# Patient Record
Sex: Female | Born: 1940 | Race: Black or African American | Hispanic: No | Smoking: Former smoker
Health system: Southern US, Community
[De-identification: ages and names within clinical notes are randomized; demographics above are authoritative.]

## PROBLEM LIST (undated history)

## (undated) DIAGNOSIS — E669 Obesity, unspecified: Secondary | ICD-10-CM

## (undated) DIAGNOSIS — G4730 Sleep apnea, unspecified: Secondary | ICD-10-CM

## (undated) DIAGNOSIS — Z860100 Personal history of colon polyps, unspecified: Secondary | ICD-10-CM

## (undated) DIAGNOSIS — I8390 Asymptomatic varicose veins of unspecified lower extremity: Secondary | ICD-10-CM

## (undated) DIAGNOSIS — E7800 Pure hypercholesterolemia, unspecified: Secondary | ICD-10-CM

## (undated) DIAGNOSIS — I10 Essential (primary) hypertension: Secondary | ICD-10-CM

## (undated) DIAGNOSIS — R059 Cough, unspecified: Secondary | ICD-10-CM

## (undated) DIAGNOSIS — Q0700 Arnold-Chiari syndrome without spina bifida or hydrocephalus: Secondary | ICD-10-CM

## (undated) DIAGNOSIS — R05 Cough: Secondary | ICD-10-CM

## (undated) DIAGNOSIS — I2510 Atherosclerotic heart disease of native coronary artery without angina pectoris: Secondary | ICD-10-CM

## (undated) DIAGNOSIS — K449 Diaphragmatic hernia without obstruction or gangrene: Secondary | ICD-10-CM

## (undated) DIAGNOSIS — K219 Gastro-esophageal reflux disease without esophagitis: Secondary | ICD-10-CM

## (undated) DIAGNOSIS — J449 Chronic obstructive pulmonary disease, unspecified: Secondary | ICD-10-CM

## (undated) DIAGNOSIS — J45909 Unspecified asthma, uncomplicated: Secondary | ICD-10-CM

## (undated) DIAGNOSIS — R7611 Nonspecific reaction to tuberculin skin test without active tuberculosis: Secondary | ICD-10-CM

## (undated) DIAGNOSIS — I872 Venous insufficiency (chronic) (peripheral): Secondary | ICD-10-CM

## (undated) DIAGNOSIS — M1990 Unspecified osteoarthritis, unspecified site: Secondary | ICD-10-CM

## (undated) DIAGNOSIS — Z8669 Personal history of other diseases of the nervous system and sense organs: Secondary | ICD-10-CM

## (undated) HISTORY — DX: Unspecified osteoarthritis, unspecified site: M19.90

## (undated) HISTORY — PX: ABDOMINAL HYSTERECTOMY: SHX81

## (undated) HISTORY — DX: Arnold-Chiari syndrome without spina bifida or hydrocephalus: Q07.00

## (undated) HISTORY — DX: Gastro-esophageal reflux disease without esophagitis: K21.9

## (undated) HISTORY — DX: Chronic obstructive pulmonary disease, unspecified: J44.9

## (undated) HISTORY — DX: Asymptomatic varicose veins of unspecified lower extremity: I83.90

## (undated) HISTORY — DX: Pure hypercholesterolemia, unspecified: E78.00

## (undated) HISTORY — DX: Sleep apnea, unspecified: G47.30

## (undated) HISTORY — DX: Personal history of other diseases of the nervous system and sense organs: Z86.69

## (undated) HISTORY — DX: Nonspecific reaction to tuberculin skin test without active tuberculosis: R76.11

## (undated) HISTORY — DX: Obesity, unspecified: E66.9

## (undated) HISTORY — DX: Personal history of colon polyps, unspecified: Z86.0100

## (undated) HISTORY — DX: Personal history of colonic polyps: Z86.010

## (undated) HISTORY — PX: TONSILLECTOMY: SUR1361

## (undated) HISTORY — DX: Venous insufficiency (chronic) (peripheral): I87.2

## (undated) HISTORY — DX: Atherosclerotic heart disease of native coronary artery without angina pectoris: I25.10

## (undated) HISTORY — DX: Essential (primary) hypertension: I10

## (undated) HISTORY — DX: Unspecified asthma, uncomplicated: J45.909

---

## 1991-12-02 HISTORY — PX: OTHER SURGICAL HISTORY: SHX169

## 1998-02-20 ENCOUNTER — Ambulatory Visit (HOSPITAL_COMMUNITY): Admission: RE | Admit: 1998-02-20 | Discharge: 1998-02-20 | Payer: Self-pay | Admitting: Pulmonary Disease

## 1998-04-07 ENCOUNTER — Emergency Department (HOSPITAL_COMMUNITY): Admission: EM | Admit: 1998-04-07 | Discharge: 1998-04-07 | Payer: Self-pay | Admitting: Emergency Medicine

## 1998-12-24 ENCOUNTER — Other Ambulatory Visit: Admission: RE | Admit: 1998-12-24 | Discharge: 1998-12-24 | Payer: Self-pay | Admitting: Obstetrics and Gynecology

## 1999-02-25 ENCOUNTER — Ambulatory Visit (HOSPITAL_COMMUNITY): Admission: RE | Admit: 1999-02-25 | Discharge: 1999-02-25 | Payer: Self-pay | Admitting: Pulmonary Disease

## 2000-01-20 ENCOUNTER — Other Ambulatory Visit: Admission: RE | Admit: 2000-01-20 | Discharge: 2000-01-20 | Payer: Self-pay | Admitting: Obstetrics and Gynecology

## 2000-02-27 ENCOUNTER — Encounter: Payer: Self-pay | Admitting: Obstetrics and Gynecology

## 2000-02-27 ENCOUNTER — Ambulatory Visit (HOSPITAL_COMMUNITY): Admission: RE | Admit: 2000-02-27 | Discharge: 2000-02-27 | Payer: Self-pay | Admitting: Obstetrics and Gynecology

## 2001-01-25 ENCOUNTER — Other Ambulatory Visit: Admission: RE | Admit: 2001-01-25 | Discharge: 2001-01-25 | Payer: Self-pay | Admitting: Obstetrics and Gynecology

## 2001-03-29 ENCOUNTER — Encounter: Payer: Self-pay | Admitting: Specialist

## 2001-04-05 ENCOUNTER — Ambulatory Visit (HOSPITAL_COMMUNITY): Admission: RE | Admit: 2001-04-05 | Discharge: 2001-04-05 | Payer: Self-pay | Admitting: Specialist

## 2001-05-12 ENCOUNTER — Ambulatory Visit (HOSPITAL_COMMUNITY): Admission: RE | Admit: 2001-05-12 | Discharge: 2001-05-12 | Payer: Self-pay | Admitting: Obstetrics and Gynecology

## 2001-05-12 ENCOUNTER — Encounter: Payer: Self-pay | Admitting: Obstetrics and Gynecology

## 2002-01-27 ENCOUNTER — Other Ambulatory Visit: Admission: RE | Admit: 2002-01-27 | Discharge: 2002-01-27 | Payer: Self-pay | Admitting: Obstetrics and Gynecology

## 2002-05-16 ENCOUNTER — Encounter: Payer: Self-pay | Admitting: Obstetrics and Gynecology

## 2002-05-16 ENCOUNTER — Ambulatory Visit (HOSPITAL_COMMUNITY): Admission: RE | Admit: 2002-05-16 | Discharge: 2002-05-16 | Payer: Self-pay | Admitting: Obstetrics and Gynecology

## 2004-01-17 ENCOUNTER — Inpatient Hospital Stay (HOSPITAL_COMMUNITY): Admission: AD | Admit: 2004-01-17 | Discharge: 2004-01-19 | Payer: Self-pay | Admitting: *Deleted

## 2004-10-01 ENCOUNTER — Ambulatory Visit: Payer: Self-pay | Admitting: *Deleted

## 2004-11-20 ENCOUNTER — Ambulatory Visit: Payer: Self-pay | Admitting: *Deleted

## 2004-11-29 ENCOUNTER — Ambulatory Visit: Payer: Self-pay | Admitting: Pulmonary Disease

## 2004-12-03 ENCOUNTER — Ambulatory Visit: Payer: Self-pay | Admitting: *Deleted

## 2004-12-17 ENCOUNTER — Ambulatory Visit: Payer: Self-pay | Admitting: *Deleted

## 2005-03-18 ENCOUNTER — Ambulatory Visit: Payer: Self-pay | Admitting: Pulmonary Disease

## 2005-06-24 ENCOUNTER — Ambulatory Visit: Payer: Self-pay | Admitting: Pulmonary Disease

## 2005-06-26 ENCOUNTER — Ambulatory Visit: Payer: Self-pay | Admitting: Internal Medicine

## 2005-07-22 ENCOUNTER — Ambulatory Visit: Payer: Self-pay | Admitting: *Deleted

## 2005-07-30 ENCOUNTER — Encounter: Admission: RE | Admit: 2005-07-30 | Discharge: 2005-07-30 | Payer: Self-pay | Admitting: Surgery

## 2005-08-11 ENCOUNTER — Ambulatory Visit: Payer: Self-pay

## 2005-08-18 ENCOUNTER — Ambulatory Visit: Payer: Self-pay

## 2005-09-15 ENCOUNTER — Ambulatory Visit: Payer: Self-pay | Admitting: Pulmonary Disease

## 2006-01-14 ENCOUNTER — Ambulatory Visit: Payer: Self-pay | Admitting: *Deleted

## 2006-01-19 ENCOUNTER — Ambulatory Visit: Payer: Self-pay | Admitting: *Deleted

## 2006-01-19 ENCOUNTER — Ambulatory Visit: Payer: Self-pay | Admitting: Pulmonary Disease

## 2006-03-10 ENCOUNTER — Ambulatory Visit: Payer: Self-pay | Admitting: *Deleted

## 2006-05-07 ENCOUNTER — Encounter: Admission: RE | Admit: 2006-05-07 | Discharge: 2006-05-07 | Payer: Self-pay | Admitting: Occupational Medicine

## 2006-05-08 ENCOUNTER — Ambulatory Visit: Payer: Self-pay | Admitting: *Deleted

## 2006-06-22 ENCOUNTER — Ambulatory Visit: Payer: Self-pay | Admitting: Pulmonary Disease

## 2006-07-15 ENCOUNTER — Ambulatory Visit: Payer: Self-pay | Admitting: *Deleted

## 2006-07-23 ENCOUNTER — Emergency Department (HOSPITAL_COMMUNITY): Admission: EM | Admit: 2006-07-23 | Discharge: 2006-07-23 | Payer: Self-pay | Admitting: Emergency Medicine

## 2006-10-20 ENCOUNTER — Ambulatory Visit: Payer: Self-pay | Admitting: Pulmonary Disease

## 2006-12-31 ENCOUNTER — Ambulatory Visit: Payer: Self-pay | Admitting: Pulmonary Disease

## 2007-03-31 ENCOUNTER — Ambulatory Visit: Payer: Self-pay | Admitting: Pulmonary Disease

## 2007-04-05 ENCOUNTER — Ambulatory Visit: Payer: Self-pay | Admitting: Pulmonary Disease

## 2007-04-05 LAB — CONVERTED CEMR LAB
ALT: 17 units/L (ref 0–40)
AST: 21 units/L (ref 0–37)
Albumin: 3.1 g/dL — ABNORMAL LOW (ref 3.5–5.2)
Alkaline Phosphatase: 79 units/L (ref 39–117)
BUN: 7 mg/dL (ref 6–23)
Bacteria, UA: NEGATIVE
Basophils Absolute: 0 10*3/uL (ref 0.0–0.1)
Basophils Relative: 0.4 % (ref 0.0–1.0)
Bilirubin, Direct: 0.2 mg/dL (ref 0.0–0.3)
CO2: 30 meq/L (ref 19–32)
Calcium: 9 mg/dL (ref 8.4–10.5)
Chloride: 109 meq/L (ref 96–112)
Cholesterol: 115 mg/dL (ref 0–200)
Creatinine, Ser: 0.7 mg/dL (ref 0.4–1.2)
Crystals: NEGATIVE
Eosinophils Absolute: 0.1 10*3/uL (ref 0.0–0.6)
Eosinophils Relative: 2.3 % (ref 0.0–5.0)
GFR calc Af Amer: 108 mL/min
GFR calc non Af Amer: 90 mL/min
Glucose, Bld: 90 mg/dL (ref 70–99)
HCT: 36.1 % (ref 36.0–46.0)
HDL: 49.5 mg/dL (ref >39.0)
Hemoglobin, Urine: NEGATIVE
Hemoglobin: 12 g/dL (ref 12.0–15.0)
Ketones, ur: NEGATIVE mg/dL
LDL Cholesterol: 49 mg/dL (ref 0–99)
Lymphocytes Relative: 43.7 % (ref 12.0–46.0)
MCHC: 33.3 g/dL (ref 30.0–36.0)
MCV: 79.6 fL (ref 78.0–100.0)
Monocytes Absolute: 0.4 10*3/uL (ref 0.2–0.7)
Monocytes Relative: 10.4 % (ref 3.0–11.0)
Mucus, UA: NEGATIVE
Neutro Abs: 1.9 10*3/uL (ref 1.4–7.7)
Neutrophils Relative %: 43.2 % (ref 43.0–77.0)
Nitrite: NEGATIVE
Platelets: 209 10*3/uL (ref 150–400)
Potassium: 4.1 meq/L (ref 3.5–5.1)
RBC: 4.53 M/uL (ref 3.87–5.11)
RDW: 13.6 % (ref 11.5–14.6)
Sodium: 146 meq/L — ABNORMAL HIGH (ref 135–145)
Specific Gravity, Urine: 1.02 (ref 1.000–1.03)
TSH: 2.46 microintl units/mL (ref 0.35–5.50)
Total Bilirubin: 0.5 mg/dL (ref 0.3–1.2)
Total CHOL/HDL Ratio: 2.3
Total Protein, Urine: NEGATIVE mg/dL
Total Protein: 6.4 g/dL (ref 6.0–8.3)
Triglycerides: 83 mg/dL (ref 0–149)
Urine Glucose: NEGATIVE mg/dL
Urobilinogen, UA: 0.2 (ref 0.0–1.0)
VLDL: 17 mg/dL (ref 0–40)
WBC: 4.3 10*3/uL — ABNORMAL LOW (ref 4.5–10.5)
pH: 6 (ref 5.0–8.0)

## 2007-09-27 ENCOUNTER — Ambulatory Visit: Payer: Self-pay | Admitting: Pulmonary Disease

## 2007-11-01 ENCOUNTER — Ambulatory Visit: Payer: Self-pay | Admitting: Vascular Surgery

## 2007-12-02 HISTORY — PX: OTHER SURGICAL HISTORY: SHX169

## 2007-12-06 ENCOUNTER — Encounter (INDEPENDENT_AMBULATORY_CARE_PROVIDER_SITE_OTHER): Payer: Self-pay | Admitting: Specialist

## 2007-12-06 ENCOUNTER — Ambulatory Visit (HOSPITAL_BASED_OUTPATIENT_CLINIC_OR_DEPARTMENT_OTHER): Admission: RE | Admit: 2007-12-06 | Discharge: 2007-12-06 | Payer: Self-pay | Admitting: Specialist

## 2007-12-27 ENCOUNTER — Ambulatory Visit: Payer: Self-pay | Admitting: Cardiology

## 2008-01-05 ENCOUNTER — Ambulatory Visit: Payer: Self-pay | Admitting: Cardiology

## 2008-01-05 LAB — CONVERTED CEMR LAB
BUN: 9 mg/dL (ref 6–23)
CO2: 31 meq/L (ref 19–32)
Calcium: 9.4 mg/dL (ref 8.4–10.5)
Chloride: 102 meq/L (ref 96–112)
Creatinine, Ser: 0.7 mg/dL (ref 0.4–1.2)
GFR calc Af Amer: 108 mL/min
GFR calc non Af Amer: 90 mL/min
Glucose, Bld: 95 mg/dL (ref 70–99)
Potassium: 4.1 meq/L (ref 3.5–5.1)
Sodium: 140 meq/L (ref 135–145)

## 2008-01-14 DIAGNOSIS — M545 Low back pain, unspecified: Secondary | ICD-10-CM | POA: Insufficient documentation

## 2008-01-14 DIAGNOSIS — E669 Obesity, unspecified: Secondary | ICD-10-CM | POA: Insufficient documentation

## 2008-01-14 DIAGNOSIS — Z8669 Personal history of other diseases of the nervous system and sense organs: Secondary | ICD-10-CM | POA: Insufficient documentation

## 2008-01-14 DIAGNOSIS — D126 Benign neoplasm of colon, unspecified: Secondary | ICD-10-CM | POA: Insufficient documentation

## 2008-01-14 DIAGNOSIS — E7800 Pure hypercholesterolemia, unspecified: Secondary | ICD-10-CM | POA: Insufficient documentation

## 2008-01-14 DIAGNOSIS — Z6836 Body mass index (BMI) 36.0-36.9, adult: Secondary | ICD-10-CM | POA: Insufficient documentation

## 2008-01-14 DIAGNOSIS — I2510 Atherosclerotic heart disease of native coronary artery without angina pectoris: Secondary | ICD-10-CM | POA: Insufficient documentation

## 2008-01-14 DIAGNOSIS — K219 Gastro-esophageal reflux disease without esophagitis: Secondary | ICD-10-CM | POA: Insufficient documentation

## 2008-01-14 DIAGNOSIS — M1990 Unspecified osteoarthritis, unspecified site: Secondary | ICD-10-CM | POA: Insufficient documentation

## 2008-01-14 DIAGNOSIS — G9050 Complex regional pain syndrome I, unspecified: Secondary | ICD-10-CM | POA: Insufficient documentation

## 2008-01-14 DIAGNOSIS — I872 Venous insufficiency (chronic) (peripheral): Secondary | ICD-10-CM | POA: Insufficient documentation

## 2008-01-17 ENCOUNTER — Ambulatory Visit: Payer: Self-pay | Admitting: Pulmonary Disease

## 2008-01-17 DIAGNOSIS — I10 Essential (primary) hypertension: Secondary | ICD-10-CM | POA: Insufficient documentation

## 2008-02-11 ENCOUNTER — Ambulatory Visit: Payer: Self-pay

## 2008-02-11 LAB — CONVERTED CEMR LAB
ALT: 22 units/L (ref 0–35)
AST: 30 units/L (ref 0–37)
Albumin: 3.2 g/dL — ABNORMAL LOW (ref 3.5–5.2)
Alkaline Phosphatase: 79 units/L (ref 39–117)
Bilirubin, Direct: 0.2 mg/dL (ref 0.0–0.3)
Cholesterol: 126 mg/dL (ref 0–200)
HDL: 56.4 mg/dL (ref >39.0)
LDL Cholesterol: 49 mg/dL (ref 0–99)
Total Bilirubin: 0.5 mg/dL (ref 0.3–1.2)
Total CHOL/HDL Ratio: 2.2
Total Protein: 6.6 g/dL (ref 6.0–8.3)
Triglycerides: 104 mg/dL (ref 0–149)
VLDL: 21 mg/dL (ref 0–40)

## 2008-02-16 ENCOUNTER — Telehealth (INDEPENDENT_AMBULATORY_CARE_PROVIDER_SITE_OTHER): Payer: Self-pay | Admitting: *Deleted

## 2008-02-21 ENCOUNTER — Ambulatory Visit: Payer: Self-pay | Admitting: Cardiology

## 2008-02-23 ENCOUNTER — Encounter: Payer: Self-pay | Admitting: Pulmonary Disease

## 2008-02-23 ENCOUNTER — Encounter (INDEPENDENT_AMBULATORY_CARE_PROVIDER_SITE_OTHER): Payer: Self-pay | Admitting: *Deleted

## 2008-03-27 ENCOUNTER — Telehealth: Payer: Self-pay | Admitting: Pulmonary Disease

## 2008-03-31 ENCOUNTER — Ambulatory Visit: Payer: Self-pay | Admitting: Pulmonary Disease

## 2008-04-09 DIAGNOSIS — Q054 Unspecified spina bifida with hydrocephalus: Secondary | ICD-10-CM | POA: Insufficient documentation

## 2008-04-10 ENCOUNTER — Telehealth (INDEPENDENT_AMBULATORY_CARE_PROVIDER_SITE_OTHER): Payer: Self-pay | Admitting: *Deleted

## 2008-05-02 ENCOUNTER — Emergency Department (HOSPITAL_COMMUNITY): Admission: EM | Admit: 2008-05-02 | Discharge: 2008-05-02 | Payer: Self-pay | Admitting: Emergency Medicine

## 2008-05-17 ENCOUNTER — Ambulatory Visit: Payer: Self-pay | Admitting: Pulmonary Disease

## 2008-05-22 ENCOUNTER — Encounter: Payer: Self-pay | Admitting: Pulmonary Disease

## 2008-05-31 HISTORY — PX: OTHER SURGICAL HISTORY: SHX169

## 2008-06-12 ENCOUNTER — Inpatient Hospital Stay (HOSPITAL_COMMUNITY): Admission: RE | Admit: 2008-06-12 | Discharge: 2008-06-16 | Payer: Self-pay | Admitting: Specialist

## 2008-07-21 ENCOUNTER — Ambulatory Visit: Payer: Self-pay | Admitting: Pulmonary Disease

## 2008-07-21 ENCOUNTER — Inpatient Hospital Stay (HOSPITAL_COMMUNITY): Admission: EM | Admit: 2008-07-21 | Discharge: 2008-07-24 | Payer: Self-pay | Admitting: Emergency Medicine

## 2008-07-24 ENCOUNTER — Telehealth: Payer: Self-pay | Admitting: Pulmonary Disease

## 2008-07-25 ENCOUNTER — Telehealth (INDEPENDENT_AMBULATORY_CARE_PROVIDER_SITE_OTHER): Payer: Self-pay | Admitting: *Deleted

## 2008-07-28 ENCOUNTER — Telehealth (INDEPENDENT_AMBULATORY_CARE_PROVIDER_SITE_OTHER): Payer: Self-pay | Admitting: *Deleted

## 2008-07-31 ENCOUNTER — Telehealth (INDEPENDENT_AMBULATORY_CARE_PROVIDER_SITE_OTHER): Payer: Self-pay | Admitting: *Deleted

## 2008-08-01 ENCOUNTER — Telehealth: Payer: Self-pay | Admitting: Pulmonary Disease

## 2008-08-08 ENCOUNTER — Telehealth (INDEPENDENT_AMBULATORY_CARE_PROVIDER_SITE_OTHER): Payer: Self-pay | Admitting: *Deleted

## 2008-08-15 ENCOUNTER — Ambulatory Visit: Payer: Self-pay | Admitting: Pulmonary Disease

## 2008-08-23 ENCOUNTER — Encounter: Payer: Self-pay | Admitting: Pulmonary Disease

## 2008-09-05 ENCOUNTER — Encounter (INDEPENDENT_AMBULATORY_CARE_PROVIDER_SITE_OTHER): Payer: Self-pay | Admitting: Specialist

## 2008-09-05 ENCOUNTER — Ambulatory Visit (HOSPITAL_COMMUNITY): Admission: RE | Admit: 2008-09-05 | Discharge: 2008-09-05 | Payer: Self-pay | Admitting: Specialist

## 2008-09-05 ENCOUNTER — Ambulatory Visit: Payer: Self-pay | Admitting: Vascular Surgery

## 2008-09-18 ENCOUNTER — Telehealth (INDEPENDENT_AMBULATORY_CARE_PROVIDER_SITE_OTHER): Payer: Self-pay | Admitting: *Deleted

## 2008-09-18 ENCOUNTER — Ambulatory Visit: Payer: Self-pay | Admitting: Pulmonary Disease

## 2008-09-21 ENCOUNTER — Encounter: Payer: Self-pay | Admitting: Pulmonary Disease

## 2008-11-01 ENCOUNTER — Telehealth (INDEPENDENT_AMBULATORY_CARE_PROVIDER_SITE_OTHER): Payer: Self-pay | Admitting: *Deleted

## 2008-11-02 ENCOUNTER — Ambulatory Visit: Payer: Self-pay | Admitting: Pulmonary Disease

## 2008-11-02 DIAGNOSIS — R252 Cramp and spasm: Secondary | ICD-10-CM | POA: Insufficient documentation

## 2008-11-07 ENCOUNTER — Telehealth (INDEPENDENT_AMBULATORY_CARE_PROVIDER_SITE_OTHER): Payer: Self-pay | Admitting: *Deleted

## 2008-11-13 LAB — CONVERTED CEMR LAB
BUN: 11 mg/dL (ref 6–23)
CO2: 31 meq/L (ref 19–32)
Calcium: 9.1 mg/dL (ref 8.4–10.5)
Chloride: 108 meq/L (ref 96–112)
Creatinine, Ser: 0.7 mg/dL (ref 0.4–1.2)
GFR calc Af Amer: 108 mL/min
GFR calc non Af Amer: 89 mL/min
Glucose, Bld: 86 mg/dL (ref 70–99)
Potassium: 4.5 meq/L (ref 3.5–5.1)
Sodium: 143 meq/L (ref 135–145)

## 2008-12-19 ENCOUNTER — Ambulatory Visit: Payer: Self-pay | Admitting: Pulmonary Disease

## 2009-02-12 ENCOUNTER — Telehealth (INDEPENDENT_AMBULATORY_CARE_PROVIDER_SITE_OTHER): Payer: Self-pay | Admitting: *Deleted

## 2009-02-21 ENCOUNTER — Ambulatory Visit: Payer: Self-pay | Admitting: Cardiology

## 2009-02-21 ENCOUNTER — Encounter: Payer: Self-pay | Admitting: Cardiology

## 2009-02-21 LAB — CONVERTED CEMR LAB
ALT: 22 units/L (ref 0–35)
AST: 23 units/L (ref 0–37)
Albumin: 3 g/dL — ABNORMAL LOW (ref 3.5–5.2)
Alkaline Phosphatase: 85 units/L (ref 39–117)
BUN: 8 mg/dL (ref 6–23)
Bilirubin, Direct: 0 mg/dL (ref 0.0–0.3)
CO2: 31 meq/L (ref 19–32)
Calcium: 8.8 mg/dL (ref 8.4–10.5)
Chloride: 108 meq/L (ref 96–112)
Cholesterol: 152 mg/dL (ref 0–200)
Creatinine, Ser: 0.6 mg/dL (ref 0.4–1.2)
GFR calc non Af Amer: 128.6 mL/min (ref >60)
Glucose, Bld: 87 mg/dL (ref 70–99)
HDL: 77.3 mg/dL (ref >39.00)
LDL Cholesterol: 55 mg/dL (ref 0–99)
Potassium: 4.1 meq/L (ref 3.5–5.1)
Sodium: 144 meq/L (ref 135–145)
Total Bilirubin: 0.6 mg/dL (ref 0.3–1.2)
Total CHOL/HDL Ratio: 2
Total Protein: 6.5 g/dL (ref 6.0–8.3)
Triglycerides: 97 mg/dL (ref 0.0–149.0)
VLDL: 19.4 mg/dL (ref 0.0–40.0)

## 2009-04-18 ENCOUNTER — Ambulatory Visit: Payer: Self-pay | Admitting: Pulmonary Disease

## 2009-04-18 DIAGNOSIS — J449 Chronic obstructive pulmonary disease, unspecified: Secondary | ICD-10-CM | POA: Insufficient documentation

## 2009-04-19 DIAGNOSIS — J309 Allergic rhinitis, unspecified: Secondary | ICD-10-CM | POA: Insufficient documentation

## 2009-05-15 ENCOUNTER — Encounter: Payer: Self-pay | Admitting: Pulmonary Disease

## 2009-05-28 ENCOUNTER — Encounter: Payer: Self-pay | Admitting: Pulmonary Disease

## 2009-09-04 ENCOUNTER — Ambulatory Visit: Payer: Self-pay | Admitting: Pulmonary Disease

## 2009-09-05 ENCOUNTER — Telehealth (INDEPENDENT_AMBULATORY_CARE_PROVIDER_SITE_OTHER): Payer: Self-pay | Admitting: *Deleted

## 2009-11-01 ENCOUNTER — Telehealth (INDEPENDENT_AMBULATORY_CARE_PROVIDER_SITE_OTHER): Payer: Self-pay | Admitting: *Deleted

## 2009-11-01 ENCOUNTER — Ambulatory Visit: Payer: Self-pay | Admitting: Pulmonary Disease

## 2009-11-01 ENCOUNTER — Telehealth: Payer: Self-pay | Admitting: Cardiology

## 2009-11-01 DIAGNOSIS — I498 Other specified cardiac arrhythmias: Secondary | ICD-10-CM | POA: Insufficient documentation

## 2009-11-01 DIAGNOSIS — R001 Bradycardia, unspecified: Secondary | ICD-10-CM | POA: Insufficient documentation

## 2009-11-01 DIAGNOSIS — I82409 Acute embolism and thrombosis of unspecified deep veins of unspecified lower extremity: Secondary | ICD-10-CM | POA: Insufficient documentation

## 2009-11-01 DIAGNOSIS — R079 Chest pain, unspecified: Secondary | ICD-10-CM | POA: Insufficient documentation

## 2009-11-02 ENCOUNTER — Telehealth: Payer: Self-pay | Admitting: Pulmonary Disease

## 2009-11-02 ENCOUNTER — Ambulatory Visit: Payer: Self-pay | Admitting: Cardiology

## 2009-11-02 DIAGNOSIS — R011 Cardiac murmur, unspecified: Secondary | ICD-10-CM | POA: Insufficient documentation

## 2009-11-15 ENCOUNTER — Telehealth (INDEPENDENT_AMBULATORY_CARE_PROVIDER_SITE_OTHER): Payer: Self-pay | Admitting: *Deleted

## 2009-11-20 ENCOUNTER — Ambulatory Visit: Payer: Self-pay

## 2009-11-20 ENCOUNTER — Ambulatory Visit: Payer: Self-pay | Admitting: Cardiology

## 2009-11-20 ENCOUNTER — Encounter: Payer: Self-pay | Admitting: Cardiology

## 2009-11-20 ENCOUNTER — Ambulatory Visit (HOSPITAL_COMMUNITY): Admission: RE | Admit: 2009-11-20 | Discharge: 2009-11-20 | Payer: Self-pay | Admitting: Cardiology

## 2009-11-20 ENCOUNTER — Encounter (HOSPITAL_COMMUNITY): Admission: RE | Admit: 2009-11-20 | Discharge: 2009-11-27 | Payer: Self-pay | Admitting: Cardiology

## 2009-11-21 LAB — CONVERTED CEMR LAB
ALT: 15 units/L (ref 0–35)
AST: 23 units/L (ref 0–37)
Albumin: 3.3 g/dL — ABNORMAL LOW (ref 3.5–5.2)
Alkaline Phosphatase: 95 units/L (ref 39–117)
BUN: 7 mg/dL (ref 6–23)
Bilirubin, Direct: 0 mg/dL (ref 0.0–0.3)
CO2: 29 meq/L (ref 19–32)
Calcium: 8.9 mg/dL (ref 8.4–10.5)
Chloride: 105 meq/L (ref 96–112)
Cholesterol: 127 mg/dL (ref 0–200)
Creatinine, Ser: 0.7 mg/dL (ref 0.4–1.2)
GFR calc non Af Amer: 107.39 mL/min (ref >60)
Glucose, Bld: 98 mg/dL (ref 70–99)
HDL: 52.4 mg/dL (ref >39.00)
LDL Cholesterol: 55 mg/dL (ref 0–99)
Potassium: 4.3 meq/L (ref 3.5–5.1)
Sodium: 142 meq/L (ref 135–145)
Total Bilirubin: 0.5 mg/dL (ref 0.3–1.2)
Total CHOL/HDL Ratio: 2
Total Protein: 6.8 g/dL (ref 6.0–8.3)
Triglycerides: 100 mg/dL (ref 0.0–149.0)
VLDL: 20 mg/dL (ref 0.0–40.0)

## 2009-12-10 ENCOUNTER — Ambulatory Visit: Payer: Self-pay | Admitting: Pulmonary Disease

## 2010-01-15 ENCOUNTER — Encounter: Admission: RE | Admit: 2010-01-15 | Discharge: 2010-01-15 | Payer: Self-pay | Admitting: Infectious Diseases

## 2010-02-22 ENCOUNTER — Telehealth (INDEPENDENT_AMBULATORY_CARE_PROVIDER_SITE_OTHER): Payer: Self-pay | Admitting: *Deleted

## 2010-02-22 ENCOUNTER — Ambulatory Visit: Payer: Self-pay | Admitting: Internal Medicine

## 2010-02-26 ENCOUNTER — Telehealth (INDEPENDENT_AMBULATORY_CARE_PROVIDER_SITE_OTHER): Payer: Self-pay | Admitting: *Deleted

## 2010-02-27 ENCOUNTER — Ambulatory Visit: Payer: Self-pay | Admitting: Pulmonary Disease

## 2010-03-14 ENCOUNTER — Telehealth: Payer: Self-pay | Admitting: Adult Health

## 2010-05-17 ENCOUNTER — Telehealth (INDEPENDENT_AMBULATORY_CARE_PROVIDER_SITE_OTHER): Payer: Self-pay | Admitting: *Deleted

## 2010-05-31 ENCOUNTER — Encounter: Payer: Self-pay | Admitting: Pulmonary Disease

## 2010-06-10 ENCOUNTER — Ambulatory Visit: Payer: Self-pay | Admitting: Pulmonary Disease

## 2010-06-11 DIAGNOSIS — F17200 Nicotine dependence, unspecified, uncomplicated: Secondary | ICD-10-CM | POA: Insufficient documentation

## 2010-06-25 ENCOUNTER — Ambulatory Visit: Payer: Self-pay | Admitting: Cardiology

## 2010-08-13 ENCOUNTER — Telehealth (INDEPENDENT_AMBULATORY_CARE_PROVIDER_SITE_OTHER): Payer: Self-pay | Admitting: *Deleted

## 2010-08-13 ENCOUNTER — Telehealth: Payer: Self-pay | Admitting: Pulmonary Disease

## 2010-08-13 DIAGNOSIS — N950 Postmenopausal bleeding: Secondary | ICD-10-CM | POA: Insufficient documentation

## 2010-09-20 ENCOUNTER — Ambulatory Visit: Payer: Self-pay | Admitting: Pulmonary Disease

## 2010-09-23 ENCOUNTER — Ambulatory Visit: Payer: Self-pay | Admitting: Pulmonary Disease

## 2010-10-07 LAB — CONVERTED CEMR LAB
ALT: 17 units/L (ref 0–35)
AST: 21 units/L (ref 0–37)
Albumin: 3.3 g/dL — ABNORMAL LOW (ref 3.5–5.2)
Alkaline Phosphatase: 96 units/L (ref 39–117)
BUN: 10 mg/dL (ref 6–23)
Basophils Absolute: 0 10*3/uL (ref 0.0–0.1)
Basophils Relative: 0.5 % (ref 0.0–3.0)
Bilirubin, Direct: 0.1 mg/dL (ref 0.0–0.3)
CO2: 28 meq/L (ref 19–32)
Calcium: 8.9 mg/dL (ref 8.4–10.5)
Chloride: 104 meq/L (ref 96–112)
Cholesterol: 143 mg/dL (ref 0–200)
Creatinine, Ser: 0.7 mg/dL (ref 0.4–1.2)
Eosinophils Absolute: 0.1 10*3/uL (ref 0.0–0.7)
Eosinophils Relative: 3.6 % (ref 0.0–5.0)
GFR calc non Af Amer: 114.65 mL/min (ref >60)
Glucose, Bld: 84 mg/dL (ref 70–99)
HCT: 36.3 % (ref 36.0–46.0)
HDL: 50.6 mg/dL (ref >39.00)
Hemoglobin: 12 g/dL (ref 12.0–15.0)
LDL Cholesterol: 67 mg/dL (ref 0–99)
Lymphocytes Relative: 51.2 % — ABNORMAL HIGH (ref 12.0–46.0)
Lymphs Abs: 2 10*3/uL (ref 0.7–4.0)
MCHC: 33.1 g/dL (ref 30.0–36.0)
MCV: 81.9 fL (ref 78.0–100.0)
Monocytes Absolute: 0.4 10*3/uL (ref 0.1–1.0)
Monocytes Relative: 10.5 % (ref 3.0–12.0)
Neutro Abs: 1.3 10*3/uL — ABNORMAL LOW (ref 1.4–7.7)
Neutrophils Relative %: 34.2 % — ABNORMAL LOW (ref 43.0–77.0)
Platelets: 188 10*3/uL (ref 150.0–400.0)
Potassium: 4.4 meq/L (ref 3.5–5.1)
RBC: 4.44 M/uL (ref 3.87–5.11)
RDW: 15.4 % — ABNORMAL HIGH (ref 11.5–14.6)
Sodium: 140 meq/L (ref 135–145)
TSH: 2.84 microintl units/mL (ref 0.35–5.50)
Total Bilirubin: 0.4 mg/dL (ref 0.3–1.2)
Total CHOL/HDL Ratio: 3
Total Protein: 6.6 g/dL (ref 6.0–8.3)
Triglycerides: 125 mg/dL (ref 0.0–149.0)
VLDL: 25 mg/dL (ref 0.0–40.0)
WBC: 3.9 10*3/uL — ABNORMAL LOW (ref 4.5–10.5)

## 2010-10-28 ENCOUNTER — Telehealth (INDEPENDENT_AMBULATORY_CARE_PROVIDER_SITE_OTHER): Payer: Self-pay | Admitting: *Deleted

## 2010-11-26 ENCOUNTER — Telehealth: Payer: Self-pay | Admitting: Pulmonary Disease

## 2010-12-31 NOTE — Miscellaneous (Signed)
Summary: Orders Update  Clinical Lists Changes  Orders: Added new Service order of No Charge Patient Arrived (NCPA0) (NCPA0) - Signed 

## 2010-12-31 NOTE — Assessment & Plan Note (Signed)
Summary: PAIN IN LEG AND ARM/ MBW  Medications Added PROAIR HFA 108 (90 BASE) MCG/ACT  AERS (ALBUTEROL SULFATE) 1-2 sprays every 6 hours as needed for wheezing... LISINOPRIL-HYDROCHLOROTHIAZIDE 20-12.5 MG  TABS (LISINOPRIL-HYDROCHLOROTHIAZIDE) Take 1 tablet by mouth once a day POTASSIUM CHLORIDE CRYS CR 20 MEQ  TBCR (POTASSIUM CHLORIDE CRYS CR) 1 tab daily as directed... CRESTOR 5 MG  TABS (ROSUVASTATIN CALCIUM) Take 1 tablet by mouth once a day ETODOLAC 400 MG  TABS (ETODOLAC) 1 tab by mouth two times a day w/ food as needed for arthritis pain... HYDROCODONE-ACETAMINOPHEN 5-325 MG  TABS (HYDROCODONE-ACETAMINOPHEN) 1 tab by mouth Q4H as needed for pain...      Allergies Added: NKDA  Chief Complaint:  4 month ROV....  History of Present Illness: 70 y/o BF here for a follow up visit... she underwent a right knee arthroscopy 12/06/07 by Tora Perches and has some rough going after surg... she's been getting therapy at home and walking w/ crutches and notes some right should discomfort now... she will be seeing DrBeane tomorrow and will mention the shoulder pain to him for Rx...  she also saw DrCrenshaw on 12/27/07: she has non-obstructive CAD and her BP was sl elevated prompting him to start LISINOPRIL/Hct 20/12.5 daily... she was also instructed to stop her KCl but she started cramping in the muscles of her legs so she restarted the potassium...    Current Problems:  HYPERTENSION (ICD-401.9) - started on LISINOPRIL/Hct last month- BP=110/70 and feeling Ok, denies HA, fatigue, visual changes, CP, palipit, dizziness, syncope, dyspnea, edema, etc... CAD (ICD-414.00) - known non-obstructive disease... DrCrenshaw plans a screening Myoview soon... VENOUS INSUFFICIENCY (ICD-459.81) - she follows a low salt diet and the Hct helps... HYPERCHOLESTEROLEMIA (ICD-272.0) - on CRESTOR 5mg /d- last FLP 5/08 showed TChol 115, TG 83, HDL 50, LDL 49... tolerating well and taking med regularly. OBESITY (ICD-278.00) -  unable to diet effectively and get weight down... 223#, we discussed diet and exercise program... GERD (ICD-530.81) - EGD 7/05 by DrStark w/ HH- not currently on meds. COLONIC POLYPS (ICD-211.3) - last colonoscopy was 12/03 showing several 3-15mm polyps (hyperplastic)... HX OF GALLSTONE (ICD-V12.79) - seen on CT 7/06 and referred to [facility]- eval by DrCornett and being followed... 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     Vital Signs:  Patient Profile:   70 Years Old Female Weight:  222.8 pounds O2 Sat:      99 % O2 treatment:    Room Air Temp:     98.6 degrees F oral Pulse rate:   49 / minute BP sitting:   108 / 70  (left arm)  Vitals Entered By: Marijo File CMA (January 17, 2008 4:08 PM)                 Physical Exam  WD,  Overweight, 70 y/o BF in NAD... GENERAL:  Alert & oriented; pleasant & cooperative. HEENT:  [place]/AT, EOM-wnl, PERRLA, EACs-clear, TMs-wnl, NOSE-clear, THROAT-clear & wnl. NECK:  Supple w/ fair ROM; no JVD; normal carotid impulses w/o bruits; no thyromegaly or nodules palpated; no lymphadenopathy. CHEST:  Clear to P & A; without wheezes/ rales/ or rhonchi. HEART:  Regular Rhythm, gr 1/6 SEM without rubs or gallops... ABDOMEN:  Obese, soft & nontender; normal bowel sounds; no organomegaly or masses detected. EXT: without deformities, s/p right knee surg, mod arthritic changes; no varicose veins/ +venous insuffic/ tr edema. NEURO:  CN's intact; no focal neuro deficits... DERM:  No lesions noted; no rash etc...       Impression & Recommendations:  Problem # 1:  HYPERTENSION (ICD-401.9) Assessment: Unchanged Controlled... continue med. Her updated medication list for this problem includes:    Lisinopril-hydrochlorothiazide 20-12.5 Mg Tabs (Lisinopril-hydrochlorothiazide) .Marland Kitchen... Take 1 tablet by mouth once a day   Problem # 2:  CAD (ICD-414.00) Assessment: Unchanged Stable... she needs to increase activity as able w/ her knee... f/u w/ DrCrenshaw for Myoview planned soon... Her updated medication list for this problem includes:    Adult Aspirin Ec Low Strength 81 Mg Tbec (Aspirin) .Marland Kitchen... Take 1 tablet by mouth once a day    Lisinopril-hydrochlorothiazide 20-12.5 Mg Tabs (Lisinopril-hydrochlorothiazide) .Marland Kitchen... Take 1 tablet by mouth once a day   Problem # 3:  HYPERCHOLESTEROLEMIA (ICD-272.0) Assessment: Unchanged Donig well on CRESTOR... continue same and recheck fasting labs soon... Her updated medication list for this problem includes:    Crestor 5 Mg Tabs (Rosuvastatin calcium) .Marland Kitchen... Take 1 tablet by mouth once a day   Problem # 4:  GERD (ICD-530.81) Assessment: Unchanged She has a hx of GERD, colon polyps, gallstone... all doing well without symptoms.  Problem # 5:  DEGENERATIVE  JOINT DISEASE (ICD-715.90) f/u w/ DrBeane... in the interim Rx w/ ETODOLAC, VICODIN... Her updated medication list for this problem includes:    Adult Aspirin Ec Low Strength 81 Mg Tbec (Aspirin) .Marland Kitchen... Take 1 tablet by mouth once a day    Etodolac 400 Mg Tabs (Etodolac) .Marland Kitchen... 1 tab by mouth two times a day w/ food as needed for arthritis pain...    Hydrocodone-acetaminophen 5-325 Mg Tabs (Hydrocodone-acetaminophen) .Marland Kitchen... 1 tab by mouth q4h as needed for pain...   Complete Medication List: 1)  Proair Hfa 108 (90 Base) Mcg/act Aers (Albuterol sulfate) .Marland Kitchen.. 1-2 sprays every 6 hours as needed for wheezing... 2)  Adult Aspirin Ec Low Strength 81 Mg Tbec (Aspirin) .... Take 1 tablet by mouth once a day 3)  Lisinopril-hydrochlorothiazide 20-12.5 Mg Tabs (Lisinopril-hydrochlorothiazide) .... Take 1 tablet by mouth once a day 4)  Potassium Chloride Crys Cr 20 Meq Tbcr (Potassium chloride crys cr) .Marland Kitchen.. 1 tab daily as directed... 5)  Crestor 5 Mg Tabs (Rosuvastatin calcium) .... Take 1 tablet by mouth once a day 6)  Etodolac 400 Mg Tabs (Etodolac) .Marland Kitchen.. 1 tab by mouth two times a day w/ food as needed for arthritis pain.Marland KitchenMarland Kitchen 7)  Hydrocodone-acetaminophen 5-325 Mg  Tabs (Hydrocodone-acetaminophen) .Marland Kitchen.. 1 tab by mouth q4h as needed for pain.Marland KitchenMarland Kitchen 8)  Multivitamins Tabs (Multiple vitamin) .... Take 1 tablet by mouth once a day   Patient Instructions: 1)  Today I refilled your PROAIR, ETODOLAC, & VICODIN...  2)  Continue your present medications the same... 3)  Call for any problems.Marland KitchenMarland Kitchen 4)  Please schedule a follow-up appointment in 4 months.    Prescriptions: HYDROCODONE-ACETAMINOPHEN 5-325 MG  TABS (HYDROCODONE-ACETAMINOPHEN) 1 tab by mouth Q4H as needed for pain...  #100 x 5   Entered and Authorized by:   Michele Mcalpine MD   Signed by:   Michele Mcalpine MD on 01/17/2008   Method used:   Print then Give to Patient   RxID:   [id number] ETODOLAC 400 MG  TABS (ETODOLAC) 1 tab by mouth two times a day w/  food as needed for arthritis pain...  #60 x prn   Entered and Authorized by:   Michele Mcalpine MD   Signed by:   Michele Mcalpine MD on 01/17/2008   Method used:   Print then Give to Patient   RxID:   [id number] PROAIR HFA 108 (90 BASE) MCG/ACT  AERS (ALBUTEROL SULFATE) 1-2 sprays every 6 hours as needed for wheezing...  #1 x prn   Entered and Authorized by:   Michele Mcalpine MD   Signed by:   Michele Mcalpine MD on 01/17/2008   Method used:   Print then Give to Patient   RxID:   [id number]  ]

## 2010-12-31 NOTE — Miscellaneous (Signed)
Summary: meds update  Clinical Lists Changes  Medications: Added new medication of TUSSIONEX PENNKINETIC ER 8-10 MG/5ML  LQCR (CHLORPHENIRAMINE-HYDROCODONE) take 1 tsp by mouth every 12 hours as needed cough

## 2010-12-31 NOTE — Progress Notes (Signed)
Summary: [facility] DX  Phone Note From Other Clinic   Summary of Call: Please call [facility] & leave vm to confirm if pt has HTN. 1 [phone]. (message was left w/triage primary care) Initial call taken by: Lamar Sprinkles,  August 01, 2008 3:18 PM  Follow-up for Phone Call        CALLED AND LEFT MESSAGE ON VM FOR SOMEONE TO CALL ME BACK FROM [facility] ABOUT THIS PT. Marijo File CMA  August 04, 2008 5:04 PM   unable to reach anyone at [facility].  have left messages with no response. Marijo File CMA  August 10, 2008 5:10 PM

## 2010-12-31 NOTE — Progress Notes (Signed)
Summary: FORM  Phone Note Call from Patient   Caller: Patient Call For: NADEL Summary of Call: PT NEED FORM FILLED OUT CALL WHEN READY GAVE TO TRIAGE Initial call taken by: Rickard Patience,  May 17, 2010 10:28 AM  Follow-up for Phone Call        lmom that handicap  placard form is signed and in brown folder out front Follow-up by: Philipp Deputy CMA,  May 17, 2010 3:05 PM

## 2010-12-31 NOTE — Assessment & Plan Note (Signed)
Summary: 6 MONTHS/ MBW   Primary Care Provider:  Ledger Heindl  CC:  6 month ROV & review of mult medical problems....  History of Present Illness: 70 y/o BF here for a follow up of her mult med problems including: Allergies, HBP, CAD, Chol, Obesity, etc... she is also followed by DrCrenshaw for Cards, & DrESL for allergies...   ~  December 10, 2009:  she had some CP w/ f/u eval by DrCrenshaw- NEG- see details below... BP under good control, as is the Chol on Cres5mg /d... weight is down 10# but she's had incr difficulty w/ left knee pain & will check w/ DrBeane re: synvisc vs cortisone shot...   ~  June 10, 2010:  she apparently applied for job at [employer] routine PPD was pos- sent to the health dept Tb clinic & CXR was neg> started on INH (rec for 37month course- started 2/11 til 11/11) & they do labs ?monthly... we have not received any records & will request these to be sent to [place] for review... her breathing has been good- no new complaints; BP controlled on meds;  denies CP/ angina but still too sedentary & weight is UP 4#;  asking for new chol med since Cres5 is "too$$"- we discussed trial PRAVACHOL40... finally she notes DrBeane indicated that she needs left TKR as well but she is holding off...   Current Problem List:  ALLERGIC RHINITIS (ICD-477.9) - on NASONEX, ASTEPRO, ZYRTEK, & allergy shots... POSITIVE PPD (ICD-795.5) CHRONIC OBSTRUCTIVE ASTHMA UNSPECIFIED (ICD-493.20) - she had +allergy skin testing for dust mites and started on allergy shots per DrESL along w/ SYMBICORT 160- 2spBid, but she doesn't take anything regularly... +smoker but decreased from 1/2 ppd x 81yrs to 1-2 cig/d.  ~  2/11: she reports +PPD on pre-employment testing> sent to health dept & started on INH 300mg /d x61mo.  ~  CXR 2/11 showed borderline heart size, tort Ao, sl incr markings, NAD...  Hx of HYPERTENSION (ICD-401.9) - off LISINOPRIL/Hct (ALLERGIC to ACE meds) & taking LASIX 20mg /d... BP=112/70 today and  denies HA, fatigue, visual changes, CP, palipit, dizziness, syncope, dyspnea, edema, etc...  CAD (ICD-414.00) - known non-obstructive disease & atypic CP... on ASA 81mg /d, STATIN (Crestor 5), & off ACE.  ~  cath 2/05 showed 20-60% obstructions in all 3 vessels... good LVF...  ~  NuclearStressTest 3/09 was negative- no ischemia or infarction, EF=68%...  ~  ROV w/ DrCrenshaw 3/10 reviewed- contin ASA/ Statin/ smoking cessation/ monitor BP & Chol.  ~  Eval for recurrent CP- EKG showed NSR, NAD;  2DEcho showed mild focal basal septal hypertrophy, norm wall motion w/ EF= 55-65%, mild DD & PAsys= 37;  Myoview NEG- no scar, no ischemia, EF=64%...  VENOUS INSUFFICIENCY (ICD-459.81) - she follows a low salt diet... +LASIX 20mg /d for swelling...  ~  1/10: we discussed no salt, Lasix20, elevation, support hose.  HYPERCHOLESTEROLEMIA (ICD-272.0) - on CRESTOR 5mg /d-   ~  FLP 5/08 on Cres5 showed TChol 115, TG 83, HDL 50, LDL 49... tolerating well and taking med regularly.  ~  FLP 3/09 on Cres5 showed TChol 126, TG 104, HDL 56, LDL 49... rec- same.  ~  FLP 3/10 on Cres5 showed TChol 152, TG 97, HDL 77, LDL 55  ~  FLP 12/10 on Cres5 showed TChol 127, TG 100, HDL 52, LDL 55  ~  7/11:  pt requests change to less expensive statin> try PRAVASTATIN40.  OBESITY (ICD-278.00) - unable to diet effectively and get weight down...   ~  weight 5/10 = 238#, 5\' 5"  tall,  BMI= 40... we discussed diet and exercise program... again!  ~  weight 1/11 = 228#  ~  weight 7/11 = 232#  GERD (ICD-530.81) - EGD 7/05 by DrStark w/ HH- supposed to be on PPI (Omeprazole 20mg ) & H2 blocker (PEPCID 20mg ) at bedtime...  ~  1/10: she stopped above meds & is recommended to restart Rx.  ~  1/11:  still not taking acid suppression meds.  COLONIC POLYPS (ICD-211.3) - last colonoscopy was 12/03 showing several 3-4mm polyps (hyperplastic)...  HX OF GALLSTONE (ICD-V12.79) - seen on CT 7/06 and referred to [facility]- eval by DrCornett and being  followed...  DEGENERATIVE JOINT DISEASE (ICD-715.90) - followed by DrBeane for ortho... Right knee arthroscopy 1/09 without help... s/p right TKR 06/12/08 by Tora Perches... 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 she is off the Etodolac & takes VICODIN Prn...  ~  1/11: now complaining of left knee pain & will f/u w/ DrBeane...  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   Preventive Screening-Counseling & Management  Alcohol-Tobacco     Smoking Status: current     Packs/Day: 1 pack per week  Allergies (verified): No Known Drug Allergies  Comments:  Nurse/Medical Assistant: The patient's medications and allergies were reviewed with the patient and were updated in the Medication and Allergy Lists.  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 mother died age 79 from heart problems and stroke falther  alive age 70  Social History: Reviewed history from 11/01/2009 and no changes required. retired 1 pack per week widowed 2 children  Review of Systems      See HPI       The patient complains of dyspnea on exertion and difficulty walking.  The patient denies anorexia, fever, weight loss, weight gain, vision loss, decreased hearing, hoarseness, chest pain, syncope, peripheral edema, prolonged cough, headaches, hemoptysis, abdominal pain, melena, hematochezia, severe indigestion/heartburn, hematuria, incontinence, muscle weakness, suspicious skin lesions, transient blindness, depression, unusual weight change, abnormal bleeding, enlarged lymph nodes, and angioedema.    Vital Signs:  Patient profile:   70 year old female Height:      65 inches Weight:      232 pounds BMI:     38.75 O2 Sat:      100 % on Room air Temp:     98.6 degrees F oral Pulse rate:   53 / minute BP sitting:   112 / 70  (right arm) Cuff size:   regular  Vitals Entered By: Randell Loop CMA (June 10, 2010 10:29 AM)  O2 Sat at Rest %:  100 O2 Flow:  Room air CC: 6 month ROV & review of mult  medical problems... Is Patient Diabetic? No Pain Assessment Patient in pain? yes      Onset of pain  same leg pain but pt is not ready for surgery   Physical Exam  Additional Exam:  WD, Overweight, 69 y/o BF in NAD... GENERAL:  Alert & oriented; pleasant & cooperative... HEENT:  [name], EOM-wnl, PERRLA, EACs-clear, TMs-wnl, NOSE-clear, THROAT-clear & wnl, no lesions seen; VOICE- sl hoarse. NECK:  Supple w/ decrROM; no JVD; normal carotid impulses w/o bruits; no thyromegaly or nodules palpated; no lymphadenopathy. CHEST:  Clear to P & A; without wheezes/ rales/ or rhonchi heard... HEART:  Regular Rhythm, gr 1/6 SEM without rubs or gallops... ABDOMEN:  Obese, soft & nontender; normal bowel sounds; no organomegaly or masses detected. EXT:  s/p right TKR, mod arthritic changes; scattered  varicose veins/ +venous insuffic/  no  edema... NEURO:  CN's intact; no focal neuro deficits... DERM:  No lesions noted; no rash etc...    Impression & Recommendations:  Problem # 1:  CHRONIC OBSTRUCTIVE ASTHMA UNSPECIFIED (ICD-493.20) Stable on Rx... continue same. Her updated medication list for this problem includes:    Symbicort 160-4.5 Mcg/act Aero (Budesonide-formoterol fumarate) .Marland Kitchen... 2 puffs two times a day    Proair Hfa 108 (90 Base) Mcg/act Aers (Albuterol sulfate) .Marland Kitchen... 1-2 sprays every 6 hours as needed for wheezing...  Problem # 2:  POSITIVE PPD (ICD-795.5) Followed at health dept and has been started on INH... we will send for records...  Problem # 3:  HYPERTENSION (ICD-401.9) Controlled>  same med. Her updated medication list for this problem includes:    Furosemide 20 Mg Tabs (Furosemide) .Marland Kitchen... Take 1 tab by mouth once daily...  Problem # 4:  HYPERCHOLESTEROLEMIA (ICD-272.0) Changed to PRAV40 at her request due to $$ on the Cres5... Her updated medication list for this problem includes:    Pravastatin Sodium 40 Mg Tabs (Pravastatin sodium) .Marland Kitchen... Take 1 tab by mouth at bedtime for cholesterol...  Problem # 5:  OBESITY (ICD-278.00) Diet + exercse> weight reduction is key...  Problem # 6:  DEGENERATIVE JOINT DISEASE (ICD-715.90) She will continue f/u w/ ORTHO DrBeane... Her updated medication list for this problem includes:    Adult Aspirin Ec Low Strength 81 Mg Tbec (Aspirin) .Marland Kitchen... Take 1 tablet by mouth once a day    Hydrocodone-acetaminophen 5-325 Mg Tabs (Hydrocodone-acetaminophen) .Marland Kitchen... 1 tab by mouth every 6-8 h as needed for pain...  Problem # 7:  OTHER MEDICAL PROBLEMS AS NOTED>>>  Complete Medication List: 1)  Nasonex 50 Mcg/act Susp (Mometasone furoate) .... 2 sprays in each nostril two times a day. 2)  Astepro 0.15 % Soln (Azelastine hcl) .... 2 sprays each nostril once daily as needed 3)  Symbicort 160-4.5 Mcg/act Aero (Budesonide-formoterol fumarate) .... 2 puffs two times a day 4)   Proair Hfa 108 (90 Base) Mcg/act Aers (Albuterol sulfate) .Marland Kitchen.. 1-2 sprays every 6 hours as needed for wheezing... 5)  Adult Aspirin Ec Low Strength 81 Mg Tbec (Aspirin) .... Take 1 tablet by mouth once a day 6)  Furosemide 20 Mg Tabs (Furosemide) .... Take 1 tab by mouth once daily.Marland KitchenMarland Kitchen 7)  Potassium Chloride Crys Cr 20 Meq Cr-tabs (Potassium chloride crys cr) .... Take 1 tab by mouth once daily.Marland KitchenMarland Kitchen 8)  Pravastatin Sodium 40 Mg Tabs (Pravastatin sodium) .... Take 1 tab by mouth at bedtime for cholesterol... 9)  Hydrocodone-acetaminophen 5-325 Mg Tabs (Hydrocodone-acetaminophen) .Marland Kitchen.. 1 tab by mouth every 6-8 h as needed for pain... 10)  Skelaxin 800 Mg Tabs (Metaxalone) .Marland KitchenMarland KitchenMarland Kitchen  1 by mouth three times a day as needed muscle spasm 11)  Multivitamins Tabs (Multiple vitamin) .... Take 1 tablet by mouth once a day 12)  Isoniazid 300 Mg Tabs (Isoniazid) .Marland Kitchen.. 1 once daily  Patient Instructions: 1)  Today we updated your med list- see below.... 2)  We decided to change to Crestor to PRAVASTATIN 40mg  to save you $$ on your cholesterol med... 3)  Continue your other meds the same... 4)  Continue your low calorie diet & exercise program... 5)  Call for any problems.Marland KitchenMarland Kitchen 6)  Let's plan a follow up visit in 3-4 months w/ FASTING blood work at that time... Prescriptions: HYDROCODONE-ACETAMINOPHEN 5-325 MG  TABS (HYDROCODONE-ACETAMINOPHEN) 1 tab by mouth every 6-8 H as needed for pain...  #100 x 5   Entered and Authorized by:   Michele Mcalpine MD   Signed by:   Michele Mcalpine MD on 06/10/2010   Method used:   Print then Give to Patient   RxID:   [id number] PRAVASTATIN SODIUM 40 MG TABS (PRAVASTATIN SODIUM) take 1 tab by mouth at bedtime for cholesterol...  #30 x 11   Entered and Authorized by:   Michele Mcalpine MD   Signed by:   Michele Mcalpine MD on 06/10/2010   Method used:   Print then Give to Patient   RxID:   [id number] POTASSIUM CHLORIDE CRYS CR 20 MEQ CR-TABS (POTASSIUM CHLORIDE CRYS CR) take 1 tab  by mouth once daily...  #30 x 11   Entered and Authorized by:   Michele Mcalpine MD   Signed by:   Michele Mcalpine MD on 06/10/2010   Method used:   Print then Give to Patient   RxID:   [id number]

## 2010-12-31 NOTE — Progress Notes (Signed)
Summary: neck & shoulder pain  Phone Note Call from Patient Call back at Home Phone [phone] Call back at [phone]   Caller: Patient Call For: nadel Reason for Call: Talk to Nurse Summary of Call: hurting since Sat. Left side neck and shoulder pain.  No complaints of chest pain or numbness.  Please advise.  Schedulled her appt for 11:45am tomorrow w/TP. Initial call taken by: Eugene Gavia,  February 26, 2010 3:30 PM  Follow-up for Phone Call        called and spoke with pt.  offered her a sooner appt with TP today vs tomorrow.  pt declined and stated she will keep appt for tomorrow.  pt will continue to take advil/tylenol as needed for pain and warm compresses to the site.  pt also aware to go to urgent care/ED if she felt sypmtoms need immediate attention.  Arman Filter LPN  February 26, 2010 3:47 PM

## 2010-12-31 NOTE — Assessment & Plan Note (Signed)
Summary: follow up/ mbw   Chief Complaint:  follow up.  History of Present Illness: 70 y/o BF here for a follow up visit... she has mult med problems as listed below... she went to the ER 05/02/08 w/ atypical CP and was placed on Norflex but she couldn't tolerate this med... she called w/ low BP  ~ 90/40 and DrCrenshaw stopped her Lisinopril/Hct... BP now 122/82 and she is feeling better on less meds... he last f/u w/ cardiology was 02/21/08- reviewed...  Current Problem List;  Hx of HYPERTENSION (ICD-401.9) - off LISINOPRIL/Hct now... BP=122/82 and feeling better- denies HA, fatigue, visual changes, CP, palipit, dizziness, syncope, dyspnea, edema, etc...  CAD (ICD-414.00) - known non-obstructive disease... on ASA 81mg /d, STATIN (Crestor 5), & off ACE.  ~  cath 2/05 showed 20-60% obstructions in all 3 vessels... good LVF...  ~  NuclearStressTest 3/09 was negative- no ischemia or infarction, EF=68%...  VENOUS INSUFFICIENCY (ICD-459.81) - she follows a low salt diet... add LASIX 20mg  as needed.  HYPERCHOLESTEROLEMIA (ICD-272.0) - on CRESTOR 5mg /d-   ~ last FLP 5/08 showed TChol 115, TG 83, HDL 50, LDL 49... tolerating well and taking med regularly.  OBESITY (ICD-278.00) - unable to diet effectively and get weight down... weight = 224#, we discussed diet and exercise program... again!  GERD (ICD-530.81) - EGD 7/05 by DrStark w/ HH- not currently on meds.  COLONIC POLYPS (ICD-211.3) - last colonoscopy was 12/03 showing several 3-10mm polyps (hyperplastic)...  HX OF GALLSTONE (ICD-V12.79) - seen on CT 7/06 and referred to [facility]- eval by DrCornett and being followed...  DEGENERATIVE JOINT DISEASE (ICD-715.90) - followed by DrBeane for ortho... Right knee arthroscopy 1/09 without help... right TKR is scheduled for 06/12/08... 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 she uses ETODOLAC as needed.  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  The patient denies anorexia, fever, weight loss, weight gain, vision loss, decreased hearing, hoarseness, syncope, prolonged cough, headaches, hemoptysis, abdominal pain, melena, hematochezia, severe indigestion/heartburn, hematuria, incontinence, muscle weakness, suspicious skin lesions,  transient blindness, depression, unusual weight change, abnormal bleeding, enlarged lymph nodes, and angioedema.     Vital Signs:  Patient Profile:   70 Years Old Female Weight:      224 pounds O2 Sat:      100 % O2 treatment:    Room Air Temp:     97.1 degrees F oral Pulse rate:   52 / minute BP sitting:   122 / 82  (right arm)  Vitals Entered By: Marijo File CMA (May 17, 2008  10:43 AM)                 Physical Exam  WD, Overweight, 70 y/o BF in NAD... GENERAL:  Alert & oriented; pleasant & cooperative... HEENT:  [place]/AT, EOM-wnl, PERRLA, EACs-clear, TMs-wnl, NOSE-clear, THROAT-clear & wnl. NECK:  Supple w/ decrROM; no JVD; normal carotid impulses w/o bruits; no thyromegaly or nodules palpated; no lymphadenopathy. CHEST:  Clear to P & A; without wheezes/ rales/ or rhonchi. HEART:  Regular Rhythm, gr 1/6 SEM without rubs or gallops... ABDOMEN:  Obese, soft & nontender; normal bowel sounds; no organomegaly or masses detected. EXT:  right patellar bursitis w/ fluid, s/p right knee surg, mod arthritic changes; no varicose veins/ +venous insuffic/ tr edema. NEURO:  CN's intact; no focal neuro deficits... DERM:  No lesions noted; no rash etc...       Impression & Recommendations:  Problem # 1:  HYPERTENSION (ICD-401.9) Assessment: Unchanged Off lisinopril now... follow BP off meds. The following medications were removed from the medication list:    Lisinopril-hydrochlorothiazide 20-12.5 Mg Tabs (Lisinopril-hydrochlorothiazide) .Marland Kitchen... Take 1 tablet by mouth once a day Her updated medication list for this problem includes:    Furosemide 20 Mg Tabs (Furosemide) .Marland Kitchen... Take 1 tab by mouth once daily as needed for swelling that doesn't go down overnight...   Problem # 2:  CAD (ICD-414.00) Assessment: Unchanged Continue other meds, diet efforts, get weight down... Her updated medication list for this problem includes:    Adult Aspirin Ec Low Strength 81 Mg Tbec (Aspirin) .Marland Kitchen... Take 1 tablet by mouth once a day    Furosemide 20 Mg Tabs (Furosemide) .Marland Kitchen... Take 1 tab by mouth once daily as needed for swelling that doesn't go down overnight...   Problem # 3:  VENOUS INSUFFICIENCY (ICD-459.81) Assessment: Unchanged Use Lasix 20mg  as needed...  Problem # 4:  HYPERCHOLESTEROLEMIA (ICD-272.0) Assessment: Unchanged Same med. Her updated medication list for this  problem includes:    Crestor 5 Mg Tabs (Rosuvastatin calcium) .Marland Kitchen... Take 1 tablet by mouth once a day   Problem # 5:  DEGENERATIVE JOINT DISEASE (ICD-715.90) Assessment: Unchanged Surg planned by DrBeane... Her updated medication list for this problem includes:    Adult Aspirin Ec Low Strength 81 Mg Tbec (Aspirin) .Marland Kitchen... Take 1 tablet by mouth once a day    Etodolac 400 Mg Tabs (Etodolac) .Marland Kitchen... 1 tab by mouth two times a day w/ food as needed for arthritis pain...    Hydrocodone-acetaminophen 5-325 Mg Tabs (Hydrocodone-acetaminophen) .Marland Kitchen... 1 tab by mouth q4h as needed for pain...   Complete Medication List: 1)  Proair Hfa 108 (90 Base) Mcg/act Aers (Albuterol sulfate) .Marland Kitchen.. 1-2 sprays every 6 hours as needed for wheezing... 2)  Adult Aspirin Ec Low Strength 81 Mg Tbec (Aspirin) .... Take 1 tablet by mouth once a day 3)  Furosemide 20 Mg Tabs (Furosemide) .... Take 1 tab by mouth once daily as needed for swelling that doesn't go down overnight... 4)  Crestor 5  Mg Tabs (Rosuvastatin calcium) .... Take 1 tablet by mouth once a day 5)  Etodolac 400 Mg Tabs (Etodolac) .Marland Kitchen.. 1 tab by mouth two times a day w/ food as needed for arthritis pain.Marland KitchenMarland Kitchen 6)  Hydrocodone-acetaminophen 5-325 Mg Tabs (Hydrocodone-acetaminophen) .Marland Kitchen.. 1 tab by mouth q4h as needed for pain.Marland KitchenMarland Kitchen 7)  Multivitamins Tabs (Multiple vitamin) .... Take 1 tablet by mouth once a day   Patient Instructions: 1)  Today we updated your med list to reflect the discontinuation of the Lisinopril/Hct and the Potassium meds.Marland KitchenMarland Kitchen  2)  We also wrote a new perscription for Furosemide 20mg  to take in the morning if needed for swelling that doesn't go down overnight... 3)  Call for any problems.Marland KitchenMarland Kitchen  4)  Please schedule a follow-up appointment in 3-4 months.   Prescriptions: FUROSEMIDE 20 MG  TABS (FUROSEMIDE) take 1 tab by mouth once daily as needed for swelling that doesn't go down overnight...  #30 x prn   Entered and Authorized by:   Michele Mcalpine  MD   Signed by:   Michele Mcalpine MD on 05/17/2008   Method used:   Print then Give to Patient   RxID:   [id number]  ]

## 2010-12-31 NOTE — Progress Notes (Signed)
Summary: legs swelling  Phone Note Call from Patient Call back at Home Phone [phone] Call back at [phone]   Caller: Patient Call For: nadel Reason for Call: Acute Illness, Talk to Nurse Summary of Call: spoke w/nurse Friday.  Started fluid pill, but legs are still swelling.  They are ok while propped up but haven't went down since yesterday.  Had them elevated all night but the minute she puits them on they floor they swell. Initial call taken by: Eugene Gavia,  July 31, 2008 8:19 AM  Follow-up for Phone Call        per SN---this swelling is from venous dz.  it will not go away with increase of diuretic.   needs to put on support hose in morning.  no salt, elevate legs, use the support hose, continue the same meds, keep her ov appt with SN. Marijo File CMA  July 31, 2008 11:27 AM   Additional Follow-up for Phone Call Additional follow up Details #1::        pt aware of what sn said, she had already taken extra diuretic for today told pt not to do this again because sn said it would not help, she needs to put support hose on every morning and keep them on until bedtime since she can't tolerate them while she is sleeping, also keep legs elevated as much as possible throughout the day and avoid all salt-pt states she understands also told pt to keep f/u appt with sn Additional Follow-up by: Philipp Deputy CMA,  July 31, 2008 11:36 AM

## 2010-12-31 NOTE — Assessment & Plan Note (Signed)
Summary: follow up/ mbw   Chief Complaint:  3 month ROV....  History of Present Illness: 70 y/o BF here for a follow up of her mult med problems including: Allergies, HBP, CAD, Chol, Obesity, etc...   ~  she went to the ER 05/02/08 w/ atypical CP and was placed on Norflex but she couldn't tolerate this med... she called w/ low BP  ~ 90/40 and DrCrenshaw stopped her Lisinopril/Hct... BP improved to 122/82 and she is felt better on less meds...  ~  somehow she restarted her Lisinopril and ADM 8/21-24/09 w/ resp distress- felt to be an allergic reaction to the ACE- see discharge summary- reviewed...  ~  seen 08/15/08- c/o 1 week hx of nightly throat symptoms including pain/ discomfort/ congestion... every eve ~5pm and also betw 2-3am... she uses hot tea/ lemon/ honey peppermint for relief... denies throat swelling, lip swelling, hoarseness, etc... ? reflux/ LER symptoms- not on PPI Rx- therefore added PROTONIX 40mg  before dinner & RANITIDINE 300mg  Qhs... saw ENT- DrCrossley 9/09- no VC lesions x edema of cords- he ?food allergies...  ~  10/09:  states sl improved but unable to take the Ranitidine at bedtime (too large)... we will change to Pepcid 40mg  Qhs... she wants to have the allergy testing (?food allergies) and we sent her to DrESL for testing- skin testing +dust & given Medrol, Symbicort160, Nasonex, Astepro, Zyrtek, & allergy shots recommended (they apparently did not test for food allergies)...   ~  December 19, 2008:  feeling better on the allergy meds from DrESL... she is getting allergy shots once per week now and states "it helps"... BP under control 120/82 today- and she decreased the Lasix20 to Prn because "I pee too much"... she notes some left leg discomfort x 1 week- lower left leg, dull, comes & goes w/o known aggrevating/ alleviating factors... no redness/ heat/ skin wound or drainage...     Current Problem List:  Hx of HYPERTENSION (ICD-401.9) - off LISINOPRIL/Hct now & taking LASIX  20mg /d... BP=120/82 today and denies HA, fatigue, visual changes, CP, palipit, dizziness, syncope, dyspnea, edema, etc...  CAD (ICD-414.00) - known non-obstructive disease... on ASA 81mg /d, STATIN (Crestor 5), & off ACE.  ~  cath 2/05 showed 20-60% obstructions in all 3 vessels... good LVF...  ~  NuclearStressTest 3/09 was negative- no ischemia or infarction, EF=68%...  VENOUS INSUFFICIENCY (ICD-459.81) - she follows a low salt diet... +LASIX 20mg /d for swelling...  ~  1/10: ** see above ** we discussed no salt, Lasix20, elevation, support hose.  HYPERCHOLESTEROLEMIA (ICD-272.0) - on CRESTOR 5mg /d-   ~ FLP 5/08 showed TChol 115, TG 83, HDL 50, LDL 49... tolerating well and taking med regularly.  ~  FLP 3/09 showed TChol 126, TG 104, HDL 56, LDL 49... rec- same.  OBESITY (ICD-278.00) - unable to diet effectively and get weight down... weight stable at 234#... we discussed diet and exercise program... again!  GERD (ICD-530.81) - EGD 7/05 by DrStark w/ HH- now on PPI (PROTONIX 40mg /d) & H2 blocker (PEPCID 40mg /d) at bedtime...  ~  1/10: she stopped above meds & is recommended to restart Rx.  COLONIC POLYPS (ICD-211.3) - last colonoscopy was 12/03 showing several 3-40mm polyps (hyperplastic)...  HX OF GALLSTONE (ICD-V12.79) - seen on CT 7/06 and referred to [facility]- eval by DrCornett and being followed...  DEGENERATIVE JOINT DISEASE (ICD-715.90) - followed by DrBeane for ortho... Right knee arthroscopy 1/09 without help... s/p right TKR 06/12/08 by Tora Perches... she has signif prepatellar bursitis that requires freq taps.Marland KitchenMarland Kitchen  she is off the Etodolac & takes VICODIN Prn...  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)    LEG CRAMPS, NOCTURNAL (ICD-729.82)      Past Surgical History:        S/P Hysterectomy    S/P CSpine surg in 1993 for Arnold-Chiari Malformation    S/P R knee arthroscopy 1/09 by Tora Perches    S/P right TKR by DrBeane 7/09   Family History:    Reviewed history from 05/17/2008 and no changes required:       mother died age 55 from heart problems and stroke       falther alive age 67  Social History:    Reviewed history from 05/17/2008 and no changes required:       retired       1-3 cigs every now and then       widowed       2 children   Risk Factors:  Tobacco use:  current   Review of Systems       The patient complains of hoarseness and dyspnea on exertion.  The patient denies anorexia, fever, weight loss, weight gain, vision loss, decreased hearing, chest pain, syncope, peripheral edema, prolonged cough, headaches, hemoptysis, abdominal pain, melena, hematochezia, severe indigestion/heartburn, hematuria, incontinence, muscle weakness, suspicious skin lesions, transient blindness, difficulty walking, depression, unusual weight change, abnormal bleeding, enlarged lymph nodes, and angioedema.    MS      Complains of joint pain, joint swelling, and stiffness.      Denies joint redness, loss of strength, low back pain, mid back pain, muscle aches, muscle , cramps, muscle weakness, and thoracic pain.  Allergy      Complains of seasonal  allergies.      Denies hives or rash, itching eyes, persistent infections, and sneezing.   Vital Signs:  Patient Profile:   70 Years Old Female Weight:      234 pounds O2 Sat:      97 % O2 treatment:    Room Air Temp:     97.1 degrees F oral Pulse rate:   54 / minute BP sitting:   120 / 82  (left arm) Cuff size:   regular  Vitals Entered By: Marijo File CMA (December 19, 2008 10:15 AM)             Comments pt brought all of her meds today     Physical Exam  WD, Overweight, 70 y/o BF in NAD... GENERAL:  Alert & oriented; pleasant & cooperative... HEENT:  [place]/AT, EOM-wnl, PERRLA, EACs-clear, TMs-wnl, NOSE-clear, THROAT-clear & wnl, no lesions seen; VOICE- sl hoarse. NECK:  Supple w/  decrROM; no JVD; normal carotid impulses w/o bruits; no thyromegaly or nodules palpated; no lymphadenopathy. CHEST:  Clear to P & A; without wheezes/ rales/ or rhonchi heard... HEART:  Regular Rhythm, gr 1/6 SEM without rubs or gallops... ABDOMEN:  Obese, soft & nontender; normal bowel sounds; no organomegaly or masses detected. EXT:  right patellar bursitis w/ fluid, s/p right TKR, mod arthritic changes; scattered  varicose veins/ +venous insuffic/ no  edema., neg homans sign NEURO:  CN's intact; no focal neuro deficits... DERM:  No lesions noted; no rash etc...        Impression & Recommendations:  Problem # 1:  ALLERGY (ICD-995.3) Feeling better on allergy shots per ESL... continue Symbicort, Singulair, Nasonex, etc...  Problem # 2:  HYPERTENSION (ICD-401.9) Controlled... she is on diet alone w/ Lasix20 Prn... The following medications were removed from the medication list:    Lasix 20 Mg Tabs (Furosemide) .Marland Kitchen... 1 by mouth once daily as needed   Problem # 3:  HYPERCHOLESTEROLEMIA (ICD-272.0) Stable on low dose Crestor... Her updated medication list for this problem includes:    Crestor 5 Mg Tabs (Rosuvastatin calcium) .Marland Kitchen... Take 1 tablet by mouth once a day   Problem # 4:   OBESITY (ICD-278.00) She hasn't lost weight... diet + exercise discussed... get weight down!  Problem # 5:  GERD (ICD-530.81) Advised to restart PPI medication... The following medications were removed from the medication list:    Pantoprazole Sodium 40 Mg Tbec (Pantoprazole sodium) .Marland Kitchen... Take 1 tab by mouth once daily- 30 min before dinner...    Pepcid 40 Mg Tabs (Famotidine) .Marland Kitchen... Take 1 tab by mouth at bedtime...   Problem # 6:  DEGENERATIVE JOINT DISEASE (ICD-715.90) She prefers the Vicodin...  The following medications were removed from the medication list:    Etodolac 400 Mg Tabs (Etodolac) .Marland Kitchen... 1 tab by mouth two times a day w/ food as needed for arthritis pain... Her updated medication list for this problem includes:    Adult Aspirin Ec Low Strength 81 Mg Tbec (Aspirin) .Marland Kitchen... Take 1 tablet by mouth once a day    Hydrocodone-acetaminophen 5-325 Mg Tabs (Hydrocodone-acetaminophen) .Marland Kitchen... 1 tab by mouth q4h as needed for pain...   Problem # 7:  VENOUS INSUFFICIENCY (ICD-459.81) For her leg discomfort I have rec- no salt, elevation, TED's etc...  Complete Medication List: 1)  Symbicort 160-4.5 Mcg/act Aero (Budesonide-formoterol fumarate) .... 2 puffs two times a day 2)  Proair Hfa 108 (90 Base) Mcg/act Aers (Albuterol sulfate) .Marland Kitchen.. 1-2 sprays every 6 hours as needed for wheezing... 3)  Singulair 10 Mg Tabs (Montelukast sodium) .... Take 1 tablet by mouth once a day 4)  Adult Aspirin Ec Low Strength 81 Mg Tbec (Aspirin) .... Take 1 tablet by mouth once a day 5)  Crestor 5 Mg Tabs (Rosuvastatin calcium) .... Take 1 tablet by mouth once a day 6)  Hydrocodone-acetaminophen 5-325 Mg Tabs (Hydrocodone-acetaminophen) .Marland Kitchen.. 1 tab by mouth q4h as needed for pain.Marland KitchenMarland Kitchen 7)  Multivitamins Tabs (Multiple vitamin) .... Take 1 tablet by mouth once a day   Patient Instructions: 1)  Today we updated your med list- see below.... 2)  I'm pleased that you are improved- keep up the good work w/ your  exercise program at the [facility].Marland KitchenMarland Kitchen 3)  Now we need to get on a good diet- low carb, low fat... get your weight down!!! 4)  Call for any problems.Marland KitchenMarland Kitchen 5)  Please schedule a follow-up appointment in 4 months, and we will plan FASTING blood  work on return.Marland KitchenMarland Kitchen

## 2010-12-31 NOTE — Progress Notes (Signed)
Summary: pt having chest pain   Phone Note Call from Patient Call back at Home Phone [phone]   Caller: Patient Reason for Call: Talk to Nurse, Talk to Doctor Summary of Call: pt starting having chest pain near the right breast last night and it has been happening off and on all night Initial call taken by: Omer Jack,  November 01, 2009 8:12 AM  Follow-up for Phone Call        SPOKE WITH PT WHO C/O CHEST PAIN TO THE RIGHT SIDE OF HER CHEST CLOSE TO HER BREAST, NO SOB, NO N/V, NO RADIATION.  LASTED ABOUT 2 TO 3 HOURS RESOLVED ON ITS OWN.  PT DIDN'T TRY TO TAKE ANY TYPE OF MEDICATION TO HELP IT.  STATED SHE PROPPED HERSELF UP IN THE BED AND THAT HELPED SOME.  HER HX DOES SHOW MODERATE CAD FROM A CATH IN 2005 AND A NORMAL MYOVIEW IN [facility] OF 2009.  SHE STATES SHE ALSO HAS A HISTORY OF HIATAL HERNIA AND THAT THIS PAIN REALLY FELT MORE LIKE THAT DID IT THE PAST.  REQUESTED PT CALL MD THAT TREATS HER FOR HER HERNIA AS SHE IS PAIN FEEL AT THIS TIME.  PT IN AGREEMENT. Follow-up by: Charolotte Capuchin, RN,  November 01, 2009 8:31 AM

## 2010-12-31 NOTE — Progress Notes (Signed)
Summary: PAPERS  Phone Note Call from Patient Call back at [phone]   Caller: Patient Call For: NADEL Summary of Call: NEED PAPERS FILLED OUT FOR HOSPITA STAY Initial call taken by: Rickard Patience,  July 25, 2008 10:59 AM  Follow-up for Phone Call        called and spoke with pt. pt states she dropped of [employer] insurance papers to be filled out from recent hosp stay.  I printed off d/c summary and h&p from [facility] and put paperwork on SN's cart.  Cyndia Diver LPN  July 25, 2008 11:26 AM   Additional Follow-up for Phone Call Additional follow up Details #1::        placed papers on SN cart to be filled out and signed. Marijo File CMA  July 25, 2008 4:16 PM    sent papers down to health port to be filled out. Marijo File CMA  July 26, 2008 5:09 PM     Additional Follow-up for Phone Call Additional follow up Details #2::    papers sent back for SN to sign.  the papers are on SN cart. Marijo File CMA  August 09, 2008 10:53 AM   PER SN PAPERS FILLED OUT AND SENT BACK THIS AM -WAS PUT IN P/U FOR [facility]OS[facility] Follow-up by: Marijo File CMA,  August 11, 2008 5:10 PM

## 2010-12-31 NOTE — Progress Notes (Signed)
Summary: GYN referral  Phone Note Call from Patient   Caller: Patient Call For: nadel Summary of Call: pt would like to be referred to a gynecologist Initial call taken by: Rickard Patience,  August 13, 2010 11:31 AM  Follow-up for Phone Call        Pt's GYN is no longer practicing and she is requesting a referral. Per Marliss Czar, okay to do GYN referral. Michel Bickers [facility]  August 13, 2010 11:44 AM  Patient aware referral is being made and PCC's will call her with an appt date and time.Michel Bickers [facility]  August 13, 2010 11:50 AM  New Problems: MENORRHAGIA, POSTMENOPAUSAL (ICD-627.1)   New Problems: MENORRHAGIA, POSTMENOPAUSAL (ICD-627.1)

## 2010-12-31 NOTE — Progress Notes (Signed)
Summary: med  Phone Note Call from Patient Call back at [facility] Phone [phone] Call back at [phone]   Caller: Patient Call For: nadel Reason for Call: Talk to Nurse Summary of Call: pt still coughing and chest pain.  called in meds but it doeawsn't seem to be working. [facility] [address] Initial call taken by: Eugene Gavia,  Apr 10, 2008 9:17 AM  Follow-up for Phone Call        lmtcb Vernie Murders  Apr 10, 2008 10:36 AM  Pt states the Promethazine is not helping. She is having pain across her chest when she coughs and feels like she is having a lot of  nasal drainage down the back of her throat. Please advise.  Follow-up by: Michel Bickers CMA,  Apr 10, 2008 11:12 AM  Additional Follow-up for Phone Call Additional follow up Details #1::        Per Dr. Kriste Basque, pt to discontinue Promethazine and restart Tussinex 4 oz 1 tsp by mouth two times a day as needed cough with 5 refills. [facility] and pt is aware. Additional Follow-up by: Michel Bickers CMA,  Apr 10, 2008 11:32 AM    New/Updated Medications: Sandria Senter ER 8-10 MG/5ML  LQCR (CHLORPHENIRAMINE-HYDROCODONE) 1 tsp by mouth every 12 hours as needed cough   Prescriptions: TUSSIONEX PENNKINETIC ER 8-10 MG/5ML  LQCR (CHLORPHENIRAMINE-HYDROCODONE) 1 tsp by mouth every 12 hours as needed cough  #4 x 5   Entered by:   Michel Bickers CMA   Authorized by:   Michele Mcalpine MD   Signed by:   Michel Bickers CMA on 04/10/2008   Method used:   Telephoned to ...       [facility] [address] [phone]*       [address]       [place], [place]  [address]       Ph: [phone] or [phone]       Fax: [phone]   RxID:   [id number]

## 2010-12-31 NOTE — Assessment & Plan Note (Signed)
Summary: 6 wk rov /ddp   Primary Care Provider:  Robbyn Hodkinson  CC:  8 month ROV & review of mult medical problems... .  History of Present Illness: 70 y/o BF here for a follow up of her mult med problems including: Allergies, HBP, CAD, Chol, Obesity, etc...   ~  ER 05/02/08 w/ atypical CP- Rx Norflex but couldn't tolerate this med... she called w/ low BP  ~ 90/40 and DrCrenshaw stopped her Lisinopril/Hct... BP improved to 122/82 and she is felt better on less meds...  ~  somehow she restarted her Lisinopril and ADM 8/21-24/09 w/ resp distress- felt to be an allergic reaction to the ACE- see discharge summary- reviewed...  ~  Sep09: seen w/ ? reflux/ LER symptoms- added PROTONIX 40mg  before dinner & RANITIDINE 300mg  Qhs... saw ENT- DrCrossley 9/09- no VC lesions x edema of cords.  ~  Oct09:  states sl improved but unable to take the Ranitidine at bedtime (too large)... we will change to Pepcid 40mg  Qhs... she wants to have allergy testing (?food allergies) and we sent her to DrESL for testing- skin testing +dust & given Medrol, Symbicort160, Nasonex, Astepro, Zyrtek, & allergy shots recommended (they apparently did not test for food allergies)...   ~  Jan10:  feeling better on the allergy meds from DrESL... she is getting allergy shots once per week now and states "it helps"... BP under control 120/82 today- and she decreased the Lasix20 to Prn because "I pee too much".   ~  Apr 18, 2009:  Ran out of her meds 2 weeks ago, needs refill perscriptions, she says... weight is up to 238#, which is a BMI= 40 for her 5\' 5"  frame... still smoking 1-2 cig/d... we discussed the importance of diet/ exercise/ weight reduction/ discontinuing all smoking/ and taking your medicines every day!!!   ~  December 10, 2009:  she had some CP w/ f/u eval by DrCrenshaw- NEG- see details below... BP under good control, as is the Chol on Cres5mg /d... weight is down 10# but she's had incr difficulty w/ left knee pain & will check  w/ DrBeane re: synvisc vs cortisone shot...    Current Problem List:  ALLERGIC RHINITIS (ICD-477.9) - on NASONEX, ASTEPRO, ZYRTEK, & allergy shots... CHRONIC OBSTRUCTIVE ASTHMA UNSPECIFIED (ICD-493.20) - she had +allergy skin testing for dust mites and started on allergy shots per DrESL along w/ SYMBICORT 160- 2spBid, but she doesn't take anything regularly... +smoker but decreased from 1/2 ppd x 92yrs to 1-2 cig/d.  ~  5/10:  "I have an appt w/ my asthma doctor soon"  Hx of HYPERTENSION (ICD-401.9) - off LISINOPRIL/Hct (ALLERGIC to ACE meds) & taking LASIX 20mg /d... BP=112/74 today and denies HA, fatigue, visual changes, CP, palipit, dizziness, syncope, dyspnea, edema, etc...  CAD (ICD-414.00) - known non-obstructive disease & atypic CP... on ASA 81mg /d, STATIN (Crestor 5), & off ACE.  ~  cath 2/05 showed 20-60% obstructions in all 3 vessels... good LVF...  ~  NuclearStressTest 3/09 was negative- no ischemia or infarction, EF=68%...  ~  ROV w/ DrCrenshaw 3/10 reviewed- contin ASA/ Statin/ smoking cessation/ monitor BP & Chol.  ~  Eval for recurrent CP- EKG showed NSR, NAD;  2DEcho showed mild focal basal septal hypertrophy, norm wall motion w/ EF= 55-65%, mild DD & PAsys= 37;  Myoview NEG- no scar, no ischemia, EF=64%...  VENOUS INSUFFICIENCY (ICD-459.81) - she follows a low salt diet... +LASIX 20mg /d for swelling...  ~  1/10: we discussed no salt, Lasix20, elevation,  support hose.  HYPERCHOLESTEROLEMIA (ICD-272.0) - on CRESTOR 5mg /d-   ~  FLP 5/08 showed TChol 115, TG 83, HDL 50, LDL 49... tolerating well and taking med regularly.  ~  FLP 3/09 showed TChol 126, TG 104, HDL 56, LDL 49... rec- same.  ~  FLP 3/10 showed TChol 152, TG 97, HDL 77, LDL 55  ~  FLP 12/10 showed TChol 127, TG 100, HDL 52, LDL 55  OBESITY (ICD-278.00) - unable to diet effectively and get weight down...   ~  weight 5/10 = 238#, 5\' 5"  tall,  BMI= 40... we discussed diet and exercise program... again!  ~  weight  1/11 = 228#  GERD (ICD-530.81) - EGD 7/05 by DrStark w/ HH- supposed to be on PPI (Omeprazole 20mg ) & H2 blocker (PEPCID 20mg ) at bedtime...  ~  1/10: she stopped above meds & is recommended to restart Rx.  ~  1/11:  still not taking acid suppression meds.  COLONIC POLYPS (ICD-211.3) - last colonoscopy was 12/03 showing several 3-60mm polyps (hyperplastic)...  HX OF GALLSTONE (ICD-V12.79) - seen on CT 7/06 and referred to [facility]- eval by DrCornett and being followed...  DEGENERATIVE JOINT DISEASE (ICD-715.90) - followed by DrBeane for ortho... Right knee arthroscopy 1/09 without help... s/p right TKR 06/12/08 by Tora Perches... 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 she is off the Etodolac & takes VICODIN Prn...  ~  1/11: now complaining of left knee pain & will f/u w/ DrBeane...  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    Allergies (verified): No Known Drug Allergies  Comments:  Nurse/Medical Assistant: The patient's medications and allergies were reviewed with the patient and were updated in the Medication and Allergy Lists.  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 mother died age 56 from heart problems and stroke falther alive age 34  Social History: Reviewed history from 11/01/2009 and no changes required. retired 1 pack per week widowed 2 children  Review of Systems      See HPI       The patient complains of dyspnea on exertion.  The patient denies anorexia, fever, weight loss, weight gain, vision loss, decreased hearing, hoarseness, chest pain, syncope, peripheral edema, prolonged cough, headaches, hemoptysis, abdominal pain, melena, hematochezia, severe indigestion/heartburn, hematuria, incontinence, muscle weakness, suspicious skin lesions, transient blindness, difficulty walking, depression, unusual weight change, abnormal bleeding, enlarged lymph nodes, and angioedema.    Vital Signs:  Patient profile:   70 year old female Height:      65 inches Weight:      228 pounds BMI:     38.08 O2 Sat:      99 % on Room air Temp:     98.8  degrees F oral Pulse rate:   70 / minute BP sitting:   112 / 74  (left arm) Cuff size:   regular  Vitals Entered By: Randell Loop CMA (December 10, 2009 9:35 AM)  O2 Sat at Rest %:  99 O2 Flow:  Room air CC: 8 month ROV & review of mult medical problems...  Comments meds updated today   Physical Exam  Additional Exam:  WD, Overweight, 70 y/o BF in NAD... GENERAL:  Alert & oriented; pleasant & cooperative... HEENT:  [place]/AT, EOM-wnl, PERRLA, EACs-clear, TMs-wnl, NOSE-clear, THROAT-clear & wnl, no lesions seen; VOICE- sl hoarse. NECK:  Supple w/ decrROM; no JVD; normal carotid impulses w/o bruits; no thyromegaly or nodules palpated; no lymphadenopathy. CHEST:  Clear to P & A; without wheezes/ rales/ or rhonchi heard... HEART:  Regular Rhythm, gr 1/6  SEM without rubs or gallops... ABDOMEN:  Obese, soft & nontender; normal bowel sounds; no organomegaly or masses detected. EXT:  s/p right TKR, mod arthritic changes; scattered  varicose veins/ +venous insuffic/ no  edema... NEURO:  CN's intact; no focal neuro deficits... DERM:  No lesions noted; no rash etc...     Impression & Recommendations:  Problem # 1:  CHRONIC OBSTRUCTIVE ASTHMA UNSPECIFIED (ICD-493.20) Must quit smoking... continue meds. Her updated medication list for this problem includes:    Symbicort 160-4.5 Mcg/act Aero (Budesonide-formoterol fumarate) .Marland Kitchen... 2 puffs two times a day    Proair Hfa 108 (90 Base) Mcg/act Aers (Albuterol sulfate) .Marland Kitchen... 1-2 sprays every 6 hours as needed for wheezing...  Problem # 2:  HYPERTENSION (ICD-401.9) Controlled on Lasix... same Rx. Her updated medication list for this problem includes:    Furosemide 20 Mg Tabs (Furosemide) .Marland Kitchen... Take 1 tab by mouth once daily...  Problem # 3:  CAD (ICD-414.00) Recent eval by DrCrenshaw- neg...  must quit all smoking. Her updated medication list for this problem includes:    Adult Aspirin Ec Low Strength 81 Mg Tbec (Aspirin) .Marland Kitchen... Take 1 tablet by mouth once a day    Furosemide 20 Mg Tabs (Furosemide) .Marland Kitchen... Take 1 tab by mouth once daily...  Problem # 4:  HYPERCHOLESTEROLEMIA (ICD-272.0) Stable on the Crestor5... Her updated medication list for this problem includes:    Crestor 10 Mg Tabs (Rosuvastatin calcium) .Marland Kitchen... Take as directed for cholesterol...  Problem # 5:  OBESITY (ICD-278.00) Must get weight down...  Problem # 6:  GERD (ICD-530.81) GI is stable...  Problem # 7:  DEGENERATIVE JOINT DISEASE (ICD-715.90) c/o pain on left side now... f/u w/ Ortho for ?shots, etc... Her updated medication list for this problem includes:    Adult Aspirin Ec Low Strength 81 Mg Tbec (Aspirin) .Marland Kitchen... Take 1 tablet by mouth once a day    Hydrocodone-acetaminophen 5-325 Mg Tabs (Hydrocodone-acetaminophen)  .Marland Kitchen... 1 tab by mouth q4h as needed for pain...  Problem # 8:  OTHER MEDICAL PROBLEMS AS NOTED>>>  Complete Medication List: 1)  Nasonex 50 Mcg/act Susp (Mometasone furoate) .... 2 sprays in each nostril two times a day. 2)  Symbicort 160-4.5 Mcg/act Aero (Budesonide-formoterol fumarate) .... 2 puffs two times a day 3)  Proair Hfa 108 (90 Base) Mcg/act Aers (Albuterol sulfate) .Marland Kitchen.. 1-2 sprays every 6 hours as needed for wheezing... 4)  Adult Aspirin Ec Low Strength 81 Mg Tbec (Aspirin) .... Take 1 tablet by mouth once a day 5)  Furosemide 20 Mg Tabs (Furosemide) .... Take 1 tab by mouth once daily.Marland KitchenMarland Kitchen 6)  Potassium Chloride Crys Cr 20 Meq Cr-tabs (Potassium chloride  crys cr) .... Take 1 tab by mouth once daily.Marland KitchenMarland Kitchen 7)  Crestor 10 Mg Tabs (Rosuvastatin calcium) .... Take as directed for cholesterol... 8)  Hydrocodone-acetaminophen 5-325 Mg Tabs (Hydrocodone-acetaminophen) .Marland Kitchen.. 1 tab by mouth q4h as needed for pain.Marland KitchenMarland Kitchen 9)  Multivitamins Tabs (Multiple vitamin) .... Take 1 tablet by mouth once a day  Other Orders: Prescription Created Electronically [phone])  Patient Instructions: 1)  Today we updated your med list- see below.... 2)  Today we wrote new perscription for the Crestor 10mg - take 1/2 tab daily as directed... 3)  Check back w/ DrBeane regarding your left knee arthritis.Marland KitchenMarland Kitchen 4)  Call for any problems.Marland KitchenMarland Kitchen 5)  Please schedule a follow-up appointment in 6 months. Prescriptions: CRESTOR 10 MG TABS (ROSUVASTATIN CALCIUM) take as directed for cholesterol...  #30 x prn   Entered and Authorized by:   Michele Mcalpine MD   Signed by:   Michele Mcalpine MD on 12/10/2009   Method used:   Print then Give to Patient   RxID:   [id number]

## 2010-12-31 NOTE — Assessment & Plan Note (Signed)
Summary: Cardiology Nuclear Study  Nuclear Med Background Indications for Stress Test: Evaluation for Ischemia   History: Asthma, COPD, Heart Catheterization, Myocardial Perfusion Study  History Comments: '05 Heart Cath: 60% Ostial LAD lesion 3/09 MPS: NL, EF=68%  Symptoms: Chest Pain    Nuclear Pre-Procedure Cardiac Risk Factors: Family History - CAD, Hypertension, Lipids, Smoker Caffeine/Decaff Intake: None NPO After: 12:00 AM Lungs: clear IV 0.9% NS with Angio Cath: 22g     IV Site: (R)AC IV Started by: Burna Mortimer Deal RT-N Chest Size (in) 42     Cup Size B     Height (in): 65 Weight (lb): 232 BMI: 38.75  Nuclear Med Study 1 or 2 day study:  1 day     Stress Test Type:  Eugenie Birks Reading MD:  Olga Millers, MD     Referring MD:  B.Ether Goebel Resting Radionuclide:  Technetium 22m Tetrofosmin     Resting Radionuclide Dose:  10.9 mCi  Stress Radionuclide:  Technetium 72m Tetrofosmin     Stress Radionuclide Dose:  33.0 mCi   Stress Protocol   Lexiscan: 0.4 mg   Stress Test Technologist:  Frederick Peers EMT-P     Nuclear Technologist:  Burna Mortimer Deal RT-N  Rest Procedure  Myocardial perfusion imaging was performed at rest 45 minutes following the intravenous administration of Myoview Technetium 76m Tetrofosmin.  Stress Procedure  The patient received IV Lexiscan 0.4 mg over 15-seconds.  Myoview injected at 30-seconds.  There were non specific ST changes with infusion.  Quantitative spect images were obtained after a 45 minute delay.  QPS Raw Data Images:  Acuisition technically good; normal left ventricular size. Stress Images:  There is normal uptake in all areas. Rest Images:  Normal homogeneous uptake in all areas of the myocardium. Subtraction (SDS):  No evidence of ischemia. Transient Ischemic Dilatation:  .92  (Normal <1.22)  Lung/Heart Ratio:  .29  (Normal <0.45)  Quantitative Gated Spect Images QGS EDV:  104 ml QGS ESV:  38 ml QGS EF:  64 % QGS cine images:  Normal  wall motion.   Overall Impression  Exercise Capacity: Lexiscan study with no exercise. ECG Impression: No significant ST segment change suggestive of ischemia. Overall Impression: There is no sign of scar or ischemia.  Appended Document: Cardiology Nuclear Study ok  Appended Document: Cardiology Nuclear Study n/a x1 ./cy  Appended Document: Cardiology Nuclear Study pt aware of results

## 2010-12-31 NOTE — Assessment & Plan Note (Signed)
Summary: Acute NP office visit - neck/shoulder pain   Primary Provider/Referring Provider:  scott nadel  CC:  left sided neck and shoulder pain onset Saturday and states she is unable to lift her left arm.  pt doesn't remember hitting her arm.  has been treating for arthritis with no relief.  History of Present Illness: 70 yobf smoker with  mult med problems as listed in [facility]  September 04, 2009---Present to the office with a 2wks history of right sided back/shoulder blade pain.   Has taken OTC extra strength Tylenols and prescription Hydrocodone but didn't help.   Sore to touch, and raising arm. No known injury.  Denies dyspnea, coughing/hemoptysis, abd. pain, n/v/d, indigestion/heart burn, sore throat, or fever.     November 01, 2009--Presents for an acute office visit. Complains of chest pain in center of chest x2episodes lasting approx 2hrs.  describes as a sharp pain that does not radiate into the arm or into the jaw. Describes pain in mid sternal area, sharp with no radiation, denises diaphoresis, dyspnea, syncope, dizziness, exertional symptoms. This happened both times at bedtime. She is sore to touch in mid sternal. She has known CAD -non obstructive-cath 2/05 showed 20-60% obstructions in all 3 vessels... good LVF... ~  NuclearStressTest 3/09 was negative- no ischemia or infarction, EF=68%...  ~  last ROV w/ DrCrenshaw 3/10 reviewed- contin ASA/ Statin/ smoking cessation/ monitor BP & Chol.   February 22, 2010 Acute visit.  Pt c/o HA x 2 days- had some nasal congestion and bloody nasal d/c yesterday.  Has nasonex but doesn't use it.  no viz, no nausea. Pain over bridge of nose no purulent nasal discharge.Also c/o  slt bloody vaginal discharge  x when wiped. no dysruia.     February 27, 2010--Presents for an acute office visit. Complains of left sided neck and shoulder pain onset Saturday, states she is unable to lift her left arm.  pt doesn't remember hitting her arm.  has been treating for  arthritis with no relief-nsaids. feels very sore at top of arm, no bruising. Denies chest pain, dyspnea, orthopnea, hemoptysis, fever, n/v/d, edema, headache,recent travel or antibiotics  Medications Prior to Update: 1)  Nasonex 50 Mcg/act Susp (Mometasone Furoate) .... 2 Sprays in Each Nostril Two Times A Day. 2)  Symbicort 160-4.5 Mcg/act Aero (Budesonide-Formoterol Fumarate) .... 2 Puffs Two Times A Day 3)  Proair Hfa 108 (90 Base) Mcg/act  Aers (Albuterol Sulfate) .Marland Kitchen.. 1-2 Sprays Every 6 Hours As Needed For Wheezing... 4)  Adult Aspirin Ec Low Strength 81 Mg  Tbec (Aspirin) .... Take 1 Tablet By Mouth Once A Day 5)  Furosemide 20 Mg Tabs (Furosemide) .... Take 1 Tab By Mouth Once Daily.Marland KitchenMarland Kitchen 6)  Potassium Chloride Crys Cr 20 Meq Cr-Tabs (Potassium Chloride Crys Cr) .... Take 1 Tab By Mouth Once Daily.Marland KitchenMarland Kitchen 7)  Crestor 10 Mg Tabs (Rosuvastatin Calcium) .... Take As Directed For Cholesterol... 8)  Hydrocodone-Acetaminophen 5-325 Mg  Tabs (Hydrocodone-Acetaminophen) .Marland Kitchen.. 1 Tab By Mouth Q4h As Needed For Pain... 9)  Multivitamins   Tabs (Multiple Vitamin) .... Take 1 Tablet By Mouth Once A Day 10)  Isoniazid 300 Mg Tabs (Isoniazid) .Marland Kitchen.. 1 Once Daily 11)  Astepro 0.15 % Soln (Azelastine Hcl) .... 2 Sprays Each Nostril Once Daily As Needed  Current Medications (verified): 1)  Nasonex 50 Mcg/act Susp (Mometasone Furoate) .... 2 Sprays in Each Nostril Two Times A Day. 2)  Symbicort 160-4.5 Mcg/act Aero (Budesonide-Formoterol Fumarate) .... 2 Puffs Two Times A  Day 3)  Proair Hfa 108 (90 Base) Mcg/act  Aers (Albuterol Sulfate) .Marland Kitchen.. 1-2 Sprays Every 6 Hours As Needed For Wheezing... 4)  Adult Aspirin Ec Low Strength 81 Mg  Tbec (Aspirin) .... Take 1 Tablet By Mouth Once A Day 5)  Furosemide 20 Mg Tabs (Furosemide) .... Take 1 Tab By Mouth Once Daily.Marland KitchenMarland Kitchen 6)  Potassium Chloride Crys Cr 20 Meq Cr-Tabs (Potassium Chloride Crys Cr) .... Take 1 Tab By Mouth Once Daily.Marland KitchenMarland Kitchen 7)  Crestor 10 Mg Tabs (Rosuvastatin  Calcium) .... Take As Directed For Cholesterol... 8)  Hydrocodone-Acetaminophen 5-325 Mg  Tabs (Hydrocodone-Acetaminophen) .Marland Kitchen.. 1 Tab By Mouth Q4h As Needed For Pain... 9)  Multivitamins   Tabs (Multiple Vitamin) .... Take 1 Tablet By Mouth Once A Day 10)  Isoniazid 300 Mg Tabs (Isoniazid) .Marland Kitchen.. 1 Once Daily 11)  Astepro 0.15 % Soln (Azelastine Hcl) .... 2 Sprays Each Nostril Once Daily As Needed  Allergies (verified): No Known Drug Allergies  Past History:  Past Medical History: Last updated: 02/22/2010 ALLERGIC RHINITIS (ICD-477.9)     - Non compliant with nasal steroids     - Afrin x 5 days only as needed added February 22, 2010  CHRONIC OBSTRUCTIVE ASTHMA UNSPECIFIED (ICD-493.20) TOBACCO ABUSE (ICD-305.1) HYPERTENSION (ICD-401.9) CAD (ICD-414.00) VENOUS INSUFFICIENCY (ICD-459.81) HYPERCHOLESTEROLEMIA (ICD-272.0) OBESITY (ICD-278.00) GERD (ICD-530.81) COLONIC POLYPS (ICD-211.3) HX OF GALLSTONE (ICD-V12.79) DEGENERATIVE JOINT DISEASE (ICD-715.90) BACK PAIN, LUMBAR (ICD-724.2) 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) SEIZURES, HX OF (ICD-V12.49) 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) LEG CRAMPS, NOCTURNAL (ICD-729.82)  Past Surgical History: Last updated: 12/10/2009 S/P Hysterectomy S/P CSpine surg in 1993 for Arnold-Chiari Malformation S/P R knee arthroscopy 1/09 by DrBeane S/P right TKR by DrBeane 7/09  Family History: Last updated: June 08, 2008 mother died age 68 from heart problems and stroke falther alive age 68  Social History: Last updated: 11/01/2009 retired 1 pack per week widowed 2 children  Risk Factors: Smoking Status: current (11/01/2009) Packs/Day: 1 pack per week (11/01/2009)  Review of Systems      See HPI  Vital Signs:  Patient profile:   70 year old female Height:      65 inches Weight:      228.13 pounds BMI:     38.10 O2 Sat:      100 % on Room air Temp:     98.4 degrees F oral Pulse rate:   54 / minute BP sitting:   110 / 74   (right arm) Cuff size:   regular  Vitals Entered By: Boone Master CNA (February 27, 2010 12:12 PM)  O2 Flow:  Room air CC: left sided neck and shoulder pain onset Saturday, states she is unable to lift her left arm.  pt doesn't remember hitting her arm.  has been treating for arthritis with no relief Is Patient Diabetic? No Comments Medications reviewed with patient Daytime contact number verified with patient. Boone Master CNA  February 27, 2010 12:12 PM    Physical Exam  Additional Exam:  GEN: A/Ox3; pleasant , NAD wt 228 > 227 February 22, 2010  HEENT:  [place]/AT, , EACs-clear, TMs-wnl, NOSE-clear, THROAT-clear, moderate non specific bilateral turbinate edema  NECK:  Supple w/ fair ROM; no JVD; normal carotid impulses w/o bruits; no thyromegaly or nodules palpated; no lymphadenopathy. RESP  Clear to P & A; w/o, wheezes/ rales/ or rhonchi CARD:  RRR, no m/r/g  , mid sternal tenderness, no deformity noted, no eccymosis, or redness or rash.  GI:   Soft & nt; nml bowel sounds; no organomegaly or  masses detected, no guarding,  rebound,  Musco: Warm bil,  no calf tenderness edema, clubbing, pulses intact tender along upper left arm, tender along lateral left neck and sub scapular region, no rash, no bruising noted. nml grips, decreased rom on left w/ reproducible pain .     Impression & Recommendations:  Problem # 1:  DEGENERATIVE JOINT DISEASE (ICD-715.90)  Flare w/ left neck/shoulder/arm pain  REC:  Ibuprofen 200mg  4 tabs three times a day w/ food for 5-7 days Skelaxin 800mg  three times a day as needed muscle pain  May use vicodin for severe pain as needed  Alternate ice and heat three times a day as needed  If not improving in next 2 weeks, call back we will refer to ortho to evaluate.  Please contact office for sooner follow up if symptoms do not improve or worsen  Her updated medication list for this problem includes:    Adult Aspirin Ec Low Strength 81 Mg Tbec (Aspirin) .Marland Kitchen... Take 1  tablet by mouth once a day    Hydrocodone-acetaminophen 5-325 Mg Tabs (Hydrocodone-acetaminophen) .Marland Kitchen... 1 tab by mouth q4h as needed for pain...  Orders: Est. Patient Level IV (14782)  Medications Added to Medication List This Visit: 1)  Skelaxin 800 Mg Tabs (Metaxalone) .Marland Kitchen.. 1 by mouth three times a day as needed muscle spasm  Complete Medication List: 1)  Nasonex 50 Mcg/act Susp (Mometasone furoate) .... 2 sprays in each nostril two times a day. 2)  Symbicort 160-4.5 Mcg/act Aero (Budesonide-formoterol fumarate) .... 2 puffs two times a day 3)  Proair Hfa 108 (90 Base) Mcg/act Aers (Albuterol sulfate) .Marland Kitchen.. 1-2 sprays every 6 hours as needed for wheezing... 4)  Adult Aspirin Ec Low Strength 81 Mg Tbec (Aspirin) .... Take 1 tablet by mouth once a day 5)  Furosemide 20 Mg Tabs (Furosemide) .... Take 1 tab by mouth once daily.Marland KitchenMarland Kitchen 6)  Potassium Chloride Crys Cr 20 Meq Cr-tabs (Potassium chloride crys cr) .... Take 1 tab by mouth once daily.Marland KitchenMarland Kitchen 7)  Crestor 10 Mg Tabs (Rosuvastatin calcium) .... Take as directed for cholesterol... 8)  Hydrocodone-acetaminophen 5-325 Mg Tabs (Hydrocodone-acetaminophen) .Marland Kitchen.. 1 tab by mouth q4h as needed for pain.Marland KitchenMarland Kitchen 9)  Multivitamins Tabs (Multiple vitamin) .... Take 1 tablet by mouth once a day 10)  Isoniazid 300 Mg Tabs (Isoniazid) .Marland Kitchen.. 1 once daily 11)  Astepro 0.15 % Soln (Azelastine hcl) .... 2 sprays each nostril once daily as needed 12)  Skelaxin 800 Mg Tabs (Metaxalone) .Marland Kitchen.. 1 by mouth three times a day as needed muscle spasm  Patient Instructions: 1)  Ibuprofen 200mg  4 tabs three times a day w/ food for 5-7 days 2)  Skelaxin 800mg  three times a day as needed muscle pain  3)  May use vicodin for severe pain as needed  4)  Alternate ice and heat three times a day as needed  5)  If not improving in next 2 weeks, call back we will refer to ortho to evaluate.  6)  Please contact office for sooner follow up if symptoms do not improve or worsen   Prescriptions: SKELAXIN 800 MG TABS (METAXALONE) 1 by mouth three times a day as needed muscle spasm  #30 x 0   Entered and Authorized by:   Rubye Oaks NP   Signed by:   Rubye Oaks NP on 02/27/2010   Method used:   [facility]        [facility] [address] [phone]* (retail)  [address]       [place], [place]  [address]       Ph: [phone] or [phone]       Fax: [phone]   RxID:   [id number]

## 2010-12-31 NOTE — Progress Notes (Signed)
Summary: advanced  Phone Note Other Incoming   Call placed by: [phone]-suzanne davis Call placed to: Camree Wigington Summary of Call: is ok for the patient to continue physcial therapy after knee replacement Initial call taken by: Tivis Ringer,  July 24, 2008 12:13 PM  Follow-up for Phone Call        [facility] and [facility] susan at [facility].  per SN this is ok for her to continue with physical therapy. Follow-up by: Marijo File CMA,  July 24, 2008 12:28 PM

## 2010-12-31 NOTE — Progress Notes (Signed)
Summary: cramps  Phone Note Call from Patient Call back at Home Phone [phone]   Caller: Patient Call For: nadel Reason for Call: Talk to Nurse Summary of Call: cramps in left leg all week, wake up from sleep w/cramps.  Has come off potassium med for surgery in June and never went back on it. Initial call taken by: Eugene Gavia,  November 01, 2008 9:09 AM  Follow-up for Phone Call        scheduled pt for appt to see tp on thursday for leg cramps x several weeks Follow-up by: Philipp Deputy CMA,  November 01, 2008 10:03 AM

## 2010-12-31 NOTE — Progress Notes (Signed)
Summary: sick  Phone Note Call from Patient Call back at Home Phone [phone]   Caller: Patient Call For: nadel Reason for Call: Acute Illness, Talk to Nurse Complaint: Cough/Sore throat Summary of Call: went whole w/e feeling like throat was constricting.  cough, sinus trouble, drainage. Initial call taken by: Eugene Gavia,  August 08, 2008 8:59 AM  Follow-up for Phone Call        Pt c/o PND and cough with clear mucus. This started on Friday. Pt says she is unable to lay down due to the cough. She is scheduled for f/u next week and wants to know what she can do for this in the meantime. Pt denies any fever of sob. Please advise. Michel Bickers CMA  August 08, 2008 9:52 AM  Additional Follow-up for Phone Call Additional follow up Details #1::        per sn give z-pak #1 as directed, mmw 4oz 1 tsp four times a day swish and swallow and use claritin otc for drainage Additional Follow-up by: Philipp Deputy CMA,  August 08, 2008 12:00 PM    Additional Follow-up for Phone Call Additional follow up Details #2::    Pt is aware. [facility] [address] Follow-up by: Michel Bickers CMA,  August 08, 2008 12:17 PM  New/Updated Medications: ZITHROMAX Z-PAK 250 MG TABS (AZITHROMYCIN) Take as directed * MMW Swish and swallo 1 tsp four times daily   Prescriptions: MMW Swish and swallo 1 tsp four times daily  #4 x 0   Entered by:   Michel Bickers CMA   Authorized by:   Michele Mcalpine MD   Signed by:   Michel Bickers CMA on 08/08/2008   Method used:   Telephoned to ...       [facility] [address] [phone]* (retail)       [address]       [place], [place]  [address]       Ph: [phone] or [phone]       Fax: [phone]   RxID:   [id number] ZITHROMAX Z-PAK 250 MG TABS (AZITHROMYCIN) Take as directed  #1 x 0   Entered by:   Michel Bickers CMA   Authorized by:   Michele Mcalpine MD   Signed by:   Michel Bickers CMA on 08/08/2008  Method used:   Telephoned to ...       [facility] [address] [phone]* (retail)       [address]       [place], [place]  [address]       Ph: [phone] or [phone]       Fax: [phone]   RxID:   [id number]

## 2010-12-31 NOTE — Assessment & Plan Note (Signed)
Summary: rov/abnormal ekg/jss  Medications Added DEXILANT 60 MG CPDR (DEXLANSOPRAZOLE) 1 tab by mouth once daily        Primary Provider:  scott nadel  CC:  abnormal EKG.  History of Present Illness: [name] is a pleasant female who has a history of coronary disease. She did have a cardiac catheterization in February 2005.  At that time, she was found to have moderate coronary disease with the most significant being a 60% LAD.  Her ejection fraction was normal.  Her last Myoview was on February 11, 2008, and showed no significant abnormality and an ejection fraction was 68%. I last saw her in March of 2010. Since then she was seen in the pulmonary office on December 2 with complaints of chest pain. This occurred one time the previous week and on the evening of December 1. It is in the right breast area and described as a sharp pain. It lasts from one hour and resolved spontaneously. It does not radiate. There is no associated nausea, vomiting, shortness of breath or diaphoresis. It is not pleuritic, positional, related to food or exertional. She otherwise denies dyspnea on exertion, orthopnea, PND or exertional chest pain. She occasionally has mild pedal edema.  Current Medications (verified): 1)  Nasonex 50 Mcg/act Susp (Mometasone Furoate) .... 2 Sprays in Each Nostril Two Times A Day. 2)  Symbicort 160-4.5 Mcg/act Aero (Budesonide-Formoterol Fumarate) .... 2 Puffs Two Times A Day 3)  Proair Hfa 108 (90 Base) Mcg/act  Aers (Albuterol Sulfate) .Marland Kitchen.. 1-2 Sprays Every 6 Hours As Needed For Wheezing... 4)  Adult Aspirin Ec Low Strength 81 Mg  Tbec (Aspirin) .... Take 1 Tablet By Mouth Once A Day 5)  Furosemide 20 Mg Tabs (Furosemide) .... Take 1 Tab By Mouth Once Daily.Marland KitchenMarland Kitchen 6)  Potassium Chloride Crys Cr 20 Meq Cr-Tabs (Potassium Chloride Crys Cr) .... Take 1 Tab By Mouth Once Daily.Marland KitchenMarland Kitchen 7)  Crestor 5 Mg  Tabs (Rosuvastatin Calcium) .... Take 1 Tablet By Mouth Once A Day 8)  Hydrocodone-Acetaminophen  5-325 Mg  Tabs (Hydrocodone-Acetaminophen) .Marland Kitchen.. 1 Tab By Mouth Q4h As Needed For Pain... 9)  Multivitamins   Tabs (Multiple Vitamin) .... Take 1 Tablet By Mouth Once A Day 10)  Robaxin 500 Mg Tabs (Methocarbamol) .Marland Kitchen.. 1 By Mouth Three Times A Day As Needed For  Muscle Spasms 11)  Dexilant 60 Mg Cpdr (Dexlansoprazole) .Marland Kitchen.. 1 Tab By Mouth Once Daily  Allergies: No Known Drug Allergies  Past History:  Past Medical History: Reviewed history from 11/01/2009 and no changes required. ALLERGIC RHINITIS (ICD-477.9) - on NASONEX, ASTEPRO, ZYRTEK, & allergy shots... CHRONIC OBSTRUCTIVE ASTHMA UNSPECIFIED (ICD-493.20) - she had +allergy skin testing for dust mites and started on allergy shots per DrESL along w/ SYMBICORT 160- 2spBid, but she doesn't take anything regularly... +smoker but decreased from 1/2 ppd x 30yrs to 1-2 cig/d.  ~  5/10:  "I have an appt w/ my asthma doctor soon"  Hx of HYPERTENSION (ICD-401.9) - off LISINOPRIL/Hct (ALLERGIC to ACE meds) & taking LASIX 20mg /d... BP=136/84 today and denies HA, fatigue, visual changes, CP, palipit, dizziness, syncope, dyspnea, edema, etc...  CAD (ICD-414.00) - known non-obstructive disease & atypic CP... on ASA 81mg /d, STATIN (Crestor 5), & off ACE.  ~  cath 2/05 showed 20-60% obstructions in all 3 vessels... good LVF...  ~  NuclearStressTest 3/09 was negative- no ischemia or infarction, EF=68%...  ~  last ROV w/ DrCrenshaw 3/10 reviewed- contin ASA/ Statin/ smoking cessation/ monitor BP & Chol.  VENOUS  INSUFFICIENCY (ICD-459.81) - she follows a low salt diet... +LASIX 20mg /d for swelling...  ~  1/10: we discussed no salt, Lasix20, elevation, support hose.  HYPERCHOLESTEROLEMIA (ICD-272.0) - on CRESTOR 5mg /d-   ~  FLP 5/08 showed TChol 115, TG 83, HDL 50, LDL 49... tolerating well and taking med regularly.  ~  FLP 3/09 showed TChol 126, TG 104, HDL 56, LDL 49... rec- same.  ~  FLP 3/10 showed TChol 152, TG 97, HDL 77, LDL 55  OBESITY  (GNF-621.30) - unable to diet effectively and get weight down... weight today = 238#, 5\' 5"  tall,  BMI= 40... we discussed diet and exercise program... again!  GERD (ICD-530.81) - EGD 7/05 by DrStark w/ HH- now on PPI (PROTONIX 40mg /d) & H2 blocker (PEPCID 40mg /d) at bedtime...  ~  1/10: she stopped above meds  COLONIC POLYPS (ICD-211.3) - last colonoscopy was 12/03 showing several 3-49mm polyps (hyperplastic)...  HX OF GALLSTONE (ICD-V12.79) - seen on CT 7/06 and referred to [facility]- eval by DrCornett and being followed...  Past Surgical History: Reviewed history from 04/18/2009 and no changes required. S/P Hysterectomy S/P CSpine surg in 1993 for Arnold-Chiari Malformation S/P R knee arthroscopy 1/09 by DrBeane S/P right TKR by DrBeane 7/09  Social History: Reviewed history from 11/01/2009 and no changes required. retired 1 pack per week widowed 2 children  Review of Systems       no fevers or chills, productive cough, hemoptysis, dysphasia, odynophagia, melena, hematochezia, dysuria, hematuria, rash, seizure activity, orthopnea, PND,  claudication. Remaining systems are negative.   Vital Signs:  Patient profile:   70 year old female Height:      65 inches Weight:      233 pounds BMI:     38.91 Pulse rate:   58 / minute Resp:     12 per minute BP sitting:   140 / 80  (left arm)  Vitals Entered By: Kem Parkinson (November 02, 2009 8:45 AM)  Physical Exam  General:  well-developed obese in no acute distress. Skin is warm and dry. Head:  HEENT is normal. Neck:  supple no bruits. No thyromegaly. Lungs:  clear to auscultation Heart:  regular rate and rhythm with 2/6 systolic murmur left sternal border. Abdomen:  soft and nontender. No masses palpated. Extremities:  trace to 1+ edema. Neurologic:  grossly intact.   EKG  Procedure date:  11/01/2009  Findings:      Sinus rhythm at a rate of approximately 60. Left ventricular hypertrophy. No significant ST  changes.  Impression & Recommendations:  Problem # 1:  CHEST PAIN (ICD-786.50) Patient's symptoms somewhat atypical and electrocardiogram shows no ST changes. However given prior coronary disease we'll risk stratify with adenosine Myoview. Her updated medication list for this problem includes:    Adult Aspirin Ec Low Strength 81 Mg Tbec (Aspirin) .Marland Kitchen... Take 1 tablet by mouth once a day  Orders: Nuclear Stress Test (Nuc Stress Test)  Problem # 2:  CARDIAC MURMUR (ICD-785.2)  Check echocardiogram. Her updated medication list for this problem includes:    Furosemide 20 Mg Tabs (Furosemide) .Marland Kitchen... Take 1 tab by mouth once daily...  Orders: Echocardiogram (Echo)  Her updated medication list for this problem includes:    Furosemide 20 Mg Tabs (Furosemide) .Marland Kitchen... Take 1 tab by mouth once daily...  Problem # 3:  TOBACCO ABUSE (ICD-305.1) Patient counseled on discontinuing for between 3-10 minutes.  Problem # 4:  HYPERCHOLESTEROLEMIA (ICD-272.0) Continue statin. Check lipids and liver. Her updated medication list  for this problem includes:    Crestor 5 Mg Tabs (Rosuvastatin calcium) .Marland Kitchen... Take 1 tablet by mouth once a day  Problem # 5:  CHRONIC OBSTRUCTIVE ASTHMA UNSPECIFIED (ICD-493.20)  Her updated medication list for this problem includes:    Symbicort 160-4.5 Mcg/act Aero (Budesonide-formoterol fumarate) .Marland Kitchen... 2 puffs two times a day    Proair Hfa 108 (90 Base) Mcg/act Aers (Albuterol sulfate) .Marland Kitchen... 1-2 sprays every 6 hours as needed for wheezing...  Problem # 6:  HYPERTENSION (ICD-401.9) Systolic pressure mildly elevated. We'll follow this and add medications as needed. Check bmet given diuretic use. Her updated medication list for this problem includes:    Adult Aspirin Ec Low Strength 81 Mg Tbec (Aspirin) .Marland Kitchen... Take 1 tablet by mouth once a day    Furosemide 20 Mg Tabs (Furosemide) .Marland Kitchen... Take 1 tab by mouth once daily...  Problem # 7:  CAD (ICD-414.00) Continue aspirin and  statin. Her updated medication list for this problem includes:    Adult Aspirin Ec Low Strength 81 Mg Tbec (Aspirin) .Marland Kitchen... Take 1 tablet by mouth once a day  Problem # 8:  GERD (ICD-530.81)  Her updated medication list for this problem includes:    Dexilant 60 Mg Cpdr (Dexlansoprazole) .Marland Kitchen... 1 tab by mouth once daily  Patient Instructions: 1)  Your physician recommends that you schedule a follow-up appointment in:6 MONTHS 2)  Your physician recommends that you return for lab work ZO:XWRU STRESS TEST-LIPID/LIVER/BMP/272.0/V58.69/401.1 3)  Your physician has requested that you have an echocardiogram.  Echocardiography is a painless test that uses sound waves to create images of your heart. It provides your doctor with information about the size and shape of your heart and how well your heart's chambers and valves are working.  This procedure takes approximately one hour. There are no restrictions for this procedure. 4)  Your physician has requested that you have an adenosine myoview.  For further information please visit [email or web address]  Please follow instruction sheet, as given.

## 2010-12-31 NOTE — Progress Notes (Signed)
Summary: headache/ blood in stool  Phone Note Call from Patient Call back at [phone]   Caller: Patient Call For: nadel Summary of Call: severe headache blood in stool would like to be seen today Initial call taken by: Rickard Patience,  February 22, 2010 8:41 AM  Follow-up for Phone Call        pt scheduled to see MW today at 11:55am.  Arman Filter LPN  February 22, 2010 9:44 AM

## 2010-12-31 NOTE — Progress Notes (Signed)
Summary: breast pain and vaginal bleeding  Phone Note Call from Patient Call back at Home Phone [phone] Call back at [phone]   Caller: Patient Call For: Harshaan Whang Reason for Call: Talk to Nurse Summary of Call: pt has a ? bug bite on spot at tip of her breast.  Pt says she doesn't know if it was a bite - wants to speak to nurse.  Also went to bathroom and after urinating noticed a small amount of blood when she wiped.  Pt wanted to know if she might need to come in? Initial call taken by: Eugene Gavia,  August 13, 2010 8:56 AM  Follow-up for Phone Call        Pt c/o pain in her left breast, no heat to the area, no redness x 1 day. She also c/o vaginal bleeding this am when she wiped after urinating. Pt denies dysuria, fever, back pain. Sh estaets the bleeding happend last month about the same time. I advised the pt she needs to call her GYN doctor to address the above because any post menopausal bleeding should be addressed by an GYN and that he could also address her breast pain as well. pt staets she sees her gyn for reg checkups and will give him a call. Carron Curie CMA  August 13, 2010 9:53 AM

## 2010-12-31 NOTE — Progress Notes (Signed)
Summary: sick   Phone Note Call from Patient Call back at Home Phone [phone] Call back at [phone]   Caller: Patient Call For: nadel Reason for Call: Talk to Nurse, Talk to Doctor Summary of Call: .Marland KitchenDORIS [name] has a tickle in her throat, causing her to have a bad cough. [facility] Initial call taken by: Eugene Gavia,  February 16, 2008 9:18 AM  Follow-up for Phone Call        refill given for tussionex per dr nadel Follow-up by: Philipp Deputy CMA,  February 16, 2008 10:10 AM

## 2010-12-31 NOTE — Assessment & Plan Note (Signed)
Summary: back pain/apc   Primary Provider/Referring Provider:  scott nadel  CC:  soreness at left shoulder blade x62month and soreness at hiatal hernia x1week and soreness in mole on the left side of her neck that she would like to be looked at..  History of Present Illness: 70 y/o BF here for a follow up visit... she has mult med problems as listed below...    ~  she went to the ER 05/02/08 w/ atypical CP and was placed on Norflex but she couldn't tolerate this med... she called w/ low BP  ~ 90/40 and DrCrenshaw stopped her Lisinopril/Hct... BP improved to 122/82 and she is felt better on less meds...   ~  somehow she restarted her Lisinopril and ADM 8/21-24/09 w/ resp distress- felt to be an allergic reaction to the ACE- see discharge summary- reviewed...   ~  seen 08/15/08- c/o 1 week hx of nightly throat symptoms including pain/ discomfort/ congestion... every eve ~5pm and also betw 2-3am... she uses hot tea/ lemon/ honey peppermint for relief... denies throat swelling, lip swelling, hoarseness, etc... ? reflux/ LER symptoms- not on PPI Rx- therefore added PROTONIX 40mg  before dinner & RANITIDINE 300mg  Qhs... saw ENT- DrCrossley 9/09- no VC lesions x edema of cords- he ?food allergies...   ~  September 18, 2008:  states sl improved but unable to take the Ranitidine at bedtime (too large)... we will change to Pepcid 40mg  Qhs... she wants to have the allergy testing (?food allergies) and we will set her up to see DrESL...   November 02, 2008--presents for nocturnal leg cramps over last 5 days, c/o cramps along both calf and someting into thighs at times at night. uses ice without much relief. Is not using diuretic or potassium for several weeks. Denies chest pain, dyspnea, orthopnea, hemoptysis, fever, n/v/d, edema, recent travel or antibiotics.     Current Problem List:  September 04, 2009---Present to the office with a 2wks history of right sided back/shoulder blade pain.   Has taken OTC extra  strength Tylenols and prescription Hydrocodone but didn't help.   Sore to touch, and raising arm. No known injury.  Denies dyspnea, coughing/hemoptysis, abd. pain, n/v/d, indigestion/heart burn, sore throat, or fever.  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 2 Sprays in Each Nostril Two Times A Day. 2)  Symbicort 160-4.5 Mcg/act Aero (Budesonide-Formoterol Fumarate) .... 2 Puffs Two Times A Day 3)  Proair Hfa 108 (90 Base) Mcg/act  Aers (Albuterol Sulfate) .Marland Kitchen.. 1-2 Sprays Every 6 Hours As Needed For Wheezing... 4)  Adult Aspirin Ec Low Strength 81 Mg  Tbec (Aspirin) .... Take 1 Tablet By Mouth Once A Day 5)  Furosemide 20 Mg Tabs (Furosemide) .... Take 1 Tab By Mouth Once Daily.Marland KitchenMarland Kitchen 6)  Potassium Chloride Crys Cr 20 Meq Cr-Tabs (Potassium Chloride Crys Cr) .... Take 1 Tab  By Mouth Once Daily.Marland KitchenMarland Kitchen 7)  Crestor 5 Mg  Tabs (Rosuvastatin Calcium) .... Take 1 Tablet By Mouth Once A Day 8)  Hydrocodone-Acetaminophen 5-325 Mg  Tabs (Hydrocodone-Acetaminophen) .Marland Kitchen.. 1 Tab By Mouth Q4h As Needed For Pain... 9)  Multivitamins   Tabs (Multiple Vitamin) .... Take 1 Tablet By Mouth Once A Day  Current Medications (verified): 1)  Nasonex 50 Mcg/act Susp (Mometasone Furoate) .... 2 Sprays in Each Nostril Two Times A Day. 2)  Symbicort 160-4.5  Mcg/act Aero (Budesonide-Formoterol Fumarate) .... 2 Puffs Two Times A Day 3)  Proair Hfa 108 (90 Base) Mcg/act  Aers (Albuterol Sulfate) .Marland Kitchen.. 1-2 Sprays Every 6 Hours As Needed For Wheezing... 4)  Adult Aspirin Ec Low Strength 81 Mg  Tbec (Aspirin) .... Take 1 Tablet By Mouth Once A Day 5)  Furosemide 20 Mg Tabs (Furosemide) .... Take 1 Tab By Mouth Once Daily.Marland KitchenMarland Kitchen 6)  Potassium Chloride Crys Cr 20 Meq Cr-Tabs (Potassium Chloride Crys Cr) .... Take 1 Tab By Mouth Once Daily.Marland KitchenMarland Kitchen 7)  Crestor 5 Mg  Tabs (Rosuvastatin Calcium) .... Take 1 Tablet By Mouth Once A Day 8)  Hydrocodone-Acetaminophen 5-325 Mg  Tabs (Hydrocodone-Acetaminophen) .Marland Kitchen.. 1 Tab By Mouth Q4h As Needed For Pain... 9)  Multivitamins   Tabs (Multiple Vitamin) .... Take 1 Tablet By Mouth Once A Day  Allergies (verified): No Known Drug Allergies  Past History:  Past Medical History: Last updated: 04/18/2009 ALLERGIC RHINITIS (ICD-477.9) CHRONIC OBSTRUCTIVE ASTHMA UNSPECIFIED (ICD-493.20) HYPERTENSION (ICD-401.9) CAD (ICD-414.00) VENOUS INSUFFICIENCY (ICD-459.81) HYPERCHOLESTEROLEMIA (ICD-272.0) OBESITY (ICD-278.00) GERD (ICD-530.81) COLONIC POLYPS (ICD-211.3) HX OF GALLSTONE (ICD-V12.79) DEGENERATIVE JOINT DISEASE (ICD-715.90) BACK PAIN, LUMBAR (ICD-724.2) 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) SEIZURES, HX OF (ICD-V12.49) 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) LEG CRAMPS, NOCTURNAL (ICD-729.82)  Past Surgical History: Last updated: 04/18/2009 S/P Hysterectomy S/P CSpine surg in 1993 for Arnold-Chiari Malformation S/P R knee arthroscopy 1/09 by Tora Perches S/P right TKR by DrBeane 7/09  Family History: Last updated: May 21, 2008 mother died age 48 from heart problems and stroke falther alive age 71  Social History: Last updated: May 21, 2008 retired 1-3 cigs every now and then widowed 2 children  Risk Factors: Smoking Status: current (12/19/2008) Packs/Day: 3 cigs (11/02/2008)  Review of  Systems  The patient denies fever, vision loss, decreased hearing, hoarseness, chest pain, syncope, dyspnea on exertion, peripheral edema, prolonged cough, headaches, hemoptysis, abdominal pain, melena, severe indigestion/heartburn, hematuria, muscle weakness, difficulty walking, unusual weight change, abnormal bleeding, and angioedema.    Vital Signs:  Patient profile:   70 year old female Weight:      232.50 pounds BMI:     38.83 O2 Sat:      97 % on Room air Temp:     97.5 degrees F oral Pulse rate:   79 / minute BP sitting:   106 / 72  (left arm) Cuff size:   large  Vitals Entered By: Boone Master CNA (September 04, 2009 2:41 PM)  O2 Flow:  Room air CC: soreness at left shoulder blade x40month, soreness at hiatal hernia x1week and soreness in mole on the left side of her neck that she would like to be looked at. Is Patient Diabetic? No Comments Medications reviewed with patient Daytime contact number verified with patient. Boone Master CNA  September 04, 2009 2:41 PM    Physical Exam  Additional Exam:  GEN: A/Ox3; pleasant , NAD HEENT:  [place]/AT, , EACs-clear, TMs-wnl, NOSE-clear, THROAT-clear NECK:  Supple w/ fair ROM; no JVD; normal carotid impulses w/o bruits; no thyromegaly  or nodules palpated; no lymphadenopathy. RESP  Clear to P & A; w/o, wheezes/ rales/ or rhonchi. CARD:  RRR, no m/r/g   GI:   Soft & nt; nml bowel sounds; no organomegaly or masses detected. Musco: Warm bil,  no calf tenderness edema, clubbing, pulses intact tenderness along right subscapular area, shoulder rom nml w/ reproducible pain w/ extension, nml girps,  Neuro: nml equal grips/strength    Impression & Recommendations:  Problem # 1:  BACK STRAIN, THORACIC (ICD-847.1)   Advil 600mg  ( 3 200mg  tabs) two times a day w/ food for 5-7 days for back  Skelaxin 800mg  three times a day as needed muscle spasm.  Heat to back three times a day as needed  follow up as scheduled.  Please contact office for  sooner follow up if symptoms do not improve or worsen   Orders: Est. Patient Level III (24401) Prescription Created Electronically [id number])  Problem # 2:       Problem # 3:       Medications Added to Medication List This Visit: 1)  Skelaxin 800 Mg Tabs (Metaxalone) .Marland Kitchen.. 1 by mouth three times a day as needed muscle spasm  Patient Instructions: 1)  Advil 600mg  ( 3 200mg  tabs) two times a day w/ food for 5-7 days for back  2)  Skelaxin 800mg  three times a day as needed muscle spasm.  3)  Heat to back three times a day as needed  4)  follow up as scheduled.  5)  Please contact office for sooner follow up if symptoms do not improve or worsen  Prescriptions: SKELAXIN 800 MG TABS (METAXALONE) 1 by mouth three times a day as needed muscle spasm  #30 x 0   Entered and Authorized by:   Rubye Oaks NP   Signed by:   Rubye Oaks NP on 09/04/2009   Method used:   [facility]        [facility]  [address] [phone]       [address]       [place], [place]  [address]       Ph: [phone] or [phone]       Fax: [phone]   RxID:   [id number]

## 2010-12-31 NOTE — Miscellaneous (Signed)
Summary: refill tussionex  Clinical Lists Changes   faxed request was sent for refill on tussionex, last filled on 02/16/2008.  request was denied due to it being too ealry to fill. ..................................................................Marland KitchenBoone Master CNA  February 23, 2008 3:51 PM

## 2010-12-31 NOTE — Assessment & Plan Note (Signed)
Summary: chest pain in middle chest since last night/ddp   Primary Provider/Referring Provider:  scott nadel  CC:  c/o chest pain in center of chest x2episodes lasting approx 2hrs.  describes as a sharp pain that does not radiate into the arm or into the jaw..  History of Present Illness: 70 y/o BF here for a follow up visit... she has mult med problems as listed in [facility]  September 04, 2009---Present to the office with a 2wks history of right sided back/shoulder blade pain.   Has taken OTC extra strength Tylenols and prescription Hydrocodone but didn't help.   Sore to touch, and raising arm. No known injury.  Denies dyspnea, coughing/hemoptysis, abd. pain, n/v/d, indigestion/heart burn, sore throat, or fever.     November 01, 2009--Presents for an acute office visit. Complains of chest pain in center of chest x2episodes lasting approx 2hrs.  describes as a sharp pain that does not radiate into the arm or into the jaw. Describes pain in mid sternal area, sharp with no radiation, denises diaphoresis, dyspnea, syncope, dizziness, exertional symptoms. This happened both times at bedtime. She is sore to touch in mid sternal. She has known CAD -non obstructive-cath 2/05 showed 20-60% obstructions in all 3 vessels... good LVF... ~  NuclearStressTest 3/09 was negative- no ischemia or infarction, EF=68%...  ~  last ROV w/ DrCrenshaw 3/10 reviewed- contin ASA/ Statin/ smoking cessation/ monitor BP & Chol. Denies  dyspnea, orthopnea, hemoptysis, fever, n/v/d, edema, headache,  GERD symptoms, belching, gas, bloody stools.       Preventive Screening-Counseling & Management  Alcohol-Tobacco     Smoking Status: current     Packs/Day: 1 pack per week  Medications Prior to Update: 1)  Nasonex 50 Mcg/act Susp (Mometasone Furoate) .... 2 Sprays in Each Nostril Two Times A Day. 2)  Symbicort 160-4.5 Mcg/act Aero (Budesonide-Formoterol Fumarate) .... 2 Puffs Two Times A Day 3)  Proair Hfa 108 (90 Base) Mcg/act   Aers (Albuterol Sulfate) .Marland Kitchen.. 1-2 Sprays Every 6 Hours As Needed For Wheezing... 4)  Adult Aspirin Ec Low Strength 81 Mg  Tbec (Aspirin) .... Take 1 Tablet By Mouth Once A Day 5)  Furosemide 20 Mg Tabs (Furosemide) .... Take 1 Tab By Mouth Once Daily.Marland KitchenMarland Kitchen 6)  Potassium Chloride Crys Cr 20 Meq Cr-Tabs (Potassium Chloride Crys Cr) .... Take 1 Tab By Mouth Once Daily.Marland KitchenMarland Kitchen 7)  Crestor 5 Mg  Tabs (Rosuvastatin Calcium) .... Take 1 Tablet By Mouth Once A Day 8)  Hydrocodone-Acetaminophen 5-325 Mg  Tabs (Hydrocodone-Acetaminophen) .Marland Kitchen.. 1 Tab By Mouth Q4h As Needed For Pain... 9)  Multivitamins   Tabs (Multiple Vitamin) .... Take 1 Tablet By Mouth Once A Day 10)  Robaxin 500 Mg Tabs (Methocarbamol) .Marland Kitchen.. 1 By Mouth Three Times A Day As Needed For  Muscle Spasms  Current Medications (verified): 1)  Nasonex 50 Mcg/act Susp (Mometasone Furoate) .... 2 Sprays in Each Nostril Two Times A Day. 2)  Symbicort 160-4.5 Mcg/act Aero (Budesonide-Formoterol Fumarate) .... 2 Puffs Two Times A Day 3)  Proair Hfa 108 (90 Base) Mcg/act  Aers (Albuterol Sulfate) .Marland Kitchen.. 1-2 Sprays Every 6 Hours As Needed For Wheezing... 4)  Adult Aspirin Ec Low Strength 81 Mg  Tbec (Aspirin) .... Take 1 Tablet By Mouth Once A Day 5)  Furosemide 20 Mg Tabs (Furosemide) .... Take 1 Tab By Mouth Once Daily.Marland KitchenMarland Kitchen 6)  Potassium Chloride Crys Cr 20 Meq Cr-Tabs (Potassium Chloride Crys Cr) .... Take 1 Tab By Mouth Once Daily.Marland KitchenMarland Kitchen 7)  Crestor  5 Mg  Tabs (Rosuvastatin Calcium) .... Take 1 Tablet By Mouth Once A Day 8)  Hydrocodone-Acetaminophen 5-325 Mg  Tabs (Hydrocodone-Acetaminophen) .Marland Kitchen.. 1 Tab By Mouth Q4h As Needed For Pain... 9)  Multivitamins   Tabs (Multiple Vitamin) .... Take 1 Tablet By Mouth Once A Day 10)  Robaxin 500 Mg Tabs (Methocarbamol) .Marland Kitchen.. 1 By Mouth Three Times A Day As Needed For  Muscle Spasms  Allergies (verified): No Known Drug Allergies  Past History:  Past Surgical History: Last updated: 04/18/2009 S/P Hysterectomy S/P CSpine  surg in 1993 for Arnold-Chiari Malformation S/P R knee arthroscopy 1/09 by DrBeane S/P right TKR by DrBeane 7/09  Family History: Last updated: 06-16-08 mother died age 58 from heart problems and stroke falther alive age 68  Social History: Last updated: 11/01/2009 retired 1 pack per week widowed 2 children  Risk Factors: Smoking Status: current (11/01/2009) Packs/Day: 1 pack per week (11/01/2009)  Past Medical History:  ALLERGIC RHINITIS (ICD-477.9) - on NASONEX, ASTEPRO, ZYRTEK, & allergy shots... CHRONIC OBSTRUCTIVE ASTHMA UNSPECIFIED (ICD-493.20) - she had +allergy skin testing for dust mites and started on allergy shots per DrESL along w/ SYMBICORT 160- 2spBid, but she doesn't take anything regularly... +smoker but decreased from 1/2 ppd x 86yrs to 1-2 cig/d.  ~  5/10:  "I have an appt w/ my asthma doctor soon"  Hx of HYPERTENSION (ICD-401.9) - off LISINOPRIL/Hct (ALLERGIC to ACE meds) & taking LASIX 20mg /d... BP=136/84 today and denies HA, fatigue, visual changes, CP, palipit, dizziness, syncope, dyspnea, edema, etc...  CAD (ICD-414.00) - known non-obstructive disease & atypic CP... on ASA 81mg /d, STATIN (Crestor 5), & off ACE.  ~  cath 2/05 showed 20-60% obstructions in all 3 vessels... good LVF...  ~  NuclearStressTest 3/09 was negative- no ischemia or infarction, EF=68%...  ~  last ROV w/ DrCrenshaw 3/10 reviewed- contin ASA/ Statin/ smoking cessation/ monitor BP & Chol.  VENOUS INSUFFICIENCY (ICD-459.81) - she follows a low salt diet... +LASIX 20mg /d for swelling...  ~  1/10: we discussed no salt, Lasix20, elevation, support hose.  HYPERCHOLESTEROLEMIA (ICD-272.0) - on CRESTOR 5mg /d-   ~  FLP 5/08 showed TChol 115, TG 83, HDL 50, LDL 49... tolerating well and taking med regularly.  ~  FLP 3/09 showed TChol 126, TG 104, HDL 56, LDL 49... rec- same.  ~  FLP 3/10 showed TChol 152, TG 97, HDL 77, LDL 55  OBESITY (ICD-278.00) - unable to diet effectively and get weight  down... weight today = 238#, 5\' 5"  tall,  BMI= 40... we discussed diet and exercise program... again!  GERD (ICD-530.81) - EGD 7/05 by DrStark w/ HH- now on PPI (PROTONIX 40mg /d) & H2 blocker (PEPCID 40mg /d) at bedtime...  ~  1/10: she stopped above meds  COLONIC POLYPS (ICD-211.3) - last colonoscopy was 12/03 showing several 3-39mm polyps (hyperplastic)...  HX OF GALLSTONE (ICD-V12.79) - seen on CT 7/06 and referred to [facility]- eval by DrCornett and being followed...  Social History: retired 1 pack per week widowed 2 childrenPacks/Day:  1 pack per week  Vital Signs:  Patient profile:   70 year old female Height:      65 inches Weight:      236.13 pounds BMI:     39.44 O2 Sat:      100 % on Room air Temp:     97.6 degrees F oral Pulse rate:   55 / minute  Vitals Entered By: Boone Master CNA (November 01, 2009 11:16 AM)  O2 Flow:  Room  air CC: c/o chest pain in center of chest x2episodes lasting approx 2hrs.  describes as a sharp pain that does not radiate into the arm or into the jaw. Is Patient Diabetic? No Comments Medications reviewed with patient Daytime contact number verified with patient. Boone Master CNA  November 01, 2009 11:20 AM    Physical Exam  Additional Exam:  GEN: A/Ox3; pleasant , NAD HEENT:  [place]/AT, , EACs-clear, TMs-wnl, NOSE-clear, THROAT-clear NECK:  Supple w/ fair ROM; no JVD; normal carotid impulses w/o bruits; no thyromegaly or nodules palpated; no lymphadenopathy. RESP  Clear to P & A; w/o, wheezes/ rales/ or rhonchi CARD:  RRR, no m/r/g  , mid sternal tenderness, no deformity noted, no eccymosis, or redness or rash.  GI:   Soft & nt; nml bowel sounds; no organomegaly or masses detected, no guardind, rebound,  Musco: Warm bil,  no calf tenderness edema, clubbing, pulses intact  Neuro: nml equal grips/strength  Skin: intact w/ no rash.    Impression & Recommendations:  Problem # 1:  CHEST PAIN (ICD-786.50)  Atypical chest pain ? etiology EKG  without acute changes.  Case/EKG reviewed w/ Dr. Kriste Basque  Suspect this may be reflux since most symptoms start at night +/- chest wall pain Prev endo w/ GERD prev tx w/ PPI and H2 blocker but stopped 1/10.  will refer to card since has several risk factors along w/ known non obstructive CAD on prev. cath cardioite 09 was neg for ischemia she has been advised on s/sx of MI REC:  We are referring you to cardiology to be seen to evaluate episodes of chest pain Begin Dexilant 60mg  once daily then begin prilosec 20mg  once daily (this is over the counter. )  GERD Diet Warm heat to chest wall as needed  Tylenol as needed pain If symptoms return or associated with dyspnea, sweating, radiation of pain call 911 to go to ER.  Please contact office for sooner follow up if symptoms do not improve or worsen  /follow up Dr. Kriste Basque in 4-6 weeks and as needed   Orders: Est. Patient Level IV ([address])  Complete Medication List: 1)  Nasonex 50 Mcg/act Susp (Mometasone furoate) .... 2 sprays in each nostril two times a day. 2)  Symbicort 160-4.5 Mcg/act Aero (Budesonide-formoterol fumarate) .... 2 puffs two times a day 3)  Proair Hfa 108 (90 Base) Mcg/act Aers (Albuterol sulfate) .Marland Kitchen.. 1-2 sprays every 6 hours as needed for wheezing... 4)  Adult Aspirin Ec Low Strength 81 Mg Tbec (Aspirin) .... Take 1 tablet by mouth once a day 5)  Furosemide 20 Mg Tabs (Furosemide) .... Take 1 tab by mouth once daily.Marland KitchenMarland Kitchen 6)  Potassium Chloride Crys Cr 20 Meq Cr-tabs (Potassium chloride crys cr) .... Take 1 tab by mouth once daily.Marland KitchenMarland Kitchen 7)  Crestor 5 Mg Tabs (Rosuvastatin calcium) .... Take 1 tablet by mouth once a day 8)  Hydrocodone-acetaminophen 5-325 Mg Tabs (Hydrocodone-acetaminophen) .Marland Kitchen.. 1 tab by mouth q4h as needed for pain.Marland KitchenMarland Kitchen 9)  Multivitamins Tabs (Multiple vitamin) .... Take 1 tablet by mouth once a day 10)  Robaxin 500 Mg Tabs (Methocarbamol) .Marland Kitchen.. 1 by mouth three times a day as needed for  muscle spasms  Patient  Instructions: 1)  We are referring you to cardiology to be seen to evaluate episodes of chest pain 2)  Begin Dexilant 60mg  once daily then begin prilosec 20mg  once daily (this is over the counter. )  3)  GERD Diet 4)  Warm heat to chest wall as  needed  5)  Tylenol as needed pain 6)  If symptoms return or associated with dyspnea, sweating, radiation of pain call 911 to go to ER.  7)  Please contact office for sooner follow up if symptoms do not improve or worsen  8)  /follow up Dr. Kriste Basque in 4-6 weeks and as needed    [facility] ECG  ID: [id number] Patient: [name], [name] DOB: May 17, 1941 Age: 70 Years Old Sex: Female Race: Black Physician: Rubye Oaks, N.P. PCP: Alroy Dust, M.D. Technician: Boone Master CNA Height: 65 Weight: 236.13 Status: Unconfirmed Past Medical History:  ALLERGIC RHINITIS (ICD-477.9) CHRONIC OBSTRUCTIVE ASTHMA UNSPECIFIED (ICD-493.20) HYPERTENSION (ICD-401.9) CAD (ICD-414.00) VENOUS INSUFFICIENCY (ICD-459.81) HYPERCHOLESTEROLEMIA (ICD-272.0) OBESITY (ICD-278.00) GERD (ICD-530.81) COLONIC POLYPS (ICD-211.3) HX OF GALLSTONE (ICD-V12.79) DEGENERATIVE JOINT DISEASE (ICD-715.90) BACK PAIN, LUMBAR (ICD-724.2) 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) SEIZURES, HX OF (ICD-V12.49) 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) LEG CRAMPS, NOCTURNAL (ICD-729.82)   Recorded: 11/01/2009 11:32 AM P/PR: 113 ms / 152 ms - Heart rate (maximum exercise) QRS: 90 QT/QTc/QTd: 451 ms / 453 ms / 68 ms - Heart rate (maximum exercise)  P/QRS/T axis: 52 deg / 0 deg / 3 deg - Heart rate (maximum exercise)  Heartrate: 61 bpm  Interpretation:   sinus rhythm (slow)  minor inferior repolarization disturbance, consider ischemia, LV overload or aspecific change   flat or low negative T in aVF    with negative T in III   ECG without significant abnormalities

## 2010-12-31 NOTE — Progress Notes (Signed)
Summary: Nuclear Pre-Procedure  Phone Note Outgoing Call Call back at [facility] Phone [phone]   Call placed by: Stanton Kidney, EMT-P,  November 15, 2009 9:25 AM Call placed to: Patient Action Taken: Phone Call Completed Summary of Call: Reviewed information on [employer] Information Sheet (see scanned document for further details).  Spoke with Pt.    Nuclear Med Background Indications for Stress Test: Evaluation for Ischemia   History: Asthma, COPD, Heart Catheterization, Myocardial Perfusion Study  History Comments: '05 Heart Cath: 60% Ostial LAD lesion 3/09 MPS: NL, EF=68%  Symptoms: Chest Pain    Nuclear Pre-Procedure Cardiac Risk Factors: Family History - CAD, Hypertension, Lipids, Smoker Height (in): 65

## 2010-12-31 NOTE — Progress Notes (Signed)
Summary: cough and congestion  LMTCBx2  Phone Note Call from Patient   Caller: Patient Call For: nadel Summary of Call: cough and  congestion pharmacy [facility] Initial call taken by: Rickard Patience,  February 12, 2009 9:17 AM  Follow-up for Phone Call        [facility] TCB for further symptoms   LMOMTCBx2.  Arman Filter LPN  February 13, 2009 2:35 PM  Follow-up by: Abigail Miyamoto RN,  February 12, 2009 11:08 AM  Additional Follow-up for Phone Call Additional follow up Details #1::        Spoke with pt.  She c/o prod cough with clear sputum and runny nose x 4 days.  Would like rx called in.  NKDA. Please advise, thanks! Additional Follow-up by: Vernie Murders,  February 13, 2009 3:42 PM    Additional Follow-up for Phone Call Additional follow up Details #2::    per SN--- ok for pt to have zpak  #1  take as directed no refills Marijo File CMA  February 13, 2009 5:18 PM   Rx was sent to pharm.  Spoke with pt and made aware. Follow-up by: Vernie Murders,  February 13, 2009 5:22 PM  New/Updated Medications: ZITHROMAX Z-PAK 250 MG TABS (AZITHROMYCIN) take as directed   Prescriptions: ZITHROMAX Z-PAK 250 MG TABS (AZITHROMYCIN) take as directed  #1 x 0   Entered by:   Vernie Murders   Authorized by:   Michele Mcalpine MD   Signed by:   Vernie Murders on 02/13/2009   Method used:   [facility]        [facility] [phone]* (retail)       [address]       [place], [place]  [address]       Ph: [phone] or [phone]       Fax: [phone]   RxID:   [id number]

## 2010-12-31 NOTE — Progress Notes (Signed)
Summary: Need substitute for Skelaxin  Phone Note From Pharmacy Call back at [phone]   [facility] [address] [phone]* Call For: Parrett  Summary of Call: Generic Skelaxin on long term back order - Need alternative - suggest robaxin Initial call taken by: Eugene Gavia,  September 05, 2009 9:28 AM  Follow-up for Phone Call        Please advise thanks! Vernie Murders  September 05, 2009 9:54 AM   Additional Follow-up for Phone Call Additional follow up Details #1::        robaxin 500mg  three times a day as needed muscle spasm, #30 no refills.  Additional Follow-up by: Rubye Oaks NP,  September 05, 2009 10:04 AM    Additional Follow-up for Phone Call Additional follow up Details #2::    New RX called to pharmacy.Michel Bickers CMA  September 05, 2009 10:20 AM  New/Updated Medications: ROBAXIN 500 MG TABS (METHOCARBAMOL) 1 by mouth three times a day as needed for  muscle spasms Prescriptions: ROBAXIN 500 MG TABS (METHOCARBAMOL) 1 by mouth three times a day as needed for  muscle spasms  #30 x 0   Entered by:   Michel Bickers CMA   Authorized by:   Rubye Oaks NP   Signed by:   Michel Bickers CMA on 09/05/2009   Method used:   Telephoned to ...       [facility] [address] [phone]* (retail)       [address]       [place], [place]  [address]       Ph: [phone] or [phone]       Fax: [phone]   RxID:   [id number]

## 2010-12-31 NOTE — Assessment & Plan Note (Signed)
Summary: PER LEIGH/MH  Medications Added PROMETHAZINE-CODEINE 6.25-10 MG/5ML  SYRP (PROMETHAZINE-CODEINE) take 1 tsp by mouth every 4-6 hours as needed for cough...      Allergies Added: NKDA  Chief Complaint:  Medical clearance for TKR....  History of Present Illness: 70 y/o BF here for a follow up visit and medical clearance for planned Right TKR by DrBeane... she has mult med problems as listed below... she had a right knee arthroscopy 1/09 by DrBeane... she saw DrCrenshaw for cardiac f/u in Jan09 and 02/21/08- his notes are reviewed...   Current Problem List;  HYPERTENSION (ICD-401.9) - on LISINOPRIL/Hct 20-12.5 daily... BP=100/60 and feeling Ok, denies HA, fatigue, visual changes, CP, palipit, dizziness, syncope, dyspnea, edema, etc...  CAD (ICD-414.00) - known non-obstructive disease... on ASA 81mg /d, ACE (Lisinopril 20), STATIN (Crestor 5)...  ~  cath 2/05 showed 20-60% obstructions in all 3 vessels... good LVF...  ~  NuclearStressTest 3/09 was negative- no ischemia or infarction, EF=68%...  VENOUS INSUFFICIENCY (ICD-459.81) - she follows a low salt diet and the HCTZ helps...  HYPERCHOLESTEROLEMIA (ICD-272.0) - on CRESTOR 5mg /d-   ~ last FLP 5/08 showed TChol 115, TG 83, HDL 50, LDL 49... tolerating well and taking med regularly.  OBESITY (ICD-278.00) - unable to diet effectively and get weight down... 222#, we discussed diet and exercise program...  GERD (ICD-530.81) - EGD 7/05 by DrStark w/ HH- not currently on meds.  COLONIC POLYPS (ICD-211.3) - last colonoscopy was 12/03 showing several 3-7mm polyps (hyperplastic)...  HX OF GALLSTONE (ICD-V12.79) - seen on CT 7/06 and referred to [facility]- eval by DrCornett and being followed...  DEGENERATIVE JOINT DISEASE (ICD-715.90) - followed by DrBeane for ortho... Right knee arthroscopy 1/09 without help... now to sched RTKR...  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    Vital Signs:  Patient Profile:   70 Years Old Female Weight:      222.13 pounds O2 Sat:      99 % O2 treatment:    Room Air Temp:     97.2 degrees F oral Pulse rate:   50 / minute BP sitting:   100 / 60  (left arm)  Vitals Entered By: Marijo File CMA (Mar 31, 2008 3:18 PM)                 Physical Exam  WD, Overweight, 70 y/o  BF in NAD... GENERAL:  Alert & oriented; pleasant & cooperative. HEENT:  [place]/AT, EOM-wnl, PERRLA, EACs-clear, TMs-wnl, NOSE-clear, THROAT-clear & wnl. NECK:  Supple w/ decrROM; no JVD; normal carotid impulses w/o bruits; no thyromegaly or nodules palpated; no lymphadenopathy. CHEST:  Clear to P & A; without wheezes/ rales/ or rhonchi. HEART:  Regular Rhythm, gr 1/6 SEM without rubs or gallops... ABDOMEN:  Obese, soft & nontender; normal bowel sounds; no organomegaly or masses detected. EXT: without deformities, s/p right knee surg, mod arthritic changes; no varicose veins/ +venous insuffic/ tr  edema. NEURO:  CN's intact; no focal neuro deficits... DERM:  No lesions noted; no rash etc...       Impression & Recommendations:  Problem # 1:  DEGENERATIVE JOINT DISEASE (ICD-715.90) She has severe right knee difficulty and is sched for right TKR... OK for surgery. Her updated medication list for this problem includes:    Adult Aspirin Ec Low Strength 81 Mg Tbec (Aspirin) .Marland Kitchen... Take 1 tablet by mouth once a day    Etodolac 400 Mg Tabs (Etodolac) .Marland Kitchen... 1 tab by mouth two times a day w/ food as needed for arthritis pain...    Hydrocodone-acetaminophen 5-325 Mg Tabs (Hydrocodone-acetaminophen) .Marland Kitchen... 1 tab by mouth q4h as needed for pain...   Problem # 2:  HYPERTENSION (ICD-401.9) Assessment: Unchanged Stable... continue med. Her updated medication list for this problem includes:    Lisinopril-hydrochlorothiazide 20-12.5 Mg Tabs (Lisinopril-hydrochlorothiazide) .Marland Kitchen... Take 1 tablet by mouth once a day   Problem # 3:  CAD (ICD-414.00) Assessment: Unchanged Stable... she has been given cardiac clearance as well... Her updated medication list for this problem includes:    Adult Aspirin Ec Low Strength 81 Mg Tbec (Aspirin) .Marland Kitchen... Take 1 tablet by mouth once a day    Lisinopril-hydrochlorothiazide 20-12.5 Mg Tabs (Lisinopril-hydrochlorothiazide) .Marland Kitchen... Take 1 tablet by mouth once a day   Problem # 4:  HYPERCHOLESTEROLEMIA (ICD-272.0) Assessment: Unchanged Stable... continue same med. Her updated medication list for this problem includes:    Crestor 5 Mg Tabs (Rosuvastatin calcium) .Marland Kitchen... Take 1 tablet by mouth once a day   Complete Medication List: 1)  Proair Hfa 108 (90 Base) Mcg/act Aers (Albuterol sulfate) .Marland Kitchen.. 1-2 sprays every 6 hours as needed for wheezing... 2)  Adult Aspirin Ec Low Strength 81 Mg Tbec (Aspirin) .... Take 1 tablet by mouth once a day 3)  Lisinopril-hydrochlorothiazide 20-12.5 Mg Tabs (Lisinopril-hydrochlorothiazide) .... Take 1 tablet by mouth once a day 4)   Potassium Chloride Crys Cr 20 Meq Tbcr (Potassium chloride crys cr) .Marland Kitchen.. 1 tab daily as directed... 5)  Crestor 5 Mg Tabs (Rosuvastatin calcium) .... Take 1 tablet by mouth once a day 6)  Etodolac 400 Mg Tabs (Etodolac) .Marland Kitchen.. 1 tab by mouth two times a day w/ food as needed for arthritis pain.Marland KitchenMarland Kitchen 7)  Hydrocodone-acetaminophen 5-325 Mg Tabs (Hydrocodone-acetaminophen) .Marland Kitchen.. 1 tab by mouth q4h as needed for pain.Marland KitchenMarland Kitchen 8)  Multivitamins Tabs (Multiple vitamin) .... Take 1 tablet by mouth once a day 9)  Promethazine-codeine 6.25-10 Mg/55ml Syrp (Promethazine-codeine) .... Take 1 tsp by mouth every 4-6 hours as needed for cough...   Patient Instructions: 1)  Continue your present meds.Marland KitchenMarland Kitchen 2)  We have given you a new perscription for a cough syrup- use as directed...  3)  We will send an OK to [facility] for your knee surgery.    Prescriptions: PROMETHAZINE-CODEINE 6.25-10 MG/5ML  SYRP (PROMETHAZINE-CODEINE) take 1 tsp by mouth every 4-6 hours as needed for cough...  #8 oz x 5  Entered and Authorized by:   Michele Mcalpine MD   Signed by:   Michele Mcalpine MD on 03/31/2008   Method used:   Print then Give to Patient   RxID:   [id number]  ]

## 2010-12-31 NOTE — Progress Notes (Signed)
Summary: returned call  Phone Note Call from Patient Call back at Home Phone [phone]   Caller: Patient Call For: tammy parrett Summary of Call: pt states that someone called her today. (she saw tp yesterday).  Initial call taken by: Tivis Ringer,  November 02, 2009 2:40 PM  Follow-up for Phone Call        Spoke with pt; states she is aware of appt time and date that was given to her husband yesterday by Abigail Miyamoto. Reynaldo Minium CMA  November 02, 2009 2:46 PM

## 2010-12-31 NOTE — Letter (Signed)
Summary: [facility]  [facility]   Imported By: Lanelle Bal 11/09/2008 09:22:04  _____________________________________________________________________  External Attachment:    Type:   Image     Comment:   External Document

## 2010-12-31 NOTE — Progress Notes (Signed)
Summary: swelling  Phone Note Call from Patient   Caller: Patient Call For: nadel Summary of Call: pt's legs swelling since she is no longer taking lisinopril Initial call taken by: Rickard Patience,  July 28, 2008 8:31 AM  Follow-up for Phone Call        Pt saw SN on 05/17/08 d/c lisinopril/HCT and started Lasix 20 as needed. Attempted to call pt back is she taking her lasix as needed?  LMTCB Follow-up by: Cloyde Reams RN,  July 28, 2008 10:04 AM  Additional Follow-up for Phone Call Additional follow up Details #1::        Pt called back advised of SN recommendations per his office visit note 05/17/08. Pt states she does not have any furosemide and never filled rx given at [facility].  [facility]  Will send rx electronically. Pt aware to take once daily as needed for swelling that does not go down overnight. Additional Follow-up by: Cloyde Reams RN,  July 28, 2008 10:44 AM      Prescriptions: FUROSEMIDE 20 MG  TABS (FUROSEMIDE) take 1 tab by mouth once daily as needed for swelling that doesn't go down overnight...  #30 x prn   Entered by:   Cloyde Reams RN   Authorized by:   Michele Mcalpine MD   Signed by:   Cloyde Reams RN on 07/28/2008   Method used:   [facility]        [facility] [address] [phone]* (retail)       [address]       [place], [place]  [address]       Ph: [phone] or [phone]       Fax: [phone]   RxID:   [id number]

## 2010-12-31 NOTE — Assessment & Plan Note (Signed)
Summary: rov 3-4 months///kp   Primary Care Provider:  Elena Davia  CC:  3 month ROV & follow up....  History of Present Illness: 70 y/o BF here for a follow up of her mult med problems including: Allergies, HBP, CAD, Chol, Obesity, etc... she is also followed by DrCrenshaw for Cards, & DrESL for allergies...   ~  December 10, 2009:  she had some CP w/ f/u eval by DrCrenshaw- NEG- see details below... BP under good control, as is the Chol on Cres5mg /d... weight is down 10# but she's had incr difficulty w/ left knee pain & will check w/ DrBeane re: synvisc vs cortisone shot...   ~  June 10, 2010:  she apparently applied for job at [employer] routine PPD was pos- sent to the health dept Tb clinic & CXR was neg> started on INH (rec for 47month course- started 2/11 til 11/11) & they do labs ?monthly... we have not received any records & will request these to be sent to [place] for review... her breathing has been good- no new complaints; BP controlled on meds;  denies CP/ angina but still too sedentary & weight is UP 4#;  asking for new chol med since Cres5 is "too$$"- we discussed trial PRAVACHOL40... finally she notes DrBeane indicated that she needs left TKR as well but she is holding off...   ~  September 20, 2010:  wer still haven't received any records from health dept- they check her monthly & give her the INH & sheis due to complete the course 11/11... she denies cough, sputum, hemoptysis, CP, ch in DOB/ DOE... states she quit smoking last night!!!  BP remains controlled on med;  she saw DrCrenshaw 7/11- doing well, no changes made, f/u 31yr;  needs to ret fasting to check FLP on Prav40... OK Flu shot today.   Current Problem List:  ALLERGIC RHINITIS (ICD-477.9) - on NASONEX, ASTEPRO, ZYRTEK, & allergy shots... POSITIVE PPD (ICD-795.5) CHRONIC OBSTRUCTIVE ASTHMA UNSPECIFIED (ICD-493.20) - she had +allergy skin testing for dust mites and started on allergy shots per DrESL along w/ SYMBICORT 160-  2spBid, but she doesn't take anything regularly... +smoker but decreased from 1/2 ppd x 33yrs to 1-2 cig/d.  ~  2/11: she reports +PPD on pre-employment testing> sent to health dept & started on INH 300mg /d x94mo.  ~  CXR 2/11 showed borderline heart size, tort Ao, sl incr markings, NAD...  Hx of HYPERTENSION (ICD-401.9) - off Lisinopril/Hct (Hx ACE reaction) & taking LASIX 20mg /d... BP=132/84 today and denies HA, fatigue, visual changes, CP, palipit, dizziness, syncope, dyspnea, edema, etc...  CAD (ICD-414.00) - known non-obstructive disease & atypic CP... on ASA 81mg /d, STATIN (Crestor 5), & off ACE.  ~  cath 2/05 showed 20-60% obstructions in all 3 vessels... good LVF...  ~  NuclearStressTest 3/09 was negative- no ischemia or infarction, EF=68%...  ~  Eval for recurrent CP- EKG showed NSR, NAD;  2DEcho showed mild focal basal septal hypertrophy, norm wall motion w/ EF= 55-65%, mild DD & PAsys= 37;  Myoview NEG- no scar, no ischemia, EF=64%...  ~  ROV w/ DrCrenshaw 7/11 reviewed- continue ASA/ Statin/ smoking cessation/ monitor BP & Chol.  VENOUS INSUFFICIENCY (ICD-459.81) - she follows a low salt diet... +LASIX 20mg /d for swelling...  ~  1/10: we discussed no salt, Lasix20, elevation, support hose.  HYPERCHOLESTEROLEMIA (ICD-272.0) - on CRESTOR 5mg /d-   ~  FLP 5/08 on Cres5 showed TChol 115, TG 83, HDL 50, LDL 49... tolerating well and taking med regularly.  ~  FLP 3/09 on Cres5 showed TChol 126, TG 104, HDL 56, LDL 49... rec- same.  ~  FLP 3/10 on Cres5 showed TChol 152, TG 97, HDL 77, LDL 55  ~  FLP 12/10 on Cres5 showed TChol 127, TG 100, HDL 52, LDL 55  ~  7/11:  pt requests change to less expensive statin> try PRAVASTATIN40  ~  FLP 10/11 on Prav40 showed = pending.  OBESITY (ICD-278.00) - unable to diet effectively and get weight down...   ~  weight 5/10 = 238#, 5\' 5"  tall,  BMI= 40... we discussed diet and exercise program... again!  ~  weight 1/11 = 228#  ~  weight 7/11 = 232#  ~   weight 10/11 = 230#  GERD (ICD-530.81) - EGD 7/05 by DrStark w/ HH- supposed to be on PPI (Omeprazole 20mg ) & H2 blocker (PEPCID 20mg ) at bedtime...  ~  1/10: she stopped above meds & is recommended to restart Rx.  ~  1/11:  still not taking acid suppression meds.  COLONIC POLYPS (ICD-211.3) - last colonoscopy was 12/03 showing several 3-9mm polyps (hyperplastic)...  HX OF GALLSTONE (ICD-V12.79) - seen on CT 7/06 and referred to [facility]- eval by DrCornett and being followed...  DEGENERATIVE JOINT DISEASE (ICD-715.90) - followed by DrBeane for ortho... Right knee arthroscopy 1/09 without help... s/p right TKR 06/12/08 by Tora Perches... 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 she is off the Etodolac & takes VICODIN Prn...  ~  1/11: now complaining of left knee pain & will f/u w/ DrBeane...  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   Preventive Screening-Counseling & Management  Alcohol-Tobacco     Smoking Status: current     Packs/Day: 1 pack per week  Comments: pt stated that she quit today---09-20-2010  Allergies (verified): No Known Drug Allergies  Comments:  Nurse/Medical Assistant: The patient's medications and allergies were reviewed with the patient and were updated in the Medication and Allergy Lists.  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 mother died age 62 from heart problems and stroke falther alive age 76  Social History: Reviewed history from 06/10/2010 and no changes required. retired 1 pack per week widowed 2 children  Review of Systems      See HPI       The patient complains of dyspnea on exertion.  The patient denies anorexia, fever, weight loss, weight gain, vision loss, decreased hearing, hoarseness, chest pain, syncope, peripheral edema, prolonged cough, headaches, hemoptysis, abdominal pain, melena, hematochezia, severe indigestion/heartburn, hematuria, incontinence, muscle weakness, suspicious skin lesions, transient blindness, difficulty walking, depression, unusual weight change, abnormal bleeding, enlarged lymph nodes, and angioedema.    Vital Signs:  Patient profile:   70 year old female Height:      65 inches  Weight:      230 pounds BMI:     38.41 O2 Sat:      98 % on Room air Temp:     98.6 degrees F oral Pulse rate:   60 / minute BP sitting:   132 / 84  (left arm) Cuff size:   regular  Vitals Entered By: Randell Loop CMA (September 20, 2010 11:01 AM)  O2 Sat at Rest %:  98 O2 Flow:  Room air CC: 3 month ROV & follow up... Is Patient Diabetic? No Pain Assessment Patient in pain? no      Comments meds updated today   Physical Exam  Additional Exam:  WD, Overweight, 69 y/o BF in NAD... GENERAL:  Alert & oriented; pleasant & cooperative... HEENT:  [place]/AT, EOM-wnl, PERRLA, EACs-clear, TMs-wnl, NOSE-clear, THROAT-clear & wnl, no lesions seen; VOICE- sl hoarse. NECK:  Supple w/ decrROM; no JVD; normal carotid impulses  w/o bruits; no thyromegaly or nodules palpated; no lymphadenopathy. CHEST:  Clear to P & A; without wheezes/ rales/ or rhonchi heard... HEART:  Regular Rhythm, gr 1/6 SEM without rubs or gallops... ABDOMEN:  Obese, soft & nontender; normal bowel sounds; no organomegaly or masses detected. EXT:  s/p right TKR, mod arthritic changes; scattered  varicose veins/ +venous insuffic/ no  edema... NEURO:  CN's intact; no focal neuro deficits... DERM:  No lesions noted; no rash etc...    Impression & Recommendations:  Problem # 1:  POSITIVE PPD (ICD-795.5) Followed at [facility] & sched to complete 61mo of INH 11/11... must quit all smoking!  Problem # 2:  HYPERTENSION (ICD-401.9) Stable on diet + diuretic Rx... Her updated medication list for this problem includes:    Furosemide 20 Mg Tabs (Furosemide) .Marland Kitchen... Take 1 tab by mouth once daily...  Problem # 3:  CAD (ICD-414.00) Followed by Aletta Edouard for Cards-  seen 7/11 and stable, continue same Rx. Her updated medication list for this problem includes:    Adult Aspirin Ec Low Strength 81 Mg Tbec (Aspirin) .Marland Kitchen... Take 1 tablet by mouth once a day    Furosemide 20 Mg Tabs (Furosemide) .Marland Kitchen... Take 1 tab by mouth once daily...  Problem # 4:  HYPERCHOLESTEROLEMIA (ICD-272.0) She will ret next week for FLP on the Prav40... Her updated medication list for this problem includes:    Pravastatin Sodium 40 Mg Tabs (Pravastatin sodium) .Marland Kitchen... Take 1 tab by mouth at bedtime for cholesterol...  Problem # 5:  OBESITY (ICD-278.00) We discussed diet/ exercise/ get weight down...  Problem # 6:  GERD (ICD-530.81) GI is stable>  continue Rx...  Problem # 7:  DEGENERATIVE JOINT DISEASE (ICD-715.90) Followed by DrBeane & needs TKR... she is holding off for now... Her updated medication list for this problem includes:    Adult Aspirin Ec Low Strength 81 Mg Tbec (Aspirin) .Marland Kitchen... Take 1 tablet by mouth once a day    Hydrocodone-acetaminophen 5-325 Mg Tabs  (Hydrocodone-acetaminophen) .Marland Kitchen... 1 tab by mouth every 6-8 h as needed for pain...  Problem # 8:  OTHER MEDICAL PROBLEMS AS NOTED>>> OK Flu shot today...  Complete Medication List: 1)  Nasonex 50 Mcg/act Susp (Mometasone furoate) .... 2 sprays in each nostril two times a day. 2)  Astepro 0.15 % Soln (Azelastine hcl) .... 2 sprays each nostril once daily as needed 3)  Symbicort 160-4.5 Mcg/act Aero (Budesonide-formoterol fumarate) .... 2 puffs two times a day 4)  Adult Aspirin Ec Low Strength 81 Mg Tbec (Aspirin) .... Take 1 tablet by mouth  once a day 5)  Furosemide 20 Mg Tabs (Furosemide) .... Take 1 tab by mouth once daily.Marland KitchenMarland Kitchen 6)  Potassium Chloride Crys Cr 20 Meq Cr-tabs (Potassium chloride crys cr) .... Take 1 tab by mouth once daily.Marland KitchenMarland Kitchen 7)  Pravastatin Sodium 40 Mg Tabs (Pravastatin sodium) .... Take 1 tab by mouth at bedtime for cholesterol... 8)  Hydrocodone-acetaminophen 5-325 Mg Tabs (Hydrocodone-acetaminophen) .Marland Kitchen.. 1 tab by mouth every 6-8 h as needed for pain.Marland KitchenMarland Kitchen 9)  Multivitamins Tabs (Multiple vitamin) .... Take 1 tablet by mouth once a day 10)  Isoniazid 300 Mg Tabs (Isoniazid) .Marland Kitchen.. 1 once daily  Other Orders: Flu Vaccine 64yrs + MEDICARE PATIENTS (N8295) Administration Flu vaccine - MCR (A2130)  Patient Instructions: 1)  Today we updated your med list- see below.... 2)  We refilled your meds per request... 3)  We gave you the 2011 Flu vaccine... 4)  Please retrun to our lab one morning next week for your FASTING blood work... then please call the "phone tree" in a few days for your lab results.Marland KitchenMarland Kitchen 5)  Call for any problems, otherwise let's plan a follow up visit in 6 months... Prescriptions: HYDROCODONE-ACETAMINOPHEN 5-325 MG  TABS (HYDROCODONE-ACETAMINOPHEN) 1 tab by mouth every 6-8 H as needed for pain...  #100 x 5   Entered and Authorized by:   Michele Mcalpine MD   Signed by:   Michele Mcalpine MD on 09/20/2010   Method used:   Print then Give to Patient   RxID:    [id number] PRAVASTATIN SODIUM 40 MG TABS (PRAVASTATIN SODIUM) take 1 tab by mouth at bedtime for cholesterol...  #30 x 12   Entered and Authorized by:   Michele Mcalpine MD   Signed by:   Michele Mcalpine MD on 09/20/2010   Method used:   Print then Give to Patient   RxID:   [id number] POTASSIUM CHLORIDE CRYS CR 20 MEQ CR-TABS (POTASSIUM CHLORIDE CRYS CR) take 1 tab by mouth once daily...  #30 x 12   Entered and Authorized by:   Michele Mcalpine MD   Signed by:   Michele Mcalpine MD on 09/20/2010   Method used:   Print then Give to Patient   RxID:   [id number] FUROSEMIDE 20 MG TABS (FUROSEMIDE) take 1 tab by mouth once daily...  #30 x 12   Entered and Authorized by:   Michele Mcalpine MD   Signed by:   Michele Mcalpine MD on 09/20/2010   Method used:   Print then Give to Patient   RxID:   [id number]    Immunization History:  Influenza Immunization History:    Influenza:  historical (09/12/2009)   Flu Vaccine Consent Questions     Do you have a history of severe allergic reactions to this vaccine? no    Any prior history of allergic reactions to egg and/or gelatin? no    Do you have a sensitivity to the preservative Thimersol? no    Do you have a past history of Guillan-Barre Syndrome? no    Do you currently have an acute febrile illness? no    Have you ever had a severe reaction to latex? no    Vaccine information given and explained to patient? yes    Are you currently pregnant? no    Lot Number:[id number]   Exp Date:05/31/2011   Site Given  Left Deltoid IMflu1 Randell Loop [facility]  September 20, 2010 11:50 AM

## 2010-12-31 NOTE — Assessment & Plan Note (Signed)
Summary: Primary svc/ acute ext ov/ rhinitis rx reviewed   Primary Provider/Referring Provider:  scott nadel  CC:  Acute visit.  Pt c/o HA x 2 days- had some nasal congestion and bloody nasal d/c yesterday.  [name]  History of Present Illness: 64 yobf smoker with  mult med problems as listed in [facility]  September 04, 2009---Present to the office with a 2wks history of right sided back/shoulder blade pain.   Has taken OTC extra strength Tylenols and prescription Hydrocodone but didn't help.   Sore to touch, and raising arm. No known injury.  Denies dyspnea, coughing/hemoptysis, abd. pain, n/v/d, indigestion/heart burn, sore throat, or fever.     November 01, 2009--Presents for an acute office visit. Complains of chest pain in center of chest x2episodes lasting approx 2hrs.  describes as a sharp pain that does not radiate into the arm or into the jaw. Describes pain in mid sternal area, sharp with no radiation, denises diaphoresis, dyspnea, syncope, dizziness, exertional symptoms. This happened both times at bedtime. She is sore to touch in mid sternal. She has known CAD -non obstructive-cath 2/05 showed 20-60% obstructions in all 3 vessels... good LVF... ~  NuclearStressTest 3/09 was negative- no ischemia or infarction, EF=68%...  ~  last ROV w/ DrCrenshaw 3/10 reviewed- contin ASA/ Statin/ smoking cessation/ monitor BP & Chol.   February 22, 2010 Acute visit.  Pt c/o HA x 2 days- had some nasal congestion and bloody nasal d/c yesterday.  Has nasonex but doesn't use it.  no viz, no nausea. Pain over bridge of nose no purulent nasal discharge.  Also c/o  slt bloody vaginal discharge  x when wiped. no dysruia.  Pt denies any significant sore throat, dysphagia, itching, sneezing,  fever, chills, sweats, unintended wt loss, pleuritic or exertional cp, hempoptysis, change in activity tolerance  orthopnea pnd or leg swelling.  Pt also denies any obvious fluctuation in symptoms with weather or environmental change or  other alleviating or aggravating factors.           Current Medications (verified): 1)  Nasonex 50 Mcg/act Susp (Mometasone Furoate) .... 2 Sprays in Each Nostril Two Times A Day. 2)  Symbicort 160-4.5 Mcg/act Aero (Budesonide-Formoterol Fumarate) .... 2 Puffs Two Times A Day 3)  Proair Hfa 108 (90 Base) Mcg/act  Aers (Albuterol Sulfate) .[name].. 1-2 Sprays Every 6 Hours As Needed For Wheezing... 4)  Adult Aspirin Ec Low Strength 81 Mg  Tbec (Aspirin) .... Take 1 Tablet By Mouth Once A Day 5)  Furosemide 20 Mg Tabs (Furosemide) .... Take 1 Tab By Mouth Once Daily.April KitchenMarland [name] 6)  Potassium Chloride Crys Cr 20 Meq Cr-Tabs (Potassium Chloride Crys Cr) .... Take 1 Tab By Mouth Once Daily.April KitchenMarland [name] 7)  Crestor 10 Mg Tabs (Rosuvastatin Calcium) .... Take As Directed For Cholesterol... 8)  Hydrocodone-Acetaminophen 5-325 Mg  Tabs (Hydrocodone-Acetaminophen) .[name].. 1 Tab By Mouth Q4h As Needed For Pain... 9)  Multivitamins   Tabs (Multiple Vitamin) .... Take 1 Tablet By Mouth Once A Day 10)  Isoniazid 300 Mg Tabs (Isoniazid) .[name].. 1 Once Daily 11)  Astepro 0.15 % Soln (Azelastine Hcl) .... 2 Sprays Each Nostril Once Daily As Needed  Allergies (verified): No Known Drug Allergies  Past History:  Past Medical History: ALLERGIC RHINITIS (ICD-477.9)     - Non compliant with nasal steroids     - Afrin x 5 days only as needed added February 22, 2010  CHRONIC OBSTRUCTIVE ASTHMA UNSPECIFIED (ICD-493.20) TOBACCO ABUSE (ICD-305.1) HYPERTENSION (ICD-401.9) CAD (ICD-414.00)  VENOUS INSUFFICIENCY (ICD-459.81) HYPERCHOLESTEROLEMIA (ICD-272.0) OBESITY (ICD-278.00) GERD (ICD-530.81) COLONIC POLYPS (ICD-211.3) HX OF GALLSTONE (ICD-V12.79) DEGENERATIVE JOINT DISEASE (ICD-715.90) BACK PAIN, LUMBAR (ICD-724.2) 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) SEIZURES, HX OF (ICD-V12.49) 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) LEG CRAMPS, NOCTURNAL (ICD-729.82)  Vital Signs:  Patient profile:   70 year old female Weight:       227 pounds O2 Sat:      99 % on Room air Temp:     97.9 degrees F oral Pulse rate:   59 / minute BP sitting:   110 / 70  (left arm) Cuff size:   large  Vitals Entered By: Vernie Murders (February 22, 2010 11:42 AM)  O2 Flow:  Room air  Physical Exam  Additional Exam:  GEN: A/Ox3; pleasant , NAD wt 228 > 227 February 22, 2010  HEENT:  [place]/AT, , EACs-clear, TMs-wnl, NOSE-clear, THROAT-clear, moderate non specific bilateral turbinate edema  NECK:  Supple w/ fair ROM; no JVD; normal carotid impulses w/o bruits; no thyromegaly or nodules palpated; no lymphadenopathy. RESP  Clear to P & A; w/o, wheezes/ rales/ or rhonchi CARD:  RRR, no m/r/g  , mid sternal tenderness, no deformity noted, no eccymosis, or redness or rash.  GI:   Soft & nt; nml bowel sounds; no organomegaly or masses detected, no guarding,  rebound,  [place]: Warm bil,  no calf tenderness edema, clubbing, pulses intact     Impression & Recommendations:  Problem # 1:  ALLERGIC RHINITIS (ICD-477.9)  Her updated medication list for this problem includes:    Nasonex 50 Mcg/act Susp (Mometasone furoate) .[name]... 2 sprays in each nostril two times a day.    Astepro 0.15 % Soln (Azelastine hcl) .[name]... 2 sprays each nostril once daily as needed  I emphasized that nasal steroids have no immediate benefit in terms of improving symptoms.  To help them reached the target tissue, the patient should use Afrin two puffs every 12 hours applied one min before using the nasal steroids.  Afrin should be stopped after no more than 5 days.  If the symptoms worsen, Afrin can be restarted after 5 days off of therapy to prevent rebound congestion from overuse of Afrin.  I also emphasized that in no way are nasal steroids a concern in terms of "addiction".    Orders: Est. Patient Level IV (16109)  Problem # 2:  VAGINAL DISCHARGE (ICD-623.5)  No symptoms and she is s/p hysterectomy so needs f/u gyn if persists but in meantime no aspirin products as long as  notices bleeding   Each maintenance medication was reviewed in detail including most importantly the difference between maintenance and as needed and under what circumstances the prns are to be used. See instructions for specific recommendations   Medications Added to Medication List This Visit: 1)  Isoniazid 300 Mg Tabs (Isoniazid) .[name].. 1 once daily 2)  Astepro 0.15 % Soln (Azelastine hcl) .... 2 sprays each nostril once daily as needed  Patient Instructions: 1)  I emphasized that nasonex  no immediate benefit in terms of improving symptoms.  To help them reached the target tissue, the patient should use Afrin two puffs every 12 hours applied one min before using the nasal steroids.  Afrin should be stopped after no more than 5 days.  If the symptoms worsen, Afrin can be restarted after 5 days off of therapy to prevent rebound congestion from overuse of Afrin.  I also emphasized that in no way are nasal steroids a concern in terms of "  addiction". 2)  Stop aspirin until bleeding and if continues you will need to see your gynecologist

## 2010-12-31 NOTE — Assessment & Plan Note (Signed)
Summary: [facility]   Visit Type:  Follow-up 1 yr   Current Medications (verified): 1)  Symbicort 160-4.5 Mcg/act Aero (Budesonide-Formoterol Fumarate) .... 2 Puffs Two Times A Day 2)  Proair Hfa 108 (90 Base) Mcg/act  Aers (Albuterol Sulfate) .Marland Kitchen.. 1-2 Sprays Every 6 Hours As Needed For Wheezing... 3)  Adult Aspirin Ec Low Strength 81 Mg  Tbec (Aspirin) .... Take 1 Tablet By Mouth Once A Day 4)  Crestor 5 Mg  Tabs (Rosuvastatin Calcium) .... Take 1 Tablet By Mouth Once A Day 5)  Hydrocodone-Acetaminophen 5-325 Mg  Tabs (Hydrocodone-Acetaminophen) .Marland Kitchen.. 1 Tab By Mouth Q4h As Needed For Pain... 6)  Multivitamins   Tabs (Multiple Vitamin) .... Take 1 Tablet By Mouth Once A Day 7)  Nasacort .... Take As Directed  Allergies: No Known Drug Allergies  Vital Signs:  Patient profile:   70 year old female Height:      65 inches Weight:      232 pounds BMI:     38.75 BP sitting:   104 / 72  (left arm)  Vitals Entered By: Burnett Kanaris, CMA (February 21, 2009 8:48 AM)

## 2010-12-31 NOTE — Assessment & Plan Note (Signed)
Summary: rov 3-4 months////kp   Chief Complaint:  1 month ROV....  History of Present Illness: 70 y/o BF here for a follow up visit... she has mult med problems as listed below...    ~  she went to the ER 05/02/08 w/ atypical CP and was placed on Norflex but she couldn't tolerate this med... she called w/ low BP  ~ 90/40 and DrCrenshaw stopped her Lisinopril/Hct... BP improved to 122/82 and she is felt better on less meds...   ~  somehow she restarted her Lisinopril and ADM 8/21-24/09 w/ resp distress- felt to be an allergic reaction to the ACE- see discharge summary- reviewed...   ~  seen 08/15/08- c/o 1 week hx of nightly throat symptoms including pain/ discomfort/ congestion... every eve ~5pm and also betw 2-3am... she uses hot tea/ lemon/ honey peppermint for relief... denies throat swelling, lip swelling, hoarseness, etc... ? reflux/ LER symptoms- not on PPI Rx- therefore added PROTONIX 40mg  before dinner & RANITIDINE 300mg  Qhs... saw ENT- DrCrossley 9/09- no VC lesions x edema of cords- he ?food allergies...   ~  September 18, 2008:  states sl improved but unable to take the Ranitidine at bedtime (too large)... we will change to Pepcid 40mg  Qhs... she wants to have the allergy testing (?food allergies) and we will set her up to see DrESL...      Current Problem List:  Hx of HYPERTENSION (ICD-401.9) - off LISINOPRIL/Hct now & taking LASIX 20mg /d... BP=142/78 today and denies HA, fatigue, visual changes, CP, palipit, dizziness, syncope, dyspnea, edema, etc...  CAD (ICD-414.00) - known non-obstructive disease... on ASA 81mg /d, STATIN (Crestor 5), & off ACE.  ~  cath 2/05 showed 20-60% obstructions in all 3 vessels... good LVF...  ~  NuclearStressTest 3/09 was negative- no ischemia or infarction, EF=68%...  VENOUS INSUFFICIENCY (ICD-459.81) - she follows a low salt diet... +LASIX 20mg /d for swelling...  HYPERCHOLESTEROLEMIA (ICD-272.0) - on CRESTOR 5mg /d-   ~ FLP 5/08 showed TChol 115, TG  83, HDL 50, LDL 49... tolerating well and taking med regularly.  ~  FLP 3/09 showed TChol 126, TG 104, HDL 56, LDL 49... rec- same.  OBESITY (ICD-278.00) - unable to diet effectively and get weight down... weight up 11# to 235#... we discussed diet and exercise program... again!  GERD (ICD-530.81) - EGD 7/05 by DrStark w/ HH- now on PPI (PROTONIX 40mg /d) & H2 blocker (PEPCID 40mg /d) at bedtime...  COLONIC POLYPS (ICD-211.3) - last colonoscopy was 12/03 showing several 3-45mm polyps (hyperplastic)...  HX OF GALLSTONE (ICD-V12.79) - seen on CT 7/06 and referred to [facility]- eval by DrCornett and being followed...  DEGENERATIVE JOINT DISEASE (ICD-715.90) - followed by DrBeane for ortho... Right knee arthroscopy 1/09 without help... s/p right TKR 06/12/08 by Tora Perches... 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 she uses ETODOLAC as needed.  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  The patient denies anorexia, fever, weight loss, weight gain, vision loss, decreased hearing, chest pain, syncope, dyspnea on exertion, peripheral edema, prolonged cough, headaches, hemoptysis, abdominal pain, melena, hematochezia, severe indigestion/heartburn, hematuria, incontinence, muscle weakness, suspicious skin lesions, transient blindness, difficulty walking, depression, unusual weight change, abnormal bleeding, enlarged lymph nodes, and angioedema.     Vital Signs:  Patient Profile:   70 Years Old Female Weight:      235.13 pounds O2 Sat:      98 % O2 treatment:    Room Air Temp:     98.3 degrees F oral Pulse rate:   57 / minute BP sitting:   142 / 78  (left arm) Cuff size:   regular  Vitals Entered By: Marijo File CMA (September 18, 2008 10:38 AM)                 Physical Exam  WD, Overweight, 70 y/o BF in NAD... GENERAL:  Alert & oriented; pleasant & cooperative... HEENT:  [place]/AT, EOM-wnl, PERRLA, EACs-clear, TMs-wnl, NOSE-clear, THROAT-clear & wnl, no lesions seen; VOICE- sl hoarse. NECK:  Supple w/ decrROM; no JVD; normal carotid impulses w/o bruits; no thyromegaly or nodules palpated; no lymphadenopathy. CHEST:  Clear to P & A; without wheezes/ rales/ or rhonchi heard... HEART:  Regular Rhythm, gr 1/6 SEM without rubs or gallops... ABDOMEN:  Obese, soft & nontender; normal bowel sounds; no organomegaly or masses detected. EXT:  right patellar bursitis w/ fluid, s/p right TKR, mod arthritic changes; no varicose veins/ +venous insuffic/ tr edema. NEURO:  CN's  intact; no focal neuro deficits... DERM:  No lesions noted; no rash etc...        Impression & Recommendations:  Problem # 1:  OTHER SYMPTOMS INVOLVING HEAD AND NECK (ICD-784.99) Still complaining of throat symptoms... discussed taking Protonix and nowmPepcid regularly, elevate HOB for anti-reflux measures, etc... Refer to DrESL for allergy testing...  Problem # 2:  HYPERTENSION (ICD-401.9) Controlled- same med... Her updated medication list for this problem includes:    Furosemide 20 Mg Tabs (Furosemide) .Marland Kitchen... Take 1 tab by mouth once daily...   Problem # 3:  CAD (ICD-414.00) Stable on Rx- contiunue same... Her updated medication list for this problem includes:    Adult Aspirin Ec Low Strength 81 Mg Tbec (Aspirin) .Marland Kitchen... Take 1 tablet by mouth once a day    Furosemide 20 Mg Tabs (Furosemide) .Marland KitchenMarland KitchenMarland KitchenMarland Kitchen  Take 1 tab by mouth once daily...   Problem # 4:  VENOUS INSUFFICIENCY (ICD-459.81) Continue Lasix and low sodium...  Problem # 5:  HYPERCHOLESTEROLEMIA (ICD-272.0) Stable- same med. Her updated medication list for this problem includes:    Crestor 5 Mg Tabs (Rosuvastatin calcium) .Marland Kitchen... Take 1 tablet by mouth once a day   Problem # 6:  OBESITY (ICD-278.00) We discussed diet + exercise...  Problem # 7:  GERD (ICD-530.81) We will try to maximize antireflux measures... Her updated medication list for this problem includes:    Pantoprazole Sodium 40 Mg Tbec (Pantoprazole sodium) .Marland Kitchen... Take 1 tab by mouth once daily- 30 min before dinner...    Pepcid 40 Mg Tabs (Famotidine) .Marland Kitchen... Take 1 tab by mouth at bedtime...   Problem # 8:  DEGENERATIVE JOINT DISEASE (ICD-715.90) She will continue f/u w/ drBeane... Her updated medication list for this problem includes:    Adult Aspirin Ec Low Strength 81 Mg Tbec (Aspirin) .Marland Kitchen... Take 1 tablet by mouth once a day    Etodolac 400 Mg Tabs (Etodolac) .Marland Kitchen... 1 tab by mouth two times a day w/ food as needed for arthritis pain...     Hydrocodone-acetaminophen 5-325 Mg Tabs (Hydrocodone-acetaminophen) .Marland Kitchen... 1 tab by mouth q4h as needed for pain...   Complete Medication List: 1)  Claritin 10 Mg Tabs (Loratadine) .... As needed 2)  Proair Hfa 108 (90 Base) Mcg/act Aers (Albuterol sulfate) .Marland Kitchen.. 1-2 sprays every 6 hours as needed for wheezing... 3)  Adult Aspirin Ec Low Strength 81 Mg Tbec (Aspirin) .... Take 1 tablet by mouth once a day 4)  Furosemide 20 Mg Tabs (Furosemide) .... Take 1 tab by mouth once daily.Marland KitchenMarland Kitchen 5)  Crestor 5 Mg Tabs (Rosuvastatin calcium) .... Take 1 tablet by mouth once a day 6)  Pantoprazole Sodium 40 Mg Tbec (Pantoprazole sodium) .... Take 1 tab by mouth once daily- 30 min before dinner... 7)  Pepcid 40 Mg Tabs (Famotidine) .... Take 1 tab by mouth at bedtime.Marland KitchenMarland Kitchen 8)  Etodolac 400 Mg Tabs (Etodolac) .Marland Kitchen.. 1 tab by mouth two times a day w/ food as needed for arthritis pain.Marland KitchenMarland Kitchen 9)  Hydrocodone-acetaminophen 5-325 Mg Tabs (Hydrocodone-acetaminophen) .Marland Kitchen.. 1 tab by mouth q4h as needed for pain... 10)  Lorazepam 0.5 Mg Tabs (Lorazepam) .... Take one tablet by mouth three times a day as needed 11)  Multivitamins Tabs (Multiple vitamin) .... Take 1 tablet by mouth once a day 12)  Magic Mouthwash  .... 1 tsp swish and swallow four times daily as needed... 13)  Promethazine-codeine 6.25-10 Mg/27ml Syrp (Promethazine-codeine) .... Take 1 teapsoon by mouth every 6 hours as needed for cough  Other Orders: Allergy Referral  (Allergy)   Patient Instructions: 1)  Today we updated your med list- see below.... 2)  We refilled your LASIX fluid pill to take every morning... 3)  We changed the Ranitidine to PEPCID to take at bedtime (easier to swallow)... 4)  We wrote a new perscription for TUSSIONEX cough syrup to use as needed... 5)  Let's get on the diet and increse the exercise w/ a goal of losing some weight... 6)  We will arrange for allergy testing for you... 7)  Call for any problems... 8)  Please schedule a  follow-up appointment in 3 months, sooner as needed.   Prescriptions: PROMETHAZINE-CODEINE 6.25-10 MG/5ML SYRP (PROMETHAZINE-CODEINE) take 1 teapsoon by mouth every 6 hours as needed for cough  #6 oz x 0   Entered by:   Marijo File CMA   Authorized  by:   Michele Mcalpine MD   Signed by:   Marijo File CMA on 09/18/2008   Method used:   Telephoned to ...       [facility] [address] [address] [phone]* (retail)       [address]       [place], [place]  [address]       Ph: [phone] or [phone]       Fax: [phone]   RxID:   [id number] TUSSIONEX PENNKINETIC ER 8-10 MG/5ML LQCR (CHLORPHENIRAMINE-HYDROCODONE) 1 tsp by mouth two times a day as needed for cough...  #4 oz x prn   Entered and Authorized by:   Michele Mcalpine MD   Signed by:   Michele Mcalpine MD on 09/18/2008   Method used:   Print then Give to Patient   RxID:   [id number] PEPCID 40 MG TABS (FAMOTIDINE) take 1 tab by mouth at bedtime...  #30 x prn   Entered and Authorized by:   Michele Mcalpine MD   Signed by:   Michele Mcalpine MD on 09/18/2008   Method used:   Print then Give to Patient   RxID:   [id number] FUROSEMIDE 20 MG  TABS (FUROSEMIDE) take 1 tab by mouth once daily...  #30 x prn   Entered and Authorized by:   Michele Mcalpine MD   Signed by:   Michele Mcalpine MD on 09/18/2008   Method used:   Print then Give to Patient   RxID:   [id number]  ]

## 2010-12-31 NOTE — Assessment & Plan Note (Signed)
Summary: leg cramps x several weeks//td   Chief Complaint:  c/o bad leg cramps x 1 week, left worse than right, and no sob.  History of Present Illness: 70 y/o BF here for a follow up visit... she has mult med problems as listed below...    ~  she went to the ER 05/02/08 w/ atypical CP and was placed on Norflex but she couldn't tolerate this med... she called w/ low BP  ~ 90/40 and DrCrenshaw stopped her Lisinopril/Hct... BP improved to 122/82 and she is felt better on less meds...   ~  somehow she restarted her Lisinopril and ADM 8/21-24/09 w/ resp distress- felt to be an allergic reaction to the ACE- see discharge summary- reviewed...   ~  seen 08/15/08- c/o 1 week hx of nightly throat symptoms including pain/ discomfort/ congestion... every eve ~5pm and also betw 2-3am... she uses hot tea/ lemon/ honey peppermint for relief... denies throat swelling, lip swelling, hoarseness, etc... ? reflux/ LER symptoms- not on PPI Rx- therefore added PROTONIX 40mg  before dinner & RANITIDINE 300mg  Qhs... saw ENT- DrCrossley 9/09- no VC lesions x edema of cords- he ?food allergies...   ~  September 18, 2008:  states sl improved but unable to take the Ranitidine at bedtime (too large)... we will change to Pepcid 40mg  Qhs... she wants to have the allergy testing (?food allergies) and we will set her up to see DrESL...   November 02, 2008--presents for nocturnal leg cramps over last 5 days, c/o cramps along both calf and someting into thighs at times at night. uses ice without much relief. Is not using diuretic or potassium for several weeks. Denies chest pain, dyspnea, orthopnea, hemoptysis, fever, n/v/d, edema, recent travel or antibiotics.     Current Problem List:       Prior Medications Reviewed Using: Patient Recall  Updated Prior Medication List: CLARITIN 10 MG TABS (LORATADINE) Take 1 tablet by mouth once a day PROAIR HFA 108 (90 BASE) MCG/ACT  AERS (ALBUTEROL SULFATE) 1-2 sprays every 6 hours as  needed for wheezing... ADULT ASPIRIN EC LOW STRENGTH 81 MG  TBEC (ASPIRIN) Take 1 tablet by mouth once a day CRESTOR 5 MG  TABS (ROSUVASTATIN CALCIUM) Take 1 tablet by mouth once a day PANTOPRAZOLE SODIUM 40 MG TBEC (PANTOPRAZOLE SODIUM) take 1 tab by mouth once daily- 30 min before dinner... PEPCID 40 MG TABS (FAMOTIDINE) take 1 tab by mouth at bedtime... ETODOLAC 400 MG  TABS (ETODOLAC) 1 tab by mouth two times a day w/ food as needed for arthritis pain... HYDROCODONE-ACETAMINOPHEN 5-325 MG  TABS (HYDROCODONE-ACETAMINOPHEN) 1 tab by mouth Q4H as needed for pain... LORAZEPAM 0.5 MG TABS (LORAZEPAM) take one tablet by mouth three times a day as needed MULTIVITAMINS   TABS (MULTIPLE VITAMIN) Take 1 tablet by mouth once a day * MAGIC MOUTHWASH 1 tsp swish and swallow four times daily as needed... PROMETHAZINE-CODEINE 6.25-10 MG/5ML SYRP (PROMETHAZINE-CODEINE) take 1 teapsoon by mouth every 6 hours as needed for cough SYMBICORT 160-4.5 MCG/ACT AERO (BUDESONIDE-FORMOTEROL FUMARATE) 2 puffs two times a day  Current Allergies (reviewed today): No known allergies   Past Medical History:    Reviewed history from 09/18/2008 and no changes required:       Hx of HYPERTENSION (ICD-401.9) - off LISINOPRIL/Hct now & taking LASIX 20mg /d as needed               CAD (ICD-414.00) - known non-obstructive disease... on ASA 81mg /d, STATIN (Crestor 5), & off ACE.        ~  cath 2/05 showed 20-60% obstructions in all 3 vessels... good LVF...        ~  NuclearStressTest 3/09 was negative- no ischemia or infarction, EF=68%...              VENOUS INSUFFICIENCY (ICD-459.81) - she follows a low salt diet... +LASIX 20mg /d for swelling...              HYPERCHOLESTEROLEMIA (ICD-272.0) - on CRESTOR 5mg /d-         ~ FLP 5/08 showed TChol 115, TG 83, HDL 50, LDL 49... tolerating well and taking med regularly.        ~  FLP 3/09 showed TChol 126, TG 104, HDL 56, LDL 49... rec- same.              OBESITY (ICD-278.00) -  unable to diet effectively and get weight down... weight up 11# to 235#... we discussed diet and exercise program... again!              GERD (ICD-530.81) - EGD 7/05 by DrStark w/ HH- now on PPI (PROTONIX 40mg /d) & H2 blocker (PEPCID 40mg /d) at bedtime...              COLONIC POLYPS (ICD-211.3) - last colonoscopy was 12/03 showing several 3-22mm polyps (hyperplastic)...              HX OF GALLSTONE (ICD-V12.79) - seen on CT 7/06 and referred to [facility]- eval by DrCornett and being followed...              DEGENERATIVE JOINT DISEASE (ICD-715.90) - followed by DrBeane for ortho... Right knee arthroscopy 1/09 without help... s/p right TKR 06/12/08 by Tora Perches... 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 she uses ETODOLAC as needed.              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                                        Family History:    Reviewed history from 05/17/2008 and no changes required:       mother died age 12 from heart problems and stroke       falther alive age 50  Social History:    Reviewed history from 05/17/2008 and no changes required:       retired       1-3 cigs every now and then       widowed       2 children   Risk Factors:  Tobacco use:  current    Cigarettes:  Yes -- 3 cigs pack(s) per day   Review of Systems      See HPI   Vital Signs:  Patient Profile:   70 Years Old Female Weight:      225.50 pounds O2 Sat:      98 % O2 treatment:    Room Air Temp:     98.1 degrees F oral Pulse rate:   59 / minute BP sitting:  128 / 80  (left arm) Cuff size:   regular  Vitals Entered By: Elray Buba RN (November 02, 2008 10:49 AM)              Is Patient Diabetic? No Comments Medications reviewed with patient  Elray Buba RN  November 02, 2008 10:49 AM      Physical Exam  WD, Overweight, 70 y/o BF in NAD... GENERAL:  Alert & oriented; pleasant & cooperative... HEENT:  [place]/AT, EOM-wnl, PERRLA, EACs-clear, TMs-wnl, NOSE-clear, THROAT-clear & wnl, no lesions seen; VOICE- sl hoarse. NECK:  Supple w/ decrROM; no JVD; normal carotid impulses w/o bruits; no thyromegaly or nodules palpated; no lymphadenopathy. CHEST:  Clear to P & A; without wheezes/ rales/ or rhonchi heard... HEART:  Regular Rhythm, gr 1/6 SEM without rubs or gallops... ABDOMEN:  Obese, soft & nontender; normal bowel sounds; no organomegaly or masses detected. EXT:  right patellar bursitis w/ fluid, s/p right TKR, mod arthritic changes; scattered  varicose veins/ +venous insuffic/ no  edema., neg homans sign NEURO:  CN's intact; no focal neuro deficits... DERM:  No lesions noted; no rash etc...        Impression & Recommendations:  Problem # 1:  LEG CRAMPS, NOCTURNAL (ICD-729.82) Questionable etiology except for venous insufficiency and varicose veins.  will check labs-looking at K+ WGN:FAOZ heat to legs as needed  light stretches once daily  will will call with labs.  Please contact office for sooner follow up if symptoms do not improve or worsen   Orders: Est. Patient Level III (30865)   Medications Added to Medication List This Visit: 1)  Claritin 10 Mg Tabs (Loratadine) .... Take 1 tablet by mouth once a day 2)  Symbicort 160-4.5 Mcg/act Aero (Budesonide-formoterol fumarate) .... 2 puffs two times a day 3)  Lasix 20 Mg Tabs (Furosemide) .Marland Kitchen.. 1 by mouth once daily as needed  Complete Medication List: 1)  Claritin 10 Mg Tabs (Loratadine) .... Take 1 tablet by mouth once a day 2)  Proair Hfa 108 (90 Base) Mcg/act Aers (Albuterol sulfate) .Marland Kitchen.. 1-2 sprays every 6 hours as needed for wheezing... 3)  Adult Aspirin Ec Low Strength 81 Mg Tbec  (Aspirin) .... Take 1 tablet by mouth once a day 4)  Crestor 5 Mg Tabs (Rosuvastatin calcium) .... Take 1 tablet by mouth once a day 5)  Pantoprazole Sodium 40 Mg Tbec (Pantoprazole sodium) .... Take 1 tab by mouth once daily- 30 min before dinner... 6)  Pepcid 40 Mg Tabs (Famotidine) .... Take 1 tab by mouth at bedtime.Marland KitchenMarland Kitchen 7)  Etodolac 400 Mg Tabs (Etodolac) .Marland Kitchen.. 1 tab by mouth two times a day w/ food as needed for arthritis pain.Marland KitchenMarland Kitchen 8)  Hydrocodone-acetaminophen 5-325 Mg Tabs (Hydrocodone-acetaminophen) .Marland Kitchen.. 1 tab by mouth q4h as needed for pain.Marland KitchenMarland Kitchen 9)  Lorazepam 0.5 Mg Tabs (Lorazepam) .... Take one tablet by mouth three times a day as needed 10)  Multivitamins Tabs (Multiple vitamin) .... Take 1 tablet by mouth once a day 11)  Magic Mouthwash  .... 1 tsp swish and swallow four times daily as needed... 12)  Promethazine-codeine 6.25-10 Mg/20ml Syrp (Promethazine-codeine) .... Take 1 teapsoon by mouth every 6 hours as needed for cough 13)  Symbicort 160-4.5 Mcg/act Aero (Budesonide-formoterol fumarate) .... 2 puffs two times a day 14)  Lasix 20 Mg Tabs (Furosemide) .Marland Kitchen.. 1 by mouth once daily as needed  Other Orders: TLB-BMP (Basic Metabolic Panel-BMET) (80048-METABOL)   Patient Instructions: 1)  Warm heat to legs as needed  2)  light stretches once daily  3)  will will call with labs.  4)  Please contact office for sooner follow up if symptoms do not improve or worsen    ]

## 2010-12-31 NOTE — Progress Notes (Signed)
Summary: note-surgery clearance  Phone Note Call from Patient   Caller: Patient Call For: Odean Fester Summary of Call: pt dropped off note to be signed and faqxed ASAP (per pt) for knee surgery. fax to: [facility] [phone]. i have "hand delivered" this to [facility].  Initial call taken by: Tivis Ringer,  March 27, 2008 9:38 AM  Follow-up for Phone Call        spoke with pt this am...per SN  she will have to come in for ov----we have scheduled this for her on friday  may 1st at 3pm.  pt is aware of this Follow-up by: Marijo File CMA,  March 28, 2008 11:50 AM

## 2010-12-31 NOTE — Progress Notes (Signed)
Summary: refilled skelaxin and vicodin  Phone Note Call from Patient Call back at Home Phone [phone] Call back at [phone]   Caller: Patient Call For: Ananda Sitzer p Reason for Call: Talk to Nurse Summary of Call: pt's arm better but still sore and can't raise it up.  Want to know if she can get a refill on the pain and inflamation med. [facility] [address] Initial call taken by: Eugene Gavia,  March 14, 2010 11:26 AM  Follow-up for Phone Call        PT reports left arm is feeling some better but still sore and can't raise it completely like she should be.  Pt doesn't think it is bad enough to see a Ortho Dr but is requesting one more week refill on Skelaxin and Vicodin.  Please advise. Abigail Miyamoto RN  March 14, 2010 11:35 AM   Additional Follow-up for Phone Call Additional follow up Details #1::        that is fiine for refill x 1 w/ no refills if not improving would see ortho call back if not totally resolved.  Additional Follow-up by: Rubye Oaks NP,  March 14, 2010 2:11 PM    Additional Follow-up for Phone Call Additional follow up Details #2::    Rxs were refilled x 1 only- LMTCB Vernie Murders  March 14, 2010 2:18 PM  pt advised . Carron Curie CMA  March 14, 2010 3:13 PM   Prescriptions: SKELAXIN 800 MG TABS (METAXALONE) 1 by mouth three times a day as needed muscle spasm  #30 x 0   Entered by:   Vernie Murders   Authorized by:   Rubye Oaks NP   Signed by:   Vernie Murders on 03/14/2010   Method used:   Telephoned to ...       [facility] [address] [phone]* (retail)       [address]       [place], [place]  [address]       Ph: [phone] or [phone]       Fax: [phone]   RxID:   [id number] HYDROCODONE-ACETAMINOPHEN 5-325 MG  TABS (HYDROCODONE-ACETAMINOPHEN) 1 tab by mouth Q4H as needed for pain...  #100 x 0   Entered by:   Vernie Murders   Authorized by:   Rubye Oaks NP   Signed by:   Vernie Murders on 03/14/2010  Method used:   Telephoned to ...       [facility] [address] [phone]* (retail)       [address]       [place], [place]  [address]       Ph: [phone] or [phone]       Fax: [phone]   RxID:   [id number]

## 2010-12-31 NOTE — Letter (Signed)
Summary: [facility]  [facility]   Imported By: Lester [place] 05/28/2009 08:00:19  _____________________________________________________________________  External Attachment:    Type:   Image     Comment:   External Document

## 2010-12-31 NOTE — Progress Notes (Signed)
Summary: cough///Tussinex RX called to pharmacy  Phone Note Call from Patient Call back at [phone]   Caller: Patient Call For: nadel Summary of Call: Pt c/o cough x1 wk wants something called in, pls advise.//[facility] Initial call taken by: Darletta Moll,  October 28, 2010 8:41 AM  Follow-up for Phone Call        Called, spoke with pt.  She c/o tickle in throat and prod cough with clear mucus x 1 wks.  Denies increased SOB, wheezing, chest tightness, sneezing, f/c/s.  Requesting cough med.   [facility] [address] NKDA Dr. Kriste Basque, pls advise.  Thanks! Follow-up by: Gweneth Dimitri RN,  October 28, 2010 9:45 AM  Additional Follow-up for Phone Call Additional follow up Details #1::        per SN---tussionex is the best---#4oz   1 tsp by mouth two times a day as needed for cough with no refills. thanks Randell Loop CMA  October 28, 2010 11:35 AM   Pt aware of RX for Tussinex being called to [facility] on [address]. Additional Follow-up by: Michel Bickers CMA,  October 28, 2010 11:53 AM    New/Updated Medications: Sandria Senter ER 10-8 MG/5ML LQCR (HYDROCOD POLST-CHLORPHEN POLST) 1 tsp every 12 hours as needed for cough Prescriptions: TUSSIONEX PENNKINETIC ER 10-8 MG/5ML LQCR (HYDROCOD POLST-CHLORPHEN POLST) 1 tsp every 12 hours as needed for cough  #4 x 0   Entered by:   Michel Bickers CMA   Authorized by:   Michele Mcalpine MD   Signed by:   Michel Bickers CMA on 10/28/2010   Method used:   Telephoned to ...       [facility] [address] [phone]* (retail)       [address]       [place], [place]  [address]       Ph: [phone]       Fax: [phone]   RxID:   [id number]

## 2010-12-31 NOTE — Assessment & Plan Note (Signed)
Summary: 5 MONTHS//MBW   Primary Care Provider:  Alayna Mabe  CC:  4 month ROV & review of mult medical problems....  History of Present Illness: 70 y/o BF here for a follow up of her mult med problems including: Allergies, HBP, CAD, Chol, Obesity, etc...   ~  ER 05/02/08 w/ atypical CP- Rx Norflex but couldn't tolerate this med... she called w/ low BP  ~ 90/40 and DrCrenshaw stopped her Lisinopril/Hct... BP improved to 122/82 and she is felt better on less meds...  ~  somehow she restarted her Lisinopril and ADM 8/21-24/09 w/ resp distress- felt to be an allergic reaction to the ACE- see discharge summary- reviewed...  ~  Sep09: seen w/ ? reflux/ LER symptoms- added PROTONIX 40mg  before dinner & RANITIDINE 300mg  Qhs... saw ENT- DrCrossley 9/09- no VC lesions x edema of cords.  ~  Oct09:  states sl improved but unable to take the Ranitidine at bedtime (too large)... we will change to Pepcid 40mg  Qhs... she wants to have allergy testing (?food allergies) and we sent her to DrESL for testing- skin testing +dust & given Medrol, Symbicort160, Nasonex, Astepro, Zyrtek, & allergy shots recommended (they apparently did not test for food allergies)...   ~  Jan10:  feeling better on the allergy meds from DrESL... she is getting allergy shots once per week now and states "it helps"... BP under control 120/82 today- and she decreased the Lasix20 to Prn because "I pee too much".   ~  Apr 18, 2009:  Ran out of her meds 2 weeks ago, needs refill perscriptions, she says... weight is up to 238#, which is a BMI= 40 for her 5\' 5"  frame... still smoking 1-2 cig/d... we discussed the importance of diet/ exercise/ weight reduction/ discontinuing all smoking/ and taking your medicines every day!!!    Current Problem List:  ALLERGIC RHINITIS (ICD-477.9) - on NASONEX, ASTEPRO, ZYRTEK, & allergy shots... CHRONIC OBSTRUCTIVE ASTHMA UNSPECIFIED (ICD-493.20) - she had +allergy skin testing for dust mites and started on  allergy shots per DrESL along w/ SYMBICORT 160- 2spBid, but she doesn't take anything regularly... +smoker but decreased from 1/2 ppd x 70yrs to 1-2 cig/d.  ~  5/10:  "I have an appt w/ my asthma doctor soon"  Hx of HYPERTENSION (ICD-401.9) - off LISINOPRIL/Hct (ALLERGIC to ACE meds) & taking LASIX 20mg /d... BP=136/84 today and denies HA, fatigue, visual changes, CP, palipit, dizziness, syncope, dyspnea, edema, etc...  CAD (ICD-414.00) - known non-obstructive disease & atypic CP... on ASA 81mg /d, STATIN (Crestor 5), & off ACE.  ~  cath 2/05 showed 20-60% obstructions in all 3 vessels... good LVF...  ~  NuclearStressTest 3/09 was negative- no ischemia or infarction, EF=68%...  ~  last ROV w/ DrCrenshaw 3/10 reviewed- contin ASA/ Statin/ smoking cessation/ monitor BP & Chol.  VENOUS INSUFFICIENCY (ICD-459.81) - she follows a low salt diet... +LASIX 20mg /d for swelling...  ~  1/10: we discussed no salt, Lasix20, elevation, support hose.  HYPERCHOLESTEROLEMIA (ICD-272.0) - on CRESTOR 5mg /d-   ~  FLP 5/08 showed TChol 115, TG 83, HDL 50, LDL 49... tolerating well and taking med regularly.  ~  FLP 3/09 showed TChol 126, TG 104, HDL 56, LDL 49... rec- same.  ~  FLP 3/10 showed TChol 152, TG 97, HDL 77, LDL 55  OBESITY (ICD-278.00) - unable to diet effectively and get weight down... weight today = 238#, 5\' 5"  tall,  BMI= 40... we discussed diet and exercise program... again!  GERD (ICD-530.81) - EGD  7/05 by DrStark w/ HH- now on PPI (PROTONIX 40mg /d) & H2 blocker (PEPCID 40mg /d) at bedtime...  ~  1/10: she stopped above meds & is recommended to restart Rx.  COLONIC POLYPS (ICD-211.3) - last colonoscopy was 12/03 showing several 3-71mm polyps (hyperplastic)...  HX OF GALLSTONE (ICD-V12.79) - seen on CT 7/06 and referred to [facility]- eval by DrCornett and being followed...  DEGENERATIVE JOINT DISEASE (ICD-715.90) - followed by DrBeane for ortho... Right knee arthroscopy 1/09 without help... s/p right TKR  06/12/08 by Tora Perches... 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 she is off the Etodolac & takes VICODIN Prn...  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   Allergies (verified): No Known Drug Allergies  Comments:  Nurse/Medical Assistant: The patient's medications and allergies were reviewed with the patient and were updated in the Medication and Allergy Lists.  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)    LEG CRAMPS, NOCTURNAL (ICD-729.82)  Past Surgical History:    S/P Hysterectomy    S/P CSpine surg in 1993 for Arnold-Chiari Malformation    S/P R knee arthroscopy 1/09 by DrBeane    S/P right TKR by DrBeane 7/09  Family History:    Reviewed history from 05/17/2008 and no changes required:       mother died age 58 from heart problems and stroke       falther alive age 31  Social History:    Reviewed history from 05/17/2008 and no changes required:       retired       1-3 cigs every now and then       widowed       2 children  Review of Systems      See HPI       The patient complains of chest pain and dyspnea on exertion.  The patient denies anorexia, fever, weight  loss, weight gain, vision loss, decreased hearing, hoarseness, syncope, peripheral edema, prolonged cough, headaches, hemoptysis, abdominal pain, melena, hematochezia, severe indigestion/heartburn, hematuria, incontinence, muscle weakness, suspicious skin lesions, transient blindness, difficulty walking, depression, unusual weight change, abnormal bleeding, enlarged lymph nodes, and angioedema.    Vital Signs:  Patient profile:   70 year old female Height:      65 inches Weight:      238 pounds BMI:     39.75 O2 Sat:      98 % Temp:     97.2 degrees F oral Pulse rate:   67 / minute BP sitting:   136 / 84  (right arm) Cuff size:   regular  Vitals Entered By: Marijo File CMA (Apr 18, 2009 9:09 AM)  O2 Sat at Rest %:  98 O2 Flow:  room air  CC: 4 month ROV & review of mult medical problems... Comments no changes in meds per pt   Physical Exam  Additional Exam:  WD, Overweight, 70 y/o BF in NAD... GENERAL:  Alert & oriented; pleasant & cooperative... HEENT:  [place]/AT, EOM-wnl, PERRLA, EACs-clear, TMs-wnl, NOSE-clear, THROAT-clear & wnl, no lesions seen; VOICE- sl hoarse. NECK:  Supple w/ decrROM; no JVD; normal carotid impulses w/o bruits; no thyromegaly or nodules palpated; no lymphadenopathy. CHEST:  Clear to P & A; without wheezes/ rales/ or rhonchi heard... HEART:  Regular Rhythm, gr 1/6 SEM without rubs or gallops... ABDOMEN:  Obese, soft & nontender; normal bowel sounds; no organomegaly or masses detected. EXT:  s/p right TKR, mod arthritic changes; scattered  varicose veins/ +venous insuffic/ no  edema... NEURO:  CN's intact; no focal neuro deficits... DERM:  No lesions noted; no rash etc...     Impression & Recommendations:  Problem # 1:  Hx of NON_COMPLIANCE w/ MED Rx--- Pt reminded to take meds regularly...  Problem # 2:  CHRONIC OBSTRUCTIVE ASTHMA UNSPECIFIED (ICD-493.20) She will continue the Symbicort, and use the Proair Prn... we discussed controller meds, vs  rescue meds... sheknows that she must quit smoking completely... Her updated medication list for this problem includes:    Symbicort 160-4.5 Mcg/act Aero (Budesonide-formoterol fumarate) .Marland Kitchen... 2 puffs two times a day    Proair Hfa 108 (90 Base) Mcg/act Aers (Albuterol sulfate) .Marland Kitchen... 1-2 sprays every 6 hours as needed for wheezing...  Problem # 3:  HYPERTENSION (ICD-401.9) Controlled on the Lasix... she must get on deit and lose weight... Her updated medication list for this problem includes:    Furosemide 20 Mg Tabs (Furosemide) .Marland Kitchen... Take 1 tab by mouth once daily...  Problem # 4:  CAD (ICD-414.00) Followed by Aletta Edouard-  not having angina pains, etc... Her updated medication list for this problem includes:    Adult Aspirin Ec Low Strength 81 Mg Tbec (Aspirin) .Marland Kitchen... Take 1 tablet by mouth once a day    Furosemide 20 Mg Tabs (Furosemide) .Marland Kitchen... Take 1 tab by mouth once daily...  Problem # 5:  HYPERCHOLESTEROLEMIA (ICD-272.0) She understands that the cureent meds would work alot better if she'd lose weight... Her updated medication list for this problem includes:    Crestor 5 Mg Tabs (Rosuvastatin calcium) .Marland Kitchen... Take 1 tablet by mouth once a day  Problem # 6:  OBESITY (ICD-278.00) Diet + exercise are the keys...  Problem # 7:  GERD (ICD-530.81) She should maintain the antireflux regimen (elevate HOB, etc) due to her LER... I have recommended restarting the PPI and H2Blocker therapy w/ PRILOSEC 20mg /d in AM  and RANITADINE vs PEPCID Qhs...  Problem # 8:  DEGENERATIVE JOINT DISEASE (ICD-715.90) Followed by Tora Perches for ortho... Her updated medication list for this problem includes:    Adult Aspirin Ec Low Strength 81 Mg Tbec (Aspirin) .Marland Kitchen... Take 1 tablet by mouth once a day    Hydrocodone-acetaminophen 5-325 Mg Tabs (Hydrocodone-acetaminophen) .Marland Kitchen... 1 tab by mouth q4h as needed for pain...  Complete Medication List: 1)  Nasonex 50 Mcg/act Susp (Mometasone furoate) .... 2 sprays in each  nostril two times a day. 2)  Symbicort 160-4.5 Mcg/act Aero (Budesonide-formoterol fumarate) .... 2 puffs two times a day 3)  Proair Hfa 108 (90 Base) Mcg/act Aers (Albuterol sulfate) .Marland Kitchen.. 1-2 sprays every 6 hours as needed for wheezing... 4)  Adult Aspirin Ec Low Strength 81 Mg Tbec (Aspirin) .... Take 1 tablet by mouth once a day 5)  Furosemide 20  Mg Tabs (Furosemide) .... Take 1 tab by mouth once daily.Marland KitchenMarland Kitchen 6)  Potassium Chloride Crys Cr 20 Meq Cr-tabs (Potassium chloride crys cr) .... Take 1 tab by mouth once daily.Marland KitchenMarland Kitchen 7)  Crestor 5 Mg Tabs (Rosuvastatin calcium) .... Take 1 tablet by mouth once a day 8)  Hydrocodone-acetaminophen 5-325 Mg Tabs (Hydrocodone-acetaminophen) .Marland Kitchen.. 1 tab by mouth q4h as needed for pain.Marland KitchenMarland Kitchen 9)  Multivitamins Tabs (Multiple vitamin) .... Take 1 tablet by mouth once a day  Other Orders: Prescription Created Electronically [id number])  Patient Instructions: 1)  Today we updated your med list- see below.... 2)  We refilled your meds per request... 3)  Abi, you need to quit the last of those nasty cigarettes!!! 4)  You also NEED to get on a diet & increase your exercise--- the goal is to lose weight!!! 5)  Call for any problems.Marland KitchenMarland Kitchen 6)  Let's plan a follow up visit in 3-4 months w/ FASTING blood work at that time. Prescriptions: CRESTOR 5 MG  TABS (ROSUVASTATIN CALCIUM) Take 1 tablet by mouth once a day  #30 x prn   Entered and Authorized by:   Michele Mcalpine MD   Signed by:   Michele Mcalpine MD on 04/18/2009   Method used:   Print then Give to Patient   RxID:   [id number] POTASSIUM CHLORIDE CRYS CR 20 MEQ CR-TABS (POTASSIUM CHLORIDE CRYS CR) take 1 tab by mouth once daily...  #30 x prn   Entered and Authorized by:   Michele Mcalpine MD   Signed by:   Michele Mcalpine MD on 04/18/2009   Method used:   Print then Give to Patient   RxID:   [id number] FUROSEMIDE 20 MG TABS (FUROSEMIDE) take 1 tab by mouth once daily...  #30 x prn   Entered and Authorized by:   Michele Mcalpine MD   Signed by:   Michele Mcalpine MD on 04/18/2009   Method used:   Print then Give to Patient   RxID:   [id number]

## 2010-12-31 NOTE — Progress Notes (Signed)
Summary: needs f/u w/ sn  Phone Note Call from Patient Call back at Home Phone [phone]   Caller: Patient Call For: nadel Summary of Call: pt was just seen. needs a f/u w/ sn in 4-6 wks. (his first avail is in feb/ 2011.  Initial call taken by: Tivis Ringer,  November 01, 2009 12:02 PM  Follow-up for Phone Call        Please advise of appt date.Michel Bickers CMA  November 01, 2009 12:19 PM    1-10 at 9:30 am for follow up.  thanks Marijo File CMA  November 01, 2009 12:27 PM   Additional Follow-up for Phone Call Additional follow up Details #1::        Appt scheduled.  Pt's husband informed of appt date and time. Abigail Miyamoto RN  November 01, 2009 12:34 PM

## 2010-12-31 NOTE — Assessment & Plan Note (Signed)
Summary: per check out/sf      Allergies Added: NKDA  Visit Type:  Follow-up Primary Provider:  scott nadel   History of Present Illness: [name] is a pleasant female who has a history of coronary disease. She had a cardiac catheterization on January 18, 2004.  At that time she was found to have mild irregularities in the left main.  There was a 40% stenosis in the proximal LAD and a distal 30% lesion.  The first diagonal had a 30-40% proximal lesion.  The ostium of the LAD by intravascular ultrasound had a luminal area of 3.9 mm squared and percent stenosis 60%.  She had nonobstructive disease in the left circumflex and right coronary.  Her LV function was normal.  Her last Myoview was in Dec 2010 and showed no significant abnormality and an ejection fraction was 64%. An echocardiogram was also performed in December of 2010 and revealed normal LV function and mild left atrial enlargement. I last saw her in December of 2010. Since then the patient denies any dyspnea on exertion, orthopnea, PND, pedal edema, palpitations, syncope or chest pain.   Current Medications (verified): 1)  Nasonex 50 Mcg/act Susp (Mometasone Furoate) .... 2 Sprays in Each Nostril Two Times A Day. 2)  Astepro 0.15 % Soln (Azelastine Hcl) .... 2 Sprays Each Nostril Once Daily As Needed 3)  Symbicort 160-4.5 Mcg/act Aero (Budesonide-Formoterol Fumarate) .... 2 Puffs Two Times A Day 4)  Adult Aspirin Ec Low Strength 81 Mg  Tbec (Aspirin) .... Take 1 Tablet By Mouth Once A Day 5)  Furosemide 20 Mg Tabs (Furosemide) .... Take 1 Tab By Mouth Once Daily.Marland KitchenMarland Kitchen 6)  Potassium Chloride Crys Cr 20 Meq Cr-Tabs (Potassium Chloride Crys Cr) .... Take 1 Tab By Mouth Once Daily.Marland KitchenMarland Kitchen 7)  Pravastatin Sodium 40 Mg Tabs (Pravastatin Sodium) .... Take 1 Tab By Mouth At Bedtime For Cholesterol... 8)  Hydrocodone-Acetaminophen 5-325 Mg  Tabs (Hydrocodone-Acetaminophen) .Marland Kitchen.. 1 Tab By Mouth Every 6-8 H As Needed For Pain... 9)  Skelaxin 800 Mg  Tabs (Metaxalone) .Marland Kitchen.. 1 By Mouth Three Times A Day As Needed Muscle Spasm 10)  Multivitamins   Tabs (Multiple Vitamin) .... Take 1 Tablet By Mouth Once A Day 11)  Isoniazid 300 Mg Tabs (Isoniazid) .Marland Kitchen.. 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 S/P Hysterectomy S/P CSpine surg in 1993 for Arnold-Chiari Malformation S/P R knee arthroscopy 1/09 by DrBeane S/P right TKR by DrBeane 7/09  Social History: Reviewed history from 06/10/2010 and no changes required. retired 1 pack per week widowed 2 children  Review of Systems       Some arthralgias but no fevers or chills, productive cough, hemoptysis, dysphasia, odynophagia, melena, hematochezia, dysuria, hematuria, rash, seizure activity, orthopnea, PND, pedal edema, claudication. Remaining systems are negative.   Vital Signs:  Patient profile:   70 year old female Height:      65 inches Weight:      228 pounds Pulse rate:   47 / minute BP sitting:   110 / 84  (left arm)  Vitals Entered By: Laurance Flatten CMA (June 25, 2010 10:10 AM)  Physical Exam  General:  Well-developed obese in no acute distress.  Skin is warm and dry.  HEENT is normal.  Neck is supple. No thyromegaly.  Chest is clear to auscultation with normal expansion.  Cardiovascular exam is regular rate and rhythm. 1/6 SEM Abdominal exam nontender or  distended. No masses palpated. Extremities show trace edema. neuro grossly intact    EKG  Procedure date:  06/25/2010  Findings:      Marked sinus bradycardia at a rate of 47. No significant ST changes.  Impression & Recommendations:  Problem # 1:  BRADYCARDIA (ICD-427.89) No symptoms. No further therapy indicated. Her updated medication list for this problem includes:    Adult Aspirin Ec Low Strength 81 Mg Tbec (Aspirin) .Marland Kitchen... Take 1 tablet by mouth once a day  Orders: EKG w/ Interpretation (93000)  Problem # 2:  CIGARETTE SMOKER (ICD-305.1) Patient counseled on discontinuing.  Problem # 3:  HYPERTENSION (ICD-401.9) Blood pressure controlled at present. Renal function and potassium monitored by primary care. Her updated medication list for this problem includes:    Adult Aspirin Ec Low Strength 81 Mg Tbec (Aspirin) .Marland Kitchen... Take 1 tablet by mouth once a day    Furosemide 20 Mg Tabs (Furosemide) .Marland Kitchen... Take 1 tab by mouth once daily...  Problem # 4:  CAD (ICD-414.00) Continue aspirin and statin. Her updated medication list for this problem includes:    Adult Aspirin Ec Low Strength 81 Mg Tbec (Aspirin) .Marland Kitchen... Take 1 tablet by mouth once a day  Problem # 5:  HYPERCHOLESTEROLEMIA (ICD-272.0) Continue statin. Lipids and liver monitored by primary care. Her updated medication list for this problem includes:    Pravastatin Sodium 40 Mg Tabs (Pravastatin sodium) .Marland Kitchen... Take 1 tab by mouth at bedtime for cholesterol...  Problem # 6:  CHRONIC OBSTRUCTIVE ASTHMA UNSPECIFIED (ICD-493.20)  The following medications were removed from the medication list:    Proair Hfa 108 (90 Base) Mcg/act Aers (Albuterol sulfate) .Marland Kitchen... 1-2 sprays every 6 hours as needed for wheezing... Her updated medication list for this problem includes:    Symbicort 160-4.5 Mcg/act Aero (Budesonide-formoterol fumarate) .Marland Kitchen... 2 puffs two times a day  Patient Instructions: 1)  Your physician recommends that you  schedule a follow-up appointment in: 1 yr with Dr Antoine Poche 2)  Your physician recommends that you continue on your current medications as directed. Please refer to the Current Medication list given to you today.

## 2010-12-31 NOTE — Consult Note (Signed)
Summary: asthma attack/[facility]  asthma attack/[facility]   Imported By: Lester Loraine 09/18/2008 09:56:14  _____________________________________________________________________  External Attachment:    Type:   Image     Comment:   External Document

## 2010-12-31 NOTE — Progress Notes (Signed)
Summary: rx  Phone Note Call from Patient Call back at Home Phone [phone] Call back at [phone]   Caller: Patient Call For: nadel Reason for Call: Talk to Nurse Summary of Call: chest pain, middle of chest beside breast.  Can she get something for the pain? [facility] Initial call taken by: Eugene Gavia,  November 01, 2009 8:31 AM  Follow-up for Phone Call        Pt c/o pain in middle of chest during night last night.  Pt thinks it may be her hiatal hernia.  Pt given appt today at 11:00 with Tammy Parrett. Follow-up by: Abigail Miyamoto RN,  November 01, 2009 9:26 AM

## 2010-12-31 NOTE — Assessment & Plan Note (Signed)
Summary: HOSPITAL FOLLOW UP/ MBW   Vital Signs:  Patient Profile:   70 Years Old Female Weight:      228.25 pounds O2 Sat:      96 % O2 treatment:    Room Air Temp:     98.3 degrees F oral Pulse rate:   95 / minute BP sitting:   130 / 70  (left arm)  Vitals Entered By: Marijo File CMA (August 15, 2008 11:58 AM)                 Chief Complaint:  Post-hosp ROV....  History of Present Illness: 70 y/o BF here for a post hospital follow up visit... she has mult med problems as listed below...    ~  she went to the ER 05/02/08 w/ atypical CP and was placed on Norflex but she couldn't tolerate this med... she called w/ low BP  ~ 90/40 and DrCrenshaw stopped her Lisinopril/Hct... BP improved to 122/82 and she is felt better on less meds...   ~  somehow restarted her Lisinopril and ADM 8/21-24/09 w/ resp distress- felt to be an allergic reaction to the ACE **  see below  ** August 15, 2008- c/o 1 week hx of nightly throat symptoms including pain/ discomfort/ congestion... every eve ~5pm and also betw 2-3am... she uses hot tea/ lemon/ honey peppermint for relief... denies throat swelling, lip swelling, hoarseness, etc... ? reflux/ LER symptoms- not currently on PPI...   Current Problem List:  Hx of HYPERTENSION (ICD-401.9) - off LISINOPRIL/Hct now... BP=130/70 and feeling better- denies HA, fatigue, visual changes, CP, palipit, dizziness, syncope, dyspnea, edema, etc...  CAD (ICD-414.00) - known non-obstructive disease... on ASA 81mg /d, STATIN (Crestor 5), & off ACE.  ~  cath 2/05 showed 20-60% obstructions in all 3 vessels... good LVF...  ~  NuclearStressTest 3/09 was negative- no ischemia or infarction, EF=68%...  VENOUS INSUFFICIENCY (ICD-459.81) - she follows a low salt diet... add LASIX 20mg  as needed for swelling...  HYPERCHOLESTEROLEMIA (ICD-272.0) - on CRESTOR 5mg /d-   ~ FLP 5/08 showed TChol 115, TG 83, HDL 50, LDL 49... tolerating well and taking med regularly.  ~   FLP 3/09 showed TChol 126, TG 104, HDL 56, LDL 49... rec- same.  OBESITY (ICD-278.00) - unable to diet effectively and get weight down... weight = 224#, we discussed diet and exercise program... again!  GERD (ICD-530.81) - EGD 7/05 by DrStark w/ HH- not currently on meds, but we are adding Protonix/ Zantac today...  COLONIC POLYPS (ICD-211.3) - last colonoscopy was 12/03 showing several 3-74mm polyps (hyperplastic)...  HX OF GALLSTONE (ICD-V12.79) - seen on CT 7/06 and referred to [facility]- eval by DrCornett and being followed...  DEGENERATIVE JOINT DISEASE (ICD-715.90) - followed by DrBeane for ortho... Right knee arthroscopy 1/09 without help... s/p right TKR 06/12/08 by Tora Perches... 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 she uses ETODOLAC as needed.  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                                   DISCHARGE SUMMARY  8/ 21-24 /09  FINAL DIAGNOSES:   1. Admitted July 21, 2008, with respiratory distress--an apparent allergic reaction to lisinopril.  The patient responded to emergency treatment in the emergency room and was admitted for observation.   2. History of hypertension--blood pressures in the 120/80 range off medication.   3. Coronary artery disease--she has known nonobstructive coronary disease and good left ventricular function.   4. Venous insufficiency--treated with Lasix as needed.   5. Hypercholesterolemia on Crestor.   6. Obesity--the patient is trying to lose weight on diet.   7. Gastroesophageal reflux disease.   8. History of colon polyps.   9. History of gallstones seen on previous CT scan and being followed by Dr. Luisa Hart.    10.Degenerative arthritis, followed Dr. Shelle Iron, with recent right total knee arthroplasty (June 12, 2008).   11.History of low back pain.   12.History of Arnold-Chiari malformation and seizure disorder.   13.History of reflex sympathetic dystrophy in her right hand.    MEDICATIONS AT DISCHARGE:   1. Pro-Air two puffs every 6 hours as needed for wheezing.   2. Enteric-coated aspirin 81 mg p.o. daily.   3. Furosemide 20 mg p.o. daily as needed for swelling.   4. Crestor 5 mg p.o. daily.   5. Etodolac 400 mg p.o. b.i.d. with food as needed for arthritis pain.   6. Vicodin 1 tablet every 6 hours as needed for severe pain.   7. Multivitamin daily.   8. Lorazepam 0.5 mg p.o. t.i.d. as needed for nerves.        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  The patient denies anorexia, fever, weight loss, weight gain,  vision loss, decreased hearing, hoarseness, chest pain, syncope, peripheral edema, prolonged cough, headaches, hemoptysis, abdominal pain, melena, hematochezia, severe indigestion/heartburn, hematuria, incontinence, muscle weakness, suspicious skin lesions, transient blindness, depression, unusual weight change, abnormal bleeding, enlarged lymph nodes, and angioedema.     Physical Exam  WD, Overweight, 70 y/o BF in NAD... GENERAL:  Alert & oriented; pleasant & cooperative... HEENT:  [place]/AT, EOM-wnl, PERRLA, EACs-clear, TMs-wnl, NOSE-clear, THROAT-clear &  wnl, no lesions seen... NECK:  Supple w/ decrROM; no JVD; normal carotid impulses w/o bruits; no thyromegaly or nodules palpated; no lymphadenopathy. CHEST:  Clear to P & A; without wheezes/ rales/ or rhonchi heard... HEART:  Regular Rhythm, gr 1/6 SEM without rubs or gallops... ABDOMEN:  Obese, soft & nontender; normal bowel sounds; no organomegaly or masses detected. EXT:  right patellar bursitis w/ fluid, s/p right TKR, mod arthritic changes; no varicose veins/ +venous insuffic/ tr edema. NEURO:  CN's intact; no focal neuro deficits... DERM:  No lesions noted; no rash etc...      Impression & Recommendations:  Problem # 1:  OTHER SYMPTOMS INVOLVING HEAD AND NECK (ICD-784.99) C/O throat pain, congestion, and discomfort occuring  ~ 5pm and again 2-3am nightly... no apparent stridor, no chr voice changes, edema, etc.... etiology is unclear but she is NOT on ACE or ARB Rx at this point... poss for LER/ reflux related... REC ~ start Protonix before dinner and Zantac 300mg  Qhs... refer to ENT for further eval... Orders: ENT Referral (ENT)   Problem # 2:  HYPERTENSION (ICD-401.9) Stable off meds on diet Rx alone... encouraged to lose weight... Her updated medication list for this problem includes:    Furosemide 20 Mg Tabs (Furosemide) .Marland Kitchen... Take 1 tab by mouth once daily as needed for swelling that doesn't go down overnight...   Problem  # 3:  CAD (ICD-414.00) Stable- on ASA, Crestor, diet + exercise... Her updated medication list for this problem includes:    Adult Aspirin Ec Low Strength 81 Mg Tbec (Aspirin) .Marland Kitchen... Take 1 tablet by mouth once a day    Furosemide 20 Mg Tabs (Furosemide) .Marland Kitchen... Take 1 tab by mouth once daily as needed for swelling that doesn't go down overnight...   Problem # 4:  VENOUS INSUFFICIENCY (ICD-459.81) She eliminates sodium, elevates, support hose + Lasix 20mg  as needed.  Problem # 5:  HYPERCHOLESTEROLEMIA (ICD-272.0) Stable- same med. Her updated medication list for this problem includes:    Crestor 5 Mg Tabs (Rosuvastatin calcium) .Marland Kitchen... Take 1 tablet by mouth once a day   Problem # 6:  GERD (ICD-530.81) G I as noted... Her updated medication list for this problem includes:    Pantoprazole Sodium 40 Mg Tbec (Pantoprazole sodium) .Marland Kitchen... Take 1 tab by mouth once daily- 30 min before dinner...    Ranitidine Hcl 300 Mg Tabs (Ranitidine hcl) .Marland Kitchen... Take 1 tab by mouth at bedtime...   Problem # 7:  DEGENERATIVE JOINT DISEASE (ICD-715.90) S/P TKR per DrBeane... Her updated medication list for this problem includes:    Adult Aspirin Ec Low Strength 81 Mg Tbec (Aspirin) .Marland Kitchen... Take 1 tablet by mouth once a day    Etodolac 400 Mg Tabs (Etodolac) .Marland Kitchen... 1 tab by mouth two times a day w/ food as needed for arthritis pain...    Hydrocodone-acetaminophen 5-325 Mg Tabs (Hydrocodone-acetaminophen) .Marland Kitchen... 1 tab by mouth q4h as needed for pain...   Complete Medication List: 1)  Claritin 10 Mg Tabs (Loratadine) .... As needed 2)  Proair Hfa 108 (90 Base) Mcg/act Aers (Albuterol sulfate) .Marland Kitchen.. 1-2 sprays every 6 hours as needed for wheezing... 3)  Adult Aspirin Ec Low Strength 81 Mg Tbec (Aspirin) .... Take 1 tablet by mouth once a day 4)  Furosemide 20 Mg Tabs (Furosemide) .... Take 1 tab by mouth once daily as needed for swelling that doesn't go down overnight... 5)  Crestor 5 Mg Tabs (Rosuvastatin calcium)  .... Take 1 tablet by mouth once a  day 6)  Etodolac 400 Mg Tabs (Etodolac) .Marland Kitchen.. 1 tab by mouth two times a day w/ food as needed for arthritis pain.Marland KitchenMarland Kitchen 7)  Hydrocodone-acetaminophen 5-325 Mg Tabs (Hydrocodone-acetaminophen) .Marland Kitchen.. 1 tab by mouth q4h as needed for pain.Marland KitchenMarland Kitchen 8)  Lorazepam 0.5 Mg Tabs (Lorazepam) .... Take one tablet by mouth three times a day as needed 9)  Multivitamins Tabs (Multiple vitamin) .... Take 1 tablet by mouth once a day 10)  Magic Mouthwash  .... 1 tsp swish and swallow four times daily as needed... 11)  Pantoprazole Sodium 40 Mg Tbec (Pantoprazole sodium) .... Take 1 tab by mouth once daily- 30 min before dinner... 12)  Ranitidine Hcl 300 Mg Tabs (Ranitidine hcl) .... Take 1 tab by mouth at bedtime...   Patient Instructions: 1)  Today we updated your med list- see below.... 2)  We refilled your Magic Mouthwash and Proair per request... 3)  We also perscribed two new meds for your throat symptoms... PROTONIX 40mg - take one in evening about 30 min before dinner;  and ZANTAC 300mg - take one at bedtime.Marland KitchenMarland Kitchen  4)  We will also make a referral to ENT for further evaluation.Marland KitchenMarland Kitchen 5)  Call for any problems...   Prescriptions: RANITIDINE HCL 300 MG TABS (RANITIDINE HCL) take 1 tab by mouth at bedtime...  #30 x prn   Entered and Authorized by:   Michele Mcalpine MD   Signed by:   Michele Mcalpine MD on 08/15/2008   Method used:   Print then Give to Patient   RxID:   [id number] PANTOPRAZOLE SODIUM 40 MG TBEC (PANTOPRAZOLE SODIUM) take 1 tab by mouth once daily- 30 min before dinner...  #30 x prn   Entered and Authorized by:   Michele Mcalpine MD   Signed by:   Michele Mcalpine MD on 08/15/2008   Method used:   Print then Give to Patient   RxID:   [id number] MAGIC MOUTHWASH 1 tsp swish and swallow four times daily as needed...  #4 oz x prn   Entered and Authorized by:   Michele Mcalpine MD   Signed by:   Michele Mcalpine MD on 08/15/2008   Method used:   Print then Give to Patient    RxID:   [id number] PROAIR HFA 108 (90 BASE) MCG/ACT  AERS (ALBUTEROL SULFATE) 1-2 sprays every 6 hours as needed for wheezing...  #1 x prn   Entered and Authorized by:   Michele Mcalpine MD   Signed by:   Michele Mcalpine MD on 08/15/2008   Method used:   Print then Give to Patient   RxID:   [id number]  ]

## 2010-12-31 NOTE — Progress Notes (Signed)
Summary: returning call  Phone Note Call from Patient   Caller: Patient Call For: nadel/tammy p Reason for Call: Talk to Nurse Summary of Call: returning call to Mekiah Wahler from 11/06/2008/apc Initial call taken by: Eugene Gavia,  November 07, 2008 8:20 AM  Follow-up for Phone Call        [facility] Boone Master CNA  November 07, 2008 9:27 AM   spoke with patient, notified of lab results and to continue with Tammy's recs. Follow-up by: Boone Master CNA,  November 13, 2008 2:20 PM

## 2010-12-31 NOTE — Progress Notes (Signed)
Summary: pt returned call  Phone Note Call from Patient   Caller: Patient Call For: libby/ rhonda Summary of Call: pt returned call from libby this am.  Initial call taken by: Tivis Ringer,  September 18, 2008 2:19 PM  Follow-up for Phone Call        called pt back had to leave message again to call me back Follow-up by: Oneita Jolly,  September 19, 2008 11:27 AM

## 2011-01-02 NOTE — Progress Notes (Signed)
Summary: Hydrocodone back order  Phone Note Call from Patient Call back at [phone]   Caller: Patient Summary of Call: Pt c/o Hydrocodone 5/325mg , per pharmacy on national back order for Hydrocodone 5/325mg . Per SN called in Hydrocodone 5/500mg  Take 1/2-1 tablet by mouth every 6-8 hours as needed. #100x1. Pt informed.  Initial call taken by: Zackery Barefoot CMA,  November 26, 2010 4:09 PM    New/Updated Medications: HYDROCODONE-ACETAMINOPHEN 5-325 MG  TABS (HYDROCODONE-ACETAMINOPHEN) *****on national back order****** (11/26/10) 1 tab by mouth every 6-8 H as needed for pain... HYDROCODONE-ACETAMINOPHEN 5-500 MG TABS (HYDROCODONE-ACETAMINOPHEN) Take 1/2-1 tablet by mouth every 6 to 8 hours as needed for pain Prescriptions: HYDROCODONE-ACETAMINOPHEN 5-500 MG TABS (HYDROCODONE-ACETAMINOPHEN) Take 1/2-1 tablet by mouth every 6 to 8 hours as needed for pain  #100 x 1   Entered by:   Zackery Barefoot CMA   Authorized by:   Michele Mcalpine MD   Signed by:   Zackery Barefoot CMA on 11/26/2010   Method used:   Telephoned to ...       [facility] [address] [phone]* (retail)       [address]       [place], [place]  [address]       Ph: [phone] or [phone]       Fax: [phone]   RxID:   [id number]

## 2011-04-15 NOTE — Discharge Summary (Signed)
NAMEANTONAE, [name]                 ACCOUNT NO.:  [id number]   MEDICAL RECORD NO.:  [id number]          PATIENT TYPE:  INP   LOCATION:  1619                         FACILITY:  [facility]   PHYSICIAN:  Jene Every, M.D.    DATE OF BIRTH:  [birth year]   DATE OF ADMISSION:  06/12/2008  DATE OF DISCHARGE:  06/16/2008                               DISCHARGE SUMMARY   ADMISSION DIAGNOSES:  1. Degenerative joint disease right knee.  2. Hypercholesteremia.  3. Hypertension.  4. Coronary artery disease.  5. Hypokalemia.  6. Obesity.  7. Gastroesophageal reflux disease.   DISCHARGE DIAGNOSES:  1. Status post right total knee arthroplasty.  2. Asymptomatic postoperative anemia.   HISTORY:  [name] is a pleasant 70 year old female who is well-known  to our practice.  We have been seeing her for quite some time  with  persistent knee pain.  She has undergone multiple cortisone injections  with only short-term relief.  Unfortunately, as of late, she has had  prepatellar bursitis, been aspirated multiple times.  All cultures have  been negative.  She has actually undergone arthroscopic debridement and  removal of the bursar with recurrent symptoms.  It is felt, at this  point,  the patient as failed conservative treatment and, therefore, she  would benefit from total knee arthroplasty.  Risks and benefits of this  were discussed with the patient.  She does wish to proceed.   PROCEDURE:  The patient taken to the OR on 06/12/2008, underwent a right  total knee arthroplasty.  Surgeon Dr. Jene Every, assistant Roma Schanz, PA-C.  Anesthesia general.  Complications none.  Consult PT/OT.  Care management   LABORATORY DATA:  Preoperative CBC showed a white cell count 3.5,  hemoglobin 11.7, hematocrit 35.5.  These were followed throughout the  hospital course.  White cell count remained normal at time of discharge  6.1, hemoglobin did trend downward at time of discharge 9, hematocrit  27.8; however, the patient remained asymptomatic.  Coagulation studies  done preoperatively showed PT of 13.7, INR 1, PTT of 28.  These were  monitored throughout the hospital course once the patient was placed on  Coumadin.  At time of discharge she was therapeutic with INR 2.3.  Routine chemistries done preoperatively showed sodium 142, potassium 4,  glucose of 103 with a normal BUN and creatinine.  Again, these were  followed throughout the hospital course.  Sodium did drop to 134 at time  of discharge, normalized to 140, potassium remained stable at 4.1,  glucose fluctuated, at the time of discharge 119.  Renal function  remained within normal range.  Preop urinalysis was negative.  Blood  type is B+.  Do not see a preoperative chest x-ray or EKG in the chart;  however, feel confident these were done and reviewed by anesthesia.   HOSPITAL COURSE:  The patient was admitted, taken to the OR and  underwent the above stated procedure without difficulty.  She was then  transferred to PACU and then to the orthopedic floor for continued  postop care.  One Hemovac drain was  placed intraoperatively.  The  patient was placed on PCA analgesics for pain relief.  She was also  placed on Coumadin for DVT prophylaxis.  Postop day #1 the patient was  doing well.  Pain was controlled.  She denied any nausea, vomiting,chest  pain or shortness of breath.  Vital signs were stable.  Labs were  stable.  Hemovac was discontinued with tip intact.  The patient was  weaned from her PCA.  Coumadin was continued.  The patient progressed  fairly well with therapy initially.  Postop day #2 dressing was changed.  She did have some reactive erythema with a questionable superficial  hematoma along the bursal area.  She was slightly febrile 102.2;  however, with treatment, temperature was 98.4.  Hemoglobin stable at  10.3, INR 1.6.  Motor and neurovascular function remained intact.  Calf  was soft, nontender.   Incentive spirometer was encouraged.  PCA was  discontinued.  IV was heplocked, again, discharge planning was  initiated.  The patient continued to progress well.  She was having some  issues with hypotension, also continued to run slightly low grade  temperature continued denying nausea, vomiting, chest pain or shortness  of breath.  Incision continued to be mildly erythematous, especially at  the  bursa.  Minimal edema.  We did give her 1 last dose of Ancef.  On  postop day #3, we continued to hold her lisinopril secondary to  hypotension. Trinsicon was started secondary to decreased hemoglobin.  On postop day #4, the patient was doing very well.  Pain was controlled  on p.o. analgesics.  She was voiding without difficulty.  She had good  p.o. intake.  She continued denying nausea, vomiting or dizziness.  Hemoglobin 9, hematocrit 27.8; however, she was asymptomatic.  There was  slight change in her incision with decreasing erythema.  She did  continue to have some mild swelling along the bursa, was felt to be more  superficial hematoma then anything.  Calf soft, nontender.  Motor  remained intact.  At this point it was felt the patient could be  discharged home with home health PT/OT.  She was therapeutic on her  Coumadin with INR of 2.3.   DISPOSITION:  The patient discharged home with home health PT/OT.  She  is to follow up with Dr. Shelle Iron in approximately 10-14 days for suture  removal and x-ray. Wound care is change dressing daily.  Keep the area  clean and dry.  Strict ice and elevation.  She is to continue to use her  knee immobilizer until she can straight leg raise x 10.   DISCHARGE MEDICATIONS:  1. Vitamin C 500 mg daily.  2. Multivitamin with iron  3. Norco 1-2 p.o. q.4-6 p.r.n. pain.  4. Robaxin 500 mg one p.o. q.8  p.r.n. spasm.  5. Coumadin 5 mg p.o. daily.   DISCHARGE DIET:  High fiber.   DISCHARGE CONDITION:  Condition on discharge is stable.   FINAL DIAGNOSIS:   Doing well status post a right total knee  arthroplasty.  Roma Schanz, P.A.      Jene Every, M.D.  Electronically Signed    CS/MEDQ  D:  07/13/2008  T:  07/13/2008  Job:  [id number]

## 2011-04-15 NOTE — H&P (Signed)
[name], WAWRZYNIAK                 ACCOUNT NO.:  [id number]   MEDICAL RECORD NO.:  [id number]          PATIENT TYPE:  INP   LOCATION:  1228                         FACILITY:  [facility]   PHYSICIAN:  Oretha Milch, MD      DATE OF BIRTH:  1941/01/29   DATE OF ADMISSION:  07/21/2008  DATE OF DISCHARGE:                              HISTORY & PHYSICAL   PRIMARY CARE PHYSICIAN:  Lonzo Cloud. Kriste Basque, MD.   CARDIOLOGIST:  Madolyn Frieze. Jens Som, MD, [facility].   HISTORY OF PRESENT ILLNESS:  Ms. Attwood is a pleasant 70 year old African  American woman who underwent a right total knee arthroplasty on June 12, 2008, after failing conservative therapy.  She was discharged on June 16, 2008, and was undergoing home physical therapy since then.  She has  a history of mild intermittent asthma but has never really had an  exacerbation, and her symptoms seem to have been well controlled on  ProAir on a p.r.n. basis.  I note that she had an ER admission and ER  visit in June 2009 for chest pain.  A preop evaluation by Dr. Jens Som  has been essentially normal.   While undergoing physical therapy at home today, she developed sudden-  onset dyspnea and vague substernal chest pain.  In discussing with the  ER physician, it seems that she was stridorous on arrival to the  emergency room and in severe respiratory distress.  She was given 2 mg  of Ativan and 125 mg of Solu-Medrol and an albuterol and Atrovent neb.  Subsequently she calmed down and started breathing much better.  A CT  scan was obtained, which showed patent pulmonary arteries and mild  bibasilar atelectasis.  No pulmonary infiltrates.  We are now consulted  for admission.  On arrival, her blood pressure was 109/48.  Since then,  the lowest blood pressure recorded was 94/39.  She is now somnolent due  to the Ativan but is able to provide a detailed history.  Her husband  was present during history taking.   Her past medical history includes:  1.  Hypertension.  Although the last hospital discharge states that she      was on lisinopril and HCTZ, which she was taking, on review of Dr.      Jodelle Green notes, it seems that he had stopped this on May 02, 2008,      due to low blood pressure.  I also note that on her last hospital      discharge her blood pressure was running low.  2. Coronary artery disease.  Cath in February 2005 showing  20 to 60%      obstruction in all 3 vessels with good EF.  Nuclear stress test in      March 2009 by Dr. Jens Som did not show any evidence of ischemia or      infarction, and she was supposed to be low ischemic risk for      surgery.  3. Hypercholesterolemia.  4. Obesity.  5. History of Arnold-Chiari malformation and seizures with  suboccipital craniectomy and C1 laminectomy by Dr. Gerlene Fee in 1993.  6. Reflex sympathetic dystrophy.   PAST SURGICAL HISTORY:  Status post hysterectomy, right total knee  arthroplasty July 2009 and right knee arthroscopy.  C-spine surgery in  1993.   FAMILY HISTORY:  Mother died at age 53 from heart problems and stroke.  Father alive at age 65.   SOCIAL HISTORY:  She is currently retired.  Smokes 1 to 3 cigarettes a  day every now and then.  She lives with her husband and 2 children.   No known drug allergies.   CURRENT MEDICATIONS:  1. Aspirin 81 mg once a day.  2. Calcium 500 mg once a day.  3. Etodolac 400 mg as needed.  4. Hydrocodone/acetaminophen 5/325 mg as needed.  5. Lisinopril/HCTZ 20/12.5 mg once a day.  6. KCl 20 mEq once daily.  7. ProAir 1 to 2 puffs as needed.   REVIEW OF SYSTEMS:  Currently denies chest pain.  Reports that her  breathing is much improved.  Denies pedal edema and difficulty walking.  She feels the urge to make urine.   PHYSICAL EXAMINATION:  GENERAL:  An adult woman, somnolent but arousable  enough to give a detailed history.  In no acute respiratory distress.  VITAL SIGNS:  Blood pressure 100/47, heart rate 60 per  minute,  respirations 20 per minute, oxygen saturation 100% on 2 liters nasal  cannula.  HEENT:  Moist mucosa.  Pupils 3 mm, reactive to light.  NECK:  Supple.  No JVD.  No lymphadenopathy.  Mallampati class III  airway.  CARDIOVASCULAR:  S1 and S2 normal.  CHEST:  Decreased breath sounds, both bases.  ABDOMEN:  Soft, nontender.  NEUROLOGIC:  Nonfocal.  EXTREMITIES:  No edema.  Right knee surgical scar appears healed.   LABORATORY:  BNP less than 30.  D-dimer was positive.  Troponin was less  than 0.05.  Arterial blood gas was 7.38/42/70 on room air.  Hemoglobin  was 10.3, WBC count 5.2, platelets 252.  There were 50% neutrophils.  Sodium138, potassium 4.2, bicarbonate 27, BUN and creatinine 24 and 1.4   IMPRESSION:  1. Sudden onset respiratory distress.  Seems to be related to upper      airway instability rather than acute asthma.  Pulmonary embolism      seems to have been ruled out.  2. Mild hypotension, responding to IV fluids.  Note that Dr. Kriste Basque had      stopped the lisinopril.  This was restarted on hospital discharge      after her surgery.  3. History of Arnold-Chiari malformation and surgery for the same.   I wonder if her upper airway instability is related to the ACE inhibitor  or to the fact that she has had surgery for Arnold-Chiari malformation.   RECOMMENDATIONS:  1. Lisinopril will be held.  2. We will continue IV fluids at 150 an hour and insert a Foley.  Her      blood pressure seems improved now, but urine output will be      monitored.  3. Lovenox will be used for DVT prophylaxis.  4. A 12-lead EKG will be obtained.  Cardiac enzymes are negative.  5. Xanax will be used p.r.n. for anxiety and hydrocodone/acetaminophen      combination will be used for pain.  6. Albuterol nebs will be continued.  I did not feel the need for      steroids at the current time; however, if bronchospasm is  auscultated then we may reinstitute Solu-Medrol 40 mg q.8 h.    She will be observed in the step-down for the next 12-24 hours for  further such occurrences.      Oretha Milch, MD  Electronically Signed     RVA/MEDQ  D:  07/21/2008  T:  07/21/2008  Job:  [id number]

## 2011-04-15 NOTE — Op Note (Signed)
NAMEJODI, [name]                 ACCOUNT NO.:  [id number]   MEDICAL RECORD NO.:  [id number]          PATIENT TYPE:  AMB   LOCATION:  NESC                         FACILITY:  [facility]   PHYSICIAN:  Jene Every, M.D.    DATE OF BIRTH:  [birth year]   DATE OF PROCEDURE:  12/06/2007  DATE OF DISCHARGE:                               OPERATIVE REPORT   PREOPERATIVE DIAGNOSES:  1. Degenerative joint disease, right knee.  2. Prepatellar bursal effusion.   POSTOPERATIVE DIAGNOSES:  1. Degenerative joint disease, right knee.  2. Prepatellar bursal effusion.   PROCEDURES PERFORMED:  1. Excision of prepatellar bursa.  2. Right knee arthroscopy, debridement, partial medial meniscectomy.   ANESTHESIA:  General.   No assistant.   BRIEF HISTORY AND INDICATIONS:  This is a 70 year old with right lower  extremity and knee pain, varus deformity of the knee, degenerative  changes, and also having refractory prepatellar bursitis.  She had a  previous aspiration and injection of the bursa, had failed that.  She  had persistent preop prepatellar significant bursal swelling without  evidence of infection.  She was indicated for excision of the bursa  followed by a knee lavage and debridement.  She was told that eventually  she may require a total knee replacement.  She did not want to proceed  with that at this point in time.  We discussed the risks and the  procedure, including bleeding, infection, DVT, PE, no change in  symptoms, worsening symptoms, need for repeat debridement, total knee  arthroplasty, recurrent prepatellar bursitis, skin problems, etc.   TECHNIQUE:  In supine position after induction of adequate anesthesia  and 2 g of Kefzol, the right lower extremity was prepped and draped in  the usual sterile fashion.  I made a 3-cm incision over the midline of  the skin over the anterior aspect of the knee full-thickness,  encountering approximately 20 mL of clear fluid.  This was evacuated  and  the prepatellar bursa area was irrigated.  There was no evidence of pus  or infection.  Hypertrophic bursa was noted along the anterior aspect of  the patellar tendon.  This was debrided and excised and a portion of  this was sent to pathology for specimen.  The skin had good vascularity.  There was good vascularity although it as somewhat bent.  We debrided  only the bursa off the tendon and the patellar ligament.  Following that  the wound was copiously irrigated and fully inspected and I feel no  evidence of a rent that seemed to communicate with the knee.  I then  repaired the tissue, tacking it down to the retinacular tissue with 2-0  Vicryl simple sutures and then 3-0 nylon simple sutures were used to  close the skin.  Good bleeding tissue at the skin edges was noted  following the debridement.  Next the area was reprepped with Betadine.  I then made a lateral parapatellar incision and a medial parapatellar  incision and I placed an arthroscopic camera into the lateral portal.  Irrigant was utilized to insufflate the joint.  The medial compartment  revealed extensive grade 4 changes of the tibial plateau and femoral  condyle.  There was a torn piece of the medial meniscus extending into  the joint that appeared to be getting caught between the femur and the  tibia with flexion/extension.  I therefore introduced a shaver and  utilized to resect this to a stable base.  The remainder of the meniscus  was degenerative in nature posteriorly.  This was debrided as well, but  predominantly significant grade 4 changes were seen in the compartment.  The ACL was unremarkable.  The lateral compartment was extremely tight.  In fact, we were unable to access that.  With arthroscopic visualization  we were able to slightly open the joint and lavage that.  We also  evaluated the patellofemoral joint and there were degenerative changes  here as well.  The knee was lavaged here.  I looked up  underneath the  patellar ligament and the patella.  I saw no evidence of an obvious tear  or rent that would extend into the prepatellar bursa.  After the lavage,  all instrumentation was removed.  Portals were closed with 4-0 nylon  simple sutures.  I placed a compressive dressing over the prepatellar  incision and the portals, secured it with Webril and two Ace bandages.  The patient was then awoken without difficulty and transported to the  recovery room in satisfactory condition.   The patient tolerated the procedure well with no complications.  We sent  a specimen for the prepatellar bursa.      Jene Every, M.D.  Electronically Signed     JB/MEDQ  D:  12/06/2007  T:  12/06/2007  Job:  [id number]

## 2011-04-15 NOTE — H&P (Signed)
NAMEAMARRIA, ANDREASEN                 ACCOUNT NO.:  [id number]   MEDICAL RECORD NO.:  [id number]          PATIENT TYPE:  INP   LOCATION:  NA                           FACILITY:  [facility]   PHYSICIAN:  Jene Every, M.D.    DATE OF BIRTH:  1941-09-06   DATE OF ADMISSION:  06/12/2008  DATE OF DISCHARGE:                              HISTORY & PHYSICAL   CHIEF COMPLAINTS:  Right knee pain and swelling.   HISTORY:  [name] is well-known to our practice.  We have been seeing  her for quite some time for persistent knee pain.  She has undergone  cortisone injections in the past which helped short-term, but  unfortunately earlier this year she had a fall landing on her right knee  and since that time has had persistent disabling pain with significant  swelling in the prepatellar bursa.  This area was aspirated multiple  times.  She even underwent arthroscopic debridement and surgical  excision of the bursa, but following that surgery, had continued  persistent pain as well as swelling into the knee and into the  prepatellar sac.  This area was aspirated again following her surgery,  sent for multiple cultures and tests which all came back negative for  any kind of infection or fundus.  It is felt at that this time, that the  patient has exacerbated all forms of conservative treatment at this  point and we feel the only step would be to proceed with a total knee  replacement.  The risks and benefits of this were discussed with the  patient.  All questions and concerns were answered and she does wish to  proceed.   PAST MEDICAL HISTORY:  1. Hypercholesterolemia.  2. Hypertension.  3. Coronary artery disease.  4. Hypokalemia.  5. Obesity.  6. Gastroesophageal reflux disease.   CURRENT MEDICATIONS:  1. ProAir HFA 180 mcg 1-2 sprays every 6 hours as needed for wheezing.  2. Aspirin 81 mg daily.  3. Lisinopril/hydrochlorothiazide 20/12.5 one p.o. daily.  The patient      has currently  stopped this due to hypotensive event.  4. Potassium 20 mEq one p.o. daily.  5. Crestor 5 mg one p.o. daily.  6. __________  400 mg one p.o. b.i.d.  7. Hydrocodone 5/325 one p.o. q.4 h. p.r.n. pain.  8. Multivitamins.  9. Promethazine with codeine 6.25/10 mg 1 teaspoon by mouth q.4-6 h      p.r.n. cough.   ALLERGIES:  None.   SOCIAL HISTORY:  Patient is retired.  She states she smokes  approximately once a week.  Denies any alcohol consumption.  She has  family that will help take care of her following surgery.   PAST SURGERIES:  1. Right knee arthroscopy with bursectomy.  2. Right rotator cuff repair.  3. Surgery on her right hand.  4. Partial hysterectomy.  5. Previous cervical surgery in 1993.   FAMILY HISTORY:  Father is alive with history of arthritis at age 70.  Mother deceased in her 55s of coronary artery disease.   REVIEW OF SYSTEMS:  GENERAL:  The patient denies any fever, chills,  night sweats or bleeding tendencies.  CNS:  No blurry or double vision,  seizure, headache or paralysis.  RESPIRATORY:  No shortness of breath,  productive cough or hemoptysis.  CARDIOVASCULAR:  No chest pain, angina  or orthopnea.  GU: No dysuria, hematuria or discharge.  GI:  No nausea,  vomiting, diarrhea, constipation, melena or bloody stools.  MUSCULOSKELETAL:  As in HPI.   PHYSICAL EXAMINATION:  VITAL SIGNS:  The patient weighs 220.  She is 5  feet 5.  Pulse is 56, respiratory is 12, BP is 130/90.  GENERAL:  This is an obese female seen upright in no acute distress.  She does walk with an antalgic gait.  HEENT:  Atraumatic, normocephalic.  Pupils equal, round and reactive to  light.  EOMs intact.  NECK:  Supple.  No lymphadenopathy.  CHEST: Clear to auscultation bilaterally.  Currently no rhonchi, wheezes  or rales.  BREASTS/GU:  Not examined, not pertinent to HPI.  HEART:  Regular rate and rhythm without murmurs, gallops or rubs.  ABDOMEN:  Protuberant, soft and nontender.   Bowel sounds x4.  SKIN:  No rashes or lesions are noted.  EXTREMITIES:  In regard to the knee, there is some continued swelling  over the prepatellar bursa.  There is no erythema.  She does have  limited flexion.  She is tender along the medial joint line.  Calf soft  and nontender.   IMPRESSION:  Degenerative joint disease right knee.   PLAN:  The patient will be admitted to [facility] and undergo  right total knee arthroplasty.  We did obtain medical clearance from Dr.  Kriste Basque who is with [facility] as well as Dr. Jens Som, who is  her cardiologist.      Roma Schanz, P.A.      Jene Every, M.D.  Electronically Signed    CS/MEDQ  D:  06/05/2008  T:  06/05/2008  Job:  [id number]

## 2011-04-15 NOTE — Discharge Summary (Signed)
[name], [name]                 ACCOUNT NO.:  [id number]   MEDICAL RECORD NO.:  [id number]          PATIENT TYPE:  INP   LOCATION:  1419                         FACILITY:  [facility]   PHYSICIAN:  Lonzo Cloud. Kriste Basque, MD     DATE OF BIRTH:  13-May-1941   DATE OF ADMISSION:  07/21/2008  DATE OF DISCHARGE:  07/24/2008                               DISCHARGE SUMMARY   FINAL DIAGNOSES:  1. Admitted July 21, 2008, with respiratory distress--an apparent      allergic reaction to lisinopril.  The patient responded to      emergency treatment in the emergency room and was admitted for      observation.  2. History of hypertension--blood pressures in the 120/80 range off      medication.  3. Coronary artery disease--she has known nonobstructive coronary      disease and good left ventricular function.  4. Venous insufficiency--treated with Lasix as needed.  5. Hypercholesterolemia on Crestor.  6. Obesity--the patient is trying to lose weight on diet.  7. Gastroesophageal reflux disease.  8. History of colon polyps.  9. History of gallstones seen on previous CT scan and being followed      by Dr. Luisa [name].  10.Degenerative arthritis, followed Dr. Shelle [name], with recent right total      knee arthroplasty (June 12, 2008).  11.History of low back pain.  12.History of Arnold-Chiari malformation and seizure disorder.  13.History of reflex sympathetic dystrophy in her right hand.   BRIEF HISTORY AND PHYSICAL:  The patient is a 70 year old black woman  who underwent a right total knee arthroplasty June 12, 2008, by Dr.  Shelle [name].  She had been getting home therapy.  She has an underlying  history of mild intermittent asthma without exacerbations and used Pro-  Air as needed.  While undergoing physical therapy at home on the day of  admission, she developed sudden onset of dyspnea and substernal chest  discomfort.  She was brought to the emergency room, where she was found  to be stridorous and treated  emergently with IV Solu-Medrol, nebulizer  treatments, and Ativan.  She responded to this treatment and improved.  On review of her medications, it was apparent that she had restarted her  lisinopril, which had previously been discontinued.  The episode was  felt to be a reaction to the ACE inhibitor.   PAST MEDICAL HISTORY:  1. As noted, she has a history of mild hypertension and had been      monitoring her pressure off of the lisinopril.  2. She has nonobstructive coronary disease and takes aspirin.  3. She has venous insufficiency and uses Lasix as needed.  4. She has hypercholesterolemia on Crestor.  5. She has a history of obesity and has been trying get her weight      down with diet therapy and looking forward to increasing her      exercise.  6. She has a history gastroesophageal reflux disease and colon polyps.  7. She has known gallstones seen on previous CT scan and followed by  Dr. Luisa [name].  8. She has degenerative arthritis as noted.  9. She has a history of an Arnold-Chiari malformation and seizures and      had a suboccipital craniotomy and C1 laminectomy by Dr. Gerlene [name] in      1993.  10.She has a reflex sympathetic dystrophy problem involving her right      hand and had previous nerve block.   PHYSICAL EXAMINATION:  GENERAL:  A 70 year old black female in mild  distress in the emergency room.  VITAL SIGNS:  Blood pressure 100/50, pulse 60 per minute, regular,  respirations 20 per minute and not labored at the time of her  evaluation, O2 sat 100% on 2 liters.  HEENT:  Unremarkable.  Throat exam showed some mild edema.  Voice was  slightly hoarse.  NECK:  No jugular venous distention, no carotid bruits.  CHEST:  Decreased breath sounds, but clear, without wheezes, rales, or  rhonchi heard.  CARDIAC:  Normal S1 and S2, without murmurs, rubs, or gallops detected.  ABDOMEN:  Soft and nontender, without evidence organomegaly or masses.  EXTREMITIES:  No cyanosis,  clubbing, or edema.  She had recent right  total knee surgery.  NEUROLOGIC:  Intact without focal abnormalities detected.   EKG showed normal sinus rhythm and minor nonspecific ST-T wave changes.  Chest x-ray showed prior right rotator cuff repair, mild tortuosity of  the thoracic aorta, minimal left basilar atelectasis, no acute  cardiopulmonary disease.  CT angio of the chest showed no evidence of  pulmonary emboli.  There was mild cardiomegaly and peribronchial  thickening, with some dependent atelectasis noted.  Hemoglobin 10.3,  hematocrit 32.8, white count of 5200, with a normal differential.  Sodium 134, potassium 4.1, chloride 103, CO2 24, BUN 15, creatinine 0.8,  blood sugar 200, alk phos 80, SGOT 23, SGPT 13, total protein 6.2,  albumin 2.8, calcium 8.9.  BNP less than 30.  D-dimer 1.05.  CPK 224,  with negative MB and negative troponin.   HOSPITAL COURSE:  This episode of dyspnea and stridor was felt to be  related to her ACE inhibitor, which she had inappropriately restarted  after her knee surgery.  This had been discontinued in June and was  stopped on admission and she was counseled extensively about this.  She  was monitored carefully on the ward for two days, given nebulizer  treatments as needed.  She was also given lorazepam for anxiety.  She  had no further stridor or wheezing and was felt to be MHB and ready for  discharge on July 24, 2008.   MEDICATIONS AT DISCHARGE:  1. Pro-Air two puffs every 6 hours as needed for wheezing.  2. Enteric-coated aspirin 81 mg p.o. daily.  3. Furosemide 20 mg p.o. daily as needed for swelling.  4. Crestor 5 mg p.o. daily.  5. Etodolac 400 mg p.o. b.i.d. with food as needed for arthritis pain.  6. Vicodin 1 tablet every 6 hours as needed for severe pain.  7. Multivitamin daily.  8. Lorazepam 0.5 mg p.o. t.i.d. as needed for nerves.   She was counseled to stop her lisinopril and her chart is labeled  allergic to ACE  INHIBITORS.   CONDITION ON DISCHARGE:  Improved.   DISPOSITION:  The patient will be discharged home.  Will follow up in  the office in 2 weeks.      Lonzo Cloud. Kriste Basque, MD  Electronically Signed     SMN/MEDQ  D:  07/24/2008  T:  07/24/2008  Job:  [id number]

## 2011-04-15 NOTE — Assessment & Plan Note (Signed)
[facility]                            CARDIOLOGY OFFICE NOTE   KAMILL, FULBRIGHT                        MRN:          [id number]  DATE:02/21/2009                            DOB:          29-Aug-1941    Ms. Fullam is a pleasant female who has a history of coronary disease.  She did have a cardiac catheterization in February 2005.  At that time,  she was found to have moderate coronary disease with the most  significant being a 60% LAD.  Her ejection fraction was normal.  Her  last Myoview was on February 11, 2008, and showed no significant  abnormality and an ejection fraction was 68%.  Since I last saw her, she  does occasionally have dyspnea, but attributes this to her asthma.  She  also occasionally has chest pain.  It can occur either under the left  breast or the right breast.  It is not exertional.  It is a sharp pain  without radiation, and there is no associated nausea, vomiting,  shortness of breath, or diaphoresis.  It resolves spontaneously by  itself.  Note, she does not have exertional chest pain.  There is no  pedal edema.   Her medications at present include Symbicort, ProAir, aspirin 81 mg p.o.  daily, Crestor 5 mg p.o. daily, hydrocodone, multivitamin, Nasacort.   PHYSICAL EXAMINATION:  VITAL SIGNS:  Blood pressure 104/72 and her pulse  is 63.  HEENT:  Normal.  NECK:  Supple.  CHEST:  Clear.  CARDIOVASCULAR:  Regular rate.  ABDOMEN:  No tenderness.  EXTREMITIES:  No edema.   Her electrocardiogram shows sinus rhythm at a rate of 63.  Nonspecific  ST changes.   DIAGNOSES:  1. Coronary artery disease - Ms. Altman has had some atypical chest      pain, but these have been longstanding and they are unchanged      compared to previous.  She does not have exertional chest pain.      Her last Myoview was normal.  We will not pursue this further at      this point.  She will continue on her aspirin and statin.  Her ACE      inhibitor has  been discontinued as her blood pressure is controlled      on no medicines.  2. Tobacco abuse - we discussed the importance of discontinuing this.  3. Hypertension - her blood pressure is controlled on no medications      at present.  4. Hyperlipidemia - she will continue on her statin.  We will check      lipids and liver and adjust as indicated.  We will also check a      BMET and a CK as she is having some arthralgias.  5. Gastroesophageal reflux disease.  6. History of polyps.  7. History of deep venous thrombosis.   I will see her back in 12 months or sooner if necessary.     Madolyn Frieze Jens Som, MD, [facility]  Electronically Signed    BSC/MedQ  DD: 02/21/2009  DT: 02/21/2009  Job #: [id number]

## 2011-04-15 NOTE — Op Note (Signed)
[name], [name]                 ACCOUNT NO.:  [id number]   MEDICAL RECORD NO.:  [id number]          PATIENT TYPE:  INP   LOCATION:  0007                         FACILITY:  [facility]   PHYSICIAN:  Jene Every, M.D.    DATE OF BIRTH:  25-Aug-1941   DATE OF PROCEDURE:  06/12/2008  DATE OF DISCHARGE:                               OPERATIVE REPORT   PREOPERATIVE DIAGNOSES:  Degenerative joint disease, right knee,  prepatellar chronic bursa.   POSTOPERATIVE DIAGNOSES:  Degenerative joint disease, right knee,  prepatellar chronic bursa.   PROCEDURE PERFORMED:  1. Right total knee arthroplasty.  2. Excision of patellar bursa.   ANESTHESIA:  General.   ASSISTANT:  Roma Schanz, P.A.   COMPONENTS UTILIZED:  A DePuy rotating platform, 4-femur, 3-tibia, and  35-patella.   BRIEF HISTORY AND INDICATIONS:  A 70 year old, refractory pain from end-  stage osteoarthrosis of the knee.  Operative intervention is indicated  for replacement of degenerated joint.  Also had a recurrent prepatellar  bursa that recurred.  It was aspirated multiple times with clear fluid,  negative culture, and it continued to recur.  Is indicated for excision  of that bursa during the procedure.  Risks and benefits were discussed  including bleeding, infection, damage to vascular structures, suboptimal  range of motion, DVT, PE, component failure, need for revision, etc.   TECHNIQUE:  With the patient in the supine position after evidence of  adequate general anesthesia, 1 g of vancomycin, the right lower  extremity was prepped and draped and exsanguinated in the usual sterile  fashion.  Thigh  tourniquet inflated to 325 mmHg.  Midline small  incision was made.  Clear fluid was evacuated from the prepatellar  bursa.  She had a fair amount of that fluid, approximately 20 mL.  Midline incision was then made.  I excised the prepatellar bursa which  was fairly expansile with fibrotic tissue demarcating the region  of the  inflammation.   At least anterolaterally we debrided as much of the bursa that we felt  was appropriate, still leaving a portion of the subcutaneous tissue.  We  then proceeded with the total knee replacement.  Median parapatellar  arthrotomy was performed.  The patella was everted, knee was flexed.  Tricompartmental osteoarthrosis was noted.  Leksell rongeur utilized to  remove osteophytes.  Medial and lateral menisci removed, the ACL was  removed, we released medially, preserving the fascia of the MCL.  The  knee was flexed, Step drill was utilized to enter the femoral canal,  irrigated, 5-degrees right was placed, 10-distal femoral cut was then  made, 10 mm was taken.  We then turned our attention to the fibula due  to the fairly tight joint.  The tibia was subluxed, the remaining medial  and lateral menisci posteriorly were removed, geniculates were  cauterized, PCL portion was removed as well.   The tibia was subluxed, the external alignment guide was then placed,  and then 10 mm off the high side was utilized.  Patella to the tibia,  tibia medial to the tibial tubercle, bisecting the ankle  joint distally.  It was pinned.  Oscillating saw utilized to perform the proximal tibial  cut, protecting the neural elements at all times.  Then in extension we  used a 10-block, which was found to be appropriate.  Then returned to  the femur.  It was flexed.  The distal trial was placed, it was measured  to a 4/10, in appropriate external rotation.  Anterior, posterior and  chamfer cuts were performed.  Then the knee was flexed, the tibia  prepared.  It was trialed at a 3 to give maximal coverage __________  maximum coverage.  The center drilled and the fin punch was utilized.  Trial femur and tibia.  Good extension and flexion with a 10-mm insert,  good stability in varus and valgus stress at 0 and 30 degrees.  Then  prepared the patella.  Measured 24 mm in thickness, removed 9 mm  with a  38-35 spacer.  We then clamped the patella, drilled our peg holes.  Trial patella was placed with the trial insert and the trial components.  Just prior to that we performed the center box cut with the tibial tray  and pinned it, bisecting the intercondylar notch, first oscillating saw  was utilized to perform a posterior cut.  That blade was preserved and  used to protect the femur.  We used the other saw to remove the  remainder of the notch, then prepared for the trial femoral component.  Good flexion, good extension, good tracking of the patella.  All  components were removed, we checked posteriorly, no osteophytes.  The  wound was copiously pulsatile lavaged, dried and flexed.  Mixed cement  on the posterior table.  We injected into the tibia, used bone wax on  the pin holes.  Used a 3-tibia and impacted into place, redundant cement  removed, 4-femur with cement on the runners and packed into place,  redundant cement removed.  A trial 10 was placed, it was reduced into  full extension, redundant cement removed.  The patellar button was then  clamped with cement, residual cement removed.  After residual cement  removed after full curing of cement, good patellofemoral tracking and  full extension, full flexion 140 degrees, no lift off, no instability,  varus and valgus stressing.  We removed the trial.  We checked  posteriorly and removed all redundant cement, irrigated the wound once  again.  We placed the 10-component, impacted into place again with full  extension, full flexion, with stability, good patellofemoral tracking.  No anterior drawer.  Next, we placed a Hemovac and brought it out  through a lateral stab wound in the skin.  Parapatellar arthrotomy with  #1 Vicryl figure-of-eight sutures, subcu with 2-0 Vicryl simple sutures,  and we tacked down the area of the bursa with multiple 3-0 sutures  to  close the space over the retinaculum.  Following this then we used   staples on the skin.  The wound was dressed sterilely.  Tourniquet was  deflated.  There was adequate vascularization of the lower extremity  appreciated.   The patient tolerated the procedure well.  No complications.   TOURNIQUET TIME:  2 hours.      Jene Every, M.D.  Electronically Signed     JB/MEDQ  D:  06/12/2008  T:  06/12/2008  Job:  [id number]

## 2011-04-15 NOTE — Assessment & Plan Note (Signed)
[facility]                            CARDIOLOGY OFFICE NOTE   [name], [name]                        MRN:          [id number]  DATE:02/21/2008                            DOB:          [birth year]    [name] is a very pleasant 70 year old female that I recently saw on  December 27, 2007.  She has a history of moderate coronary disease by  catheterization in February 2005.  When I saw her, we scheduled her to  have a Myoview for risk stratification.  This was performed on February 11, 2008.  It was normal with no evidence of ischemia or infarction and her  ejection fraction was 68%.  We also placed her on  lisinopril/hydrochlorothiazide for an elevated blood pressure and it is  much better.  Finally, we placed her on Crestor for history of coronary  disease.  She had follow-up liver functions on February 11, 2008, that were  normal.  She also had total cholesterol of 126 with an LDL of 49 and an  HDL of 56.  Since that time, she has not had chest pain, shortness  breath, palpitations or syncope.  There is no pedal edema.   MEDICATIONS AT PRESENT:  1. Multivitamin.  2. Aspirin 81 mg daily.  3. Lisinopril/hydrochlorothiazide 20/12.5 daily.  4. Crestor 5 mg p.o. daily.   PHYSICAL EXAMINATION:  VITAL SIGNS:  Today blood pressure of 118/78 and  her pulse is 86.  She weighs 217 pounds.  HEENT:  Normal.  NECK:  Supple.  There are no bruits.  CHEST:  Clear.  CARDIOVASCULAR:  Regular rate and rhythm.  ABDOMEN:  Exam shows no tenderness.  EXTREMITIES:  No edema.   DIAGNOSES:  1. Coronary artery disease - we will continue with her aspirin, ACE      inhibitor and Statin.  Her Myoview showed no ischemia or      infarction.  She will continue with risk factor modification.  2. Hypertension - blood pressure is much better and we will continue      with her lisinopril/hydrochlorothiazide.  3. Hyperlipidemia - she will continue on her Crestor.  4.  Gastroesophageal reflux disease.  5. History of polyps.  6. Possible upcoming knee surgery - given her Myoview, she would be at      low risk for surgery and we would allow her to proceed without      further cardiac evaluation.  We would recommend continuing her      present medications at the time of her surgery.  7. History of deep venous thrombosis.   We will see her back in 12 months.     Madolyn Frieze Jens Som, MD, [facility]  Electronically Signed    BSC/MedQ  DD: 02/21/2008  DT: 02/21/2008  Job #: [id number]

## 2011-04-15 NOTE — Assessment & Plan Note (Signed)
[facility]                            CARDIOLOGY OFFICE NOTE   [name], [name]                        MRN:          [id number]  DATE:12/27/2007                            DOB:          [birth year]    [name] is a pleasant 70 year old female who has previously been  followed by [name] for moderate coronary disease.  She had a  cardiac catheterization on January 18, 2004.  At that time she was  found to have mild irregularities in the left main.  There was a 40%  stenosis in the proximal LAD and a distal 30% lesion.  The first  diagonal had a 30-40% proximal lesion.  The ostium of the LAD by  intravascular ultrasound had a luminal area of 3.9 mm squared and  percent stenosis 60%.  She had nonobstructive disease in the left  circumflex and right coronary.  Her LV function was normal.  She  continues to have occasional chest pain.  She notices this predominately  the upper chest and night.  She takes Hydrocodone and it improves.   NOTE:  There are no associated symptoms.  Note she does not have  exertional chest pain nor is she have dyspnea.  There is no history of  presyncope or syncope.   MEDICATIONS:  Include potassium 20 mEq p.o. daily, aspirin 81 mg daily,  Crestor 5 mg daily, multivitamin, doxycycline.  She takes pain  medications as needed.   PHYSICAL EXAM:  Shows a blood pressure 160/90 and pulse is 44.  She  weighs 221 pounds.  HEENT:  Normal.  Neck is supple with no bruits.  Chest is clear.  CARDIOVASCULAR:  Bradycardic rate.  There is a soft 1/6 systolic  ejection murmur left sternal border.  ABDOMEN:  Exam shows no tenderness and there is no bruits noted.  EXTREMITIES:  Show no edema.   Her electrocardiogram shows marked sinus bradycardia rate of 44.  The  axis is normal.  There are nonspecific ST changes.  Is not significantly  changed compared to previous of the heart rates of slightly reduced.   DIAGNOSIS:  1.  Chest pain - her symptoms sound to be noncardiac.  However, she      does have a history of 60% stenosis of the LAD by intravascular      ultrasound.  We will plan to risk stratify with a Myoview.  If it      shows no ischemia, we will continue medical therapy as well as risk      factor modification.  2. Nonobstructive coronary disease - she will continue on aspirin and      we will resume her crest for 5 mg p.o. daily.  3. Hypertension - her blood pressure is elevated.  She states his      systolic typically runs in 150 to 155 range.  We will add      lisinopril hydrochlorothiazide 25/12.5 mg p.o. daily.  I will check      a BMET one weeks follow potassium renal function.  I have asked to  discontinue her potassium.  4. Hyperlipidemia - she will resume her Crestor was described above to      would check lipids and liver in 6 weeks.  We will check a BMET in 1      week as well after the initiation of lisinopril HCT.  5. Gastroesophageal disease.  6. History of polyps.  7. History of arthritis.  8. Recent knee surgery - she will follow up Dr. Shelle Iron concerning this      issue.  9. History of deep venous thrombosis in 1990.  10.Bradycardia - she has had no symptoms including no fatigue or      syncope.  This is not require further treatment this time.     April Frieze Jens Som, MD, [facility]  Electronically Signed    BSC/MedQ  DD: 12/27/2007  DT: 12/27/2007  Job #: [id number]

## 2011-04-18 NOTE — Assessment & Plan Note (Signed)
[facility]                              CARDIOLOGY OFFICE NOTE   NAME:[name], [name]                        MRN:          [id number]  DATE:07/15/2006                            DOB:          [birth year]    This is a very pleasant, 70 year old black female with nonobstructive  coronary disease, peripheral edema, osteoarthritis of left knee and moderate  obesity, systolic ejection murmur at the aortic area with some mitral valve  annular calcium. The patient continues to have some atypical chest pain,  occasional dyspnea on exertion but generally is doing well.  She has not  lost any weight, unfortunately. She is on [employer] 20 daily, aspirin 81  daily, Crestor 5,  Adalat and furosemide 20.   PHYSICAL EXAMINATION:  VITAL SIGNS:  Blood pressure 132/80, pulse 51, sinus  bradycardia.  GENERAL APPEARANCE:  Obese. JVP is not elevated. Carotid pulse normal.  LUNGS:  Clear.  CARDIAC EXAM:  Reveals a 1/6 short systolic ejection murmur at the aortic  area, no diastolic murmur.  EXTREMITIES:  Reveal no edema.   EKG reveals sinus bradycardia, nonspecific T abnormality. Heart rate 51.   IMPRESSION:  Diagnoses as above, atypical chest pain with nonobstructive  coronary disease. I suggest she continue the same therapy. Plan to check a  BNP and will see her back in a year, or any other time p.r.n.                              E. Graceann Congress, MD, [name]    EJL/MedQ  DD:  07/15/2006  DT:  07/15/2006  Job #:  [id number]

## 2011-04-18 NOTE — Op Note (Signed)
[facility]  Patient:    [name], [name]                        MRN: [id number] Proc. Date: 04/05/01 Adm. Date:  [id number] Attending:  Pierce Crane                           Operative Report  PREOPERATIVE DIAGNOSES:  Degenerative joint disease right knee.  Unilateral meniscus tears right knee.  POSTOPERATIVE DIAGNOSES:  Degenerative joint disease right knee.  Unilateral meniscus tears right knee.  PROCEDURE:  Right knee arthroscopy, partial medial lateral meniscectomy, chondroplasty of patella, medial lateral femoral condyle, medial lateral to the plateau.  BRIEF HISTORY AND INDICATION:  A 69 year old with refractory knee pain, following an injury at work.  MRI indicated tricompartmental degenerative changes, medial left meniscus tears.  These were refractory to conservative treatment; therefore, operative intervention is indicated for debridement, partial medial lateral meniscectomies.  Risks and benefits discussed including bleeding, infection, damage to surrounding structures, no change in symptoms, need for repeat procedure in the future, total knee arthroplasty.  DESCRIPTION OF PROCEDURE:  The patient is in supine position.  After the induction of adequate general anesthesia, 1 gram of Kefzol, the right lower extremity was prepped and draped in the usual sterile fashion.  Lateral parapatellar portal and superior parapatellar portal fashioned with #11 blade. Knee cannula atraumatically placed.  Irrigant was utilized to insufflate the joint.  Direct visualization of the medial parapatellar portal was fashioned with a #11 blade after localization with 18 gauge needle, sparing the medial meniscus.  Inspection revealed extensive patellofemoral degenerative changes, grade 3, and grade 4.  There was an old patellofemoral tracking with stiffness noted in the lateral compartment.  Medial compartment there was a grade 3 chondromalacia of the  patella, of the medial femoral condyle and tibial plateau.  The posterior horn of the medial meniscus was very unstable to probe palpation.  Basket rongeur was introduced and utilized to perform the partial medial meniscectomy to a stable base which was then contoured with a shaver. ACL and PCL were searched with degenerative fraying.  The lateral compartment revealed extensive grade 3 and 4 changes, minor meniscal tear which was shaved with the shaver.  Chondroplasties were performed of the femoral condyle and of the tibial plateau to the stable bases.  The patella and femoral sulcus were debrided as well with chondroplasties.   The knee copiously irrigated.  The remaining menisci was stable with probe palpation.  All compartments were examined.  No loose cartilaginous debris was noted.  The femoral condyle and tibial plateaus were thoroughly palpated without evidence of osteochondral defect.  Skin copiously irrigated.  All instrumentation was removed.  Portals were closed with 4-0 nylon simple sutures.  Wound was dressed sterilely.  The patient was awakened without difficulty and transported to the recovery room in satisfactory condition.  The patient tolerated the procedure well without complications. DD:  04/05/01 TD:  04/03/01 Job: [id number]

## 2011-04-18 NOTE — Cardiovascular Report (Signed)
NAME:  April, [name]                           ACCOUNT NO.:  [id number]   MEDICAL RECORD NO.:  [id number]                   PATIENT TYPE:  INP   LOCATION:  2002                                 FACILITY:  [facility]   PHYSICIAN:  Veneda Melter, M.D.                   DATE OF BIRTH:  28-Feb-1941   DATE OF PROCEDURE:  01/18/2004  DATE OF DISCHARGE:                              CARDIAC CATHETERIZATION   PROCEDURES PERFORMED:  1. Left heart catheterization.  2. Left ventriculogram.  3. Selective coronary angiography.  4. Intravascular ultrasound of the left anterior descending.  5. Angio-Seal, right femoral artery.   DIAGNOSES:  1. Coronary atherosclerotic disease.  2. Normal left ventricular systolic function.   CARDIOLOGIST:  Veneda Melter, M.D.   HISTORY:  [name] is a 70 year old black female who presents for  assessment of palpitations and sharp chest discomfort.  The patient had  nonspecific ECG changes in the anterior precordial leads and she is referred  for further cardiac assessment.   TECHNIQUE:  Informed consent was obtained.  The patient was brought to the  catheterization lab.  A 6 French sheath was placed in the right femoral  artery using the modified Seldinger technique.  Six [place] JL-4 and JR-4  catheters were then used to engage the left and right coronary arteries, and  selective coronary angiography performed in various projections using manual  injection of contrast.  Six French pigtail catheter was then advanced in the  left ventricle and a left ventriculogram performed using power injection of  contrast.  Intravascular ultrasound of the LAD was then performed using a 6  [place] JL-4 guide catheter and a 0.014 inch _________ support wire.  IVUS  was performed using automated pullback.  Repeat angiography showed no vessel  damage.   At the termination of the case catheters and sheaths were removed, and an  Angio-Seal closure device deployed to the right femoral  artery until  adequate hemostasis was achieved.   The patient tolerated the procedure well and was transferred to the floor in  stable condition.   FINDINGS:  The findings are as follows.   1. Left Main Trunk:  The left main trunk is a large caliber vessel with mild     irregularities.   1. LAD:  The LAD is a medium caliber vessel that provides two large diagonal     branches in the proximal segment.  The LAD has a hazy ostial narrowing of     40% by diameter.  The distal vessel has mild disease of 30%.  The first     diagonal branch has mild narrowing of 30-40% in the proximal segment.     Intravascular ultrasound shows the distal LAD to be at least 2.5-2.75 mm     in diameter with the mid LAD of 3.0 mm and the proximal LAD to be 3,0-3.3  mm in diameter.  There is some positive remodeling just prior to the     diagonal branches.  There is then moderate eccentric plaque with     calcification.  The ostium of the LAD has a luminal area of 3.9 squared     millimeters and percent stenosis of 60%.   1. Left Circumflex Artery:  The left circumflex is a medium caliber vessel     that provides thee marginal branches.  The left circumflex system has     mild disease of 30%.   1. Right Coronary Artery:  The right coronary artery is dominant.  This is a     medium caliber vessel that provides the posterior descending artery and a     small posterior ventricular branch in its terminal segment.  The right     coronary artery has mild disease of 20% in the proximal and mid section.   1. LV:  Normal end-systolic and end-diastolic dimensions.  Overall left     ventricular function is well-preserved.  Ejection fraction is greater     than 55%.  No mitral regurgitation.  LV pressure is 120/10-Monocryl     aortic is 120/70 and LVEDP equals 25.   ASSESSMENT AND PLAN:  [name] is a 70 year old female with moderate  coronary disease involving the ostium of the left anterior descending.  This   is a borderline lesion for which aggressive medical therapy will be  recommended.  Stress imaging study may be prudent to rule out ischemia in  this territory as a potential cause of her symptoms should they persist.  In  the interim other causes of chest discomfort will be investigated.                                               Veneda Melter, M.D.    NG/MEDQ  D:  01/18/2004  T:  01/19/2004  Job:  [id number]   cc:   Lonzo Cloud. Kriste Basque, M.D. [facility]   Bea Laura Graceann Congress, M.D.

## 2011-04-18 NOTE — Assessment & Plan Note (Signed)
[facility]                             PULMONARY OFFICE NOTE   NAME:[name], [name]                        MRN:          [id number]  DATE:12/31/2006                            DOB:          Mar 29, 1941    HISTORY OF PRESENT ILLNESS:  The patient is a 70 year old African-  American female patient of Dr. Jodelle Green who has a known history of  coronary artery disease, gastroesophageal reflux, and hyperlipidemia,  presents for an acute office visit.  The patient complains over the last  3 days that she has had right-sided lower back pain with radiation down  into the buttocks and hip.  The patient complains when she bends over,  tries to get up from a sitting to standing position and walks, she has  pain.  The patient has been using ibuprofen with some relief and  symptoms are actually improved today.  The patient denies any known  injury, chest pain, shortness of breath, nausea, vomiting, reflux  symptoms, urinary symptoms, or urinary incontinence.   PAST MEDICAL HISTORY:  Reviewed.   CURRENT MEDICATIONS:  Reviewed.   PHYSICAL EXAMINATION:  GENERAL:  The patient is a morbidly-obese female  in no acute distress.  She is afebrile with stable vital signs.  O2  saturation is 98% on room air.  HEENT:  Unremarkable.  NECK:  Supple without adenopathy, no JVD.  LUNG SOUNDS:  Clear to auscultation.  CARDIAC:  Regular rate.  ABDOMEN:  Morbidly obese, soft and nontender.  EXTREMITIES:  Warm without any calf tenderness, cyanosis, clubbing, or  edema.  Equal strength through upper and lower extremities.  Negative  straight leg raises.  The patient has tenderness on the right lumbar  sacral region.  No ecchymosis, erythema, or rashes noted.  Pulses are  intact.  Internal-external rotation of the hip is normal without  reproducible symptoms.   IMPRESSION AND PLAN:  Right lumbosacral strain.  The patient is to  alternate ice and heat, use Advil 600 mg twice daily  over the next 5-7  days.  Skelaxin 800 mg up to three times a day  as needed.  The patient has Darvocet at home as needed for severe pain.  The patient is to return back as scheduled or sooner if needed.     Rubye Oaks, NP  Electronically Signed      Lonzo Cloud. Kriste Basque, MD  Electronically Signed   TP/MedQ  DD: 12/31/2006  DT: 12/31/2006  Job #: [id number]

## 2011-04-18 NOTE — Discharge Summary (Signed)
NAME:  [name], [name]                           ACCOUNT NO.:  [id number]   MEDICAL RECORD NO.:  [id number]                   PATIENT TYPE:  INP   LOCATION:  2002                                 FACILITY:  [facility]   PHYSICIAN:  Charlies Constable, M.D.                  DATE OF BIRTH:  03/07/1941   DATE OF ADMISSION:  01/17/2004  DATE OF DISCHARGE:  01/19/2004                           DISCHARGE SUMMARY - REFERRING   DISCHARGE DIAGNOSES:  1. Moderate distal coronary artery disease.  2. Normal ejection fraction.  3. Hiatal hernia.  4. History of epilepsy.  5. Chronic lower extremity edema with chronic venous insufficiency.  6. Osteoarthritis/cervical spine degenerative disease status post C1     laminectomy, 1993.  7. Status post occipital craniotomy.  8. Status post vocal cord nodule removal.  9. Status post right hand surgery with reflex sympathetic dystrophy     syndrome, with a recent nerve block to the right hand.   HOSPITAL COURSE:  [name] is a 70 year old female with a long-standing  history of palpitations of increasing frequency and intensity.  She noticed  a severe episode two days prior to admission, which lasted a half an hour in  duration.  This was accompanied by chest pressure, nausea, vomiting, and  diaphoresis.  The next morning, she sought care from her primary care  physician.  An EKG revealed sinus bradycardia with a rate of 43 beats per  minute, with T wave inversion in the anterolateral leads.  She was seen in  the cardiology clinic by Dr. Glennon Hamilton on January 17, 2004, and at that  point it was felt that it was pertinent for the patient to be admitted for  cardiac catheterization.  She underwent cardiac catheterization on January 18, 2004, and she was found to have the following:  Left main with mild  disease, LAD with two proximal diagonals.  It did have a 40% ostial lesion,  left circumflex with three OMs.  There was a 30% ostial circumflex lesion.  The right  coronary artery was dominant with a 20% proximal lesion.  ___________ was performed on the LAD, and the vessel was a 3.0-3.3 mm  vessel.  The ostial stenosis appeared to be at 60%.  LV gram revealed an EF  of 55% with no MR.  At this point, Dr. Chales Abrahams, the interventional  cardiologist, felt that the patient should have an outpatient Cardiolite and  then follow up with Dr. Glennon Hamilton.  At that point, if this does reveal  ischemia, we will need to go ahead and perform intervention on this patient.   The patient was discharged to home on January 19, 2004 in stable condition.   LABORATORY STUDIES:  During the patient's hospital stay, included TSH of  1.210, normal cardiac isoenzymes, white count 4.9, hemoglobin 13.4,  hematocrit 40.9, platelets 222, sodium 139, potassium 4.2, BUN 10,  creatinine 0.7.  Lipid panels are pending at this time.   The patient is to be discharged home on a baby aspirin.  She was placed on  Lopressor in the hospital 25 mg b.i.d.  However, bradycardia prohibited the  administration of this drug, so it will not be given to her on discharge.  She will resume her Neurontin and Mobic, and pain medications, as prior to  admission.  No strenuous activity or driving for two days, then gradually  return to normal activity.  Remain on a low-fat diet.  Cleaning of her cath  site with soap and water, no scrubbing.  She is to follow up on January 24, 2004, at 12:30 p.m. for stress test.  She is to eat no heavy food after  midnight.  Liquids will be okay until 8:30 a.m., but no caffeine, and then a  follow-up test will be performed, because this will need to be a two-day  test.  She will then have a follow-up appointment with Dr. Glennon Hamilton on  February 05, 2004, at 9:30 a.m.   At this point, the patient has not been placed on a statin, due to low HDL,  which apparently was 67, with an LDL of 73.      [name], P.A. [facility]                      Charlies Constable, M.D.    LB/MEDQ   D:  01/19/2004  T:  01/20/2004  Job:  [id number]   cc:   Lonzo Cloud. Kriste Basque, M.D. [facility]   Bea Laura Graceann Congress, M.D.

## 2011-05-05 ENCOUNTER — Telehealth: Payer: Self-pay | Admitting: Pulmonary Disease

## 2011-05-05 NOTE — Telephone Encounter (Signed)
Called and spoke with pt and she is aware that SN has the form from Dr. Shelle Iron and once this is completed we will send this back to him.  Pt voiced her understanding

## 2011-05-06 ENCOUNTER — Telehealth: Payer: Self-pay | Admitting: *Deleted

## 2011-05-06 NOTE — Telephone Encounter (Signed)
Called and lmomtcb for pt to make her aware that per SN---she will need ov for surgical clearance--needs cxr, ekg, etc.  i have the form at my desk.  i have an opening in the am at 10:30 and explained in the message for the pt to call back to let us know if she can take this appt.

## 2011-05-06 NOTE — Telephone Encounter (Signed)
[id number]

## 2011-05-07 ENCOUNTER — Encounter: Payer: Self-pay | Admitting: Pulmonary Disease

## 2011-05-07 ENCOUNTER — Other Ambulatory Visit (INDEPENDENT_AMBULATORY_CARE_PROVIDER_SITE_OTHER): Payer: Medicare Other

## 2011-05-07 ENCOUNTER — Ambulatory Visit (INDEPENDENT_AMBULATORY_CARE_PROVIDER_SITE_OTHER): Payer: Medicare Other | Admitting: Pulmonary Disease

## 2011-05-07 VITALS — BP 140/80 | HR 78 | Temp 97.0°F | Ht 65.0 in | Wt 232.0 lb

## 2011-05-07 DIAGNOSIS — E7800 Pure hypercholesterolemia, unspecified: Secondary | ICD-10-CM

## 2011-05-07 DIAGNOSIS — D649 Anemia, unspecified: Secondary | ICD-10-CM

## 2011-05-07 DIAGNOSIS — K219 Gastro-esophageal reflux disease without esophagitis: Secondary | ICD-10-CM

## 2011-05-07 DIAGNOSIS — D126 Benign neoplasm of colon, unspecified: Secondary | ICD-10-CM

## 2011-05-07 DIAGNOSIS — M545 Low back pain, unspecified: Secondary | ICD-10-CM

## 2011-05-07 DIAGNOSIS — E669 Obesity, unspecified: Secondary | ICD-10-CM

## 2011-05-07 DIAGNOSIS — Z01818 Encounter for other preprocedural examination: Secondary | ICD-10-CM

## 2011-05-07 DIAGNOSIS — I10 Essential (primary) hypertension: Secondary | ICD-10-CM

## 2011-05-07 DIAGNOSIS — J4489 Other specified chronic obstructive pulmonary disease: Secondary | ICD-10-CM

## 2011-05-07 DIAGNOSIS — J449 Chronic obstructive pulmonary disease, unspecified: Secondary | ICD-10-CM

## 2011-05-07 DIAGNOSIS — J309 Allergic rhinitis, unspecified: Secondary | ICD-10-CM

## 2011-05-07 DIAGNOSIS — I2510 Atherosclerotic heart disease of native coronary artery without angina pectoris: Secondary | ICD-10-CM

## 2011-05-07 DIAGNOSIS — M1990 Unspecified osteoarthritis, unspecified site: Secondary | ICD-10-CM

## 2011-05-07 DIAGNOSIS — I872 Venous insufficiency (chronic) (peripheral): Secondary | ICD-10-CM

## 2011-05-07 DIAGNOSIS — R7611 Nonspecific reaction to tuberculin skin test without active tuberculosis: Secondary | ICD-10-CM

## 2011-05-07 DIAGNOSIS — G9050 Complex regional pain syndrome I, unspecified: Secondary | ICD-10-CM

## 2011-05-07 LAB — CBC WITH DIFFERENTIAL/PLATELET
Basophils Absolute: 0 10*3/uL (ref 0.0–0.1)
Basophils Relative: 0.7 % (ref 0.0–3.0)
Eosinophils Absolute: 0.1 10*3/uL (ref 0.0–0.7)
Eosinophils Relative: 1.8 % (ref 0.0–5.0)
HCT: 37.4 % (ref 36.0–46.0)
Hemoglobin: 12.3 g/dL (ref 12.0–15.0)
Lymphocytes Relative: 41.9 % (ref 12.0–46.0)
Lymphs Abs: 1.9 10*3/uL (ref 0.7–4.0)
MCHC: 32.9 g/dL (ref 30.0–36.0)
MCV: 83.4 fl (ref 78.0–100.0)
Monocytes Absolute: 0.3 10*3/uL (ref 0.1–1.0)
Monocytes Relative: 6.4 % (ref 3.0–12.0)
Neutro Abs: 2.3 10*3/uL (ref 1.4–7.7)
Neutrophils Relative %: 49.2 % (ref 43.0–77.0)
Platelets: 200 10*3/uL (ref 150.0–400.0)
RBC: 4.49 Mil/uL (ref 3.87–5.11)
RDW: 14.7 % — ABNORMAL HIGH (ref 11.5–14.6)
WBC: 4.6 10*3/uL (ref 4.5–10.5)

## 2011-05-07 LAB — BASIC METABOLIC PANEL
BUN: 11 mg/dL (ref 6–23)
CO2: 27 mEq/L (ref 19–32)
Calcium: 9.3 mg/dL (ref 8.4–10.5)
Chloride: 106 mEq/L (ref 96–112)
Creatinine, Ser: 0.7 mg/dL (ref 0.4–1.2)
GFR: 101.87 mL/min (ref >60.00)
Glucose, Bld: 118 mg/dL — ABNORMAL HIGH (ref 70–99)
Potassium: 4.3 mEq/L (ref 3.5–5.1)
Sodium: 140 mEq/L (ref 135–145)

## 2011-05-07 LAB — IBC PANEL
Iron: 81 ug/dL (ref 42–145)
Saturation Ratios: 16.8 % — ABNORMAL LOW (ref 20.0–50.0)
Transferrin: 345 mg/dL (ref 212.0–360.0)

## 2011-05-07 LAB — HEPATIC FUNCTION PANEL
ALT: 21 U/L (ref 0–35)
AST: 24 U/L (ref 0–37)
Albumin: 3.9 g/dL (ref 3.5–5.2)
Alkaline Phosphatase: 109 U/L (ref 39–117)
Bilirubin, Direct: 0.1 mg/dL (ref 0.0–0.3)
Total Bilirubin: 0.3 mg/dL (ref 0.3–1.2)
Total Protein: 7.3 g/dL (ref 6.0–8.3)

## 2011-05-07 LAB — TSH: TSH: 1.64 u[IU]/mL (ref 0.35–5.50)

## 2011-05-07 LAB — LIPID PANEL
Cholesterol: 143 mg/dL (ref 0–200)
HDL: 61.1 mg/dL (ref >39.00)
LDL Cholesterol: 53 mg/dL (ref 0–99)
Total CHOL/HDL Ratio: 2
Triglycerides: 145 mg/dL (ref 0.0–149.0)
VLDL: 29 mg/dL (ref 0.0–40.0)

## 2011-05-07 NOTE — Patient Instructions (Signed)
Today we updated your med list in EPIC...    Continue your current meds the same...  Today we did your follow up CXR, EKG, &  fasting blood work...    Please call the PHONE TREE in a few days for your results...    Dial [id number] & when prompted enter your patient number followed by the # symbol...    Your patient number is:  [id number]#  We will send notes to drBeane giving him the OK for surg on your knee...  Call for any questions...  Let's plan a follow up visit in 6months, sooner if needed for problems.Marland KitchenMarland Kitchen

## 2011-05-07 NOTE — Progress Notes (Signed)
Subjective:    Patient ID: [name], female    DOB: 10/13/1941, 70 y.o.   MRN: [id number]  HPI 70 y/o BF here for a follow up of her mult med problems including: Allergies, HBP, CAD, Chol, Obesity, etc... she is also followed by DrCrenshaw for Cards, & DrESL for allergies...  ~  December 10, 2009:  she had some CP w/ f/u eval by DrCrenshaw- NEG- see details below... BP under good control, as is the Chol on Cres5mg /d... weight is down 10# but she's had incr difficulty w/ left knee pain & will check w/ DrBeane re: synvisc vs cortisone shot...  ~  June 10, 2010:  she apparently applied for job at [employer] routine PPD was pos- sent to the health dept Tb clinic & CXR was neg> started on INH (rec for 65month course- started 2/11 til 11/11) & they do labs ?monthly... we have not received any records & will request these to be sent to [place] for review (never received)... her breathing has been good- no new complaints; BP controlled on meds;  denies CP/ angina but still too sedentary & weight is UP 4#;  asking for new chol med since Cres5 is "too$$"- we discussed trial PRAVACHOL40... finally she notes DrBeane indicated that she needs left TKR as well but she is holding off...  ~  September 20, 2010:  we still haven't received any records from health dept- they check her monthly & give her the INH & she is due to complete the course 11/11... she denies cough, sputum, hemoptysis, CP, ch in DOB/ DOE... states she quit smoking last night!!!  BP remains controlled on med;  she saw DrCrenshaw 7/11- doing well, no changes made, f/u 20yr;  needs to ret fasting to check FLP on Prav40... OK Flu shot today.  ~  May 07, 2011:  61mo ROV & pre-op clearance for TKR by DrBeane> clinically stable on minimal meds (Prav40, ASA, MVI- all others just prn);  She finished 54mo INH for pos PPD 11/11 & CXR from health dept were apparently OK but we don't have the films or reports;  she denies cough, sputum, hemoptysis, CP, palpit, dizzy,  ch in DOE, edema, etc... BP stable on diet Rx & using the Lasix just as needed for swelling in legs; FLP looks good on Prav40;  Weight is a prob but can't exerc due to DJD; taking Norco prn knee pain from DrBeane... eval here is OK for surg...   Problem List:  ALLERGIC RHINITIS (ICD-477.9) - on NASONEX, ASTEPRO, ZYRTEK, & allergy shots... POSITIVE PPD (ICD-795.5) CHRONIC OBSTRUCTIVE ASTHMA UNSPECIFIED (ICD-493.20) - she had +allergy skin testing for dust mites and started on allergy shots per DrESL along w/ SYMBICORT 160- 2spBid, but she doesn't take anything regularly... +smoker but decreased from 1/2 ppd x 63yrs to 1-2 cig/d. ~  2/11: she reports +PPD on pre-employment testing> sent to health dept & started on INH 300mg /d x34mo. ~  CXR 2/11 showed borderline heart size, tort Ao, sl incr markings, NAD... ~  subseq CXRs at health dept for her pos PPD but we don't have the films or reports...  Hx of HYPERTENSION (ICD-401.9) - off Lisinopril/Hct (Hx ACE reaction) & taking LASIX 20mg  as needed for swelling... BP=150/80 today and denies HA, fatigue, visual changes, CP, palipit, dizziness, syncope, dyspnea, edema, etc... ~  EKG 6/12 showed SBrady w/ rate= 55, otherw WNL, NAD...  CAD (ICD-414.00) - known non-obstructive disease & atypic CP... on ASA 81mg /d, STATIN (Crestor 5), &  off ACE. ~  cath 2/05 showed 20-60% obstructions in all 3 vessels... good LVF... ~  NuclearStressTest 3/09 was negative- no ischemia or infarction, EF=68%... ~  Eval for recurrent CP- EKG showed NSR, NAD;  2DEcho showed mild focal basal septal hypertrophy, norm wall motion w/ EF= 55-65%, mild DD & PAsys= 37;  Myoview NEG- no scar, no ischemia, EF=64%... ~  ROV w/ DrCrenshaw 7/11 reviewed- continue ASA/ Statin/ smoking cessation/ monitor BP & Chol.  VENOUS INSUFFICIENCY (ICD-459.81) - she follows a low salt diet... +LASIX 20mg /d prn for swelling... ~  1/10: we discussed no salt, Lasix20, elevation, support  hose.  HYPERCHOLESTEROLEMIA (ICD-272.0) - on CRESTOR 5mg /d-  ~  FLP 5/08 on Cres5 showed TChol 115, TG 83, HDL 50, LDL 49... tolerating well and taking med regularly. ~  FLP 3/09 on Cres5 showed TChol 126, TG 104, HDL 56, LDL 49... rec- same. ~  FLP 3/10 on Cres5 showed TChol 152, TG 97, HDL 77, LDL 55 ~  FLP 12/10 on Cres5 showed TChol 127, TG 100, HDL 52, LDL 55 ~  7/11:  pt requests change to less expensive statin> try PRAVASTATIN40 ~  FLP 10/11 on Prav40 showed TChol 143, TG 125, HDL 51, LDL 67... Continue same. ~  FLP 6/12 on Prav40 showed TChol 143, TG 145, HDL 61, LDL 53  OBESITY (ICD-278.00) - unable to diet effectively and get weight down...  ~  weight 5/10 = 238#, 5\' 5"  tall,  BMI= 40... we discussed diet and exercise program... again! ~  weight 1/11 = 228# ~  weight 7/11 = 232# ~  weight 10/11 = 230# ~  Weight 6/12 = 232#  GERD (ICD-530.81) - EGD 7/05 by DrStark w/ HH- supposed to be on PPI (Omeprazole 20mg ) & H2 blocker (PEPCID 20mg ) at bedtime... ~  1/10: she stopped above meds & is recommended to restart Rx. ~  1/11:  still not taking acid suppression meds.  COLONIC POLYPS (ICD-211.3) - last colonoscopy was 12/03 showing several 3-62mm polyps (hyperplastic)...  HX OF GALLSTONE (ICD-V12.79) - seen on CT 7/06 and referred to [facility]- eval by DrCornett and being followed...  DEGENERATIVE JOINT DISEASE (ICD-715.90) - followed by DrBeane for ortho... Right knee arthroscopy 1/09 without help... s/p right TKR 06/12/08 by Tora Perches... 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 she is off the Etodolac & takes VICODIN Prn... ~  1/11: now complaining of left knee pain & will f/u w/ DrBeane... ~  6/12:  DrBeane plans left TKR soon... OK for surg...  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   Past Surgical History  Procedure Date  . Abdominal hysterectomy   . Cspine surgery 1993    for arnold-chiari malformation  . Right knee arthroscopy 12/2007    Dr. Shelle Iron  . 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 Shelle Iron    Outpatient Encounter Prescriptions as of 05/07/2011  Medication Sig Dispense Refill  . aspirin 81 MG tablet Take 81 mg by mouth daily.        . Azelastine HCl (ASTEPRO) 0.15 % SOLN Place 2 sprays into the nose daily.        . budesonide-formoterol (SYMBICORT) 160-4.5 MCG/ACT inhaler Inhale 2 puffs into the lungs 2 (two) times daily.        Marland Kitchen  furosemide (LASIX) 20 MG tablet Take 20 mg by mouth daily.        Marland Kitchen HYDROcodone-acetaminophen (NORCO) 5-325 MG per tablet Take 1 tablet by mouth every 6 (six) hours as needed. As needed for pain       . isoniazid (NYDRAZID) 300 MG tablet Take 300 mg by mouth daily.        . mometasone (NASONEX) 50 MCG/ACT nasal spray Place 2 sprays into the nose 2 (two) times daily.        . Multiple Vitamin (MULTIVITAMIN) capsule Take 1 capsule by mouth daily.        . potassium chloride SA (K-DUR,KLOR-CON) 20 MEQ tablet Take 20 mEq by mouth daily.        . pravastatin (PRAVACHOL) 40 MG tablet Take 40 mg by mouth daily. For cholesterol          No Known Allergies   Review of Systems         See HPI - all other systems neg except as noted... The patient complains of dyspnea on exertion.  The patient denies anorexia, fever, weight loss, weight gain, vision loss, decreased hearing, hoarseness, chest pain, syncope, worsening edema, prolonged cough, headaches, hemoptysis, abdominal pain, melena, hematochezia, severe indigestion/heartburn, hematuria, incontinence, muscle weakness, suspicious skin lesions, transient blindness, difficulty walking, depression, unusual weight change, abnormal bleeding, enlarged lymph nodes, and angioedema.     Objective:   Physical Exam      WD, Overweight, 70 y/o BF in NAD... GENERAL:  Alert & oriented; pleasant & cooperative... HEENT:  [place]/AT, EOM-wnl, PERRLA, EACs-clear, TMs-wnl, NOSE-clear, THROAT-clear & wnl, no lesions seen; VOICE- sl hoarse. NECK:  Supple w/ decrROM; no JVD; normal carotid impulses w/o bruits; no thyromegaly or nodules palpated; no lymphadenopathy. CHEST:  Clear to P & A; without wheezes/ rales/ or rhonchi heard... HEART:  Regular Rhythm, gr 1/6 SEM without rubs or gallops... ABDOMEN:  Obese, soft & nontender; normal bowel sounds; no organomegaly or masses detected. EXT:  s/p right TKR, mod arthritic changes; scattered  varicose veins/ +venous insuffic/ no  edema... NEURO:  CN's intact; no focal neuro deficits... DERM:  No lesions noted; no rash etc...   Assessment & Plan:   DJD left knee bone on bone & DrBeane plans TKR soon> OK for surg...  Hx Asthma, ex-smoker, hx pos PPD- treated...  Stable on Symbicort which she uses as needed along w/ her Zyrtek, Astepro, Nasonex for her allergies...  HBP>  BP controlled on diet + prn Lasix Rx...  CAD>  Denies CP, angina, etc;  On ASA, statin, off cigs, etc...  Ven Insuffic>  On sodium restriction, elevation, support hose, prn Lasix...  CHOL>  Well controlled on Prav40...  OBESITY>  Needs better diet & hopefully incr exerc after the knee surg...  GI>  GERD, Polyps, Gallstone> follwed by DrStark & stable on Omep20 but uses prn only...  Other medical issues as noted.Marland KitchenMarland Kitchen

## 2011-05-08 LAB — VITAMIN D 25 HYDROXY (VIT D DEFICIENCY, FRACTURES): Vit D, 25-Hydroxy: 32 ng/mL (ref 30–89)

## 2011-05-09 ENCOUNTER — Encounter: Payer: Self-pay | Admitting: Pulmonary Disease

## 2011-05-28 ENCOUNTER — Other Ambulatory Visit (HOSPITAL_COMMUNITY): Payer: Self-pay | Admitting: Specialist

## 2011-05-28 ENCOUNTER — Other Ambulatory Visit: Payer: Self-pay | Admitting: Specialist

## 2011-05-28 ENCOUNTER — Ambulatory Visit (HOSPITAL_COMMUNITY)
Admission: RE | Admit: 2011-05-28 | Discharge: 2011-05-28 | Disposition: A | Discharge status: Home / Self Care | Payer: Medicare Other | Source: Ambulatory Visit | Attending: Specialist | Admitting: Specialist

## 2011-05-28 ENCOUNTER — Encounter (HOSPITAL_COMMUNITY): Payer: Medicare Other

## 2011-05-28 DIAGNOSIS — Z01818 Encounter for other preprocedural examination: Secondary | ICD-10-CM | POA: Insufficient documentation

## 2011-05-28 DIAGNOSIS — Z01811 Encounter for preprocedural respiratory examination: Secondary | ICD-10-CM | POA: Insufficient documentation

## 2011-05-28 DIAGNOSIS — M1710 Unilateral primary osteoarthritis, unspecified knee: Secondary | ICD-10-CM

## 2011-05-28 DIAGNOSIS — Z79899 Other long term (current) drug therapy: Secondary | ICD-10-CM | POA: Insufficient documentation

## 2011-05-28 DIAGNOSIS — R0602 Shortness of breath: Secondary | ICD-10-CM | POA: Insufficient documentation

## 2011-05-28 DIAGNOSIS — I10 Essential (primary) hypertension: Secondary | ICD-10-CM | POA: Insufficient documentation

## 2011-05-28 DIAGNOSIS — M21869 Other specified acquired deformities of unspecified lower leg: Secondary | ICD-10-CM | POA: Insufficient documentation

## 2011-05-28 DIAGNOSIS — J45909 Unspecified asthma, uncomplicated: Secondary | ICD-10-CM | POA: Insufficient documentation

## 2011-05-28 DIAGNOSIS — M179 Osteoarthritis of knee, unspecified: Secondary | ICD-10-CM

## 2011-05-28 DIAGNOSIS — Z01812 Encounter for preprocedural laboratory examination: Secondary | ICD-10-CM | POA: Insufficient documentation

## 2011-05-28 LAB — URINALYSIS, ROUTINE W REFLEX MICROSCOPIC
Bilirubin Urine: NEGATIVE
Glucose, UA: NEGATIVE mg/dL
Hgb urine dipstick: NEGATIVE
Ketones, ur: NEGATIVE mg/dL
Leukocytes, UA: NEGATIVE
Nitrite: NEGATIVE
Protein, ur: NEGATIVE mg/dL
Specific Gravity, Urine: 1.019 (ref 1.005–1.030)
Urobilinogen, UA: 1 mg/dL (ref 0.0–1.0)
pH: 5.5 (ref 5.0–8.0)

## 2011-05-28 LAB — DIFFERENTIAL
Basophils Absolute: 0 10*3/uL (ref 0.0–0.1)
Basophils Relative: 1 % (ref 0–1)
Eosinophils Absolute: 0.1 10*3/uL (ref 0.0–0.7)
Eosinophils Relative: 2 % (ref 0–5)
Lymphocytes Relative: 36 % (ref 12–46)
Lymphs Abs: 1.5 10*3/uL (ref 0.7–4.0)
Monocytes Absolute: 0.4 10*3/uL (ref 0.1–1.0)
Monocytes Relative: 9 % (ref 3–12)
Neutro Abs: 2.1 10*3/uL (ref 1.7–7.7)
Neutrophils Relative %: 51 % (ref 43–77)

## 2011-05-28 LAB — CBC
HCT: 38.9 % (ref 36.0–46.0)
Hemoglobin: 12 g/dL (ref 12.0–15.0)
MCH: 26.1 pg (ref 26.0–34.0)
MCHC: 30.8 g/dL (ref 30.0–36.0)
MCV: 84.7 fL (ref 78.0–100.0)
Platelets: 212 10*3/uL (ref 150–400)
RBC: 4.59 MIL/uL (ref 3.87–5.11)
RDW: 14.1 % (ref 11.5–15.5)
WBC: 4.1 10*3/uL (ref 4.0–10.5)

## 2011-05-28 LAB — COMPREHENSIVE METABOLIC PANEL
ALT: 16 U/L (ref 0–35)
AST: 19 U/L (ref 0–37)
Albumin: 3.2 g/dL — ABNORMAL LOW (ref 3.5–5.2)
Alkaline Phosphatase: 104 U/L (ref 39–117)
BUN: 9 mg/dL (ref 6–23)
CO2: 29 mEq/L (ref 19–32)
Calcium: 9.6 mg/dL (ref 8.4–10.5)
Chloride: 103 mEq/L (ref 96–112)
Creatinine, Ser: 0.59 mg/dL (ref 0.50–1.10)
GFR calc Af Amer: >60 mL/min (ref >60)
GFR calc non Af Amer: >60 mL/min (ref >60)
Glucose, Bld: 88 mg/dL (ref 70–99)
Potassium: 4.4 mEq/L (ref 3.5–5.1)
Sodium: 141 mEq/L (ref 135–145)
Total Bilirubin: 0.3 mg/dL (ref 0.3–1.2)
Total Protein: 7 g/dL (ref 6.0–8.3)

## 2011-05-28 LAB — SURGICAL PCR SCREEN
MRSA, PCR: NEGATIVE
Staphylococcus aureus: NEGATIVE

## 2011-05-28 LAB — PROTIME-INR
INR: 1.04 (ref 0.00–1.49)
Prothrombin Time: 13.8 seconds (ref 11.6–15.2)

## 2011-05-28 LAB — APTT: aPTT: 32 seconds (ref 24–37)

## 2011-05-28 LAB — TYPE AND SCREEN
ABO/RH(D): A POS
Antibody Screen: NEGATIVE

## 2011-06-05 ENCOUNTER — Inpatient Hospital Stay (HOSPITAL_COMMUNITY)
Admission: RE | Admit: 2011-06-05 | Discharge: 2011-06-08 | DRG: 470 | Disposition: A | Discharge status: Home Health | Payer: Medicare Other | Source: Ambulatory Visit | Attending: Specialist | Admitting: Specialist

## 2011-06-05 ENCOUNTER — Inpatient Hospital Stay (HOSPITAL_COMMUNITY): Payer: Medicare Other

## 2011-06-05 DIAGNOSIS — I10 Essential (primary) hypertension: Secondary | ICD-10-CM | POA: Diagnosis present

## 2011-06-05 DIAGNOSIS — F17200 Nicotine dependence, unspecified, uncomplicated: Secondary | ICD-10-CM | POA: Diagnosis present

## 2011-06-05 DIAGNOSIS — M1710 Unilateral primary osteoarthritis, unspecified knee: Principal | ICD-10-CM | POA: Diagnosis present

## 2011-06-05 DIAGNOSIS — M21869 Other specified acquired deformities of unspecified lower leg: Secondary | ICD-10-CM | POA: Diagnosis present

## 2011-06-05 DIAGNOSIS — Z01812 Encounter for preprocedural laboratory examination: Secondary | ICD-10-CM

## 2011-06-05 HISTORY — PX: JOINT REPLACEMENT: SHX530

## 2011-06-06 LAB — BASIC METABOLIC PANEL
BUN: 8 mg/dL (ref 6–23)
CO2: 26 mEq/L (ref 19–32)
Calcium: 8.6 mg/dL (ref 8.4–10.5)
Chloride: 101 mEq/L (ref 96–112)
Creatinine, Ser: 0.47 mg/dL — ABNORMAL LOW (ref 0.50–1.10)
GFR calc Af Amer: >60 mL/min (ref >60)
GFR calc non Af Amer: >60 mL/min (ref >60)
Glucose, Bld: 118 mg/dL — ABNORMAL HIGH (ref 70–99)
Potassium: 3.6 mEq/L (ref 3.5–5.1)
Sodium: 137 mEq/L (ref 135–145)

## 2011-06-06 LAB — CBC
HCT: 35.3 % — ABNORMAL LOW (ref 36.0–46.0)
Hemoglobin: 11.1 g/dL — ABNORMAL LOW (ref 12.0–15.0)
MCH: 26.4 pg (ref 26.0–34.0)
MCHC: 31.4 g/dL (ref 30.0–36.0)
MCV: 84 fL (ref 78.0–100.0)
Platelets: 213 10*3/uL (ref 150–400)
RBC: 4.2 MIL/uL (ref 3.87–5.11)
RDW: 13.9 % (ref 11.5–15.5)
WBC: 7.2 10*3/uL (ref 4.0–10.5)

## 2011-06-07 LAB — BASIC METABOLIC PANEL
BUN: 9 mg/dL (ref 6–23)
CO2: 30 mEq/L (ref 19–32)
Calcium: 8.3 mg/dL — ABNORMAL LOW (ref 8.4–10.5)
Chloride: 98 mEq/L (ref 96–112)
Creatinine, Ser: 0.48 mg/dL — ABNORMAL LOW (ref 0.50–1.10)
GFR calc Af Amer: >60 mL/min (ref >60)
GFR calc non Af Amer: >60 mL/min (ref >60)
Glucose, Bld: 104 mg/dL — ABNORMAL HIGH (ref 70–99)
Potassium: 3.5 mEq/L (ref 3.5–5.1)
Sodium: 135 mEq/L (ref 135–145)

## 2011-06-07 LAB — CBC
HCT: 33.8 % — ABNORMAL LOW (ref 36.0–46.0)
Hemoglobin: 10.9 g/dL — ABNORMAL LOW (ref 12.0–15.0)
MCH: 27 pg (ref 26.0–34.0)
MCHC: 32.2 g/dL (ref 30.0–36.0)
MCV: 83.9 fL (ref 78.0–100.0)
Platelets: 134 10*3/uL — ABNORMAL LOW (ref 150–400)
RBC: 4.03 MIL/uL (ref 3.87–5.11)
RDW: 14.1 % (ref 11.5–15.5)
WBC: 8.5 10*3/uL (ref 4.0–10.5)

## 2011-06-08 LAB — BASIC METABOLIC PANEL
BUN: 8 mg/dL (ref 6–23)
CO2: 30 mEq/L (ref 19–32)
Calcium: 8.3 mg/dL — ABNORMAL LOW (ref 8.4–10.5)
Chloride: 99 mEq/L (ref 96–112)
Creatinine, Ser: <0.47 mg/dL — ABNORMAL LOW (ref 0.50–1.10)
Glucose, Bld: 129 mg/dL — ABNORMAL HIGH (ref 70–99)
Potassium: 3.6 mEq/L (ref 3.5–5.1)
Sodium: 135 mEq/L (ref 135–145)

## 2011-06-08 LAB — CBC
HCT: 29.9 % — ABNORMAL LOW (ref 36.0–46.0)
Hemoglobin: 9.4 g/dL — ABNORMAL LOW (ref 12.0–15.0)
MCH: 26.8 pg (ref 26.0–34.0)
MCHC: 31.4 g/dL (ref 30.0–36.0)
MCV: 85.2 fL (ref 78.0–100.0)
Platelets: 163 10*3/uL (ref 150–400)
RBC: 3.51 MIL/uL — ABNORMAL LOW (ref 3.87–5.11)
RDW: 13.9 % (ref 11.5–15.5)
WBC: 6.2 10*3/uL (ref 4.0–10.5)

## 2011-06-09 NOTE — Op Note (Signed)
[name], THIELKE NO.:  [id number]  MEDICAL RECORD NO.:  [id number]  LOCATION:  0002                         FACILITY:  [facility]  PHYSICIAN:  Jene Every, M.D.    DATE OF BIRTH:  Mar 06, 1941  DATE OF PROCEDURE:  06/05/2011 DATE OF DISCHARGE:                              OPERATIVE REPORT   PREOPERATIVE DIAGNOSIS:  Degenerative joint disease with severe varus deformity of the left knee.  POSTOPERATIVE DIAGNOSIS:  Degenerative joint disease with severe varus deformity of the left knee.  PROCEDURE:  Left total knee arthroplasty.  ANESTHESIA:  General.  ASSISTANT:  Roma Schanz, P.A.  We had difficulty increasing the base and severe deformity, multiple cystic changes of the femoral condyle and tibial plateau.  COMPONENTS UTILIZED:  DePuy rotating platform, 4 femur, 3 tibia, 17.5 mm insert, 38 patella.  BRIEF HISTORY:  A 70, end-stage osteoarthrosis, varus deformity, chronic.  The patient's knee giving way, locking and popping.  End-stage osteoarthrosis of medial compartment, indicated for replacement.  Risks and benefits discussed including bleeding, infection, suboptimal range of motion, DVT, PE, damage to neurovascular structures, need for revision, anesthetic complications, etc.  TECHNIQUE:  The patient in supine position.  After induction of adequate general anesthesia, 2 g of Kefzol, left lower extremity was prepped and draped and exsanguinated in usual sterile fashion.  Thigh tourniquet inflated to 300 mmHg.  Prior to that exam under anesthesia, revealed significant flexion contracture 20 degrees.  She also had some laxity laterally.  Midline incision was made over the patella.  Full-thickness flaps were developed.  Medial parapatellar arthrotomy performed. Patella everted, knee was flexed after we released the soft tissues medially preserving the collateral ligament.  Removed the medial and lateral menisci remnants, she had tricompartmental  osteoarthrosis and significant severe eburnation of the medial compartment and osteophytes. Defect was prominent medially.  There was severe arthrosis of the patella noted as well.  We used a rongeur and removed osteophytes.  Step drill was utilized to enter the femoral canal, irrigated, and a T- handled reamer was placed, drilled 5 degrees, left intramedullary rod was placed, 12 was selected off the distal femur due to flexion contracture and we removed 12 mm off the distal femur protecting the soft tissues at all times.  After removing the distal femur, it was fairly tight, we had it turn towards the tibia before sizing the femur. We subluxed the tibia, cauterizing the geniculate vessels using an external alignment guide.  There were multiple cysts seen in the femur and the tibia medially.  We measured multiple times off the medial tibial plateau and laterally.  First 4 off the medial tibial plateau and greater than 10 off the lateral side.  We then used 2 just to become plus with the defect, it was approximately 10 from the high side which was laterally.  This was pinned after the external alignment guide was utilized bisecting the ankle parallel to the shaft of the tibia.  After this was pinned, soft tissues were protected posteriorly with Crego, we used an oscillating saw to remove the proximal tibia.  Cut was flushed medially.  There were multiple cystic changes of the tibial plateau.  Medially, we used a curette to curette these out; also on the distal femur.  Next, attention was turned back towards the femur.  We sized the femur off the femoral condyles and the anterior cortex of the femur to a 4, this was in external rotation coplanar with the epicondylar access.  The tendon and then cut anteriorly and posteriorly and chamfer cuts after we put the distal femoral cutting jig.  Just prior to this, we measured flexion and extension gap, they were equivalent, completed the  chamfer cuts and protecting soft tissues posteriorly, medially, and laterally throughout the case.  Then we turned attention back towards the tibia. We used a baseplate.  It was certainly small tibia.  We used a 3 which was to the contralateral side just at the medial aspect of the tubercle insertion.  We maximized our coverage, pinned it, central drill hole punch guide.  We then turned our attention back towards the femur, made a box cut bisecting the notch, removed this, placed a trial femur which was a 4.  We first started out with a 10 mm insert.  We had full extension but laxity laterally.  We then trialed a 12 and 15, the 15 was better, with some slight laxity laterally and full extension at 2 mm to 3 mm with varus-valgus stressing.  Patella was then prepared, everted, measured and 22 selected 15 and planned 7 off the patella and measured a 38.  We then clamped the patella and placed our drill holes medializing the patella.  Trial patella was then placed with good tracking of the patella in flexion/extension.  I then removed all of our instrumentation and checked posteriorly.  There was no further soft tissue that was to be removed.  Next, pulsatile lavage was used in the joint.  All surfaces were then dried and flexed.  We mixed cement at the back table in appropriate fashion and after surfaces were dried, knee flexed, injected cement into the proximal tibia digitally pressurizing this also cemented into the cyst, we also placed drill holes at the bottom of the cyst and then impacted the tibial tray.  Redundant cement removed, dried the femur, impacted the femoral component cemented on posterior runners. Redundant cement removed.  We placed a 15 insert, held in reduction and extension with axial load applied.  Redundant cement removed from patella as well.  After the appropriate curing of the cement, we then examined the flexion and extension.  Slight laxity laterally, we tried the  17.5, which was then appropriate laterally, less than 2 mm lift off the full extension and the varus thrust.  Up at full extension with the patellar ligament clamped, there was no anterior drawer.  Good flexion to 140, extension to 0.  Then removed the trials.  Inspected the joint and removed all redundant cement, copiously irrigated the wound. Pulsatile lavage, applied pressure, cauterized the geniculate vessels. Removed any redundant cement, placed a 17.5 permanent insert, again reduced.  Good stability with varus and valgus stressing at 0 and 30 degrees full flexion.  Good patellofemoral tracking.  We then copiously irrigated the wound, placed Hemovac, brought out through a lateral stab wound on the skin.  Repaired the patellar arthrotomy with #1 Vicryl figure-of-8 sutures, subcu with 2-0 Vicryl, skin was reapproximated with staples.  Wound was dressed sterilely.  Flexion against gravity 90 degrees.  Good patellofemoral tracking and stability.  The tourniquet was deflated with adequate vascularization of lower extremities appreciated.  The patient tolerated the procedure  well.  No complications.  Tourniquet time was 1 hour and 16 minutes.     Jene Every, M.D.     Cordelia Pen  D:  06/05/2011  T:  06/05/2011  Job:  [id number]  Electronically Signed by Jene Every M.D. on 06/09/2011 03:49:49 PM

## 2011-06-13 ENCOUNTER — Encounter: Payer: Self-pay | Admitting: Pulmonary Disease

## 2011-06-16 ENCOUNTER — Inpatient Hospital Stay (HOSPITAL_COMMUNITY)
Admission: AD | Admit: 2011-06-16 | Discharge: 2011-06-20 | DRG: 556 | Disposition: A | Discharge status: Skilled Nursing Facility | Payer: Medicare Other | Source: Ambulatory Visit | Attending: Specialist | Admitting: Specialist

## 2011-06-16 DIAGNOSIS — E559 Vitamin D deficiency, unspecified: Secondary | ICD-10-CM | POA: Diagnosis present

## 2011-06-16 DIAGNOSIS — Z7901 Long term (current) use of anticoagulants: Secondary | ICD-10-CM

## 2011-06-16 DIAGNOSIS — E669 Obesity, unspecified: Secondary | ICD-10-CM | POA: Diagnosis present

## 2011-06-16 DIAGNOSIS — K219 Gastro-esophageal reflux disease without esophagitis: Secondary | ICD-10-CM | POA: Diagnosis present

## 2011-06-16 DIAGNOSIS — I10 Essential (primary) hypertension: Secondary | ICD-10-CM | POA: Diagnosis present

## 2011-06-16 DIAGNOSIS — J45909 Unspecified asthma, uncomplicated: Secondary | ICD-10-CM | POA: Diagnosis present

## 2011-06-16 DIAGNOSIS — D6489 Other specified anemias: Secondary | ICD-10-CM | POA: Diagnosis present

## 2011-06-16 DIAGNOSIS — T45515A Adverse effect of anticoagulants, initial encounter: Secondary | ICD-10-CM | POA: Diagnosis present

## 2011-06-16 DIAGNOSIS — M7981 Nontraumatic hematoma of soft tissue: Principal | ICD-10-CM | POA: Diagnosis present

## 2011-06-16 DIAGNOSIS — M79609 Pain in unspecified limb: Secondary | ICD-10-CM | POA: Diagnosis present

## 2011-06-16 DIAGNOSIS — Z96659 Presence of unspecified artificial knee joint: Secondary | ICD-10-CM

## 2011-06-16 LAB — BASIC METABOLIC PANEL
BUN: 10 mg/dL (ref 6–23)
CO2: 30 mEq/L (ref 19–32)
Calcium: 9.7 mg/dL (ref 8.4–10.5)
Chloride: 98 mEq/L (ref 96–112)
Creatinine, Ser: 0.66 mg/dL (ref 0.50–1.10)
GFR calc Af Amer: >60 mL/min (ref >60)
GFR calc non Af Amer: >60 mL/min (ref >60)
Glucose, Bld: 83 mg/dL (ref 70–99)
Potassium: 4.1 mEq/L (ref 3.5–5.1)
Sodium: 136 mEq/L (ref 135–145)

## 2011-06-16 LAB — CBC
HCT: 31.9 % — ABNORMAL LOW (ref 36.0–46.0)
Hemoglobin: 10.1 g/dL — ABNORMAL LOW (ref 12.0–15.0)
MCH: 26.5 pg (ref 26.0–34.0)
MCHC: 31.7 g/dL (ref 30.0–36.0)
MCV: 83.7 fL (ref 78.0–100.0)
Platelets: 337 10*3/uL (ref 150–400)
RBC: 3.81 MIL/uL — ABNORMAL LOW (ref 3.87–5.11)
RDW: 14 % (ref 11.5–15.5)
WBC: 6.7 10*3/uL (ref 4.0–10.5)

## 2011-06-16 LAB — APTT: aPTT: 38 seconds — ABNORMAL HIGH (ref 24–37)

## 2011-06-16 LAB — PROTIME-INR
INR: 1.14 (ref 0.00–1.49)
Prothrombin Time: 14.8 seconds (ref 11.6–15.2)

## 2011-06-16 LAB — SEDIMENTATION RATE: Sed Rate: 10 mm/hr (ref 0–22)

## 2011-06-20 NOTE — H&P (Signed)
[name], MEADOR NO.:  [id number]  MEDICAL RECORD NO.:  [id number]  LOCATION:                                 FACILITY:  PHYSICIAN:  Jene Every, M.D.    DATE OF BIRTH:  10-04-1941  DATE OF ADMISSION:  06/05/2011 DATE OF DISCHARGE:                             HISTORY & PHYSICAL   CHIEF COMPLAINT:  Left knee pain.  HISTORY:  The patient is well-known patient to our practice.  She has noted disabling left knee pain for quite some time.  It has progressively gotten worse.  She has undergone conservative treatment including cortisone as well as Euflexxa series with only short-term relief of symptoms.  She follows up with [place] in the spring of this year with persistent pain with now locking, giving away.  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  It is felt at this time the patient will benefit from a total knee arthroplasty. Risks and benefits of this were discussed with the patient.  She does elect to proceed.  We did obtain a medical clearance.  MEDICAL HISTORY:  Significant for, 1. Asthma. 2. Hyperlipidemia. 3. Hypertension. 4. GERD. 5. Vitamin D deficiency. 6. Chronic anemia. 7. Obesity.  CURRENT MEDICATIONS:  Include aspirin, Symbicort, Lasix, Norco, Nasonex, multivitamin, Klor-Con, Pravachol.  ALLERGIES:  None listed.  PAST SURGICAL HISTORY: 1. Hysterectomy. 2. Cervical surgery for Arnold-Chiari malformation in 1993. 3. Right knee arthroscopy in 2009. 4. Right total knee in July 2009.  SOCIAL HISTORY:  The patient is a widow.  She does occasionally smoke tobacco.  Negative for alcohol.  She plans to go home following surgery.  PRIMARY CARE PHYSICIAN:  Lonzo Cloud. Kriste Basque, MD.  FAMILY HISTORY:  Father is living at 75.  Mother is deceased at age 25 from coronary artery disease.  REVIEW OF SYSTEMS:  GENERAL:  The patient denies any fever, chills, night sweats or bleeding tendencies.  CNS:  No blurred or double  vision, seizure, headache or paralysis.  RESPIRATORY:  The patient does note dyspnea on exertion, which is unchanged.  No coughing or wheezing.  GU: No dysuria, hematuria or discharge.  GI:  No nausea, vomiting, diarrhea, constipation, melena.  MUSCULOSKELETAL:  As per HPI.  PHYSICAL EXAMINATION:  VITAL SIGNS:  Pulse 78, BP 120/90, weight 220, height 5 feet 5 inches. GENERAL:  In general, this is an overweight female seen upright, in no distress.  However, she does walk with antalgic gait utilizing a walker. She is slightly short of breath with ambulation. HEENT:  Atraumatic, normocephalic.  Pupils equal, round and reactive to light.  EOMs intact. NECK:  Supple.  No lymphadenopathy. CHEST:  Clear to auscultation bilaterally.  No rhonchi, wheezes, or rales. HEART:  Regular rate and rhythm. ABDOMEN:  Protuberant.  Bowel sounds x4. SKIN:  No rashes or lesions are noted. EXTREMITIES:  Regarding extremities, she has a positive effusion on the left.  She is tender to palpation on medial joint line.  She does have varus stress while walking.  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  PLAN:  The patient will be admitted to undergo a left total knee  arthroplasty.     Roma Schanz, P.A.   ______________________________ Jene Every, M.D.    CS/MEDQ  D:  05/30/2011  T:  05/30/2011  Job:  [id number]  Electronically Signed by Roma Schanz P.A. on 06/16/2011 10:06:56 AM Electronically Signed by Jene Every M.D. on 06/20/2011 03:14:16 PM

## 2011-06-29 NOTE — Discharge Summary (Signed)
April [name], [name] NO.:  [id number]  MEDICAL RECORD NO.:  [id number]  LOCATION:  1605                         FACILITY:  [facility]  PHYSICIAN:  Jene Every, M.D.    DATE OF BIRTH:  1941-02-23  DATE OF ADMISSION:  06/16/2011 DATE OF DISCHARGE:  06/19/2011                              DISCHARGE SUMMARY   ADMISSION DIAGNOSES: 1. Status post left total knee arthroplasty with possible bleed into     the calf with significant lower extremity pain and swelling. 2. Asthma. 3. Hyperlipidemia. 4. Hypertension. 5. Gastroesophageal reflux disease. 6. Vitamin D deficiency. 7. Chronic anemia. 8. Obesity.  DISCHARGE DIAGNOSES: 1. Status post left total knee arthroplasty with possible bleed into     the calf with significant lower extremity pain and swelling.     Status post improvement in lower extremity swelling. 2. Asthma. 3. Hyperlipidemia. 4. Hypertension. 5. Gastroesophageal reflux disease. 6. Vitamin D deficiency. 7. Chronic anemia. 8. Obesity.  HISTORY:  April [name] is a well-known patient to our practice.  She is 11 days status post total knee arthroplasty.  Approximately 2 to 3 days ago, she developed significant pain and swelling into the calf.  She has been at home alone, having to do increased ADL.  She was evaluated in the office, had significant ecchymosis down in to the lower extremity with severe pain, was ambulating.  Doppler was obtained, which showed negative for DVT.  She had good deep pulses.  It was felt she did have a slight bleed into her calf from Xarelto.  Secondary to her home status, we felt her to be appropriate for admission.  Strict bedrest for edema control and will discontinue the Xarelto and then possible admission to rehab.  PROCEDURES:  None.  CONSULTS:  PT/OT and care management.  LABORATORY DATA:  Admission CBC showed a white cell count of 6.7, hemoglobin 10.1, hematocrit 31.9.  PT of 14.8, INR of 1.14, PTT of  38. Routine chemistries were obtained and at admission showed sodium 136, potassium 4.1 with a normal BUN and creatinine.  X-rays of the knee showed good placement of hardware.  Again, ultrasound was obtained prior to admission, which was negative for DVT.  HOSPITAL COURSE:  The patient was admitted to the orthopedic floor for strict bed rest and edema control.  Admission day #1, the patient was doing significantly better.  She noted decrease swelling and decreased pain.  Pain was fairly well controlled on p.o. analgesics.  PT/OT was consulted for gentle range of motion.  Over the course of next couple days, the patient continued to note significant improvement in her symptoms.  Again, pain was significantly controlled with decreased edema.  Dr. Shelle Iron did place the patient on Lovenox 30 mg subcu daily for DVT prophylaxis.  Admission day #3, I felt that the patient would be stable if we discharge to a skilled nursing facility of choice.  She is to follow up with Dr. Shelle Iron in approximately 2 weeks for repeat exam.  ACTIVITY:  She can weight bear as tolerated.  DISCHARGE INSTRUCTIONS:  Would recommend at least 6 times a day for 20 minutes at a time straight elevation of the  lower extremity for edema control and can resume all normal total knee protocol, wound care. Staples were removed today in the hospital.  Steri-Strips were applied and that she keep this area clean and dry.  She does have an area of blistering along the calf, currently Tegaderm is in place that can be changed daily until this was completely dry.  Diet is heart-healthy. Discharge medications as per med rec sheet.  She will continue to be on Lovenox 30 mg subcu daily for 10 additional days and this can be discontinued and she can resume her aspirin.  CONDITION ON DISCHARGE:  Stable.  FINAL DIAGNOSIS:  Doing well, status post re-admission following total knee for edema control.     Roma Schanz,  P.A.   ______________________________ Jene Every, M.D.    CS/MEDQ  D:  06/19/2011  T:  06/19/2011  Job:  [id number]  Electronically Signed by Roma Schanz P.A. on 06/26/2011 02:59:54 PM Electronically Signed by Jene Every M.D. on 06/29/2011 09:49:58 AM

## 2011-06-29 NOTE — Discharge Summary (Signed)
[name], [name] NO.:  [id number]  MEDICAL RECORD NO.:  [id number]  LOCATION:                                 FACILITY:  PHYSICIAN:  [name], M.D.         DATE OF BIRTH:  DATE OF ADMISSION:  06/05/2011 DATE OF DISCHARGE:  06/08/2011                              DISCHARGE SUMMARY   ADMISSION DIAGNOSES: 1. Degenerative joint disease, left knee. 2. Asthma. 3. Hyperlipidemia. 4. Hypertension. 5. Gastroesophageal reflux disease. 6. Vitamin D deficiency. 7. Chronic anemia. 8. Obesity.  DISCHARGE DIAGNOSES: 1. Degenerative joint disease, left knee, status post left total knee     arthroplasty. 2. Asthma. 3. Hyperlipidemia. 4. Hypertension. 5. Gastroesophageal reflux disease. 6. Vitamin D deficiency. 7. Chronic anemia. 8. Obesity.  HISTORY:  The patient is a well-known patient to our practice.  She has noted disabling left knee pain for quite some time.  She has undergone conservative treatment including cortisone injection and Euflexxa series with only short-term relief of her symptoms and on followup earlier this year, she noted mechanical-type knee pain.  Radiographs did reveal end- stage osteoarthritis with a varus deformity.  It was felt at this time the patient will benefit from a total knee arthroplasty.  The risks and benefits of this were discussed with the patient and she does wish to proceed.  PROCEDURE:  The patient was taken to the OR and underwent left total knee arthroplasty.  Surgeon, Dr. Jene [name].  Assistant, April Schanz, PA-C.  Anesthesia, general.  Complications, none.  CONSULTS:  PT, OT, Care Management.  LABORATORY DATA:  Postoperative labs showed white cell count 7.2, hemoglobin 11.1, hematocrit 35.3.  This was monitored throughout the hospital stay.  White cell count remained within normal range, at time of discharge 6.2.  Hemoglobin stable at 9.4, hematocrit 29.9.  Routine chemistries obtained  postoperatively showed sodium 137, potassium 3.6, BUN of 8, creatinine 0.47, glucose of 118.  At the time of discharge, sodium and potassium were stable.  Glucose slightly elevated at 129. GFR was greater than 60.  Do not see a preoperative chest x-ray scanned in the chart.  HOSPITAL COURSE:  The patient was admitted, taken to the OR and underwent the above-stated procedure without difficulty.  One Hemovac drain was placed intraoperatively.  She was then transferred to PACU and then to the orthopedic floor for continued postoperative care.  Postop day #1, the patient was doing well.  Pain was well controlled using PC analgesia.  She denied chest pain, shortness of breath.  Vital signs were stable.  She was afebrile.  Dressing was clean, dry, and intact. PT, OT was consulted.  Xarelto was started per pharmacy.  We did wean the patient from PCA.  Hemovac was discontinued.  The patient did well over the course of next couple of days.  Dressing was changed on postop day #2.  Incision was clean, dry and intact.  Motor and neurovascularfunction was intact.  Postop day #3, the patient met all goals.  It was felt at this time she was stable to be discharged home.  DISPOSITION:  The patient discharged home with all home health therapy  needs met.  She is to follow up with Dr. Shelle Iron in approximately 10 to 14 days for suture removal and x-ray.  ACTIVITY:  She is to ambulate as tolerated.  Elevate the leg at least 6 times a day for 20 minutes at a time.  Use knee immobilizer until she can straight leg raise x10, then she can discontinue that.  MEDICATIONS:  As per med rec sheet.  DIET:  As tolerated.  CONDITION ON DISCHARGE:  Stable.  FINAL DIAGNOSIS:  Doing well status post left total knee arthroplasty.     [name], P.A.   ______________________________ [name], M.D.    CS/MEDQ  D:  06/16/2011  T:  06/16/2011  Job:  [id number]  Electronically Signed by [name] P.A. on 06/26/2011 02:59:45 PM Electronically Signed by [name] M.D. on 06/29/2011 09:49:56 AM

## 2011-07-15 ENCOUNTER — Other Ambulatory Visit (INDEPENDENT_AMBULATORY_CARE_PROVIDER_SITE_OTHER): Payer: Medicare Other

## 2011-07-15 ENCOUNTER — Encounter: Payer: Self-pay | Admitting: Pulmonary Disease

## 2011-07-15 ENCOUNTER — Ambulatory Visit (INDEPENDENT_AMBULATORY_CARE_PROVIDER_SITE_OTHER): Payer: Medicare Other | Admitting: Pulmonary Disease

## 2011-07-15 DIAGNOSIS — I10 Essential (primary) hypertension: Secondary | ICD-10-CM

## 2011-07-15 DIAGNOSIS — S8010XA Contusion of unspecified lower leg, initial encounter: Secondary | ICD-10-CM

## 2011-07-15 DIAGNOSIS — I872 Venous insufficiency (chronic) (peripheral): Secondary | ICD-10-CM

## 2011-07-15 DIAGNOSIS — J449 Chronic obstructive pulmonary disease, unspecified: Secondary | ICD-10-CM

## 2011-07-15 DIAGNOSIS — D62 Acute posthemorrhagic anemia: Secondary | ICD-10-CM

## 2011-07-15 DIAGNOSIS — I2510 Atherosclerotic heart disease of native coronary artery without angina pectoris: Secondary | ICD-10-CM

## 2011-07-15 DIAGNOSIS — E7800 Pure hypercholesterolemia, unspecified: Secondary | ICD-10-CM

## 2011-07-15 DIAGNOSIS — Z96659 Presence of unspecified artificial knee joint: Secondary | ICD-10-CM

## 2011-07-15 DIAGNOSIS — E669 Obesity, unspecified: Secondary | ICD-10-CM

## 2011-07-15 DIAGNOSIS — M1990 Unspecified osteoarthritis, unspecified site: Secondary | ICD-10-CM

## 2011-07-15 LAB — CBC WITH DIFFERENTIAL/PLATELET
Basophils Absolute: 0 10*3/uL (ref 0.0–0.1)
Basophils Relative: 0.6 % (ref 0.0–3.0)
Eosinophils Absolute: 0.1 10*3/uL (ref 0.0–0.7)
Eosinophils Relative: 2.1 % (ref 0.0–5.0)
HCT: 34.3 % — ABNORMAL LOW (ref 36.0–46.0)
Hemoglobin: 11 g/dL — ABNORMAL LOW (ref 12.0–15.0)
Lymphocytes Relative: 43.5 % (ref 12.0–46.0)
Lymphs Abs: 2 10*3/uL (ref 0.7–4.0)
MCHC: 32 g/dL (ref 30.0–36.0)
MCV: 81.2 fl (ref 78.0–100.0)
Monocytes Absolute: 0.5 10*3/uL (ref 0.1–1.0)
Monocytes Relative: 10 % (ref 3.0–12.0)
Neutro Abs: 2 10*3/uL (ref 1.4–7.7)
Neutrophils Relative %: 43.8 % (ref 43.0–77.0)
Platelets: 253 10*3/uL (ref 150.0–400.0)
RBC: 4.23 Mil/uL (ref 3.87–5.11)
RDW: 14.4 % (ref 11.5–14.6)
WBC: 4.7 10*3/uL (ref 4.5–10.5)

## 2011-07-15 LAB — BASIC METABOLIC PANEL
BUN: 14 mg/dL (ref 6–23)
CO2: 32 mEq/L (ref 19–32)
Calcium: 9.2 mg/dL (ref 8.4–10.5)
Chloride: 104 mEq/L (ref 96–112)
Creatinine, Ser: 0.9 mg/dL (ref 0.4–1.2)
GFR: 76.05 mL/min (ref >60.00)
Glucose, Bld: 102 mg/dL — ABNORMAL HIGH (ref 70–99)
Potassium: 5 mEq/L (ref 3.5–5.1)
Sodium: 142 mEq/L (ref 135–145)

## 2011-07-15 MED ORDER — PRAVASTATIN SODIUM 40 MG PO TABS: 40.0000 mg | ORAL_TABLET | Freq: Every day | ORAL | Status: DC

## 2011-07-15 NOTE — Patient Instructions (Signed)
Today we updated your med list in EPIC & we reconciled your lists...  For the leg swelling:    Use a heating pad as needed...    Keep your legs elevated... And try an ACE wrap when up and about...    Remember: NO SALT...    Continue the Lasix daily...  Today we did your follow up blood work...    Please call the [facility]REE in a few days for your results...    Dial [id number] & when prompted enter your patient number followed by the # symbol...    Your patient number is:   [phone]  Call for any problems...  Let's plan a recheck in 3-4 weeks.Marland KitchenMarland Kitchen

## 2011-07-15 NOTE — Progress Notes (Signed)
Subjective:    Patient ID: [name], female    DOB: 11-17-1941, 70 y.o.   MRN: [id number]  HPI 70 y/o BF here for a follow up of her mult med problems including: Allergies, HBP, CAD, Chol, Obesity, etc... she is also followed by DrCrenshaw for Cards, & DrESL for allergies...  ~  December 10, 2009:  she had some CP w/ f/u eval by DrCrenshaw- NEG- see details below... BP under good control, as is the Chol on Cres5mg /d... weight is down 10# but she's had incr difficulty w/ left knee pain & will check w/ DrBeane re: synvisc vs cortisone shot...  ~  June 10, 2010:  she apparently applied for job at [employer] routine PPD was pos- sent to the health dept Tb clinic & CXR was neg> started on INH (rec for 94month course- started 2/11 til 11/11) & they do labs ?monthly... we have not received any records & will request these to be sent to [place] for review (never received)... her breathing has been good- no new complaints; BP controlled on meds;  denies CP/ angina but still too sedentary & weight is UP 4#;  asking for new chol med since Cres5 is "too$$"- we discussed trial PRAVACHOL40... finally she notes DrBeane indicated that she needs left TKR as well but she is holding off...  ~  September 20, 2010:  we still haven't received any records from health dept- they check her monthly & give her the INH & she is due to complete the course 11/11... she denies cough, sputum, hemoptysis, CP, ch in DOB/ DOE... states she quit smoking last night!!!  BP remains controlled on med;  she saw DrCrenshaw 7/11- doing well, no changes made, f/u 30yr;  needs to ret fasting to check FLP on Prav40... OK Flu shot today.  ~  May 07, 2011:  63mo ROV & pre-op clearance for TKR by DrBeane> clinically stable on minimal meds (Prav40, ASA, MVI- all others just prn);  She finished 769mo INH for pos PPD 11/11 & CXR from health dept were apparently OK but we don't have the films or reports;  she denies cough, sputum, hemoptysis, CP, palpit, dizzy,  ch in DOE, edema, etc... BP stable on diet Rx & using the Lasix just as needed for swelling in legs; FLP looks good on Prav40;  Weight is a prob but can't exerc due to DJD; taking Norco prn knee pain from DrBeane... eval here is OK for surg...  ~  July 15, 2011:  69mo ROV & she had her left TKR 7/12 by Tora Perches, then went to NH for rehab; she had to be re3admit 1 week later due to leg swelling & bruising "due to the blood thinner" she says (bleed into the calf per Ortho) ==> changed to SQ heparin, the ASA 325mg /d;  Labs from [facility] showed Hg= 9.6 on 06/23/11... She has Methocarbamol for muscle spasms & Oxycodone for pain...    Her breathing remains stable on the Symbicort & she denies much cough, sputum, wheezing, or SOB...    BP remains controlled on her Lasix 20mg /d> BP= 124/82 & she denies CP, palpit, SOB; he LE edema is stable now post op...    She is supposed to be on Crestor 5mg /d but it is unclear what she went to the NH on & ewhat they disch her on (Prev Prav40); she is reminded to bring all meds to the OVs; her weight remains elev at 234# todeay...    Other  medical problems as noted...      Problem List:  ALLERGIC RHINITIS (ICD-477.9) - on NASONEX, ASTEPRO, ZYRTEK, & allergy shots... POSITIVE PPD (ICD-795.5) CHRONIC OBSTRUCTIVE ASTHMA UNSPECIFIED (ICD-493.20) - she had +allergy skin testing for dust mites and started on allergy shots per DrESL along w/ SYMBICORT 160- 2spBid, but she doesn't take anything regularly... +smoker but decreased from 1/2 ppd x 62yrs to 1-2 cig/d. ~  2/11: she reports +PPD on pre-employment testing> sent to health dept & started on INH 300mg /d x74mo. ~  CXR 2/11 showed borderline heart size, tort Ao, sl incr markings, NAD... ~  subseq CXRs at health dept for her pos PPD but we don't have the films or reports...  Hx of HYPERTENSION (ICD-401.9) - off Lisinopril/Hct (Hx ACE reaction) & taking LASIX 20mg  as needed for swelling... BP=150/80 today and  denies HA, fatigue, visual changes, CP, palipit, dizziness, syncope, dyspnea, edema, etc... ~  EKG 6/12 showed SBrady w/ rate= 55, otherw WNL, NAD...  CAD (ICD-414.00) - known non-obstructive disease & atypic CP... on ASA 81mg /d, STATIN (Crestor 5), & off ACE. ~  cath 2/05 showed 20-60% obstructions in all 3 vessels... good LVF... ~  NuclearStressTest 3/09 was negative- no ischemia or infarction, EF=68%... ~  Eval for recurrent CP- EKG showed NSR, NAD;  2DEcho showed mild focal basal septal hypertrophy, norm wall motion w/ EF= 55-65%, mild DD & PAsys= 37;  Myoview NEG- no scar, no ischemia, EF=64%... ~  ROV w/ DrCrenshaw 7/11 reviewed- continue ASA/ Statin/ smoking cessation/ monitor BP & Chol.  VENOUS INSUFFICIENCY (ICD-459.81) - she follows a low salt diet... +LASIX 20mg /d prn for swelling... ~  1/10: we discussed no salt, Lasix20, elevation, support hose.  HYPERCHOLESTEROLEMIA (ICD-272.0) - on CRESTOR 5mg /d-  ~  FLP 5/08 on Cres5 showed TChol 115, TG 83, HDL 50, LDL 49... tolerating well and taking med regularly. ~  FLP 3/09 on Cres5 showed TChol 126, TG 104, HDL 56, LDL 49... rec- same. ~  FLP 3/10 on Cres5 showed TChol 152, TG 97, HDL 77, LDL 55 ~  FLP 12/10 on Cres5 showed TChol 127, TG 100, HDL 52, LDL 55 ~  7/11:  pt requests change to less expensive statin> try PRAVASTATIN40 ~  FLP 10/11 on Prav40 showed TChol 143, TG 125, HDL 51, LDL 67... Continue same. ~  FLP 6/12 on Prav40 showed TChol 143, TG 145, HDL 61, LDL 53  OBESITY (ICD-278.00) - unable to diet effectively and get weight down...  ~  weight 5/10 = 238#, 5\' 5"  tall,  BMI= 40... we discussed diet and exercise program... again! ~  weight 1/11 = 228# ~  weight 7/11 = 232# ~  weight 10/11 = 230# ~  Weight 6/12 = 232#  GERD (ICD-530.81) - EGD 7/05 by DrStark w/ HH- supposed to be on PPI (Omeprazole 20mg ) & H2 blocker (PEPCID 20mg ) at bedtime... ~  1/10: she stopped above meds & is recommended to restart Rx. ~  1/11:  still  not taking acid suppression meds.  COLONIC POLYPS (ICD-211.3) - last colonoscopy was 12/03 showing several 3-26mm polyps (hyperplastic)...  HX OF GALLSTONE (ICD-V12.79) - seen on CT 7/06 and referred to [facility]- eval by DrCornett and being followed...  DEGENERATIVE JOINT DISEASE (ICD-715.90) - followed by DrBeane for ortho... Right knee arthroscopy 1/09 without help... s/p right TKR 06/12/08 by Tora Perches... 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 she is off the Etodolac & takes VICODIN Prn... ~  1/11: now complaining of left knee pain &  will f/u w/ DrBeane... ~  6/12:  DrBeane plans left TKR soon... OK for surg...  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   Past Surgical History  Procedure Date  . Abdominal hysterectomy   . Cspine surgery 1993    for arnold-chiari malformation  . Right knee arthroscopy 12/2007    Dr. Shelle Iron  . 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 Shelle Iron    Outpatient Encounter Prescriptions as of 07/15/2011  Medication Sig Dispense Refill  . aspirin 325 MG tablet Take 325 mg by mouth daily.        . Azelastine HCl (ASTEPRO) 0.15 % SOLN Place 2 sprays into the nose daily.        . budesonide-formoterol (SYMBICORT) 160-4.5 MCG/ACT inhaler Inhale 2 puffs into the lungs 2 (two) times daily.        . furosemide (LASIX) 20 MG tablet Take 20 mg by mouth daily.        Marland Kitchen isoniazid (NYDRAZID) 300 MG tablet Take 300 mg by mouth daily.        . methocarbamol (ROBAXIN) 500 MG tablet Take 500 mg by mouth 4 (four) times daily.        . mometasone (NASONEX) 50 MCG/ACT nasal spray Place 2 sprays into the nose 2 (two) times daily.        . Multiple Vitamin (MULTIVITAMIN) capsule Take 1 capsule by  mouth daily.        Marland Kitchen oxyCODONE-acetaminophen (PERCOCET) 5-325 MG per tablet Take 1-2 tablets every 4 hours as needed for pain       . potassium chloride SA (K-DUR,KLOR-CON) 20 MEQ tablet Take 20 mEq by mouth daily.        . rosuvastatin (CRESTOR) 40 MG tablet Take 40 mg by mouth daily.    ?40mg ? ?5mg ?    . HYDROcodone-acetaminophen (NORCO) 5-325 MG per tablet Take 1 tablet by mouth every 6 (six) hours as needed. As needed for pain       . DISCONTD: pravastatin (PRAVACHOL) 40 MG tablet Take 40 mg by mouth daily. For cholesterol    this was her prev Rx...      No Known Allergies   Review of Systems         See HPI - all other systems neg except as noted... The patient complains of dyspnea on exertion.  The patient denies anorexia, fever, weight loss, weight gain, vision loss, decreased hearing, hoarseness, chest pain, syncope, worsening edema, prolonged cough, headaches, hemoptysis, abdominal pain, melena, hematochezia, severe indigestion/heartburn, hematuria, incontinence, muscle weakness, suspicious skin lesions, transient blindness, difficulty walking, depression, unusual weight change, abnormal bleeding, enlarged lymph nodes, and angioedema.    Objective:   Physical Exam      WD, Overweight, 70 y/o BF in NAD... GENERAL:  Alert & oriented; pleasant & cooperative... HEENT:  [place]/AT, EOM-wnl, PERRLA, EACs-clear, TMs-wnl, NOSE-clear, THROAT-clear & wnl, no lesions seen; VOICE- sl hoarse. NECK:  Supple w/ decrROM; no JVD; normal carotid impulses w/o bruits; no thyromegaly or nodules palpated; no lymphadenopathy. CHEST:  Clear to P & A; without wheezes/ rales/ or rhonchi heard... HEART:  Regular Rhythm, gr 1/6 SEM without rubs or gallops... ABDOMEN:  Obese, soft & nontender; normal bowel sounds; no organomegaly or masses detected. EXT:  s/p right TKR, mod arthritic changes; scattered  varicose veins/ +venous insuffic/ no  edema... NEURO:  CN's intact; no focal neuro deficits... DERM:  No  lesions noted; no rash etc...   Assessment & Plan:   S/P left TKR 7/12 by DrBeane>  Course complic by bleed into left calf...  Hx Asthma, ex-smoker, hx pos PPD- treated...  Stable on Symbicort which she uses as needed along w/ her Zyrtek, Astepro, Nasonex for her allergies...  HBP>  BP controlled on diet + Lasix Rx...  CAD>  Denies CP, angina, etc;  On ASA, statin, off cigs, etc...  Ven Insuffic>  On sodium restriction, elevation, support hose, prn Lasix...  CHOL>  Well controlled on her Statin med but what happened betw Ortho hosp & NH disch? We had her on Prav40 w/ good labs but not on hosp formulary & the ortho Pa didn't pay attn to med reconciliation & wrote for Crestor5; who knows what happened in the NH? Now she should restart our prev therapy ==> PRAVASTATIN 40mg /d...  OBESITY>  Needs better diet & hopefully incr exerc after the knee surg...  GI>  GERD, Polyps, Gallstone> follwed by DrStark & stable on Omep20 but uses prn only...  Other medical issues as noted.Marland KitchenMarland Kitchen

## 2011-07-20 ENCOUNTER — Encounter: Payer: Self-pay | Admitting: Pulmonary Disease

## 2011-08-07 ENCOUNTER — Encounter: Payer: Self-pay | Admitting: Pulmonary Disease

## 2011-08-07 ENCOUNTER — Ambulatory Visit (INDEPENDENT_AMBULATORY_CARE_PROVIDER_SITE_OTHER): Payer: Medicare Other | Admitting: Pulmonary Disease

## 2011-08-07 DIAGNOSIS — J449 Chronic obstructive pulmonary disease, unspecified: Secondary | ICD-10-CM

## 2011-08-07 DIAGNOSIS — M1990 Unspecified osteoarthritis, unspecified site: Secondary | ICD-10-CM

## 2011-08-07 DIAGNOSIS — M545 Low back pain, unspecified: Secondary | ICD-10-CM

## 2011-08-07 DIAGNOSIS — E7800 Pure hypercholesterolemia, unspecified: Secondary | ICD-10-CM

## 2011-08-07 DIAGNOSIS — I10 Essential (primary) hypertension: Secondary | ICD-10-CM

## 2011-08-07 DIAGNOSIS — J4489 Other specified chronic obstructive pulmonary disease: Secondary | ICD-10-CM

## 2011-08-07 DIAGNOSIS — I872 Venous insufficiency (chronic) (peripheral): Secondary | ICD-10-CM

## 2011-08-07 DIAGNOSIS — E669 Obesity, unspecified: Secondary | ICD-10-CM

## 2011-08-07 DIAGNOSIS — R609 Edema, unspecified: Secondary | ICD-10-CM

## 2011-08-07 DIAGNOSIS — Z23 Encounter for immunization: Secondary | ICD-10-CM

## 2011-08-07 MED ORDER — FUROSEMIDE 40 MG PO TABS: 40.0000 mg | ORAL_TABLET | Freq: Every day | ORAL | Status: DC

## 2011-08-07 MED ORDER — ZOLPIDEM TARTRATE 10 MG PO TABS: 10.0000 mg | ORAL_TABLET | Freq: Every evening | ORAL | Status: DC | PRN

## 2011-08-07 NOTE — Progress Notes (Signed)
Subjective:    Patient ID: [name], female    DOB: [birth year], 70 y.o.   MRN: [id number]  HPI 70 y/o BF here for a follow up of her mult med problems including: Allergies, HBP, CAD, Chol, Obesity, etc... she is also followed by DrCrenshaw for Cards, & DrESL for allergies...  ~  December 10, 2009:  she had some CP w/ f/u eval by DrCrenshaw- NEG- see details below... BP under good control, as is the Chol on Cres5mg /d... weight is down 10# but she's had incr difficulty w/ left knee pain & will check w/ DrBeane re: synvisc vs cortisone shot...  ~  June 10, 2010:  she apparently applied for job at [employer] routine PPD was pos- sent to the health dept Tb clinic & CXR was neg> started on INH (rec for 80month course- started 2/11 til 11/11) & they do labs ?monthly... we have not received any records & will request these to be sent to [place] for review (never received)... her breathing has been good- no new complaints; BP controlled on meds;  denies CP/ angina but still too sedentary & weight is UP 4#;  asking for new chol med since Cres5 is "too$$"- we discussed trial [email or web address]... finally she notes DrBeane indicated that she needs left TKR as well but she is holding off...  ~  September 20, 2010:  we still haven't received any records from health dept- they check her monthly & give her the INH & she is due to complete the course 11/11... she denies cough, sputum, hemoptysis, CP, ch in DOB/ DOE... states she quit smoking last night!!!  BP remains controlled on med;  she saw DrCrenshaw 7/11- doing well, no changes made, f/u 50yr;  needs to ret fasting to check FLP on Prav40... OK Flu shot today.  ~  May 07, 2011:  721mo ROV & pre-op clearance for TKR by DrBeane> clinically stable on minimal meds (Prav40, ASA, MVI- all others just prn);  She finished 21mo INH for pos PPD 11/11 & CXR from health dept were apparently OK but we don't have the films or reports;  she denies cough, sputum, hemoptysis, CP, palpit, dizzy,  ch in DOE, edema, etc... BP stable on diet Rx & using the Lasix just as needed for swelling in legs; FLP looks good on Prav40;  Weight is a prob but can't exerc due to DJD; taking Norco prn knee pain from DrBeane... eval here is OK for surg...  ~  July 15, 2011:  50mo ROV & she had her left TKR 7/12 by Tora Perches, then went to NH for rehab; she had to be readmit 1 week later due to leg swelling & bruising "due to the blood thinner" she says (bleed into the calf per Ortho) ==> changed to SQ heparin, the ASA 325mg /d;  Labs from [facility] showed Hg= 9.6 on 06/23/11... She has Methocarbamol for muscle spasms & Oxycodone for pain...    >Her breathing remains stable on the Symbicort & she denies much cough, sputum, wheezing, or SOB...    >BP remains controlled on her Lasix 20mg /d> BP= 124/82 & she denies CP, palpit, SOB; he LE edema is stable now post op...    >She is supposed to be on Crestor 5mg /d but it is unclear what she went to the NH on & what they disch her on (Prev Prav40); she is reminded to bring all meds to the OVs; her weight remains elev at 234# today...    >Other  medical problems as noted...  ~  August 07, 2011:  3wk ROV & she is stable> getting PT twice weekly at [facility] & doing it at ome the other 5d;  Her wt is up 2# to 236# & BP= 150/94 on Lasix 20mg /d & K20 daily... She notes some insomnia related to leg throbbing & persist edema so we decided to incr the LASIX to 40mg d & wrote new prescription for [facility] to use prn...             Problem List:  ALLERGIC RHINITIS (ICD-477.9) - on NASONEX, ASTEPRO, ZYRTEK, & allergy shots... POSITIVE PPD (ICD-795.5) CHRONIC OBSTRUCTIVE ASTHMA UNSPECIFIED (ICD-493.20) - she had +allergy skin testing for dust mites and started on allergy shots per DrESL along w/ SYMBICORT 160- 2spBid, but she doesn't take anything regularly... +smoker but decreased from 1/2 ppd x 19yrs to 1-2 cig/d. ~  2/11: she reports +PPD on pre-employment testing> sent  to health dept & started on INH 300mg /d x18mo. ~  CXR 2/11 showed borderline heart size, tort Ao, sl incr markings, NAD... ~  subseq CXRs at health dept for her pos PPD but we don't have the films or reports...  Hx of HYPERTENSION (ICD-401.9) - off Lisinopril/Hct (Hx ACE reaction) & taking LASIX 20mg  as needed for swelling... BP=150/94 today and denies HA, fatigue, visual changes, CP, palipit, dizziness, syncope, dyspnea, edema, etc... ~  EKG 6/12 showed SBrady w/ rate= 55, otherw WNL, NAD...  CAD (ICD-414.00) - known non-obstructive disease & atypic CP... on ASA 81mg /d, STATIN (Crestor 5), & off ACE. ~  cath 2/05 showed 20-60% obstructions in all 3 vessels... good LVF... ~  NuclearStressTest 3/09 was negative- no ischemia or infarction, EF=68%... ~  Eval for recurrent CP- EKG showed NSR, NAD;  2DEcho showed mild focal basal septal hypertrophy, norm wall motion w/ EF= 55-65%, mild DD & PAsys= 37;  Myoview NEG- no scar, no ischemia, EF=64%... ~  ROV w/ DrCrenshaw 7/11 reviewed- continue ASA/ Statin/ smoking cessation/ monitor BP & Chol.  VENOUS INSUFFICIENCY (ICD-459.81) - she follows a low salt diet... +LASIX 20mg /d prn for swelling... ~  1/10: we discussed no salt, Lasix20, elevation, support hose.  HYPERCHOLESTEROLEMIA (ICD-272.0) - on CRESTOR 5mg /d-  ~  FLP 5/08 on Cres5 showed TChol 115, TG 83, HDL 50, LDL 49... tolerating well and taking med regularly. ~  FLP 3/09 on Cres5 showed TChol 126, TG 104, HDL 56, LDL 49... rec- same. ~  FLP 3/10 on Cres5 showed TChol 152, TG 97, HDL 77, LDL 55 ~  FLP 12/10 on Cres5 showed TChol 127, TG 100, HDL 52, LDL 55 ~  7/11:  pt requests change to less expensive statin> try [email or web address] ~  FLP 10/11 on Prav40 showed TChol 143, TG 125, HDL 51, LDL 67... Continue same. ~  FLP 6/12 on Prav40 showed TChol 143, TG 145, HDL 61, LDL 53  OBESITY (ICD-278.00) - unable to diet effectively and get weight down...  ~  weight 5/10 = 238#, 5\' 5"  tall,  BMI= 40... we  discussed diet and exercise program... again! ~  weight 1/11 = 228# ~  weight 7/11 = 232# ~  weight 10/11 = 230# ~  Weight 6/12 = 232#  GERD (ICD-530.81) - EGD 7/05 by DrStark w/ HH- supposed to be on PPI (Omeprazole 20mg ) & H2 blocker (PEPCID 20mg ) at bedtime... ~  1/10: she stopped above meds & is recommended to restart Rx. ~  1/11:  still not taking acid suppression meds.  COLONIC POLYPS (ICD-211.3) -  last colonoscopy was 12/03 showing several 3-44mm polyps (hyperplastic)...  HX OF GALLSTONE (ICD-V12.79) - seen on CT 7/06 and referred to [facility]- eval by DrCornett and being followed...  DEGENERATIVE JOINT DISEASE (ICD-715.90) - followed by DrBeane for ortho... Right knee arthroscopy 1/09 without help... s/p right TKR 06/12/08 by Tora Perches... 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 she is off the Etodolac & takes VICODIN Prn... ~  1/11: now complaining of left knee pain & will f/u w/ DrBeane... ~  6/12:  DrBeane plans left TKR soon... OK for surg...  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   Past Surgical History  Procedure Date  . Abdominal hysterectomy   . Cspine surgery 1993    for arnold-chiari malformation  . Right knee arthroscopy 12/2007    Dr. Shelle Iron  . 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 Shelle Iron    Outpatient Encounter Prescriptions as of 08/07/2011  Medication Sig Dispense Refill  . aspirin 325 MG tablet Take 325 mg by mouth daily.        . Azelastine HCl (ASTEPRO) 0.15 % SOLN Place 2 sprays into the nose daily.        . budesonide-formoterol (SYMBICORT) 160-4.5 MCG/ACT inhaler Inhale 2 puffs into the lungs 2 (two) times daily.        . furosemide (LASIX) 20 MG tablet Take 20 mg by  mouth daily.        Marland Kitchen HYDROcodone-acetaminophen (NORCO) 5-325 MG per tablet Take 1 tablet by mouth every 6 (six) hours as needed. As needed for pain       . methocarbamol (ROBAXIN) 500 MG tablet Take 500 mg by mouth 4 (four) times daily.        . mometasone (NASONEX) 50 MCG/ACT nasal spray Place 2 sprays into the nose 2 (two) times daily.        . Multiple Vitamin (MULTIVITAMIN) capsule Take 1 capsule by mouth daily.        Marland Kitchen oxyCODONE-acetaminophen (PERCOCET) 5-325 MG per tablet Take 1-2 tablets every 4 hours as needed for pain       . potassium chloride SA (K-DUR,KLOR-CON) 20 MEQ tablet Take 20 mEq by mouth daily.        . pravastatin (PRAVACHOL) 40 MG tablet Take 1 tablet (40 mg total) by mouth daily.  30 tablet  11  . isoniazid (NYDRAZID) 300 MG tablet Take 300 mg by mouth daily.          No Known Allergies   Current Medications, Allergies, Past Medical History, Past Surgical History, Family History, and Social History were reviewed in [employer] record.    Review of Systems         See HPI - all other systems neg except as noted... The patient complains of dyspnea on exertion.  The patient denies anorexia, fever, weight loss, weight gain, vision loss, decreased hearing, hoarseness, chest pain, syncope, worsening edema, prolonged cough, headaches, hemoptysis, abdominal pain, melena, hematochezia, severe indigestion/heartburn, hematuria, incontinence, muscle weakness, suspicious skin lesions, transient blindness, difficulty walking, depression, unusual weight change, abnormal bleeding, enlarged lymph nodes, and angioedema.     Objective:   Physical Exam      WD, Overweight, 70 y/o BF in NAD... GENERAL:  Alert &  oriented; pleasant & cooperative... HEENT:  [place]/AT, EOM-wnl, PERRLA, EACs-clear, TMs-wnl, NOSE-clear, THROAT-clear & wnl, no lesions seen; VOICE- sl hoarse. NECK:  Supple w/ decrROM; no JVD; normal carotid impulses w/o bruits; no thyromegaly or nodules  palpated; no lymphadenopathy. CHEST:  Clear to P & A; without wheezes/ rales/ or rhonchi heard... HEART:  Regular Rhythm, gr 1/6 SEM without rubs or gallops... ABDOMEN:  Obese, soft & nontender; normal bowel sounds; no organomegaly or masses detected. EXT:  s/p right TKR, mod arthritic changes; scattered  varicose veins/ +venous insuffic/ 1-2+ edema... NEURO:  CN's intact; no focal neuro deficits... DERM:  No lesions noted; no rash etc...   Assessment & Plan:   S/P left TKR 7/12 by DrBeane>  Course complic by bleed into left calf; now stable & improving w/ Pt etc...  Hx Asthma, ex-smoker, hx pos PPD- treated...  Stable on Symbicort which she uses as needed along w/ her Zyrtek, Astepro, Nasonex for her allergies...  HBP>  BP controlled on diet + Lasix Rx...  CAD>  Denies CP, angina, etc;  On ASA, statin, off cigs, etc...  Ven Insuffic>  On sodium restriction, elevation, support hose; we will incr the LASIX to 40mg /d...  CHOL>  Well controlled on her Statin med but what happened betw Ortho hosp & NH disch? We had her on Prav40 w/ good labs but not on hosp formulary & the ortho Pa didn't pay attn to med reconciliation & wrote for Crestor5; who knows what happened in the NH? Now she should restart our prev therapy ==> PRAVASTATIN 40mg /d...  OBESITY>  Needs better diet & hopefully incr exerc after the knee surg...  GI>  GERD, Polyps, Gallstone> follwed by DrStark & stable on Omep20 but uses prn only...  Other medical issues as noted.Marland KitchenMarland Kitchen

## 2011-08-07 NOTE — Patient Instructions (Signed)
Today we updated your med list in EPIC...    We decided to increase the LASIX to 40mg  each AM...    We also wrote a new prescription for AMBIEN (Zolpidem) to take at bedtime if needed for sleep...  REMEMBER:  No Salt, elevate those legs, use an ACE wrap when up & about...  Let's plan a follow up visit in  6 weeks time.Marland KitchenMarland Kitchen

## 2011-08-10 ENCOUNTER — Encounter: Payer: Self-pay | Admitting: Pulmonary Disease

## 2011-08-20 LAB — I-STAT 8, (EC8 V) (CONVERTED LAB)
Acid-Base Excess: 1
BUN: 7
Bicarbonate: 26.8 — ABNORMAL HIGH
Chloride: 107
Glucose, Bld: 93
HCT: 42
Hemoglobin: 14.3
Operator id: 114531
Potassium: 4.1
Sodium: 140
TCO2: 28
pCO2, Ven: 47.1
pH, Ven: 7.363 — ABNORMAL HIGH

## 2011-08-27 ENCOUNTER — Telehealth: Payer: Self-pay | Admitting: Pulmonary Disease

## 2011-08-27 NOTE — Telephone Encounter (Signed)
Spoke with pt. She is c/o "knot on wrist" since woke up this am. She states does not recall any injury to wrist and is concerned. She states it is sore to touch. OV with TP tomorrow am at 10:30 for eval.

## 2011-08-28 ENCOUNTER — Ambulatory Visit (INDEPENDENT_AMBULATORY_CARE_PROVIDER_SITE_OTHER): Payer: Medicare Other | Admitting: Adult Health

## 2011-08-28 ENCOUNTER — Encounter: Payer: Self-pay | Admitting: Adult Health

## 2011-08-28 VITALS — BP 116/72 | HR 61 | Temp 98.0°F | Ht 65.0 in | Wt 231.8 lb

## 2011-08-28 DIAGNOSIS — M25539 Pain in unspecified wrist: Secondary | ICD-10-CM

## 2011-08-28 LAB — CBC
HCT: 32.2 — ABNORMAL LOW
HCT: 30.7 — ABNORMAL LOW
HCT: 35.5 — ABNORMAL LOW
Hemoglobin: 10.9 — ABNORMAL LOW
Hemoglobin: 10 — ABNORMAL LOW
Hemoglobin: 11.7 — ABNORMAL LOW
MCHC: 33.9
MCHC: 32.4
MCHC: 33
MCV: 81.4
MCV: 81.7
MCV: 82.5
Platelets: 217
Platelets: 171
Platelets: 196
RBC: 3.95
RBC: 3.72 — ABNORMAL LOW
RBC: 4.34
RDW: 13.4
RDW: 13.4
RDW: 13.5
WBC: 4.8
WBC: 3.5 — ABNORMAL LOW
WBC: 7.3

## 2011-08-28 LAB — URINALYSIS, ROUTINE W REFLEX MICROSCOPIC
Bilirubin Urine: NEGATIVE
Glucose, UA: NEGATIVE
Hgb urine dipstick: NEGATIVE
Ketones, ur: NEGATIVE
Nitrite: NEGATIVE
Protein, ur: NEGATIVE
Specific Gravity, Urine: 1.011
Urobilinogen, UA: 0.2
pH: 6

## 2011-08-28 LAB — DIFFERENTIAL
Basophils Absolute: 0.1
Basophils Absolute: 0.1
Basophils Relative: 1
Basophils Relative: 2 — ABNORMAL HIGH
Eosinophils Absolute: 0.2
Eosinophils Absolute: 0.1
Eosinophils Relative: 4
Eosinophils Relative: 4
Lymphocytes Relative: 48 — ABNORMAL HIGH
Lymphocytes Relative: 37
Lymphs Abs: 2.3
Lymphs Abs: 1.3
Monocytes Absolute: 0.4
Monocytes Absolute: 0.3
Monocytes Relative: 8
Monocytes Relative: 8
Neutro Abs: 1.9
Neutro Abs: 1.7
Neutrophils Relative %: 40 — ABNORMAL LOW
Neutrophils Relative %: 49

## 2011-08-28 LAB — BASIC METABOLIC PANEL
BUN: 5 — ABNORMAL LOW
CO2: 25
Calcium: 8.1 — ABNORMAL LOW
Chloride: 104
Creatinine, Ser: 0.6
GFR calc Af Amer: >60
GFR calc non Af Amer: >60
Glucose, Bld: 125 — ABNORMAL HIGH
Potassium: 3.5
Sodium: 134 — ABNORMAL LOW

## 2011-08-28 LAB — POCT CARDIAC MARKERS
CKMB, poc: 1.5
CKMB, poc: 2.4
Myoglobin, poc: 136
Myoglobin, poc: 78
Operator id: 290111
Operator id: 290111
Troponin i, poc: <0.05
Troponin i, poc: <0.05

## 2011-08-28 LAB — APTT: aPTT: 28

## 2011-08-28 LAB — POCT I-STAT, CHEM 8
BUN: 19
Calcium, Ion: 1.03 — ABNORMAL LOW
Chloride: 102
Creatinine, Ser: 1
Glucose, Bld: 118 — ABNORMAL HIGH
HCT: 33 — ABNORMAL LOW
Hemoglobin: 11.2 — ABNORMAL LOW
Potassium: 3.6
Sodium: 135
TCO2: 20

## 2011-08-28 LAB — COMPREHENSIVE METABOLIC PANEL
ALT: 15
AST: 21
Albumin: 3.1 — ABNORMAL LOW
Alkaline Phosphatase: 90
BUN: 6
CO2: 27
Calcium: 9.2
Chloride: 108
Creatinine, Ser: 0.66
GFR calc Af Amer: >60
GFR calc non Af Amer: >60
Glucose, Bld: 103 — ABNORMAL HIGH
Potassium: 4
Sodium: 142
Total Bilirubin: 0.6
Total Protein: 6.2

## 2011-08-28 LAB — PROTIME-INR
INR: 1
INR: 1.1
Prothrombin Time: 13.7
Prothrombin Time: 14.8

## 2011-08-28 LAB — TYPE AND SCREEN
ABO/RH(D): A POS
Antibody Screen: NEGATIVE

## 2011-08-28 LAB — ABO/RH: ABO/RH(D): A POS

## 2011-08-28 LAB — D-DIMER, QUANTITATIVE: D-Dimer, Quant: 0.64 — ABNORMAL HIGH

## 2011-08-28 NOTE — Patient Instructions (Addendum)
We are referring you to orthopedics to evaluate your wrist.  Warm compresses to wrist.  Advil 200 mg 2 po .Twice daily  For pain  Please contact office for sooner follow up if symptoms do not improve or worsen or seek emergency care

## 2011-08-28 NOTE — Assessment & Plan Note (Signed)
Left wrist painful nodule - suspect is ganglion cyst however on inner wrist  Will refer to ortho due to pain level and nodule is right at the bend of the wrist   Plan;   refer to ortho hand specialist.  advil prn Warm compresses  Wrist split as needed.

## 2011-08-28 NOTE — Progress Notes (Signed)
Subjective:    Patient ID: [name], female    DOB: 01-Jul-1941, 70 y.o.   MRN: [id number]  HPI 70 y/o BF with known hx of  Allergies, HBP, CAD, Chol, Obesity, etc... she is also followed by Aletta Edouard for Cards, & DrESL for allergies...  ~  December 10, 2009:  she had some CP w/ f/u eval by DrCrenshaw- NEG- see details below... BP under good control, as is the Chol on Cres5mg /d... weight is down 10# but she's had incr difficulty w/ left knee pain & will check w/ DrBeane re: synvisc vs cortisone shot...  ~  June 10, 2010:  she apparently applied for job at [employer] routine PPD was pos- sent to the health dept Tb clinic & CXR was neg> started on INH (rec for 30month course- started 2/11 til 11/11) & they do labs ?monthly... we have not received any records & will request these to be sent to [place] for review (never received)... her breathing has been good- no new complaints; BP controlled on meds;  denies CP/ angina but still too sedentary & weight is UP 4#;  asking for new chol med since Cres5 is "too$$"- we discussed trial [email or web address]... finally she notes DrBeane indicated that she needs left TKR as well but she is holding off...  ~  September 20, 2010:  we still haven't received any records from health dept- they check her monthly & give her the INH & she is due to complete the course 11/11... she denies cough, sputum, hemoptysis, CP, ch in DOB/ DOE... states she quit smoking last night!!!  BP remains controlled on med;  she saw DrCrenshaw 7/11- doing well, no changes made, f/u 65yr;  needs to ret fasting to check FLP on Prav40... OK Flu shot today.  ~  May 07, 2011:  441mo ROV & pre-op clearance for TKR by DrBeane> clinically stable on minimal meds (Prav40, ASA, MVI- all others just prn);  She finished 41mo INH for pos PPD 11/11 & CXR from health dept were apparently OK but we don't have the films or reports;  she denies cough, sputum, hemoptysis, CP, palpit, dizzy, ch in DOE, edema, etc... BP stable on  diet Rx & using the Lasix just as needed for swelling in legs; FLP looks good on Prav40;  Weight is a prob but can't exerc due to DJD; taking Norco prn knee pain from DrBeane... eval here is OK for surg...  ~  July 15, 2011:  37mo ROV & she had her left TKR 7/12 by Tora Perches, then went to NH for rehab; she had to be readmit 1 week later due to leg swelling & bruising "due to the blood thinner" she says (bleed into the calf per Ortho) ==> changed to SQ heparin, the ASA 325mg /d;  Labs from [facility] NH showed Hg= 9.6 on 06/23/11... She has Methocarbamol for muscle spasms & Oxycodone for pain...    >Her breathing remains stable on the Symbicort & she denies much cough, sputum, wheezing, or SOB...    >BP remains controlled on her Lasix 20mg /d> BP= 124/82 & she denies CP, palpit, SOB; he LE edema is stable now post op...    >She is supposed to be on Crestor 5mg /d but it is unclear what she went to the NH on & what they disch her on (Prev Prav40); she is reminded to bring all meds to the OVs; her weight remains elev at 234# today...    >Other medical problems as noted...  ~  August 07, 2011:  3wk ROV & she is stable> getting PT twice weekly at [facility] & doing it at ome the other 5d;  Her wt is up 2# to 236# & BP= 150/94 on Lasix 20mg /d & K20 daily... She notes some insomnia related to leg throbbing & persist edema so we decided to incr the LASIX to 40mg d & wrote new prescription for [facility] to use prn...     ~08/28/2011 Acute OV  Pt complains of swollen painful knot on inside of left wrist x 2 days. She states that she has had some occ pain in that area for a while now and she related to arthritis. Noticed yesterday she had a very tender knot that popped up in inside of left wrist along thumb area. No known injury . No redness or fever.          Problem List:  ALLERGIC RHINITIS (ICD-477.9) - on NASONEX, ASTEPRO, ZYRTEK, & allergy shots... POSITIVE PPD (ICD-795.5) CHRONIC OBSTRUCTIVE ASTHMA  UNSPECIFIED (ICD-493.20) - she had +allergy skin testing for dust mites and started on allergy shots per DrESL along w/ SYMBICORT 160- 2spBid, but she doesn't take anything regularly... +smoker but decreased from 1/2 ppd x 46yrs to 1-2 cig/d. ~  2/11: she reports +PPD on pre-employment testing> sent to health dept & started on INH 300mg /d x81mo. ~  CXR 2/11 showed borderline heart size, tort Ao, sl incr markings, NAD... ~  subseq CXRs at health dept for her pos PPD but we don't have the films or reports...  Hx of HYPERTENSION (ICD-401.9) - off Lisinopril/Hct (Hx ACE reaction) & taking LASIX 20mg  as needed for swelling... BP=150/94 today and denies HA, fatigue, visual changes, CP, palipit, dizziness, syncope, dyspnea, edema, etc... ~  EKG 6/12 showed SBrady w/ rate= 55, otherw WNL, NAD...  CAD (ICD-414.00) - known non-obstructive disease & atypic CP... on ASA 81mg /d, STATIN (Crestor 5), & off ACE. ~  cath 2/05 showed 20-60% obstructions in all 3 vessels... good LVF... ~  NuclearStressTest 3/09 was negative- no ischemia or infarction, EF=68%... ~  Eval for recurrent CP- EKG showed NSR, NAD;  2DEcho showed mild focal basal septal hypertrophy, norm wall motion w/ EF= 55-65%, mild DD & PAsys= 37;  Myoview NEG- no scar, no ischemia, EF=64%... ~  ROV w/ DrCrenshaw 7/11 reviewed- continue ASA/ Statin/ smoking cessation/ monitor BP & Chol.  VENOUS INSUFFICIENCY (ICD-459.81) - she follows a low salt diet... +LASIX 20mg /d prn for swelling... ~  1/10: we discussed no salt, Lasix20, elevation, support hose.  HYPERCHOLESTEROLEMIA (ICD-272.0) - on CRESTOR 5mg /d-  ~  FLP 5/08 on Cres5 showed TChol 115, TG 83, HDL 50, LDL 49... tolerating well and taking med regularly. ~  FLP 3/09 on Cres5 showed TChol 126, TG 104, HDL 56, LDL 49... rec- same. ~  FLP 3/10 on Cres5 showed TChol 152, TG 97, HDL 77, LDL 55 ~  FLP 12/10 on Cres5 showed TChol 127, TG 100, HDL 52, LDL 55 ~  7/11:  pt requests change to less expensive  statin> try [email or web address] ~  FLP 10/11 on Prav40 showed TChol 143, TG 125, HDL 51, LDL 67... Continue same. ~  FLP 6/12 on Prav40 showed TChol 143, TG 145, HDL 61, LDL 53  OBESITY (ICD-278.00) - unable to diet effectively and get weight down...  ~  weight 5/10 = 238#, 5\' 5"  tall,  BMI= 40... we discussed diet and exercise program... again! ~  weight 1/11 = 228# ~  weight 7/11 = 232# ~  weight  10/11 = 230# ~  Weight 6/12 = 232#  GERD (ICD-530.81) - EGD 7/05 by DrStark w/ HH- supposed to be on PPI (Omeprazole 20mg ) & H2 blocker (PEPCID 20mg ) at bedtime... ~  1/10: she stopped above meds & is recommended to restart Rx. ~  1/11:  still not taking acid suppression meds.  COLONIC POLYPS (ICD-211.3) - last colonoscopy was 12/03 showing several 3-40mm polyps (hyperplastic)...  HX OF GALLSTONE (ICD-V12.79) - seen on CT 7/06 and referred to [facility]- eval by DrCornett and being followed...  DEGENERATIVE JOINT DISEASE (ICD-715.90) - followed by DrBeane for ortho... Right knee arthroscopy 1/09 without help... s/p right TKR 06/12/08 by Tora Perches... 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 she is off the Etodolac & takes VICODIN Prn... ~  1/11: now complaining of left knee pain & will f/u w/ DrBeane... ~  6/12:  DrBeane plans left TKR soon... OK for surg...  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   Past Surgical History  Procedure Date  . Abdominal hysterectomy   . Cspine surgery 1993    for arnold-chiari malformation  . Right knee arthroscopy 12/2007    Dr. Shelle Iron  . 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 Shelle Iron    Outpatient Encounter Prescriptions as of 08/28/2011  Medication Sig Dispense Refill  .  aspirin 325 MG tablet Take 325 mg by mouth daily.        . Azelastine HCl (ASTEPRO) 0.15 % SOLN Place 2 sprays into the nose daily.        . budesonide-formoterol (SYMBICORT) 160-4.5 MCG/ACT inhaler Inhale 2 puffs into the lungs 2 (two) times daily.        . furosemide (LASIX) 40 MG tablet Take 1 tablet (40 mg total) by mouth daily.  30 tablet  5  . HYDROcodone-acetaminophen (NORCO) 5-325 MG per tablet Take 1 tablet by mouth every 6 (six) hours as needed. As needed for pain       . methocarbamol (ROBAXIN) 500 MG tablet Take 500 mg by mouth 4 (four) times daily.        . mometasone (NASONEX) 50 MCG/ACT nasal spray Place 2 sprays into the nose 2 (two) times daily.        . Multiple Vitamin (MULTIVITAMIN) capsule Take 1 capsule by mouth daily.        Marland Kitchen oxyCODONE-acetaminophen (PERCOCET) 5-325 MG per tablet Take 1-2 tablets every 4 hours as needed for pain       . potassium chloride SA (K-DUR,KLOR-CON) 20 MEQ tablet Take 20 mEq by mouth daily.        . pravastatin (PRAVACHOL) 40 MG tablet Take 1 tablet (40 mg total) by mouth daily.  30 tablet  11  . zolpidem (AMBIEN) 10 MG tablet Take 1 tablet (10 mg total) by mouth at bedtime as needed for sleep.  30 tablet  5  . DISCONTD: isoniazid (NYDRAZID) 300 MG tablet Take 300 mg by mouth daily.          No Known Allergies   Current Medications, Allergies, Past Medical History, Past Surgical History, Family History, and Social History were reviewed in [employer] record.    Review of Systems         See HPI -  all other systems neg except as noted... The patient complains of dyspnea on exertion.  The patient denies anorexia, fever, weight loss, weight gain, vision loss, decreased hearing, hoarseness, chest pain, syncope, worsening edema, prolonged cough, headaches, hemoptysis, abdominal pain, melena, hematochezia, severe indigestion/heartburn, hematuria, incontinence, muscle weakness, suspicious skin lesions, transient blindness,  difficulty walking, depression, unusual weight change, abnormal bleeding, enlarged lymph nodes, and angioedema.     Objective:   Physical Exam      WD, Overweight, 70 y/o BF in NAD... GENERAL:  Alert & oriented; pleasant & cooperative... HEENT:  /AT, EOM-wnl, PERRLA, EACs-clear, TMs-wnl, NOSE-clear, THROAT-clear & wnl, no lesions seen; VOICE- sl hoarse. NECK:  Supple w/ decrROM; no JVD; normal carotid impulses w/o bruits; no thyromegaly or nodules palpated; no lymphadenopathy. CHEST:  Clear to P & A; without wheezes/ rales/ or rhonchi heard... HEART:  Regular Rhythm, gr 1/6 SEM without rubs or gallops... ABDOMEN:  Obese, soft & nontender; normal bowel sounds; no organomegaly or masses detected. EXT:  s/p right TKR, mod arthritic changes; scattered  varicose veins/ +venous insuffic/ 1-2+ edema... Along left lateral  inner wrist very tender soft nodule , no redness. Pulses intact. nml grips. No joint deformity noted.  NEURO:  CN's intact; no focal neuro deficits... DERM:  No lesions noted; no rash etc...   Assessment & Plan:

## 2011-08-29 LAB — PROTIME-INR
INR: 1.6 — ABNORMAL HIGH
INR: 2.2 — ABNORMAL HIGH
INR: 2.3 — ABNORMAL HIGH
Prothrombin Time: 19.7 — ABNORMAL HIGH
Prothrombin Time: 25.9 — ABNORMAL HIGH
Prothrombin Time: 27.2 — ABNORMAL HIGH

## 2011-08-29 LAB — BASIC METABOLIC PANEL
BUN: 3 — ABNORMAL LOW
BUN: 5 — ABNORMAL LOW
CO2: 27
CO2: 28
Calcium: 8.4
Calcium: 8.9
Chloride: 103
Chloride: 105
Creatinine, Ser: 0.65
Creatinine, Ser: 0.66
GFR calc Af Amer: >60
GFR calc Af Amer: >60
GFR calc non Af Amer: >60
GFR calc non Af Amer: >60
Glucose, Bld: 119 — ABNORMAL HIGH
Glucose, Bld: 129 — ABNORMAL HIGH
Potassium: 3.7
Potassium: 4.1
Sodium: 134 — ABNORMAL LOW
Sodium: 140

## 2011-08-29 LAB — CBC
HCT: 27.8 — ABNORMAL LOW
HCT: 29.6 — ABNORMAL LOW
HCT: 31.3 — ABNORMAL LOW
Hemoglobin: 10.3 — ABNORMAL LOW
Hemoglobin: 9 — ABNORMAL LOW
Hemoglobin: 9.8 — ABNORMAL LOW
MCHC: 32.6
MCHC: 32.9
MCHC: 33.1
MCV: 81.7
MCV: 82
MCV: 82.7
Platelets: 144 — ABNORMAL LOW
Platelets: 159
Platelets: 200
RBC: 3.36 — ABNORMAL LOW
RBC: 3.63 — ABNORMAL LOW
RBC: 3.82 — ABNORMAL LOW
RDW: 13.4
RDW: 13.6
RDW: 13.6
WBC: 6.1
WBC: 7.5
WBC: 8.6

## 2011-09-08 ENCOUNTER — Other Ambulatory Visit: Payer: Self-pay

## 2011-09-08 DIAGNOSIS — I83893 Varicose veins of bilateral lower extremities with other complications: Secondary | ICD-10-CM

## 2011-09-15 ENCOUNTER — Encounter: Payer: Self-pay | Admitting: Surgery

## 2011-09-19 ENCOUNTER — Other Ambulatory Visit: Payer: Self-pay | Admitting: Pulmonary Disease

## 2011-09-19 ENCOUNTER — Telehealth: Payer: Self-pay | Admitting: Pulmonary Disease

## 2011-09-19 ENCOUNTER — Encounter: Payer: Self-pay | Admitting: Pulmonary Disease

## 2011-09-19 ENCOUNTER — Ambulatory Visit (INDEPENDENT_AMBULATORY_CARE_PROVIDER_SITE_OTHER): Payer: Medicare Other | Admitting: Pulmonary Disease

## 2011-09-19 DIAGNOSIS — D126 Benign neoplasm of colon, unspecified: Secondary | ICD-10-CM

## 2011-09-19 DIAGNOSIS — M1990 Unspecified osteoarthritis, unspecified site: Secondary | ICD-10-CM

## 2011-09-19 DIAGNOSIS — G9050 Complex regional pain syndrome I, unspecified: Secondary | ICD-10-CM

## 2011-09-19 DIAGNOSIS — E7800 Pure hypercholesterolemia, unspecified: Secondary | ICD-10-CM

## 2011-09-19 DIAGNOSIS — J309 Allergic rhinitis, unspecified: Secondary | ICD-10-CM

## 2011-09-19 DIAGNOSIS — J449 Chronic obstructive pulmonary disease, unspecified: Secondary | ICD-10-CM

## 2011-09-19 DIAGNOSIS — I872 Venous insufficiency (chronic) (peripheral): Secondary | ICD-10-CM

## 2011-09-19 DIAGNOSIS — E669 Obesity, unspecified: Secondary | ICD-10-CM

## 2011-09-19 DIAGNOSIS — M545 Low back pain, unspecified: Secondary | ICD-10-CM

## 2011-09-19 DIAGNOSIS — I2510 Atherosclerotic heart disease of native coronary artery without angina pectoris: Secondary | ICD-10-CM

## 2011-09-19 DIAGNOSIS — K219 Gastro-esophageal reflux disease without esophagitis: Secondary | ICD-10-CM

## 2011-09-19 DIAGNOSIS — I10 Essential (primary) hypertension: Secondary | ICD-10-CM

## 2011-09-19 MED ORDER — POTASSIUM CHLORIDE CRYS ER 20 MEQ PO TBCR: 20.0000 meq | EXTENDED_RELEASE_TABLET | Freq: Every day | ORAL | Status: DC

## 2011-09-19 NOTE — Telephone Encounter (Signed)
[facility] is closed until 2pm.  Will call back to cancel order for the potassium that was sent in this am.  Refill for [facility] [address] and pt is aware.  Pt stated that she told [facility] that she is not going to get this rx from them and did not pick this rx up.

## 2011-09-19 NOTE — Patient Instructions (Signed)
Today we updated your med list in epic...    We refilled the KCl per your request...  Stay as active as possible & bee careful...  Call for any questions...  Otherwise let's plan a follow up visit w/ FASTING blood work in about 4 months.Marland KitchenMarland Kitchen

## 2011-09-19 NOTE — Progress Notes (Signed)
Subjective:    Patient ID: [name], female    DOB: 03/25/1941, 70 y.o.   MRN: [id number]  HPI 70 y/o BF here for a follow up of her mult med problems including: Allergies, HBP, CAD, Chol, Obesity, etc... she is also followed by DrCrenshaw for Cards, & DrESL for allergies...  ~  June 10, 2010:  she apparently applied for job at [employer] routine PPD was pos- sent to the health dept Tb clinic & CXR was neg> started on INH (rec for 572month course- started 2/11 til 11/11) & they do labs ?monthly... we have not received any records & will request these to be sent to [place] for review (never received)... her breathing has been good- no new complaints; BP controlled on meds;  denies CP/ angina but still too sedentary & weight is UP 4#;  asking for new chol med since Cres5 is "too$$"- we discussed trial PRAVACHOL40... finally she notes DrBeane indicated that she needs left TKR as well but she is holding off...  ~  September 20, 2010:  we still haven't received any records from health dept- they check her monthly & give her the INH & she is due to complete the course 11/11... she denies cough, sputum, hemoptysis, CP, ch in DOB/ DOE... states she quit smoking last night!!!  BP remains controlled on med;  she saw DrCrenshaw 7/11- doing well, no changes made, f/u 48yr;  needs to ret fasting to check FLP on Prav40... OK Flu shot today.  ~  May 07, 2011:  972mo ROV & pre-op clearance for TKR by DrBeane> clinically stable on minimal meds (Prav40, ASA, MVI- all others just prn);  She finished 372mo INH for pos PPD 11/11 & CXR from health dept were apparently OK but we don't have the films or reports;  she denies cough, sputum, hemoptysis, CP, palpit, dizzy, ch in DOE, edema, etc... BP stable on diet Rx & using the Lasix just as needed for swelling in legs; FLP looks good on Prav40;  Weight is a prob but can't exerc due to DJD; taking Norco prn knee pain from DrBeane... eval here is OK for surg...  ~  July 15, 2011:  72mo  ROV & she had her left TKR 7/12 by Tora Perches, then went to NH for rehab; she had to be readmit 1 week later due to leg swelling & bruising "due to the blood thinner" she says (bleed into the calf per Ortho) ==> changed to SQ heparin, the ASA 325mg /d;  Labs from [facility] showed Hg= 9.6 on 06/23/11... She has Methocarbamol for muscle spasms & Oxycodone for pain...    >Her breathing remains stable on the Symbicort & she denies much cough, sputum, wheezing, or SOB...    >BP remains controlled on her Lasix 20mg /d> BP= 124/82 & she denies CP, palpit, SOB; he LE edema is stable now post op...    >She is supposed to be on Crestor 5mg /d but it is unclear what she went to the NH on & what they disch her on (prev Prav40); she is reminded to bring all meds to the OVs; her weight remains elev at 234# today...    >Other medical problems as noted...  ~  August 07, 2011:  3wk ROV & she is stable> getting PT twice weekly at [facility] & doing it at home the other 5d;  Her wt is up 2# to 236# & BP= 150/94 on Lasix 20mg /d & K20 daily... She notes some insomnia related  to leg throbbing & persist edema so we decided to incr the LASIX to 40mg d & wrote new prescription for [facility] to use prn...  ~  September 19, 2011:  6wk ROV & she is stable, no new complaints or concerns; she has a painful knot on left wrist that looks like a ganglion cyst but she was checked by drGramig & says it's "tendonitis" & gave her a shot & splint; she has finished her PT for the knee etc...     >Chest is good & denies cough, sputum, dyspnea, or pain; BP controlled on the Lasix 40mg /d as monotherapy & swelling is better...    >Weight is essentially the same & we discussed diet + exercise program; stable on Prav40...    >GI has been stable 7 she is currently not on any GI meds; asked to watch bowels carefully since she takes Percocet for pain & Zolpidem for sleep...          Problem List:  ALLERGIC RHINITIS (ICD-477.9) - on NASONEX,  ASTEPRO, ZYRTEK, & allergy shots... POSITIVE PPD (ICD-795.5) CHRONIC OBSTRUCTIVE ASTHMA UNSPECIFIED (ICD-493.20) - she had +allergy skin testing for dust mites and started on allergy shots per DrESL along w/ SYMBICORT 160- 2spBid, but she doesn't take anything regularly... +smoker but decreased from 1/2 ppd x 67yrs to 1-2 cig/d. ~  2/11: she reports +PPD on pre-employment testing> sent to health dept, CXR- neg, & started on INH 300mg /d x63mo (they never sent records for [place] to review). ~  CXR 2/11 showed borderline heart size, tort Ao, sl incr markings, NAD... ~  subseq CXRs done at health dept for her pos PPD but we don't have the films or reports...  Hx of HYPERTENSION (ICD-401.9) - off Lisinopril/Hct (Hx ACE reaction) & taking LASIX 40mg  as needed for swelling... BP=112/86 today and denies HA, fatigue, visual changes, CP, palipit, dizziness, syncope, dyspnea, edema, etc... ~  EKG 6/12 showed SBrady w/ rate= 55, otherw WNL, NAD...  CAD (ICD-414.00) - known non-obstructive disease & atypic CP... on ASA 325mg /d, STATIN (Prav40), & off ACE. ~  cath 2/05 showed 20-60% obstructions in all 3 vessels... good LVF... ~  NuclearStressTest 3/09 was negative- no ischemia or infarction, EF=68%... ~  Eval for recurrent CP- EKG showed NSR, NAD;  2DEcho showed mild focal basal septal hypertrophy, norm wall motion w/ EF= 55-65%, mild DD & PAsys= 37;  Myoview NEG- no scar, no ischemia, EF=64%... ~  ROV w/ DrCrenshaw 7/11 reviewed- continue ASA/ Statin/ smoking cessation/ monitor BP & Chol.  VENOUS INSUFFICIENCY (ICD-459.81) - she follows a low salt diet... +LASIX 40mg /d for swelling... ~  1/10: we discussed no salt, Lasix20, elevation, support hose.  HYPERCHOLESTEROLEMIA (ICD-272.0) - on PRAVASTATIN 40mg /d now + diet efforts. ~  FLP 5/08 on Cres5 showed TChol 115, TG 83, HDL 50, LDL 49... tolerating well and taking med regularly. ~  FLP 3/09 on Cres5 showed TChol 126, TG 104, HDL 56, LDL 49... rec- same. ~  FLP  3/10 on Cres5 showed TChol 152, TG 97, HDL 77, LDL 55 ~  FLP 12/10 on Cres5 showed TChol 127, TG 100, HDL 52, LDL 55 ~  7/11:  pt requests change to less expensive statin> try PRAVASTATIN40 ~  FLP 10/11 on Prav40 showed TChol 143, TG 125, HDL 51, LDL 67... Continue same. ~  FLP 6/12 on Prav40 showed TChol 143, TG 145, HDL 61, LDL 53  OBESITY (ICD-278.00) - unable to diet effectively and get weight down...  ~  weight 5/10 =  238#, 5\' 5"  tall,  BMI= 40... we discussed diet and exercise program... again! ~  weight 1/11 = 228# ~  weight 7/11 = 232# ~  weight 10/11 = 230# ~  Weight 6/12 = 232# ~  Weight 10/12 = 234#  GERD (ICD-530.81) - EGD 7/05 by DrStark w/ HH- supposed to be on PPI (Omeprazole 20mg ) & H2 blocker (Pepcid 20mg ) at bedtime... ~  1/10: she stopped above meds & is recommended to restart Rx. ~  1/11:  still not taking acid suppression meds "I use them as needed"  COLONIC POLYPS (ICD-211.3) - last colonoscopy was 12/03 showing several 3-80mm polyps (hyperplastic)...  HX OF GALLSTONE (ICD-V12.79) - seen on CT 7/06 and referred to [facility]- eval by DrCornett and being followed...  DEGENERATIVE JOINT DISEASE (ICD-715.90) - followed by DrBeane for ortho... Right knee arthroscopy 1/09 without help... s/p right TKR 06/12/08 by Tora Perches... 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 she is off the Etodolac & takes PERCOCET Prn... ~  1/11: now complaining of left knee pain & will f/u w/ DrBeane... ~  6/12:  DrBeane plans left TKR soon... OK for surg... ~  7/12: s/p left TKR per DrBeane & went to [facility] for rehab...  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   Past Surgical History  Procedure  Date  . Abdominal hysterectomy   . Cspine surgery 1993    for arnold-chiari malformation  . Right knee arthroscopy 12/2007    Dr. Shelle Iron  . 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 Shelle Iron    Outpatient Encounter Prescriptions as of 09/19/2011  Medication Sig Dispense Refill  . aspirin 325 MG tablet Take 325 mg by mouth daily.        . budesonide-formoterol (SYMBICORT) 160-4.5 MCG/ACT inhaler Inhale 2 puffs into the lungs 2 (two) times daily.        . furosemide (LASIX) 40 MG tablet Take 1 tablet (40 mg total) by mouth daily.  30 tablet  5  . mometasone (NASONEX) 50 MCG/ACT nasal spray Place 2 sprays into the nose 2 (two) times daily.        . Multiple Vitamin (MULTIVITAMIN) capsule Take 1 capsule by mouth daily.        Marland Kitchen oxyCODONE-acetaminophen (PERCOCET) 5-325 MG per tablet Take 1-2 tablets every 4 hours as needed for pain       . potassium chloride SA (K-DUR,KLOR-CON) 20 MEQ tablet Take 20 mEq by mouth daily.        . pravastatin (PRAVACHOL) 40 MG tablet Take 1 tablet (40 mg total) by mouth daily.  30 tablet  11  . zolpidem (AMBIEN) 10 MG tablet Take 1 tablet by mouth daily.      Marland Kitchen DISCONTD: Azelastine HCl (ASTEPRO) 0.15 % SOLN Place 2 sprays into the nose daily.        Marland Kitchen DISCONTD: HYDROcodone-acetaminophen (NORCO) 5-325 MG per tablet Take 1 tablet by mouth every 6 (six) hours as needed. As needed for pain       . DISCONTD: methocarbamol (ROBAXIN) 500 MG tablet Take 500 mg by mouth 4 (four) times daily.          No Known Allergies   Current Medications, Allergies, Past Medical History, Past Surgical History, Family  History, and Social History were reviewed in [employer] record.    Review of Systems         See HPI - all other systems neg except as noted... The patient complains of dyspnea on exertion & gait abn.  The patient denies anorexia, fever, weight loss, weight gain, vision loss, decreased hearing, hoarseness, chest pain, syncope, worsening edema,  prolonged cough, headaches, hemoptysis, abdominal pain, melena, hematochezia, severe indigestion/heartburn, hematuria, incontinence, muscle weakness, suspicious skin lesions, transient blindness, depression, unusual weight change, abnormal bleeding, enlarged lymph nodes, and angioedema.     Objective:   Physical Exam      WD, Overweight, 70 y/o BF in NAD... GENERAL:  Alert & oriented; pleasant & cooperative... HEENT:  [place]/AT, EOM-wnl, PERRLA, EACs-clear, TMs-wnl, NOSE-clear, THROAT-clear & wnl, no lesions seen; VOICE- sl hoarse. NECK:  Supple w/ decrROM; no JVD; normal carotid impulses w/o bruits; no thyromegaly or nodules palpated; no lymphadenopathy. CHEST:  Clear to P & A; without wheezes/ rales/ or rhonchi heard... HEART:  Regular Rhythm, gr 1/6 SEM without rubs or gallops... ABDOMEN:  Obese, soft & nontender; normal bowel sounds; no organomegaly or masses detected. EXT:  s/p right TKR, mod arthritic changes; scattered  varicose veins/ +venous insuffic/ 1-2+ edema... NEURO:  CN's intact; no focal neuro deficits... DERM:  No lesions noted; no rash etc...   Assessment & Plan:   Hx Asthma, ex-smoker, hx pos PPD- treated...  Stable on Symbicort which she uses as needed along w/ her Zyrtek, Astepro, Nasonex for her allergies;  CXRs have been clear, NAD...  HBP>  BP controlled on diet + Lasix Rx...  CAD>  Denies CP, angina, etc;  On ASA, statin, off cigs, etc...  Ven Insuffic>  On sodium restriction, elevation, support hose; we will incr the LASIX to 40mg /d...  CHOL>  Well controlled on her Statin med but what happened betw Ortho hosp & NH disch? We had her on Prav40 w/ good labs but not on hosp formulary & the ortho PA didn't pay attn to med reconciliation & wrote for Crestor5; who knows what happened in the NH? Now she should restart our prev therapy ==> PRAVASTATIN 40mg /d...  OBESITY>  Needs better diet & hopefully incr exerc after the knee surg...  GI>  GERD, Polyps, Gallstone>  follwed by DrStark & stable on Omep20 but uses prn only...  S/P left TKR 7/12 by DrBeane>  Course complic by bleed into left calf; now stable & improving w/ Pt etc...  Other medical issues as noted.Marland KitchenMarland Kitchen

## 2011-09-20 ENCOUNTER — Encounter: Payer: Self-pay | Admitting: Pulmonary Disease

## 2011-09-23 ENCOUNTER — Other Ambulatory Visit (INDEPENDENT_AMBULATORY_CARE_PROVIDER_SITE_OTHER): Payer: Medicare Other | Admitting: *Deleted

## 2011-09-23 DIAGNOSIS — I83893 Varicose veins of bilateral lower extremities with other complications: Secondary | ICD-10-CM

## 2011-09-26 ENCOUNTER — Encounter: Payer: Self-pay | Admitting: Vascular Surgery

## 2011-09-29 ENCOUNTER — Encounter: Payer: Self-pay | Admitting: Vascular Surgery

## 2011-09-29 ENCOUNTER — Ambulatory Visit (INDEPENDENT_AMBULATORY_CARE_PROVIDER_SITE_OTHER): Payer: Medicare Other | Admitting: Vascular Surgery

## 2011-09-29 VITALS — BP 116/82 | HR 85 | Resp 16 | Ht 65.0 in | Wt 235.0 lb

## 2011-09-29 DIAGNOSIS — I83893 Varicose veins of bilateral lower extremities with other complications: Secondary | ICD-10-CM

## 2011-09-29 NOTE — Progress Notes (Signed)
Subjective:     Patient ID: [name], female   DOB: February 14, 1941, 70 y.o.   MRN: [id number]  HPI 70 year old female was referred by Dr. Shelle Iron for severe venous insufficiency of the left leg with a history of localized thrombophlebitis. The patient had a total knee replacement by Dr. Shelle Iron 06/05/2011 P. postoperatively she developed what sounds like an area of localized thrombophlebitis in the left lateral calf. She was treated with Coumadin for a short period of time and has been on aspirin therapy since then. She does not have a history of DVT, stasis ulcers, bleeding, or thrombophlebitis in the right leg. She has been having significant burning pain with aching and throbbing in the left thigh and calf which have worsened over the last 3 months. She has tried along with elastic compression stockings but they make her legs hurt more. He elevates the legs when she can but does not take daily pain medicine.   Past Medical History  Diagnosis Date  . Allergic rhinitis   . Positive PPD   . COPD (chronic obstructive pulmonary disease)   . Hypertension   . CAD (coronary artery disease)   . Venous insufficiency   . Hypercholesteremia   . Obesity   . GERD (gastroesophageal reflux disease)   . Hx of colonic polyps   . DJD (degenerative joint disease)   . Arnold-Chiari malformation   . History of absence seizures   . Reflex sympathetic dystrophy   . Varicose veins     History  Substance Use Topics  . Smoking status: Current Some Day Smoker    Types: Cigarettes  . Smokeless tobacco: Never Used   Comment: 1 pack per week  . Alcohol Use: No    History reviewed. No pertinent family history.  No Known Allergies  Current outpatient prescriptions:aspirin 325 MG tablet, Take 325 mg by mouth daily.  , Disp: , Rfl: ;  budesonide-formoterol (SYMBICORT) 160-4.5 MCG/ACT inhaler, Inhale 2 puffs into the lungs 2 (two) times daily.  , Disp: , Rfl: ;  furosemide (LASIX) 40 MG tablet, Take 1 tablet (40 mg  total) by mouth daily., Disp: 30 tablet, Rfl: 5;  mometasone (NASONEX) 50 MCG/ACT nasal spray, Place 2 sprays into the nose 2 (two) times daily.  , Disp: , Rfl:  Multiple Vitamin (MULTIVITAMIN) capsule, Take 1 capsule by mouth daily.  , Disp: , Rfl: ;  oxyCODONE-acetaminophen (PERCOCET) 5-325 MG per tablet, Take 1-2 tablets every 4 hours as needed for pain , Disp: , Rfl: ;  potassium chloride SA (K-DUR,KLOR-CON) 20 MEQ tablet, Take 1 tablet (20 mEq total) by mouth daily., Disp: 30 tablet, Rfl: 5;  pravastatin (PRAVACHOL) 40 MG tablet, Take 1 tablet (40 mg total) by mouth daily., Disp: 30 tablet, Rfl: 11 zolpidem (AMBIEN) 10 MG tablet, Take 1 tablet by mouth daily., Disp: , Rfl:   BP 116/82  Pulse 85  Resp 16  Ht 5\' 5"  (1.651 m)  Wt 235 lb (106.595 kg)  BMI 39.11 kg/m2  Body mass index is 39.11 kg/(m^2).       Review of Systems she denies chest pain but does have dyspnea on exertion. Is not ambulate long distances. She does have a history of reflux esophagitis as well as seizure disorders. Also multiple joint complaints and knee and back. All other systems negative complete review of system    Objective:   Physical Exam blood pressure 116/82 heart rate 85 respirations 16 General she is an obese female is in no apparent stress  alert and oriented x3 HEENT normal for age Lungs no rhonchi or wheezing Cardiovascular regular rhythm no murmurs Abdomen obese no masses Neurologic normal Joints free of major deformities evidence of bilateral total knee replacements Skin free of rashes Lower extremity reveals 3+ femoral and dorsalis pedis pulses bilaterally. She has bulging varicosities in the left lateral calf and the distal lateral thigh. There are multiple spider and reticular veins in the thigh and calf area extending to the ankle. Left leg has 1-2+ edema. Right leg is free of edema with no overt processes.  Today I ordered a left leg venous duplex exam which are reviewed and interpreted.  There is mild reflux in the deep system but no DVT. She does have gross reflux throughout the left great saphenous vein from the junction down to the mid and distal thigh which supply these varicosities and left lateral calf    Assessment:    severe venous insufficiency left leg with gross reflux left great saphenous vein and no DVT This is causing severe pain and affecting her daily living. Also has caused thrombophlebitis in the left leg    Plan:     #1 lung elastic compression stockings 20-30 mm gradient #2 elevate legs as much as possible #3 ibuprofen on a regular basis for pain management #4 patient to return in 3 months if there has not been significant improvement she should be considered for laser inflation left great saphenous vein

## 2011-09-30 NOTE — Procedures (Unsigned)
LOWER EXTREMITY VENOUS REFLUX EXAM  INDICATION:  Left varicose vein.  EXAM:  Using color-flow imaging and pulse Doppler spectral analysis, the right and left common femoral, femoral, popliteal, posterior tibial, great and small saphenous veins were evaluated.  There is evidence suggesting deep venous insufficiency in the left lower extremity.  The right and left saphenofemoral junction is not competent with reflux of >500 milliseconds.  The left GSV is not competent with reflux of >500 milliseconds with caliber as described below.   The right and left proximal small saphenous vein demonstrates competency.  GSV Diameter (used if found to be incompetent only)                                                   Right     Left Proximal Greater Saphenous Vein                   0.63 cm   0.74 cm Proximal-to-mid-thigh                             cm        0.62 cm Mid thigh                                         cm        0.90 cm Mid-distal thigh                                  cm        cm Distal thigh                                      cm        0.71 cm Knee                                              cm        0.56 cm  IMPRESSION: 1. The left great saphenous vein is not competent with reflux of >500     milliseconds. 2. The left great saphenous vein is tortuous. 3. The left deep venous system is not competent with reflux of >500     milliseconds at the level of the common femoral vein. 4. The right and left small saphenous vein is competent.  ___________________________________________ Quita Skye. Hart Rochester, M.D.  EM/MEDQ  D:  09/23/2011  T:  09/23/2011  Job:  [id number]

## 2011-10-06 ENCOUNTER — Encounter (INDEPENDENT_AMBULATORY_CARE_PROVIDER_SITE_OTHER): Payer: Medicare Other

## 2011-10-06 DIAGNOSIS — I83893 Varicose veins of bilateral lower extremities with other complications: Secondary | ICD-10-CM

## 2011-11-03 ENCOUNTER — Encounter: Payer: Medicare Other | Admitting: Surgery

## 2011-11-03 ENCOUNTER — Other Ambulatory Visit: Payer: Medicare Other

## 2011-12-09 ENCOUNTER — Encounter: Payer: Self-pay | Admitting: Cardiology

## 2011-12-09 ENCOUNTER — Ambulatory Visit (INDEPENDENT_AMBULATORY_CARE_PROVIDER_SITE_OTHER): Payer: Medicare Other | Admitting: Cardiology

## 2011-12-09 DIAGNOSIS — F17200 Nicotine dependence, unspecified, uncomplicated: Secondary | ICD-10-CM

## 2011-12-09 DIAGNOSIS — I10 Essential (primary) hypertension: Secondary | ICD-10-CM

## 2011-12-09 DIAGNOSIS — I2510 Atherosclerotic heart disease of native coronary artery without angina pectoris: Secondary | ICD-10-CM

## 2011-12-09 DIAGNOSIS — I498 Other specified cardiac arrhythmias: Secondary | ICD-10-CM

## 2011-12-09 DIAGNOSIS — E7800 Pure hypercholesterolemia, unspecified: Secondary | ICD-10-CM

## 2011-12-09 NOTE — Patient Instructions (Signed)
Your physician wants you to follow-up in: ONE YEAR You will receive a reminder letter in the mail two months in advance. If you don't receive a letter, please call our office to schedule the follow-up appointment.  

## 2011-12-09 NOTE — Assessment & Plan Note (Signed)
Asymptomatic. No further therapy at this point.

## 2011-12-09 NOTE — Assessment & Plan Note (Signed)
Continue present medications. Potassium and renal function monitored by primary care. 

## 2011-12-09 NOTE — Assessment & Plan Note (Signed)
Patient counseled on discontinuing. 

## 2011-12-09 NOTE — Assessment & Plan Note (Signed)
Continue statin. Lipids and liver monitor by primary care. 

## 2011-12-09 NOTE — Assessment & Plan Note (Signed)
Continue aspirin and statin. 

## 2011-12-09 NOTE — Progress Notes (Signed)
HPI: Ms. Marte is a pleasant female who has a history of coronary disease. She had a cardiac catheterization on January 18, 2004.  At that time she was found to have mild irregularities in the left main.  There was a 40% stenosis in the proximal LAD and a distal 30% lesion.  The first diagonal had a 30-40% proximal lesion.  The ostium of the LAD by intravascular ultrasound had a luminal area of 3.9 mm squared and percent stenosis 60%.  She had nonobstructive disease in the left circumflex and right coronary.  Her LV function was normal.  Her last Myoview was in Dec 2010 and showed no significant abnormality and an ejection fraction was 64%. An echocardiogram was also performed in December of 2010 and revealed normal LV function and mild left atrial enlargement. I last saw her in July of 2011. Since then the patient has dyspnea with more extreme activities but not with routine activities. It is relieved with rest. It is not associated with chest pain. There is no orthopnea, PND or pedal edema. There is no syncope or palpitations. There is no exertional chest pain.   Current Outpatient Prescriptions  Medication Sig Dispense Refill  . aspirin 81 MG tablet Take 81 mg by mouth daily.        . budesonide-formoterol (SYMBICORT) 160-4.5 MCG/ACT inhaler Inhale 2 puffs into the lungs 2 (two) times daily.        . furosemide (LASIX) 40 MG tablet Take 1 tablet (40 mg total) by mouth daily.  30 tablet  5  . mometasone (NASONEX) 50 MCG/ACT nasal spray Place 2 sprays into the nose 2 (two) times daily.        . Multiple Vitamin (MULTIVITAMIN) capsule Take 1 capsule by mouth daily.        Marland Kitchen oxyCODONE-acetaminophen (PERCOCET) 5-325 MG per tablet Take 1-2 tablets every 4 hours as needed for pain       . potassium chloride SA (K-DUR,KLOR-CON) 20 MEQ tablet Take 1 tablet (20 mEq total) by mouth daily.  30 tablet  5  . pravastatin (PRAVACHOL) 40 MG tablet Take 1 tablet (40 mg total) by mouth daily.  30 tablet  11  .  zolpidem (AMBIEN) 10 MG tablet Take 1 tablet by mouth daily.         Past Medical History  Diagnosis Date  . Allergic rhinitis   . Positive PPD   . COPD (chronic obstructive pulmonary disease)   . Hypertension   . CAD (coronary artery disease)   . Venous insufficiency   . Hypercholesteremia   . Obesity   . GERD (gastroesophageal reflux disease)   . Hx of colonic polyps   . DJD (degenerative joint disease)   . Arnold-Chiari malformation   . History of absence seizures   . Reflex sympathetic dystrophy   . Varicose veins     Past Surgical History  Procedure Date  . Abdominal hysterectomy   . Cspine surgery 1993    for arnold-chiari malformation  . Right knee arthroscopy 12/2007    Dr. Shelle Iron  . 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 Shelle Iron  . Joint replacement 06/05/11    Left total knee replacement    History   Social History  . Marital Status: Widowed    Spouse Name: N/A    Number of Children: 2  . Years of Education: N/A   Occupational History  . retired    Social History Main Topics  . Smoking status:  Current Some Day Smoker    Types: Cigarettes  . Smokeless tobacco: Never Used   Comment: 1 pack per week  . Alcohol Use: No  . Drug Use: No  . Sexually Active: Not on file   Other Topics Concern  . Not on file   Social History Narrative  . No narrative on file    ROS: Left knee pain but no fevers or chills, productive cough, hemoptysis, dysphasia, odynophagia, melena, hematochezia, dysuria, hematuria, rash, seizure activity, orthopnea, PND, pedal edema, claudication. Remaining systems are negative.  Physical Exam: Well-developed obese in no acute distress.  Skin is warm and dry.  HEENT is normal.  Neck is supple. No thyromegaly.  Chest is clear to auscultation with normal expansion.  Cardiovascular exam is regular rate and rhythm.  Abdominal exam nontender or distended. No masses palpated. Extremities show trace edema. neuro grossly intact  ECG  sinus bradycardia at a rate of 49. Nonspecific ST changes.

## 2011-12-31 IMAGING — CR DG CHEST 2V
2 series · 2 of 2 positions shown · non-contrast
Comparison: 01/15/2010

CLINICAL DATA: Shortness of breath, preoperative exam prior to left
total knee arthroplasty

CHEST - 2 VIEW

[w chest pa]
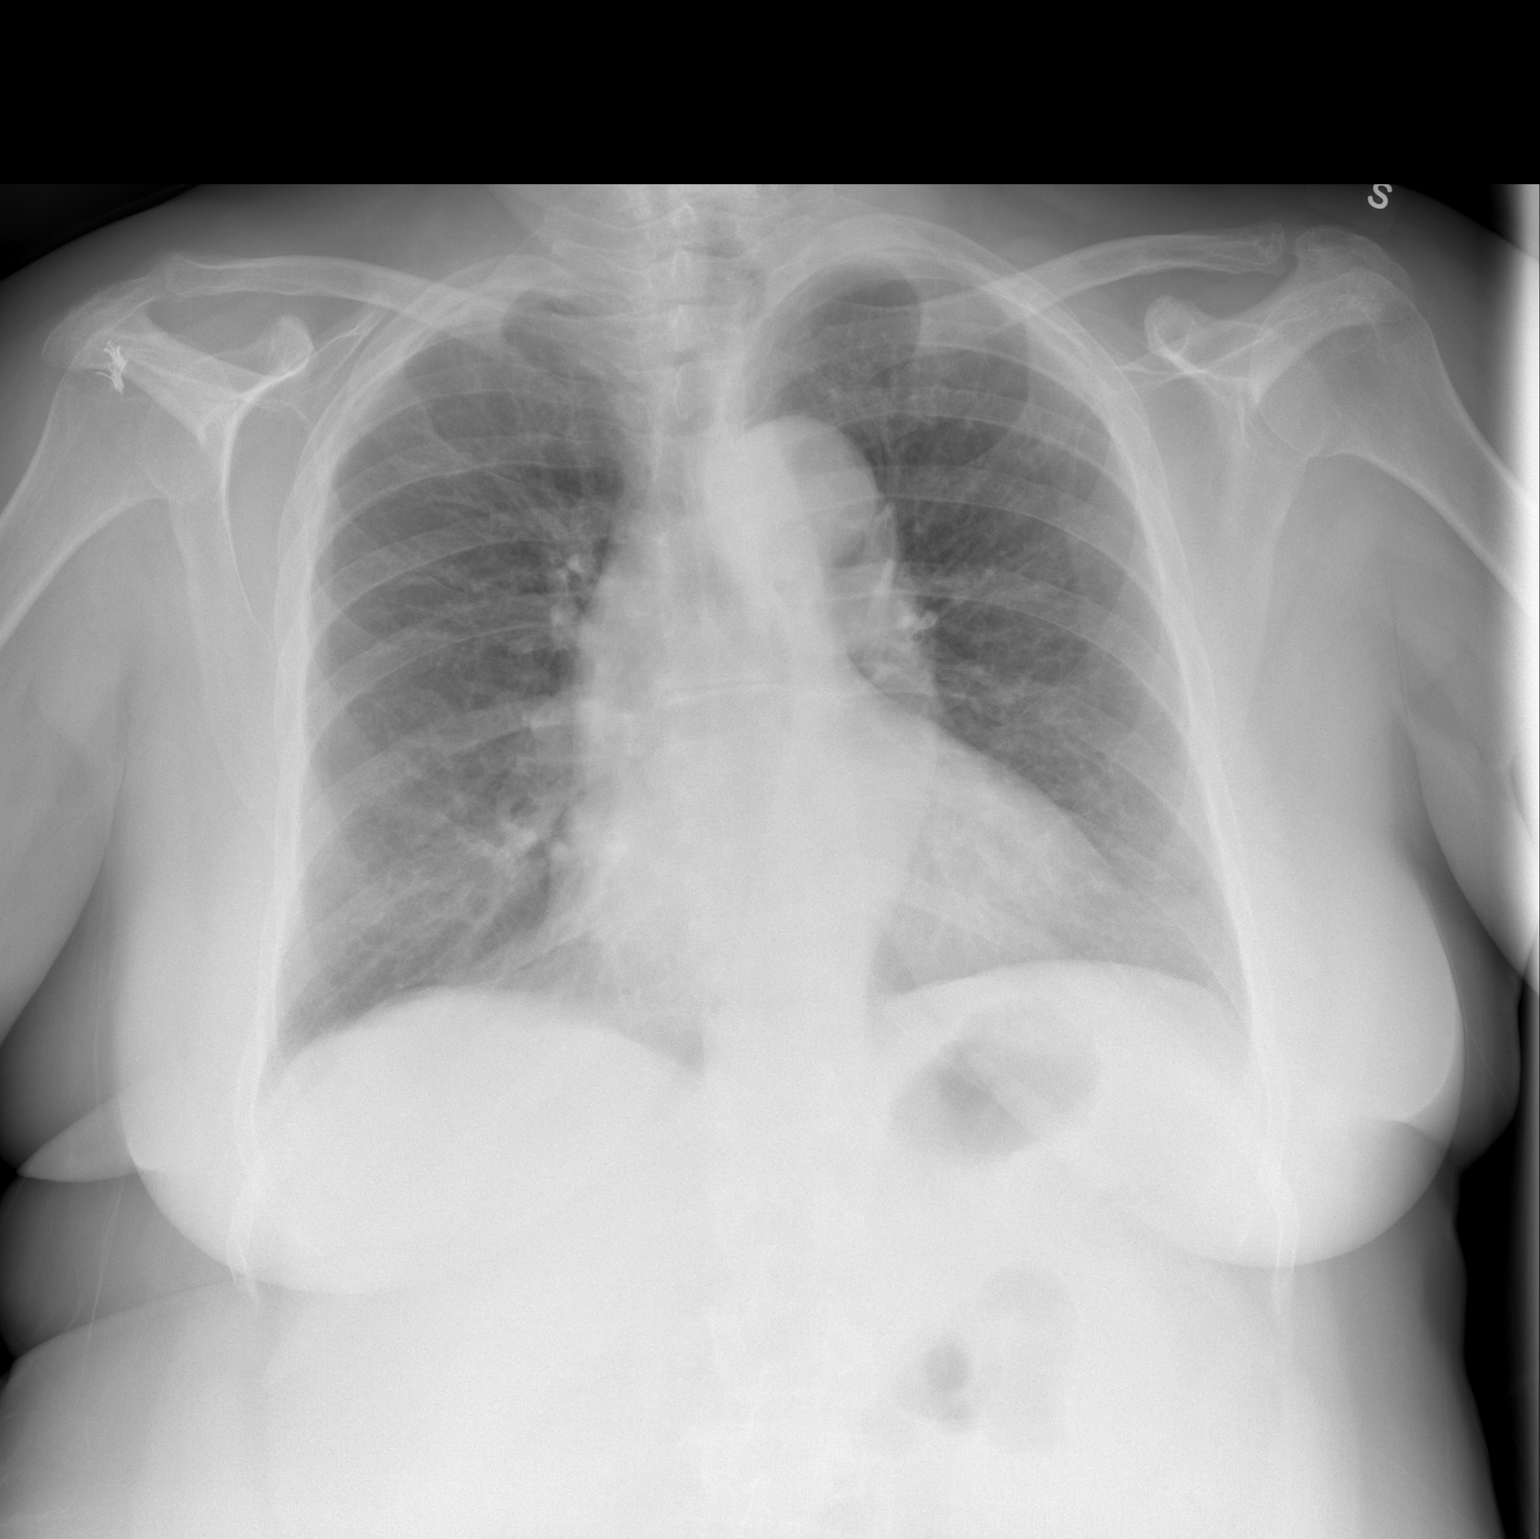

[w chest lat]
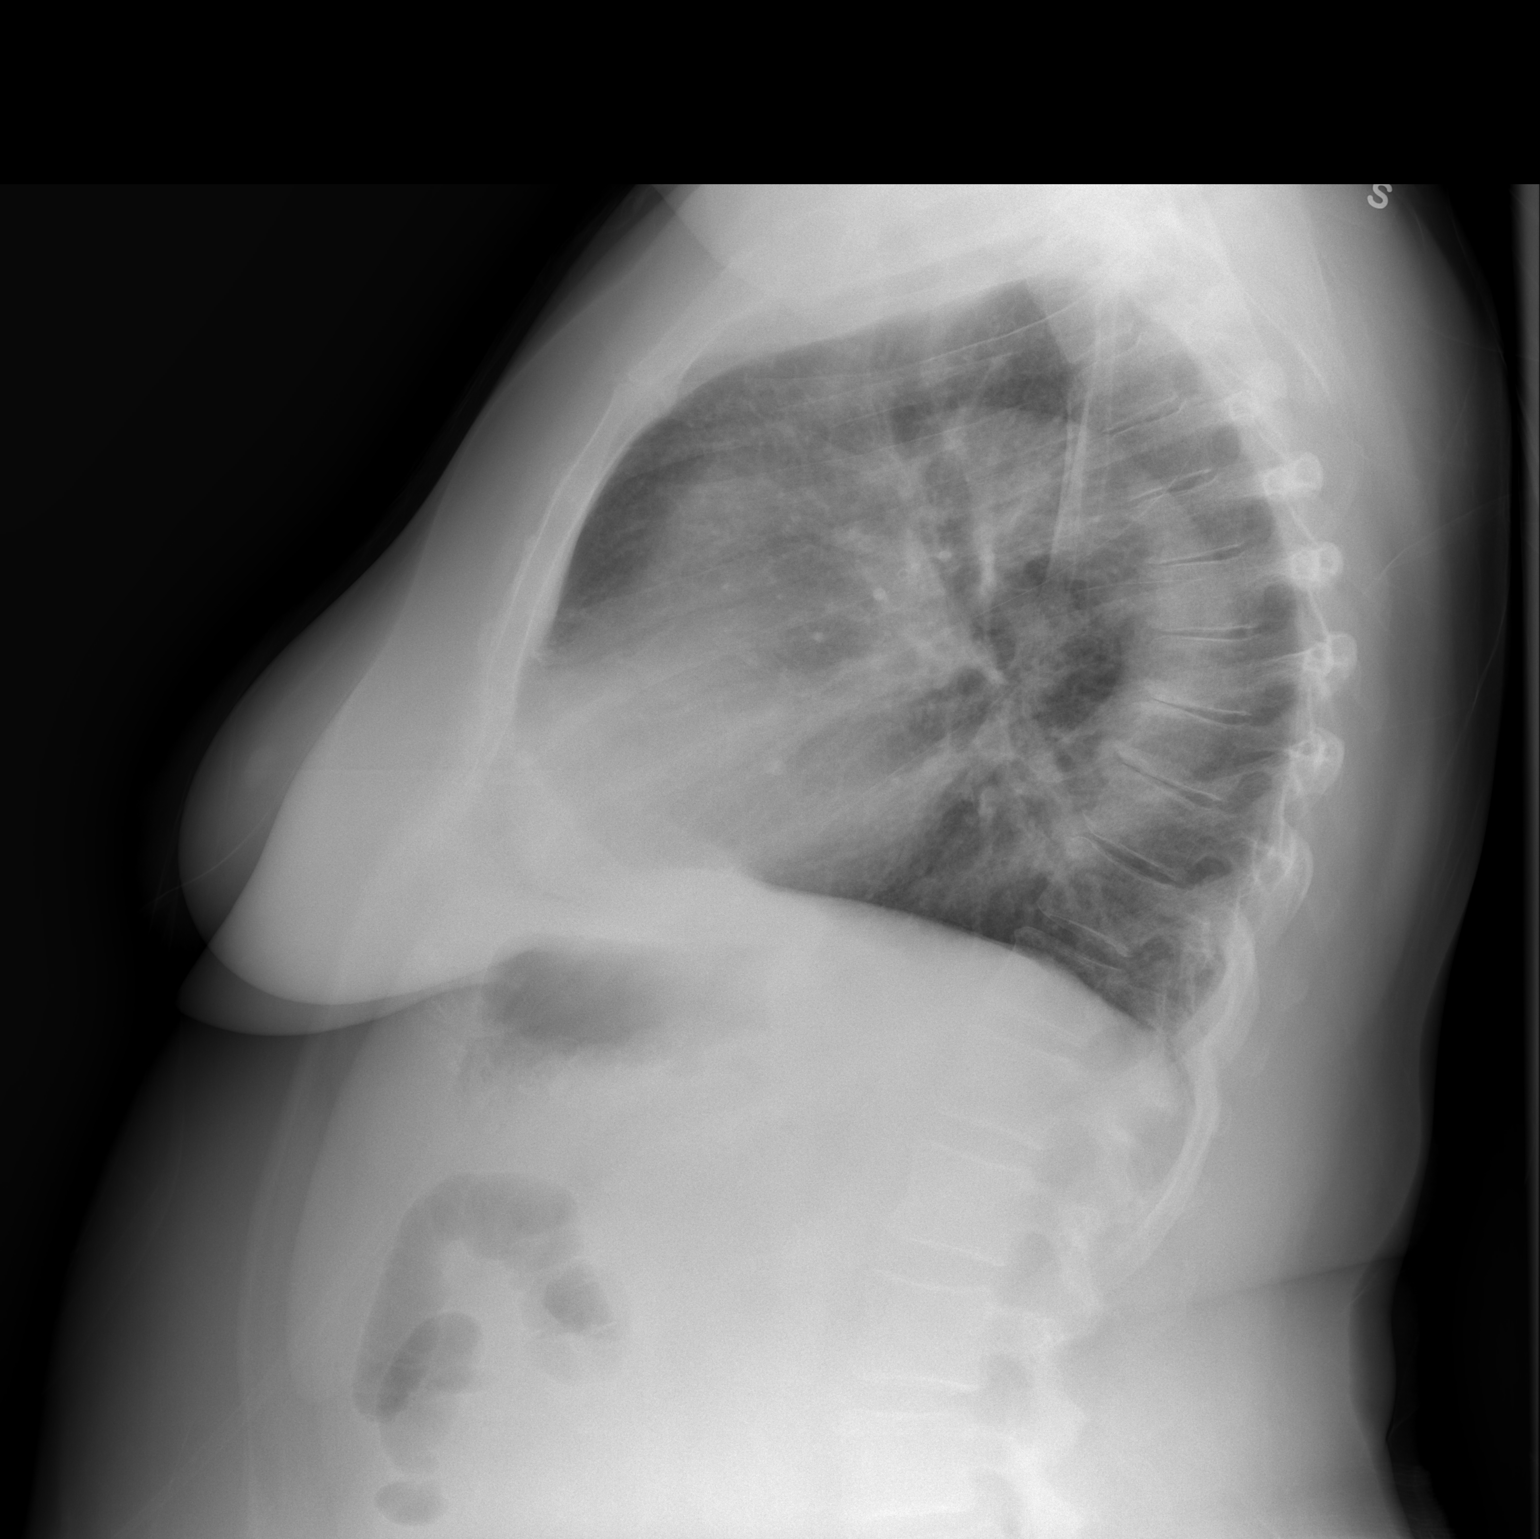

[2 of 2 positions shown; findings below may reference images not displayed]

FINDINGS: Aorta is ectatic and unfolded.  Heart size upper limits
of normal.  Lungs are grossly clear.  No pleural effusion.
Evidence of prior right humeral bone anchor placement noted.  No
acute osseous finding.
IMPRESSION: No acute cardiopulmonary process.

## 2012-01-05 ENCOUNTER — Encounter: Payer: Self-pay | Admitting: Vascular Surgery

## 2012-01-06 ENCOUNTER — Encounter: Payer: Self-pay | Admitting: Vascular Surgery

## 2012-01-06 ENCOUNTER — Ambulatory Visit (INDEPENDENT_AMBULATORY_CARE_PROVIDER_SITE_OTHER): Payer: Medicare Other | Admitting: Vascular Surgery

## 2012-01-06 VITALS — BP 148/87 | HR 66 | Resp 18 | Ht 65.0 in | Wt 237.0 lb

## 2012-01-06 DIAGNOSIS — I83893 Varicose veins of bilateral lower extremities with other complications: Secondary | ICD-10-CM | POA: Insufficient documentation

## 2012-01-06 NOTE — Progress Notes (Signed)
Subjective:     Patient ID: [name], female   DOB: [birth year], 71 y.o.   MRN: [id number]  HPI this 71 year old female returns for further followup regarding her severe venous insufficiency of the left leg with painful varicosities and a history of acute thrombophlebitis a few months ago. She continues to have pain in the area of the resolving thrombophlebitis in her lower thigh and pretibial area in the calf. She has chronic edema in the left ankle. She has 1 long-leg elastic compression stockings 20-30 mm gradient as well as tried ibuprofen and elevation and has had no improvement in her symptomatology. She also has chronic left knee problem and has had a previous knee replacement. She has no symptoms in the contralateral right leg.  Past Medical History  Diagnosis Date  . Allergic rhinitis   . Positive PPD   . COPD (chronic obstructive pulmonary disease)   . Hypertension   . CAD (coronary artery disease)   . Venous insufficiency   . Hypercholesteremia   . Obesity   . GERD (gastroesophageal reflux disease)   . Hx of colonic polyps   . DJD (degenerative joint disease)   . Arnold-Chiari malformation   . History of absence seizures   . Reflex sympathetic dystrophy   . Varicose veins     History  Substance Use Topics  . Smoking status: Current Some Day Smoker    Types: Cigarettes  . Smokeless tobacco: Never Used   Comment: 1 pack per week  . Alcohol Use: No    History reviewed. No pertinent family history.  No Known Allergies  Current outpatient prescriptions:aspirin 81 MG tablet, Take 81 mg by mouth daily.  , Disp: , Rfl: ;  budesonide-formoterol (SYMBICORT) 160-4.5 MCG/ACT inhaler, Inhale 2 puffs into the lungs 2 (two) times daily.  , Disp: , Rfl: ;  furosemide (LASIX) 40 MG tablet, Take 1 tablet (40 mg total) by mouth daily., Disp: 30 tablet, Rfl: 5;  mometasone (NASONEX) 50 MCG/ACT nasal spray, Place 2 sprays into the nose 2 (two) times daily.  , Disp: , Rfl:  Multiple  Vitamin (MULTIVITAMIN) capsule, Take 1 capsule by mouth daily.  , Disp: , Rfl: ;  oxyCODONE-acetaminophen (PERCOCET) 5-325 MG per tablet, Take 1-2 tablets every 4 hours as needed for pain , Disp: , Rfl: ;  potassium chloride SA (K-DUR,KLOR-CON) 20 MEQ tablet, Take 1 tablet (20 mEq total) by mouth daily., Disp: 30 tablet, Rfl: 5;  pravastatin (PRAVACHOL) 40 MG tablet, Take 1 tablet (40 mg total) by mouth daily., Disp: 30 tablet, Rfl: 11 zolpidem (AMBIEN) 10 MG tablet, Take 1 tablet by mouth daily., Disp: , Rfl:   BP 148/87  Pulse 66  Resp 18  Ht 5\' 5"  (1.651 m)  Wt 237 lb (107.502 kg)  BMI 39.44 kg/m2  SpO2 100%  Body mass index is 39.44 kg/(m^2).         Review of Systems chest pain, dyspnea on exertion, PND, orthopnea, hemoptysis    Objective:   Physical Exam blood pressure 148/87 heart rate 66 respirations 18 Gen. alert and oriented x3 in no apparent distress Left leg has bulging varicosities in the left distal medial thigh and extending below the knee into the pretibial region and lateral calf distally. There is 1+ edema and early hyperpigmentation. 2+ left dorsalis pedis pulse palpable She has documented gross reflux in the left great saphenous vein supplying these bulging varicosities were then thrombophlebitis occurred. Reflux extends from the mid thigh to the saphenofemoral junction  and there is no DVT    Assessment:     Severe venous insufficiency with reflux left great saphenous vein secondary to valvular incompetence with secondary acute thrombophlebitis 3 months ago now resolving Continues to have symptoms which are affecting her daily living and not responding to conservative measures    Plan:     Patient needs a laser ablation left great saphenous vein and then return in 3 months to see if stab phlebectomy of secondary varicosities will be necessary. We'll proceed with precertification to perform this in the near future

## 2012-01-12 ENCOUNTER — Other Ambulatory Visit: Payer: Self-pay

## 2012-01-12 ENCOUNTER — Emergency Department (HOSPITAL_COMMUNITY): Payer: Medicare Other

## 2012-01-12 ENCOUNTER — Emergency Department (HOSPITAL_COMMUNITY)
Admission: EM | Admit: 2012-01-12 | Discharge: 2012-01-12 | Disposition: A | Discharge status: Home / Self Care | Payer: Medicare Other | Attending: Emergency Medicine | Admitting: Emergency Medicine

## 2012-01-12 ENCOUNTER — Encounter (HOSPITAL_COMMUNITY): Payer: Self-pay | Admitting: *Deleted

## 2012-01-12 DIAGNOSIS — I2510 Atherosclerotic heart disease of native coronary artery without angina pectoris: Secondary | ICD-10-CM | POA: Insufficient documentation

## 2012-01-12 DIAGNOSIS — I10 Essential (primary) hypertension: Secondary | ICD-10-CM | POA: Insufficient documentation

## 2012-01-12 DIAGNOSIS — K219 Gastro-esophageal reflux disease without esophagitis: Secondary | ICD-10-CM | POA: Insufficient documentation

## 2012-01-12 DIAGNOSIS — J4489 Other specified chronic obstructive pulmonary disease: Secondary | ICD-10-CM | POA: Insufficient documentation

## 2012-01-12 DIAGNOSIS — F17200 Nicotine dependence, unspecified, uncomplicated: Secondary | ICD-10-CM | POA: Insufficient documentation

## 2012-01-12 DIAGNOSIS — R059 Cough, unspecified: Secondary | ICD-10-CM | POA: Insufficient documentation

## 2012-01-12 DIAGNOSIS — E669 Obesity, unspecified: Secondary | ICD-10-CM | POA: Insufficient documentation

## 2012-01-12 DIAGNOSIS — J449 Chronic obstructive pulmonary disease, unspecified: Secondary | ICD-10-CM

## 2012-01-12 DIAGNOSIS — R062 Wheezing: Secondary | ICD-10-CM | POA: Insufficient documentation

## 2012-01-12 DIAGNOSIS — R0602 Shortness of breath: Secondary | ICD-10-CM | POA: Insufficient documentation

## 2012-01-12 DIAGNOSIS — E7800 Pure hypercholesterolemia, unspecified: Secondary | ICD-10-CM | POA: Insufficient documentation

## 2012-01-12 DIAGNOSIS — R05 Cough: Secondary | ICD-10-CM | POA: Insufficient documentation

## 2012-01-12 LAB — CBC
HCT: 35.4 % — ABNORMAL LOW (ref 36.0–46.0)
Hemoglobin: 11.3 g/dL — ABNORMAL LOW (ref 12.0–15.0)
MCH: 26.8 pg (ref 26.0–34.0)
MCHC: 31.9 g/dL (ref 30.0–36.0)
MCV: 84.1 fL (ref 78.0–100.0)
Platelets: 201 10*3/uL (ref 150–400)
RBC: 4.21 MIL/uL (ref 3.87–5.11)
RDW: 14.4 % (ref 11.5–15.5)
WBC: 5.3 10*3/uL (ref 4.0–10.5)

## 2012-01-12 LAB — POCT I-STAT, CHEM 8
BUN: 12 mg/dL (ref 6–23)
Calcium, Ion: 1.2 mmol/L (ref 1.12–1.32)
Chloride: 106 mEq/L (ref 96–112)
Creatinine, Ser: 0.7 mg/dL (ref 0.50–1.10)
Glucose, Bld: 98 mg/dL (ref 70–99)
HCT: 34 % — ABNORMAL LOW (ref 36.0–46.0)
Hemoglobin: 11.6 g/dL — ABNORMAL LOW (ref 12.0–15.0)
Potassium: 3.9 mEq/L (ref 3.5–5.1)
Sodium: 142 mEq/L (ref 135–145)
TCO2: 27 mmol/L (ref 0–100)

## 2012-01-12 LAB — DIFFERENTIAL
Basophils Absolute: 0 10*3/uL (ref 0.0–0.1)
Basophils Relative: 0 % (ref 0–1)
Eosinophils Absolute: 0.2 10*3/uL (ref 0.0–0.7)
Eosinophils Relative: 4 % (ref 0–5)
Lymphocytes Relative: 44 % (ref 12–46)
Lymphs Abs: 2.3 10*3/uL (ref 0.7–4.0)
Monocytes Absolute: 0.4 10*3/uL (ref 0.1–1.0)
Monocytes Relative: 8 % (ref 3–12)
Neutro Abs: 2.3 10*3/uL (ref 1.7–7.7)
Neutrophils Relative %: 44 % (ref 43–77)

## 2012-01-12 LAB — POCT I-STAT TROPONIN I: Troponin i, poc: 0.01 ng/mL (ref 0.00–0.08)

## 2012-01-12 MED ORDER — IOHEXOL 350 MG/ML SOLN
100.0000 mL | Freq: Once | INTRAVENOUS | Status: AC | PRN
  Administered 2012-01-12: 100 mL via INTRAVENOUS

## 2012-01-12 MED ORDER — ALBUTEROL SULFATE (5 MG/ML) 0.5% IN NEBU
5.0000 mg | INHALATION_SOLUTION | Freq: Once | RESPIRATORY_TRACT | Status: AC
  Administered 2012-01-12: 5 mg via RESPIRATORY_TRACT
  Filled 2012-01-12: qty 1

## 2012-01-12 MED ORDER — METHYLPREDNISOLONE SODIUM SUCC 125 MG IJ SOLR
125.0000 mg | Freq: Once | INTRAMUSCULAR | Status: AC
  Administered 2012-01-12: 125 mg via INTRAVENOUS

## 2012-01-12 MED ORDER — DOXYCYCLINE HYCLATE 100 MG PO CAPS: 100.0000 mg | ORAL_CAPSULE | Freq: Two times a day (BID) | ORAL | Status: AC

## 2012-01-12 MED ORDER — PREDNISONE 50 MG PO TABS: 50.0000 mg | ORAL_TABLET | Freq: Every day | ORAL | Status: DC

## 2012-01-12 MED ORDER — METHYLPREDNISOLONE SODIUM SUCC 125 MG IJ SOLR
INTRAMUSCULAR | Status: AC
  Filled 2012-01-12: qty 2

## 2012-01-12 MED ORDER — IPRATROPIUM BROMIDE 0.02 % IN SOLN
0.5000 mg | Freq: Once | RESPIRATORY_TRACT | Status: AC
  Administered 2012-01-12: 0.5 mg via RESPIRATORY_TRACT
  Filled 2012-01-12: qty 2.5

## 2012-01-12 NOTE — ED Notes (Signed)
Productive cough white sputum

## 2012-01-12 NOTE — ED Provider Notes (Signed)
History     CSN: [id number]  Arrival date & time 01/12/12  4098   First MD Initiated Contact with Patient 01/12/12 0411      Chief Complaint  Patient presents with  . Cough    (Consider location/radiation/quality/duration/timing/severity/associated sxs/prior treatment) Patient is a 71 y.o. female presenting with cough. The history is provided by the patient. No language interpreter was used.  Cough This is a new problem. The current episode started more than 1 week ago. The problem occurs constantly. The problem has not changed since onset.The cough is non-productive. There has been no fever. Associated symptoms include shortness of breath and wheezing. Pertinent negatives include no chills, no sweats, no weight loss, no headaches, no rhinorrhea and no sore throat. She has tried nothing for the symptoms. The treatment provided no relief. She is a smoker. Her past medical history is significant for COPD.  Had some back pain with deep breath.  No CP.  No n/v/d.    Past Medical History  Diagnosis Date  . Allergic rhinitis   . Positive PPD   . COPD (chronic obstructive pulmonary disease)   . Hypertension   . CAD (coronary artery disease)   . Venous insufficiency   . Hypercholesteremia   . Obesity   . GERD (gastroesophageal reflux disease)   . Hx of colonic polyps   . DJD (degenerative joint disease)   . Arnold-Chiari malformation   . History of absence seizures   . Reflex sympathetic dystrophy   . Varicose veins     Past Surgical History  Procedure Date  . Abdominal hysterectomy   . Cspine surgery 1993    for arnold-chiari malformation  . Right knee arthroscopy 12/2007    Dr. Shelle Iron  . 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 Shelle Iron  . Joint replacement 06/05/11    Left total knee replacement    History reviewed. No pertinent family history.  History  Substance Use Topics  . Smoking status: Current Some Day Smoker    Types: Cigarettes  . Smokeless tobacco: Never  Used   Comment: 1 pack per week  . Alcohol Use: No    OB History    Grav Para Term Preterm Abortions TAB SAB Ect Mult Living                  Review of Systems  Constitutional: Negative for chills and weight loss.  HENT: Negative for sore throat and rhinorrhea.   Eyes: Negative.   Respiratory: Positive for cough, shortness of breath and wheezing.   Cardiovascular: Negative.   Gastrointestinal: Negative.   Genitourinary: Negative.   Musculoskeletal: Negative.   Skin: Negative.   Neurological: Negative for headaches.  Hematological: Negative.   Psychiatric/Behavioral: Negative.     Allergies  Review of patient's allergies indicates no known allergies.  Home Medications   Current Outpatient Rx  Name Route Sig Dispense Refill  . ALBUTEROL SULFATE HFA 108 (90 BASE) MCG/ACT IN AERS Inhalation Inhale 2 puffs into the lungs every 4 (four) hours as needed. For wheeze or shortness of breath    . ASPIRIN 81 MG PO TABS Oral Take 81 mg by mouth daily.      . BUDESONIDE-FORMOTEROL FUMARATE 160-4.5 MCG/ACT IN AERO Inhalation Inhale 2 puffs into the lungs 2 (two) times daily.      . FUROSEMIDE 40 MG PO TABS Oral Take 1 tablet (40 mg total) by mouth daily. 30 tablet 5  . MOMETASONE FUROATE 50 MCG/ACT NA SUSP Nasal  Place 2 sprays into the nose 2 (two) times daily.      . MULTIVITAMINS PO CAPS Oral Take 1 capsule by mouth daily.      Marland Kitchen OVER THE COUNTER MEDICATION Oral Take 30 mLs by mouth at bedtime as needed. OTC cough and cold medication with Tylenol 650 mg, dextromorphan  30 mg, doxylamine 12.5 per 30 mL    . OXYCODONE-ACETAMINOPHEN 5-325 MG PO TABS  Take 1-2 tablets every 4 hours as needed for pain     . PHENYLEPH-DOXYLAMINE-DM-APAP 5-6.25-10-325 MG PO CAPS Oral Take 2 tablets by mouth 3 (three) times daily as needed. For cold symptoms    . POTASSIUM CHLORIDE CRYS ER 20 MEQ PO TBCR Oral Take 1 tablet (20 mEq total) by mouth daily. 30 tablet 5  . PRAVASTATIN SODIUM 40 MG PO TABS Oral Take  1 tablet (40 mg total) by mouth daily. 30 tablet 11  . ZOLPIDEM TARTRATE 10 MG PO TABS Oral Take 1 tablet by mouth daily.      BP 117/56  Pulse 74  Temp(Src) 98.1 F (36.7 C) (Oral)  Resp 24  SpO2 94%  Physical Exam  Constitutional: She is oriented to person, place, and time. She appears well-developed and well-nourished.  HENT:  Head: Normocephalic and atraumatic.  Mouth/Throat: Oropharynx is clear and moist.  Eyes: Conjunctivae are normal. Pupils are equal, round, and reactive to light.  Neck: Normal range of motion. Neck supple.  Cardiovascular: Normal rate and regular rhythm.   Pulmonary/Chest: Effort normal. She has wheezes.  Abdominal: Soft. Bowel sounds are normal. There is no tenderness. There is no rebound and no guarding.  Musculoskeletal: Normal range of motion. She exhibits edema.  Neurological: She is alert and oriented to person, place, and time.  Skin: Skin is warm and dry.  Psychiatric: She has a normal mood and affect.    ED Course  Procedures (including critical care time)  Labs Reviewed  CBC - Abnormal; Notable for the following:    Hemoglobin 11.3 (*)    HCT 35.4 (*)    All other components within normal limits  POCT I-STAT, CHEM 8 - Abnormal; Notable for the following:    Hemoglobin 11.6 (*)    HCT 34.0 (*)    All other components within normal limits  DIFFERENTIAL  POCT I-STAT TROPONIN I   Dg Chest 2 View  01/12/2012  *RADIOLOGY REPORT*  Clinical Data: Cough, congestion, shortness of breath  CHEST - 2 VIEW  Comparison: 05/28/2011  Findings: Shallow inspiration.  Mild cardiac enlargement and pulmonary vascular congestion without edema.  No blunting of costophrenic angles.  No focal airspace consolidation. Degenerative changes in the spine.  Tortuous and ectatic aorta. Postoperative changes in the right shoulder.  IMPRESSION: Mild cardiac enlargement and pulmonary vascular congestion without edema.  No focal consolidation.  Original Report Authenticated  By: Marlon Pel, M.D.     No diagnosis found.    MDM   Date: 01/12/2012  Rate: 65  Rhythm: normal sinus rhythm  QRS Axis: normal  Intervals: normal  ST/T Wave abnormalities: nonspecific ST changes  Conduction Disutrbances:none  Narrative Interpretation:   Old EKG Reviewed: none available     Per ACEP and AHA only one troponin indicated  If CTA negative discharge on medication if positive admit  Dally Oshel K Sabra Sessler-Rasch, MD 01/12/12 [phone]

## 2012-01-12 NOTE — ED Provider Notes (Signed)
Medical screening examination/treatment/procedure(s) were performed by non-physician practitioner and as supervising physician I was immediately available for consultation/collaboration.  Mylo Choi K Akhil Piscopo-Rasch, MD 01/12/12 2340 

## 2012-01-12 NOTE — ED Notes (Signed)
Awaiting ct angio study

## 2012-01-12 NOTE — ED Notes (Signed)
Returned from ct 

## 2012-01-12 NOTE — ED Notes (Signed)
Patient transported to CT 

## 2012-01-12 NOTE — ED Notes (Signed)
Pt unable to go to CT d/t limited IV access, new IV started ad CT called

## 2012-01-12 NOTE — ED Provider Notes (Signed)
Pt with cough for a couple of days, white to clear sputum. Has hx of COPD, followed by pulmonologist. In cdu waiting on CT angio to r/oPE. CT is back negative for PE. Brochitic changes. Possible CAD, which pt is aware of. Will d/c home. Will follow up closely with PCP.  Lottie Mussel, PA 01/12/12 1046

## 2012-01-12 NOTE — ED Notes (Signed)
She has had a cold since Monday.  Coughing and some chest congestion with wheezing

## 2012-01-12 NOTE — ED Notes (Addendum)
NEW AND OLD EKG GIVEN TO DR Preston Fleeting

## 2012-01-14 ENCOUNTER — Ambulatory Visit (INDEPENDENT_AMBULATORY_CARE_PROVIDER_SITE_OTHER): Payer: Medicare Other | Admitting: Adult Health

## 2012-01-14 ENCOUNTER — Encounter: Payer: Self-pay | Admitting: Adult Health

## 2012-01-14 DIAGNOSIS — J449 Chronic obstructive pulmonary disease, unspecified: Secondary | ICD-10-CM

## 2012-01-14 MED ORDER — BUDESONIDE-FORMOTEROL FUMARATE 160-4.5 MCG/ACT IN AERO: 2.0000 | INHALATION_SPRAY | Freq: Two times a day (BID) | RESPIRATORY_TRACT | Status: DC

## 2012-01-14 MED ORDER — MOMETASONE FUROATE 50 MCG/ACT NA SUSP: 2.0000 | Freq: Every day | NASAL | Status: DC

## 2012-01-14 NOTE — Patient Instructions (Signed)
Finish Doxycycline and Prednisone as directed.  Mucinex DM Twice daily  As needed  Cough/congestion  Fluids and rest  Restart Symbiocrt 2 puffs Twice daily   No smoking  follow up Dr. Kriste Basque  As planned and As needed   Please contact office for sooner follow up if symptoms do not improve or worsen or seek emergency care

## 2012-01-14 NOTE — Assessment & Plan Note (Signed)
Flare -resolving  Albuterol neb in office  Plan:  Finish Doxycycline and Prednisone as directed.  Mucinex DM Twice daily  As needed  Cough/congestion  Fluids and rest  Restart Symbiocrt 2 puffs Twice daily   No smoking  follow up Dr. Kriste Basque  As planned and As needed   Please contact office for sooner follow up if symptoms do not improve or worsen or seek emergency care

## 2012-01-14 NOTE — Progress Notes (Signed)
Subjective:    Patient ID: [name], female    DOB: [birth year], 71 y.o.   MRN: [id number]  HPI 71 y/o BF hx of : Allergies, HBP, CAD, Chol, Obesity, etc... she is also followed by DrCrenshaw for Cards, & DrESL for allergies...  ~  June 10, 2010:  she apparently applied for job at [employer] routine PPD was pos- sent to the health dept Tb clinic & CXR was neg> started on INH (rec for 7101month course- started 2/11 til 11/11) & they do labs ?monthly... we have not received any records & will request these to be sent to [place] for review (never received)... her breathing has been good- no new complaints; BP controlled on meds;  denies CP/ angina but still too sedentary & weight is UP 4#;  asking for new chol med since Cres5 is "too$$"- we discussed trial PRAVACHOL40... finally she notes DrBeane indicated that she needs left TKR as well but she is holding off...  ~  September 20, 2010:  we still haven't received any records from health dept- they check her monthly & give her the INH & she is due to complete the course 11/11... she denies cough, sputum, hemoptysis, CP, ch in DOB/ DOE... states she quit smoking last night!!!  BP remains controlled on med;  she saw DrCrenshaw 7/11- doing well, no changes made, f/u 33yr;  needs to ret fasting to check FLP on Prav40... OK Flu shot today.  ~  May 07, 2011:  71mo ROV & pre-op clearance for TKR by DrBeane> clinically stable on minimal meds (Prav40, ASA, MVI- all others just prn);  She finished 101mo INH for pos PPD 11/11 & CXR from health dept were apparently OK but we don't have the films or reports;  she denies cough, sputum, hemoptysis, CP, palpit, dizzy, ch in DOE, edema, etc... BP stable on diet Rx & using the Lasix just as needed for swelling in legs; FLP looks good on Prav40;  Weight is a prob but can't exerc due to DJD; taking Norco prn knee pain from DrBeane... eval here is OK for surg...  ~  July 15, 2011:  1mo ROV & she had her left TKR 7/12 by Tora Perches, then  went to NH for rehab; she had to be readmit 1 week later due to leg swelling & bruising "due to the blood thinner" she says (bleed into the calf per Ortho) ==> changed to SQ heparin, the ASA 325mg /d;  Labs from [facility] showed Hg= 9.6 on 06/23/11... She has Methocarbamol for muscle spasms & Oxycodone for pain...    >Her breathing remains stable on the Symbicort & she denies much cough, sputum, wheezing, or SOB...    >BP remains controlled on her Lasix 20mg /d> BP= 124/82 & she denies CP, palpit, SOB; he LE edema is stable now post op...    >She is supposed to be on Crestor 5mg /d but it is unclear what she went to the NH on & what they disch her on (prev Prav40); she is reminded to bring all meds to the OVs; her weight remains elev at 234# today...    >Other medical problems as noted...  ~  August 07, 2011:  3wk ROV & she is stable> getting PT [facility] weekly at [facility] & doing it at home the other 5d;  Her wt is up 2# to 236# & BP= 150/94 on Lasix 20mg /d & K20 daily... She notes some insomnia related to leg throbbing & persist edema so we  decided to incr the LASIX to 40mg d & wrote new prescription for [facility] to use prn...  ~  September 19, 2011:  6wk ROV & she is stable, no new complaints or concerns; she has a painful knot on left wrist that looks like a ganglion cyst but she was checked by drGramig & says it's "tendonitis" & gave her a shot & splint; she has finished her PT for the knee etc...     >Chest is good & denies cough, sputum, dyspnea, or pain; BP controlled on the Lasix 40mg /d as monotherapy & swelling is better...    >Weight is essentially the same & we discussed diet + exercise program; stable on Prav40...    >GI has been stable 7 she is currently not on any GI meds; asked to watch bowels carefully since she takes Percocet for pain & Zolpidem for sleep...  01/14/2012 ER follow up  Pt returns for ER follow up . Seen for bronchitis on 2/11 for cough and congestion . CT chest  showed no PE, mild diffuse bronchial wall thickening , most severe in RML. She was treated with Doxycycline and steroid taper. She is not taking her symibcort. She has agreed to restart. Has few days left of abx and steroid taper.  Still smoking. None for last 2 days. We discussed smoking cesstation.  No hemotpysis or chest pain. No edema         Problem List:  ALLERGIC RHINITIS (ICD-477.9) - on NASONEX, ASTEPRO, ZYRTEK, & allergy shots... POSITIVE PPD (ICD-795.5) CHRONIC OBSTRUCTIVE ASTHMA UNSPECIFIED (ICD-493.20) - she had +allergy skin testing for dust mites and started on allergy shots per DrESL along w/ SYMBICORT 160- 2spBid, but she doesn't take anything regularly... +smoker but decreased from 1/2 ppd x 86yrs to 1-2 cig/d. ~  2/11: she reports +PPD on pre-employment testing> sent to health dept, CXR- neg, & started on INH 300mg /d x61mo (they never sent records for [place] to review). ~  CXR 2/11 showed borderline heart size, tort Ao, sl incr markings, NAD... ~  subseq CXRs done at health dept for her pos PPD but we don't have the films or reports...  Hx of HYPERTENSION (ICD-401.9) - off Lisinopril/Hct (Hx ACE reaction) & taking LASIX 40mg  as needed for swelling...  ~  EKG 6/12 showed SBrady w/ rate= 55, otherw WNL, NAD...  CAD (ICD-414.00) - known non-obstructive disease & atypic CP... on ASA 325mg /d, STATIN (Prav40), & off ACE. ~  cath 2/05 showed 20-60% obstructions in all 3 vessels... good LVF... ~  NuclearStressTest 3/09 was negative- no ischemia or infarction, EF=68%... ~  Eval for recurrent CP- EKG showed NSR, NAD;  2DEcho showed mild focal basal septal hypertrophy, norm wall motion w/ EF= 55-65%, mild DD & PAsys= 37;  Myoview NEG- no scar, no ischemia, EF=64%... ~  ROV w/ DrCrenshaw 7/11 reviewed- continue ASA/ Statin/ smoking cessation/ monitor BP & Chol.  VENOUS INSUFFICIENCY (ICD-459.81) - she follows a low salt diet... +LASIX 40mg /d for swelling... ~  1/10: we discussed no salt,  Lasix20, elevation, support hose.  HYPERCHOLESTEROLEMIA (ICD-272.0) - on PRAVASTATIN 40mg /d now + diet efforts. ~  FLP 5/08 on Cres5 showed TChol 115, TG 83, HDL 50, LDL 49... tolerating well and taking med regularly. ~  FLP 3/09 on Cres5 showed TChol 126, TG 104, HDL 56, LDL 49... rec- same. ~  FLP 3/10 on Cres5 showed TChol 152, TG 97, HDL 77, LDL 55 ~  FLP 12/10 on Cres5 showed TChol 127, TG 100, HDL 52, LDL 55 ~  7/11:  pt requests change to less expensive statin> try PRAVASTATIN40 ~  FLP 10/11 on Prav40 showed TChol 143, TG 125, HDL 51, LDL 67... Continue same. ~  FLP 6/12 on Prav40 showed TChol 143, TG 145, HDL 61, LDL 53  OBESITY (ICD-278.00) - unable to diet effectively and get weight down...  ~  weight 5/10 = 238#, 5\' 5"  tall,  BMI= 40... we discussed diet and exercise program... again! ~  weight 1/11 = 228# ~  weight 7/11 = 232# ~  weight 10/11 = 230# ~  Weight 6/12 = 232# ~  Weight 10/12 = 234#  GERD (ICD-530.81) - EGD 7/05 by DrStark w/ HH- supposed to be on PPI (Omeprazole 20mg ) & H2 blocker (Pepcid 20mg ) at bedtime... ~  1/10: she stopped above meds & is recommended to restart Rx. ~  1/11:  still not taking acid suppression meds "I use them as needed"  COLONIC POLYPS (ICD-211.3) - last colonoscopy was 12/03 showing several 3-68mm polyps (hyperplastic)...  HX OF GALLSTONE (ICD-V12.79) - seen on CT 7/06 and referred to [facility]- eval by DrCornett and being followed...  DEGENERATIVE JOINT DISEASE (ICD-715.90) - followed by DrBeane for ortho... Right knee arthroscopy 1/09 without help... s/p right TKR 06/12/08 by Tora Perches... 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 she is off the Etodolac & takes PERCOCET Prn... ~  1/11: now complaining of left knee pain & will f/u w/ DrBeane... ~  6/12:  DrBeane plans left TKR soon... OK for surg... ~  7/12: s/p left TKR per DrBeane & went to [facility] for rehab...  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   Past Surgical History  Procedure Date  . Abdominal hysterectomy   . Cspine surgery 1993    for arnold-chiari malformation  . Right knee arthroscopy 12/2007    Dr. Shelle Iron  . 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 Shelle Iron  . Joint replacement 06/05/11    Left total knee replacement    Outpatient Encounter Prescriptions as of 01/14/2012  Medication Sig Dispense Refill  . albuterol (PROVENTIL HFA;VENTOLIN HFA) 108 (90 BASE) MCG/ACT inhaler Inhale 2 puffs into the lungs every 4 (four) hours as needed. For wheeze or shortness of breath      . aspirin 81 MG tablet Take 81 mg by mouth daily.        . budesonide-formoterol (SYMBICORT) 160-4.5 MCG/ACT inhaler Inhale 2 puffs into the lungs 2 (two) times daily.        Marland Kitchen doxycycline (VIBRAMYCIN) 100 MG capsule Take 1 capsule (100 mg total) by mouth 2 (two) times daily.  14 capsule  0  . furosemide (LASIX) 40 MG tablet Take 1 tablet (40 mg total) by mouth daily.  30 tablet  5  . mometasone (NASONEX) 50 MCG/ACT nasal spray Place 2 sprays into the nose 2 (two) times daily.        . Multiple Vitamin (MULTIVITAMIN) capsule Take 1 capsule by mouth daily.        Marland Kitchen OVER THE COUNTER MEDICATION Take 30 mLs by mouth at bedtime as needed. OTC cough and cold medication with Tylenol 650 mg, dextromorphan  30 mg, doxylamine 12.5 per 30 mL      . Phenyleph-Doxylamine-DM-APAP (ALKA-SELTZER PLS ALLERGY &  CGH) 5-6.25-10-325 MG CAPS Take 2 tablets by mouth 3 (three) times daily as needed. For cold symptoms      . potassium chloride SA (K-DUR,KLOR-CON) 20 MEQ tablet Take 1 tablet (20 mEq total) by mouth daily.  30 tablet  5  . pravastatin (PRAVACHOL) 40 MG tablet Take 1 tablet (40 mg total) by mouth  daily.  30 tablet  11  . predniSONE (DELTASONE) 50 MG tablet Take 1 tablet (50 mg total) by mouth daily.  5 tablet  0  . zolpidem (AMBIEN) 10 MG tablet Take 1 tablet by mouth daily.      Marland Kitchen oxyCODONE-acetaminophen (PERCOCET) 5-325 MG per tablet Take 1-2 tablets every 4 hours as needed for pain         No Known Allergies   Current Medications, Allergies, Past Medical History, Past Surgical History, Family History, and Social History were reviewed in [employer] record.    Review of Systems        Constitutional:   No  weight loss, night sweats,  Fevers, chills, +fatigue, or  lassitude.  HEENT:   No headaches,  Difficulty swallowing,  Tooth/dental problems, or  Sore throat,                No sneezing, itching, ear ache,  +nasal congestion, post nasal drip,   CV:  No chest pain,  Orthopnea, PND, swelling in lower extremities, anasarca, dizziness, palpitations, syncope.   GI  No heartburn, indigestion, abdominal pain, nausea, vomiting, diarrhea, change in bowel habits, loss of appetite, bloody stools.   Resp:  No coughing up of blood.     No chest wall deformity  Skin: no rash or lesions.  GU: no dysuria, change in color of urine, no urgency or frequency.  No flank pain, no hematuria   MS:  No joint pain or swelling.  No decreased range of motion.  No back pain.  Psych:  No change in mood or affect. No depression or anxiety.  No memory loss.       Objective:   Physical Exam      WD, Overweight, 70 y/o BF in NAD... GENERAL:  Alert & oriented; pleasant & cooperative... HEENT:  [place]/AT, EOM-wnl, PERRLA, EACs-clear, TMs-wnl, NOSE-clear, THROAT-clear & wnl, no lesions seen; VOICE- sl hoarse. NECK:  Supple w/ decrROM; no JVD; normal carotid impulses w/o bruits; no thyromegaly or nodules palpated; no lymphadenopathy. CHEST:  Coarse BS w/ no wheezing  HEART:  Regular Rhythm, gr 1/6 SEM without rubs or gallops... ABDOMEN:  Obese, soft & nontender; normal bowel  sounds; no organomegaly or masses detected. EXT:  s/p right TKR, mod arthritic changes; scattered  varicose veins/ +venous insuffic/ 1+ edema... NEURO:   no focal neuro deficits... DERM:  No lesions noted; no rash etc...   Assessment & Plan:

## 2012-01-20 ENCOUNTER — Other Ambulatory Visit: Payer: Self-pay | Admitting: *Deleted

## 2012-01-20 DIAGNOSIS — I83893 Varicose veins of bilateral lower extremities with other complications: Secondary | ICD-10-CM

## 2012-01-21 ENCOUNTER — Ambulatory Visit: Payer: Medicare Other | Admitting: Pulmonary Disease

## 2012-01-22 ENCOUNTER — Telehealth: Payer: Self-pay | Admitting: Pulmonary Disease

## 2012-01-22 NOTE — Telephone Encounter (Signed)
Per SN- pt needs an ov with SN. He had opening at 12 noon tomorrow and so appt scheduled for then. ED advised sooner if her symptoms worsen.

## 2012-01-22 NOTE — Telephone Encounter (Signed)
ATC patient, line rang multiple times with NA and no option to LM.  WCB.  Per pt chart, pt was seen in ED on 2.11.13 and was given doxycycline 100mg  #14 1 po BID and prednisone 50mg  #5.

## 2012-01-22 NOTE — Telephone Encounter (Signed)
Called spoke with patient who reports she is still having hoarseness, throat is dry, "a little bit" of wheezing, prod cough with clear mucus, tightness in chest.  Requesting another abx.  Finished the doxycycline on 2.16.13 and the prednisone on 2.15.13.  Dr Kriste Basque please advise, thanks.  NKDA, [facility].

## 2012-01-22 NOTE — Telephone Encounter (Signed)
Pt returned triage's call.  Holly D Pryor ° °

## 2012-01-23 ENCOUNTER — Ambulatory Visit (INDEPENDENT_AMBULATORY_CARE_PROVIDER_SITE_OTHER): Payer: Medicare Other | Admitting: Pulmonary Disease

## 2012-01-23 ENCOUNTER — Encounter: Payer: Self-pay | Admitting: Pulmonary Disease

## 2012-01-23 VITALS — BP 124/84 | HR 50 | Temp 97.0°F | Ht 65.0 in | Wt 241.4 lb

## 2012-01-23 DIAGNOSIS — I2510 Atherosclerotic heart disease of native coronary artery without angina pectoris: Secondary | ICD-10-CM

## 2012-01-23 DIAGNOSIS — E669 Obesity, unspecified: Secondary | ICD-10-CM

## 2012-01-23 DIAGNOSIS — J449 Chronic obstructive pulmonary disease, unspecified: Secondary | ICD-10-CM

## 2012-01-23 DIAGNOSIS — M1990 Unspecified osteoarthritis, unspecified site: Secondary | ICD-10-CM

## 2012-01-23 DIAGNOSIS — M545 Low back pain, unspecified: Secondary | ICD-10-CM

## 2012-01-23 DIAGNOSIS — K219 Gastro-esophageal reflux disease without esophagitis: Secondary | ICD-10-CM

## 2012-01-23 DIAGNOSIS — I10 Essential (primary) hypertension: Secondary | ICD-10-CM

## 2012-01-23 DIAGNOSIS — J069 Acute upper respiratory infection, unspecified: Secondary | ICD-10-CM

## 2012-01-23 DIAGNOSIS — E7800 Pure hypercholesterolemia, unspecified: Secondary | ICD-10-CM

## 2012-01-23 DIAGNOSIS — I872 Venous insufficiency (chronic) (peripheral): Secondary | ICD-10-CM

## 2012-01-23 MED ORDER — METHYLPREDNISOLONE 4 MG PO TABS: ORAL_TABLET | ORAL | Status: DC

## 2012-01-23 MED ORDER — LEVOFLOXACIN 500 MG PO TABS: 500.0000 mg | ORAL_TABLET | Freq: Every day | ORAL | Status: AC

## 2012-01-23 MED ORDER — HYDROCODONE-ACETAMINOPHEN 5-325 MG PO TABS: 1.0000 | ORAL_TABLET | Freq: Three times a day (TID) | ORAL | Status: AC | PRN

## 2012-01-23 MED ORDER — FIRST-DUKES MOUTHWASH MT SUSP: OROMUCOSAL | Status: DC

## 2012-01-23 MED ORDER — ZOLPIDEM TARTRATE 10 MG PO TABS: 10.0000 mg | ORAL_TABLET | Freq: Every day | ORAL | Status: DC

## 2012-01-23 NOTE — Patient Instructions (Signed)
Today we updated your med list in our EPIC system...    Continue your current medications the same...    We refilled the meds you requested...  We reviewed the details of the recent work up...  For your upper resp tract infection>    Take the Levaquin antibiotic one daily til gone...    Take the Medrol dosepak as directed...    Use the MAGIC MOUTHWASH- one tsp gargle & swallow 4 times daily for your throat symptoms & hoarseness...  Drink plenty of fluids and try the [facility] 2 tabs twice daily for the congestion...  Call for any questions...  Let's plan a follow up visit in 2-3 months.Marland KitchenMarland Kitchen

## 2012-01-23 NOTE — Progress Notes (Signed)
Subjective:    Patient ID: [name], female    DOB: 1941/10/20, 71 y.o.   MRN: [id number]  HPI 71 y/o BF here for a follow up of her mult med problems including: Allergies, HBP, CAD, Chol, Obesity, etc... she is also followed by DrCrenshaw for Cards, & DrESL for allergies...  ~  July 15, 2011:  48mo ROV & she had her left TKR 7/12 by Tora Perches, then went to NH for rehab; she had to be readmit 1 week later due to leg swelling & bruising "due to the blood thinner" she says (bleed into the calf per Ortho) ==> changed to SQ heparin, the ASA 325mg /d;  Labs from [facility] showed Hg= 9.6 on 06/23/11... She has Methocarbamol for muscle spasms & Oxycodone for pain...    >Her breathing remains stable on the Symbicort & she denies much cough, sputum, wheezing, or SOB...    >BP remains controlled on her Lasix 20mg /d> BP= 124/82 & she denies CP, palpit, SOB; he LE edema is stable now post op...    >She is supposed to be on Crestor 5mg /d but it is unclear what she went to the NH on & what they disch her on (prev Prav40); she is reminded to bring all meds to the OVs; her weight remains elev at 234# today...    >Other medical problems as noted...  ~  August 07, 2011:  3wk ROV & she is stable> getting PT twice weekly at [facility] & doing it at home the other 5d;  Her wt is up 2# to 236# & BP= 150/94 on Lasix 20mg /d & K20 daily... She notes some insomnia related to leg throbbing & persist edema so we decided to incr the LASIX to 40mg d & wrote new prescription for [facility] to use prn...  ~  September 19, 2011:  6wk ROV & she is stable, no new complaints or concerns; she has a painful knot on left wrist that looks like a ganglion cyst but she was checked by DrGramig & says it's "tendonitis" & gave her a shot & splint; she has finished her PT for the knee etc...     >Chest is good & denies cough, sputum, dyspnea, or pain; BP controlled on the Lasix 40mg /d as monotherapy & swelling is better...    >Weight  is essentially the same & we discussed diet + exercise program; stable on Prav40...    >GI has been stable & she is currently not on any GI meds; asked to watch bowels carefully since she takes Percocet for pain & Zolpidem for sleep...  ~  January 23, 2012:  61mo ROV & add-on for refractory URI/AB> started weeks ago w/ nasal congestion, drainage, sore throat, hoarseness, cough w/ yellow sput, wheezing, SOB, "tight" etc;  Went to ER 01/12/12 denied f/c/s, no CP, noted +smoker w/ COPD/asthma; CXR showed mild cardiomeg & ectatic Ao, pulm vasc congestion, DJD sp, NAD;  CTA was neg for PE, some bronchial thickening, +atherosclerotic changes in Ao & coronaries;  Labs showed wnl x Hg=11.3 & WBC=5.3;  she was treated w/ Doxy Bid & Pred taper, asked to restart her Symbicort;  Had f/u w/ TP & asked to continue Rx, quit smoking, take the Symbicort 2sp Bid & restart Mucinex...  Now c/o persistent cough, beige sput, & hoarseness> we decided to Rx w/ Levaquin, Medrol Dosepak, MMW;  She wants refills Vicodin & Ambien...    >She saw DrCrenshaw 1/13 for f/u CAD> last Myoview & 2DEcho were inn 2010;  she's been stable on ASA81mg /d, Lasix, KCl, Prav40; she denies angina & DrCrenshaw rec quit smoking, same meds, lose weight...    >She saw [facility] 2/13 for f/u severe ven insuffic left leg w/ pain & chr swelling, bulging varicosities; using compression hose, no salt, elevation, & Ibuprofen w/o improvement;  He plans laser ablation of the GSV & then he'll consider stab phlebectomy of secondary varicosities later...          Problem List:  ALLERGIC RHINITIS (ICD-477.9) > on NASONEX, ASTEPRO, ZYRTEK, & allergy shots... POSITIVE PPD (ICD-795.5) CIGARETTE SMOKER CHRONIC OBSTRUCTIVE ASTHMA UNSPECIFIED (ICD-493.20) - she had +allergy skin testing for dust mites and started on allergy shots per DrESL along w/ SYMBICORT 160- 2spBid, but she doesn't take anything regularly... +smoker but decreased from 1/2 ppd x 68yrs to 1-2 cig/d. ~   2/11: she reports +PPD on pre-employment testing> sent to health dept, CXR- neg, & started on INH 300mg /d x32mo (they never sent records for [place] to review). ~  CXR 2/11 showed borderline heart size, tort Ao, sl incr markings, NAD... ~  subseq CXRs done at health dept for her pos PPD but we don't have the films or reports... ~  2/13: Refractory AB treated via ER w/ Doxy, Pred, restart Symbicort, +Mucinex, etc; we decided upon Levaquin, Medrol, MMW... ~  CXR 2/13 showed mild cardiomeg, ectatic Ao & coronary calcif, DJD sp, NAD; CTAngio was neg for PE, +bronchial wall thickening, NAD...  Hx of HYPERTENSION (ICD-401.9) - off Lisinopril/Hct (Hx ACE reaction) & taking LASIX 40mg  as needed for swelling... BP=124/84 today and denies HA, fatigue, visual changes, CP, palipit, dizziness, syncope, dyspnea, edema, etc... ~  EKG 6/12 showed SBrady w/ rate= 55, otherw WNL, NAD...  CAD (ICD-414.00) - known non-obstructive disease & atypic CP... on ASA 325mg /d, STATIN (Prav40), & off ACE. ~  cath 2/05 showed 20-60% obstructions in all 3 vessels... good LVF... ~  NuclearStressTest 3/09 was negative- no ischemia or infarction, EF=68%... ~  Eval for recurrent CP- EKG showed NSR, NAD;  2DEcho showed mild focal basal septal hypertrophy, norm wall motion w/ EF= 55-65%, mild DD & PAsys= 37;  Myoview NEG- no scar, no ischemia, EF=64%... ~  ROV w/ DrCrenshaw 7/11 reviewed- continue ASA/ Statin/ smoking cessation/ monitor BP & Chol. ~  She saw DrCrenshaw 1/13 for f/u CAD> last Myoview & 2DEcho were in 2010; she's been stable on ASA81mg /d, Lasix, KCl, Prav40; she denies angina & DrCrenshaw rec quit smoking, same meds, lose weight...  VENOUS INSUFFICIENCY (ICD-459.81) - she follows a low salt diet... +LASIX 40mg /d for swelling... ~  1/10: we discussed no salt, Lasix20, elevation, support hose. ~  She was evaluated by [facility] Mould[facility] w/ severe ven insuffic left leg w/ pain & chr swelling, bulging varicosities; using compression  hose, no salt, elevation, & Ibuprofen w/o improvement;  He plans laser ablation of the GSV & then he'll consider stab phlebectomy of secondary varicosities later...  HYPERCHOLESTEROLEMIA (ICD-272.0) - on PRAVASTATIN 40mg /d now + diet efforts. ~  FLP 5/08 on Cres5 showed TChol 115, TG 83, HDL 50, LDL 49... tolerating well and taking med regularly. ~  FLP 3/09 on Cres5 showed TChol 126, TG 104, HDL 56, LDL 49... rec- same. ~  FLP 3/10 on Cres5 showed TChol 152, TG 97, HDL 77, LDL 55 ~  FLP 12/10 on Cres5 showed TChol 127, TG 100, HDL 52, LDL 55 ~  7/11:  pt requests change to less expensive statin> try PRAVASTATIN40 ~  FLP 10/11 on Prav40  showed TChol 143, TG 125, HDL 51, LDL 67... Continue same. ~  FLP 6/12 on Prav40 showed TChol 143, TG 145, HDL 61, LDL 53  OBESITY (ICD-278.00) - unable to diet effectively and get weight down...  ~  weight 5/10 = 238#, 5\' 5"  tall,  BMI= 40... we discussed diet and exercise program... again! ~  weight 1/11 = 228# ~  weight 7/11 = 232# ~  weight 10/11 = 230# ~  Weight 6/12 = 232# ~  Weight 10/12 = 234# ~  Weight 2/13 = 241#  GERD (ICD-530.81) - EGD 7/05 by DrStark w/ HH- supposed to be on PPI (Omeprazole 20mg ) & H2 blocker (Pepcid 20mg ) at bedtime... ~  1/10: she stopped above meds & is recommended to restart Rx. ~  1/11:  still not taking acid suppression meds "I use them as needed"  COLONIC POLYPS (ICD-211.3) - last colonoscopy was 12/03 showing several 3-42mm polyps (hyperplastic)...  HX OF GALLSTONE (ICD-V12.79) - seen on CT 7/06 and referred to [facility]- eval by DrCornett and being followed...  DEGENERATIVE JOINT DISEASE (ICD-715.90) - followed by DrBeane for ortho... Right knee arthroscopy 1/09 without help... s/p right TKR 06/12/08 by Tora Perches... 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 she is off the Etodolac & takes PERCOCET Prn... ~  1/11: now complaining of left knee pain & will f/u w/ DrBeane... ~  6/12:  DrBeane plans left TKR  soon... OK for surg... ~  7/12: s/p left TKR per DrBeane & went to [facility] for rehab...  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   Past Surgical History  Procedure Date  . Abdominal hysterectomy   . Cspine surgery 1993    for arnold-chiari malformation  . Right knee arthroscopy 12/2007    Dr. Shelle Iron  . 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 Shelle Iron  . Joint replacement 06/05/11    Left total knee replacement    Outpatient Encounter Prescriptions as of 01/23/2012  Medication Sig Dispense Refill  . albuterol (PROVENTIL HFA;VENTOLIN HFA) 108 (90 BASE) MCG/ACT inhaler Inhale 2 puffs into the lungs every 4 (four) hours as needed. For wheeze or shortness of breath      . aspirin 81 MG tablet Take 81 mg by mouth daily.        . budesonide-formoterol (SYMBICORT) 160-4.5 MCG/ACT inhaler Inhale 2 puffs into the lungs 2 (two) times daily.  1 Inhaler  5  . furosemide (LASIX) 40 MG tablet Take 1 tablet (40 mg total) by mouth daily.  30 tablet  5  . mometasone (NASONEX) 50 MCG/ACT nasal spray Place 2 sprays into the nose daily.  17 g  5  . Multiple Vitamin (MULTIVITAMIN) capsule Take 1 capsule by mouth daily.        . potassium chloride SA (K-DUR,KLOR-CON) 20 MEQ tablet Take 1 tablet (20 mEq total) by mouth daily.  30 tablet  5  . pravastatin (PRAVACHOL) 40 MG tablet Take 1 tablet (40 mg total) by mouth daily.  30 tablet  11  . zolpidem (AMBIEN) 10 MG tablet Take 1 tablet (10 mg total) by mouth daily.  30 tablet  3  . Diphenhyd-Hydrocort-Nystatin (FIRST-DUKES MOUTHWASH) SUSP 1 teaspoon swish and swallow 4 times a day  120 mL  0  . HYDROcodone-acetaminophen (NORCO) 5-325 MG per tablet Take 1 tablet by mouth 3 (three)  times daily as needed for pain.  90 tablet  3  . levofloxacin (LEVAQUIN) 500 MG tablet Take 1 tablet (500 mg total) by mouth daily.  7 tablet  0  . methylPREDNISolone (MEDROL) 4 MG tablet 6 day pack take as directed  21 tablet  0    No Known Allergies   Current Medications, Allergies, Past Medical History, Past Surgical History, Family History, and Social History were reviewed in [employer] record.    Review of Systems         See HPI - all other systems neg except as noted... The patient complains of dyspnea on exertion & gait abn.  The patient denies anorexia, fever, weight loss, weight gain, vision loss, decreased hearing, hoarseness, chest pain, syncope, worsening edema, prolonged cough, headaches, hemoptysis, abdominal pain, melena, hematochezia, severe indigestion/heartburn, hematuria, incontinence, muscle weakness, suspicious skin lesions, transient blindness, depression, unusual weight change, abnormal bleeding, enlarged lymph nodes, and angioedema.     Objective:   Physical Exam      WD, Overweight, 71 y/o BF in NAD... GENERAL:  Alert & oriented; pleasant & cooperative... HEENT:  [place]/AT, EOM-wnl, PERRLA, EACs-clear, TMs-wnl, NOSE-clear, THROAT-clear & wnl, no lesions seen; VOICE- sl hoarse. NECK:  Supple w/ decrROM; no JVD; normal carotid impulses w/o bruits; no thyromegaly or nodules palpated; no lymphadenopathy. CHEST:  Few scat rhonchi, mild congestion & exp wheezing, no consolidation... HEART:  Regular Rhythm, gr 1/6 SEM without rubs or gallops... ABDOMEN:  Obese, soft & nontender; normal bowel sounds; no organomegaly or masses detected. EXT:  s/p right TKR, mod arthritic changes; +varicose veins/ +venous insuffic/ 1-2+ edema... NEURO:  CN's intact; no focal neuro deficits... DERM:  No lesions noted; no rash etc...  RADIOLOGY DATA:  Reviewed in the EPIC EMR & discussed w/ the patient...  LABORATORY DATA:  Reviewed in the EPIC EMR & discussed w/ the  patient...   Assessment & Plan:   Hx Asthma/ COPD/ AB, smoker (?ex), & hx pos PPD- treated> She's had refractory "attack" & s/o Doxy, Pred, etc; REC to treat w/ Levaquin 500mg /d, Medrol dosepak, Mucinex 2Bid, MMW, etc... Must quit all smoking & stay quit!  If hoarseness persists then refer to ENT...   HBP>  BP controlled on diet + Lasix Rx...  CAD>  Denies CP, angina, etc;  On ASA, statin, off cigs, etc...  Ven Insuffic>  On sodium restriction, elevation, support hose; also on LASIX to 40mg /d & followed by [facility] Moulding [facility] & he plans laser ablation of left GSV...  CHOL>  Well controlled on her Statin med but what happened betw Ortho hosp & NH disch? We had her on Prav40 w/ good labs but not on hosp formulary & the ortho PA didn't pay attn to med reconciliation & wrote for Crestor5; who knows what happened in the NH? Now she should restart our prev therapy ==> PRAVASTATIN 40mg /d...  OBESITY>  Needs better diet & hopefully incr exerc after the knee surg...  GI>  GERD, Polyps, Gallstone> follwed by DrStark & stable on Omep20 but uses prn only...  S/P left TKR 7/12 by DrBeane>  Course complic by bleed into left calf; now stable & improving w/ PT etc...  Other medical issues as noted...   Patient's Medications  New Prescriptions   DIPHENHYD-HYDROCORT-NYSTATIN (FIRST-DUKES MOUTHWASH) SUSP    1 teaspoon swish and swallow 4 times a day   HYDROCODONE-ACETAMINOPHEN (NORCO) 5-325  MG PER TABLET    Take 1 tablet by mouth 3 (three) times daily as needed for pain.   LEVOFLOXACIN (LEVAQUIN) 500 MG TABLET    Take 1 tablet (500 mg total) by mouth daily.   METHYLPREDNISOLONE (MEDROL) 4 MG TABLET    6 day pack take as directed  Previous Medications   ALBUTEROL (PROVENTIL HFA;VENTOLIN HFA) 108 (90 BASE) MCG/ACT INHALER    Inhale 2 puffs into the lungs every 4 (four) hours as needed. For wheeze or shortness of breath   ASPIRIN 81 MG TABLET    Take 81 mg by mouth daily.     BUDESONIDE-FORMOTEROL (SYMBICORT)  160-4.5 MCG/ACT INHALER    Inhale 2 puffs into the lungs 2 (two) times daily.   FUROSEMIDE (LASIX) 40 MG TABLET    Take 1 tablet (40 mg total) by mouth daily.   MOMETASONE (NASONEX) 50 MCG/ACT NASAL SPRAY    Place 2 sprays into the nose daily.   MULTIPLE VITAMIN (MULTIVITAMIN) CAPSULE    Take 1 capsule by mouth daily.     PHENYLEPH-DOXYLAMINE-DM-APAP (ALKA-SELTZER PLS ALLERGY & CGH) 5-6.25-10-325 MG CAPS    Take 2 tablets by mouth 3 (three) times daily as needed. For cold symptoms   POTASSIUM CHLORIDE SA (K-DUR,KLOR-CON) 20 MEQ TABLET    Take 1 tablet (20 mEq total) by mouth daily.   PRAVASTATIN (PRAVACHOL) 40 MG TABLET    Take 1 tablet (40 mg total) by mouth daily.  Modified Medications   Modified Medication Previous Medication   ZOLPIDEM (AMBIEN) 10 MG TABLET zolpidem (AMBIEN) 10 MG tablet      Take 1 tablet (10 mg total) by mouth daily.    Take 1 tablet by mouth daily.  Discontinued Medications   OVER THE COUNTER MEDICATION    Take 30 mLs by mouth at bedtime as needed. OTC cough and cold medication with Tylenol 650 mg, dextromorphan  30 mg, doxylamine 12.5 per 30 mL   OXYCODONE-ACETAMINOPHEN (PERCOCET) 5-325 MG PER TABLET    Take 1-2 tablets every 4 hours as needed for pain    PREDNISONE (DELTASONE) 50 MG TABLET    Take 1 tablet (50 mg total) by mouth daily.

## 2012-02-09 ENCOUNTER — Telehealth: Payer: Self-pay | Admitting: Pulmonary Disease

## 2012-02-09 MED ORDER — AZITHROMYCIN 250 MG PO TABS: ORAL_TABLET | ORAL | Status: AC

## 2012-02-09 NOTE — Telephone Encounter (Signed)
LMTCB

## 2012-02-09 NOTE — Telephone Encounter (Signed)
Spoke with pt. She was seen here last 01/23/12 and txed with medrol and levaquin for acute URI. She states that all of her symptoms have since then resolved, except she is bothered with cough- prod with clear to white sputum. She states this is esp worse at bedtime once she lies down. She is taking robitussin DM and mucinex, wants to know if there is something else SN would rec. Please advise, thanks! No Known Allergies

## 2012-02-09 NOTE — Telephone Encounter (Signed)
Per Dr Kriste Basque - Zpak #1 take as directed - Also make sure she is taking Mucinex 2 po bid with plenty of fluids. - Pt informed of recommendations and med sent to pharmacy.

## 2012-02-25 ENCOUNTER — Ambulatory Visit: Payer: Self-pay | Admitting: Pulmonary Disease

## 2012-02-26 ENCOUNTER — Encounter: Payer: Self-pay | Admitting: Vascular Surgery

## 2012-03-01 ENCOUNTER — Ambulatory Visit (INDEPENDENT_AMBULATORY_CARE_PROVIDER_SITE_OTHER): Payer: Medicare Other | Admitting: Vascular Surgery

## 2012-03-01 ENCOUNTER — Other Ambulatory Visit: Payer: Self-pay | Admitting: Pulmonary Disease

## 2012-03-01 ENCOUNTER — Encounter: Payer: Self-pay | Admitting: Vascular Surgery

## 2012-03-01 VITALS — BP 124/77 | HR 59 | Resp 20 | Ht 65.0 in | Wt 232.0 lb

## 2012-03-01 DIAGNOSIS — I83893 Varicose veins of bilateral lower extremities with other complications: Secondary | ICD-10-CM

## 2012-03-01 MED ORDER — ZOLPIDEM TARTRATE 10 MG PO TABS: 10.0000 mg | ORAL_TABLET | Freq: Every day | ORAL | Status: DC

## 2012-03-01 NOTE — Progress Notes (Signed)
Subjective:     Patient ID: [name], female   DOB: Dec 11, 1940, 71 y.o.   MRN: [id number]  HPI this 71 year old female had laser ablation of the left great saphenous vein performed under local tumescent anesthesia today. She tolerated the procedure well. A total of 1500 J of energy was utilized.  Review of Systems     Objective:   Physical ExamBP 124/77  Pulse 59  Resp 20  Ht 5\' 5"  (1.651 m)  Wt 232 lb (105.235 kg)  BMI 38.61 kg/m2       Assessment:    well tolerated laser ablation left great saphenous vein performed under local tumescent anesthesia    Plan:      return in one week for venous duplex exam to confirm closure left great saphenous vein

## 2012-03-01 NOTE — Progress Notes (Signed)
Laser Ablation Procedure      Date: 03/01/2012    [name] DOB:Nov 11, 1941  Consent signed: Yes  Surgeon:J.D. Hart Rochester  Procedure: Laser Ablation: left Greater Saphenous Vein  BP 124/77  Pulse 59  Resp 20  Ht 5\' 5"  (1.651 m)  Wt 232 lb (105.235 kg)  BMI 38.61 kg/m2  Start time: 11:20    End time: 12:10  Tumescent Anesthesia: 250 cc 0.9% NaCl with 50 cc Lidocaine HCL with 1% Epi and 15 cc 8.4% NaHCO3  Local Anesthesia: 6 cc Lidocaine HCL and NaHCO3 (ratio 2:1)  Pulsed mode:15 Watts 1 Seconds 1 Pulses: Total Pulses:100 Total Energy: 1500 Total Time: 1:40   Patient tolerated procedure well: Yes  Description of Procedure:  After marking the course of the saphenous vein and the secondary varicosities in the standing position, the patient was placed on the operating table in the supine position, and the left leg was prepped and draped in sterile fashion. Local anesthetic was administered, and under ultrasound guidance the saphenous vein was accessed with a micro needle and guide wire; then the micro puncture sheath was placed. A guide wire was inserted to the saphenofemoral junction, followed by a 5 french sheath.  The position of the sheath and then the laser fiber below the junction was confirmed using the ultrasound and visualization of the aiming beam.  Tumescent anesthesia was administered along the course of the saphenous vein using ultrasound guidance. Protective laser glasses were placed on the patient, and the laser was fired at 15 watt pulsed mode advancing 1-2 mm per sec.  For a total of 1500 joules.  A steri strip was applied to the puncture site.  ABD pads and thigh high compression stockings were applied.  Ace wrap bandages were applied over the phlebectomy sites and at the top of the saphenofemoral junction.  Blood loss was less than 15 cc.  The patient ambulated out of the operating room having tolerated the procedure well.

## 2012-03-02 ENCOUNTER — Encounter: Payer: Self-pay | Admitting: Vascular Surgery

## 2012-03-02 ENCOUNTER — Telehealth: Payer: Self-pay | Admitting: *Deleted

## 2012-03-02 NOTE — Telephone Encounter (Signed)
Reached pat at home. No probs. A little sore. Following all instructions. Will see her next week.

## 2012-03-08 ENCOUNTER — Encounter: Payer: Self-pay | Admitting: Vascular Surgery

## 2012-03-09 ENCOUNTER — Ambulatory Visit (INDEPENDENT_AMBULATORY_CARE_PROVIDER_SITE_OTHER): Payer: Medicare Other | Admitting: Vascular Surgery

## 2012-03-09 ENCOUNTER — Encounter: Payer: Self-pay | Admitting: Vascular Surgery

## 2012-03-09 ENCOUNTER — Encounter (INDEPENDENT_AMBULATORY_CARE_PROVIDER_SITE_OTHER): Payer: Medicare Other | Admitting: *Deleted

## 2012-03-09 VITALS — BP 119/77 | HR 58 | Resp 16 | Ht 65.0 in | Wt 232.0 lb

## 2012-03-09 DIAGNOSIS — I8310 Varicose veins of unspecified lower extremity with inflammation: Secondary | ICD-10-CM

## 2012-03-09 DIAGNOSIS — I83893 Varicose veins of bilateral lower extremities with other complications: Secondary | ICD-10-CM

## 2012-03-09 NOTE — Progress Notes (Signed)
Subjective:     Patient ID: [name], female   DOB: 1941-08-19, 71 y.o.   MRN: [id number]  HPI this 71 year old female had laser ablation left great saphenous vein performed one week ago for painful varicosities with distal swelling burning aching and throbbing discomfort the left leg due to valvular incompetence. He states of the left leg feels much better with the burning discomfort having disappeared below the day. She continues to have mild swelling distally. She has had some mild-to-moderate discomfort along the course of the great saphenous vein with the ablation was performed. She's had no chest pain, dyspnea on exertion, PND, orthopnea, or hemoptysis. He is aware elastic compression stocking and taking ibuprofen as instructed.  Past Medical History  Diagnosis Date  . Allergic rhinitis   . Positive PPD   . COPD (chronic obstructive pulmonary disease)   . Hypertension   . CAD (coronary artery disease)   . Venous insufficiency   . Hypercholesteremia   . Obesity   . GERD (gastroesophageal reflux disease)   . Hx of colonic polyps   . DJD (degenerative joint disease)   . Arnold-Chiari malformation   . History of absence seizures   . Reflex sympathetic dystrophy   . Varicose veins     History  Substance Use Topics  . Smoking status: Former Smoker -- 12 years    Types: Cigarettes    Quit date: 01/02/2012  . Smokeless tobacco: Never Used   Comment: 1 pack per week  . Alcohol Use: No    History reviewed. No pertinent family history.  No Known Allergies  Current outpatient prescriptions:albuterol (PROVENTIL HFA;VENTOLIN HFA) 108 (90 BASE) MCG/ACT inhaler, Inhale 2 puffs into the lungs every 4 (four) hours as needed. For wheeze or shortness of breath, Disp: , Rfl: ;  aspirin 81 MG tablet, Take 81 mg by mouth daily.  , Disp: , Rfl: ;  budesonide-formoterol (SYMBICORT) 160-4.5 MCG/ACT inhaler, Inhale 2 puffs into the lungs 2 (two) times daily., Disp: 1 Inhaler, Rfl:  5 Diphenhyd-Hydrocort-Nystatin (FIRST-DUKES MOUTHWASH) SUSP, 1 teaspoon swish and swallow 4 times a day, Disp: 120 mL, Rfl: 0;  furosemide (LASIX) 40 MG tablet, Take 1 tablet (40 mg total) by mouth daily., Disp: 30 tablet, Rfl: 5;  methylPREDNISolone (MEDROL) 4 MG tablet, 6 day pack take as directed, Disp: 21 tablet, Rfl: 0;  mometasone (NASONEX) 50 MCG/ACT nasal spray, Place 2 sprays into the nose daily., Disp: 17 g, Rfl: 5 Multiple Vitamin (MULTIVITAMIN) capsule, Take 1 capsule by mouth daily.  , Disp: , Rfl: ;  Phenyleph-Doxylamine-DM-APAP (ALKA-SELTZER PLS ALLERGY & CGH) 5-6.25-10-325 MG CAPS, Take 2 tablets by mouth 3 (three) times daily as needed. For cold symptoms, Disp: , Rfl: ;  potassium chloride SA (K-DUR,KLOR-CON) 20 MEQ tablet, Take 1 tablet (20 mEq total) by mouth daily., Disp: 30 tablet, Rfl: 5 pravastatin (PRAVACHOL) 40 MG tablet, Take 1 tablet (40 mg total) by mouth daily., Disp: 30 tablet, Rfl: 11;  zolpidem (AMBIEN) 10 MG tablet, Take 1 tablet (10 mg total) by mouth daily., Disp: 30 tablet, Rfl: 1;  zolpidem (AMBIEN) 10 MG tablet, Take 1 tablet (10 mg total) by mouth at bedtime as needed for sleep., Disp: 30 tablet, Rfl: 5  BP 119/77  Pulse 58  Resp 16  Ht 5\' 5"  (1.651 m)  Wt 232 lb (105.235 kg)  BMI 38.61 kg/m2  Body mass index is 38.61 kg/(m^2).         Review of Systems     Objective:  Physical Exam blood pressure 119/77 heart rate 58 respirations 16 General well-developed well-nourished female in no apparent stress alert and oriented x3 Left lower extremity with 3+ femoral and dorsalis pedis pulse palpable. There is mild-to-moderate tenderness along the course the left great saphenous vein from the distal thigh the saphenofemoral junction. There is no erythema or blistering of the skin. Bulging varicosities are decompressed. There is a prominent area in the lateral calf proximally were some reticular veins are located.  Today I ordered a venous duplex exam of the  left leg which are reviewed and interpreted. There is no DVT. There is total closure of the left great saphenous vein from the distal thigh to the saphenofemoral junction    Assessment:     Successful laser ablation left great saphenous vein performed for valvular incompetence with venous hypertension pain and swelling    Plan:     Return to see [place] on a when necessary basis

## 2012-03-23 NOTE — Procedures (Unsigned)
DUPLEX DEEP VENOUS EXAM - LOWER EXTREMITY  INDICATION:  Followup left great saphenous vein ablation  HISTORY:  Edema:  Yes Trauma/Surgery:  Left GSV ablation 03/01/2012 Pain:  No PE:  No Previous DVT:  No Anticoagulants:  No Other:  DUPLEX EXAM:               CFV   SFV     PopV  PTV   GSV               R  L  R  L    R  L  R  L  R  L Thrombosis       o     NV      o     o     + Spontaneous      +             +     +     o Phasic           +             +     +     o Augmentation     +             +     +     o Compressible     +             +     +     o Competent        o             +     +     +  Legend:  + - yes  o - no  p - partial  D - decreased  IMPRESSION: 1. No evidence of deep venous thrombosis in the left lower extremity     although visualization was limited due to edema and vessel depth. 2. The left great saphenous vein appears thrombosed from the     saphenofemoral junction to the distal insertion site.    _____________________________ Quita Skye. Hart Rochester, M.D.  EM/MEDQ  D:  03/09/2012  T:  03/09/2012  Job:  [id number]

## 2012-03-26 ENCOUNTER — Encounter: Payer: Self-pay | Admitting: Pulmonary Disease

## 2012-03-26 ENCOUNTER — Ambulatory Visit (INDEPENDENT_AMBULATORY_CARE_PROVIDER_SITE_OTHER): Payer: Medicare Other | Admitting: Pulmonary Disease

## 2012-03-26 VITALS — BP 126/84 | HR 56 | Temp 96.9°F | Ht 65.0 in | Wt 248.4 lb

## 2012-03-26 DIAGNOSIS — I2510 Atherosclerotic heart disease of native coronary artery without angina pectoris: Secondary | ICD-10-CM

## 2012-03-26 DIAGNOSIS — M545 Low back pain, unspecified: Secondary | ICD-10-CM

## 2012-03-26 DIAGNOSIS — J449 Chronic obstructive pulmonary disease, unspecified: Secondary | ICD-10-CM

## 2012-03-26 DIAGNOSIS — E669 Obesity, unspecified: Secondary | ICD-10-CM

## 2012-03-26 DIAGNOSIS — D126 Benign neoplasm of colon, unspecified: Secondary | ICD-10-CM

## 2012-03-26 DIAGNOSIS — K219 Gastro-esophageal reflux disease without esophagitis: Secondary | ICD-10-CM

## 2012-03-26 DIAGNOSIS — M1990 Unspecified osteoarthritis, unspecified site: Secondary | ICD-10-CM

## 2012-03-26 DIAGNOSIS — I872 Venous insufficiency (chronic) (peripheral): Secondary | ICD-10-CM

## 2012-03-26 DIAGNOSIS — G9050 Complex regional pain syndrome I, unspecified: Secondary | ICD-10-CM

## 2012-03-26 DIAGNOSIS — E7800 Pure hypercholesterolemia, unspecified: Secondary | ICD-10-CM

## 2012-03-26 DIAGNOSIS — I10 Essential (primary) hypertension: Secondary | ICD-10-CM

## 2012-03-26 MED ORDER — MELOXICAM 15 MG PO TABS: 15.0000 mg | ORAL_TABLET | Freq: Every day | ORAL | Status: DC

## 2012-03-26 MED ORDER — METHOCARBAMOL 500 MG PO TABS: 500.0000 mg | ORAL_TABLET | Freq: Three times a day (TID) | ORAL | Status: AC | PRN

## 2012-03-26 MED ORDER — ALBUTEROL SULFATE HFA 108 (90 BASE) MCG/ACT IN AERS: 2.0000 | INHALATION_SPRAY | RESPIRATORY_TRACT | Status: DC | PRN

## 2012-03-26 NOTE — Progress Notes (Signed)
Subjective:    Patient ID: [name], female    DOB: 1941/10/20, 71 y.o.   MRN: [id number]  HPI 71 y/o BF here for a follow up of her mult med problems including: Allergies, HBP, CAD, Chol, Obesity, etc... she is also followed by DrCrenshaw for Cards, & DrESL for allergies...  ~  July 15, 2011:  48mo ROV & she had her left TKR 7/12 by Tora Perches, then went to NH for rehab; she had to be readmit 1 week later due to leg swelling & bruising "due to the blood thinner" she says (bleed into the calf per Ortho) ==> changed to SQ heparin, the ASA 325mg /d;  Labs from [facility] showed Hg= 9.6 on 06/23/11... She has Methocarbamol for muscle spasms & Oxycodone for pain...    >Her breathing remains stable on the Symbicort & she denies much cough, sputum, wheezing, or SOB...    >BP remains controlled on her Lasix 20mg /d> BP= 124/82 & she denies CP, palpit, SOB; he LE edema is stable now post op...    >She is supposed to be on Crestor 5mg /d but it is unclear what she went to the NH on & what they disch her on (prev Prav40); she is reminded to bring all meds to the OVs; her weight remains elev at 234# today...    >Other medical problems as noted...  ~  August 07, 2011:  3wk ROV & she is stable> getting PT twice weekly at [facility] & doing it at home the other 5d;  Her wt is up 2# to 236# & BP= 150/94 on Lasix 20mg /d & K20 daily... She notes some insomnia related to leg throbbing & persist edema so we decided to incr the LASIX to 40mg d & wrote new prescription for [facility] to use prn...  ~  September 19, 2011:  6wk ROV & she is stable, no new complaints or concerns; she has a painful knot on left wrist that looks like a ganglion cyst but she was checked by DrGramig & says it's "tendonitis" & gave her a shot & splint; she has finished her PT for the knee etc...     >Chest is good & denies cough, sputum, dyspnea, or pain; BP controlled on the Lasix 40mg /d as monotherapy & swelling is better...    >Weight  is essentially the same & we discussed diet + exercise program; stable on Prav40...    >GI has been stable & she is currently not on any GI meds; asked to watch bowels carefully since she takes Percocet for pain & Zolpidem for sleep...  ~  January 23, 2012:  61mo ROV & add-on for refractory URI/AB> started weeks ago w/ nasal congestion, drainage, sore throat, hoarseness, cough w/ yellow sput, wheezing, SOB, "tight" etc;  Went to ER 01/12/12 denied f/c/s, no CP, noted +smoker w/ COPD/asthma; CXR showed mild cardiomeg & ectatic Ao, pulm vasc congestion, DJD sp, NAD;  CTA was neg for PE, some bronchial thickening, +atherosclerotic changes in Ao & coronaries;  Labs showed wnl x Hg=11.3 & WBC=5.3;  she was treated w/ Doxy Bid & Pred taper, asked to restart her Symbicort;  Had f/u w/ TP & asked to continue Rx, quit smoking, take the Symbicort 2sp Bid & restart Mucinex...  Now c/o persistent cough, beige sput, & hoarseness> we decided to Rx w/ Levaquin, Medrol Dosepak, MMW;  She wants refills Vicodin & Ambien...    >She saw DrCrenshaw 1/13 for f/u CAD> last Myoview & 2DEcho were inn 2010;  she's been stable on ASA81mg /d, Lasix, KCl, Prav40; she denies angina & DrCrenshaw rec quit smoking, same meds, lose weight...    >She saw [facility] 2/13 for f/u severe ven insuffic left leg w/ pain & chr swelling, bulging varicosities; using compression hose, no salt, elevation, & Ibuprofen w/o improvement;  He plans laser ablation of the GSV & then he'll consider stab phlebectomy of secondary varicosities later...  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    She reports f/u [facility] 4/13 for laser ablation of the left GSV==> f/u venous duplex confirmed total closure of the left GSV, no DVT, etc; she is feeling much better but she cannot wear the compression stocking she says...          Problem List:  ALLERGIC RHINITIS (ICD-477.9) > on NASONEX, ASTEPRO, ZYRTEK, & allergy shots... POSITIVE PPD (ICD-795.5) CIGARETTE SMOKER CHRONIC OBSTRUCTIVE ASTHMA UNSPECIFIED (ICD-493.20) - she had +allergy skin testing for dust mites and started on allergy shots per DrESL along w/ SYMBICORT 160- 2spBid, but she doesn't take anything regularly... +smoker but decreased from 1/2 ppd x 42yrs to 1-2 cig/d. ~  2/11: she reports +PPD on pre-employment testing> sent to health dept, CXR- neg, & started on INH 300mg /d x64mo (they never sent records for [place] to review). ~  CXR 2/11 showed borderline heart size, tort Ao, sl incr markings, NAD... ~  subseq CXRs done at health dept for her pos PPD but we don't have the films or reports... ~  2/13: Refractory AB treated via ER w/ Doxy, Pred, restart Symbicort, +Mucinex, etc; we decided upon Levaquin, Medrol, MMW... ~  CXR 2/13 showed mild cardiomeg, ectatic Ao & coronary calcif, DJD sp, NAD; CTAngio was neg for PE, +bronchial wall thickening, NAD...  Hx of HYPERTENSION (ICD-401.9) - off Lisinopril/Hct (Hx ACE reaction) & taking LASIX 40mg  as needed for swelling... BP=124/84 today and denies HA, fatigue, visual changes, CP, palipit, dizziness, syncope, dyspnea, edema, etc... ~  EKG 6/12 showed SBrady w/ rate= 55, otherw WNL, NAD...  CAD (ICD-414.00) - known non-obstructive disease & atypic CP... on ASA 325mg /d, STATIN (Prav40), & off ACE. ~  cath 2/05 showed 20-60% obstructions in all 3 vessels... good LVF... ~  NuclearStressTest 3/09 was negative- no ischemia or infarction, EF=68%... ~  Eval for recurrent CP- EKG showed NSR, NAD;  2DEcho showed  mild focal basal septal hypertrophy, norm wall motion w/ EF= 55-65%, mild DD & PAsys= 37;  Myoview NEG- no scar, no ischemia, EF=64%... ~  ROV w/ DrCrenshaw 7/11 reviewed- continue ASA/ Statin/ smoking cessation/ monitor BP & Chol. ~  She saw DrCrenshaw 1/13 for f/u CAD> last Myoview & 2DEcho were in 2010; she's been stable on ASA81mg /d, Lasix, KCl, Prav40; she denies angina & DrCrenshaw rec quit smoking, same meds, lose weight... Note> aterosclerotic changes seen in Ao & coronaries on CTA 2/13..  VENOUS INSUFFICIENCY (ICD-459.81) - she follows a low salt diet... +LASIX 40mg /d for swelling... ~  1/10: we discussed  no salt, Lasix20, elevation, support hose. ~  She was evaluated by [facility] w/ severe ven insuffic left leg w/ pain & chr swelling, bulging varicosities; using compression hose, no salt, elevation, & Ibuprofen w/o improvement;  He plans laser ablation of the GSV & then he'll consider stab phlebectomy of secondary varicosities later... ~  4/13: s/p left GSV laser ablation; she is feeling better w/ decr pain etc after the procedure...  HYPERCHOLESTEROLEMIA (ICD-272.0) - on PRAVASTATIN 40mg /d now + diet efforts. ~  FLP 5/08 on Cres5 showed TChol 115, TG 83, HDL 50, LDL 49... tolerating well and taking med regularly. ~  FLP 3/09 on Cres5 showed TChol 126, TG 104, HDL 56, LDL 49... rec- same. ~  FLP 3/10 on Cres5 showed TChol 152, TG 97, HDL 77, LDL 55 ~  FLP 12/10 on Cres5 showed TChol 127, TG 100, HDL 52, LDL 55 ~  7/11:  pt requests change to less expensive statin> try PRAVASTATIN40 ~  FLP 10/11 on Prav40 showed TChol 143, TG 125, HDL 51, LDL 67... Continue same. ~  FLP 6/12 on Prav40 showed TChol 143, TG 145, HDL 61, LDL 53  OBESITY (ICD-278.00) - unable to diet effectively and get weight down...  ~  weight 5/10 = 238#, 5\' 5"  tall,  BMI= 40... we discussed diet and exercise program... again! ~  weight 1/11 = 228# ~  weight 7/11 = 232# ~  weight 10/11 = 230# ~  Weight 6/12 =  232# ~  Weight 10/12 = 234# ~  Weight 2/13 = 241# ~  Weight 4/13 = 248#... She is asked to get wt down!!!  GERD (ICD-530.81) - EGD 7/05 by DrStark w/ HH- supposed to be on PPI (Omeprazole 20mg ) & H2 blocker (Pepcid 20mg ) at bedtime... ~  1/10: she stopped above meds & is recommended to restart Rx. ~  1/11:  still not taking acid suppression meds "I use them as needed"  COLONIC POLYPS (ICD-211.3) - last colonoscopy was 12/03 showing several 3-68mm polyps (hyperplastic)...  HX OF GALLSTONE (ICD-V12.79) - seen on CT 7/06 and referred to [facility]- eval by DrCornett and being followed...  DEGENERATIVE JOINT DISEASE (ICD-715.90) - followed by DrBeane for ortho... Right knee arthroscopy 1/09 without help... s/p right TKR 06/12/08 by Tora Perches... 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 she is off the Etodolac & takes PERCOCET Prn... ~  1/11: now complaining of left knee pain & will f/u w/ DrBeane... ~  6/12:  DrBeane plans left TKR soon... OK for surg... ~  7/12: s/p left TKR per DrBeane & went to [facility] for rehab...  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   Past Surgical History  Procedure Date  . Abdominal hysterectomy   . Cspine surgery 1993    for arnold-chiari malformation  . Right knee arthroscopy 12/2007    Dr. Shelle Iron  . 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 Shelle Iron  . Joint replacement 06/05/11    Left total knee replacement    Outpatient Encounter Prescriptions as of 03/26/2012  Medication Sig Dispense Refill  . albuterol (PROVENTIL HFA;VENTOLIN HFA) 108 (90 BASE) MCG/ACT inhaler Inhale 2 puffs into the lungs every 4 (four) hours as needed. For wheeze or shortness of breath  1  Inhaler  6  . aspirin 81 MG tablet Take 81 mg by mouth daily.        . budesonide-formoterol (SYMBICORT) 160-4.5 MCG/ACT inhaler Inhale 2 puffs into the lungs 2 (two) times daily.  1 Inhaler  5  . Diphenhyd-Hydrocort-Nystatin (FIRST-DUKES MOUTHWASH) SUSP 1 teaspoon swish and swallow 4 times a day  120 mL  0  . furosemide (LASIX) 40 MG tablet Take 1 tablet (40 mg total) by mouth daily.  30 tablet  5  . mometasone (NASONEX) 50 MCG/ACT nasal spray Place 2 sprays into the nose daily.  17 g  5  . Multiple Vitamin (MULTIVITAMIN) capsule Take 1 capsule by mouth daily.        . potassium chloride SA (K-DUR,KLOR-CON) 20 MEQ tablet Take 1 tablet (20 mEq total) by mouth daily.  30 tablet  5  . pravastatin (PRAVACHOL) 40 MG tablet Take 1 tablet (40 mg total) by mouth daily.  30 tablet  11  . zolpidem (AMBIEN) 10 MG tablet Take 1 tablet (10 mg total) by mouth daily.  30 tablet  1  . meloxicam (MOBIC) 15 MG tablet Take 1 tablet (15 mg total) by mouth daily.  30 tablet  2  . methocarbamol (ROBAXIN) 500 MG tablet Take 1 tablet (500 mg total) by mouth 3 (three) times daily as needed.  90 tablet  5  . zolpidem (AMBIEN) 10 MG tablet Take 1 tablet (10 mg total) by mouth at bedtime as needed for sleep.  30 tablet  5    No Known Allergies   Current Medications, Allergies, Past Medical History, Past Surgical History, Family History, and Social History were reviewed in [employer] record.    Review of Systems         See HPI - all other systems neg except as noted... The patient complains of dyspnea on exertion & gait abn.  The patient denies anorexia, fever, weight loss, weight gain, vision loss, decreased hearing, hoarseness, chest pain, syncope, worsening edema, prolonged cough, headaches, hemoptysis, abdominal pain, melena, hematochezia, severe indigestion/heartburn, hematuria, incontinence, muscle weakness, suspicious skin lesions, transient blindness, depression, unusual weight change,  abnormal bleeding, enlarged lymph nodes, and angioedema.     Objective:   Physical Exam      WD, Overweight, 71 y/o BF in NAD... GENERAL:  Alert & oriented; pleasant & cooperative... HEENT:  [place]/AT, EOM-wnl, PERRLA, EACs-clear, TMs-wnl, NOSE-clear, THROAT-clear & wnl, no lesions seen; VOICE- sl hoarse. NECK:  Supple w/ decrROM; no JVD; normal carotid impulses w/o bruits; no thyromegaly or nodules palpated; no lymphadenopathy. CHEST:  Few scat rhonchi, mild congestion & exp wheezing, no consolidation... HEART:  Regular Rhythm, gr 1/6 SEM without rubs or gallops... ABDOMEN:  Obese, soft & nontender; normal bowel sounds; no organomegaly or masses detected. EXT:  s/p right TKR, mod arthritic changes; +varicose veins (s/p laser ablation left GSV 4/13)/ +venous insuffic/ 1+ edema... NEURO:  CN's intact; no focal neuro deficits... DERM:  No lesions noted; no rash etc...  RADIOLOGY DATA:  Reviewed in the EPIC EMR & discussed w/ the patient...  LABORATORY DATA:  Reviewed in the EPIC EMR & discussed w/ the patient...   Assessment & Plan:   BACK PAIN>  She notes fall 1/13 on her school bus, they sent her to Ortho & treated conservatively; now w/ recurrent discomfort & rec> MOBIC15 & ROBAXIN500Tid... She will f/u w/ her Ortho specialist...   Hx Asthma/ COPD/ AB, smoker (?ex), & hx pos PPD- treated> She's had refractory "attack" &  treated w/ Levaquin 500mg /d, Medrol dosepak, Mucinex 2Bid, MMW, etc... Must quit all smoking & stay quit!  If hoarseness persists then refer to ENT...  HBP>  BP controlled on diet + Lasix Rx...  CAD>  Denies CP, angina, etc;  On ASA, statin, off cigs, etc...  Ven Insuffic>  On sodium restriction, elevation, support hose; also on LASIX to 40mg /d & followed by [facility] & s/p laser ablation of left GSV- improved.  CHOL>  Well controlled on her Statin med but what happened betw Ortho hosp & NH disch? We had her on Prav40 w/ good labs but not on hosp formulary & the  ortho PA didn't pay attn to med reconciliation & wrote for Crestor5; who knows what happened in the NH? Now she should restart our prev therapy ==> PRAVASTATIN 40mg /d...  OBESITY>  Needs better diet & hopefully incr exerc after the knee surg...  GI>  GERD, Polyps, Gallstone> follwed by DrStark & stable on Omep20 but uses prn only...  S/P left TKR 7/12 by DrBeane>  Course complic by bleed into left calf; now stable & improving w/ PT etc...  Other medical issues as noted...   Patient's Medications  New Prescriptions   MELOXICAM (MOBIC) 15 MG TABLET    Take 1 tablet (15 mg total) by mouth daily.   METHOCARBAMOL (ROBAXIN) 500 MG TABLET    Take 1 tablet (500 mg total) by mouth 3 (three) times daily as needed.  Previous Medications   ASPIRIN 81 MG TABLET    Take 81 mg by mouth daily.     BUDESONIDE-FORMOTEROL (SYMBICORT) 160-4.5 MCG/ACT INHALER    Inhale 2 puffs into the lungs 2 (two) times daily.   DIPHENHYD-HYDROCORT-NYSTATIN (FIRST-DUKES MOUTHWASH) SUSP    1 teaspoon swish and swallow 4 times a day   FUROSEMIDE (LASIX) 40 MG TABLET    Take 1 tablet (40 mg total) by mouth daily.   MOMETASONE (NASONEX) 50 MCG/ACT NASAL SPRAY    Place 2 sprays into the nose daily.   MULTIPLE VITAMIN (MULTIVITAMIN) CAPSULE    Take 1 capsule by mouth daily.     POTASSIUM CHLORIDE SA (K-DUR,KLOR-CON) 20 MEQ TABLET    Take 1 tablet (20 mEq total) by mouth daily.   PRAVASTATIN (PRAVACHOL) 40 MG TABLET    Take 1 tablet (40 mg total) by mouth daily.   ZOLPIDEM (AMBIEN) 10 MG TABLET    Take 1 tablet (10 mg total) by mouth at bedtime as needed for sleep.   ZOLPIDEM (AMBIEN) 10 MG TABLET    Take 1 tablet (10 mg total) by mouth daily.  Modified Medications   Modified Medication Previous Medication   ALBUTEROL (PROVENTIL HFA;VENTOLIN HFA) 108 (90 BASE) MCG/ACT INHALER albuterol (PROVENTIL HFA;VENTOLIN HFA) 108 (90 BASE) MCG/ACT inhaler      Inhale 2 puffs into the lungs every 4 (four) hours as needed. For wheeze or  shortness of breath    Inhale 2 puffs into the lungs every 4 (four) hours as needed. For wheeze or shortness of breath  Discontinued Medications   METHYLPREDNISOLONE (MEDROL) 4 MG TABLET    6 day pack take as directed   PHENYLEPH-DOXYLAMINE-DM-APAP (ALKA-SELTZER PLS ALLERGY & CGH) 5-6.25-10-325 MG CAPS    Take 2 tablets by mouth 3 (three) times daily as needed. For cold symptoms

## 2012-03-26 NOTE — Patient Instructions (Signed)
Today we updated your med list in our EPIC system...    Continue your current medications the same...  For your back discomfort:    Rest, and use HEAT to the area...    Take the MOBIC (Meloxicam) 15mg  daily w/ a meal...    Take the ROBAXIN muscle relaxer three times daily...    Follow up w/ your Ortho specialist who checked you previously...  Call for any questions...  Let's plan a follow up visit in 4-6 months w/ FASTING blood work at that time.Marland KitchenMarland Kitchen

## 2012-04-03 ENCOUNTER — Other Ambulatory Visit: Payer: Self-pay | Admitting: Pulmonary Disease

## 2012-04-05 ENCOUNTER — Other Ambulatory Visit: Payer: Self-pay | Admitting: Pulmonary Disease

## 2012-04-19 ENCOUNTER — Telehealth: Payer: Self-pay | Admitting: Pulmonary Disease

## 2012-04-19 NOTE — Telephone Encounter (Signed)
LMTCB x 1 

## 2012-04-20 MED ORDER — HYDROCODONE-ACETAMINOPHEN 5-500 MG PO TABS: 1.0000 | ORAL_TABLET | Freq: Three times a day (TID) | ORAL | Status: AC | PRN

## 2012-04-20 NOTE — Telephone Encounter (Signed)
LMOMTCB x1 for pt 

## 2012-04-20 NOTE — Telephone Encounter (Signed)
Pt returned call. [name]  

## 2012-04-20 NOTE — Telephone Encounter (Signed)
lmomtcb x1 for pt 

## 2012-04-20 NOTE — Telephone Encounter (Signed)
Pt returned call.  C/o left leg pain beginning in the back of her calf radiating up into the left buttock w/ walking x4days.  States it's " just a pain, not like a cramp."  Has been using ibuprofen and heat that does help until she applies pressure and the pain returns.  Mobic 15mg  (currently on med list) does not help.   [facility]. No Known Allergies - verified.  Dr Kriste Basque please advise, thanks.  Last ov 4.26.13, upcoming 8.29.13.

## 2012-04-20 NOTE — Telephone Encounter (Signed)
Called, spoke with pt.  I informed her of below per Dr. Kriste Basque.  She is aware vicodin rx has been called into [facility] and OV scheduled with Dr. Kriste Basque on Friday, May 24 at 9:30 am.  Advised if symptoms worsen prior to this, pls call back or seek emergency care if needed.  She verbalized understanding of this and voiced no further questions/concerns at this time.

## 2012-04-20 NOTE — Telephone Encounter (Signed)
Per SN--not sure what this is---needs ov for further eval----call in vicodin 5/500 #90  1 po tid prn pain with no refills and ov with SN on Friday at 9:30 am.  thanks

## 2012-04-20 NOTE — Telephone Encounter (Signed)
Patient returning call.  [phone]

## 2012-04-23 ENCOUNTER — Ambulatory Visit (INDEPENDENT_AMBULATORY_CARE_PROVIDER_SITE_OTHER): Payer: Medicare Other | Admitting: Pulmonary Disease

## 2012-04-23 ENCOUNTER — Encounter: Payer: Self-pay | Admitting: Pulmonary Disease

## 2012-04-23 VITALS — BP 120/78 | HR 74 | Temp 97.7°F | Ht 65.0 in | Wt 247.6 lb

## 2012-04-23 DIAGNOSIS — E7800 Pure hypercholesterolemia, unspecified: Secondary | ICD-10-CM

## 2012-04-23 DIAGNOSIS — M1990 Unspecified osteoarthritis, unspecified site: Secondary | ICD-10-CM

## 2012-04-23 DIAGNOSIS — E669 Obesity, unspecified: Secondary | ICD-10-CM

## 2012-04-23 DIAGNOSIS — M545 Low back pain, unspecified: Secondary | ICD-10-CM

## 2012-04-23 DIAGNOSIS — M79609 Pain in unspecified limb: Secondary | ICD-10-CM

## 2012-04-23 DIAGNOSIS — M79605 Pain in left leg: Secondary | ICD-10-CM

## 2012-04-23 DIAGNOSIS — F17200 Nicotine dependence, unspecified, uncomplicated: Secondary | ICD-10-CM

## 2012-04-23 DIAGNOSIS — I872 Venous insufficiency (chronic) (peripheral): Secondary | ICD-10-CM

## 2012-04-23 DIAGNOSIS — I10 Essential (primary) hypertension: Secondary | ICD-10-CM

## 2012-04-23 DIAGNOSIS — I2510 Atherosclerotic heart disease of native coronary artery without angina pectoris: Secondary | ICD-10-CM

## 2012-04-23 DIAGNOSIS — J449 Chronic obstructive pulmonary disease, unspecified: Secondary | ICD-10-CM

## 2012-04-23 NOTE — Patient Instructions (Signed)
Today we updated your med list in our EPIC system...    Continue your current medications the same...  We discussed using your Vicodin, Mobic or Advil, and heat to the area for the pain...  We will arrange for an Orthopedic consultation for further evaluation of your problem...  Call for any questions.Marland KitchenMarland Kitchen

## 2012-04-23 NOTE — Progress Notes (Signed)
Subjective:    Patient ID: [name], female    DOB: 1941/10/20, 71 y.o.   MRN: [id number]  HPI 71 y/o BF here for a follow up of her mult med problems including: Allergies, HBP, CAD, Chol, Obesity, etc... she is also followed by [name] for Cards, & [name] for allergies...  ~  July 15, 2011:  48mo ROV & she had her left TKR 7/12 by [name], then went to NH for rehab; she had to be readmit 1 week later due to leg swelling & bruising "due to the blood thinner" she says (bleed into the calf per [name]) ==> changed to SQ heparin, the ASA 325mg /d;  Labs from [facility] showed Hg= 9.6 on 06/23/11... She has Methocarbamol for muscle spasms & Oxycodone for pain...    >Her breathing remains stable on the Symbicort & she denies much cough, sputum, wheezing, or SOB...    >BP remains controlled on her Lasix 20mg /d> BP= 124/82 & she denies CP, palpit, SOB; he LE edema is stable now post op...    >She is supposed to be on Crestor 5mg /d but it is unclear what she went to the NH on & what they disch her on (prev Prav40); she is reminded to bring all meds to the OVs; her weight remains elev at 234# today...    >Other medical problems as noted...  ~  August 07, 2011:  3wk ROV & she is stable> getting PT twice weekly at [name] & doing it at home the other 5d;  Her wt is up 2# to 236# & BP= 150/94 on Lasix 20mg /d & K20 daily... She notes some insomnia related to leg throbbing & persist edema so we decided to incr the LASIX to 40mg d & wrote new prescription for [name] to use prn...  ~  September 19, 2011:  6wk ROV & she is stable, no new complaints or concerns; she has a painful knot on left wrist that looks like a ganglion cyst but she was checked by [name] & says it's "tendonitis" & gave her a shot & splint; she has finished her PT for the knee etc...     >Chest is good & denies cough, sputum, dyspnea, or pain; BP controlled on the Lasix 40mg /d as monotherapy & swelling is better...    >Weight  is essentially the same & we discussed diet + exercise program; stable on Prav40...    >GI has been stable & she is currently not on any GI meds; asked to watch bowels carefully since she takes Percocet for pain & Zolpidem for sleep...  ~  January 23, 2012:  61mo ROV & add-on for refractory URI/AB> started weeks ago w/ nasal congestion, drainage, sore throat, hoarseness, cough w/ yellow sput, wheezing, SOB, "tight" etc;  Went to ER 01/12/12 denied f/c/s, no CP, noted +smoker w/ COPD/asthma; CXR showed mild cardiomeg & ectatic Ao, pulm vasc congestion, DJD sp, NAD;  CTA was neg for PE, some bronchial thickening, +atherosclerotic changes in Ao & coronaries;  Labs showed wnl x Hg=11.3 & WBC=5.3;  she was treated w/ Doxy Bid & Pred taper, asked to restart her Symbicort;  Had f/u w/ TP & asked to continue Rx, quit smoking, take the Symbicort 2sp Bid & restart Mucinex...  Now c/o persistent cough, beige sput, & hoarseness> we decided to Rx w/ Levaquin, Medrol Dosepak, MMW;  She wants refills Vicodin & Ambien...    >She saw [name] 1/13 for f/u CAD> last Myoview & 2DEcho were inn 2010;  she's been stable on ASA81mg /d, Lasix, KCl, Prav40; she denies angina & [name] rec quit smoking, same meds, lose weight...    >She saw [name] 2/13 for f/u severe ven insuffic left leg w/ pain & chr swelling, bulging varicosities; using compression hose, no salt, elevation, & Ibuprofen w/o improvement;  He plans laser ablation of the GSV & then he'll consider stab phlebectomy of secondary varicosities later...  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    She reports f/u [name] 4/13 for laser ablation of the left GSV==> f/u venous duplex confirmed total closure of the left GSV, no DVT, etc; she is feeling much better but she cannot wear the compression stocking she says...  ~  Apr 23, 2012:  32mo ROV & add-on for persistent back & left leg pain> see note from 03/26/12 visit- she was c/o recurrent pain in the right side of her back x1wk & we suggested Mobic/ Robaxin/ Heat & f/u w/ [name];  She states min improvement w/ meds & ?she saw [name] ?who and no change in rec for rest heat & anti-inflamm rx;  No better so she ret to see us> known DJD, [name] has done bilat TKRs, we will refer to [name] for further eval of her back & left leg pain... For now she has Vicodin to take up to Tid as needed + Mobic & left over muscle relaxer... Review of EPIC Imaging section reveals XRays of knees w/ bilat TKRs by [name] but no prev imaging of back etc...          Problem List:  ALLERGIC RHINITIS (ICD-477.9) > on NASONEX, ASTEPRO, ZYRTEK, & allergy shots... POSITIVE PPD (ICD-795.5) CIGARETTE SMOKER CHRONIC OBSTRUCTIVE ASTHMA UNSPECIFIED (ICD-493.20) - she had +allergy skin testing for dust mites and started on allergy shots per [name] along w/ SYMBICORT 160- 2spBid, but she doesn't take anything regularly... +smoker but decreased from 1/2 ppd x 68yrs to 1-2 cig/d. ~  2/11: she reports +PPD on pre-employment testing> sent to health dept, CXR- neg, & started on INH 300mg /d x59mo (they never sent records for [place] to review). ~  CXR 2/11 showed borderline heart size, tort Ao, sl incr markings, NAD... ~  subseq CXRs done at health dept for her pos PPD but we don't have the films or reports... ~  2/13: Refractory AB treated via ER w/ Doxy, Pred, restart Symbicort, +Mucinex, etc; we decided upon Levaquin, Medrol, MMW... ~  CXR 2/13 showed mild cardiomeg, ectatic Ao & coronary calcif, DJD  sp, NAD; CTAngio was neg for PE, +bronchial wall thickening, NAD...  Hx of HYPERTENSION (ICD-401.9) - off Lisinopril/Hct (Hx ACE reaction) & taking LASIX 40mg  as needed for swelling... BP=120/78 today and denies HA, fatigue, visual changes, CP, palipit, dizziness, syncope, dyspnea, edema, etc... ~  EKG 6/12 showed SBrady w/ rate= 55, otherw WNL, NAD...  CAD (ICD-414.00) - known non-obstructive disease & atypic CP... on ASA 325mg /d, STATIN (Prav40), & off ACE. ~  cath  2/05 showed 20-60% obstructions in all 3 vessels... good LVF... ~  NuclearStressTest 3/09 was negative- no ischemia or infarction, EF=68%... ~  Eval for recurrent CP- EKG showed NSR, NAD;  2DEcho showed mild focal basal septal hypertrophy, norm wall motion w/ EF= 55-65%, mild DD & PAsys= 37;  Myoview NEG- no scar, no ischemia, EF=64%... ~  ROV w/ [name] 7/11 reviewed- continue ASA/ Statin/ smoking cessation/ monitor BP & Chol. ~  She saw [name] 1/13 for f/u CAD> last Myoview & 2DEcho were in 2010; she's been stable on ASA81mg /d, Lasix, KCl, Prav40; she denies angina & [name] rec quit smoking, same meds, lose weight... Note> aterosclerotic changes seen in Ao & coronaries on CTA 2/13..  VENOUS INSUFFICIENCY (ICD-459.81) - she follows a low salt diet... +LASIX 40mg /d for swelling... ~  1/10: we discussed no salt, Lasix20, elevation, support hose. ~  [facility] was [facility] w/ severe ven insuffic left leg w/ pain & chr swelling, bulging varicosities; using compression hose, no salt, elevation, & Ibuprofen w/o improvement;  He plans laser ablation of the GSV & then he'll consider stab phlebectomy of secondary varicosities later... ~  4/13: s/p left GSV laser ablation; she is feeling better w/ decr pain etc after the procedure...  HYPERCHOLESTEROLEMIA (ICD-272.0) - on PRAVASTATIN 40mg /d now + diet efforts. ~  FLP 5/08 on Cres5 showed TChol 115, TG 83, HDL 50, LDL 49... tolerating well and taking med regularly. ~  FLP  3/09 on Cres5 showed TChol 126, TG 104, HDL 56, LDL 49... rec- same. ~  FLP 3/10 on Cres5 showed TChol 152, TG 97, HDL 77, LDL 55 ~  FLP 12/10 on Cres5 showed TChol 127, TG 100, HDL 52, LDL 55 ~  7/11:  pt requests change to less expensive statin> try PRAVASTATIN40 ~  FLP 10/11 on Prav40 showed TChol 143, TG 125, HDL 51, LDL 67... Continue same. ~  FLP 6/12 on Prav40 showed TChol 143, TG 145, HDL 61, LDL 53  OBESITY (ICD-278.00) - unable to diet effectively and get weight down...  ~  weight 5/10 = 238#, 5\' 5"  tall,  BMI= 40... we discussed diet and exercise program... again! ~  weight 1/11 = 228# ~  weight 7/11 = 232# ~  weight 10/11 = 230# ~  Weight 6/12 = 232# ~  Weight 10/12 = 234# ~  Weight 2/13 = 241# ~  Weight 4/13 = 248#... She is asked to get wt down!!!  GERD (ICD-530.81) - EGD 7/05 by DrStark w/ HH- supposed to be on PPI (Omeprazole 20mg ) & H2 blocker (Pepcid 20mg ) at bedtime... ~  1/10: she stopped above meds & is recommended to restart Rx. ~  1/11:  still not taking acid suppression meds "I use them as needed"  COLONIC POLYPS (ICD-211.3) - last colonoscopy was 12/03 showing several 3-72mm polyps (hyperplastic)...  HX OF GALLSTONE (ICD-V12.79) - seen on CT 7/06 and referred to [facility]- eval by DrCornett and being followed...  DEGENERATIVE JOINT DISEASE (ICD-715.90) - followed by [name] for [name]... Right knee arthroscopy 1/09 without help... s/p right TKR 06/12/08 by [name]... 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 she is off the Etodolac & takes PERCOCET Prn... ~  1/11: now complaining of left knee pain & will f/u w/ [name]... ~  6/12:  [name] plans left TKR soon... OK for surg... ~  7/12: s/p left TKR per [name] & went to [facility] for rehab...  BACK PAIN, LUMBAR (ICD-724.2) >> presented 4/13 w/ pain in  left side of back & radiating down leg leg... Given Vicodin, anti-inflamm med & muscle relaxer;  Referred to [name] for further eval...  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   Past Surgical History  Procedure Date  . Abdominal hysterectomy   . Cspine surgery 1993    for arnold-chiari malformation  . Right knee arthroscopy 12/2007    Dr. Shelle Iron  . 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 Shelle Iron  . Joint replacement 06/05/11    Left total knee replacement    Outpatient Encounter Prescriptions as of 04/23/2012  Medication Sig Dispense Refill  . albuterol (PROVENTIL HFA;VENTOLIN HFA) 108 (90 BASE) MCG/ACT inhaler Inhale 2 puffs into the lungs every 4 (four) hours as needed. For wheeze or shortness of breath  1 Inhaler  6  . aspirin 81 MG tablet Take 81 mg by mouth daily.        . budesonide-formoterol (SYMBICORT) 160-4.5 MCG/ACT inhaler Inhale 2 puffs into the lungs 2 (two) times daily.  1 Inhaler  5  . furosemide (LASIX) 40 MG tablet TAKE 1 TABLET (40 MG TOTAL) BY MOUTH DAILY.  30 tablet  5  . HYDROcodone-acetaminophen (VICODIN) 5-500 MG per tablet Take 1 tablet by mouth 3 (three) times daily as needed for pain.  90 tablet  0  . KLOR-CON M20 20 MEQ tablet TAKE 1 TABLET (20 MEQ TOTAL) BY MOUTH DAILY.  30 tablet  5  . meloxicam (MOBIC) 15 MG tablet Take 1 tablet (15 mg total) by mouth daily.  30 tablet  2  . mometasone (NASONEX) 50 MCG/ACT nasal spray Place 2 sprays into the nose daily.  17 g  5  . Multiple Vitamin (MULTIVITAMIN) capsule Take 1 capsule by mouth daily.        . pravastatin (PRAVACHOL) 40 MG tablet Take 1 tablet (40 mg total) by mouth daily.  30 tablet  11  . zolpidem (AMBIEN) 10 MG tablet Take 1 tablet (10 mg total) by mouth daily.  30 tablet  1  . DISCONTD: Diphenhyd-Hydrocort-Nystatin (FIRST-DUKES MOUTHWASH) SUSP 1 teaspoon swish and swallow 4 times a day  120 mL  0  .  DISCONTD: zolpidem (AMBIEN) 10 MG tablet Take 1 tablet (10 mg total) by mouth at bedtime as needed for sleep.  30 tablet  5    No Known Allergies   Current Medications, Allergies, Past Medical History, Past Surgical History, Family History, and Social History were reviewed in [employer] record.    Review of Systems         See HPI - all other systems neg except as noted... The patient complains of dyspnea on exertion & gait abn.  The patient denies anorexia, fever, weight loss, weight gain, vision loss, decreased hearing, hoarseness, chest pain, syncope, worsening edema, prolonged cough, headaches, hemoptysis, abdominal pain, melena, hematochezia, severe indigestion/heartburn, hematuria, incontinence, muscle weakness, suspicious skin lesions, transient blindness, depression, unusual weight change, abnormal bleeding, enlarged lymph nodes, and angioedema.     Objective:   Physical Exam      WD, Overweight, 71 y/o BF in NAD... GENERAL:  Alert & oriented; pleasant & cooperative... HEENT:  [place]/AT, EOM-wnl, PERRLA, EACs-clear, TMs-wnl, NOSE-clear, THROAT-clear & wnl, no lesions seen; VOICE- sl hoarse. NECK:  Supple w/ decrROM; no JVD; normal  carotid impulses w/o bruits; no thyromegaly or nodules palpated; no lymphadenopathy. CHEST:  Few scat rhonchi, mild congestion & exp wheezing, no consolidation... HEART:  Regular Rhythm, gr 1/6 SEM without rubs or gallops... ABDOMEN:  Obese, soft & nontender; normal bowel sounds; no organomegaly or masses detected. EXT:  s/p right TKR, mod arthritic changes; +varicose veins (s/p laser ablation left GSV 4/13)/ +venous insuffic/ 1+ edema... NEURO:  CN's intact; no focal neuro deficits... DERM:  No lesions noted; no rash etc...  RADIOLOGY DATA:  Reviewed in the EPIC EMR & discussed w/ the patient...  LABORATORY DATA:  Reviewed in the EPIC EMR & discussed w/ the patient...   Assessment & Plan:   BACK PAIN>  She notes fall 1/13 on  her school bus, they sent her to [name] & treated conservatively; now w/ recurrent discomfort & rec> MOBIC15 & ROBAXIN500Tid... She will need [name] referral for further eval...   Hx Asthma/ COPD/ AB, smoker (?ex), & hx pos PPD- treated> She's had refractory "attack" & treated w/ Levaquin 500mg /d, Medrol dosepak, Mucinex 2Bid, MMW, etc... Must quit all smoking & stay quit!  If hoarseness persists then refer to ENT...  HBP>  BP controlled on diet + Lasix Rx...  CAD>  Denies CP, angina, etc;  On ASA, statin, off cigs, etc...  Ven Insuffic>  On sodium restriction, elevation, support hose; also on LASIX to 40mg /d & followed by [name] & s/p laser ablation of left GSV- improved.  CHOL>  Well controlled on her Statin med but what happened betw [name] & [facility] disch? We had her on Prav40 w/ good labs but not on hosp formulary & the ortho PA didn't pay attn to med reconciliation & wrote for Crestor5; who knows what happened in the NH? Now she should restart our prev therapy ==> PRAVASTATIN 40mg /d...  OBESITY>  Needs better diet & hopefully incr exerc after the knee surg...  GI>  GERD, Polyps, Gallstone> follwed by DrStark & stable on Omep20 but uses prn only...  S/P left TKR 7/12 by [name]>  Course complic by bleed into left calf; now stable & improving w/ PT etc...  Other medical issues as noted...   Patient's Medications  New Prescriptions   No medications on file  Previous Medications   ALBUTEROL (PROVENTIL HFA;VENTOLIN HFA) 108 (90 BASE) MCG/ACT INHALER    Inhale 2 puffs into the lungs every 4 (four) hours as needed. For wheeze or shortness of breath   ASPIRIN 81 MG TABLET    Take 81 mg by mouth daily.     BUDESONIDE-FORMOTEROL (SYMBICORT) 160-4.5 MCG/ACT INHALER    Inhale 2 puffs into the lungs 2 (two) times daily.   FUROSEMIDE (LASIX) 40 MG TABLET    TAKE 1 TABLET (40 MG TOTAL) BY MOUTH DAILY.   HYDROCODONE-ACETAMINOPHEN (VICODIN) 5-500 MG PER TABLET    Take 1 tablet by mouth 3  (three) times daily as needed for pain.   KLOR-CON M20 20 MEQ TABLET    TAKE 1 TABLET (20 MEQ TOTAL) BY MOUTH DAILY.   MELOXICAM (MOBIC) 15 MG TABLET    Take 1 tablet (15 mg total) by mouth daily.   MOMETASONE (NASONEX) 50 MCG/ACT NASAL SPRAY    Place 2 sprays into the nose daily.   MULTIPLE VITAMIN (MULTIVITAMIN) CAPSULE    Take 1 capsule by mouth daily.     PRAVASTATIN (PRAVACHOL) 40 MG TABLET    Take 1 tablet (40 mg total) by mouth daily.   ZOLPIDEM (AMBIEN) 10 MG TABLET  Take 1 tablet (10 mg total) by mouth daily.  Modified Medications   No medications on file  Discontinued Medications   DIPHENHYD-HYDROCORT-NYSTATIN (FIRST-DUKES MOUTHWASH) SUSP    1 teaspoon swish and swallow 4 times a day   ZOLPIDEM (AMBIEN) 10 MG TABLET    Take 1 tablet (10 mg total) by mouth at bedtime as needed for sleep.

## 2012-04-29 ENCOUNTER — Other Ambulatory Visit: Payer: Self-pay | Admitting: *Deleted

## 2012-04-29 ENCOUNTER — Telehealth: Payer: Self-pay | Admitting: *Deleted

## 2012-04-29 DIAGNOSIS — I83893 Varicose veins of bilateral lower extremities with other complications: Secondary | ICD-10-CM

## 2012-04-29 NOTE — Telephone Encounter (Signed)
Returned [name] telephone message from this morning regarding left leg pain.  [name] is s/p endovenous laser ablation left greater saphenous vein on 03-01-2012 by J.D. Hart Rochester MD.  [name] states for the past week she has experienced swelling in left foot/ankle and pain in left leg.  She states she has been wearing compression hose, using heat to the painful area, and taking Vicodin for pain.  I encouraged [name] to take Ibuprofen 600 mg po with food every 6-8 hours prn instead of Vicodin.   Appointment made for venous duplex of left leg 04-30-2012.  [name] verbalized understanding of all instructions.  Erez Mccallum, Neena Rhymes

## 2012-04-30 ENCOUNTER — Ambulatory Visit (INDEPENDENT_AMBULATORY_CARE_PROVIDER_SITE_OTHER): Payer: Medicare Other | Admitting: Vascular Surgery

## 2012-04-30 ENCOUNTER — Other Ambulatory Visit: Payer: Self-pay | Admitting: *Deleted

## 2012-04-30 DIAGNOSIS — M79609 Pain in unspecified limb: Secondary | ICD-10-CM

## 2012-04-30 DIAGNOSIS — I83893 Varicose veins of bilateral lower extremities with other complications: Secondary | ICD-10-CM

## 2012-04-30 DIAGNOSIS — M7989 Other specified soft tissue disorders: Secondary | ICD-10-CM

## 2012-04-30 NOTE — Progress Notes (Signed)
LLE venous duplex performed @ [facility] 04/30/2012

## 2012-04-30 NOTE — Progress Notes (Signed)
Saw patient after her reflux study which was negative. Patient relieved. Fitted her with knee highs to help compress her vv's left outer calf. Patient stated her leg felt better after I put them on her. Encouraged her to use heat and Ibuprofen prn. Patient seemed to feel better.

## 2012-05-04 ENCOUNTER — Ambulatory Visit: Payer: Medicare Other | Admitting: Vascular Surgery

## 2012-05-06 NOTE — Procedures (Unsigned)
DUPLEX DEEP VENOUS EXAM - LOWER EXTREMITY  INDICATION:  Left lower extremity pain and swelling  HISTORY:  Edema:  Yes Trauma/Surgery:  Left GSV endovenous laser ablation 03/01/2012 Pain:  Yes PE:  No Previous DVT:  No Anticoagulants:  No Other:  DUPLEX EXAM:               CFV   SFV   PopV  PTV    GSV               R  L  R  L  R  L  R   L  R  L Thrombosis    o  o     o     o      o     + Spontaneous   +  +     +     +      +     o Phasic        +  +     +     +      +     o Augmentation  +  +     +     +      +     o Compressible  +  +     +     +      +     o Competent     +  +     +     +      o  Legend:  + - yes  o - no  p - partial  D - decreased  IMPRESSION: 1. No evidence of left lower extremity deep venous thrombosis     identified. 2. Good post ablation result the length of the left great saphenous     vein ablated. 3. Note is made of a bundle of varicose veins involving the anterior     lateral mid left calf segment, patent and compressible. 4. Contralateral common femoral vein is patent and compressible.        _____________________________ Quita Skye. Hart Rochester, M.D.  SH/MEDQ  D:  04/30/2012  T:  04/30/2012  Job:  [id number]

## 2012-05-18 ENCOUNTER — Other Ambulatory Visit: Payer: Self-pay | Admitting: *Deleted

## 2012-05-18 MED ORDER — ZOLPIDEM TARTRATE 10 MG PO TABS: 10.0000 mg | ORAL_TABLET | Freq: Every day | ORAL | Status: DC

## 2012-05-27 ENCOUNTER — Telehealth: Payer: Self-pay | Admitting: Pulmonary Disease

## 2012-05-27 MED ORDER — AZITHROMYCIN 250 MG PO TABS: ORAL_TABLET | ORAL | Status: AC

## 2012-05-27 NOTE — Telephone Encounter (Signed)
Per SN---ok to call in zpak #1  Take as directed with no refills and the pt will need to take mucinex 1-2 tabs po bid with plenty of fluids.  Called and lmom for the pt to make her aware of these recs from SN and pt to call back for any questions or concerns.

## 2012-05-27 NOTE — Telephone Encounter (Signed)
Spoke with pt. She states that for the past 5 days has had cough and chest congestion, coughing up minimal clear sputum. Denies any SOB, CP or chest tightness, wheeze, f/c/s. Would like recs from SN. Please advise thanks! No Known Allergies

## 2012-07-29 ENCOUNTER — Encounter: Payer: Self-pay | Admitting: Pulmonary Disease

## 2012-07-29 ENCOUNTER — Other Ambulatory Visit (INDEPENDENT_AMBULATORY_CARE_PROVIDER_SITE_OTHER): Payer: Medicare Other

## 2012-07-29 ENCOUNTER — Ambulatory Visit (INDEPENDENT_AMBULATORY_CARE_PROVIDER_SITE_OTHER): Payer: Medicare Other | Admitting: Pulmonary Disease

## 2012-07-29 ENCOUNTER — Other Ambulatory Visit: Payer: Self-pay | Admitting: Pulmonary Disease

## 2012-07-29 VITALS — BP 124/82 | HR 60 | Temp 97.5°F | Ht 65.0 in | Wt 234.2 lb

## 2012-07-29 DIAGNOSIS — K219 Gastro-esophageal reflux disease without esophagitis: Secondary | ICD-10-CM

## 2012-07-29 DIAGNOSIS — D126 Benign neoplasm of colon, unspecified: Secondary | ICD-10-CM

## 2012-07-29 DIAGNOSIS — M545 Low back pain, unspecified: Secondary | ICD-10-CM

## 2012-07-29 DIAGNOSIS — E669 Obesity, unspecified: Secondary | ICD-10-CM

## 2012-07-29 DIAGNOSIS — I83893 Varicose veins of bilateral lower extremities with other complications: Secondary | ICD-10-CM

## 2012-07-29 DIAGNOSIS — I872 Venous insufficiency (chronic) (peripheral): Secondary | ICD-10-CM

## 2012-07-29 DIAGNOSIS — E7800 Pure hypercholesterolemia, unspecified: Secondary | ICD-10-CM

## 2012-07-29 DIAGNOSIS — M1990 Unspecified osteoarthritis, unspecified site: Secondary | ICD-10-CM

## 2012-07-29 DIAGNOSIS — I10 Essential (primary) hypertension: Secondary | ICD-10-CM

## 2012-07-29 DIAGNOSIS — G9050 Complex regional pain syndrome I, unspecified: Secondary | ICD-10-CM

## 2012-07-29 DIAGNOSIS — I2510 Atherosclerotic heart disease of native coronary artery without angina pectoris: Secondary | ICD-10-CM

## 2012-07-29 DIAGNOSIS — J449 Chronic obstructive pulmonary disease, unspecified: Secondary | ICD-10-CM

## 2012-07-29 LAB — CBC WITH DIFFERENTIAL/PLATELET
Basophils Absolute: 0 10*3/uL (ref 0.0–0.1)
Basophils Relative: 0.4 % (ref 0.0–3.0)
Eosinophils Absolute: 0.1 10*3/uL (ref 0.0–0.7)
Eosinophils Relative: 1.5 % (ref 0.0–5.0)
HCT: 36.9 % (ref 36.0–46.0)
Hemoglobin: 11.8 g/dL — ABNORMAL LOW (ref 12.0–15.0)
Lymphocytes Relative: 46.9 % — ABNORMAL HIGH (ref 12.0–46.0)
Lymphs Abs: 1.8 10*3/uL (ref 0.7–4.0)
MCHC: 32 g/dL (ref 30.0–36.0)
MCV: 80.9 fl (ref 78.0–100.0)
Monocytes Absolute: 0.3 10*3/uL (ref 0.1–1.0)
Monocytes Relative: 8.6 % (ref 3.0–12.0)
Neutro Abs: 1.7 10*3/uL (ref 1.4–7.7)
Neutrophils Relative %: 42.6 % — ABNORMAL LOW (ref 43.0–77.0)
Platelets: 202 10*3/uL (ref 150.0–400.0)
RBC: 4.56 Mil/uL (ref 3.87–5.11)
RDW: 13.9 % (ref 11.5–14.6)
WBC: 3.9 10*3/uL — ABNORMAL LOW (ref 4.5–10.5)

## 2012-07-29 LAB — BASIC METABOLIC PANEL
BUN: 16 mg/dL (ref 6–23)
CO2: 28 mEq/L (ref 19–32)
Calcium: 9.3 mg/dL (ref 8.4–10.5)
Chloride: 101 mEq/L (ref 96–112)
Creatinine, Ser: 0.7 mg/dL (ref 0.4–1.2)
GFR: 110.16 mL/min (ref >60.00)
Glucose, Bld: 95 mg/dL (ref 70–99)
Potassium: 3.8 mEq/L (ref 3.5–5.1)
Sodium: 138 mEq/L (ref 135–145)

## 2012-07-29 LAB — IBC PANEL
Iron: 49 ug/dL (ref 42–145)
Saturation Ratios: 12.4 % — ABNORMAL LOW (ref 20.0–50.0)
Transferrin: 283 mg/dL (ref 212.0–360.0)

## 2012-07-29 LAB — LIPID PANEL
Cholesterol: 141 mg/dL (ref 0–200)
HDL: 63 mg/dL (ref >39.00)
LDL Cholesterol: 60 mg/dL (ref 0–99)
Total CHOL/HDL Ratio: 2
Triglycerides: 92 mg/dL (ref 0.0–149.0)
VLDL: 18.4 mg/dL (ref 0.0–40.0)

## 2012-07-29 LAB — HEPATIC FUNCTION PANEL
ALT: 19 U/L (ref 0–35)
AST: 23 U/L (ref 0–37)
Albumin: 3.7 g/dL (ref 3.5–5.2)
Alkaline Phosphatase: 86 U/L (ref 39–117)
Bilirubin, Direct: 0.1 mg/dL (ref 0.0–0.3)
Total Bilirubin: 0.4 mg/dL (ref 0.3–1.2)
Total Protein: 7.3 g/dL (ref 6.0–8.3)

## 2012-07-29 LAB — TSH: TSH: 1.38 u[IU]/mL (ref 0.35–5.50)

## 2012-07-29 MED ORDER — MELOXICAM 15 MG PO TABS: 15.0000 mg | ORAL_TABLET | Freq: Every day | ORAL | Status: DC

## 2012-07-29 MED ORDER — HYDROCODONE-ACETAMINOPHEN 5-325 MG PO TABS: 1.0000 | ORAL_TABLET | Freq: Three times a day (TID) | ORAL | Status: AC | PRN

## 2012-07-29 NOTE — Progress Notes (Signed)
Subjective:    Patient ID: [name], female    DOB: 1941/10/20, 71 y.o.   MRN: [id number]  HPI 71 y/o BF here for a follow up of her mult med problems including: Allergies, HBP, CAD, Chol, Obesity, etc... she is also followed by DrCrenshaw for Cards, & DrESL for allergies...  ~  July 15, 2011:  48mo ROV & she had her left TKR 7/12 by Tora Perches, then went to NH for rehab; she had to be readmit 1 week later due to leg swelling & bruising "due to the blood thinner" she says (bleed into the calf per Ortho) ==> changed to SQ heparin, the ASA 325mg /d;  Labs from [facility] showed Hg= 9.6 on 06/23/11... She has Methocarbamol for muscle spasms & Oxycodone for pain...    >Her breathing remains stable on the Symbicort & she denies much cough, sputum, wheezing, or SOB...    >BP remains controlled on her Lasix 20mg /d> BP= 124/82 & she denies CP, palpit, SOB; he LE edema is stable now post op...    >She is supposed to be on Crestor 5mg /d but it is unclear what she went to the NH on & what they disch her on (prev Prav40); she is reminded to bring all meds to the OVs; her weight remains elev at 234# today...    >Other medical problems as noted...  ~  August 07, 2011:  3wk ROV & she is stable> getting PT twice weekly at [facility] & doing it at home the other 5d;  Her wt is up 2# to 236# & BP= 150/94 on Lasix 20mg /d & K20 daily... She notes some insomnia related to leg throbbing & persist edema so we decided to incr the LASIX to 40mg d & wrote new prescription for [facility] to use prn...  ~  September 19, 2011:  6wk ROV & she is stable, no new complaints or concerns; she has a painful knot on left wrist that looks like a ganglion cyst but she was checked by DrGramig & says it's "tendonitis" & gave her a shot & splint; she has finished her PT for the knee etc...     >Chest is good & denies cough, sputum, dyspnea, or pain; BP controlled on the Lasix 40mg /d as monotherapy & swelling is better...    >Weight  is essentially the same & we discussed diet + exercise program; stable on Prav40...    >GI has been stable & she is currently not on any GI meds; asked to watch bowels carefully since she takes Percocet for pain & Zolpidem for sleep...  ~  January 23, 2012:  61mo ROV & add-on for refractory URI/AB> started weeks ago w/ nasal congestion, drainage, sore throat, hoarseness, cough w/ yellow sput, wheezing, SOB, "tight" etc;  Went to ER 01/12/12 denied f/c/s, no CP, noted +smoker w/ COPD/asthma; CXR showed mild cardiomeg & ectatic Ao, pulm vasc congestion, DJD sp, NAD;  CTA was neg for PE, some bronchial thickening, +atherosclerotic changes in Ao & coronaries;  Labs showed wnl x Hg=11.3 & WBC=5.3;  she was treated w/ Doxy Bid & Pred taper, asked to restart her Symbicort;  Had f/u w/ TP & asked to continue Rx, quit smoking, take the Symbicort 2sp Bid & restart Mucinex...  Now c/o persistent cough, beige sput, & hoarseness> we decided to Rx w/ Levaquin, Medrol Dosepak, MMW;  She wants refills Vicodin & Ambien...    >She saw DrCrenshaw 1/13 for f/u CAD> last Myoview & 2DEcho were inn 2010;  she's been stable on ASA81mg /d, Lasix, KCl, Prav40; she denies angina & DrCrenshaw rec quit smoking, same meds, lose weight...    >She saw [facility] 2/13 for f/u severe ven insuffic left leg w/ pain & chr swelling, bulging varicosities; using compression hose, no salt, elevation, & Ibuprofen w/o improvement;  He plans laser ablation of the GSV & then he'll consider stab phlebectomy of secondary varicosities later...  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    She reports f/u [facility] 4/13 for laser ablation of the left GSV==> f/u venous duplex confirmed total closure of the left GSV, no DVT, etc; she is feeling much better but she cannot wear the compression stocking she says...  ~  Apr 23, 2012:  41mo ROV & add-on for persistent back & left leg pain> see note from 03/26/12 visit- she was c/o recurrent pain in the right side of her back x1wk & we suggested Mobic/ Robaxin/ Heat & f/u w/ Ortho;  She states min improvement w/ meds & ?she saw ortho ?who and no change in rec for rest heat & anti-inflamm rx;  No better so she ret to see us> known DJD, DrBeane has done bilat TKRs, we will refer to [facility] for further eval of her back & left leg pain... For now she has Vicodin to take up to Tid as needed + Mobic & left over muscle relaxer... Review of EPIC Imaging section reveals XRays of knees w/ bilat TKRs by drBeane but no prev imaging of back etc...  ~  July 29, 2012:  80mo ROV & [name] notes that her prev back discomfort is better w/ prn Mobic & Vicodin (requests refills today)... Now c/o cramps in her legs & wonders if she needs to incr her potassium (we will check level=3.8);  She has Lasix40 but choses to take it intermittently noting that she doesn't pee as much if she takes one daily vis-a-vis 2 Qod;  Still on the K20 taking one daily;  Weight is down 14# to 234# today...    She had f/u VenDopplers by [facility] 5/13> no evid DVT, left GSV ablated, other VV noted; no reflux seen & the fitted her for knee high compression hose but they are "too hard to wear every day"...    We reviewed prob list, meds, xrays and labs> see below for updates >> LABS 8/13:  FLP- at goals on Prav40;  Chems- all wnl;  CBC- ok w/ Hg=11.8 MCV=81 Fe=49 (12%sat);  TSH=1.38... rec to start FeSo4 daily.           Problem List:  ALLERGIC RHINITIS (ICD-477.9) > on NASONEX, ASTEPRO, ZYRTEK, & allergy  shots- followed by DrVanWinkle... POSITIVE PPD (ICD-795.5) CIGARETTE SMOKER CHRONIC OBSTRUCTIVE ASTHMA UNSPECIFIED (ICD-493.20) - she had +allergy skin testing for dust mites and started on allergy shots per DrESL along w/ SYMBICORT 160- 2spBid, but she doesn't take anything regularly... +smoker but decreased from 1/2 ppd x 57yrs to 1-2 cig/d. ~  2/11: she reports +PPD on pre-employment testing> sent to health dept, CXR- neg, & started on INH 300mg /d x54mo (they never sent records for [place] to review). ~  CXR 2/11 showed borderline heart size, tort Ao, sl incr markings, NAD... ~  subseq CXRs done at health dept for her pos PPD but we don't have the films or reports... ~  2/13: Refractory AB treated via ER w/ Doxy, Pred, restart Symbicort, +Mucinex, etc; we decided upon Levaquin, Medrol, MMW... ~  CXR 2/13 showed mild cardiomeg, ectatic Ao & coronary calcif, DJD sp, NAD; CTAngio was neg for PE, +bronchial wall thickening, NAD.Marland Kitchen. ~  8/13: she tells me "I saw my asthma doctor last week- doing OK" no changes in meds from DrVanWinkle...  Hx of HYPERTENSION (ICD-401.9) - off Lisinopril/Hct (Hx ACE reaction) & taking LASIX 40mg  as needed for swelling... ~  EKG 6/12 showed SBrady w/ rate= 55, otherw WNL, NAD.Marland Kitchen. ~  5/13:  BP=120/78 today and denies HA, fatigue, visual changes, CP, palipit, dizziness, syncope, dyspnea, edema, etc... ~  8/13:  BP= 124/82 & she remains largely asymtomatic...  CAD (ICD-414.00) - known non-obstructive disease & atypic CP... on ASA 325mg /d, STATIN (Prav40), & off ACE. ~  cath 2/05 showed 20-60% obstructions in all 3 vessels... good LVF... ~  NuclearStressTest 3/09 was negative- no ischemia or infarction, EF=68%... ~  Eval for recurrent CP- EKG showed NSR, NAD;  2DEcho showed mild focal basal septal hypertrophy, norm wall motion w/ EF= 55-65%, mild DD & PAsys= 37;  Myoview NEG- no scar, no ischemia, EF=64%... ~  ROV w/ DrCrenshaw 7/11 reviewed- continue ASA/ Statin/ smoking  cessation/ monitor BP & Chol. ~  She saw DrCrenshaw 1/13 for f/u CAD> last Myoview & 2DEcho were in 2010; she's been stable on ASA81mg /d, Lasix, KCl, Prav40; she denies angina & DrCrenshaw rec quit smoking, same meds, lose weight... Note> aterosclerotic changes seen in Ao & coronaries on CTA 2/13..  VENOUS INSUFFICIENCY (ICD-459.81) - she follows a low salt diet... +LASIX 40mg /d for swelling... ~  1/10: we discussed no salt, Lasix20, elevation, support hose. ~  She was evaluated by [facility] w/ severe ven insuffic left leg w/ pain & chr swelling, bulging varicosities; using compression hose, no salt, elevation, & Ibuprofen w/o improvement;  He plans laser ablation of the GSV & then he'll consider stab phlebectomy of secondary varicosities later... ~  4/13: s/p left GSV laser ablation; she is feeling better w/ decr pain etc after the procedure... ~  5/13:  F/u VenDopplers by [facility] w/ good ablation, no evid DVT etc...  HYPERCHOLESTEROLEMIA (ICD-272.0) - on PRAVASTATIN 40mg /d now + diet efforts. ~  FLP 5/08 on Cres5 showed TChol 115, TG 83, HDL 50, LDL 49... tolerating well and taking med regularly. ~  FLP 3/09 on Cres5 showed TChol 126, TG 104, HDL 56, LDL 49... rec- same. ~  FLP 3/10 on Cres5 showed TChol 152, TG 97, HDL 77, LDL 55 ~  FLP 12/10 on Cres5 showed TChol 127, TG 100, HDL 52, LDL 55 ~  7/11:  pt requests change to less expensive statin> try PRAVASTATIN40 ~  FLP 10/11 on Prav40 showed TChol 143, TG 125, HDL 51, LDL 67... Continue same. ~  FLP 6/12 on Prav40 showed TChol 143, TG 145, HDL 61, LDL 53 ~  FLP 8/13 on Prav40 showed TChol 141, TG 92, HDL 63, LDL 60  OBESITY (ICD-278.00) - unable to diet effectively and get weight down...  ~  weight 5/10 = 238#, 5\' 5"   tall,  BMI= 40... we discussed diet and exercise program... again! ~  weight 1/11 = 228# ~  weight 7/11 = 232# ~  weight 10/11 = 230# ~  Weight 6/12 = 232# ~  Weight 10/12 = 234# ~  Weight 2/13 = 241# ~  Weight 4/13 =  248#... She is asked to get wt down!!! ~  Weight 8/13 = 234#... Great job!  GERD (ICD-530.81) - EGD 7/05 by DrStark w/ HH- supposed to be on PPI (Omeprazole 20mg ) & H2 blocker (Pepcid 20mg ) at bedtime... ~  1/10: she stopped above meds & is recommended to restart Rx. ~  1/11:  still not taking acid suppression meds "I use them as needed"  COLONIC POLYPS (ICD-211.3) - last colonoscopy was 12/03 showing several 3-89mm polyps (hyperplastic)...  HX OF GALLSTONE (ICD-V12.79) - seen on CT 7/06 and referred to [facility]- eval by DrCornett and being followed...  DEGENERATIVE JOINT DISEASE (ICD-715.90) - followed by DrBeane for ortho... Right knee arthroscopy 1/09 without help... s/p right TKR 06/12/08 by Tora Perches... 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 she is off the Etodolac & takes PERCOCET Prn... ~  1/11: now complaining of left knee pain & will f/u w/ DrBeane... ~  6/12:  DrBeane plans left TKR soon... OK for surg... ~  7/12: s/p left TKR per DrBeane & went to [facility] for rehab... ~  8/13:  She requests refill MOBIC 15mg  as needed...  BACK PAIN, LUMBAR (ICD-724.2) >> presented 4/13 w/ pain in left side of back & radiating down leg leg... Given Vicodin, anti-inflamm med & muscle relaxer;  Referred to Ortho for further eval==> ?if she ever saw Ortho, pain resolved w/ Mobic, Norco, rest, heat, etc...  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   Past Surgical History  Procedure Date  . Abdominal hysterectomy   . Cspine surgery 1993    for arnold-chiari malformation  . Right knee arthroscopy 12/2007    Dr. Shelle Iron  . 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 Shelle Iron  . Joint replacement 06/05/11    Left total knee replacement     Outpatient Encounter Prescriptions as of 07/29/2012  Medication Sig Dispense Refill  . albuterol (PROVENTIL HFA;VENTOLIN HFA) 108 (90 BASE) MCG/ACT inhaler Inhale 2 puffs into the lungs every 4 (four) hours as needed. For wheeze or shortness of breath  1 Inhaler  6  . aspirin 81 MG tablet Take 81 mg by mouth daily.        . budesonide-formoterol (SYMBICORT) 160-4.5 MCG/ACT inhaler Inhale 2 puffs into the lungs 2 (two) times daily.  1 Inhaler  5  . furosemide (LASIX) 40 MG tablet TAKE 1 TABLET (40 MG TOTAL) BY MOUTH DAILY.  30 tablet  5  . KLOR-CON M20 20 MEQ tablet TAKE 1 TABLET (20 MEQ TOTAL) BY MOUTH DAILY.  30 tablet  5  . meloxicam (MOBIC) 15 MG tablet Take 1 tablet (15 mg total) by mouth daily.  30 tablet  2  . mometasone (NASONEX) 50 MCG/ACT nasal spray Place 2 sprays into the nose daily.  17 g  5  . Multiple Vitamin (MULTIVITAMIN) capsule Take 1 capsule by mouth daily.        . pravastatin (PRAVACHOL) 40 MG tablet TAKE 1 TABLET (40 MG TOTAL)  BY MOUTH DAILY.  30 tablet  0  . zolpidem (AMBIEN) 10 MG tablet Take 1 tablet (10 mg total) by mouth daily.  30 tablet  5  . DISCONTD: pravastatin (PRAVACHOL) 40 MG tablet Take 1 tablet (40 mg total) by mouth daily.  30 tablet  11    No Known Allergies   Current Medications, Allergies, Past Medical History, Past Surgical History, Family History, and Social History were reviewed in [employer] record.    Review of Systems         See HPI - all other systems neg except as noted... The patient complains of dyspnea on exertion & gait abn.  The patient denies anorexia, fever, weight loss, weight gain, vision loss, decreased hearing, hoarseness, chest pain, syncope, worsening edema, prolonged cough, headaches, hemoptysis, abdominal pain, melena, hematochezia, severe indigestion/heartburn, hematuria, incontinence, muscle weakness, suspicious skin lesions, transient blindness, depression, unusual weight change, abnormal  bleeding, enlarged lymph nodes, and angioedema.     Objective:   Physical Exam      WD, Overweight, 71 y/o BF in NAD... GENERAL:  Alert & oriented; pleasant & cooperative... HEENT:  [place]/AT, EOM-wnl, PERRLA, EACs-clear, TMs-wnl, NOSE-clear, THROAT-clear & wnl, no lesions seen; VOICE- sl hoarse. NECK:  Supple w/ decrROM; no JVD; normal carotid impulses w/o bruits; no thyromegaly or nodules palpated; no lymphadenopathy. CHEST:  Few scat rhonchi, mild congestion & exp wheezing, no consolidation... HEART:  Regular Rhythm, gr 1/6 SEM without rubs or gallops... ABDOMEN:  Obese, soft & nontender; normal bowel sounds; no organomegaly or masses detected. EXT:  s/p right TKR, mod arthritic changes; +varicose veins (s/p laser ablation left GSV 4/13)/ +venous insuffic/ 1+ edema... NEURO:  CN's intact; no focal neuro deficits... DERM:  No lesions noted; no rash etc...  RADIOLOGY DATA:  Reviewed in the EPIC EMR & discussed w/ the patient...  LABORATORY DATA:  Reviewed in the EPIC EMR & discussed w/ the patient...   Assessment & Plan:    Hx Asthma/ COPD/ AB, smoker (?ex), & hx pos PPD- treated> She's had refractory "attack" & treated w/ Levaquin 500mg /d, Medrol dosepak, Mucinex 2Bid, MMW, etc... Must quit all smoking & stay quit!  If hoarseness persists then refer to ENT...  HBP>  BP controlled on diet + Lasix Rx...  CAD>  Denies CP, angina, etc;  On ASA, statin, off cigs, etc...  Ven Insuffic>  On sodium restriction, elevation, support hose; also on LASIX to 40mg /d & followed by [facility] & s/p laser ablation of left GSV- improved.  CHOL>  Well controlled on her Statin med but what happened betw Ortho hosp & NH disch? We had her on Prav40 w/ good labs but not on hosp formulary & the ortho PA didn't pay attn to med reconciliation & wrote for Crestor5; who knows what happened in the NH? Now she should restart our prev therapy ==> PRAVASTATIN 40mg /d & f/u FLP looks good on this...  OBESITY>  Needs  better diet & hopefully incr exerc after the knee surg...  GI>  GERD, Polyps, Gallstone> follwed by DrStark & stable on Omep20 but uses prn only...  S/P left TKR 7/12 by DrBeane>  Course complic by bleed into left calf; now stable & improving w/ PT etc...  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 She will need Ortho referral for further eval...  Other medical issues as noted.Marland KitchenMarland Kitchen  Patient's Medications  New Prescriptions   No medications on file  Previous Medications   ALBUTEROL (PROVENTIL HFA;VENTOLIN HFA) 108 (90 BASE) MCG/ACT INHALER    Inhale 2 puffs into the lungs every 4 (four) hours as needed. For wheeze or shortness of breath   ASPIRIN 81 MG TABLET    Take 81 mg by mouth daily.     BUDESONIDE-FORMOTEROL (SYMBICORT) 160-4.5 MCG/ACT INHALER    Inhale 2 puffs into the lungs 2 (two) times daily.   FUROSEMIDE (LASIX) 40 MG TABLET    TAKE 1 TABLET (40 MG TOTAL) BY MOUTH DAILY.   KLOR-CON M20 20 MEQ TABLET    TAKE 1 TABLET (20 MEQ TOTAL) BY MOUTH DAILY.   MELOXICAM (MOBIC) 15 MG TABLET    Take 1 tablet (15 mg total) by mouth daily.   MOMETASONE (NASONEX) 50 MCG/ACT NASAL SPRAY    Place 2 sprays into the nose daily.   MULTIPLE VITAMIN (MULTIVITAMIN) CAPSULE    Take 1 capsule by mouth daily.     PRAVASTATIN (PRAVACHOL) 40 MG TABLET    TAKE 1 TABLET (40 MG TOTAL) BY MOUTH DAILY.   ZOLPIDEM (AMBIEN) 10 MG TABLET    Take 1 tablet (10 mg total) by mouth daily.  Modified Medications   No medications on file  Discontinued Medications   No medications on file

## 2012-07-29 NOTE — Patient Instructions (Addendum)
Today we updated your med list in our EPIC system...    Continue your current medications the same...    We refilled your [facility] & VICODIN per request...  Today we did your follow up FASTING blood work...    We will call you w/ the results when avail...  Keep up the good work on your diet & exercise program...    The goal is to lose 20 lbs!!!  Call for any questions...  Let's plan a follow up visit in 6 months.Marland KitchenMarland Kitchen

## 2012-08-03 ENCOUNTER — Telehealth: Payer: Self-pay | Admitting: Pulmonary Disease

## 2012-08-03 NOTE — Telephone Encounter (Signed)
Please notify patient> FLP looks good on Prav40> continue same... Chems, TSH> wnl... CBC shows mild anemia & borderline Fe> rec OTC FeSO4 one tab daily   lmomtcb x1

## 2012-08-04 NOTE — Telephone Encounter (Signed)
lmomtcb  

## 2012-08-05 NOTE — Telephone Encounter (Signed)
lmomtcb x 2  

## 2012-08-06 NOTE — Telephone Encounter (Signed)
Pt has already been giving results 

## 2012-09-04 ENCOUNTER — Other Ambulatory Visit: Payer: Self-pay | Admitting: Pulmonary Disease

## 2012-09-27 ENCOUNTER — Encounter: Payer: Self-pay | Admitting: Gastroenterology

## 2012-09-27 ENCOUNTER — Telehealth: Payer: Self-pay | Admitting: Pulmonary Disease

## 2012-09-27 NOTE — Telephone Encounter (Signed)
Pt c/o cough, which has gotten much better on Mucinex. Sputum is white to clear and she denies any increased sob, wheezing, chest tightness or pain, fever, sore throat or allergy sxs. Told the pt to continue on the mucinex to help thin those secretions and to drink plenty of water. She should call our office if she has any new sxs or if her cough becomes worse. Pt verbalized understanding.

## 2012-10-05 ENCOUNTER — Telehealth: Payer: Self-pay | Admitting: Pulmonary Disease

## 2012-10-05 NOTE — Telephone Encounter (Signed)
Called and spoke with pt and she stated that she is still coughing with white sputum.  She stated that she is still on the mucinex  And she wanted to make sure that this is going to clear up. Pt stated that she is coughing less and less each day and with less sputum but the sputum is still clear.  i advised the pt that the mucinex should help clear this up and she should cont to take this medication.  Pt voiced her understanding and will call back for any further recs

## 2012-10-08 ENCOUNTER — Other Ambulatory Visit: Payer: Self-pay | Admitting: Pulmonary Disease

## 2012-10-18 ENCOUNTER — Encounter: Payer: Self-pay | Admitting: Gastroenterology

## 2012-11-06 ENCOUNTER — Other Ambulatory Visit: Payer: Self-pay | Admitting: Pulmonary Disease

## 2012-11-22 ENCOUNTER — Encounter: Payer: Self-pay | Admitting: Gastroenterology

## 2012-11-22 ENCOUNTER — Ambulatory Visit (AMBULATORY_SURGERY_CENTER): Payer: Medicare Other | Admitting: *Deleted

## 2012-11-22 VITALS — Ht 65.0 in | Wt 238.8 lb

## 2012-11-22 DIAGNOSIS — Z1211 Encounter for screening for malignant neoplasm of colon: Secondary | ICD-10-CM

## 2012-11-22 MED ORDER — MOVIPREP 100 G PO SOLR: ORAL | Status: DC

## 2012-11-29 ENCOUNTER — Encounter: Payer: Self-pay | Admitting: Gastroenterology

## 2012-12-02 ENCOUNTER — Encounter: Payer: Medicare Other | Admitting: Gastroenterology

## 2012-12-08 ENCOUNTER — Emergency Department (HOSPITAL_COMMUNITY)
Admission: EM | Admit: 2012-12-08 | Discharge: 2012-12-08 | Disposition: A | Discharge status: Home / Self Care | Payer: Medicare Other | Attending: Emergency Medicine | Admitting: Emergency Medicine

## 2012-12-08 ENCOUNTER — Encounter (HOSPITAL_COMMUNITY): Payer: Self-pay | Admitting: Emergency Medicine

## 2012-12-08 DIAGNOSIS — Q054 Unspecified spina bifida with hydrocephalus: Secondary | ICD-10-CM | POA: Insufficient documentation

## 2012-12-08 DIAGNOSIS — H01006 Unspecified blepharitis left eye, unspecified eyelid: Secondary | ICD-10-CM

## 2012-12-08 DIAGNOSIS — Z8679 Personal history of other diseases of the circulatory system: Secondary | ICD-10-CM | POA: Insufficient documentation

## 2012-12-08 DIAGNOSIS — H01009 Unspecified blepharitis unspecified eye, unspecified eyelid: Secondary | ICD-10-CM | POA: Insufficient documentation

## 2012-12-08 DIAGNOSIS — Z79899 Other long term (current) drug therapy: Secondary | ICD-10-CM | POA: Insufficient documentation

## 2012-12-08 DIAGNOSIS — J4489 Other specified chronic obstructive pulmonary disease: Secondary | ICD-10-CM | POA: Insufficient documentation

## 2012-12-08 DIAGNOSIS — Z860100 Personal history of colon polyps, unspecified: Secondary | ICD-10-CM | POA: Insufficient documentation

## 2012-12-08 DIAGNOSIS — IMO0002 Reserved for concepts with insufficient information to code with codable children: Secondary | ICD-10-CM | POA: Insufficient documentation

## 2012-12-08 DIAGNOSIS — I2510 Atherosclerotic heart disease of native coronary artery without angina pectoris: Secondary | ICD-10-CM | POA: Insufficient documentation

## 2012-12-08 DIAGNOSIS — Z8669 Personal history of other diseases of the nervous system and sense organs: Secondary | ICD-10-CM | POA: Insufficient documentation

## 2012-12-08 DIAGNOSIS — H00019 Hordeolum externum unspecified eye, unspecified eyelid: Secondary | ICD-10-CM | POA: Insufficient documentation

## 2012-12-08 DIAGNOSIS — Z7982 Long term (current) use of aspirin: Secondary | ICD-10-CM | POA: Insufficient documentation

## 2012-12-08 DIAGNOSIS — Z8719 Personal history of other diseases of the digestive system: Secondary | ICD-10-CM | POA: Insufficient documentation

## 2012-12-08 DIAGNOSIS — E669 Obesity, unspecified: Secondary | ICD-10-CM | POA: Insufficient documentation

## 2012-12-08 DIAGNOSIS — J449 Chronic obstructive pulmonary disease, unspecified: Secondary | ICD-10-CM | POA: Insufficient documentation

## 2012-12-08 DIAGNOSIS — I10 Essential (primary) hypertension: Secondary | ICD-10-CM | POA: Insufficient documentation

## 2012-12-08 DIAGNOSIS — E7800 Pure hypercholesterolemia, unspecified: Secondary | ICD-10-CM | POA: Insufficient documentation

## 2012-12-08 DIAGNOSIS — J309 Allergic rhinitis, unspecified: Secondary | ICD-10-CM | POA: Insufficient documentation

## 2012-12-08 DIAGNOSIS — Z87891 Personal history of nicotine dependence: Secondary | ICD-10-CM | POA: Insufficient documentation

## 2012-12-08 MED ORDER — PROPARACAINE HCL 0.5 % OP SOLN
1.0000 [drp] | Freq: Once | OPHTHALMIC | Status: AC
  Administered 2012-12-08: 1 [drp] via OPHTHALMIC
  Filled 2012-12-08: qty 15

## 2012-12-08 MED ORDER — FLUORESCEIN SODIUM 1 MG OP STRP
1.0000 | ORAL_STRIP | Freq: Once | OPHTHALMIC | Status: DC
  Filled 2012-12-08: qty 1

## 2012-12-08 NOTE — ED Notes (Signed)
Patient complaining of left eye irritation that began yesterday evening; complaining of eye drainage (clear), itchiness, and pain.

## 2012-12-08 NOTE — ED Provider Notes (Signed)
History     CSN: [id number]  Arrival date & time 12/08/12  0504   First MD Initiated Contact with Patient 12/08/12 0541      Chief Complaint  Patient presents with  . Eye Pain    (Consider location/radiation/quality/duration/timing/severity/associated sxs/prior treatment) HPI [name] is a 72 y.o. female presenting with some itchy left eye pain. She says this is irritated up on her left upper lid was associated with some clear drainage, this started last night, the pain is more of an itchiness is mild to moderate and is somewhat relieved by hot compresses. There is no visual changes, no blurred vision, no crusting of the eyes, no exudate, no pain when moving the eye. There is no surrounding erythema or swelling of the eye. There is no pain on chewing, no headache associated with this.  Past Medical History  Diagnosis Date  . Allergic rhinitis   . Positive PPD   . COPD (chronic obstructive pulmonary disease)   . Hypertension   . CAD (coronary artery disease)   . Venous insufficiency   . Hypercholesteremia   . Obesity   . GERD (gastroesophageal reflux disease)   . Hx of colonic polyps   . DJD (degenerative joint disease)   . Arnold-Chiari malformation   . History of absence seizures   . Reflex sympathetic dystrophy   . Varicose veins     Past Surgical History  Procedure Date  . Abdominal hysterectomy   . Cspine surgery 1993    for arnold-chiari malformation  . Right knee arthroscopy 12/2007    Dr. Shelle Iron  . 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 Shelle Iron  . Joint replacement 06/05/11    Left total knee replacement    Family History  Problem Relation Age of Onset  . Colon cancer Neg Hx   . Stomach cancer Neg Hx     History  Substance Use Topics  . Smoking status: Former Smoker -- 12 years    Types: Cigarettes    Quit date: 01/02/2012  . Smokeless tobacco: Never Used     Comment: 1 pack per week  . Alcohol Use: No    OB History    Grav Para Term Preterm  Abortions TAB SAB Ect Mult Living                  Review of Systems At least 10pt or greater review of systems completed and are negative except where specified in the HPI.  Allergies  Review of patient's allergies indicates no known allergies.  Home Medications   Current Outpatient Rx  Name  Route  Sig  Dispense  Refill  . ALBUTEROL SULFATE HFA 108 (90 BASE) MCG/ACT IN AERS   Inhalation   Inhale 2 puffs into the lungs every 4 (four) hours as needed. For wheeze or shortness of breath   1 Inhaler   6   . ASPIRIN 81 MG PO TABS   Oral   Take 81 mg by mouth daily.           . BUDESONIDE-FORMOTEROL FUMARATE 160-4.5 MCG/ACT IN AERO   Inhalation   Inhale 2 puffs into the lungs 2 (two) times daily.   1 Inhaler   5   . FUROSEMIDE 40 MG PO TABS      TAKE 1 TABLET (40 MG TOTAL) BY MOUTH DAILY.   30 tablet   5   . KLOR-CON M20 20 MEQ PO TBCR      TAKE 1  TABLET (20 MEQ TOTAL) BY MOUTH DAILY.   30 tablet   5   . MELOXICAM 15 MG PO TABS   Oral   Take 1 tablet (15 mg total) by mouth daily. As needed for arthritis pain   30 tablet   6   . MOMETASONE FUROATE 50 MCG/ACT NA SUSP   Nasal   Place 2 sprays into the nose daily.   17 g   5   . MULTIVITAMINS PO CAPS   Oral   Take 1 capsule by mouth daily.           Marland Kitchen PRAVASTATIN SODIUM 40 MG PO TABS      TAKE 1 TABLET (40 MG TOTAL) BY MOUTH DAILY.   30 tablet   2   . ZOLPIDEM TARTRATE 10 MG PO TABS   Oral   Take 1 tablet (10 mg total) by mouth daily.   30 tablet   5   . MOVIPREP 100 G PO SOLR      moviprep as directed. No substitutions   1 kit   0     Dispense as written.     BP 128/81  Pulse 52  Temp 98.4 F (36.9 C) (Oral)  Resp 16  SpO2 100%  Physical Exam  Nursing notes reviewed.  Electronic medical record reviewed. VITAL SIGNS:   Filed Vitals:   12/08/12 0531  BP: 128/81  Pulse: 52  Temp: 98.4 F (36.9 C)  TempSrc: Oral  Resp: 16  SpO2: 100%   CONSTITUTIONAL: Awake, oriented,  appears non-toxic HENT: Atraumatic, normocephalic, oral mucosa pink and moist, airway patent. Nares patent without drainage. External ears normal. EYES: Conjunctiva mildly injected in the left eye. Mild upper lid blepharitis of the left eyelid with stye in the left outer lid., EOMI, PERRLA NECK: Trachea midline, non-tender, supple CARDIOVASCULAR: Normal heart rate, Normal rhythm, No murmurs, rubs, gallops PULMONARY/CHEST: Clear to auscultation, no rhonchi, wheezes, or rales. Symmetrical breath sounds. Non-tender. NEUROLOGIC: Non-focal, moving all four extremities, no gross sensory or motor deficits. EXTREMITIES: No clubbing, cyanosis, or edema SKIN: Warm, Dry, No erythema, No rash  ED Course  Procedures (including critical care time)  Labs Reviewed - No data to display No results found.   1. Stye   2. Blepharitis of left eye       MDM  [name] is a 72 y.o. female resenting with stye to the left eye. This is likely caused her itching and discomfort. There is a mild blepharitis on the left eyelid secondary to inflammation of the stye. She is instructed to continue hot compresses. Antibiotics not indicated at this time, do not think this is a preseptal cellulitis no pain on eye movement, this is a benign condition at this time.    I explained the diagnosis and have given explicit precautions to return to the ER including worsening swelling, erythema, pain on movement of the eye, visual changes or any other new or worsening symptoms. The patient understands and accepts the medical plan as it's been dictated and I have answered their questions. Discharge instructions concerning home care and prescriptions have been given.  The patient is STABLE and is discharged to home in good condition.           Jones Skene, MD 12/10/12 [phone]

## 2012-12-08 NOTE — ED Notes (Signed)
The patient is AOx4 and comfortable with her discharge instructions. 

## 2012-12-13 ENCOUNTER — Ambulatory Visit (INDEPENDENT_AMBULATORY_CARE_PROVIDER_SITE_OTHER): Payer: Medicare Other | Admitting: Cardiology

## 2012-12-13 ENCOUNTER — Encounter: Payer: Self-pay | Admitting: Cardiology

## 2012-12-13 VITALS — BP 140/82 | HR 49 | Wt 232.0 lb

## 2012-12-13 DIAGNOSIS — E669 Obesity, unspecified: Secondary | ICD-10-CM

## 2012-12-13 DIAGNOSIS — I2510 Atherosclerotic heart disease of native coronary artery without angina pectoris: Secondary | ICD-10-CM

## 2012-12-13 DIAGNOSIS — F17200 Nicotine dependence, unspecified, uncomplicated: Secondary | ICD-10-CM

## 2012-12-13 DIAGNOSIS — E7800 Pure hypercholesterolemia, unspecified: Secondary | ICD-10-CM

## 2012-12-13 DIAGNOSIS — I10 Essential (primary) hypertension: Secondary | ICD-10-CM

## 2012-12-13 NOTE — Assessment & Plan Note (Signed)
Patient has discontinued. 

## 2012-12-13 NOTE — Patient Instructions (Addendum)
Your physician wants you to follow-up in: ONE YEAR WITH DR CRENSHAW You will receive a reminder letter in the mail two months in advance. If you don't receive a letter, please call our office to schedule the follow-up appointment.  

## 2012-12-13 NOTE — Assessment & Plan Note (Signed)
Continue statin. Lipids and liver monitored by primary care. 

## 2012-12-13 NOTE — Assessment & Plan Note (Signed)
Patient counseled on weight loss. 

## 2012-12-13 NOTE — Assessment & Plan Note (Signed)
Continue aspirin and statin. 

## 2012-12-13 NOTE — Progress Notes (Signed)
HPI: [name] is a pleasant female who has a history of coronary disease. She had a cardiac catheterization on January 18, 2004. At that time she was found to have mild irregularities in the left main. There was a 40% stenosis in the proximal LAD and a distal 30% lesion. The first diagonal had a 30-40% proximal lesion. The ostium of the LAD by intravascular ultrasound had a luminal area of 3.9 mm squared and percent stenosis 60%. She had nonobstructive disease in the left circumflex and right coronary. Her LV function was normal. Her last Myoview was in Dec 2010 and showed no significant abnormality and an ejection fraction was 64%. An echocardiogram was also performed in December of 2010 and revealed normal LV function and mild left atrial enlargement. I last saw her in Jan 2013. Since then the patient has dyspnea with more extreme activities but not with routine activities. It is relieved with rest. It is not associated with chest pain. There is no orthopnea, PND or pedal edema. There is no syncope or palpitations. There is no exertional chest pain.   Current Outpatient Prescriptions  Medication Sig Dispense Refill  . albuterol (PROVENTIL HFA;VENTOLIN HFA) 108 (90 BASE) MCG/ACT inhaler Inhale 2 puffs into the lungs every 4 (four) hours as needed. For wheeze or shortness of breath  1 Inhaler  6  . aspirin 81 MG tablet Take 81 mg by mouth daily.        . budesonide-formoterol (SYMBICORT) 160-4.5 MCG/ACT inhaler Inhale 2 puffs into the lungs 2 (two) times daily.  1 Inhaler  5  . furosemide (LASIX) 40 MG tablet TAKE 1 TABLET (40 MG TOTAL) BY MOUTH DAILY.  30 tablet  5  . KLOR-CON M20 20 MEQ tablet TAKE 1 TABLET (20 MEQ TOTAL) BY MOUTH DAILY.  30 tablet  5  . meloxicam (MOBIC) 15 MG tablet Take 1 tablet (15 mg total) by mouth daily. As needed for arthritis pain  30 tablet  6  . mometasone (NASONEX) 50 MCG/ACT nasal spray Place 2 sprays into the nose daily.  17 g  5  . Multiple Vitamin (MULTIVITAMIN)  capsule Take 1 capsule by mouth daily.        . pravastatin (PRAVACHOL) 40 MG tablet TAKE 1 TABLET (40 MG TOTAL) BY MOUTH DAILY.  30 tablet  2  . zolpidem (AMBIEN) 10 MG tablet Take 1 tablet (10 mg total) by mouth daily.  30 tablet  5     Past Medical History  Diagnosis Date  . Allergic rhinitis   . Positive PPD   . COPD (chronic obstructive pulmonary disease)   . Hypertension   . CAD (coronary artery disease)   . Venous insufficiency   . Hypercholesteremia   . Obesity   . GERD (gastroesophageal reflux disease)   . Hx of colonic polyps   . DJD (degenerative joint disease)   . Arnold-Chiari malformation   . History of absence seizures   . Reflex sympathetic dystrophy   . Varicose veins     Past Surgical History  Procedure Date  . Abdominal hysterectomy   . Cspine surgery 1993    for arnold-chiari malformation  . Right knee arthroscopy 12/2007    Dr. Shelle Iron  . 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 Shelle Iron  . Joint replacement 06/05/11    Left total knee replacement    History   Social History  . Marital Status: Widowed    Spouse Name: N/A    Number of  Children: 2  . Years of Education: N/A   Occupational History  . retired    Social History Main Topics  . Smoking status: Former Smoker -- 12 years    Types: Cigarettes    Quit date: 01/02/2012  . Smokeless tobacco: Never Used     Comment: 1 pack per week  . Alcohol Use: No  . Drug Use: No  . Sexually Active: Not on file   Other Topics Concern  . Not on file   Social History Narrative  . No narrative on file    ROS: no fevers or chills, productive cough, hemoptysis, dysphasia, odynophagia, melena, hematochezia, dysuria, hematuria, rash, seizure activity, orthopnea, PND, pedal edema, claudication. Remaining systems are negative.  Physical Exam: Well-developed obese in no acute distress.  Skin is warm and dry.  HEENT is normal.  Neck is supple.  Chest is clear to auscultation with normal expansion.    Cardiovascular exam is regular rate and rhythm.  Abdominal exam nontender or distended. No masses palpated. Extremities show trace edema. neuro grossly intact  ECG marked sinus bradycardia at a rate of 49. Nonspecific ST-T wave changes.

## 2012-12-13 NOTE — Assessment & Plan Note (Signed)
Blood pressure controlled. Continue present medications. 

## 2012-12-20 ENCOUNTER — Other Ambulatory Visit: Payer: Self-pay | Admitting: Pulmonary Disease

## 2012-12-23 ENCOUNTER — Telehealth: Payer: Self-pay | Admitting: Pulmonary Disease

## 2012-12-23 MED ORDER — ZOLPIDEM TARTRATE 10 MG PO TABS: 10.0000 mg | ORAL_TABLET | Freq: Every day | ORAL | Status: DC

## 2012-12-23 NOTE — Telephone Encounter (Signed)
Rx has been phoned into [facility] for pt. Nothing further was needed

## 2013-01-28 ENCOUNTER — Encounter: Payer: Self-pay | Admitting: Pulmonary Disease

## 2013-01-28 ENCOUNTER — Ambulatory Visit (INDEPENDENT_AMBULATORY_CARE_PROVIDER_SITE_OTHER): Payer: Medicare Other | Admitting: Pulmonary Disease

## 2013-01-28 VITALS — BP 122/80 | HR 57 | Temp 98.2°F | Ht 65.0 in | Wt 231.8 lb

## 2013-01-28 DIAGNOSIS — Q054 Unspecified spina bifida with hydrocephalus: Secondary | ICD-10-CM

## 2013-01-28 DIAGNOSIS — J449 Chronic obstructive pulmonary disease, unspecified: Secondary | ICD-10-CM

## 2013-01-28 DIAGNOSIS — I2510 Atherosclerotic heart disease of native coronary artery without angina pectoris: Secondary | ICD-10-CM

## 2013-01-28 DIAGNOSIS — E7800 Pure hypercholesterolemia, unspecified: Secondary | ICD-10-CM

## 2013-01-28 DIAGNOSIS — J4489 Other specified chronic obstructive pulmonary disease: Secondary | ICD-10-CM

## 2013-01-28 DIAGNOSIS — I83893 Varicose veins of bilateral lower extremities with other complications: Secondary | ICD-10-CM

## 2013-01-28 DIAGNOSIS — K219 Gastro-esophageal reflux disease without esophagitis: Secondary | ICD-10-CM

## 2013-01-28 DIAGNOSIS — D126 Benign neoplasm of colon, unspecified: Secondary | ICD-10-CM

## 2013-01-28 DIAGNOSIS — G9050 Complex regional pain syndrome I, unspecified: Secondary | ICD-10-CM

## 2013-01-28 DIAGNOSIS — M1990 Unspecified osteoarthritis, unspecified site: Secondary | ICD-10-CM

## 2013-01-28 DIAGNOSIS — I10 Essential (primary) hypertension: Secondary | ICD-10-CM

## 2013-01-28 DIAGNOSIS — M545 Low back pain, unspecified: Secondary | ICD-10-CM

## 2013-01-28 DIAGNOSIS — E669 Obesity, unspecified: Secondary | ICD-10-CM

## 2013-01-28 NOTE — Patient Instructions (Addendum)
Today we updated your med list in our EPIC system...    Continue your current medications the same...  Call for any questions...  Let's plan a follow up visit in 6 months w/ FASTING blood work at that time.Marland KitchenMarland Kitchen

## 2013-01-28 NOTE — Progress Notes (Signed)
Subjective:    Patient ID: [name], female    DOB: 09-12-1941, 72 y.o.   MRN: [id number]  HPI 72 y/o BF here for a follow up of her mult med problems including: Allergies, HBP, CAD, Chol, Obesity, etc... she is also followed by DrCrenshaw for Cards, & DrESL for allergies...  ~  January 23, 2012:  109mo ROV & add-on for refractory URI/AB> started weeks ago w/ nasal congestion, drainage, sore throat, hoarseness, cough w/ yellow sput, wheezing, SOB, "tight" etc;  Went to ER 01/12/12 denied f/c/s, no CP, noted +smoker w/ COPD/asthma; CXR showed mild cardiomeg & ectatic Ao, pulm vasc congestion, DJD sp, NAD;  CTA was neg for PE, some bronchial thickening, +atherosclerotic changes in Ao & coronaries;  Labs showed wnl x Hg=11.3 & WBC=5.3;  she was treated w/ Doxy Bid & Pred taper, asked to restart her Symbicort;  Had f/u w/ TP & asked to continue Rx, quit smoking, take the Symbicort 2sp Bid & restart Mucinex...  Now c/o persistent cough, beige sput, & hoarseness> we decided to Rx w/ Levaquin, Medrol Dosepak, MMW;  She wants refills Vicodin & Ambien...    >She saw DrCrenshaw 1/13 for f/u CAD> last Myoview & 2DEcho were inn 2010; she's been stable on ASA81mg /d, Lasix, KCl, Prav40; she denies angina & DrCrenshaw rec quit smoking, same meds, lose weight...    >She saw [facility] 2/13 for f/u severe ven insuffic left leg w/ pain & chr swelling, bulging varicosities; using compression hose, no salt, elevation, & Ibuprofen w/o improvement;  He plans laser ablation of the GSV & then he'll consider stab phlebectomy of secondary varicosities later...  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    She reports f/u [facility] 4/13 for laser ablation of the left GSV==> f/u venous duplex confirmed total closure of the left GSV, no DVT, etc; she is feeling much better but she cannot wear the compression stocking she says...  ~  Apr 23, 2012:  79mo ROV & add-on for persistent back & left leg pain> see note from 03/26/12 visit- she was c/o recurrent pain in the right side of her back x1wk & we suggested Mobic/ Robaxin/ Heat & f/u w/ Ortho;  She states min improvement w/ meds & ?she saw ortho ?who and no change in rec for rest heat & anti-inflamm rx;  No better so she ret to see us> known DJD, DrBeane has done bilat TKRs, we will refer to [facility] for further eval of her back & left leg pain... For now she has Vicodin to take up to Tid as needed + Mobic & left over muscle relaxer... Review of EPIC Imaging section reveals XRays of knees w/ bilat TKRs by drBeane but no prev imaging of back etc...  ~  July 29, 2012:  28mo ROV & [place] notes that her prev back discomfort is better w/ prn Mobic & Vicodin (requests refills today)... Now c/o cramps in her legs &  wonders if she needs to incr her potassium (we will check level=3.8);  She has Lasix40 but choses to take it intermittently noting that she doesn't pee as much if she takes one daily vis-a-vis 2 Qod;  Still on the K20 taking one daily;  Weight is down 14# to 234# today...    She had f/u [facility]pplers [facility] 5/13> no evid DVT, left GSV ablated, other VV noted; no reflux seen & the fitted her for knee high compression hose but they are "too hard to wear every day"...    We reviewed prob list, meds, xrays and labs> see below for updates >> LABS 8/13:  FLP- at goals on Prav40;  Chems- all wnl;  CBC- ok w/ Hg=11.8 MCV=81 Fe=49 (12%sat);  TSH=1.38... rec to start FeSo4 daily.   ~  January 28, 2013:  43mo ROV & her CC is leg  discomfort for which she has been eval by Tora Perches- we do not have his notes but she reports XRays OK & she reports that steroid pack x8d really helped... We reviewed the following medical problems during today's office visit >>     COPD> smoker, hx pos PPD, AR> on Symbicort160, Proair prn, Nasonex prn; she says she quit smoking 80mo ago & she is encouraged to remain quit! She denies cough, sput, hemoptysis, ch in dyspnea, etc...    HBP> on Lasix40, K20; BP= 122/80 & she denies CP, palpit, dizzy, SOB, worsening edema...    CAD> on ASA81; followed by drCrenshaw & seen 1/14- stable & no changes made...    VI> she knows to elim sodium, elev legs, wear support hose, & take the Lasix40...    Chol> on Prav40; last FLP 8/13 looked good w/ TChol 141, TG 92, HDL 63, LDL 60    Overweight> wt= 213# which is down 2#, but her BMI is 39 & she has a long way to go...    GI- GERD, Polyps, known gallstones> on Omep20; she denies abd pain, dysphagia, n/v, c/d, blood seen...    DJD, s/p left TKR, LBP> on Mobic15, Aleve prn, Vicodin prn; managed by Ortho- DrBeane...    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    Arnold Chiari malformation & hx seizures> she had a suboccipital craniectomy and C1 laminectomy for Arnold-Chiari malformation by DrKritzer in 1993.     Insomnia> on Ambien10 prn... We reviewed prob list, meds, xrays and labs> see below for updates >>           Problem List:  ALLERGIC RHINITIS (ICD-477.9) > on NASONEX, ASTEPRO, ZYRTEK, & allergy shots- followed by DrVanWinkle... POSITIVE PPD (ICD-795.5) CIGARETTE SMOKER CHRONIC OBSTRUCTIVE ASTHMA UNSPECIFIED (ICD-493.20) - she had +allergy skin testing for dust mites and started on allergy shots per DrESL along w/ SYMBICORT 160- 2spBid, but she doesn't take anything regularly... +smoker but decreased from 1/2 ppd x 43yrs to 1-2 cig/d. ~  2/11: she reports +PPD on pre-employment testing> sent to health dept, CXR- neg, & started on INH  300mg /d x53mo (they never sent records for [place] to review). ~  CXR 2/11 showed borderline heart size, tort Ao, sl incr markings, NAD... ~  subseq CXRs done at health dept for her pos PPD but we don't have the films or reports... ~  2/13: Refractory AB treated via ER w/ Doxy, Pred, restart Symbicort, +Mucinex, etc; we decided upon Levaquin, Medrol, MMW... ~  CXR 2/13 showed mild cardiomeg, ectatic Ao & coronary calcif, DJD sp, NAD; CTAngio was  neg for PE, +bronchial wall thickening, NAD.Marland Kitchen. ~  8/13: she tells me "I saw my asthma doctor last week- doing OK" no changes in meds from DrVanWinkle... ~  2/14: on Symbicort160, Proair prn, Nasonex prn; she says she quit smoking 68mo ago & she is encouraged to remain quit! She denies cough, sput, hemoptysis, ch in dyspnea, etc.  Hx of HYPERTENSION (ICD-401.9) - off Lisinopril/Hct (Hx ACE reaction) & taking LASIX 40mg  as needed for swelling... ~  EKG 6/12 showed SBrady w/ rate= 55, otherw WNL, NAD.Marland Kitchen. ~  5/13:  BP=120/78 today and denies HA, fatigue, visual changes, CP, palipit, dizziness, syncope, dyspnea, edema, etc... ~  8/13:  BP= 124/82 & she remains largely asymtomatic... ~  2/14: on Lasix40, K20; BP= 122/80 & she denies CP, palpit, dizzy, SOB, worsening edema.   CAD (ICD-414.00) - known non-obstructive disease & atypic CP... on ASA 325mg /d, STATIN (Prav40), & off ACE. ~  cath 2/05 showed 20-60% obstructions in all 3 vessels... good LVF... ~  NuclearStressTest 3/09 was negative- no ischemia or infarction, EF=68%... ~  Eval for recurrent CP- EKG showed NSR, NAD;  2DEcho showed mild focal basal septal hypertrophy, norm wall motion w/ EF= 55-65%, mild DD & PAsys= 37;  Myoview NEG- no scar, no ischemia, EF=64%... ~  ROV w/ DrCrenshaw 7/11 reviewed- continue ASA/ Statin/ smoking cessation/ monitor BP & Chol. ~  She saw DrCrenshaw 1/13 for f/u CAD> last Myoview & 2DEcho were in 2010; she's been stable on ASA81mg /d, Lasix, KCl, Prav40; she denies angina &  DrCrenshaw rec quit smoking, same meds, lose weight... Note> atherosclerotic changes seen in Ao & coronaries on CTA 2/13.. ~  She had yearly f/u DrCrenshaw 1/14> felt to be stable, no changes made;  EKG showed SBrady, rate 49, NSSTTWA...  VENOUS INSUFFICIENCY (ICD-459.81) - she follows a low salt diet... +LASIX 40mg /d for swelling... ~  1/10: we discussed no salt, Lasix20, elevation, support hose. ~  She was evaluated by [facility] w/ severe ven insuffic left leg w/ pain & chr swelling, bulging varicosities; using compression hose, no salt, elevation, & Ibuprofen w/o improvement;  He plans laser ablation of the GSV & then he'll consider stab phlebectomy of secondary varicosities later... ~  4/13: s/p left GSV laser ablation; she is feeling better w/ decr pain etc after the procedure... ~  5/13:  F/u VenDopplers by [facility] w/ good ablation, no evid DVT etc...  HYPERCHOLESTEROLEMIA (ICD-272.0) - on PRAVASTATIN 40mg /d now + diet efforts. ~  FLP 5/08 on Cres5 showed TChol 115, TG 83, HDL 50, LDL 49... tolerating well and taking med regularly. ~  FLP 3/09 on Cres5 showed TChol 126, TG 104, HDL 56, LDL 49... rec- same. ~  FLP 3/10 on Cres5 showed TChol 152, TG 97, HDL 77, LDL 55 ~  FLP 12/10 on Cres5 showed TChol 127, TG 100, HDL 52, LDL 55 ~  7/11:  pt requests change to less expensive statin> try PRAVASTATIN40 ~  FLP 10/11 on Prav40 showed TChol 143, TG 125, HDL 51, LDL 67... Continue same. ~  FLP 6/12 on Prav40 showed TChol 143, TG 145, HDL 61, LDL 53 ~  FLP 8/13 on Prav40 showed TChol 141, TG 92, HDL 63, LDL 60  OBESITY (ICD-278.00) - unable to diet effectively and get weight down...  ~  weight 5/10 = 238#, 5\' 5"  tall,  BMI= 40... we discussed diet and exercise program... again! ~  weight 1/11 = 228# ~  weight 7/11 = 232# ~  weight 10/11 =  230# ~  Weight 6/12 = 232# ~  Weight 10/12 = 234# ~  Weight 2/13 = 241# ~  Weight 4/13 = 248#... She is asked to get wt down!!! ~  Weight 8/13 = 234#...  Great job! ~  [employer] 2/14 = 232#  GERD (ICD-530.81) - EGD 7/05 by DrStark w/ HH- supposed to be on PPI (Omeprazole 20mg ) & H2 blocker (Pepcid 20mg ) at bedtime... ~  2/14: she takes the Omep20 daily but using the Zantac as needed...  COLONIC POLYPS (ICD-211.3) - last colonoscopy was 12/03 showing several 3-30mm polyps (hyperplastic)...  HX OF GALLSTONE (ICD-V12.79) - seen on CT 7/06 and referred to [facility]- eval by DrCornett and being followed...  DEGENERATIVE JOINT DISEASE (ICD-715.90) - followed by DrBeane for ortho... Right knee arthroscopy 1/09 without help... s/p right TKR 06/12/08 by Tora Perches... 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 she is off the Etodolac & takes PERCOCET Prn... ~  1/11: now complaining of left knee pain & will f/u w/ DrBeane... ~  6/12:  DrBeane plans left TKR soon... OK for surg... ~  7/12: s/p left TKR per DrBeane & went to [facility] for rehab... ~  8/13:  She requests refill MOBIC 15mg  as needed...  BACK PAIN, LUMBAR (ICD-724.2) >> presented 4/13 w/ pain in left side of back & radiating down leg leg... Given Vicodin, anti-inflamm med & muscle relaxer;  Referred to Ortho for further eval==> ?if she ever saw Ortho, pain resolved w/ Mobic, Norco, rest, heat, etc...  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   Past Surgical History  Procedure Laterality Date  . Abdominal hysterectomy    . Cspine surgery  1993    for arnold-chiari malformation  . Right knee arthroscopy  12/2007    Dr. Shelle Iron  . 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 Shelle Iron  . Joint replacement  06/05/11    Left total knee replacement    Outpatient Encounter Prescriptions as of 01/28/2013  Medication Sig Dispense Refill  .  albuterol (PROVENTIL HFA;VENTOLIN HFA) 108 (90 BASE) MCG/ACT inhaler Inhale 2 puffs into the lungs every 4 (four) hours as needed. For wheeze or shortness of breath  1 Inhaler  6  . aspirin 81 MG tablet Take 81 mg by mouth daily.        . budesonide-formoterol (SYMBICORT) 160-4.5 MCG/ACT inhaler Inhale 2 puffs into the lungs 2 (two) times daily.  1 Inhaler  5  . furosemide (LASIX) 40 MG tablet TAKE 1 TABLET (40 MG TOTAL) BY MOUTH DAILY.  30 tablet  5  . KLOR-CON M20 20 MEQ tablet TAKE 1 TABLET (20 MEQ TOTAL) BY MOUTH DAILY.  30 tablet  5  . meloxicam (MOBIC) 15 MG tablet Take 1 tablet (15 mg total) by mouth daily. As needed for arthritis pain  30 tablet  6  . mometasone (NASONEX) 50 MCG/ACT nasal spray Place 2 sprays into the nose daily.  17 g  5  . Multiple Vitamin (MULTIVITAMIN) capsule Take 1 capsule by mouth daily.        Marland Kitchen omeprazole (PRILOSEC) 20 MG capsule Take 20 mg by mouth daily.      . pravastatin (PRAVACHOL) 40 MG tablet TAKE 1 TABLET (40 MG TOTAL) BY MOUTH DAILY.  30 tablet  2  .  zolpidem (AMBIEN) 10 MG tablet Take 1 tablet (10 mg total) by mouth daily.  30 tablet  4   No facility-administered encounter medications on file as of 01/28/2013.    No Known Allergies   Current Medications, Allergies, Past Medical History, Past Surgical History, Family History, and Social History were reviewed in [employer] record.    Review of Systems         See HPI - all other systems neg except as noted... The patient complains of dyspnea on exertion & gait abn.  The patient denies anorexia, fever, weight loss, weight gain, vision loss, decreased hearing, hoarseness, chest pain, syncope, worsening edema, prolonged cough, headaches, hemoptysis, abdominal pain, melena, hematochezia, severe indigestion/heartburn, hematuria, incontinence, muscle weakness, suspicious skin lesions, transient blindness, depression, unusual weight change, abnormal bleeding, enlarged lymph nodes, and  angioedema.     Objective:   Physical Exam      WD, Overweight, 72 y/o BF in NAD... GENERAL:  Alert & oriented; pleasant & cooperative... HEENT:  [place]/AT, EOM-wnl, PERRLA, EACs-clear, TMs-wnl, NOSE-clear, THROAT-clear & wnl, no lesions seen; VOICE- sl hoarse. NECK:  Supple w/ decrROM; no JVD; normal carotid impulses w/o bruits; no thyromegaly or nodules palpated; no lymphadenopathy. CHEST:  Few scat rhonchi, mild congestion & exp wheezing, no consolidation... HEART:  Regular Rhythm, gr 1/6 SEM without rubs or gallops... ABDOMEN:  Obese, soft & nontender; normal bowel sounds; no organomegaly or masses detected. EXT:  s/p right TKR, mod arthritic changes; +varicose veins (s/p laser ablation left GSV 4/13)/ +venous insuffic/ 1+ edema... NEURO:  CN's intact; no focal neuro deficits... DERM:  No lesions noted; no rash etc...  RADIOLOGY DATA:  Reviewed in the EPIC EMR & discussed w/ the patient...  LABORATORY DATA:  Reviewed in the EPIC EMR & discussed w/ the patient...   Assessment & Plan:    Hx Asthma/ COPD/ AB, smoker (?ex), & hx pos PPD- treated> She's had refractory "attack" & treated w/ Levaquin 500mg /d, Medrol dosepak, Mucinex 2Bid, MMW, etc... Must quit all smoking & stay quit!  If hoarseness persists then refer to ENT...  HBP>  BP controlled on diet + Lasix Rx...  CAD>  Denies CP, angina, etc;  On ASA, statin, off cigs, etc...  Ven Insuffic>  On sodium restriction, elevation, support hose; also on LASIX to 40mg /d & followed by [facility] & s/p laser ablation of left GSV- improved.  CHOL>  Back on Prav40 & FLP is improved...  OBESITY>  Needs better diet & hopefully incr exerc after the knee surg...  GI>  GERD, Polyps, Gallstone> follwed by DrStark & stable on Omep20 but uses prn only...  S/P left TKR 7/12 by DrBeane>  Course complic by bleed into left calf; now stable & improving w/ PT etc...  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 She will need Ortho referral for further eval...  Other medical issues as noted...   Patient's Medications  New Prescriptions   No medications on file  Previous Medications   ALBUTEROL (PROVENTIL HFA;VENTOLIN HFA) 108 (90 BASE) MCG/ACT INHALER    Inhale 2 puffs into the lungs every 4 (four) hours as needed. For wheeze or shortness of breath   ASPIRIN 81 MG TABLET    Take 81 mg by mouth daily.     BUDESONIDE-FORMOTEROL (SYMBICORT) 160-4.5 MCG/ACT INHALER    Inhale 2 puffs into the lungs  2 (two) times daily.   FUROSEMIDE (LASIX) 40 MG TABLET    TAKE 1 TABLET (40 MG TOTAL) BY MOUTH DAILY.   KLOR-CON M20 20 MEQ TABLET    TAKE 1 TABLET (20 MEQ TOTAL) BY MOUTH DAILY.   MELOXICAM (MOBIC) 15 MG TABLET    Take 1 tablet (15 mg total) by mouth daily. As needed for arthritis pain   MOMETASONE (NASONEX) 50 MCG/ACT NASAL SPRAY    Place 2 sprays into the nose daily.   MULTIPLE VITAMIN (MULTIVITAMIN) CAPSULE    Take 1 capsule by mouth daily.     OMEPRAZOLE (PRILOSEC) 20 MG CAPSULE    Take 20 mg by mouth daily.   PRAVASTATIN (PRAVACHOL) 40 MG TABLET    TAKE 1 TABLET (40 MG TOTAL) BY MOUTH DAILY.   ZOLPIDEM (AMBIEN) 10 MG TABLET    Take 1 tablet (10 mg total) by mouth daily.  Modified Medications   No medications on file  Discontinued Medications   No medications on file

## 2013-02-05 ENCOUNTER — Other Ambulatory Visit: Payer: Self-pay | Admitting: Pulmonary Disease

## 2013-02-28 ENCOUNTER — Other Ambulatory Visit (HOSPITAL_COMMUNITY): Payer: Self-pay | Admitting: Specialist

## 2013-02-28 DIAGNOSIS — T84038A Mechanical loosening of other internal prosthetic joint, initial encounter: Secondary | ICD-10-CM

## 2013-02-28 DIAGNOSIS — M25562 Pain in left knee: Secondary | ICD-10-CM

## 2013-02-28 DIAGNOSIS — Z96659 Presence of unspecified artificial knee joint: Secondary | ICD-10-CM

## 2013-03-03 ENCOUNTER — Encounter (HOSPITAL_COMMUNITY)
Admission: RE | Admit: 2013-03-03 | Discharge: 2013-03-03 | Disposition: A | Discharge status: Home / Self Care | Payer: Medicare Other | Source: Ambulatory Visit | Attending: Specialist | Admitting: Specialist

## 2013-03-03 DIAGNOSIS — M25569 Pain in unspecified knee: Secondary | ICD-10-CM | POA: Insufficient documentation

## 2013-03-03 DIAGNOSIS — T84038A Mechanical loosening of other internal prosthetic joint, initial encounter: Secondary | ICD-10-CM

## 2013-03-03 DIAGNOSIS — Z96659 Presence of unspecified artificial knee joint: Secondary | ICD-10-CM | POA: Insufficient documentation

## 2013-03-03 DIAGNOSIS — M25562 Pain in left knee: Secondary | ICD-10-CM

## 2013-03-03 MED ORDER — TECHNETIUM TC 99M MEDRONATE IV KIT
25.1000 | PACK | Freq: Once | INTRAVENOUS | Status: AC | PRN
  Administered 2013-03-03: 25.1 via INTRAVENOUS

## 2013-03-17 ENCOUNTER — Telehealth: Payer: Self-pay

## 2013-03-17 ENCOUNTER — Telehealth: Payer: Self-pay | Admitting: Vascular Surgery

## 2013-03-17 DIAGNOSIS — I8390 Asymptomatic varicose veins of unspecified lower extremity: Secondary | ICD-10-CM

## 2013-03-17 DIAGNOSIS — M79609 Pain in unspecified limb: Secondary | ICD-10-CM

## 2013-03-17 NOTE — Telephone Encounter (Signed)
Discussed pt's. Symptoms with Dr. Arbie Cookey, and was advised pt. Will need venous reflux study and office visit.

## 2013-03-17 NOTE — Telephone Encounter (Signed)
Pt. called to report "having problems with left leg".  States for past 2 months, she has had intermittent sharp pain in left lateral, lower leg.  Describes a "soreness over a vein", but denies redness/ inflammation.  States has occasional swelling in legs, but states she is on a fluid pill.  Denies any swelling in left LE at this time.   Advised will schedule appt. For eval., and to call office if symptoms worsen prior to appt.

## 2013-03-17 NOTE — Telephone Encounter (Signed)
notified patient of appt. with dr. Hart Rochester on 03-29-13 at [facility]

## 2013-03-28 ENCOUNTER — Encounter: Payer: Self-pay | Admitting: Vascular Surgery

## 2013-03-29 ENCOUNTER — Encounter (INDEPENDENT_AMBULATORY_CARE_PROVIDER_SITE_OTHER): Payer: Medicare Other | Admitting: *Deleted

## 2013-03-29 ENCOUNTER — Ambulatory Visit (INDEPENDENT_AMBULATORY_CARE_PROVIDER_SITE_OTHER): Payer: Medicare Other | Admitting: Vascular Surgery

## 2013-03-29 ENCOUNTER — Encounter: Payer: Self-pay | Admitting: Vascular Surgery

## 2013-03-29 VITALS — BP 133/86 | HR 56 | Resp 16 | Ht 65.0 in | Wt 220.0 lb

## 2013-03-29 DIAGNOSIS — M79609 Pain in unspecified limb: Secondary | ICD-10-CM

## 2013-03-29 DIAGNOSIS — I8390 Asymptomatic varicose veins of unspecified lower extremity: Secondary | ICD-10-CM

## 2013-03-29 DIAGNOSIS — I83893 Varicose veins of bilateral lower extremities with other complications: Secondary | ICD-10-CM

## 2013-03-29 NOTE — Progress Notes (Signed)
Subjective:     Patient ID: [name], female   DOB: February 28, 1941, 72 y.o.   MRN: [id number]  HPI this 72 year old female is one year status post laser ablation left great saphenous vein painful varicosities. She did well until a few months ago when she developed some discomfort in the lateral aspect of the left calf. She has had no DVT or acute thrombophlebitis and has no history of stasis ulcers bleeding or other complications. She does not wear elastic compression stockings now.  Past Medical History  Diagnosis Date  . Allergic rhinitis   . Positive PPD   . COPD (chronic obstructive pulmonary disease)   . Hypertension   . CAD (coronary artery disease)   . Venous insufficiency   . Hypercholesteremia   . Obesity   . GERD (gastroesophageal reflux disease)   . Hx of colonic polyps   . DJD (degenerative joint disease)   . Arnold-Chiari malformation   . History of absence seizures   . Reflex sympathetic dystrophy   . Varicose veins     History  Substance Use Topics  . Smoking status: Former Smoker -- 12 years    Types: Cigarettes    Quit date: 01/02/2012  . Smokeless tobacco: Never Used     Comment: 1 pack per week  . Alcohol Use: No    Family History  Problem Relation Age of Onset  . Colon cancer Neg Hx   . Stomach cancer Neg Hx     No Known Allergies  Current outpatient prescriptions:albuterol (PROVENTIL HFA;VENTOLIN HFA) 108 (90 BASE) MCG/ACT inhaler, Inhale 2 puffs into the lungs every 4 (four) hours as needed. For wheeze or shortness of breath, Disp: 1 Inhaler, Rfl: 6;  aspirin 81 MG tablet, Take 81 mg by mouth daily.  , Disp: , Rfl: ;  budesonide-formoterol (SYMBICORT) 160-4.5 MCG/ACT inhaler, Inhale 2 puffs into the lungs 2 (two) times daily., Disp: 1 Inhaler, Rfl: 5 furosemide (LASIX) 40 MG tablet, TAKE 1 TABLET (40 MG TOTAL) BY MOUTH DAILY., Disp: 30 tablet, Rfl: 5;  KLOR-CON M20 20 MEQ tablet, TAKE 1 TABLET (20 MEQ TOTAL) BY MOUTH DAILY., Disp: 30 tablet, Rfl: 5;   meloxicam (MOBIC) 15 MG tablet, Take 1 tablet (15 mg total) by mouth daily. As needed for arthritis pain, Disp: 30 tablet, Rfl: 6;  mometasone (NASONEX) 50 MCG/ACT nasal spray, Place 2 sprays into the nose daily., Disp: 17 g, Rfl: 5 Multiple Vitamin (MULTIVITAMIN) capsule, Take 1 capsule by mouth daily.  , Disp: , Rfl: ;  omeprazole (PRILOSEC) 20 MG capsule, Take 20 mg by mouth daily., Disp: , Rfl: ;  pravastatin (PRAVACHOL) 40 MG tablet, TAKE 1 TABLET (40 MG TOTAL) BY MOUTH DAILY., Disp: 30 tablet, Rfl: 2;  zolpidem (AMBIEN) 10 MG tablet, Take 1 tablet (10 mg total) by mouth daily., Disp: 30 tablet, Rfl: 4  BP 133/86  Pulse 56  Resp 16  Ht 5\' 5"  (1.651 m)  Wt 220 lb (99.791 kg)  BMI 36.61 kg/m2  Body mass index is 36.61 kg/(m^2).          Review of Systems denies chest pain, dyspnea on exertion, PND, orthopnea. This has swelling in both legs and pain in legs with walking.    Objective:   Physical Exam blood pressure 133/86 heart rate 56 respirations 16 General obese female in no apparent distress alert and oriented x3 Lungs no rhonchi or wheezing Left leg with no bulging varicosities in the thigh. She does have some bulges in the  pretibial region on the left extending laterally and anteriorly midway between the knee and lateral malleolus. One plus distal edema. No active ulcer noted.  Today I ordered venous duplex exam of the right leg which I reviewed and interpreted. There is some reflux appears to be an anterior accessory branch of the left great saphenous vein but the branch is quite small. There is no DVT.    Assessment:     Left leg discomfort with no evidence of DVT or thrombophlebitis-one year status post left great saphenous vein laser ablation    Plan:     #1 heat #2 elevation of legs #3 ibuprofen #4 short-leg elastic compression stockings-return to see [place] on when necessary basis

## 2013-04-06 ENCOUNTER — Other Ambulatory Visit: Payer: Self-pay | Admitting: Pulmonary Disease

## 2013-04-09 ENCOUNTER — Other Ambulatory Visit: Payer: Self-pay | Admitting: Pulmonary Disease

## 2013-05-04 ENCOUNTER — Telehealth: Payer: Self-pay | Admitting: Pulmonary Disease

## 2013-05-04 NOTE — Telephone Encounter (Signed)
atc [id number] NA.unable to leave message.

## 2013-05-04 NOTE — Telephone Encounter (Signed)
Spoke with pt ,made aware will get intouch with insurance and find out what meds are covered and call us back. Nothing further needed. Will close note.

## 2013-05-04 NOTE — Telephone Encounter (Signed)
Per SN: unable to determine a covered alternative without her drug formulary.  Will need a copy of this.  Thanks.  LM w/ family member TCB x1.

## 2013-05-04 NOTE — Telephone Encounter (Signed)
Pt returned triage's call.  [name] ° °

## 2013-05-04 NOTE — Telephone Encounter (Signed)
lmomtcb x1 for pt 

## 2013-05-04 NOTE — Telephone Encounter (Signed)
I spoke with pt and she stated her Remus Loffler is no longer covered by her insurance and is needing an alternative. She stated she was on hold for a very long time to see what her insruance would cover. Pt is requesting alternative. Please advise SN thanks Last OV 01/28/13 Pending 07/2013 No Known Allergies

## 2013-05-04 NOTE — Telephone Encounter (Signed)
Pt returned nurse's call & asked to be reached on her cell, [phone].  [name]

## 2013-05-09 ENCOUNTER — Telehealth: Payer: Self-pay | Admitting: Pulmonary Disease

## 2013-05-09 NOTE — Telephone Encounter (Signed)
lmomtcb x1 

## 2013-05-09 NOTE — Telephone Encounter (Signed)
I spoke with pt. In place of [place] her insurance will cover trazadone. Please advise SN thanks Last OV 01/28/13 07/28/13 pending No Known Allergies

## 2013-05-09 NOTE — Telephone Encounter (Signed)
Per SN---  Call in trazadone 50 mg  #30   1 po QHS for sleep as needed.  thanks

## 2013-05-09 NOTE — Telephone Encounter (Signed)
Patient returning call.  Patient aware that trazadone has been sent.

## 2013-05-09 NOTE — Telephone Encounter (Signed)
Pt returned call. [name]  

## 2013-05-10 ENCOUNTER — Telehealth: Payer: Self-pay | Admitting: Pulmonary Disease

## 2013-05-10 MED ORDER — TRAZODONE HCL 50 MG PO TABS: ORAL_TABLET | ORAL | Status: DC

## 2013-05-10 NOTE — Telephone Encounter (Signed)
Per SN---  Call in trazadone 50 mg #30 1 po QHS for sleep as needed. thanks   lmtcb x1--rx sent

## 2013-05-12 NOTE — Telephone Encounter (Signed)
Called spoke with patient who stated she has already picked up the trazodone Pt denies any other needs at this time

## 2013-05-16 ENCOUNTER — Other Ambulatory Visit: Payer: Self-pay | Admitting: Pulmonary Disease

## 2013-05-24 ENCOUNTER — Telehealth: Payer: Self-pay | Admitting: Pulmonary Disease

## 2013-05-24 NOTE — Telephone Encounter (Signed)
lmomtcb x1 

## 2013-05-25 NOTE — Telephone Encounter (Signed)
lmomtcb on both #s provided above 

## 2013-05-25 NOTE — Telephone Encounter (Signed)
Pt aware of recs per SN  She verbalized understanding and states nothing further needed

## 2013-05-25 NOTE — Telephone Encounter (Signed)
LMTCB on both numbers provided. Carron Curie, CMA

## 2013-05-25 NOTE — Telephone Encounter (Signed)
Spoke with pt.  She received information in the mail from a Dr. Mel Almond.  The information states pt could take omega 3 slim 1 po bid with meals for heart healthy wt loss.  She is wanting to try this but would like to know, first, if SN thinks its ok for her to take.  Advised diet and exercise are best for weight loss.  She verbalized understanding.  States she is watching the types of foods she eats, watching her portion sizes, and is exercising at the [facility] now.  She would like SN resc on the Omega as she would like to try this as a supplement in addition to diet and exercise..  SN, pls advise.  Thank you.

## 2013-05-25 NOTE — Telephone Encounter (Signed)
Per SN---   This is ok and safe to take and may really help her.  thanks

## 2013-06-05 ENCOUNTER — Other Ambulatory Visit: Payer: Self-pay | Admitting: Pulmonary Disease

## 2013-06-07 ENCOUNTER — Other Ambulatory Visit: Payer: Self-pay | Admitting: Pulmonary Disease

## 2013-07-11 ENCOUNTER — Encounter: Payer: Self-pay | Admitting: Pulmonary Disease

## 2013-07-28 ENCOUNTER — Other Ambulatory Visit (INDEPENDENT_AMBULATORY_CARE_PROVIDER_SITE_OTHER): Payer: Medicare Other

## 2013-07-28 ENCOUNTER — Encounter: Payer: Self-pay | Admitting: Pulmonary Disease

## 2013-07-28 ENCOUNTER — Encounter: Payer: Self-pay | Admitting: Gastroenterology

## 2013-07-28 ENCOUNTER — Ambulatory Visit (INDEPENDENT_AMBULATORY_CARE_PROVIDER_SITE_OTHER): Payer: Medicare Other | Admitting: Pulmonary Disease

## 2013-07-28 VITALS — BP 108/70 | HR 56 | Temp 98.4°F | Ht 65.0 in | Wt 230.4 lb

## 2013-07-28 DIAGNOSIS — K219 Gastro-esophageal reflux disease without esophagitis: Secondary | ICD-10-CM

## 2013-07-28 DIAGNOSIS — M1990 Unspecified osteoarthritis, unspecified site: Secondary | ICD-10-CM

## 2013-07-28 DIAGNOSIS — I2510 Atherosclerotic heart disease of native coronary artery without angina pectoris: Secondary | ICD-10-CM

## 2013-07-28 DIAGNOSIS — J4489 Other specified chronic obstructive pulmonary disease: Secondary | ICD-10-CM

## 2013-07-28 DIAGNOSIS — E669 Obesity, unspecified: Secondary | ICD-10-CM

## 2013-07-28 DIAGNOSIS — J449 Chronic obstructive pulmonary disease, unspecified: Secondary | ICD-10-CM

## 2013-07-28 DIAGNOSIS — I872 Venous insufficiency (chronic) (peripheral): Secondary | ICD-10-CM

## 2013-07-28 DIAGNOSIS — E7800 Pure hypercholesterolemia, unspecified: Secondary | ICD-10-CM

## 2013-07-28 DIAGNOSIS — E559 Vitamin D deficiency, unspecified: Secondary | ICD-10-CM

## 2013-07-28 DIAGNOSIS — D126 Benign neoplasm of colon, unspecified: Secondary | ICD-10-CM

## 2013-07-28 DIAGNOSIS — I10 Essential (primary) hypertension: Secondary | ICD-10-CM

## 2013-07-28 DIAGNOSIS — F419 Anxiety disorder, unspecified: Secondary | ICD-10-CM

## 2013-07-28 DIAGNOSIS — M545 Low back pain, unspecified: Secondary | ICD-10-CM

## 2013-07-28 DIAGNOSIS — F411 Generalized anxiety disorder: Secondary | ICD-10-CM

## 2013-07-28 LAB — CBC WITH DIFFERENTIAL/PLATELET
Basophils Absolute: 0 10*3/uL (ref 0.0–0.1)
Basophils Relative: 0.4 % (ref 0.0–3.0)
Eosinophils Absolute: 0.1 10*3/uL (ref 0.0–0.7)
Eosinophils Relative: 1.9 % (ref 0.0–5.0)
HCT: 34.3 % — ABNORMAL LOW (ref 36.0–46.0)
Hemoglobin: 11.3 g/dL — ABNORMAL LOW (ref 12.0–15.0)
Lymphocytes Relative: 45.1 % (ref 12.0–46.0)
Lymphs Abs: 1.6 10*3/uL (ref 0.7–4.0)
MCHC: 33.1 g/dL (ref 30.0–36.0)
MCV: 80.6 fl (ref 78.0–100.0)
Monocytes Absolute: 0.3 10*3/uL (ref 0.1–1.0)
Monocytes Relative: 8.7 % (ref 3.0–12.0)
Neutro Abs: 1.6 10*3/uL (ref 1.4–7.7)
Neutrophils Relative %: 43.9 % (ref 43.0–77.0)
Platelets: 189 10*3/uL (ref 150.0–400.0)
RBC: 4.25 Mil/uL (ref 3.87–5.11)
RDW: 14.1 % (ref 11.5–14.6)
WBC: 3.6 10*3/uL — ABNORMAL LOW (ref 4.5–10.5)

## 2013-07-28 MED ORDER — PRAVASTATIN SODIUM 40 MG PO TABS: ORAL_TABLET | ORAL | Status: DC

## 2013-07-28 MED ORDER — MELOXICAM 15 MG PO TABS: 15.0000 mg | ORAL_TABLET | Freq: Every day | ORAL | Status: DC

## 2013-07-28 MED ORDER — TRAZODONE HCL 100 MG PO TABS: 100.0000 mg | ORAL_TABLET | Freq: Every day | ORAL | Status: DC

## 2013-07-28 NOTE — Patient Instructions (Addendum)
Today we updated your med list in our EPIC system...    Continue your current medications the same...  Today we did your follow up FASTING blood work...    We will contact you w/ the results when available...   Let's get on track w/ our diet 7 exercise program... The goal is to lose 10-15 lbs!  Call for any questions...  Let's plan a follow up visit in 47mo, sooner if needed for problems.Marland KitchenMarland Kitchen

## 2013-07-28 NOTE — Progress Notes (Signed)
Subjective:    Patient ID: [name], female    DOB: [birth year], 72 y.o.   MRN: [id number]  HPI 72 y/o BF here for a follow up of her mult med problems including: Allergies, HBP, CAD, Chol, Obesity, etc... she is also followed by DrCrenshaw for Cards, & DrESL for allergies...  ~  January 23, 2012:  10mo ROV & add-on for refractory URI/AB> started weeks ago w/ nasal congestion, drainage, sore throat, hoarseness, cough w/ yellow sput, wheezing, SOB, "tight" etc;  Went to ER 01/12/12 denied f/c/s, no CP, noted +smoker w/ COPD/asthma; CXR showed mild cardiomeg & ectatic Ao, pulm vasc congestion, DJD sp, NAD;  CTA was neg for PE, some bronchial thickening, +atherosclerotic changes in Ao & coronaries;  Labs showed wnl x Hg=11.3 & WBC=5.3;  she was treated w/ Doxy Bid & Pred taper, asked to restart her Symbicort;  Had f/u w/ TP & asked to continue Rx, quit smoking, take the Symbicort 2sp Bid & restart Mucinex...  Now c/o persistent cough, beige sput, & hoarseness> we decided to Rx w/ Levaquin, Medrol Dosepak, MMW;  She wants refills Vicodin & Ambien...    >She saw DrCrenshaw 1/13 for f/u CAD> last Myoview & 2DEcho were inn 2010; she's been stable on ASA81mg /d, Lasix, KCl, Prav40; she denies angina & DrCrenshaw rec quit smoking, same meds, lose weight...    >She saw [facility] 2/13 for f/u severe ven insuffic left leg w/ pain & chr swelling, bulging varicosities; using compression hose, no salt, elevation, & Ibuprofen w/o improvement;  He plans laser ablation of the GSV & then he'll consider stab phlebectomy of secondary varicosities later...  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    She reports f/u [facility] 4/13 for laser ablation of the left GSV==> f/u venous duplex confirmed total closure of the left GSV, no DVT, etc; she is feeling much better but she cannot wear the compression stocking she says...  ~  Apr 23, 2012:  34mo ROV & add-on for persistent back & left leg pain> [name] note from 03/26/12 visit- she was c/o recurrent pain in the right side of her back x1wk & we suggested Mobic/ Robaxin/ Heat & f/u w/ Ortho;  She states min improvement w/ meds & ?she saw ortho ?who and no change in rec for rest heat & anti-inflamm rx;  No better so she ret to [name] us> known DJD, DrBeane has done bilat TKRs, we will refer to [facility] for further eval of her back & left leg pain... For now she has Vicodin to take up to Tid as needed + Mobic & left over muscle relaxer... Review of EPIC Imaging section reveals XRays of knees w/ bilat TKRs by drBeane but no prev imaging of back etc...  ~  July 29, 2012:  55mo ROV & [name] notes that her prev back discomfort is better w/ prn Mobic & Vicodin (requests refills today)... Now c/o cramps in her legs &  wonders if she needs to incr her potassium (we will check level=3.8);  She has Lasix40 but choses to take it intermittently noting that she doesn't pee as much if she takes one daily vis-a-vis 2 Qod;  Still on the K20 taking one daily;  Weight is down 14# to 234# today...    She had f/u Ven[facility]pplers by [facility] 5/13> no evid DVT, left GSV ablated, other VV noted; no reflux seen & the fitted her for knee high compression hose but they are "too hard to wear every day"...    We reviewed prob list, meds, xrays and labs> [name] below for updates >> LABS 8/13:  FLP- at goals on Prav40;  Chems- all wnl;  CBC- ok w/ Hg=11.8 MCV=81 Fe=49 (12%sat);  TSH=1.38... rec to start FeSo4 daily.   ~  January 28, 2013:  31mo ROV & her CC is leg  discomfort for which she has been eval by Tora Perches- we do not have his notes but she reports XRays OK & she reports that steroid pack x8d really helped... We reviewed the following medical problems during today's office visit >>     COPD> smoker, hx pos PPD, AR> on Symbicort160, Proair prn, Nasonex prn; she says she quit smoking 244mo ago & she is encouraged to remain quit! She denies cough, sput, hemoptysis, ch in dyspnea, etc...    HBP> on Lasix40, K20; BP= 122/80 & she denies CP, palpit, dizzy, SOB, worsening edema...    CAD> on ASA81; followed by drCrenshaw & seen 1/14- stable & no changes made...    VI> she knows to elim sodium, elev legs, wear support hose, & take the Lasix40...    Chol> on Prav40; last FLP 8/13 looked good w/ TChol 141, TG 92, HDL 63, LDL 60    Overweight> wt= 213# which is down 2#, but her BMI is 39 & she has a long way to go...    GI- GERD, Polyps, known gallstones> on Omep20; she denies abd pain, dysphagia, n/v, c/d, blood seen...    DJD, s/p left TKR, LBP> on Mobic15, Aleve prn, Vicodin prn; managed by Ortho- DrBeane...    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    Arnold Chiari malformation & hx seizures> she had a suboccipital craniectomy and C1 laminectomy for Arnold-Chiari malformation by DrKritzer in 1993.     Insomnia> on Ambien10 prn... We reviewed prob list, meds, xrays and labs> [name] below for updates >>   ~  July 28, 2013:  31mo ROV & [name] is c/o "old age" and "aches & pains"...  We reviewed the following medical problems during today's office visit >>     COPD> ex-smoker, hx pos PPD, AR> on Symbicort160, Proair prn, Nasonex prn; she says she quit smoking end of 2013- denies cough, sput, hemoptysis, ch in dyspnea, etc...    HBP> on Lasix40, K20; BP= 108/70 & she denies CP, palpit, dizzy, SOB, worsening edema...    CAD> on ASA81; followed by DrCrenshaw & seen 1/14- stable & no changes made...    VI> she had laser ablation of left greater  saphenous vein 2013 (painful varicosities) by Windell Moulding; f/u Venous Dopplers 4/14 showed no evid of DVT...    Chol> on Prav40; FLP 8/14 looked good w/ TChol 133, TG 101, HDL 54, LDL 59    Overweight> wt= 086# which is down 2#, but her BMI is 39 & she has a long way to go...    GI- GERD, Polyps, known gallstones>  on Omep20; she denies abd pain, dysphagia, n/v, c/d, blood seen...    DJD, s/p bilat TKRs, LBP> on Mobic15, Aleve prn, Vicodin prn; managed by Ortho- DrBeane...    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    Arnold Chiari malformation & hx seizures> she had a suboccipital craniectomy and C1 laminectomy for Arnold-Chiari malformation by DrKritzer in 1993.     Insomnia> prev on Ambien10 prn, now on Desyrel50 => incr to 100mg  qhs...  We reviewed prob list, meds, xrays and labs> [name] below for updates >>  LABS 8/14:  FLP- at goals on Prav40;  Chems- wnl;  CBC- Hg=11.3, Fe=47 (14%sat);  TSH= 0.96;  VitD= 48...           Problem List:  ALLERGIC RHINITIS (ICD-477.9) > on NASONEX, ASTEPRO, ZYRTEK, & allergy shots- followed by DrVanWinkle... POSITIVE PPD (ICD-795.5) CIGARETTE SMOKER CHRONIC OBSTRUCTIVE ASTHMA UNSPECIFIED (ICD-493.20) - she had +allergy skin testing for dust mites and started on allergy shots per DrESL along w/ SYMBICORT 160- 2spBid, but she doesn't take anything regularly... +smoker but decreased from 1/2 ppd x 4yrs to 1-2 cig/d. ~  2/11: she reports +PPD on pre-employment testing> sent to health dept, CXR- neg, & started on INH 300mg /d x18mo (they never sent records for [place] to review). ~  CXR 2/11 showed borderline heart size, tort Ao, sl incr markings, NAD... ~  subseq CXRs done at health dept for her pos PPD but we don't have the films or reports... ~  2/13: Refractory AB treated via ER w/ Doxy, Pred, restart Symbicort, +Mucinex, etc; we decided upon Levaquin, Medrol, MMW... ~  CXR 2/13 showed mild cardiomeg, ectatic Ao & coronary calcif, DJD sp, NAD; CTAngio  was neg for PE, +bronchial wall thickening, NAD.Marland Kitchen. ~  8/13: she tells me "I saw my asthma doctor last week- doing OK" no changes in meds from DrVanWinkle... ~  2/14: on Symbicort160, Proair prn, Nasonex prn; she says she quit smoking 36mo ago & she is encouraged to remain quit! She denies cough, sput, hemoptysis, ch in dyspnea, etc. ~  8/14: Hx COPD> ex-smoker, hx pos PPD, AR> on Symbicort160, Proair prn, Nasonex prn; she says she quit smoking end of 2013- denies cough, sput, hemoptysis, ch in dyspnea, etc.  Hx of HYPERTENSION (ICD-401.9) - off Lisinopril/Hct (Hx ACE reaction) & taking LASIX 40mg  as needed for swelling... ~  EKG 6/12 showed SBrady w/ rate= 55, otherw WNL, NAD.Marland Kitchen. ~  5/13:  BP=120/78 today and denies HA, fatigue, visual changes, CP, palipit, dizziness, syncope, dyspnea, edema, etc... ~  8/13:  BP= 124/82 & she remains largely asymtomatic... ~  2/14: on Lasix40, K20; BP= 122/80 & she denies CP, palpit, dizzy, SOB, worsening edema.  ~  8/14: on Lasix40, K20; BP= 108/70 & she remains asymptomatic...  CAD (ICD-414.00) - known non-obstructive disease & atypic CP... on ASA 325mg /d, STATIN (Prav40), & off ACE. ~  cath 2/05 showed 20-60% obstructions in all 3 vessels... good LVF... ~  NuclearStressTest 3/09 was negative- no ischemia or infarction, EF=68%... ~  Eval for recurrent CP- EKG showed NSR, NAD;  2DEcho showed mild focal basal septal hypertrophy, norm wall motion w/ EF= 55-65%, mild DD & PAsys= 37;  Myoview NEG- no scar, no ischemia, EF=64%... ~  ROV w/ DrCrenshaw 7/11 reviewed- continue ASA/ Statin/ smoking cessation/ monitor BP & Chol. ~  She saw DrCrenshaw 1/13 for f/u CAD> last Myoview & 2DEcho were in 2010; she's been stable on ASA81mg /d, Lasix, KCl, Prav40;  she denies angina & DrCrenshaw rec quit smoking, same meds, lose weight... Note> CTA 2/13 showed atherosclerotic changes seen in Ao & coronaries, mild cardiomegaly ~  She had yearly f/u DrCrenshaw 1/14> felt to be stable, no  changes made;  EKG showed SBrady, rate 49, NSSTTWA...  VENOUS INSUFFICIENCY (ICD-459.81) - she follows a low salt diet... +LASIX 40mg /d for swelling... ~  1/10: we discussed no salt, Lasix20, elevation, support hose. ~  She was evaluated by [facility] Mould[facility] w/ severe ven insuffic left leg w/ pain & chr swelling, bulging varicosities; using compression hose, no salt, elevation, & Ibuprofen w/o improvement;  He plans laser ablation of the GSV & then he'll consider stab phlebectomy of secondary varicosities later... ~  4/13: s/p left GSV laser ablation; she is feeling better w/ decr pain etc after the procedure... ~  5/13:  F/u VenDopplers by [facility] w/ good ablation, no evid DVT etc... ~  4/14:  She had f/u DrLawson> stable, no DVT, rec compression hose...  HYPERCHOLESTEROLEMIA (ICD-272.0) - on PRAVASTATIN 40mg /d now + diet efforts. ~  FLP 5/08 on Cres5 showed TChol 115, TG 83, HDL 50, LDL 49... tolerating well and taking med regularly. ~  FLP 3/09 on Cres5 showed TChol 126, TG 104, HDL 56, LDL 49... rec- same. ~  FLP 3/10 on Cres5 showed TChol 152, TG 97, HDL 77, LDL 55 ~  FLP 12/10 on Cres5 showed TChol 127, TG 100, HDL 52, LDL 55 ~  7/11:  pt requests change to less expensive statin> try PRAVASTATIN40 ~  FLP 10/11 on Prav40 showed TChol 143, TG 125, HDL 51, LDL 67... Continue same. ~  FLP 6/12 on Prav40 showed TChol 143, TG 145, HDL 61, LDL 53 ~  FLP 8/13 on Prav40 showed TChol 141, TG 92, HDL 63, LDL 60 ~  FLP 8/14 on Prav40 showed TChol 133, TG 101, HDL 54, LDL 59  OBESITY (ICD-278.00) - unable to diet effectively and get weight down...  ~  weight 5/10 = 238#, 5\' 5"  tall,  BMI= 40... we discussed diet and exercise program... again! ~  weight 1/11 = 228# ~  weight 7/11 = 232# ~  weight 10/11 = 230# ~  Weight 6/12 = 232# ~  Weight 10/12 = 234# ~  Weight 2/13 = 241# ~  Weight 4/13 = 248#... She is asked to get wt down!!! ~  Weight 8/13 = 234#... Great job! ~  [employer] 2/14 = 232# ~  Weight 8/14  = 230#  GERD (ICD-530.81) - EGD 7/05 by DrStark w/ HH- supposed to be on PPI (Omeprazole 20mg ) & H2 blocker (Pepcid 20mg ) at bedtime... ~  2/14: she takes the Omep20 daily but using the Zantac as needed...  COLONIC POLYPS (ICD-211.3) - last colonoscopy was 12/03 showing several 3-12mm polyps (hyperplastic)...  HX OF GALLSTONE (ICD-V12.79) - seen on CT 7/06 and referred to [facility]- eval by DrCornett and being followed...  DEGENERATIVE JOINT DISEASE (ICD-715.90) - followed by DrBeane for ortho... Right knee arthroscopy 1/09 without help... s/p right TKR 06/12/08 by Tora Perches... 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 she is off the Etodolac & takes PERCOCET Prn... ~  1/11: now complaining of left knee pain & will f/u w/ DrBeane... ~  6/12:  DrBeane plans left TKR soon... OK for surg... ~  7/12: s/p left TKR per DrBeane & went to [facility] for rehab... ~  8/13:  She requests refill MOBIC 15mg  as needed...  BACK PAIN, LUMBAR (ICD-724.2) >> presented 4/13  w/ pain in left side of back & radiating down leg leg... Given Vicodin, anti-inflamm med & muscle relaxer;  Referred to Ortho for further eval==> ?if she ever saw Ortho, pain resolved w/ Mobic, Norco, rest, heat, etc...  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   Past Surgical History  Procedure Laterality Date  . Abdominal hysterectomy    . Cspine surgery  1993    for arnold-chiari malformation  . Right knee arthroscopy  12/2007    Dr. Shelle Iron  . 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 Shelle Iron  . Joint replacement  06/05/11    Left total knee replacement    Outpatient Encounter Prescriptions as of 07/28/2013  Medication Sig Dispense Refill  . albuterol (PROVENTIL HFA;VENTOLIN  HFA) 108 (90 BASE) MCG/ACT inhaler Inhale 2 puffs into the lungs every 4 (four) hours as needed. For wheeze or shortness of breath  1 Inhaler  6  . aspirin 81 MG tablet Take 81 mg by mouth daily.        . budesonide-formoterol (SYMBICORT) 160-4.5 MCG/ACT inhaler Inhale 2 puffs into the lungs 2 (two) times daily.  1 Inhaler  5  . furosemide (LASIX) 40 MG tablet TAKE 1 TABLET (40 MG TOTAL) BY MOUTH DAILY.  30 tablet  6  . KLOR-CON M20 20 MEQ tablet TAKE 1 TABLET (20 MEQ TOTAL) BY MOUTH DAILY.  30 tablet  3  . meloxicam (MOBIC) 15 MG tablet Take 1 tablet (15 mg total) by mouth daily. As needed for arthritis pain  30 tablet  6  . mometasone (NASONEX) 50 MCG/ACT nasal spray Place 2 sprays into the nose daily.  17 g  5  . Multiple Vitamin (MULTIVITAMIN) capsule Take 1 capsule by mouth daily.        Marland Kitchen omeprazole (PRILOSEC) 20 MG capsule Take 20 mg by mouth daily.      . pravastatin (PRAVACHOL) 40 MG tablet TAKE 1 TABLET (40 MG TOTAL) BY MOUTH DAILY.  30 tablet  6  . traZODone (DESYREL) 50 MG tablet Take 1 tablet daily at bedtime as needed for sleep  30 tablet  5  . zolpidem (AMBIEN) 10 MG tablet Take 1 tablet (10 mg total) by mouth daily.  30 tablet  4  . [DISCONTINUED] furosemide (LASIX) 40 MG tablet TAKE 1 TABLET (40 MG TOTAL) BY MOUTH DAILY.  30 tablet  3   No facility-administered encounter medications on file as of 07/28/2013.    No Known Allergies   Current Medications, Allergies, Past Medical History, Past Surgical History, Family History, and Social History were reviewed in [employer] record.    Review of Systems         [name] HPI - all other systems neg except as noted... The patient complains of dyspnea on exertion & gait abn.  The patient denies anorexia, fever, weight loss, weight gain, vision loss, decreased hearing, hoarseness, chest pain, syncope, worsening edema, prolonged cough, headaches, hemoptysis, abdominal pain, melena, hematochezia, severe  indigestion/heartburn, hematuria, incontinence, muscle weakness, suspicious skin lesions, transient blindness, depression, unusual weight change, abnormal bleeding, enlarged lymph nodes, and angioedema.     Objective:   Physical Exam      WD, Overweight, 72 y/o BF in NAD... GENERAL:  Alert & oriented; pleasant & cooperative.Marland KitchenMarland Kitchen  HEENT:  [place]/AT, EOM-wnl, PERRLA, EACs-clear, TMs-wnl, NOSE-clear, THROAT-clear & wnl, no lesions seen; VOICE- sl hoarse. NECK:  Supple w/ decrROM; no JVD; normal carotid impulses w/o bruits; no thyromegaly or nodules palpated; no lymphadenopathy. CHEST:  Few scat rhonchi, mild congestion & exp wheezing, no consolidation... HEART:  Regular Rhythm, gr 1/6 SEM without rubs or gallops... ABDOMEN:  Obese, soft & nontender; normal bowel sounds; no organomegaly or masses detected. EXT:  s/p right TKR, mod arthritic changes; +varicose veins (s/p laser ablation left GSV 4/13)/ +venous insuffic/ 1+ edema... NEURO:  CN's intact; no focal neuro deficits... DERM:  No lesions noted; no rash etc...  RADIOLOGY DATA:  Reviewed in the EPIC EMR & discussed w/ the patient...  LABORATORY DATA:  Reviewed in the EPIC EMR & discussed w/ the patient...   Assessment & Plan:    Hx Asthma/ COPD/ AB, smoker (?ex), & hx pos PPD- treated> She's had refractory "attack" & treated w/ Levaquin 500mg /d, Medrol dosepak, Mucinex 2Bid, MMW, etc... Must quit all smoking & stay quit!  If hoarseness persists then refer to ENT...  HBP>  BP controlled on diet + Lasix Rx...  CAD>  Denies CP, angina, etc;  On ASA, statin, off cigs, etc...  Ven Insuffic>  On sodium restriction, elevation, support hose; also on LASIX to 40mg /d & followed by [facility] & s/p laser ablation of left GSV- improved.  CHOL>  Back on Prav40 & FLP is stable...  OBESITY>  Needs better diet & hopefully incr exerc after the knee surg...  GI>  GERD, Polyps, Gallstone> follwed by DrStark & stable on Omep20 but uses prn  only...  S/P left TKR 7/12 by DrBeane>  Course complic by bleed into left calf; now stable & improving w/ PT etc...  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 She will need Ortho referral for further eval...  Other medical issues as noted...   Patient's Medications  New Prescriptions   TRAZODONE (DESYREL) 100 MG TABLET    Take 1 tablet (100 mg total) by mouth at bedtime.  Previous Medications   ALBUTEROL (PROVENTIL HFA;VENTOLIN HFA) 108 (90 BASE) MCG/ACT INHALER    Inhale 2 puffs into the lungs every 4 (four) hours as needed. For wheeze or shortness of breath   ASPIRIN 81 MG TABLET    Take 81 mg by mouth daily.     BUDESONIDE-FORMOTEROL (SYMBICORT) 160-4.5 MCG/ACT INHALER    Inhale 2 puffs into the lungs 2 (two) times daily.   FUROSEMIDE (LASIX) 40 MG TABLET    TAKE 1 TABLET (40 MG TOTAL) BY MOUTH DAILY.   KLOR-CON M20 20 MEQ TABLET    TAKE 1 TABLET (20 MEQ TOTAL) BY MOUTH DAILY.   MOMETASONE (NASONEX) 50 MCG/ACT NASAL SPRAY    Place 2 sprays into the nose daily.   MULTIPLE VITAMIN (MULTIVITAMIN) CAPSULE    Take 1 capsule by mouth daily.     OMEPRAZOLE (PRILOSEC) 20 MG CAPSULE    Take 20 mg by mouth daily.  Modified Medications   Modified Medication Previous Medication   MELOXICAM (MOBIC) 15 MG TABLET meloxicam (MOBIC) 15 MG tablet      Take 1 tablet (15 mg total) by mouth daily. As needed for arthritis pain    Take 1 tablet (15 mg total) by mouth daily. As needed for arthritis pain   PRAVASTATIN (PRAVACHOL) 40 MG TABLET pravastatin (PRAVACHOL) 40  MG tablet      TAKE 1 TABLET (40 MG TOTAL) BY MOUTH DAILY.    TAKE 1 TABLET (40 MG TOTAL) BY MOUTH DAILY.  Discontinued Medications   FUROSEMIDE (LASIX) 40 MG TABLET    TAKE 1 TABLET (40 MG TOTAL) BY MOUTH DAILY.   TRAZODONE (DESYREL) 50 MG TABLET    Take 1 tablet daily at bedtime as needed for sleep   ZOLPIDEM (AMBIEN) 10 MG  TABLET    Take 1 tablet (10 mg total) by mouth daily.

## 2013-07-29 ENCOUNTER — Telehealth: Payer: Self-pay | Admitting: Pulmonary Disease

## 2013-07-29 LAB — HEPATIC FUNCTION PANEL
ALT: 17 U/L (ref 0–35)
AST: 21 U/L (ref 0–37)
Albumin: 3.6 g/dL (ref 3.5–5.2)
Alkaline Phosphatase: 83 U/L (ref 39–117)
Bilirubin, Direct: 0.1 mg/dL (ref 0.0–0.3)
Total Bilirubin: 0.5 mg/dL (ref 0.3–1.2)
Total Protein: 6.9 g/dL (ref 6.0–8.3)

## 2013-07-29 LAB — IBC PANEL
Iron: 47 ug/dL (ref 42–145)
Saturation Ratios: 13.8 % — ABNORMAL LOW (ref 20.0–50.0)
Transferrin: 243.6 mg/dL (ref 212.0–360.0)

## 2013-07-29 LAB — BASIC METABOLIC PANEL
BUN: 10 mg/dL (ref 6–23)
CO2: 28 mEq/L (ref 19–32)
Calcium: 9.3 mg/dL (ref 8.4–10.5)
Chloride: 105 mEq/L (ref 96–112)
Creatinine, Ser: 0.6 mg/dL (ref 0.4–1.2)
GFR: 117.8 mL/min (ref >60.00)
Glucose, Bld: 87 mg/dL (ref 70–99)
Potassium: 4.2 mEq/L (ref 3.5–5.1)
Sodium: 140 mEq/L (ref 135–145)

## 2013-07-29 LAB — TSH: TSH: 0.96 u[IU]/mL (ref 0.35–5.50)

## 2013-07-29 LAB — LIPID PANEL
Cholesterol: 133 mg/dL (ref 0–200)
HDL: 53.6 mg/dL (ref >39.00)
LDL Cholesterol: 59 mg/dL (ref 0–99)
Total CHOL/HDL Ratio: 2
Triglycerides: 101 mg/dL (ref 0.0–149.0)
VLDL: 20.2 mg/dL (ref 0.0–40.0)

## 2013-07-29 LAB — VITAMIN D 25 HYDROXY (VIT D DEFICIENCY, FRACTURES): Vit D, 25-Hydroxy: 48 ng/mL (ref 30–89)

## 2013-07-29 NOTE — Telephone Encounter (Signed)
i called [facility] and they stated that the pt did drop this rx off and this rx is ready to be picked up.  Nothing further is needed.

## 2013-07-29 NOTE — Telephone Encounter (Signed)
I spoke with pt. She stated she was not giving RX for her trazodone 100 mg. EPIC shows this was printed.   I called [facility] and was advised she did drop this off this AM.   I called pt back and she thought it was RX for hydrocodone. I advised this is not on her current med list. Pt stated she only has 2 left. I looked in EPIC and show RX last refilled 07/29/12 #90 x 5 refills. Please advise SN thanks

## 2013-08-02 ENCOUNTER — Encounter: Payer: Self-pay | Admitting: Gastroenterology

## 2013-09-08 ENCOUNTER — Other Ambulatory Visit: Payer: Self-pay | Admitting: Pulmonary Disease

## 2013-10-04 ENCOUNTER — Ambulatory Visit (AMBULATORY_SURGERY_CENTER): Payer: Medicare Other | Admitting: *Deleted

## 2013-10-04 VITALS — Ht 65.0 in | Wt 234.6 lb

## 2013-10-04 DIAGNOSIS — Z86010 Personal history of colonic polyps: Secondary | ICD-10-CM

## 2013-10-04 MED ORDER — MOVIPREP 100 G PO SOLR: ORAL | Status: DC

## 2013-10-04 NOTE — Progress Notes (Signed)
No allergies to eggs or soy. No problems with anesthesia.  No oxygen use 

## 2013-10-05 ENCOUNTER — Telehealth: Payer: Self-pay | Admitting: Gastroenterology

## 2013-10-05 NOTE — Telephone Encounter (Signed)
Only PREPOPIK sample available.  Called pt with new instructions.  She will pick up prep and new printed instructions at 4th floor desk today before 1:30pm.

## 2013-10-06 ENCOUNTER — Encounter: Payer: Self-pay | Admitting: Gastroenterology

## 2013-10-17 ENCOUNTER — Encounter: Payer: Self-pay | Admitting: Gastroenterology

## 2013-10-17 ENCOUNTER — Ambulatory Visit (AMBULATORY_SURGERY_CENTER): Payer: Medicare Other | Admitting: Gastroenterology

## 2013-10-17 VITALS — BP 130/68 | HR 47 | Temp 96.5°F | Resp 14 | Ht 65.0 in | Wt 234.0 lb

## 2013-10-17 DIAGNOSIS — Z86010 Personal history of colonic polyps: Secondary | ICD-10-CM

## 2013-10-17 DIAGNOSIS — Z1211 Encounter for screening for malignant neoplasm of colon: Secondary | ICD-10-CM

## 2013-10-17 DIAGNOSIS — D126 Benign neoplasm of colon, unspecified: Secondary | ICD-10-CM

## 2013-10-17 MED ORDER — SODIUM CHLORIDE 0.9 % IV SOLN: 500.0000 mL | INTRAVENOUS | Status: DC

## 2013-10-17 NOTE — Patient Instructions (Signed)
Discharge instructions given with verbal understanding. Handout on polyps. Resume previous medications. YOU HAD AN ENDOSCOPIC PROCEDURE TODAY AT THE [facility]: Refer to the procedure report that was given to you for any specific questions about what was found during the examination.  If the procedure report does not answer your questions, please call your gastroenterologist to clarify.  If you requested that your care partner not be given the details of your procedure findings, then the procedure report has been included in a sealed envelope for you to review at your convenience later.  YOU SHOULD EXPECT: Some feelings of bloating in the abdomen. Passage of more gas than usual.  Walking can help get rid of the air that was put into your GI tract during the procedure and reduce the bloating. If you had a lower endoscopy (such as a colonoscopy or flexible sigmoidoscopy) you may notice spotting of blood in your stool or on the toilet paper. If you underwent a bowel prep for your procedure, then you may not have a normal bowel movement for a few days.  DIET: Your first meal following the procedure should be a light meal and then it is ok to progress to your normal diet.  A half-sandwich or bowl of soup is an example of a good first meal.  Heavy or fried foods are harder to digest and may make you feel nauseous or bloated.  Likewise meals heavy in dairy and vegetables can cause extra gas to form and this can also increase the bloating.  Drink plenty of fluids but you should avoid alcoholic beverages for 24 hours.  ACTIVITY: Your care partner should take you home directly after the procedure.  You should plan to take it easy, moving slowly for the rest of the day.  You can resume normal activity the day after the procedure however you should NOT DRIVE or use heavy machinery for 24 hours (because of the sedation medicines used during the test).    SYMPTOMS TO REPORT IMMEDIATELY: A  gastroenterologist can be reached at any hour.  During normal business hours, 8:30 AM to 5:00 PM Monday through Friday, call [phone].  After hours and on weekends, please call the GI answering service at [phone] who will take a message and have the physician on call contact you.   Following lower endoscopy (colonoscopy or flexible sigmoidoscopy):  Excessive amounts of blood in the stool  Significant tenderness or worsening of abdominal pains  Swelling of the abdomen that is new, acute  Fever of 100F or higher  FOLLOW UP: If any biopsies were taken you will be contacted by phone or by letter within the next 1-3 weeks.  Call your gastroenterologist if you have not heard about the biopsies in 3 weeks.  Our staff will call the home number listed on your records the next business day following your procedure to check on you and address any questions or concerns that you may have at that time regarding the information given to you following your procedure. This is a courtesy call and so if there is no answer at the home number and we have not heard from you through the emergency physician on call, we will assume that you have returned to your regular daily activities without incident.  SIGNATURES/CONFIDENTIALITY: You and/or your care partner have signed paperwork which will be entered into your electronic medical record.  These signatures attest to the fact that that the information above on your After Visit Summary has been   reviewed and is understood.  Full responsibility of the confidentiality of this discharge information lies with you and/or your care-partner. 

## 2013-10-17 NOTE — Progress Notes (Signed)
Patient did not experience any of the following events: a burn prior to discharge; a fall within the facility; wrong site/side/patient/procedure/implant event; or a hospital transfer or hospital admission upon discharge from the facility. (G8907) Patient did not have preoperative order for IV antibiotic SSI prophylaxis. (G8918)  

## 2013-10-17 NOTE — Op Note (Signed)
[facility] [address] [place], [address]   COLONOSCOPY PROCEDURE REPORT  PATIENT: [name], [name]  MR#: [id number] BIRTHDATE: October 24, 1941 , 72  yrs. old GENDER: Female ENDOSCOPIST: Meryl Dare, MD, [facility] PROCEDURE DATE:  10/17/2013 PROCEDURE:   Colonoscopy with biopsy and snare polypectomy First Screening Colonoscopy - Avg.  risk and is 50 yrs.  old or older - No.  Prior Negative Screening - Now for repeat screening. 10 or more years since last screening  History of Adenoma - Now for follow-up colonoscopy & has been > or = to 3 yrs.  N/A  Polyps Removed Today? Yes. ASA CLASS:   Class II INDICATIONS:average risk screening. MEDICATIONS: MAC sedation, administered by CRNA and propofol (Diprivan) 300mg  IV DESCRIPTION OF PROCEDURE:   After the risks benefits and alternatives of the procedure were thoroughly explained, informed consent was obtained.  A digital rectal exam revealed no abnormalities of the rectum.   The LB [id number]  endoscope was introduced through the anus and advanced to the cecum, which was identified by both the appendix and ileocecal valve. No adverse events experienced.   The quality of the prep was good, using MoviPrep  The instrument was then slowly withdrawn as the colon was fully examined.  COLON FINDINGS: A sessile polyp measuring 5 mm in size was found in the descending colon.  A polypectomy was performed with a cold snare.  The resection was complete and the polyp tissue was completely retrieved.   A sessile polyp measuring 4 mm in size was found in the sigmoid colon.  A polypectomy was performed with cold forceps.  The resection was complete and the polyp tissue was completely retrieved.   The colon was otherwise normal.  There was no diverticulosis, inflammation, polyps or cancers unless previously stated.  Retroflexed views revealed no abnormalities. The time to cecum=3 minutes 26 seconds.  Withdrawal time=11 minutes 44  seconds.  The scope was withdrawn and the procedure completed. COMPLICATIONS: There were no complications.  ENDOSCOPIC IMPRESSION: 1.   Sessile polyp measuring 5 mm in the descending colon; polypectomy performed with a cold snare 2.   Sessile polyp measuring 4 mm in the sigmoid colon; polypectomy performed with cold forceps 3.   The colon was otherwise normal  RECOMMENDATIONS: 1.  Await pathology results 2.  Repeat colonoscopy in 5 years if polyp(s) adenomatous; otherwise 10 years  eSigned:  Meryl Dare, MD, [facility] 10/17/2013 10:36 AM

## 2013-10-17 NOTE — Progress Notes (Signed)
Called to room to assist during endoscopic procedure.  Patient ID and intended procedure confirmed with present staff. Received instructions for my participation in the procedure from the performing physician.  

## 2013-10-18 ENCOUNTER — Telehealth: Payer: Self-pay | Admitting: *Deleted

## 2013-10-18 NOTE — Telephone Encounter (Signed)
No answer, left message to call office if questions or concerns. 

## 2013-10-23 ENCOUNTER — Encounter: Payer: Self-pay | Admitting: Gastroenterology

## 2013-11-11 ENCOUNTER — Encounter (HOSPITAL_COMMUNITY): Payer: Self-pay | Admitting: Emergency Medicine

## 2013-11-11 ENCOUNTER — Emergency Department (HOSPITAL_COMMUNITY)
Admission: EM | Admit: 2013-11-11 | Discharge: 2013-11-11 | Disposition: A | Discharge status: Home / Self Care | Payer: No Typology Code available for payment source | Attending: Emergency Medicine | Admitting: Emergency Medicine

## 2013-11-11 DIAGNOSIS — Y92410 Unspecified street and highway as the place of occurrence of the external cause: Secondary | ICD-10-CM | POA: Insufficient documentation

## 2013-11-11 DIAGNOSIS — K219 Gastro-esophageal reflux disease without esophagitis: Secondary | ICD-10-CM | POA: Insufficient documentation

## 2013-11-11 DIAGNOSIS — E669 Obesity, unspecified: Secondary | ICD-10-CM | POA: Insufficient documentation

## 2013-11-11 DIAGNOSIS — M1990 Unspecified osteoarthritis, unspecified site: Secondary | ICD-10-CM | POA: Insufficient documentation

## 2013-11-11 DIAGNOSIS — Z8669 Personal history of other diseases of the nervous system and sense organs: Secondary | ICD-10-CM | POA: Insufficient documentation

## 2013-11-11 DIAGNOSIS — Z79899 Other long term (current) drug therapy: Secondary | ICD-10-CM | POA: Insufficient documentation

## 2013-11-11 DIAGNOSIS — Z860100 Personal history of colon polyps, unspecified: Secondary | ICD-10-CM | POA: Insufficient documentation

## 2013-11-11 DIAGNOSIS — IMO0002 Reserved for concepts with insufficient information to code with codable children: Secondary | ICD-10-CM | POA: Insufficient documentation

## 2013-11-11 DIAGNOSIS — Z7982 Long term (current) use of aspirin: Secondary | ICD-10-CM | POA: Insufficient documentation

## 2013-11-11 DIAGNOSIS — E7800 Pure hypercholesterolemia, unspecified: Secondary | ICD-10-CM | POA: Insufficient documentation

## 2013-11-11 DIAGNOSIS — J449 Chronic obstructive pulmonary disease, unspecified: Secondary | ICD-10-CM | POA: Insufficient documentation

## 2013-11-11 DIAGNOSIS — Q054 Unspecified spina bifida with hydrocephalus: Secondary | ICD-10-CM | POA: Insufficient documentation

## 2013-11-11 DIAGNOSIS — J4489 Other specified chronic obstructive pulmonary disease: Secondary | ICD-10-CM | POA: Insufficient documentation

## 2013-11-11 DIAGNOSIS — Z87891 Personal history of nicotine dependence: Secondary | ICD-10-CM | POA: Insufficient documentation

## 2013-11-11 DIAGNOSIS — Y9389 Activity, other specified: Secondary | ICD-10-CM | POA: Insufficient documentation

## 2013-11-11 DIAGNOSIS — I2510 Atherosclerotic heart disease of native coronary artery without angina pectoris: Secondary | ICD-10-CM | POA: Insufficient documentation

## 2013-11-11 DIAGNOSIS — I10 Essential (primary) hypertension: Secondary | ICD-10-CM | POA: Insufficient documentation

## 2013-11-11 MED ORDER — HYDROCODONE-ACETAMINOPHEN 5-325 MG PO TABS: 2.0000 | ORAL_TABLET | ORAL | Status: DC | PRN
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
## 2013-11-11 NOTE — ED Provider Notes (Signed)
Medical screening examination/treatment/procedure(s) were performed by non-physician practitioner and as supervising physician I was immediately available for consultation/collaboration.  EKG Interpretation   None         Neeley Sedivy M Josee Speece, MD 11/11/13 2043 
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
## 2013-11-25 ENCOUNTER — Telehealth: Payer: Self-pay | Admitting: Pulmonary Disease

## 2013-11-25 MED ORDER — HYDROCODONE-ACETAMINOPHEN 5-325 MG PO TABS: ORAL_TABLET | ORAL | Status: DC

## 2013-11-25 NOTE — Telephone Encounter (Signed)
Called and spoke with pt and she will stop by today to pick up the rx.

## 2013-11-25 NOTE — Telephone Encounter (Signed)
Called and spoke with pt and she stated that she has been taking the potassium BID since her leg cramps started a day or so ago.  She stated that she is out of the hydrocodone so she has not been able to take any of these.    i advised the pt that SN normally recs for the leg cramps is to try the tonic water and she can take the mustard as well.  She stated that she is taking the mustard but she will try the tonic water.  i advised the pt that she should only take the potassium once daily as prescribed and pt stated that she will cut back on this.    Pt is requesting a refill of the hydrocodone.  She is aware that SN is out of the office today.  Will send to CY to sign RX.    No Known Allergies

## 2013-11-30 ENCOUNTER — Telehealth: Payer: Self-pay | Admitting: Pulmonary Disease

## 2013-11-30 MED ORDER — AZITHROMYCIN 250 MG PO TABS: ORAL_TABLET | ORAL | Status: DC

## 2013-11-30 NOTE — Telephone Encounter (Signed)
Pt c/o tickle in throat, throat congestion, cough with white phlegm and sore throat x 3 days. Denies fever. Currently using Coricidin. Would like to know if she needs to be using something else? Mucinex?  No Known Allergies  Please advise Dr Kriste Basque. Thanks.

## 2013-11-30 NOTE — Telephone Encounter (Signed)
Per SN: okay to call in ZPAK, mucinex, use saline nasal spray  I called and spoke with pt. Aware of recs. rx sent for zpak

## 2013-12-02 ENCOUNTER — Telehealth: Payer: Self-pay | Admitting: Pulmonary Disease

## 2013-12-02 NOTE — Telephone Encounter (Signed)
Form has been placed on SN cart to be completed and will fax back to her dentist with med list once completed.

## 2013-12-05 ENCOUNTER — Telehealth: Payer: Self-pay | Admitting: Pulmonary Disease

## 2013-12-05 NOTE — Telephone Encounter (Signed)
I called and spoke with pt. She c/o throat congestion, tickle in throat, PND, cough w/ white phlem, wheezing and chest tx no change. Denies any fever, no chills, no sweats. We called in [facility] 11/30/13. She finished this. She is still doing saline nasal spray and mucinex. Please advise TP thanks  No Known Allergies

## 2013-12-05 NOTE — Telephone Encounter (Signed)
Per TP: Zpak still needs time to work.  Recommend adding mucinex dm 600mg  1-2 twice daily as needed with plenty of fluids, saline nasal spray as needed for congestion, and call for ov or seek emergency attention if symptoms do not improve or they worsen.  Thanks.

## 2013-12-05 NOTE — Telephone Encounter (Signed)
lmtcb x1 for pt. 

## 2013-12-06 NOTE — Telephone Encounter (Signed)
Returning call can be reached at [phone] or [phone].[name]

## 2013-12-06 NOTE — Telephone Encounter (Signed)
Pt aware of recs.  Nothing further needed. 

## 2013-12-14 NOTE — Telephone Encounter (Signed)
Form has been completed by SN and a copy of the pts med list has been sent with this.  Nothing further is needed.

## 2013-12-16 ENCOUNTER — Ambulatory Visit (INDEPENDENT_AMBULATORY_CARE_PROVIDER_SITE_OTHER): Payer: Medicare HMO | Admitting: Cardiology

## 2013-12-16 ENCOUNTER — Encounter: Payer: Self-pay | Admitting: Cardiology

## 2013-12-16 VITALS — BP 116/75 | HR 57 | Ht 65.0 in | Wt 223.0 lb

## 2013-12-16 DIAGNOSIS — E7800 Pure hypercholesterolemia, unspecified: Secondary | ICD-10-CM

## 2013-12-16 DIAGNOSIS — I10 Essential (primary) hypertension: Secondary | ICD-10-CM

## 2013-12-16 DIAGNOSIS — I2510 Atherosclerotic heart disease of native coronary artery without angina pectoris: Secondary | ICD-10-CM

## 2013-12-16 NOTE — Progress Notes (Signed)
HPI: [name] is a pleasant female who has a history of coronary disease. She had a cardiac catheterization on January 18, 2004. At that time she was found to have mild irregularities in the left main. There was a 40% stenosis in the proximal LAD and a distal 30% lesion. The first diagonal had a 30-40% proximal lesion. The ostium of the LAD by intravascular ultrasound had a luminal area of 3.9 mm squared and percent stenosis 60%. She had nonobstructive disease in the left circumflex and right coronary. Her LV function was normal. Her last Myoview was in Dec 2010 and showed no significant abnormality and an ejection fraction was 64%. An echocardiogram was also performed in December of 2010 and revealed normal LV function and mild left atrial enlargement. I last saw her in Jan 2014. Since then the patient has dyspnea with more extreme activities but not with routine activities. It is relieved with rest. It is not associated with chest pain. There is no orthopnea, PND. There is no syncope or palpitations. There is no exertional chest pain. Occasional mild pedal edema.   Current Outpatient Prescriptions  Medication Sig Dispense Refill  . albuterol (PROVENTIL HFA;VENTOLIN HFA) 108 (90 BASE) MCG/ACT inhaler Inhale 2 puffs into the lungs every 4 (four) hours as needed. For wheeze or shortness of breath  1 Inhaler  6  . aspirin 81 MG tablet Take 81 mg by mouth daily.        Marland Kitchen azithromycin (ZITHROMAX) 250 MG tablet Take as directed  6 tablet  0  . EPIPEN 2-PAK 0.3 MG/0.3ML SOAJ injection       . ferrous sulfate 325 (65 FE) MG tablet Take 325 mg by mouth daily with breakfast.      . furosemide (LASIX) 40 MG tablet TAKE 1 TABLET (40 MG TOTAL) BY MOUTH DAILY.  30 tablet  6  . HYDROcodone-acetaminophen (NORCO/VICODIN) 5-325 MG per tablet Take 1 tablet by mouth TID prn for pain  90 tablet  0  . meloxicam (MOBIC) 15 MG tablet Take 1 tablet (15 mg total) by mouth daily. As needed for arthritis pain  30  tablet  6  . Multiple Vitamin (MULTIVITAMIN) capsule Take 1 capsule by mouth daily.        Marland Kitchen omeprazole (PRILOSEC) 20 MG capsule Take 20 mg by mouth daily.      . potassium chloride SA (KLOR-CON M20) 20 MEQ tablet TAKE 1 TABLET (20 MEQ TOTAL) BY MOUTH DAILY.  30 tablet  6  . pravastatin (PRAVACHOL) 40 MG tablet TAKE 1 TABLET (40 MG TOTAL) BY MOUTH DAILY.  30 tablet  6  . traZODone (DESYREL) 100 MG tablet Take 1 tablet (100 mg total) by mouth at bedtime.  30 tablet  5  . vitamin C (ASCORBIC ACID) 500 MG tablet Take 500 mg by mouth daily.      . budesonide-formoterol (SYMBICORT) 160-4.5 MCG/ACT inhaler Inhale 2 puffs into the lungs 2 (two) times daily.  1 Inhaler  5  . mometasone (NASONEX) 50 MCG/ACT nasal spray Place 2 sprays into the nose daily.  17 g  5   No current facility-administered medications for this visit.     Past Medical History  Diagnosis Date  . Allergic rhinitis   . Positive PPD   . COPD (chronic obstructive pulmonary disease)   . Hypertension   . CAD (coronary artery disease)   . Venous insufficiency   . Hypercholesteremia   . Obesity   . GERD (gastroesophageal  reflux disease)   . Hx of colonic polyps   . DJD (degenerative joint disease)   . Arnold-Chiari malformation   . History of absence seizures   . Reflex sympathetic dystrophy   . Varicose veins     Past Surgical History  Procedure Laterality Date  . Abdominal hysterectomy    . Cspine surgery  1993    for arnold-chiari malformation  . Right knee arthroscopy  12/2007    Dr. Tonita Cong  . 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 Tonita Cong  . Joint replacement  06/05/11    Left total knee replacement    History   Social History  . Marital Status: Widowed    Spouse Name: N/A    Number of Children: 2  . Years of Education: N/A   Occupational History  . retired    Social History Main Topics  . Smoking status: Former Smoker -- 0.30 packs/day for 12 years    Types: Cigarettes    Quit date: 01/02/2012    . Smokeless tobacco: Never Used     Comment: 1 pack per week  . Alcohol Use: No  . Drug Use: No  . Sexual Activity: Not on file   Other Topics Concern  . Not on file   Social History Narrative  . No narrative on file    ROS: improving URI but no fevers or chills, hemoptysis, dysphasia, odynophagia, melena, hematochezia, dysuria, hematuria, rash, seizure activity, orthopnea, PND, claudication. Remaining systems are negative.  Physical Exam: Well-developed obese in no acute distress.  Skin is warm and dry.  HEENT is normal.  Neck is supple.  Chest is clear to auscultation with normal expansion.  Cardiovascular exam is regular rate and rhythm.  Abdominal exam nontender or distended. No masses palpated. Extremities show no edema. neuro grossly intact  ECG sinus rhythm at a rate of 57. Nonspecific T-wave changes.

## 2013-12-16 NOTE — Assessment & Plan Note (Signed)
Continue aspirin and statin. 

## 2013-12-16 NOTE — Patient Instructions (Signed)
Your physician wants you to follow-up in: ONE YEAR WITH DR CRENSHAW You will receive a reminder letter in the mail two months in advance. If you don't receive a letter, please call our office to schedule the follow-up appointment.  

## 2013-12-16 NOTE — Assessment & Plan Note (Signed)
Blood pressure controlled. Continue present medications. Potassium and renal function monitored by primary care. 

## 2013-12-16 NOTE — Assessment & Plan Note (Signed)
Continue statin. Lipids and liver monitored by primary care. 

## 2013-12-22 ENCOUNTER — Other Ambulatory Visit: Payer: Self-pay | Admitting: Pulmonary Disease

## 2013-12-23 ENCOUNTER — Other Ambulatory Visit: Payer: Self-pay | Admitting: Pulmonary Disease

## 2013-12-26 ENCOUNTER — Telehealth: Payer: Self-pay | Admitting: Pulmonary Disease

## 2013-12-26 NOTE — Telephone Encounter (Signed)
Elie Confer, CMA at 12/14/2013 3:39 PM     Status: Signed        Form has been completed by SN and a copy of the pts med list has been sent with this. Nothing further is needed  ---   LMTCB x1 for pt

## 2013-12-27 NOTE — Telephone Encounter (Signed)
Pt aware. [name], CMA  

## 2013-12-27 NOTE — Telephone Encounter (Signed)
Pt returned call & can be reached at [phone] or [phone].  [name]

## 2013-12-27 NOTE — Telephone Encounter (Signed)
LMTCBx2 on home and cell number. Marlia Schewe, CMA  

## 2014-01-26 ENCOUNTER — Other Ambulatory Visit: Payer: Self-pay | Admitting: Pulmonary Disease

## 2014-03-23 ENCOUNTER — Ambulatory Visit (INDEPENDENT_AMBULATORY_CARE_PROVIDER_SITE_OTHER): Payer: Medicare HMO

## 2014-03-23 ENCOUNTER — Ambulatory Visit (INDEPENDENT_AMBULATORY_CARE_PROVIDER_SITE_OTHER): Payer: Medicare HMO | Admitting: Podiatry

## 2014-03-23 ENCOUNTER — Encounter: Payer: Self-pay | Admitting: Podiatry

## 2014-03-23 VITALS — BP 116/48 | HR 49 | Resp 16 | Ht 65.0 in | Wt 233.0 lb

## 2014-03-23 DIAGNOSIS — M2040 Other hammer toe(s) (acquired), unspecified foot: Secondary | ICD-10-CM

## 2014-03-23 DIAGNOSIS — M779 Enthesopathy, unspecified: Secondary | ICD-10-CM

## 2014-03-23 DIAGNOSIS — L84 Corns and callosities: Secondary | ICD-10-CM

## 2014-03-23 MED ORDER — TRIAMCINOLONE ACETONIDE 10 MG/ML IJ SUSP
10.0000 mg | Freq: Once | INTRAMUSCULAR | Status: AC
  Administered 2014-03-23: 10 mg

## 2014-03-23 NOTE — Progress Notes (Signed)
   Subjective:    Patient ID: [name], female    DOB: Jun 16, 1941, 73 y.o.   MRN: [id number]  HPI Comments: N painful knot L left 1st interdigital space and plantar area D 2weeks O C soft, painful corn and swelling A enclosed shoes, and walking T Epsom salt soaks, Neosporin     Review of Systems  Musculoskeletal: Positive for arthralgias.  All other systems reviewed and are negative.      Objective:   Physical Exam        Assessment & Plan:

## 2014-03-23 NOTE — Progress Notes (Signed)
Subjective:     Patient ID: [name], female   DOB: 02-07-1941, 73 y.o.   MRN: [id number]  HPI   Review of Systems  All other systems reviewed and are negative.      Objective:   Physical Exam Neurovascular status intact with patient being well oriented with no significant health history changes noted first interspace left shows keratotic lesion with inflammation around the base of the first metatarsal Alan she'll joint and keratotic tissue subsecond metatarsal left it's painful when pressed    Assessment:     Inflammatory capsulitis of the lateral side first MPJ along with keratotic lesion sub-metatarsal    Plan:     Reviewed condition and reviewed x-ray. Today careful injection administered around the area 3 mg dexamethasone Kenalog 5 mg Xylocaine and debrided plantar lesion. Gave instructions any redness drainage or swelling should occur to let us know immediately

## 2014-03-27 ENCOUNTER — Telehealth: Payer: Self-pay | Admitting: *Deleted

## 2014-03-27 NOTE — Telephone Encounter (Signed)
Saw him on Friday, toe is still sore.  How long will it stay sore?  It felt good on Friday but started hurting again on Saturday.  I called and left her a message on her mobile to call if she notices any redness, drainage or swelling for a follow-up appointment with Dr. Paulla Dolly.  I got a busy signal on her home phone.

## 2014-04-05 ENCOUNTER — Ambulatory Visit: Payer: Self-pay | Admitting: Podiatry

## 2014-04-14 ENCOUNTER — Ambulatory Visit (INDEPENDENT_AMBULATORY_CARE_PROVIDER_SITE_OTHER): Payer: Medicare HMO | Admitting: Podiatrist

## 2014-04-14 ENCOUNTER — Encounter: Payer: Self-pay | Admitting: Podiatrist

## 2014-04-14 VITALS — BP 137/81 | HR 63 | Resp 16

## 2014-04-14 DIAGNOSIS — S90129A Contusion of unspecified lesser toe(s) without damage to nail, initial encounter: Secondary | ICD-10-CM

## 2014-04-14 DIAGNOSIS — S90122A Contusion of left lesser toe(s) without damage to nail, initial encounter: Secondary | ICD-10-CM

## 2014-04-14 DIAGNOSIS — IMO0002 Reserved for concepts with insufficient information to code with codable children: Secondary | ICD-10-CM

## 2014-04-14 MED ORDER — SILVER SULFADIAZINE 1 % EX CREA: 1.0000 "application " | TOPICAL_CREAM | Freq: Every day | CUTANEOUS | Status: DC

## 2014-04-14 NOTE — Patient Instructions (Signed)
Instructions for Wound Care   Cleanse your foot with saline wash or warm soapy water (dial antibacterial soap or similar).  Blot dry.  Apply prescribed medication to your wound and cover with gauze and a bandage.  May hold bandage in place with Coban (self sticky wrap), Ace bandage or tape.  You may find dressing supplies at your local [employer], [employer], drug store or medical supply store.  Your prescribed topical medication is :  Silvadene Cream (twice daily)    If you notice any foul odor, increase in pain, pus, increased swelling, red streaks or generalized redness occurring in your foot or leg-Call our office immediately to be seen.  This may be a sign of a limb or life threatening infection that will need prompt attention.

## 2014-04-14 NOTE — Progress Notes (Signed)
   Subjective:    Patient ID: [name], female    DOB: 03-26-1941, 73 y.o.   MRN: [id number]  HPI Pain in big toe on left and right foot. Pt states that she has been using neosporin and soaking toe on left foot but hasnt noted any improvement since last time she was seen, denies, chills, fever or nausea, temp 98.3.   Review of Systems     Objective:   Physical Exam  Patient is awake, alert, and oriented x 3.  In no acute distress.  Vascular status is intact with palpable pedal pulses at 1/4 DP and PT bilateral and capillary refill time within normal limits. Neurological sensation is also intact bilaterally.. the patient's hallux is laterally deviated against the left second toe. There is very minimal space between the 2 digits and there's crowding between the 2 digits.  A blister is present on the lateral aspect of the left hallux and medial aspect of left second toe. It appears to be dried and macerated tissue. This is the area of pain and discomfort for the patient. No open ulceration is present with debridement of the blister noted. No redness, no swelling, no sign of infection is seen. Toenail itself is normal. The issue is the interspace left foot     Assessment & Plan:  Blister lateral side of left great and medial side of 2nd left foot-- Plan: The blister is debrided and a Silvadene as applied as well as a dry sterile dressing. I will see her back in 2 weeks for recheck of the foot

## 2014-04-28 ENCOUNTER — Ambulatory Visit: Payer: Medicare HMO | Admitting: Podiatrist

## 2014-05-12 ENCOUNTER — Encounter: Payer: Self-pay | Admitting: Podiatrist

## 2014-05-12 ENCOUNTER — Ambulatory Visit (INDEPENDENT_AMBULATORY_CARE_PROVIDER_SITE_OTHER): Payer: Medicare HMO | Admitting: Podiatrist

## 2014-05-12 VITALS — BP 115/68 | HR 59 | Resp 18

## 2014-05-12 DIAGNOSIS — B351 Tinea unguium: Secondary | ICD-10-CM

## 2014-05-12 DIAGNOSIS — M2040 Other hammer toe(s) (acquired), unspecified foot: Secondary | ICD-10-CM

## 2014-05-12 DIAGNOSIS — M79609 Pain in unspecified limb: Secondary | ICD-10-CM

## 2014-05-12 DIAGNOSIS — IMO0002 Reserved for concepts with insufficient information to code with codable children: Secondary | ICD-10-CM

## 2014-05-12 DIAGNOSIS — L97509 Non-pressure chronic ulcer of other part of unspecified foot with unspecified severity: Secondary | ICD-10-CM

## 2014-05-12 NOTE — Progress Notes (Signed)
I AM BETTER THAN I WAS ON MY LEFT BIG TOE AND IN BETWEEN MY 2ND TOE AND THE RIGHT GREAT TOE IS SORE ON THE TIP AND I NEED MY TOENAILS TRIMMED    Patient presents today for followup of ulceration between the left first and second toes. She also has soreness and tenderness at the tip of the right great toe and wants to make sure there is no blister present. She is also requesting debridable today.  Patient is awake, alert, and oriented x 3. In no acute distress. Vascular status is intact with palpable pedal pulses at 1/4 DP and PT bilateral and capillary refill time within normal limits. Neurological sensation is also intact bilaterally.. the patient's hallux is laterally deviated against the left second toe which is shortened due to her previous surgery.. There is very minimal space between the 2 digits and there's crowding between the 2 digits.  A healed blister is present on the lateral aspect of the left hallux and medial aspect of left second toe. It appears to be dried and macerated tissue at today's visit. No redness, no swelling, no sign of infection is seen. Right hallux distal tip has tightness of tissues and slight swelling. No sign of ulceration is present. Patient's toenails are elongated, thickened, discolored, dystrophic on bilateral feet.  Assessment: Ulceration/macerated tissue first interspace left foot, symptomatic mycotic toenails  Plan: Debrided the macerated tissue and applied Silvadene cream and a space was dispensed. Debridement of symptomatic mycotic toenails. I will see her back in 2 months for followup. If any problems or concerns arise in the meantime she is instructed to call. She is given instructions for after care and followup.

## 2014-05-13 ENCOUNTER — Other Ambulatory Visit: Payer: Self-pay | Admitting: Pulmonary Disease

## 2014-06-14 ENCOUNTER — Encounter: Payer: Self-pay | Admitting: Podiatrist

## 2014-06-14 ENCOUNTER — Ambulatory Visit (INDEPENDENT_AMBULATORY_CARE_PROVIDER_SITE_OTHER): Payer: Medicare HMO | Admitting: Podiatrist

## 2014-06-14 VITALS — BP 123/72 | HR 63 | Resp 18

## 2014-06-14 DIAGNOSIS — L97521 Non-pressure chronic ulcer of other part of left foot limited to breakdown of skin: Secondary | ICD-10-CM

## 2014-06-14 DIAGNOSIS — L97509 Non-pressure chronic ulcer of other part of unspecified foot with unspecified severity: Secondary | ICD-10-CM

## 2014-06-14 MED ORDER — HYDROCODONE-ACETAMINOPHEN 5-325 MG PO TABS: 1.0000 | ORAL_TABLET | Freq: Four times a day (QID) | ORAL | Status: DC | PRN

## 2014-06-15 NOTE — Progress Notes (Signed)
Patient presents today for followup of ulceration between the left first and second toes. She has been using the silver cream and states the soreness came back not long after her last visit.  Patient is awake, alert, and oriented x 3. In no acute distress. Vascular status is intact with palpable pedal pulses at 1/4 DP and PT bilateral and capillary refill time within normal limits. Neurological sensation is also intact bilaterally.. the patient's hallux is laterally deviated against the left second toe which is shortened due to her previous surgery.. There is very minimal space between the 2 digits and there's crowding between the 2 digits.  A callus is present on the medial aspect of the stump of the 2nd toe from the pressure from the great toe.  No redness, no swelling, no sign of infection is seen.   Assessment: hyperkeratotic lesion /macerated tissue first interspace left foot,  Plan: Debrided the macerated tissue and applied iodosorb and a dressing .  Recommended use of castellani's paint at home to clear up the maceration present.  She will be seen back in 4 weeks for recheck and debridement.  Recommended use of a spacer at home.

## 2014-07-05 ENCOUNTER — Encounter: Payer: Self-pay | Admitting: Podiatrist

## 2014-07-05 ENCOUNTER — Ambulatory Visit (INDEPENDENT_AMBULATORY_CARE_PROVIDER_SITE_OTHER): Payer: Medicare HMO | Admitting: Podiatrist

## 2014-07-05 VITALS — BP 130/82 | HR 86 | Resp 18

## 2014-07-05 DIAGNOSIS — M2040 Other hammer toe(s) (acquired), unspecified foot: Secondary | ICD-10-CM

## 2014-07-05 DIAGNOSIS — L97521 Non-pressure chronic ulcer of other part of left foot limited to breakdown of skin: Secondary | ICD-10-CM

## 2014-07-05 DIAGNOSIS — L97509 Non-pressure chronic ulcer of other part of unspecified foot with unspecified severity: Secondary | ICD-10-CM

## 2014-07-05 NOTE — Progress Notes (Signed)
  Patient presents today for followup of ulceration between the left first and second toes. She has been using the castellani's paint and relates the area has become significantly less tender and sore since her previous visits.   Patient is awake, alert, and oriented x 3. In no acute distress. Vascular status is intact with palpable pedal pulses at 1/4 DP and PT bilateral and capillary refill time within normal limits. Neurological sensation is also intact bilaterally.. the patient's hallux is laterally deviated against the left second toe which is shortened due to her previous surgery.. There is very minimal space between the 2 digits and there's crowding between the 2 digits.  A ulcer/ lesion is present on the medial aspect of the stump of the 2nd toe from the pressure from the great toe. No redness, no swelling, no sign of infection is seen.   Assessment: hyperkeratotic lesion /macerated tissue first interspace left foot,   Plan: Debrided the tissue and applied castellani's paint and a 2x2 as a spacer. Recommended continued use of castellani's paint at home to clear up the maceration present. She will be seen back in 4 weeks for recheck and debridement. Recommended use of a spacer at home.

## 2014-07-06 ENCOUNTER — Other Ambulatory Visit: Payer: Self-pay | Admitting: Pulmonary Disease

## 2014-07-06 MED ORDER — FUROSEMIDE 40 MG PO TABS: ORAL_TABLET | ORAL | Status: DC

## 2014-07-06 MED ORDER — TRAZODONE HCL 100 MG PO TABS: ORAL_TABLET | ORAL | Status: DC

## 2014-07-06 MED ORDER — PRAVASTATIN SODIUM 40 MG PO TABS: ORAL_TABLET | ORAL | Status: DC

## 2014-07-14 ENCOUNTER — Ambulatory Visit: Payer: Medicare HMO | Admitting: Podiatrist

## 2014-07-25 ENCOUNTER — Encounter: Payer: Self-pay | Admitting: Pulmonary Disease

## 2014-08-02 ENCOUNTER — Ambulatory Visit: Payer: Medicare HMO | Admitting: Podiatrist

## 2014-08-04 ENCOUNTER — Ambulatory Visit (INDEPENDENT_AMBULATORY_CARE_PROVIDER_SITE_OTHER): Payer: Medicare HMO | Admitting: Podiatrist

## 2014-08-04 ENCOUNTER — Encounter: Payer: Self-pay | Admitting: Podiatrist

## 2014-08-04 VITALS — BP 137/86 | HR 58 | Resp 16 | Ht 65.0 in | Wt 220.0 lb

## 2014-08-04 DIAGNOSIS — L84 Corns and callosities: Secondary | ICD-10-CM

## 2014-08-04 DIAGNOSIS — M2040 Other hammer toe(s) (acquired), unspecified foot: Secondary | ICD-10-CM

## 2014-08-04 DIAGNOSIS — I872 Venous insufficiency (chronic) (peripheral): Secondary | ICD-10-CM

## 2014-08-04 NOTE — Progress Notes (Signed)
Chief Complaint  Patient presents with  . Foot Ulcer    "It started hurtin' back on Tuesday." left 1, 2nd toes  . Callouses    "It hurts at this callous too."  left 2nd MPJ     Patient presents today for followup of ulceration between the left first and second toes. She has been using the castellani's paint and relates the area has become significantly less tender and sore since her previous visits. It has just started acting back up in the last few days.   Patient is awake, alert, and oriented x 3. In no acute distress. Vascular status is intact with palpable pedal pulses at 1/4 DP and PT bilateral and capillary refill time within normal limits. Neurological sensation is also intact bilaterally.. the patient's hallux is laterally deviated against the left second toe which is shortened due to her previous surgery.. There is very minimal space between the 2 digits and there's crowding between the 2 digits.  A healed ulcer/ hard lesion is present on the medial aspect of the stump of the 2nd toe from the pressure from the great toe. No redness, no swelling, no sign of infection is seen.   Assessment: hyperkeratotic lesion /macerated tissue first interspace left foot,   Plan: Debrided the tissue and applied tubefoam as a spacer on the great toe.  Recommend she continue to do the same. Also recommended continued use of castellani's paint at home as she has completely cleared the macerated tissue up and we want her to continue this.  She will be seen back in 4 weeks for recheck and debridement.

## 2014-08-24 ENCOUNTER — Other Ambulatory Visit: Payer: Self-pay | Admitting: Pulmonary Disease

## 2014-08-25 ENCOUNTER — Telehealth: Payer: Self-pay | Admitting: Pulmonary Disease

## 2014-08-25 MED ORDER — PRAVASTATIN SODIUM 40 MG PO TABS: ORAL_TABLET | ORAL | Status: DC

## 2014-08-25 NOTE — Telephone Encounter (Signed)
Called pt. LMTCB X1

## 2014-08-25 NOTE — Telephone Encounter (Signed)
Pt returning call.[name] ° °

## 2014-08-25 NOTE — Telephone Encounter (Signed)
lmtcb x1 for pt. 

## 2014-08-25 NOTE — Telephone Encounter (Signed)
Pt scheduled to see Dr Lenna Gilford Oct 1 at 10am - aware to bring all meds to visit Pravastatin 40mg  refill sent to pt pharmacy [facility] Nothing further needed.

## 2014-08-25 NOTE — Telephone Encounter (Signed)
Pt returned call & can be reached at [phone].  [name]

## 2014-08-25 NOTE — Telephone Encounter (Signed)
Pt calling requesting refill of Crestor No history of Crestor being sent  Pt was not aware that SN no longer seeing Primary care patients. Pt has requested to remain a patient of SN  Please advise Dr Lenna Gilford. Thanks.

## 2014-08-25 NOTE — Telephone Encounter (Signed)
Per SN---  Last seen 07/2013 and she was on pravastatin 40 Her med list says pravastatin 40. She will need ROV with SN and bring all meds in to this appt with her. Ok to call in the pravastatin 40 mg.  thanks

## 2014-08-31 ENCOUNTER — Encounter: Payer: Self-pay | Admitting: Pulmonary Disease

## 2014-08-31 ENCOUNTER — Ambulatory Visit (INDEPENDENT_AMBULATORY_CARE_PROVIDER_SITE_OTHER): Payer: Medicare HMO | Admitting: Pulmonary Disease

## 2014-08-31 ENCOUNTER — Other Ambulatory Visit (INDEPENDENT_AMBULATORY_CARE_PROVIDER_SITE_OTHER): Payer: Medicare HMO

## 2014-08-31 VITALS — BP 112/80 | HR 46 | Temp 97.6°F | Ht 65.0 in | Wt 229.0 lb

## 2014-08-31 DIAGNOSIS — E7800 Pure hypercholesterolemia, unspecified: Secondary | ICD-10-CM

## 2014-08-31 DIAGNOSIS — D126 Benign neoplasm of colon, unspecified: Secondary | ICD-10-CM | POA: Diagnosis not present

## 2014-08-31 DIAGNOSIS — F419 Anxiety disorder, unspecified: Secondary | ICD-10-CM | POA: Diagnosis not present

## 2014-08-31 DIAGNOSIS — M5441 Lumbago with sciatica, right side: Secondary | ICD-10-CM

## 2014-08-31 DIAGNOSIS — I2510 Atherosclerotic heart disease of native coronary artery without angina pectoris: Secondary | ICD-10-CM

## 2014-08-31 DIAGNOSIS — I10 Essential (primary) hypertension: Secondary | ICD-10-CM

## 2014-08-31 DIAGNOSIS — E669 Obesity, unspecified: Secondary | ICD-10-CM

## 2014-08-31 DIAGNOSIS — K2100 Gastro-esophageal reflux disease with esophagitis, without bleeding: Secondary | ICD-10-CM

## 2014-08-31 DIAGNOSIS — I872 Venous insufficiency (chronic) (peripheral): Secondary | ICD-10-CM

## 2014-08-31 DIAGNOSIS — F411 Generalized anxiety disorder: Secondary | ICD-10-CM | POA: Insufficient documentation

## 2014-08-31 DIAGNOSIS — M150 Primary generalized (osteo)arthritis: Secondary | ICD-10-CM

## 2014-08-31 DIAGNOSIS — Z23 Encounter for immunization: Secondary | ICD-10-CM

## 2014-08-31 DIAGNOSIS — M8949 Other hypertrophic osteoarthropathy, multiple sites: Secondary | ICD-10-CM

## 2014-08-31 DIAGNOSIS — M159 Polyosteoarthritis, unspecified: Secondary | ICD-10-CM

## 2014-08-31 DIAGNOSIS — J449 Chronic obstructive pulmonary disease, unspecified: Secondary | ICD-10-CM

## 2014-08-31 LAB — IBC PANEL
Iron: 70 ug/dL (ref 42–145)
Saturation Ratios: 20 % (ref 20.0–50.0)
Transferrin: 250.2 mg/dL (ref 212.0–360.0)

## 2014-08-31 LAB — CBC WITH DIFFERENTIAL/PLATELET
Basophils Absolute: 0 10*3/uL (ref 0.0–0.1)
Basophils Relative: 0.4 % (ref 0.0–3.0)
Eosinophils Absolute: 0.1 10*3/uL (ref 0.0–0.7)
Eosinophils Relative: 1.8 % (ref 0.0–5.0)
HCT: 36 % (ref 36.0–46.0)
Hemoglobin: 12 g/dL (ref 12.0–15.0)
Lymphocytes Relative: 50.4 % — ABNORMAL HIGH (ref 12.0–46.0)
Lymphs Abs: 1.8 10*3/uL (ref 0.7–4.0)
MCHC: 33.3 g/dL (ref 30.0–36.0)
MCV: 81.2 fl (ref 78.0–100.0)
Monocytes Absolute: 0.3 10*3/uL (ref 0.1–1.0)
Monocytes Relative: 8.2 % (ref 3.0–12.0)
Neutro Abs: 1.4 10*3/uL (ref 1.4–7.7)
Neutrophils Relative %: 39.2 % — ABNORMAL LOW (ref 43.0–77.0)
Platelets: 187 10*3/uL (ref 150.0–400.0)
RBC: 4.43 Mil/uL (ref 3.87–5.11)
RDW: 14 % (ref 11.5–15.5)
WBC: 3.6 10*3/uL — ABNORMAL LOW (ref 4.0–10.5)

## 2014-08-31 LAB — TSH: TSH: 0.81 u[IU]/mL (ref 0.35–4.50)

## 2014-08-31 LAB — HEPATIC FUNCTION PANEL
ALT: 16 U/L (ref 0–35)
AST: 20 U/L (ref 0–37)
Albumin: 3.7 g/dL (ref 3.5–5.2)
Alkaline Phosphatase: 76 U/L (ref 39–117)
Bilirubin, Direct: 0.1 mg/dL (ref 0.0–0.3)
Total Bilirubin: 0.6 mg/dL (ref 0.2–1.2)
Total Protein: 7.4 g/dL (ref 6.0–8.3)

## 2014-08-31 LAB — LIPID PANEL
Cholesterol: 125 mg/dL (ref 0–200)
HDL: 52.8 mg/dL
LDL Cholesterol: 50 mg/dL (ref 0–99)
NonHDL: 72.2
Total CHOL/HDL Ratio: 2
Triglycerides: 112 mg/dL (ref 0.0–149.0)
VLDL: 22.4 mg/dL (ref 0.0–40.0)

## 2014-08-31 LAB — BASIC METABOLIC PANEL WITH GFR
BUN: 13 mg/dL (ref 6–23)
CO2: 29 meq/L (ref 19–32)
Calcium: 9.2 mg/dL (ref 8.4–10.5)
Chloride: 106 meq/L (ref 96–112)
Creatinine, Ser: 0.8 mg/dL (ref 0.4–1.2)
GFR: 96.31 mL/min
Glucose, Bld: 88 mg/dL (ref 70–99)
Potassium: 4.3 meq/L (ref 3.5–5.1)
Sodium: 140 meq/L (ref 135–145)

## 2014-08-31 MED ORDER — HYDROCODONE-ACETAMINOPHEN 5-325 MG PO TABS: ORAL_TABLET | ORAL | Status: DC

## 2014-08-31 MED ORDER — PRAVASTATIN SODIUM 40 MG PO TABS: ORAL_TABLET | ORAL | Status: DC

## 2014-08-31 NOTE — Patient Instructions (Signed)
Today we updated your med list in our EPIC system...    Continue your current medications the same...    We refilled your meds per request...  We gave you the 2015 Flu vaccine today...  Today we did your follow up CXR & FASTING blood work...    We will contact you w/ the results when available...   Let's get on track w/ our diet & exercise therapy, the goal is to lose 15-20 lbs!  Call for any questions...  Let's plan a follow up visit in 55yr, sooner if needed for problems.Marland KitchenMarland Kitchen

## 2014-08-31 NOTE — Progress Notes (Signed)
Subjective:    Patient ID: [name], female    DOB: [birth year], 73 y.o.   MRN: [id number]  HPI 73 y/o BF here for a follow up of her mult med problems including: Allergies, HBP, CAD, Chol, Obesity, etc... she is also followed by DrCrenshaw for Cards, & DrESL for allergies...  ~  July 29, 2012:  58mo ROV & Ahonesty notes that her prev back discomfort is better w/ prn Mobic & Vicodin (requests refills today)... Now c/o cramps in her legs & wonders if she needs to incr her potassium (we will check level=3.8);  She has Lasix40 but choses to take it intermittently noting that she doesn't pee as much if she takes one daily vis-a-vis 2 Qod;  Still on the K20 taking one daily;  Weight is down 14# to 234# today...    She had f/u VenDopplers by [facility] 5/13> no evid DVT, left GSV ablated, other VV noted; no reflux seen & the fitted her for knee high compression hose but they are "too hard to wear every day"...    We reviewed prob list, meds, xrays and labs> see below for updates >>  LABS 8/13:  FLP- at goals on Prav40;  Chems- all wnl;  CBC- ok w/ Hg=11.8 MCV=81 Fe=49 (12%sat);  TSH=1.38... rec to start FeSo4 daily.   ~  January 28, 2013:  44mo ROV & her CC is leg discomfort for which she has been eval by Alpha Gula- we do not have his notes but she reports XRays OK & she reports that steroid pack x8d really helped... We reviewed the following medical problems during today's office visit >>     COPD> smoker, hx pos PPD, AR> on Symbicort160, Proair prn, Nasonex prn; she says she quit smoking 41mo ago & she is encouraged to remain quit! She denies cough, sput, hemoptysis, ch in dyspnea, etc...    HBP> on Lasix40, K20; BP= 122/80 & she denies CP, palpit, dizzy, SOB, worsening edema...    CAD> on ASA81; followed by drCrenshaw & seen 1/14- stable & no changes made...    VI> she knows to elim sodium, elev legs, wear support hose, & take the Lasix40...    Chol> on Prav40; last FLP 8/13 looked good w/ TChol 141, TG  92, HDL 63, LDL 60    Overweight> wt= 232# which is down 2#, but her BMI is 39 & she has a long way to go...    GI- GERD, Polyps, known gallstones> on Omep20; she denies abd pain, dysphagia, n/v, c/d, blood seen...    DJD, s/p left TKR, LBP> on Mobic15, Aleve prn, Vicodin prn; managed by Ortho- DrBeane...    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    Arnold Chiari malformation & hx seizures> she had a suboccipital craniectomy and C1 laminectomy for Arnold-Chiari malformation by DrKritzer in 1993.     Insomnia> on Ambien10 prn... We reviewed prob list, meds, xrays and labs> see below for updates >>   ~  July 28, 2013:  20mo ROV & [place] is c/o "old age" and "aches & pains"...  We reviewed the following medical problems during today's office visit >>     COPD> ex-smoker, hx pos PPD, AR> on Symbicort160, Proair prn, Nasonex prn; she says she quit smoking end of 2013- denies cough, sput, hemoptysis, ch in dyspnea, etc...    HBP> on Lasix40, K20; BP= 108/70 & she denies CP, palpit, dizzy, SOB, worsening edema...    CAD> on  BTD17; followed by Hilary Hertz & seen 1/14- stable & no changes made...    VI> she had laser ablation of left greater saphenous vein 2013 (painful varicosities) by Sheryn Bison; f/u Venous Dopplers 4/14 showed no evid of DVT...    Chol> on Prav40; FLP 8/14 looked good w/ TChol 133, TG 101, HDL 54, LDL 59    Overweight> wt= 230# which is down 2#, but her BMI is 39 & she has a long way to go...    GI- GERD, Polyps, known gallstones> on Omep20; she denies abd pain, dysphagia, n/v, c/d, blood seen...    DJD, s/p bilat TKRs, LBP> on Mobic15, Aleve prn, Vicodin prn; managed by Ortho- DrBeane...    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    Arnold Chiari malformation & hx seizures> she had a suboccipital craniectomy and C1 laminectomy for Arnold-Chiari malformation by DrKritzer in 1993.     Insomnia> prev on Ambien10 prn, now on Desyrel50 => incr to 100mg   qhs...  We reviewed prob list, meds, xrays and labs> see below for updates >>   LABS 8/14:  FLP- at goals on Prav40;  Chems- wnl;  CBC- Hg=11.3, Fe=47 (14%sat);  TSH= 0.96;  VitD= 48...  ~  August 31, 2014:  19mo ROV & [name] is c/o some LBP after moving some furniture- rec to try ice/heat, Tylenol, and let me know if getting worse for Ortho referral;  She has been seeing Podiatry re- hammertoes/ corns/ callouses and may need surg;  We reviewed the following medical problems during today's office visit >>     AR> she sees DrVanWinkle for allergies and "asthma" on shots one per month now...    COPD> ex-smoker, hx pos PPD, AR> on Symbicort160, Proair prn, Flonase prn; she says she quit smoking end of 2013- denies cough, sput, hemoptysis, ch in dyspnea, etc...    HBP> on Lasix40, K20; BP= 112/80 & she denies CP, palpit, dizzy, SOB, worsening edema...    CAD> on ASA81; followed by DrCrenshaw & seen 1/15- stable & no changes made...    VI> she had laser ablation of left greater saphenous vein 2013 (painful varicosities) by Sheryn Bison; f/u Venous Dopplers 4/14 showed no evid of DVT...    Chol> on Prav40; FLP 10/15 looks good w/ TChol 125, TG 112, HDL 53, LDL 50    Overweight> wt= 229# which is down 1#, but her BMI is 39 & she has a long way to go...    GI- GERD, Polyps, known gallstones> on Omep20; she denies abd pain, dysphagia, n/v, c/d, blood seen...    DJD, s/p bilat TKRs, LBP> on Vicodin prn; managed by Ortho- DrBeane & we do not have notes from him...    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    Arnold Chiari malformation & hx seizures> she had a suboccipital craniectomy and C1 laminectomy for Arnold-Chiari malformation by DrKritzer in 1993.     Insomnia> prev on Ambien10 prn, now on Desyrel100mg  qhs...  We reviewed prob list, meds, xrays and labs> see below for updates >>   LABS 10/15:  FLP- at goals on Prav40;  Chems- wnl;  CBC- ok w/ Hg=12.0 MCV=81 Fe=70(20%) & rec to  continue Fe daily;  TSH=0.81...           Problem List:  ALLERGIC RHINITIS (ICD-477.9) > on NASONEX, ASTEPRO, ZYRTEK, & allergy shots- followed by DrVanWinkle... POSITIVE PPD (ICD-795.5) CIGARETTE SMOKER CHRONIC OBSTRUCTIVE ASTHMA UNSPECIFIED (ICD-493.20) - she  had +allergy skin testing for dust mites and started on allergy shots per DrESL along w/ SYMBICORT 160- 2spBid, but she doesn't take anything regularly... +smoker but decreased from 1/2 ppd x 2yrs to 1-2 cig/d. ~  2/11: she reports +PPD on pre-employment testing> sent to health dept, CXR- neg, & started on INH 300mg /d x41mo (they never sent records for [place] to review). ~  CXR 2/11 showed borderline heart size, tort Ao, sl incr markings, NAD... ~  subseq CXRs done at health dept for her pos PPD but we don't have the films or reports... ~  2/13: Refractory AB treated via ER w/ Doxy, Pred, restart Symbicort, +Mucinex, etc; we decided upon Levaquin, Medrol, MMW... ~  CXR 2/13 showed mild cardiomeg, ectatic Ao & coronary calcif, DJD sp, NAD; CTAngio was neg for PE, +bronchial wall thickening, NAD.Marland Kitchen. ~  8/13: she tells me "I saw my asthma doctor last week- doing OK" no changes in meds from DrVanWinkle... ~  2/14: on Symbicort160, Proair prn, Nasonex prn; she says she quit smoking 68mo ago & she is encouraged to remain quit! She denies cough, sput, hemoptysis, ch in dyspnea, etc. ~  8/14: Hx COPD> ex-smoker, hx pos PPD, AR> on Symbicort160, Proair prn, Nasonex prn; she says she quit smoking end of 2013- denies cough, sput, hemoptysis, ch in dyspnea, etc. ~  10/15: on Symbicort160, Proair prn, Flonase prn; she says she quit smoking end of 2013- denies cough, sput, hemoptysis, ch in dyspnea, etc; still gets alergy shots once per month now...  Hx of HYPERTENSION (ICD-401.9) - off Lisinopril/Hct (Hx ACE reaction) & taking LASIX 40mg  as needed for swelling... ~  EKG 6/12 showed SBrady w/ rate= 55, otherw WNL, NAD.Marland Kitchen. ~  5/13:  BP=120/78 today and denies  HA, fatigue, visual changes, CP, palipit, dizziness, syncope, dyspnea, edema, etc... ~  8/13:  BP= 124/82 & she remains largely asymtomatic... ~  2/14: on Lasix40, K20; BP= 122/80 & she denies CP, palpit, dizzy, SOB, worsening edema.  ~  8/14: on Lasix40, K20; BP= 108/70 & she remains asymptomatic... ~  10/15: on Lasix40, K20; BP= 112/80 & she denies CP, palpit, dizzy, SOB, worsening edema  CAD (ICD-414.00) - known non-obstructive disease & atypic CP... on ASA 325mg /d, STATIN (Prav40), & off ACE. ~  cath 2/05 showed 20-60% obstructions in all 3 vessels... good LVF... ~  NuclearStressTest 3/09 was negative- no ischemia or infarction, EF=68%... ~  Eval for recurrent CP- EKG showed NSR, NAD;  2DEcho showed mild focal basal septal hypertrophy, norm wall motion w/ EF= 55-65%, mild DD & PAsys= 37;  Myoview NEG- no scar, no ischemia, EF=64%... ~  ROV w/ DrCrenshaw 7/11 reviewed- continue ASA/ Statin/ smoking cessation/ monitor BP & Chol. ~  She saw DrCrenshaw 1/13 for f/u CAD> last Myoview & 2DEcho were in 2010; she's been stable on ASA81mg /d, Lasix, KCl, Prav40; she denies angina & DrCrenshaw rec quit smoking, same meds, lose weight... Note> CTA 2/13 showed atherosclerotic changes seen in Ao & coronaries, mild cardiomegaly ~  She had yearly f/u DrCrenshaw 1/14> felt to be stable, no changes made;  EKG showed SBrady, rate 49, NSSTTWA... ~  on ASA81; followed by DrCrenshaw & seen 1/15- stable & no changes made...  VENOUS INSUFFICIENCY (ICD-459.81) - she follows a low salt diet... +LASIX 40mg /d for swelling... ~  1/10: we discussed no salt, Lasix20, elevation, support hose. ~  She was evaluated by Sheryn Bison VVS w/ severe ven insuffic left leg w/ pain & chr swelling, bulging varicosities; using  compression hose, no salt, elevation, & Ibuprofen w/o improvement;  He plans laser ablation of the GSV & then he'll consider stab phlebectomy of secondary varicosities later... ~  4/13: s/p left GSV laser ablation; she  is feeling better w/ decr pain etc after the procedure... ~  5/13:  F/u VenDopplers by [facility] w/ good ablation, no evid DVT etc... ~  4/14:  She had f/u DrLawson> stable, no DVT, rec compression hose...  HYPERCHOLESTEROLEMIA (ICD-272.0) - on PRAVASTATIN 40mg /d now + diet efforts. ~  FLP 5/08 on Cres5 showed TChol 115, TG 83, HDL 50, LDL 49... tolerating well and taking med regularly. ~  [place] 3/09 on Cres5 showed TChol 126, TG 104, HDL 56, LDL 49... rec- same. ~  FLP 3/10 on Cres5 showed TChol 152, TG 97, HDL 77, LDL 55 ~  FLP 12/10 on Cres5 showed TChol 127, TG 100, HDL 52, LDL 55 ~  7/11:  pt requests change to less expensive statin> try PRAVASTATIN40 ~  FLP 10/11 on Prav40 showed TChol 143, TG 125, HDL 51, LDL 67... Continue same. ~  FLP 6/12 on Prav40 showed TChol 143, TG 145, HDL 61, LDL 53 ~  FLP 8/13 on Prav40 showed TChol 141, TG 92, HDL 63, LDL 60 ~  FLP 8/14 on Prav40 showed TChol 133, TG 101, HDL 54, LDL 59 ~  FLP 10/15 on Prav40 showed TChol 125, TG 112, HDL 53, LDL 50  OBESITY (ICD-278.00) - unable to diet effectively and get weight down...  ~  weight 5/10 = 238#, 5\' 5"  tall,  BMI= 40... we discussed diet and exercise program... again! ~  weight 1/11 = 228# ~  weight 7/11 = 232# ~  weight 10/11 = 230# ~  Weight 6/12 = 232# ~  Weight 10/12 = 234# ~  Weight 2/13 = 241# ~  Weight 4/13 = 248#... She is asked to get wt down!!! ~  Weight 8/13 = 234#... Great job! ~  [employer] 2/14 = 232# ~  Weight 8/14 = 230# ~  Weight 10/15 = 229#  GERD (ICD-530.81) - EGD 7/05 by DrStark w/ HH- supposed to be on PPI (Omeprazole 20mg ) & H2 blocker (Pepcid 20mg ) at bedtime... ~  2/14: she takes the Omep20 daily but using the Zantac as needed...  COLONIC POLYPS (ICD-211.3) - last colonoscopy was 12/03 showing several 3-84mm polyps (hyperplastic)...  HX OF GALLSTONE (ICD-V12.79) - seen on CT 7/06 and referred to [facility]- eval by DrCornett and being followed...  DEGENERATIVE JOINT DISEASE (ICD-715.90) -  followed by DrBeane for ortho... Right knee arthroscopy 1/09 without help... s/p right TKR 06/12/08 by Alpha Gula... 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 she is off the Etodolac & takes PERCOCET Prn... ~  1/11: now complaining of left knee pain & will f/u w/ DrBeane... ~  6/12:  DrBeane plans left TKR soon... OK for surg... ~  7/12: s/p left TKR per DrBeane & went to [facility] for rehab... ~  8/13:  She requests refill MOBIC 15mg  as needed... ~  10/15: on Vicodin prn, we do not have notes from Ortho...  BACK PAIN, LUMBAR (ICD-724.2) >> presented 4/13 w/ pain in left side of back & radiating down leg leg... Given Vicodin, anti-inflamm med & muscle relaxer;  Referred to Ortho for further eval==> ?if she ever saw Ortho, pain resolved w/ Mobic, Norco, rest, heat, etc...  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) & SEIZURES, HX OF (ICD-V12.49) - she had a suboccipital craniectomy and C1 laminectomy for Arnold-Chiari  malformation by DrKritzer in 1993...  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   Past Surgical History  Procedure Laterality Date  . Abdominal hysterectomy    . Cspine surgery  1993    for arnold-chiari malformation  . Right knee arthroscopy  12/2007    Dr. Tonita Cong  . 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 Tonita Cong  . Joint replacement  06/05/11    Left total knee replacement    Outpatient Encounter Prescriptions as of 08/31/2014  Medication Sig  . albuterol (PROVENTIL HFA;VENTOLIN HFA) 108 (90 BASE) MCG/ACT inhaler Inhale 2 puffs into the lungs every 4 (four) hours as needed. For wheeze or shortness of breath  . aspirin 81 MG tablet Take 81 mg by mouth daily.    Marland Kitchen azelastine (ASTELIN) 0.1 % nasal spray Place 1 spray into both nostrils 2 (two) times daily.   . budesonide-formoterol (SYMBICORT) 160-4.5 MCG/ACT inhaler Inhale 2 puffs into the lungs 2 (two) times daily.  Marland Kitchen EPIPEN 2-PAK 0.3 MG/0.3ML  SOAJ injection   . ferrous sulfate 325 (65 FE) MG tablet Take 325 mg by mouth daily with breakfast.  . fluticasone (FLONASE) 50 MCG/ACT nasal spray Use as needed  . furosemide (LASIX) 40 MG tablet TAKE 1 TABLET (40 MG TOTAL) BY MOUTH DAILY.  Marland Kitchen HYDROcodone-acetaminophen (NORCO/VICODIN) 5-325 MG per tablet Take 1 tablet by mouth TID prn for pain  . Multiple Vitamin (MULTIVITAMIN) capsule Take 1 capsule by mouth daily.    Marland Kitchen omeprazole (PRILOSEC) 20 MG capsule Take 20 mg by mouth daily.  . potassium chloride SA (KLOR-CON M20) 20 MEQ tablet TAKE 1 TABLET (20 MEQ TOTAL) BY MOUTH DAILY.  . pravastatin (PRAVACHOL) 40 MG tablet TAKE 1 TABLET (40 MG TOTAL) BY MOUTH DAILY.  . traZODone (DESYREL) 100 MG tablet TAKE 1 TABLET BY MOUTH AT BEDTIME  . [DISCONTINUED] HYDROcodone-acetaminophen (NORCO/VICODIN) 5-325 MG per tablet Take 1 tablet by mouth every 6 (six) hours as needed for moderate pain.  . [DISCONTINUED] mometasone (NASONEX) 50 MCG/ACT nasal spray Place 2 sprays into the nose daily.  . [DISCONTINUED] silver sulfADIAZINE (SILVADENE) 1 % cream Apply 1 application topically daily.    No Known Allergies   Current Medications, Allergies, Past Medical History, Past Surgical History, Family History, and Social History were reviewed in [employer] record.    Review of Systems         See HPI - all other systems neg except as noted... The patient complains of dyspnea on exertion & gait abn.  The patient denies anorexia, fever, weight loss, weight gain, vision loss, decreased hearing, hoarseness, chest pain, syncope, worsening edema, prolonged cough, headaches, hemoptysis, abdominal pain, melena, hematochezia, severe indigestion/heartburn, hematuria, incontinence, muscle weakness, suspicious skin lesions, transient blindness, depression, unusual weight change, abnormal bleeding, enlarged lymph nodes, and angioedema.     Objective:   Physical Exam      WD, Overweight, 73 y/o BF in  NAD... GENERAL:  Alert & oriented; pleasant & cooperative... HEENT:  [place]/AT, EOM-wnl, PERRLA, EACs-clear, TMs-wnl, NOSE-clear, THROAT-clear & wnl, no lesions seen; VOICE- sl hoarse. NECK:  Supple w/ decrROM; no JVD; normal carotid impulses w/o bruits; no thyromegaly or nodules palpated; no lymphadenopathy. CHEST:  Few scat rhonchi, mild congestion & exp wheezing, no consolidation... HEART:  Regular Rhythm, gr 1/6 SEM without rubs or gallops... ABDOMEN:  Obese, soft & nontender; normal bowel sounds; no organomegaly or masses detected. EXT:  s/p right TKR, mod  arthritic changes; +varicose veins (s/p laser ablation left GSV 4/13)/ +venous insuffic/ 1+ edema... NEURO:  CN's intact; no focal neuro deficits... DERM:  No lesions noted; no rash etc...  RADIOLOGY DATA:  Reviewed in the EPIC EMR & discussed w/ the patient...  LABORATORY DATA:  Reviewed in the EPIC EMR & discussed w/ the patient...   Assessment & Plan:    Hx Asthma/ COPD/ AB, smoker (?ex), & hx pos PPD- treated> She's had refractory "attack" & treated in past w/ Levaquin 500mg /d, Medrol dosepak, Mucinex 2Bid, MMW, etc; improved off cigs & on Symbicort...  HBP>  BP controlled on diet + Lasix Rx...  CAD>  Denies CP, angina, etc;  On ASA, statin, off cigs, etc...  Ven Insuffic>  On sodium restriction, elevation, support hose; also on LASIX to 40mg /d & followed by [facility] & s/p laser ablation of left GSV- improved.  CHOL>  Back on Prav40 & FLP is stable...  OBESITY>  Needs better diet & hopefully incr exerc after the knee surg...  GI>  GERD, Polyps, Gallstone> follwed by DrStark & stable on Omep20 but uses prn only...  S/P left TKR 7/12 by DrBeane>  Course complic by bleed into left calf; now stable & improving w/ PT etc...  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 She will need Ortho referral for further  eval...  Other medical issues as noted...   Patient's Medications  New Prescriptions   No medications on file  Previous Medications   ALBUTEROL (PROVENTIL HFA;VENTOLIN HFA) 108 (90 BASE) MCG/ACT INHALER    Inhale 2 puffs into the lungs every 4 (four) hours as needed. For wheeze or shortness of breath   ASPIRIN 81 MG TABLET    Take 81 mg by mouth daily.     AZELASTINE (ASTELIN) 0.1 % NASAL SPRAY    Place 1 spray into both nostrils 2 (two) times daily.    BUDESONIDE-FORMOTEROL (SYMBICORT) 160-4.5 MCG/ACT INHALER    Inhale 2 puffs into the lungs 2 (two) times daily.   EPIPEN 2-PAK 0.3 MG/0.3ML SOAJ INJECTION       FERROUS SULFATE 325 (65 FE) MG TABLET    Take 325 mg by mouth daily with breakfast.   FLUTICASONE (FLONASE) 50 MCG/ACT NASAL SPRAY    Use as needed   FUROSEMIDE (LASIX) 40 MG TABLET    TAKE 1 TABLET (40 MG TOTAL) BY MOUTH DAILY.   HYDROCODONE-ACETAMINOPHEN (NORCO/VICODIN) 5-325 MG PER TABLET    Take 1 tablet by mouth TID prn for pain   MULTIPLE VITAMIN (MULTIVITAMIN) CAPSULE    Take 1 capsule by mouth daily.     OMEPRAZOLE (PRILOSEC) 20 MG CAPSULE    Take 20 mg by mouth daily.   POTASSIUM CHLORIDE SA (KLOR-CON M20) 20 MEQ TABLET    TAKE 1 TABLET (20 MEQ TOTAL) BY MOUTH DAILY.   PRAVASTATIN (PRAVACHOL) 40 MG TABLET    TAKE 1 TABLET (40 MG TOTAL) BY MOUTH DAILY.   TRAZODONE (DESYREL) 100 MG TABLET    TAKE 1 TABLET BY MOUTH AT BEDTIME  Modified Medications   No medications on file  Discontinued Medications   HYDROCODONE-ACETAMINOPHEN (NORCO/VICODIN) 5-325 MG PER TABLET    Take 1 tablet by mouth every 6 (six) hours as needed for moderate pain.   MOMETASONE (NASONEX) 50 MCG/ACT NASAL SPRAY    Place 2 sprays into the nose daily.   SILVER  SULFADIAZINE (SILVADENE) 1 % CREAM    Apply 1 application topically daily.

## 2014-09-01 ENCOUNTER — Ambulatory Visit (INDEPENDENT_AMBULATORY_CARE_PROVIDER_SITE_OTHER): Payer: Medicare HMO | Admitting: Podiatrist

## 2014-09-01 ENCOUNTER — Encounter: Payer: Self-pay | Admitting: Podiatrist

## 2014-09-01 ENCOUNTER — Telehealth: Payer: Self-pay | Admitting: Pulmonary Disease

## 2014-09-01 VITALS — BP 134/82 | HR 50 | Resp 16

## 2014-09-01 DIAGNOSIS — M71572 Other bursitis, not elsewhere classified, left ankle and foot: Secondary | ICD-10-CM | POA: Diagnosis not present

## 2014-09-01 DIAGNOSIS — L97521 Non-pressure chronic ulcer of other part of left foot limited to breakdown of skin: Secondary | ICD-10-CM | POA: Diagnosis not present

## 2014-09-01 MED ORDER — TRIAMCINOLONE ACETONIDE 10 MG/ML IJ SUSP
10.0000 mg | Freq: Once | INTRAMUSCULAR | Status: AC
  Administered 2014-09-01: 10 mg

## 2014-09-01 MED ORDER — PRAVASTATIN SODIUM 40 MG PO TABS: ORAL_TABLET | ORAL | Status: DC

## 2014-09-01 NOTE — Telephone Encounter (Signed)
Called and lmom to make the pt aware that the rx has been resent to the pharmacy.

## 2014-09-01 NOTE — Telephone Encounter (Signed)
Pt calling again a/b medication refill says that she is @ the drug store and thry say they don't have it, please advise.[name]

## 2014-09-01 NOTE — Progress Notes (Signed)
Patient presents today for followup of ulceration between the left first and second toes. She has been hurting on the bottom of the foot more than the interspace that had been ulcerated in the past.   Patient is awake, alert, and oriented x 3. In no acute distress. Vascular status is intact with palpable pedal pulses at 1/4 DP and PT bilateral and capillary refill time within normal limits. Neurological sensation is also intact bilaterally.. the patient's hallux is laterally deviated against the left second toe which is shortened due to her previous surgery.. There is very minimal space between the 2 digits and there's crowding between the 2 digits.  A healed ulcer/ hard lesion is present on the medial aspect of the stump of the 2nd toe from the pressure from the great toe. Mild swelling at the second interspace is present with associated pain.  Assessment: hyperkeratotic lesion /macerated tissue first interspace left foot,  Bursitis between first and second toes.  Plan: injected the first interspace with kenalog and marcaine plain.  Debrided the tissue and applied silicone pad on the great toe. Recommend she continue to do the same. Also recommended continued use of castellani's paint at home as she has completely cleared the macerated tissue up and we want her to continue this. She will be seen back in 4 weeks for recheck and debridement.

## 2014-09-01 NOTE — Telephone Encounter (Signed)
Spoke with pt--states that Crestor was not sent in after 08/31/14 visit with SN Pravastatin 40mg  was sent to pharmacy. I do not see Crestor on pt med list nor is there a history of [place] sending this medication recently.  Notes look as though pt Crestor was changed to Pravastatin.Marland KitchenMarland KitchenPt was adamant that the Crestor was supposed to be sent as discussed during OV??  Please advise Dr Lenna Gilford. Thanks.

## 2014-09-04 ENCOUNTER — Telehealth: Payer: Self-pay | Admitting: Pulmonary Disease

## 2014-09-04 NOTE — Telephone Encounter (Signed)
Spoke with pt and advise that rx for Pravastatin was setnt to pharmacy.  Pt verbalized understanding.

## 2014-09-04 NOTE — Telephone Encounter (Signed)
Per SN---  Her pravastatin 40 is working well.  If she wants to change to crestor 10 it will cost her a lot more.  But this is up to her.  thanks

## 2014-09-17 ENCOUNTER — Other Ambulatory Visit: Payer: Self-pay | Admitting: Pulmonary Disease

## 2014-09-22 ENCOUNTER — Ambulatory Visit (INDEPENDENT_AMBULATORY_CARE_PROVIDER_SITE_OTHER): Payer: Medicare HMO | Admitting: Podiatry

## 2014-09-22 ENCOUNTER — Ambulatory Visit (INDEPENDENT_AMBULATORY_CARE_PROVIDER_SITE_OTHER): Payer: Medicare HMO

## 2014-09-22 ENCOUNTER — Ambulatory Visit: Payer: Medicare HMO | Admitting: Podiatrist

## 2014-09-22 ENCOUNTER — Encounter: Payer: Self-pay | Admitting: Podiatry

## 2014-09-22 VITALS — BP 134/77 | HR 75 | Resp 16

## 2014-09-22 DIAGNOSIS — L97521 Non-pressure chronic ulcer of other part of left foot limited to breakdown of skin: Secondary | ICD-10-CM

## 2014-09-22 DIAGNOSIS — L988 Other specified disorders of the skin and subcutaneous tissue: Secondary | ICD-10-CM

## 2014-09-22 DIAGNOSIS — M779 Enthesopathy, unspecified: Secondary | ICD-10-CM

## 2014-09-25 NOTE — Progress Notes (Signed)
Patient ID: [name], female   DOB: March 14, 1941, 73 y.o.   MRN: [id number]  Subjective: Returns here for follow-up evaluation of pain between her first and second toes. She states that she has recently had increasing pain between her toes and has history of ulceration in the area. She denies any recent opening or any drainage. She has been using Castellani's paint at home due to maceration in the area. She denies any systemic complaints at his ears, chills, nausea, vomiting. No other complaints at this time.  Objective: AAO 3, NAD DP/PT pulses palpable bilaterally, CRT less than 3 seconds protective sensation intact with Simms Weinstein monofilament There is lateral deviation of the left hallux abutting the second digit H is significantly shortened from prior surgery. There is a prominent area on the medial aspect of the second digit which is abutting the hallux. There is an area of macerated tissue/superficial ulceration on the corresponding lateral hallux and medial second toe in the sulcus. There is no drainage, erythema, edema, increased warmth, ascending cellulitis. There is no probing, tunneling, or undermining. No further lesions identified.  Assessment: Superficial ulceration and maceration in the first interspace left foot.  Plan: -X-rays were obtained and reviewed with the patient. -Conservative versus surgical treatment discussed including alternatives, risks, complications. -Lesions sharply debrided without complications. -Daily dressing changes and local wound care. Continue to recommend Castellani's paint to help clear the macerated tissue. Also recommended applying a dressing interdigitally to help separate the digits into to the superficial wound. Monitoring clinical signs or symptoms of infection and directed to call the office immediately are to occur or directly to the emergency room. -Lower up in 1 week or sooner if any problems are to arise or any change in symptoms. In the  meantime call the office with any questions, concerns.

## 2014-09-27 ENCOUNTER — Telehealth: Payer: Self-pay | Admitting: Pulmonary Disease

## 2014-09-27 NOTE — Telephone Encounter (Signed)
Received in home risk assessment patient summary sheet from [facility], 3 pages, sent to Dr. Lenna Gilford. 09/27/14/ss.

## 2014-10-06 ENCOUNTER — Ambulatory Visit (INDEPENDENT_AMBULATORY_CARE_PROVIDER_SITE_OTHER): Payer: Medicare HMO | Admitting: Podiatrist

## 2014-10-06 ENCOUNTER — Encounter: Payer: Self-pay | Admitting: Podiatrist

## 2014-10-06 VITALS — BP 139/79 | HR 60 | Resp 16

## 2014-10-06 DIAGNOSIS — I872 Venous insufficiency (chronic) (peripheral): Secondary | ICD-10-CM

## 2014-10-06 DIAGNOSIS — L97521 Non-pressure chronic ulcer of other part of left foot limited to breakdown of skin: Secondary | ICD-10-CM

## 2014-10-06 DIAGNOSIS — L988 Other specified disorders of the skin and subcutaneous tissue: Secondary | ICD-10-CM

## 2014-10-06 NOTE — Progress Notes (Signed)
Patient presents today for followup of ulceration between the left first and second toes. She has been hurting on the bottom of the foot more than the interspace that had been ulcerated in the past.   Patient is awake, alert, and oriented x 3. In no acute distress. Vascular status is intact with palpable pedal pulses at 1/4 DP and PT bilateral and capillary refill time within normal limits. Neurological sensation is also intact bilaterally.. the patient's hallux is laterally deviated against the left second toe which is shortened due to her previous surgery.. There is very minimal space between the 2 digits and there's crowding between the 2 digits.  A healed ulcer/ hard lesion is present on the medial aspect of the stump of the 2nd toe from the pressure from the great toe. Mild swelling at the second interspace is present with associated pain plantarly at the second metatarsal head as well.  Assessment: hyperkeratotic lesion /macerated tissue first interspace left foot, Bursitis between first and second toes.  Plan: . Debrided the tissue and applied silicone pad on the great toe. Recommend she continue to do the same. Also recommended continued use of castellani's paint at home as she has completely cleared the macerated tissue up and we want her to continue this. She will be seen back in 2 weeks for recheck and debridement. We also discussed removal of the second toe stub in the future if she continues to have pain. She will consider her options and let me know.

## 2014-10-09 ENCOUNTER — Telehealth: Payer: Self-pay | Admitting: Pulmonary Disease

## 2014-10-09 NOTE — Telephone Encounter (Signed)
I called spoke with pt. She will try the OTC recs 1st. Nothing further needed

## 2014-10-09 NOTE — Telephone Encounter (Signed)
Per SN---  Nothing OTC is 100% Try magnesium supplement Tonic water 1 glass at bedtime  rx if she wants-- zanaflex 4 mg  #30  1 po TID prn muscle spasms

## 2014-10-09 NOTE — Telephone Encounter (Signed)
Pt c/o cramps in right leg at thigh and in left leg at calf/knee Would like to know if there is something she can take OTC for this . Pt currently takes Potassium 41meq QD   Please advise Dr Lenna Gilford. Thanks.

## 2014-10-25 ENCOUNTER — Encounter: Payer: Self-pay | Admitting: Podiatrist

## 2014-10-25 ENCOUNTER — Ambulatory Visit: Payer: Medicare HMO | Admitting: Podiatrist

## 2014-10-25 ENCOUNTER — Ambulatory Visit (INDEPENDENT_AMBULATORY_CARE_PROVIDER_SITE_OTHER): Payer: Medicare HMO | Admitting: Podiatrist

## 2014-10-25 VITALS — BP 121/70 | HR 70 | Resp 16

## 2014-10-25 DIAGNOSIS — L97521 Non-pressure chronic ulcer of other part of left foot limited to breakdown of skin: Secondary | ICD-10-CM

## 2014-11-06 NOTE — Progress Notes (Signed)
Patient presents today for followup of ulceration between the left first and second toes. She has been hurting on the bottom of the foot more than the interspace that had been ulcerated in the past. She states the foot is comfortable for about 10 days then the pain returns.  She is using the castellani's paint  Patient is awake, alert, and oriented x 3. In no acute distress. Vascular status is intact with palpable pedal pulses at 1/4 DP and PT bilateral and capillary refill time within normal limits. Neurological sensation is also intact bilaterally.. the patient's hallux is laterally deviated against the left second toe which is shortened due to her previous surgery.. There is very minimal space between the 2 digits and there's crowding between the 2 digits.  A  hard lesion is present on the medial aspect of the stump of the 2nd toe from the pressure from the great toe. Mild swelling at the second interspace is present with associated pain plantarly at the second metatarsal head as well.  Assessment: hyperkeratotic lesion /macerated tissue first interspace left foot, Bursitis between first and second toes.  Plan: . Debrided the tissue and applied tubefoam to try and add space. Recommend she continue to do the same. Also recommended continued use of castellani's paint at home as she has completely cleared the macerated tissue up and we want her to continue this. She will be seen back in 2 weeks for recheck and debridement. We also discussed removal of the second toe stub in the future if she continues to have pain. She will consider her options and let me know.

## 2014-11-10 ENCOUNTER — Ambulatory Visit: Payer: Medicare HMO | Admitting: Podiatrist

## 2014-11-17 ENCOUNTER — Encounter: Payer: Self-pay | Admitting: Podiatrist

## 2014-11-17 ENCOUNTER — Ambulatory Visit (INDEPENDENT_AMBULATORY_CARE_PROVIDER_SITE_OTHER): Payer: BC Managed Care – PPO | Admitting: Podiatrist

## 2014-11-17 DIAGNOSIS — L97521 Non-pressure chronic ulcer of other part of left foot limited to breakdown of skin: Secondary | ICD-10-CM

## 2014-11-20 NOTE — Progress Notes (Signed)
Patient presents today for followup of ulceration between the left first and second toes. She has been hurting on the bottom of the foot more than the interspace that had been ulcerated in the past. She states the foot is comfortable for about 10 days then the pain returns.  She is using the castellani's paint  Patient is awake, alert, and oriented x 3. In no acute distress. Vascular status is intact with palpable pedal pulses at 1/4 DP and PT bilateral and capillary refill time within normal limits. Neurological sensation is also intact bilaterally.. the patient's hallux is laterally deviated against the left second toe which is shortened due to her previous surgery.. There is very minimal space between the 2 digits and there's crowding between the 2 digits.  A  hard lesion is present on the medial aspect of the stump of the 2nd toe from the pressure from the great toe. Mild swelling at the second interspace is present with associated pain plantarly at the second metatarsal head as well.  Assessment: hyperkeratotic lesion /macerated tissue first interspace left foot, Bursitis between first and second toes.  Plan: . Debrided the tissue and applied tubefoam to try and add space. Recommend she continue to do the same. Also recommended continued use of castellani's paint at home as she has completely cleared the macerated tissue up and we want her to continue this. She will be seen back in 2 weeks for recheck and debridement. We again also discussed removal of the second toe stub in the future if she continues to have pain.  Will discuss further at the next visit as I do not believe the toe will improve without the surgery.

## 2014-11-27 ENCOUNTER — Telehealth: Payer: Self-pay | Admitting: Pulmonary Disease

## 2014-11-27 MED ORDER — AZITHROMYCIN 250 MG PO TABS: ORAL_TABLET | ORAL | Status: DC

## 2014-11-27 MED ORDER — PREDNISONE (PAK) 5 MG PO TABS: ORAL_TABLET | ORAL | Status: DC

## 2014-11-27 NOTE — Telephone Encounter (Signed)
Correction: SN is available.  Will route to Dr Lenna Gilford.  Please advise, thank you.

## 2014-11-27 NOTE — Telephone Encounter (Signed)
Per SN: okay for zpak and prednisone dosepak 5mg  6day pak.  Tylenol, mucinex, fluids, delsym prn.  Thanks.  Called spoke with patient, advised of SN's recs as stated above.  Pt voiced her understanding and denied any questions/concerns at this time.  She will call back if symptoms do not improve or they worsen.  Rx's sent to verified pharmacy Nothing further needed; will sign off.

## 2014-11-27 NOTE — Telephone Encounter (Signed)
Called spoke with patient who c/o prod cough with white mucus, wheezing, some tightness, body aches x5 days.  Denies any increased dysnea, f/c/s, hemoptysis, n/v/d.  Has been taking Tylenol/Aleve, Tussin DM, Sudafed cold/sinus - no real relief from these.  [facility] - verified  Last ov w/ SN 10.1.15 SN not available this morning, will forward to doc-of-the-morning Dr Melvyn Novas please advise, thank you.

## 2014-12-08 ENCOUNTER — Ambulatory Visit: Payer: Medicare HMO | Admitting: Podiatrist

## 2014-12-15 ENCOUNTER — Encounter: Payer: Self-pay | Admitting: Podiatrist

## 2014-12-15 ENCOUNTER — Ambulatory Visit (INDEPENDENT_AMBULATORY_CARE_PROVIDER_SITE_OTHER): Payer: Medicare HMO | Admitting: Podiatrist

## 2014-12-15 VITALS — BP 190/97 | HR 61 | Resp 16

## 2014-12-15 DIAGNOSIS — L97509 Non-pressure chronic ulcer of other part of unspecified foot with unspecified severity: Secondary | ICD-10-CM | POA: Diagnosis not present

## 2014-12-15 DIAGNOSIS — B351 Tinea unguium: Secondary | ICD-10-CM

## 2014-12-15 DIAGNOSIS — M79673 Pain in unspecified foot: Secondary | ICD-10-CM

## 2014-12-15 DIAGNOSIS — L988 Other specified disorders of the skin and subcutaneous tissue: Secondary | ICD-10-CM

## 2014-12-15 DIAGNOSIS — L97521 Non-pressure chronic ulcer of other part of left foot limited to breakdown of skin: Secondary | ICD-10-CM

## 2014-12-15 NOTE — Progress Notes (Signed)
Chief Complaint  Patient presents with  . Foot Ulcer    Follow up between 1st and 2nd toes left     . Debridement    Requesting toenail trim today     Patient presents today for followup of ulceration between the left first and second toes. She is also requeting her toenails be trimmed today as well. She has painful toenails 1-4 bilateral.  She has been hurting on the bottom of the foot more than the interspace that had been ulcerated in the past. She states the foot is comfortable for about 10 days then the pain returns.  She is using the castellani's paint  Patient is awake, alert, and oriented x 3. In no acute distress. Vascular status is intact with palpable pedal pulses at 1/4 DP and PT bilateral and capillary refill time within normal limits. Neurological sensation is also intact bilaterally.. the patient's hallux is laterally deviated against the left second toe which is shortened due to her previous surgery.. There is very minimal space between the 2 digits and there's crowding between the 2 digits.  A  hard lesion is present on the medial aspect of the stump of the 2nd toe from the pressure from the great toe. Mild swelling at the second interspace is present with associated pain plantarly at the second metatarsal head as well. Toenails 1, 2, 3, 4 bilateral are thickened, discolored, dystrophic, symptomatic and clinically mycotic. They're pain with pressure and with shoe gear.  Assessment: hyperkeratotic lesion /macerated tissue first interspace left foot, Bursitis between first and second toes. Symptomatic mycotic toenails  Plan:  Debridement of mycotic toenails was accomplished. Debrided the tissue and applied tubefoam to try and add space. Recommend she continue to do the same. Also recommended continued use of castellani's paint at home as she has completely cleared the macerated tissue up and we want her to continue this. She will be seen back in 2 weeks for recheck and debridement. We  again also discussed removal of the second toe stub in the future if she continues to have pain.  Will discuss further at the next visit as I do not believe the toe will improve without the surgery.

## 2014-12-16 NOTE — Progress Notes (Signed)
HPI: FU coronary disease. Cardiac catheterization 2/05 showed mild irregularities in the left main. There was a 40% stenosis in the proximal LAD and a distal 30% lesion. The first diagonal had a 30-40% proximal lesion. The ostium of the LAD by intravascular ultrasound had a luminal area of 3.9 mm squared and percent stenosis 60%. She had nonobstructive disease in the left circumflex and right coronary. Her LV function was normal. Her last Myoview was in Dec 2010 and showed no significant abnormality and an ejection fraction was 64%. An echocardiogram was also performed in December of 2010 and revealed normal LV function and mild left atrial enlargement. Since I last saw her, she has some dyspnea on exertion but no orthopnea or PND. No chest pain.  Current Outpatient Prescriptions  Medication Sig Dispense Refill  . albuterol (PROVENTIL HFA;VENTOLIN HFA) 108 (90 BASE) MCG/ACT inhaler Inhale 2 puffs into the lungs every 4 (four) hours as needed. For wheeze or shortness of breath 1 Inhaler 6  . aspirin 81 MG tablet Take 81 mg by mouth daily.      . budesonide-formoterol (SYMBICORT) 160-4.5 MCG/ACT inhaler Inhale 2 puffs into the lungs 2 (two) times daily. 1 Inhaler 5  . EPIPEN 2-PAK 0.3 MG/0.3ML SOAJ injection     . ferrous sulfate 325 (65 FE) MG tablet Take 325 mg by mouth daily with breakfast.    . fluticasone (FLONASE) 50 MCG/ACT nasal spray Use as needed    . furosemide (LASIX) 40 MG tablet TAKE 1 TABLET (40 MG TOTAL) BY MOUTH DAILY. 90 tablet 0  . HYDROcodone-acetaminophen (NORCO/VICODIN) 5-325 MG per tablet Take 1 tablet by mouth TID prn for pain 90 tablet 0  . KLOR-CON M20 20 MEQ tablet TAKE 1 TABLET (20 MEQ TOTAL) BY MOUTH DAILY. 30 tablet 4  . Multiple Vitamin (MULTIVITAMIN) capsule Take 1 capsule by mouth daily.      Marland Kitchen omeprazole (PRILOSEC) 20 MG capsule Take 20 mg by mouth daily.    . pravastatin (PRAVACHOL) 40 MG tablet TAKE 1 TABLET (40 MG TOTAL) BY MOUTH DAILY. 90 tablet 3  .  traZODone (DESYREL) 100 MG tablet TAKE 1 TABLET BY MOUTH AT BEDTIME 30 tablet 5   No current facility-administered medications for this visit.     Past Medical History  Diagnosis Date  . Allergic rhinitis   . Positive PPD   . COPD (chronic obstructive pulmonary disease)   . Hypertension   . CAD (coronary artery disease)   . Venous insufficiency   . Hypercholesteremia   . Obesity   . GERD (gastroesophageal reflux disease)   . Hx of colonic polyps   . DJD (degenerative joint disease)   . Arnold-Chiari malformation   . History of absence seizures   . Reflex sympathetic dystrophy   . Varicose veins   . Asthma     Past Surgical History  Procedure Laterality Date  . Abdominal hysterectomy    . Cspine surgery  1993    for arnold-chiari malformation  . Right knee arthroscopy  12/2007    Dr. Tonita Cong  . 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 Tonita Cong  . Joint replacement  06/05/11    Left total knee replacement    History   Social History  . Marital Status: Widowed    Spouse Name: N/A    Number of Children: 2  . Years of Education: N/A   Occupational History  . retired    Social History Main Topics  .  Smoking status: Former Smoker -- 0.30 packs/day for 12 years    Types: Cigarettes    Quit date: 01/02/2012  . Smokeless tobacco: Never Used     Comment: 1 pack per week  . Alcohol Use: No  . Drug Use: No  . Sexual Activity: Not on file   Other Topics Concern  . Not on file   Social History Narrative    ROS: no fevers or chills, productive cough, hemoptysis, dysphasia, odynophagia, melena, hematochezia, dysuria, hematuria, rash, seizure activity, orthopnea, PND, pedal edema, claudication. Remaining systems are negative.  Physical Exam: Well-developed obese in no acute distress.  Skin is warm and dry.  HEENT is normal.  Neck is supple.  Chest is clear to auscultation with normal expansion.  Cardiovascular exam is regular rate and rhythm.  Abdominal exam  nontender or distended. No masses palpated. Extremities show trace edema. neuro grossly intact  ECG marked sinus bradycardia at a rate of 49. Nonspecific ST changes.

## 2014-12-18 ENCOUNTER — Ambulatory Visit (INDEPENDENT_AMBULATORY_CARE_PROVIDER_SITE_OTHER): Payer: BLUE CROSS/BLUE SHIELD | Admitting: Cardiology

## 2014-12-18 ENCOUNTER — Encounter: Payer: Self-pay | Admitting: Cardiology

## 2014-12-18 ENCOUNTER — Telehealth: Payer: Self-pay | Admitting: Pulmonary Disease

## 2014-12-18 VITALS — BP 128/78 | HR 49 | Ht 65.0 in | Wt 227.4 lb

## 2014-12-18 DIAGNOSIS — I2510 Atherosclerotic heart disease of native coronary artery without angina pectoris: Secondary | ICD-10-CM

## 2014-12-18 DIAGNOSIS — E7800 Pure hypercholesterolemia, unspecified: Secondary | ICD-10-CM

## 2014-12-18 DIAGNOSIS — I10 Essential (primary) hypertension: Secondary | ICD-10-CM

## 2014-12-18 MED ORDER — HYDROCODONE-ACETAMINOPHEN 5-325 MG PO TABS: ORAL_TABLET | ORAL | Status: DC

## 2014-12-18 NOTE — Telephone Encounter (Signed)
Called and spoke with pt and she is aware of rx that has been left up front and she will come by and pick this up today.

## 2014-12-18 NOTE — Telephone Encounter (Signed)
Pt returned call. Pt is requesting refill for Vicodin, 1 tab TID prn, for arthritic pain in BIL knees and right elbow.  Last filled on 08/31/2014 for #90 with 0 refills.  Pt last seen on 08/31/2014 by SN.   Dr. Lenna Gilford please advise.   No Known Allergies   Current Outpatient Prescriptions on File Prior to Visit  Medication Sig Dispense Refill  . albuterol (PROVENTIL HFA;VENTOLIN HFA) 108 (90 BASE) MCG/ACT inhaler Inhale 2 puffs into the lungs every 4 (four) hours as needed. For wheeze or shortness of breath 1 Inhaler 6  . aspirin 81 MG tablet Take 81 mg by mouth daily.      . budesonide-formoterol (SYMBICORT) 160-4.5 MCG/ACT inhaler Inhale 2 puffs into the lungs 2 (two) times daily. 1 Inhaler 5  . EPIPEN 2-PAK 0.3 MG/0.3ML SOAJ injection     . ferrous sulfate 325 (65 FE) MG tablet Take 325 mg by mouth daily with breakfast.    . fluticasone (FLONASE) 50 MCG/ACT nasal spray Use as needed    . furosemide (LASIX) 40 MG tablet TAKE 1 TABLET (40 MG TOTAL) BY MOUTH DAILY. 90 tablet 0  . HYDROcodone-acetaminophen (NORCO/VICODIN) 5-325 MG per tablet Take 1 tablet by mouth TID prn for pain 90 tablet 0  . KLOR-CON M20 20 MEQ tablet TAKE 1 TABLET (20 MEQ TOTAL) BY MOUTH DAILY. 30 tablet 4  . Multiple Vitamin (MULTIVITAMIN) capsule Take 1 capsule by mouth daily.      Marland Kitchen omeprazole (PRILOSEC) 20 MG capsule Take 20 mg by mouth daily.    . pravastatin (PRAVACHOL) 40 MG tablet TAKE 1 TABLET (40 MG TOTAL) BY MOUTH DAILY. 90 tablet 3  . traZODone (DESYREL) 100 MG tablet TAKE 1 TABLET BY MOUTH AT BEDTIME 30 tablet 5   No current facility-administered medications on file prior to visit.

## 2014-12-18 NOTE — Assessment & Plan Note (Signed)
Continue statin. 

## 2014-12-18 NOTE — Telephone Encounter (Signed)
LM with husband to return call.  

## 2014-12-18 NOTE — Assessment & Plan Note (Signed)
Continue aspirin and statin. Schedule nuclear study for risk stratification. 

## 2014-12-18 NOTE — Patient Instructions (Signed)
Your physician wants you to follow-up in: [place] will receive a reminder letter in the mail two months in advance. If you don't receive a letter, please call our office to schedule the follow-up appointment.   Your physician has requested that you have a lexiscan myoview. For further information please visit [email or web address]. Please follow instruction sheet, as given.  TAKE ALL MEDICINE

## 2014-12-18 NOTE — Telephone Encounter (Signed)
Per SN--  Ok to refill medication.  rx has been printed out and will call the pt once this is ready to be picked up.

## 2014-12-18 NOTE — Assessment & Plan Note (Signed)
Blood pressure controlled. Continue present medications. 

## 2014-12-26 ENCOUNTER — Telehealth (HOSPITAL_COMMUNITY): Payer: Self-pay

## 2014-12-26 NOTE — Telephone Encounter (Signed)
Encounter complete. 

## 2014-12-27 ENCOUNTER — Telehealth (HOSPITAL_COMMUNITY): Payer: Self-pay

## 2014-12-27 NOTE — Telephone Encounter (Signed)
Encounter complete. 

## 2014-12-28 ENCOUNTER — Ambulatory Visit (HOSPITAL_COMMUNITY)
Admission: RE | Admit: 2014-12-28 | Discharge: 2014-12-28 | Disposition: A | Discharge status: Home / Self Care | Payer: BLUE CROSS/BLUE SHIELD | Source: Ambulatory Visit | Attending: Cardiovascular Disease | Admitting: Cardiovascular Disease

## 2014-12-28 DIAGNOSIS — Z8249 Family history of ischemic heart disease and other diseases of the circulatory system: Secondary | ICD-10-CM | POA: Diagnosis not present

## 2014-12-28 DIAGNOSIS — Z720 Tobacco use: Secondary | ICD-10-CM | POA: Insufficient documentation

## 2014-12-28 DIAGNOSIS — I2510 Atherosclerotic heart disease of native coronary artery without angina pectoris: Secondary | ICD-10-CM

## 2014-12-28 DIAGNOSIS — R079 Chest pain, unspecified: Secondary | ICD-10-CM | POA: Insufficient documentation

## 2014-12-28 DIAGNOSIS — E785 Hyperlipidemia, unspecified: Secondary | ICD-10-CM | POA: Insufficient documentation

## 2014-12-28 DIAGNOSIS — E669 Obesity, unspecified: Secondary | ICD-10-CM | POA: Diagnosis not present

## 2014-12-28 DIAGNOSIS — R5383 Other fatigue: Secondary | ICD-10-CM | POA: Diagnosis not present

## 2014-12-28 DIAGNOSIS — I10 Essential (primary) hypertension: Secondary | ICD-10-CM | POA: Diagnosis not present

## 2014-12-28 DIAGNOSIS — M79601 Pain in right arm: Secondary | ICD-10-CM | POA: Insufficient documentation

## 2014-12-28 DIAGNOSIS — R0609 Other forms of dyspnea: Secondary | ICD-10-CM | POA: Diagnosis not present

## 2014-12-28 MED ORDER — TECHNETIUM TC 99M SESTAMIBI GENERIC - CARDIOLITE
32.4000 | Freq: Once | INTRAVENOUS | Status: AC | PRN
  Administered 2014-12-28: 32 via INTRAVENOUS

## 2014-12-28 MED ORDER — TECHNETIUM TC 99M SESTAMIBI GENERIC - CARDIOLITE
10.5000 | Freq: Once | INTRAVENOUS | Status: AC | PRN
  Administered 2014-12-28: 11 via INTRAVENOUS

## 2014-12-28 MED ORDER — REGADENOSON 0.4 MG/5ML IV SOLN
0.4000 mg | Freq: Once | INTRAVENOUS | Status: AC
  Administered 2014-12-28: 0.4 mg via INTRAVENOUS

## 2014-12-28 NOTE — Procedures (Addendum)
[facility] [address] [place] [address] [phone]  Cardiology Nuclear Med Study  [name] is a 74 y.o. female     MRN : [id number]     DOB: 1940/12/16  Procedure Date: 12/28/2014  Nuclear Med Background Indication for Stress Test:  Follow up CAD;Symptomatic History:  COPD and Venous Insufficiency;Abnormal heart rate;Last NUC MPI in 10/2009-normal;EF=64% Cardiac Risk Factors: Family History - CAD, Hypertension, Lipids, Obesity and Smoker  Symptoms:  Chest Pain, DOE, Fatigue and Pain in right arm.   Nuclear Pre-Procedure Caffeine/Decaff Intake:  9:00pm NPO After: 7:00am   IV Site: R Forearm  IV 0.9% NS with Angio Cath:  22g  Chest Size (in):  n/a IV Started by: Rolene Course, RN  Height: 5\' 5"  (1.651 m)  Cup Size: C  BMI:  Body mass index is 37.77 kg/(m^2). Weight:  227 lb (102.967 kg)   Tech Comments:  n/a    Nuclear Med Study 1 or 2 day study: 1 day  Stress Test Type:  [place] Provider:  Kirk Ruths, MD   Resting Radionuclide: Technetium 61m Sestamibi  Resting Radionuclide Dose: 10.5 mCi   Stress Radionuclide:  Technetium 17m Sestamibi  Stress Radionuclide Dose: 32.4 mCi           Stress Protocol Rest HR: 44 Stress HR: 55  Rest BP: 119/100 Stress BP: 140/103  Exercise Time (min): n/a METS: n/a   Predicted Max HR: 149 bpm % Max HR: 49.66 bpm Rate Pressure Product: 10878  Dose of Adenosine (mg):  n/a Dose of Lexiscan: 0.4 mg  Dose of Atropine (mg): n/a Dose of Dobutamine: n/a mcg/kg/min (at max HR)  Stress Test Technologist: Leane Para, CCT Nuclear Technologist: Imagene Riches, CNMT   Rest Procedure:  Myocardial perfusion imaging was performed at rest 45 minutes following the intravenous administration of Technetium 49m Sestamibi. Stress Procedure:  The patient received IV Lexiscan 0.4 mg over 15-seconds.  Technetium 30m Sestamibi injected IV at 30-seconds.  There were no  significant changes with Lexiscan.  Quantitative spect images were obtained after a 45 minute delay.  Transient Ischemic Dilatation (Normal <1.22):  1.30 QGS EDV:  124 ml QGS ESV:  52 ml LV Ejection Fraction: 58%     Rest ECG: NSR - Normal EKG  Stress ECG: No significant change from baseline ECG  QPS Raw Data Images:  Normal; no motion artifact; normal heart/lung ratio. Stress Images:  Normal homogeneous uptake in all areas of the myocardium. Rest Images:  Normal homogeneous uptake in all areas of the myocardium. Subtraction (SDS):  No evidence of ischemia. LV Wall Motion:  NL LV Function; NL Wall Motion  Impression Exercise Capacity:  Lexiscan with no exercise. BP Response:  Normal blood pressure response. Clinical Symptoms:  No significant symptoms noted. ECG Impression:  No significant ECG changes with Lexiscan. Comparison with Prior Nuclear Study: No images to compare   Overall Impression:  Normal stress nuclear study.   Sanda Klein, MD  12/28/2014 1:03 PM

## 2015-01-05 ENCOUNTER — Encounter: Payer: Self-pay | Admitting: Podiatrist

## 2015-01-05 ENCOUNTER — Ambulatory Visit (INDEPENDENT_AMBULATORY_CARE_PROVIDER_SITE_OTHER): Payer: Medicare HMO | Admitting: Podiatrist

## 2015-01-05 VITALS — BP 138/79 | HR 62 | Resp 12

## 2015-01-05 DIAGNOSIS — L988 Other specified disorders of the skin and subcutaneous tissue: Secondary | ICD-10-CM

## 2015-01-05 DIAGNOSIS — L84 Corns and callosities: Secondary | ICD-10-CM | POA: Diagnosis not present

## 2015-01-05 DIAGNOSIS — I872 Venous insufficiency (chronic) (peripheral): Secondary | ICD-10-CM

## 2015-01-05 DIAGNOSIS — R609 Edema, unspecified: Secondary | ICD-10-CM

## 2015-01-09 NOTE — Progress Notes (Signed)
Patient presents today for followup of ulceration between the left first and second toes. She has been hurting on the bottom of the foot more than the interspace that had been ulcerated in the past. She states the foot is comfortable for about 10 days then the pain returns.  She is using the castellani's paint and the tubefoam to keep the toes seperated.  We have talked about surgery to remove the second toe that is non functional.  She is considering the surgery as the pain is continuing to be bothersome to her.   Patient is awake, alert, and oriented x 3. In no acute distress. Vascular status is intact with palpable pedal pulses at 1/4 DP and PT bilateral and capillary refill time within normal limits. Neurological sensation is also intact bilaterally.. the patient's hallux is laterally deviated against the left second toe which is shortened due to her previous surgery.. There is very minimal space between the 2 digits and there's crowding between the 1st and 2nd digits.  A  hard lesion is present on the medial aspect of the stump of the 2nd toe from the pressure from the great toe. Mild swelling at the second interspace is present with associated pain plantarly at the second metatarsal head as well.  Assessment: hyperkeratotic lesion /macerated tissue first interspace left foot, Bursitis between first and second toes.  Plan: . I injected the interspace with dexamethasone and marcaine plain.  Then I debrided the tissue and applied tubefoam to try and add space. Recommend she continue to do the same. Also recommended continued use of castellani's paint at home as she has completely cleared the macerated tissue up and we want her to continue this. She will be seen back in 2 weeks for recheck and debridement. We again also discussed removal of the second toe stub in the future if she continues to have pain.  Will discuss further at the next visit as I do not believe the toe will improve without the surgery.

## 2015-01-16 ENCOUNTER — Other Ambulatory Visit: Payer: Self-pay | Admitting: Pulmonary Disease

## 2015-01-16 ENCOUNTER — Telehealth: Payer: Self-pay | Admitting: Pulmonary Disease

## 2015-01-16 DIAGNOSIS — M79601 Pain in right arm: Secondary | ICD-10-CM

## 2015-01-16 MED ORDER — ORPHENADRINE CITRATE ER 100 MG PO TB12: 100.0000 mg | ORAL_TABLET | Freq: Two times a day (BID) | ORAL | Status: DC

## 2015-01-16 NOTE — Telephone Encounter (Signed)
Per SN---  For leg cramps use hot baths and muscle relaxer Call in norflex(generic)100 mg  1 po bid for muscle cramps  For arm pain she will need ortho eval Refer to Dr. Tonita Cong (her ortho) to eval her arm pain.  thanks

## 2015-01-16 NOTE — Telephone Encounter (Signed)
Spoke with pt and advised of Dr Jeannine Kitten recommendations.  Rx sent to pharmacy.  Order placed for  Ortho referral.

## 2015-01-16 NOTE — Telephone Encounter (Signed)
Spoke with pt. States that she has been having R arm pain and bilateral leg cramping. Her arm has been hurting off and on for 1 month. Both legs have been cramping for 3 days. She recently had a cardiac work up and everything was fine. Would like SN's recommendations.  SN - please advise. Thanks.

## 2015-01-19 ENCOUNTER — Encounter: Payer: Self-pay | Admitting: Podiatrist

## 2015-01-19 ENCOUNTER — Ambulatory Visit: Payer: Medicare HMO

## 2015-01-19 ENCOUNTER — Ambulatory Visit (INDEPENDENT_AMBULATORY_CARE_PROVIDER_SITE_OTHER): Payer: Medicare HMO | Admitting: Podiatrist

## 2015-01-19 DIAGNOSIS — L97521 Non-pressure chronic ulcer of other part of left foot limited to breakdown of skin: Secondary | ICD-10-CM

## 2015-01-19 NOTE — Progress Notes (Signed)
Patient presents today for followup of ulceration between the left first and second toes. She has been hurting on the bottom of the foot more than the interspace that had been ulcerated in the past she relates that the foot stays uncomfortable and she doesn't have any relief and she points to the interspace between the first metatarsal and second metatarsal head.  She is using the castellani's paint and the tubefoam to keep the toes seperated.  We have talked about surgery to remove the second toe that is non functional.  She is considering the surgery as the pain is continuing to be bothersome to her.   Patient is awake, alert, and oriented x 3. In no acute distress. Vascular status is intact with palpable pedal pulses at 1/4 DP and PT bilateral and capillary refill time within normal limits. Neurological sensation is also intact bilaterally.. the patient's hallux is laterally deviated against the left second toe which is shortened due to her previous surgery.. Palpable second metatarsal head is also noted and this is where the maximal area of pain is present. There is very minimal space between the 2 digits and there's crowding between the 1st and 2nd digits.  A  hard lesion is present on the medial aspect of the stump of the 2nd toe from the pressure from the great toe. Mild swelling at the second interspace is present with associated pain plantarly at the second metatarsal head as well.  X-ray show second digit which is essentially a stump as well as an elongated second metatarsal in the area of pain against hallux.   Assessment: hyperkeratotic lesion /macerated tissue first interspace left foot, metatarsal second left foot  Plan: .Discussed conservative versus surgical options. Recommended a amputation of the second toe and metatarsal head resection of the second metatarsal. The consent form was discussed and all three pages were signed and the patient's questions were encouraged and answered to the  best of my ability. Risks of the surgery were discussed including but not limited to continued pain, infection, swelling, elevated toe, decreased range of motion,  suture or implant reaction, bleeding, decreased function, etc. Preoperative instructions were also dispensed to the patient as well as a preoperative surgical pamphlet to go along with the instructions. Surgery will be scheduled at the patients convenience and patient will be seen at [facility] specialty surgery center on outpatient basis.The patient is instructed to call if any questions or concerns arise.

## 2015-01-19 NOTE — Patient Instructions (Signed)
Pre-Operative Instructions  Congratulations, you have decided to take an important step to improving your quality of life.  You can be assured that the doctors of [facility] will be with you every step of the way.  1. Plan to be at the surgery center/hospital at least 1 (one) hour prior to your scheduled time unless otherwise directed by the surgical center/hospital staff.  You must have a responsible adult accompany you, remain during the surgery and drive you home.  Make sure you have directions to the surgical center/hospital and know how to get there on time. 2. For hospital based surgery you will need to obtain a history and physical form from your family physician within 1 month prior to the date of surgery- we will give you a form for you primary physician.  3. We make every effort to accommodate the date you request for surgery.  There are however, times where surgery dates or times have to be moved.  We will contact you as soon as possible if a change in schedule is required.   4. No Aspirin/Ibuprofen for one week before surgery.  If you are on aspirin, any non-steroidal anti-inflammatory medications (Mobic, Aleve, Ibuprofen) you should stop taking it 7 days prior to your surgery.  You make take Tylenol  For pain prior to surgery.  5. Medications- If you are taking daily heart and blood pressure medications, seizure, reflux, allergy, asthma, anxiety, pain or diabetes medications, make sure the surgery center/hospital is aware before the day of surgery so they may notify you which medications to take or avoid the day of surgery. 6. No food or drink after midnight the night before surgery unless directed otherwise by surgical center/hospital staff. 7. No alcoholic beverages 24 hours prior to surgery.  No smoking 24 hours prior to or 24 hours after surgery. 8. Wear loose pants or shorts- loose enough to fit over bandages, boots, and casts. 9. No slip on shoes, sneakers are best. 10. Bring  your boot with you to the surgery center/hospital.  Also bring crutches or a walker if your physician has prescribed it for you.  If you do not have this equipment, it will be provided for you after surgery. 11. If you have not been contracted by the surgery center/hospital by the day before your surgery, call to confirm the date and time of your surgery. 12. Leave-time from work may vary depending on the type of surgery you have.  Appropriate arrangements should be made prior to surgery with your employer. 13. Prescriptions will be provided immediately following surgery by your doctor.  Have these filled as soon as possible after surgery and take the medication as directed. 14. Remove nail polish on the operative foot. 15. Wash the night before surgery.  The night before surgery wash the foot and leg well with the antibacterial soap provided and water paying special attention to beneath the toenails and in between the toes.  Rinse thoroughly with water and dry well with a towel.  Perform this wash unless told not to do so by your physician.  Enclosed: 1 Ice pack (please put in freezer the night before surgery)   1 Hibiclens skin cleaner   Pre-op Instructions  If you have any questions regarding the instructions, do not hesitate to call our office.  [place]: [address] [place], [place] [address] [phone]  [place]: [address] [place], [place] [address] [phone]  [place]: [address]  [place], [place] [address] [phone]  Dr. Richard   Tuchman DPM, Dr. Norman Regal DPM Dr. Richard Sikora DPM, Dr. M. Todd Hyatt DPM, Dr. Danine Hor DPM 

## 2015-01-22 ENCOUNTER — Telehealth: Payer: Self-pay | Admitting: Pulmonary Disease

## 2015-01-22 NOTE — Telephone Encounter (Signed)
lmtcb X1 for pt  

## 2015-01-23 NOTE — Telephone Encounter (Signed)
lmtcb for pt.  

## 2015-01-23 NOTE — Telephone Encounter (Signed)
Pt called back - after 1:30 call [phone] if before 1:30 call [phone]

## 2015-01-23 NOTE — Telephone Encounter (Signed)
Pt states that she is scheduled to have 2nd toe on left foot removed by Dr Valentina Lucks on 02/21/15.  They are requesting surgical clearance from Dr Lenna Gilford.  Please advise.

## 2015-01-23 NOTE — Telephone Encounter (Signed)
Per SN---  Forms have been gathered for the pt to have the surgical clearance.  i have called the pt and lmom to make her aware that these papers have been mailed to Dr. Valentina Lucks.  Nothing further is needed.

## 2015-01-26 NOTE — Progress Notes (Unsigned)
Received clearance from Dr Lenna Gilford that the patient is cleared for surgery on her left foot

## 2015-01-30 ENCOUNTER — Telehealth: Payer: Self-pay | Admitting: *Deleted

## 2015-01-30 NOTE — Telephone Encounter (Signed)
Pt request note for being out of work due to surgery.

## 2015-01-31 ENCOUNTER — Telehealth: Payer: Self-pay | Admitting: Podiatry

## 2015-01-31 NOTE — Telephone Encounter (Signed)
Pt called reguarding needing a note for her job stating she is having surgery on 3/23 with Dr Jacqualyn Posey and how long she is going to be out. Please call pt back.

## 2015-01-31 NOTE — Telephone Encounter (Signed)
This is Dr. Shaune [name] patient. I have not seen her before.   If she is having surgery with me, she needs to be scheduled to come in and see me prior to surgery.

## 2015-02-01 ENCOUNTER — Encounter: Payer: Self-pay | Admitting: Podiatrist

## 2015-02-04 ENCOUNTER — Other Ambulatory Visit: Payer: Self-pay | Admitting: Pulmonary Disease

## 2015-02-05 ENCOUNTER — Telehealth: Payer: Self-pay | Admitting: Pulmonary Disease

## 2015-02-05 MED ORDER — AZITHROMYCIN 250 MG PO TABS: ORAL_TABLET | ORAL | Status: DC

## 2015-02-05 MED ORDER — FIRST-DUKES MOUTHWASH MT SUSP: OROMUCOSAL | Status: DC

## 2015-02-05 NOTE — Telephone Encounter (Signed)
lmtcb X1 for pt  

## 2015-02-05 NOTE — Telephone Encounter (Signed)
Pt c/o tickle in throat, prod cough with white mucus X2 days.  Denies fever, sob.   [facility] on [address].    Last ov: 08/31/14 Next ov: none  Dr. Lenna Gilford please advise.  Thanks!  No Known Allergies Current Outpatient Prescriptions on File Prior to Visit  Medication Sig Dispense Refill  . albuterol (PROVENTIL HFA;VENTOLIN HFA) 108 (90 BASE) MCG/ACT inhaler Inhale 2 puffs into the lungs every 4 (four) hours as needed. For wheeze or shortness of breath 1 Inhaler 6  . aspirin 81 MG tablet Take 81 mg by mouth daily.      . budesonide-formoterol (SYMBICORT) 160-4.5 MCG/ACT inhaler Inhale 2 puffs into the lungs 2 (two) times daily. 1 Inhaler 5  . EPIPEN 2-PAK 0.3 MG/0.3ML SOAJ injection     . ferrous sulfate 325 (65 FE) MG tablet Take 325 mg by mouth daily with breakfast.    . fluticasone (FLONASE) 50 MCG/ACT nasal spray Use as needed    . furosemide (LASIX) 40 MG tablet TAKE 1 TABLET (40 MG TOTAL) BY MOUTH DAILY. 90 tablet 0  . HYDROcodone-acetaminophen (NORCO/VICODIN) 5-325 MG per tablet Take 1 tablet by mouth TID prn for pain 90 tablet 0  . KLOR-CON M20 20 MEQ tablet TAKE 1 TABLET (20 MEQ TOTAL) BY MOUTH DAILY. 30 tablet 4  . KLOR-CON M20 20 MEQ tablet TAKE 1 TABLET (20 MEQ TOTAL) BY MOUTH DAILY. 30 tablet 4  . Multiple Vitamin (MULTIVITAMIN) capsule Take 1 capsule by mouth daily.      Marland Kitchen omeprazole (PRILOSEC) 20 MG capsule Take 20 mg by mouth daily.    . orphenadrine (NORFLEX) 100 MG tablet Take 1 tablet (100 mg total) by mouth 2 (two) times daily. 60 tablet 0  . pravastatin (PRAVACHOL) 40 MG tablet TAKE 1 TABLET (40 MG TOTAL) BY MOUTH DAILY. 90 tablet 3  . traZODone (DESYREL) 100 MG tablet TAKE 1 TABLET BY MOUTH AT BEDTIME 30 tablet 5   No current facility-administered medications on file prior to visit.

## 2015-02-05 NOTE — Telephone Encounter (Signed)
Still awaiting response from SN. Pt is aware.

## 2015-02-05 NOTE — Telephone Encounter (Signed)
Per SN---  zpak #1  Take as directed MMW  4 oz   1 tsp gargle and swallow QID Delsym OTC 2 tsp BID  Spoke with the pt and she is aware of SN recs. Nothing further is needed.

## 2015-02-05 NOTE — Telephone Encounter (Signed)
Pt returned call,  [phone]

## 2015-02-05 NOTE — Telephone Encounter (Signed)
Pt returned call - [phone]

## 2015-02-09 ENCOUNTER — Telehealth: Payer: Self-pay | Admitting: *Deleted

## 2015-02-09 NOTE — Telephone Encounter (Signed)
"  Someone called me, trying to see what's the question."    I attempted to call, she was not at home.  "Call back around 11, she should be here then."  I called her on her mobile.  I don't know who may have called you.  It may have been the surgical center calling you with the time.  "Okay, I'm out driving right now.  I'll check the number when I get home and I'll give them a call."

## 2015-02-21 DIAGNOSIS — L97509 Non-pressure chronic ulcer of other part of unspecified foot with unspecified severity: Secondary | ICD-10-CM | POA: Diagnosis not present

## 2015-02-28 ENCOUNTER — Encounter: Payer: Medicare HMO | Admitting: Podiatry

## 2015-03-02 ENCOUNTER — Encounter: Payer: Self-pay | Admitting: Podiatry

## 2015-03-02 ENCOUNTER — Ambulatory Visit (INDEPENDENT_AMBULATORY_CARE_PROVIDER_SITE_OTHER): Payer: BC Managed Care – PPO

## 2015-03-02 ENCOUNTER — Ambulatory Visit (INDEPENDENT_AMBULATORY_CARE_PROVIDER_SITE_OTHER): Payer: BC Managed Care – PPO | Admitting: Podiatry

## 2015-03-02 VITALS — BP 113/64 | HR 56 | Resp 12

## 2015-03-02 DIAGNOSIS — Z9889 Other specified postprocedural states: Secondary | ICD-10-CM

## 2015-03-02 MED ORDER — OXYCODONE-ACETAMINOPHEN 5-325 MG PO TABS: 1.0000 | ORAL_TABLET | Freq: Four times a day (QID) | ORAL | Status: DC | PRN

## 2015-03-02 NOTE — Patient Instructions (Signed)
Continue with surgical shoe.  Keep dressing clean, dry, intact Monitor for any signs/symptoms of infection. Call the office immediately if any occur or go directly to the emergency room. Call with any questions/concerns.

## 2015-03-05 NOTE — Progress Notes (Signed)
Patient ID: [name], female   DOB: 1941-09-20, 74 y.o.   MRN: [id number]  Subjective: 74 year old female presents the Same postop visit #1 status post left second toe amputation performed on 07/24/2015. She states that she's had some intermittent discomfort to the area however overall she is doing well. She denies any systemic complaints as fevers, chills, nausea, vomiting. Denies any calf pain, chest pain, shortness of breath. She'll continue with a surgical shoe. No other complaints at this time in no acute changes.  Objective: AAO 3, NAD, presents wearing a surgical shoe DP/PT pulses palpable, CRT less than 3 seconds Protective sensation appears to be intact with Derrel Nip monofilament Dressings clean, dry, intact.  Incision status post left second toe imitation is well coapted without any evidence of dehiscence and sutures are intact. There is no swelling erythema, increase in warmth, drainage, malodor, or any other clinical signs of infection. There is trace edema over the surgical site. There is trace discomfort upon palpation of the surgical site. No other areas of tenderness to bilateral lower x-rays. No open lesions or pre-ulcer lesions identified bilaterally. No pain with calf compression, swelling, warmth, erythema.  Assessment: 74 year old female 1 week status post left second toe amputation, doing well  Plan: -At today's appointment the dressings were removed. -Antibiotic ointment was placed over the incision followed by a dry sterile dressing. Recommend daily dressing clean, dry, intact. -Continue a surgical shoe -Elevation -Pain meds as needed -Follow-up in one week for likely suture removal or sooner if any problems are to arise. In the meantime encouraged to call the office with any questions, concerns, change in symptoms.

## 2015-03-09 ENCOUNTER — Encounter: Payer: Self-pay | Admitting: Podiatrist

## 2015-03-09 ENCOUNTER — Ambulatory Visit (INDEPENDENT_AMBULATORY_CARE_PROVIDER_SITE_OTHER): Payer: BC Managed Care – PPO | Admitting: Podiatrist

## 2015-03-09 VITALS — BP 116/76 | HR 77 | Resp 12

## 2015-03-09 DIAGNOSIS — Z9889 Other specified postprocedural states: Secondary | ICD-10-CM

## 2015-03-09 DIAGNOSIS — L97521 Non-pressure chronic ulcer of other part of left foot limited to breakdown of skin: Secondary | ICD-10-CM

## 2015-03-09 MED ORDER — OXYCODONE-ACETAMINOPHEN 5-325 MG PO TABS: 1.0000 | ORAL_TABLET | Freq: Four times a day (QID) | ORAL | Status: DC | PRN

## 2015-03-09 NOTE — Progress Notes (Signed)
Chief Complaint  Patient presents with  . Routine Post Op    dos 02/21/15 ''lt foot is doing ok.''     [name] presents today for her 2 week post op check status post amputation of 2nd toe of the left 2nd toe stump.  She is doing well. Relates some discomfort in the area of incision site. Denies any local or systemic signs of infection.  Denies any drainage or pus.  Objective:  Neurovascular status intact and unchanged.  Post operative appearance is satisfactory.  Incision site is coapted with sutures in place. No redness, swelling or signs of infection present.  Hyperkeratotic tissue is peri incision area.  Tenderness to palpation is noted.  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  Recommended she continue to dress the area with antibiotic and gauze as was done today.  I will check her back in 2 weeks and will likely have to debride the hyperkeratotic tissue at that visit.  She will maintain the surgical shoes.

## 2015-03-23 ENCOUNTER — Encounter: Payer: Self-pay | Admitting: Podiatrist

## 2015-03-23 ENCOUNTER — Ambulatory Visit: Payer: Self-pay

## 2015-03-23 ENCOUNTER — Ambulatory Visit (INDEPENDENT_AMBULATORY_CARE_PROVIDER_SITE_OTHER): Payer: BC Managed Care – PPO | Admitting: Podiatrist

## 2015-03-23 VITALS — BP 152/69 | HR 78 | Resp 12

## 2015-03-23 DIAGNOSIS — Z9889 Other specified postprocedural states: Secondary | ICD-10-CM

## 2015-03-23 DIAGNOSIS — L97521 Non-pressure chronic ulcer of other part of left foot limited to breakdown of skin: Secondary | ICD-10-CM

## 2015-03-26 NOTE — Progress Notes (Signed)
Chief Complaint  Patient presents with  . Routine Post Op    DOS 02/21/2015 ''left 2nd toe amputation. ''LT FOOT IS DOING MUCH BETTER.''     [name] presents today for her post op check status post amputation of 2nd toe of the left 2nd toe stump.  She is doing well. Relates mild but improvement in discomfort in the area of incision site. Denies any local or systemic signs of infection.  Denies any drainage or pus.  Objective:  Neurovascular status intact and unchanged.  Incision site is completely healed.  Mild swelling still present at the interspace.  Some interdigital maceration is present but very mild.  No redness, or sign of infection present.   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  She will return if any concerns arise, and will be seen back for follow up is symptoms return.

## 2015-05-16 ENCOUNTER — Encounter: Payer: Self-pay | Admitting: Pulmonary Disease

## 2015-05-16 ENCOUNTER — Ambulatory Visit (INDEPENDENT_AMBULATORY_CARE_PROVIDER_SITE_OTHER): Payer: BC Managed Care – PPO | Admitting: Pulmonary Disease

## 2015-05-16 VITALS — BP 114/70 | HR 69 | Temp 99.1°F | Wt 231.0 lb

## 2015-05-16 DIAGNOSIS — I2510 Atherosclerotic heart disease of native coronary artery without angina pectoris: Secondary | ICD-10-CM | POA: Diagnosis not present

## 2015-05-16 DIAGNOSIS — J449 Chronic obstructive pulmonary disease, unspecified: Secondary | ICD-10-CM | POA: Diagnosis not present

## 2015-05-16 DIAGNOSIS — M8949 Other hypertrophic osteoarthropathy, multiple sites: Secondary | ICD-10-CM

## 2015-05-16 DIAGNOSIS — I872 Venous insufficiency (chronic) (peripheral): Secondary | ICD-10-CM | POA: Diagnosis not present

## 2015-05-16 DIAGNOSIS — M159 Polyosteoarthritis, unspecified: Secondary | ICD-10-CM

## 2015-05-16 DIAGNOSIS — F411 Generalized anxiety disorder: Secondary | ICD-10-CM

## 2015-05-16 DIAGNOSIS — M79601 Pain in right arm: Secondary | ICD-10-CM

## 2015-05-16 DIAGNOSIS — I10 Essential (primary) hypertension: Secondary | ICD-10-CM

## 2015-05-16 DIAGNOSIS — M150 Primary generalized (osteo)arthritis: Secondary | ICD-10-CM

## 2015-05-16 MED ORDER — MELOXICAM 15 MG PO TABS: 15.0000 mg | ORAL_TABLET | Freq: Every day | ORAL | Status: DC | PRN

## 2015-05-16 NOTE — Patient Instructions (Signed)
Today we updated your med list in our EPIC system...    Continue your current medications the same...  We wrote a new prescription for MOBIC (Meloxicam) 15mg  to take one daily as needed for arthritis pain...  We will arrange for an orthopedic f/u appt to check your right arm pain...  For your leg swelling>>    NO SALT in diet!!!    Elevate your legs...    Wear support hose when up 7 about...    Increase your LASIX from one 40mg  tab each AM, to two tabs in the AM when the swelling doesn't go down overnight...  Call for any questions...  Let's plan a follow up visit in 68mo, sooner if needed for problems.Marland KitchenMarland Kitchen

## 2015-05-17 ENCOUNTER — Encounter: Payer: Self-pay | Admitting: Pulmonary Disease

## 2015-05-17 NOTE — Progress Notes (Signed)
Subjective:    Patient ID: [name], female    DOB: [birth year], 74 y.o.   MRN: [id number]  HPI 74 y/o BF here for a follow up of her mult med problems including: Allergies, HBP, CAD, Chol, Obesity, etc... she is also followed by DrCrenshaw for Cards, & DrESL for allergies...  ~  July 29, 2012:  1mo ROV & Janel notes that her prev back discomfort is better w/ prn Mobic & Vicodin (requests refills today)... Now c/o cramps in her legs & wonders if she needs to incr her potassium (we will check level=3.8);  She has Lasix40 but choses to take it intermittently noting that she doesn't pee as much if she takes one daily vis-a-vis 2 Qod;  Still on the K20 taking one daily;  Weight is down 14# to 234# today...    She had f/u VenDopplers by [facility] 5/13> no evid DVT, left GSV ablated, other VV noted; no reflux seen & the fitted her for knee high compression hose but they are "too hard to wear every day"...    We reviewed prob list, meds, xrays and labs> see below for updates >>  LABS 8/13:  FLP- at goals on Prav40;  Chems- all wnl;  CBC- ok w/ Hg=11.8 MCV=81 Fe=49 (12%sat);  TSH=1.38... rec to start FeSo4 daily.   ~  January 28, 2013:  61mo ROV & her CC is leg discomfort for which she has been eval by Alpha Gula- we do not have his notes but she reports XRays OK & she reports that steroid pack x8d really helped... We reviewed the following medical problems during today's office visit >>     COPD> smoker, hx pos PPD, AR> on Symbicort160, Proair prn, Nasonex prn; she says she quit smoking 56mo ago & she is encouraged to remain quit! She denies cough, sput, hemoptysis, ch in dyspnea, etc...    HBP> on Lasix40, K20; BP= 122/80 & she denies CP, palpit, dizzy, SOB, worsening edema...    CAD> on ASA81; followed by drCrenshaw & seen 1/14- stable & no changes made...    VI> she knows to elim sodium, elev legs, wear support hose, & take the Lasix40...    Chol> on Prav40; last FLP 8/13 looked good w/ TChol 141, TG  92, HDL 63, LDL 60    Overweight> wt= 232# which is down 2#, but her BMI is 39 & she has a long way to go...    GI- GERD, Polyps, known gallstones> on Omep20; she denies abd pain, dysphagia, n/v, c/d, blood seen...    DJD, s/p left TKR, LBP> on Mobic15, Aleve prn, Vicodin prn; managed by Ortho- DrBeane...    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    Arnold Chiari malformation & hx seizures> she had a suboccipital craniectomy and C1 laminectomy for Arnold-Chiari malformation by DrKritzer in 1993.     Insomnia> on Ambien10 prn... We reviewed prob list, meds, xrays and labs> see below for updates >>   ~  July 28, 2013:  59mo ROV & [name] is c/o "old age" and "aches & pains"...  We reviewed the following medical problems during today's office visit >>     COPD> ex-smoker, hx pos PPD, AR> on Symbicort160, Proair prn, Nasonex prn; she says she quit smoking end of 2013- denies cough, sput, hemoptysis, ch in dyspnea, etc...    HBP> on Lasix40, K20; BP= 108/70 & she denies CP, palpit, dizzy, SOB, worsening edema...    CAD> on  JXB14; followed by Hilary Hertz & seen 1/14- stable & no changes made...    VI> she had laser ablation of left greater saphenous vein 2013 (painful varicosities) by Sheryn Bison; f/u Venous Dopplers 4/14 showed no evid of DVT...    Chol> on Prav40; FLP 8/14 looked good w/ TChol 133, TG 101, HDL 54, LDL 59    Overweight> wt= 230# which is down 2#, but her BMI is 39 & she has a long way to go...    GI- GERD, Polyps, known gallstones> on Omep20; she denies abd pain, dysphagia, n/v, c/d, blood seen...    DJD, s/p bilat TKRs, LBP> on Mobic15, Aleve prn, Vicodin prn; managed by Ortho- DrBeane...    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    Arnold Chiari malformation & hx seizures> she had a suboccipital craniectomy and C1 laminectomy for Arnold-Chiari malformation by DrKritzer in 1993.     Insomnia> prev on Ambien10 prn, now on Desyrel50 => incr to 100mg   qhs...  We reviewed prob list, meds, xrays and labs> see below for updates >>   LABS 8/14:  FLP- at goals on Prav40;  Chems- wnl;  CBC- Hg=11.3, Fe=47 (14%sat);  TSH= 0.96;  VitD= 48...  ~  August 31, 2014:  24mo ROV & [name] is c/o some LBP after moving some furniture- rec to try ice/heat, Tylenol, and let me know if getting worse for Ortho referral;  She has been seeing Podiatry re- hammertoes/ corns/ callouses and may need surg;  We reviewed the following medical problems during today's office visit >>     AR> she sees DrVanWinkle for allergies and "asthma" on shots one per month now...    COPD> ex-smoker, hx pos PPD, AR> on Symbicort160, Proair prn, Flonase prn; she says she quit smoking end of 2013- denies cough, sput, hemoptysis, ch in dyspnea, etc...    HBP> on Lasix40, K20; BP= 112/80 & she denies CP, palpit, dizzy, SOB, worsening edema...    CAD> on ASA81; followed by DrCrenshaw & seen 1/15- stable & no changes made...    VI> she had laser ablation of left greater saphenous vein 2013 (painful varicosities) by Sheryn Bison; f/u Venous Dopplers 4/14 showed no evid of DVT...    Chol> on Prav40; FLP 10/15 looks good w/ TChol 125, TG 112, HDL 53, LDL 50    Overweight> wt= 229# which is down 1#, but her BMI is 39 & she has a long way to go...    GI- GERD, Polyps, known gallstones> on Omep20; she denies abd pain, dysphagia, n/v, c/d, blood seen...    DJD, s/p bilat TKRs, LBP> on Vicodin prn; managed by Ortho- DrBeane & we do not have notes from him...    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    Arnold Chiari malformation & hx seizures> she had a suboccipital craniectomy and C1 laminectomy for Arnold-Chiari malformation by DrKritzer in 1993.     Insomnia> prev on Ambien10 prn, now on Desyrel100mg  qhs...  We reviewed prob list, meds, xrays and labs> see below for updates >>   LABS 10/15:  FLP- at goals on Prav40;  Chems- wnl;  CBC- ok w/ Hg=12.0 MCV=81 Fe=70(20%) & rec to  continue Fe daily;  TSH=0.81...  ~  May 16, 2015:  3mo ROV & [name] presents w/ c/o tingling in her right arm and left shoulder- sl painful w/ ROM & we discussed Rx w/ Mobic and referral to Ortho for further eval..Marland Kitchen  She also c/o incr leg edema- she is on Lasix40 & reminded of the importance of low sodium diet... We reviewed the following medical problems during today's office visit >>     AR> she sees DrVanWinkle for allergies and "asthma" on shots once per month now...    COPD> ex-smoker, hx pos PPD, AR> on Symbicort160, Proair prn, Flonase prn; she says she quit smoking end of 2013- denies cough, sput, hemoptysis, ch in dyspnea, etc...    HBP> on Lasix40, K20; BP= 114/70 & she denies CP, palpit, dizzy, SOB but notes worsening edema as above=> rec incr Lasix to 1-2 tabs Qam.    CAD> on ASA81; followed by DrCrenshaw & seen 1/16- stable & f/u Myoview 1/16 showed no symptoms or EKG changes, no evid of ischemia, norm wall motion, EF=58%, NEG study...    VI> she had laser ablation of left greater saphenous vein 2013 (painful varicosities) by Sheryn Bison; f/u Venous Dopplers 4/14 showed no evid of DVT...    Chol> on Prav40; FLP 10/15 looks good w/ TChol 125, TG 112, HDL 53, LDL 50    Overweight> wt= 231# which is up 2#, w/ BMI= 39 & she has a long way to go...    GI- GERD, Polyps, known gallstones> on Omep20; she denies abd pain, dysphagia, n/v, c/d, blood seen...    DJD, s/p bilat TKRs, LBP> on Vicodin prn; managed by Ortho- DrBeane & we do not have notes from him=> try Mobic & refer back to ortho for arm complaints...    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    Arnold Chiari malformation & hx seizures> she had a suboccipital craniectomy and C1 laminectomy for Arnold-Chiari malformation by DrKritzer in 1993.     Insomnia> prev on Ambien10 prn, now on Desyrel100mg  qhs...  We reviewed prob list, meds, xrays and labs> see below for updates >>  IMP/PLAN>>  We discussed transient incr Lasix  to 1-2 tabs qam depending on edema, reminded  Of low sodium etc;  We will refer to Ortho for arm complaints for eval;  Continue other meds and ROV in 72mo w/ fasting labs...           Problem List:  ALLERGIC RHINITIS (ICD-477.9) > on NASONEX, ASTEPRO, ZYRTEK, & allergy shots- followed by DrVanWinkle... POSITIVE PPD (ICD-795.5) CIGARETTE SMOKER CHRONIC OBSTRUCTIVE ASTHMA UNSPECIFIED (ICD-493.20) - she had +allergy skin testing for dust mites and started on allergy shots per DrESL along w/ SYMBICORT 160- 2spBid, but she doesn't take anything regularly... +smoker but decreased from 1/2 ppd x 58yrs to 1-2 cig/d. ~  2/11: she reports +PPD on pre-employment testing> sent to health dept, CXR- neg, & started on INH 300mg /d x31mo (they never sent records for [place] to review). ~  CXR 2/11 showed borderline heart size, tort Ao, sl incr markings, NAD... ~  subseq CXRs done at health dept for her pos PPD but we don't have the films or reports... ~  2/13: Refractory AB treated via ER w/ Doxy, Pred, restart Symbicort, +Mucinex, etc; we decided upon Levaquin, Medrol, MMW... ~  CXR 2/13 showed mild cardiomeg, ectatic Ao & coronary calcif, DJD sp, NAD; CTAngio was neg for PE, +bronchial wall thickening, NAD.Marland Kitchen. ~  8/13: she tells me "I saw my asthma doctor last week- doing OK" no changes in meds from DrVanWinkle... ~  2/14: on Symbicort160, Proair prn, Nasonex prn; she says she quit smoking 13mo ago & she is encouraged to remain quit! She denies cough, sput,  hemoptysis, ch in dyspnea, etc. ~  8/14: Hx COPD> ex-smoker, hx pos PPD, AR> on Symbicort160, Proair prn, Nasonex prn; she says she quit smoking end of 2013- denies cough, sput, hemoptysis, ch in dyspnea, etc. ~  10/15: on Symbicort160, Proair prn, Flonase prn; she says she quit smoking end of 2013- denies cough, sput, hemoptysis, ch in dyspnea, etc; still gets alergy shots once per month now...  Hx of HYPERTENSION (ICD-401.9) - off Lisinopril/Hct (Hx ACE reaction) &  taking LASIX 40mg  as needed for swelling... ~  EKG 6/12 showed SBrady w/ rate= 55, otherw WNL, NAD.Marland Kitchen. ~  5/13:  BP=120/78 today and denies HA, fatigue, visual changes, CP, palipit, dizziness, syncope, dyspnea, edema, etc... ~  8/13:  BP= 124/82 & she remains largely asymtomatic... ~  2/14: on Lasix40, K20; BP= 122/80 & she denies CP, palpit, dizzy, SOB, worsening edema.  ~  8/14: on Lasix40, K20; BP= 108/70 & she remains asymptomatic... ~  10/15: on Lasix40, K20; BP= 112/80 & she denies CP, palpit, dizzy, SOB, worsening edema  CAD (ICD-414.00) - known non-obstructive disease & atypic CP... on ASA 325mg /d, STATIN (Prav40), & off ACE. ~  cath 2/05 showed 20-60% obstructions in all 3 vessels... good LVF... ~  NuclearStressTest 3/09 was negative- no ischemia or infarction, EF=68%... ~  Eval for recurrent CP- EKG showed NSR, NAD;  2DEcho showed mild focal basal septal hypertrophy, norm wall motion w/ EF= 55-65%, mild DD & PAsys= 37;  Myoview NEG- no scar, no ischemia, EF=64%... ~  ROV w/ DrCrenshaw 7/11 reviewed- continue ASA/ Statin/ smoking cessation/ monitor BP & Chol. ~  She saw DrCrenshaw 1/13 for f/u CAD> last Myoview & 2DEcho were in 2010; she's been stable on ASA81mg /d, Lasix, KCl, Prav40; she denies angina & DrCrenshaw rec quit smoking, same meds, lose weight... Note> CTA 2/13 showed atherosclerotic changes seen in Ao & coronaries, mild cardiomegaly ~  She had yearly f/u DrCrenshaw 1/14> felt to be stable, no changes made;  EKG showed SBrady, rate 49, NSSTTWA... ~  on ASA81; followed by DrCrenshaw & seen 1/15- stable & no changes made...  VENOUS INSUFFICIENCY (ICD-459.81) - she follows a low salt diet... +LASIX 40mg /d for swelling... ~  1/10: we discussed no salt, Lasix20, elevation, support hose. ~  She was evaluated by Sheryn Bison [facility] w/ severe ven insuffic left leg w/ pain & chr swelling, bulging varicosities; using compression hose, no salt, elevation, & Ibuprofen w/o improvement;  He plans  laser ablation of the GSV & then he'll consider stab phlebectomy of secondary varicosities later... ~  4/13: s/p left GSV laser ablation; she is feeling better w/ decr pain etc after the procedure... ~  5/13:  F/u VenDopplers by [facility] w/ good ablation, no evid DVT etc... ~  4/14:  She had f/u DrLawson> stable, no DVT, rec compression hose...  HYPERCHOLESTEROLEMIA (ICD-272.0) - on PRAVASTATIN 40mg /d now + diet efforts. ~  FLP 5/08 on Cres5 showed TChol 115, TG 83, HDL 50, LDL 49... tolerating well and taking med regularly. ~  [place] 3/09 on Cres5 showed TChol 126, TG 104, HDL 56, LDL 49... rec- same. ~  FLP 3/10 on Cres5 showed TChol 152, TG 97, HDL 77, LDL 55 ~  FLP 12/10 on Cres5 showed TChol 127, TG 100, HDL 52, LDL 55 ~  7/11:  pt requests change to less expensive statin> try PRAVASTATIN40 ~  FLP 10/11 on Prav40 showed TChol 143, TG 125, HDL 51, LDL 67... Continue same. ~  FLP 6/12 on Prav40 showed  TChol 143, TG 145, HDL 61, LDL 53 ~  FLP 8/13 on Prav40 showed TChol 141, TG 92, HDL 63, LDL 60 ~  FLP 8/14 on Prav40 showed TChol 133, TG 101, HDL 54, LDL 59 ~  FLP 10/15 on Prav40 showed TChol 125, TG 112, HDL 53, LDL 50  OBESITY (ICD-278.00) - unable to diet effectively and get weight down...  ~  weight 5/10 = 238#, 5\' 5"  tall,  BMI= 40... we discussed diet and exercise program... again! ~  weight 1/11 = 228# ~  weight 7/11 = 232# ~  weight 10/11 = 230# ~  Weight 6/12 = 232# ~  Weight 10/12 = 234# ~  Weight 2/13 = 241# ~  Weight 4/13 = 248#... She is asked to get wt down!!! ~  Weight 8/13 = 234#... Great job! ~  [employer] 2/14 = 232# ~  Weight 8/14 = 230# ~  Weight 10/15 = 229#  GERD (ICD-530.81) - EGD 7/05 by DrStark w/ HH- supposed to be on PPI (Omeprazole 20mg ) & H2 blocker (Pepcid 20mg ) at bedtime... ~  2/14: she takes the Omep20 daily but using the Zantac as needed...  COLONIC POLYPS (ICD-211.3) - last colonoscopy was 12/03 showing several 3-74mm polyps (hyperplastic)...  HX OF  GALLSTONE (ICD-V12.79) - seen on CT 7/06 and referred to [facility]- eval by DrCornett and being followed...  DEGENERATIVE JOINT DISEASE (ICD-715.90) - followed by DrBeane for ortho... Right knee arthroscopy 1/09 without help... s/p right TKR 06/12/08 by Alpha Gula... 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 she is off the Etodolac & takes PERCOCET Prn... ~  1/11: now complaining of left knee pain & will f/u w/ DrBeane... ~  6/12:  DrBeane plans left TKR soon... OK for surg... ~  7/12: s/p left TKR per DrBeane & went to [facility]hab... ~  8/13:  She requests refill MOBIC 15mg  as needed... ~  10/15: on Vicodin prn, we do not have notes from Ortho...  BACK PAIN, LUMBAR (ICD-724.2) >> presented 4/13 w/ pain in left side of back & radiating down leg leg... Given Vicodin, anti-inflamm med & muscle relaxer;  Referred to Ortho for further eval==> ?if she ever saw Ortho, pain resolved w/ Mobic, Norco, rest, heat, etc...  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   Past Surgical History  Procedure Laterality Date  . Abdominal hysterectomy    . Cspine surgery  1993    for arnold-chiari malformation  . Right knee arthroscopy  12/2007    Dr. Tonita Cong  . 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 Tonita Cong  . Joint replacement  06/05/11    Left total knee replacement    Outpatient Encounter Prescriptions as of 05/16/2015  Medication Sig  . albuterol (PROVENTIL HFA;VENTOLIN HFA) 108 (90 BASE) MCG/ACT inhaler Inhale 2 puffs into the lungs every 4 (four) hours as needed. For wheeze or shortness of breath  . aspirin 81 MG tablet Take 81 mg by mouth daily.    Marland Kitchen azithromycin (ZITHROMAX) 250 MG tablet Take as directed  . ferrous sulfate 325 (65 FE)  MG tablet Take 325 mg by mouth daily with breakfast.  . fluticasone (FLONASE) 50 MCG/ACT nasal spray Use as needed  . furosemide (LASIX) 40 MG tablet TAKE 1 TABLET (40 MG  TOTAL) BY MOUTH DAILY.  Marland Kitchen HYDROcodone-acetaminophen (NORCO/VICODIN) 5-325 MG per tablet Take 1 tablet by mouth TID prn for pain  . KLOR-CON M20 20 MEQ tablet TAKE 1 TABLET (20 MEQ TOTAL) BY MOUTH DAILY.  . Multiple Vitamin (MULTIVITAMIN) capsule Take 1 capsule by mouth daily.    Marland Kitchen omeprazole (PRILOSEC) 20 MG capsule Take 20 mg by mouth daily.  . pravastatin (PRAVACHOL) 40 MG tablet TAKE 1 TABLET (40 MG TOTAL) BY MOUTH DAILY.  . traZODone (DESYREL) 100 MG tablet TAKE 1 TABLET BY MOUTH AT BEDTIME  . budesonide-formoterol (SYMBICORT) 160-4.5 MCG/ACT inhaler Inhale 2 puffs into the lungs 2 (two) times daily.  . meloxicam (MOBIC) 15 MG tablet Take 1 tablet (15 mg total) by mouth daily as needed for pain.  . [DISCONTINUED] amoxicillin-clavulanate (AUGMENTIN) 875-125 MG per tablet   . [DISCONTINUED] Diphenhyd-Hydrocort-Nystatin (FIRST-DUKES MOUTHWASH) SUSP 1 tsp gargle and swallow four times daily  . [DISCONTINUED] EPIPEN 2-PAK 0.3 MG/0.3ML SOAJ injection   . [DISCONTINUED] KLOR-CON M20 20 MEQ tablet TAKE 1 TABLET (20 MEQ TOTAL) BY MOUTH DAILY.  . [DISCONTINUED] orphenadrine (NORFLEX) 100 MG tablet Take 1 tablet (100 mg total) by mouth 2 (two) times daily.  . [DISCONTINUED] oxyCODONE-acetaminophen (PERCOCET/ROXICET) 5-325 MG per tablet   . [DISCONTINUED] oxyCODONE-acetaminophen (PERCOCET/ROXICET) 5-325 MG per tablet Take 1-2 tablets by mouth every 6 (six) hours as needed for severe pain.   No facility-administered encounter medications on file as of 05/16/2015.    No Known Allergies   Current Medications, Allergies, Past Medical History, Past Surgical History, Family History, and Social History were reviewed in [employer] record.    Review of Systems         See HPI - all other systems neg except as  noted... The patient complains of dyspnea on exertion & gait abn.  The patient denies anorexia, fever, weight loss, weight gain, vision loss, decreased hearing, hoarseness, chest pain, syncope, worsening edema, prolonged cough, headaches, hemoptysis, abdominal pain, melena, hematochezia, severe indigestion/heartburn, hematuria, incontinence, muscle weakness, suspicious skin lesions, transient blindness, depression, unusual weight change, abnormal bleeding, enlarged lymph nodes, and angioedema.     Objective:   Physical Exam      WD, Overweight, 74 y/o BF in NAD... GENERAL:  Alert & oriented; pleasant & cooperative... HEENT:  [place]/AT, EOM-wnl, PERRLA, EACs-clear, TMs-wnl, NOSE-clear, THROAT-clear & wnl, no lesions seen; VOICE- sl hoarse. NECK:  Supple w/ decrROM; no JVD; normal carotid impulses w/o bruits; no thyromegaly or nodules palpated; no lymphadenopathy. CHEST:  Few scat rhonchi, mild congestion & exp wheezing, no consolidation... HEART:  Regular Rhythm, gr 1/6 SEM without rubs or gallops... ABDOMEN:  Obese, soft & nontender; normal bowel sounds; no organomegaly or masses detected. EXT:  s/p right TKR, mod arthritic changes; +varicose veins (s/p laser ablation left GSV 4/13)/ +venous insuffic/ 1+ edema... NEURO:  CN's intact; no focal neuro deficits... DERM:  No lesions noted; no rash etc...  RADIOLOGY DATA:  Reviewed in the EPIC EMR & discussed w/ the patient...  LABORATORY DATA:  Reviewed in the EPIC EMR & discussed w/ the patient...   Assessment & Plan:    Hx Asthma/ COPD/ AB, smoker (?ex), & hx pos PPD- treated> She's had refractory "attack" & treated in past w/ Levaquin 500mg /d, Medrol dosepak, Mucinex 2Bid, MMW, etc; improved off cigs & on Symbicort...  HBP>  BP controlled on diet + Lasix Rx...  CAD>  Denies CP, angina, etc;  On ASA, statin, off cigs, etc...  Ven Insuffic>  On sodium  restriction, elevation, support hose; also on LASIX to 40mg /d & followed by [facility] &  s/p laser ablation of left GSV- improved.  CHOL>  Back on Prav40 & FLP is stable...  OBESITY>  Needs better diet & hopefully incr exerc after the knee surg...  GI>  GERD, Polyps, Gallstone> follwed by DrStark & stable on Omep20 but uses prn only...  S/P left TKR 7/12 by DrBeane>  Course complic by bleed into left calf; now stable & improving w/ PT etc...  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 She will need Ortho referral for further eval...  Other medical issues as noted...   Patient's Medications  New Prescriptions   MELOXICAM (MOBIC) 15 MG TABLET    Take 1 tablet (15 mg total) by mouth daily as needed for pain.  Previous Medications   ALBUTEROL (PROVENTIL HFA;VENTOLIN HFA) 108 (90 BASE) MCG/ACT INHALER    Inhale 2 puffs into the lungs every 4 (four) hours as needed. For wheeze or shortness of breath   ASPIRIN 81 MG TABLET    Take 81 mg by mouth daily.     AZITHROMYCIN (ZITHROMAX) 250 MG TABLET    Take as directed   BUDESONIDE-FORMOTEROL (SYMBICORT) 160-4.5 MCG/ACT INHALER    Inhale 2 puffs into the lungs 2 (two) times daily.   FERROUS SULFATE 325 (65 FE) MG TABLET    Take 325 mg by mouth daily with breakfast.   FLUTICASONE (FLONASE) 50 MCG/ACT NASAL SPRAY    Use as needed   FUROSEMIDE (LASIX) 40 MG TABLET    TAKE 1 TABLET (40 MG TOTAL) BY MOUTH DAILY.   HYDROCODONE-ACETAMINOPHEN (NORCO/VICODIN) 5-325 MG PER TABLET    Take 1 tablet by mouth TID prn for pain   KLOR-CON M20 20 MEQ TABLET    TAKE 1 TABLET (20 MEQ TOTAL) BY MOUTH DAILY.   MULTIPLE VITAMIN (MULTIVITAMIN) CAPSULE    Take 1 capsule by mouth daily.     OMEPRAZOLE (PRILOSEC) 20 MG CAPSULE    Take 20 mg by mouth daily.   PRAVASTATIN (PRAVACHOL) 40 MG TABLET    TAKE 1 TABLET (40 MG TOTAL) BY MOUTH DAILY.   TRAZODONE (DESYREL) 100 MG TABLET    TAKE 1 TABLET BY MOUTH AT BEDTIME  Modified Medications   No  medications on file  Discontinued Medications   AMOXICILLIN-CLAVULANATE (AUGMENTIN) 875-125 MG PER TABLET       DIPHENHYD-HYDROCORT-NYSTATIN (FIRST-DUKES MOUTHWASH) SUSP    1 tsp gargle and swallow four times daily   EPIPEN 2-PAK 0.3 MG/0.3ML SOAJ INJECTION       KLOR-CON M20 20 MEQ TABLET    TAKE 1 TABLET (20 MEQ TOTAL) BY MOUTH DAILY.   ORPHENADRINE (NORFLEX) 100 MG TABLET    Take 1 tablet (100 mg total) by mouth 2 (two) times daily.   OXYCODONE-ACETAMINOPHEN (PERCOCET/ROXICET) 5-325 MG PER TABLET       OXYCODONE-ACETAMINOPHEN (PERCOCET/ROXICET) 5-325 MG PER TABLET    Take 1-2 tablets by mouth every 6 (six) hours as needed for severe pain.

## 2015-05-22 ENCOUNTER — Telehealth: Payer: Self-pay | Admitting: Pulmonary Disease

## 2015-05-23 NOTE — Telephone Encounter (Signed)
Form filled and signed by SN. Pt called to see if form should be mailed to her or if she was going to pick it up at office. Left voicemail. Waiting on pt to return call. Paperwork is on [employer]

## 2015-05-23 NOTE — Telephone Encounter (Signed)
[name] - has this been taken care of?  Please advise.

## 2015-05-24 NOTE — Telephone Encounter (Signed)
Pt called this morning and said to mail forms to her.[name]

## 2015-05-24 NOTE — Telephone Encounter (Signed)
This was placed in the mail. Nothing further needed

## 2015-06-05 ENCOUNTER — Ambulatory Visit (INDEPENDENT_AMBULATORY_CARE_PROVIDER_SITE_OTHER): Payer: Medicare HMO | Admitting: Podiatry

## 2015-06-05 DIAGNOSIS — I2510 Atherosclerotic heart disease of native coronary artery without angina pectoris: Secondary | ICD-10-CM | POA: Diagnosis not present

## 2015-06-05 DIAGNOSIS — Q828 Other specified congenital malformations of skin: Secondary | ICD-10-CM

## 2015-06-05 NOTE — Progress Notes (Signed)
   Subjective:    Patient ID: [name], female    DOB: May 28, 1941, 74 y.o.   MRN: [id number]  HPI  Patient presents here today with left great toe pain, since July 3rd and has not gotten worse. She has not treated the pain with anything. This patient presents to the office with pain under her big toe left foot.  She says the pain is severe and she has worn a pad to relieve the pain.  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 Review of Systems  All other systems reviewed and are negative.      Objective:   Physical Exam  Objective: Review of past medical history, medications, social history and allergies were performed.  Vascular: Dorsalis pedis and posterior tibial pulses were palpable B/L, capillary refill was  WNL B/L, temperature gradient was WNL B/L   Skin:  No signs of symptoms of infection or ulcers on both feet.  Localized porokeratosis under left hallux.  Nails: appear healthy with no signs of mycosis or infections  Sensory: Semmes Weinstein monifilament WNL   Orthopedic: Orthopedic evaluation demonstrates all joints distal t ankle have full ROM without crepitus, muscle power WNL B/L.  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      Assessment & Plan:  Porokeratosis left hallux  IE  Debride porokeratosis.

## 2015-06-27 ENCOUNTER — Other Ambulatory Visit: Payer: Self-pay | Admitting: Pulmonary Disease

## 2015-07-16 ENCOUNTER — Other Ambulatory Visit: Payer: Self-pay | Admitting: Pulmonary Disease

## 2015-07-16 NOTE — Telephone Encounter (Signed)
ATC received fast busy line x 3 wcb

## 2015-07-17 NOTE — Telephone Encounter (Signed)
lmomtcb x1 for pt 

## 2015-07-18 MED ORDER — HYDROCODONE-ACETAMINOPHEN 5-325 MG PO TABS
ORAL_TABLET | ORAL | Status: DC
Start: 2015-07-18 — End: 2017-01-07

## 2015-07-18 NOTE — Telephone Encounter (Signed)
Called and spoke to pt. Pt requesting refill on Vicodin 5-325mg  (1 tab TID prn). Last filled on 1.18.16 for #90 with 0 refills. Pt last seen on 6.15.16 by SN.   Dr. Lenna Gilford please advise. Thanks.   No Known Allergies  Current Outpatient Prescriptions on File Prior to Visit  Medication Sig Dispense Refill  . albuterol (PROVENTIL HFA;VENTOLIN HFA) 108 (90 BASE) MCG/ACT inhaler Inhale 2 puffs into the lungs every 4 (four) hours as needed. For wheeze or shortness of breath 1 Inhaler 6  . aspirin 81 MG tablet Take 81 mg by mouth daily.      Marland Kitchen azithromycin (ZITHROMAX) 250 MG tablet Take as directed 6 tablet 0  . budesonide-formoterol (SYMBICORT) 160-4.5 MCG/ACT inhaler Inhale 2 puffs into the lungs 2 (two) times daily. 1 Inhaler 5  . ferrous sulfate 325 (65 FE) MG tablet Take 325 mg by mouth daily with breakfast.    . fluticasone (FLONASE) 50 MCG/ACT nasal spray Use as needed    . furosemide (LASIX) 40 MG tablet TAKE 1 TABLET (40 MG TOTAL) BY MOUTH DAILY. 90 tablet 0  . HYDROcodone-acetaminophen (NORCO/VICODIN) 5-325 MG per tablet Take 1 tablet by mouth TID prn for pain 90 tablet 0  . KLOR-CON M20 20 MEQ tablet TAKE 1 TABLET (20 MEQ TOTAL) BY MOUTH DAILY. 30 tablet 4  . KLOR-CON M20 20 MEQ tablet TAKE 1 TABLET (20 MEQ TOTAL) BY MOUTH DAILY. 30 tablet 3  . meloxicam (MOBIC) 15 MG tablet Take 1 tablet (15 mg total) by mouth daily as needed for pain. 30 tablet 2  . Multiple Vitamin (MULTIVITAMIN) capsule Take 1 capsule by mouth daily.      Marland Kitchen omeprazole (PRILOSEC) 20 MG capsule Take 20 mg by mouth daily.    Marland Kitchen oxyCODONE-acetaminophen (PERCOCET/ROXICET) 5-325 MG per tablet TK 1-2 TS PO Q 6 H PRN P  0  . pravastatin (PRAVACHOL) 40 MG tablet TAKE 1 TABLET (40 MG TOTAL) BY MOUTH DAILY. 90 tablet 3  . traZODone (DESYREL) 100 MG tablet TAKE 1 TABLET BY MOUTH AT BEDTIME 30 tablet 5   No current facility-administered medications on file prior to visit.

## 2015-07-18 NOTE — Telephone Encounter (Signed)
Per SN >> okay to refill #90  Rx will be printed. Rx has been signed by SN and placed up front for pick up. Pt is aware. Nothing further was needed.

## 2015-07-23 ENCOUNTER — Encounter: Payer: Self-pay | Admitting: Pulmonary Disease

## 2015-07-30 ENCOUNTER — Other Ambulatory Visit: Payer: Self-pay | Admitting: Pulmonary Disease

## 2015-08-27 ENCOUNTER — Telehealth: Payer: Self-pay | Admitting: Pulmonary Disease

## 2015-08-27 DIAGNOSIS — R252 Cramp and spasm: Secondary | ICD-10-CM

## 2015-08-27 NOTE — Telephone Encounter (Signed)
Called and spoke to pt. Pt c/o left leg (thigh and calf) and right lower leg (just calf) cramping. Pt stated the cramping is intermittent and has been occuring for 4 days. Pt states she is taking Lasix 40mg  QD and started taking the Potassium 63mEq BID when the cramping started. Pt requesting recs from Dr. Lenna Gilford.  Dr. Lenna Gilford, please advise. Thanks.   No Known Allergies  Current Outpatient Prescriptions on File Prior to Visit  Medication Sig Dispense Refill  . albuterol (PROVENTIL HFA;VENTOLIN HFA) 108 (90 BASE) MCG/ACT inhaler Inhale 2 puffs into the lungs every 4 (four) hours as needed. For wheeze or shortness of breath 1 Inhaler 6  . aspirin 81 MG tablet Take 81 mg by mouth daily.      Marland Kitchen azithromycin (ZITHROMAX) 250 MG tablet Take as directed 6 tablet 0  . budesonide-formoterol (SYMBICORT) 160-4.5 MCG/ACT inhaler Inhale 2 puffs into the lungs 2 (two) times daily. 1 Inhaler 5  . ferrous sulfate 325 (65 FE) MG tablet Take 325 mg by mouth daily with breakfast.    . fluticasone (FLONASE) 50 MCG/ACT nasal spray Use as needed    . furosemide (LASIX) 40 MG tablet TAKE 1 TABLET (40 MG TOTAL) BY MOUTH DAILY. 90 tablet 0  . HYDROcodone-acetaminophen (NORCO/VICODIN) 5-325 MG per tablet Take 1 tablet by mouth TID prn for pain 90 tablet 0  . KLOR-CON M20 20 MEQ tablet TAKE 1 TABLET (20 MEQ TOTAL) BY MOUTH DAILY. 30 tablet 4  . KLOR-CON M20 20 MEQ tablet TAKE 1 TABLET (20 MEQ TOTAL) BY MOUTH DAILY. 30 tablet 3  . meloxicam (MOBIC) 15 MG tablet Take 1 tablet (15 mg total) by mouth daily as needed for pain. 30 tablet 2  . Multiple Vitamin (MULTIVITAMIN) capsule Take 1 capsule by mouth daily.      Marland Kitchen omeprazole (PRILOSEC) 20 MG capsule Take 20 mg by mouth daily.    Marland Kitchen oxyCODONE-acetaminophen (PERCOCET/ROXICET) 5-325 MG per tablet TK 1-2 TS PO Q 6 H PRN P  0  . pravastatin (PRAVACHOL) 40 MG tablet TAKE 1 TABLET (40 MG TOTAL) BY MOUTH DAILY. 90 tablet 3  . traZODone (DESYREL) 100 MG tablet TAKE 1 TABLET BY MOUTH  AT BEDTIME 30 tablet 3   No current facility-administered medications on file prior to visit.

## 2015-08-29 NOTE — Telephone Encounter (Signed)
SN is off this week. Please advise Dr. Chase Caller thanks

## 2015-08-30 NOTE — Telephone Encounter (Signed)
Spoke with the pt and notified of recs per MR  She verbalized understanding  Labs ordered

## 2015-08-30 NOTE — Telephone Encounter (Signed)
If kcl does not work, I do not know what to do. She could come in for bmet, phos, mag check, or go to ER or wait for Dr Lenna Gilford to return

## 2015-08-31 ENCOUNTER — Other Ambulatory Visit (INDEPENDENT_AMBULATORY_CARE_PROVIDER_SITE_OTHER): Payer: BC Managed Care – PPO

## 2015-08-31 DIAGNOSIS — R252 Cramp and spasm: Secondary | ICD-10-CM | POA: Diagnosis not present

## 2015-08-31 LAB — MAGNESIUM: Magnesium: 2.1 mg/dL (ref 1.5–2.5)

## 2015-08-31 LAB — BASIC METABOLIC PANEL
BUN: 12 mg/dL (ref 6–23)
CO2: 30 mEq/L (ref 19–32)
Calcium: 9.4 mg/dL (ref 8.4–10.5)
Chloride: 106 mEq/L (ref 96–112)
Creatinine, Ser: 0.66 mg/dL (ref 0.40–1.20)
GFR: 113.02 mL/min (ref >60.00)
Glucose, Bld: 95 mg/dL (ref 70–99)
Potassium: 4.2 mEq/L (ref 3.5–5.1)
Sodium: 143 mEq/L (ref 135–145)

## 2015-08-31 LAB — PHOSPHORUS: Phosphorus: 3.5 mg/dL (ref 2.3–4.6)

## 2015-10-27 ENCOUNTER — Other Ambulatory Visit: Payer: Self-pay | Admitting: Pulmonary Disease

## 2015-10-28 ENCOUNTER — Other Ambulatory Visit: Payer: Self-pay | Admitting: Pulmonary Disease

## 2015-11-15 ENCOUNTER — Ambulatory Visit: Payer: BC Managed Care – PPO | Admitting: Pulmonary Disease

## 2015-12-19 ENCOUNTER — Ambulatory Visit: Payer: BC Managed Care – PPO | Admitting: Pulmonary Disease

## 2015-12-21 ENCOUNTER — Other Ambulatory Visit (INDEPENDENT_AMBULATORY_CARE_PROVIDER_SITE_OTHER): Payer: BC Managed Care – PPO

## 2015-12-21 ENCOUNTER — Ambulatory Visit (INDEPENDENT_AMBULATORY_CARE_PROVIDER_SITE_OTHER): Payer: BC Managed Care – PPO | Admitting: Pulmonary Disease

## 2015-12-21 ENCOUNTER — Encounter: Payer: Self-pay | Admitting: Pulmonary Disease

## 2015-12-21 VITALS — BP 132/74 | HR 55 | Temp 97.6°F | Ht 65.0 in | Wt 230.8 lb

## 2015-12-21 DIAGNOSIS — J45909 Unspecified asthma, uncomplicated: Secondary | ICD-10-CM

## 2015-12-21 DIAGNOSIS — J449 Chronic obstructive pulmonary disease, unspecified: Secondary | ICD-10-CM

## 2015-12-21 DIAGNOSIS — K219 Gastro-esophageal reflux disease without esophagitis: Secondary | ICD-10-CM

## 2015-12-21 DIAGNOSIS — M8949 Other hypertrophic osteoarthropathy, multiple sites: Secondary | ICD-10-CM

## 2015-12-21 DIAGNOSIS — J4489 Other specified chronic obstructive pulmonary disease: Secondary | ICD-10-CM

## 2015-12-21 DIAGNOSIS — M159 Polyosteoarthritis, unspecified: Secondary | ICD-10-CM

## 2015-12-21 DIAGNOSIS — I10 Essential (primary) hypertension: Secondary | ICD-10-CM

## 2015-12-21 DIAGNOSIS — E7800 Pure hypercholesterolemia, unspecified: Secondary | ICD-10-CM | POA: Diagnosis not present

## 2015-12-21 DIAGNOSIS — F411 Generalized anxiety disorder: Secondary | ICD-10-CM

## 2015-12-21 DIAGNOSIS — M150 Primary generalized (osteo)arthritis: Secondary | ICD-10-CM

## 2015-12-21 LAB — CBC WITH DIFFERENTIAL/PLATELET
Basophils Absolute: 0 10*3/uL (ref 0.0–0.1)
Basophils Relative: 0.7 % (ref 0.0–3.0)
Eosinophils Absolute: 0.1 10*3/uL (ref 0.0–0.7)
Eosinophils Relative: 2.1 % (ref 0.0–5.0)
HCT: 37.9 % (ref 36.0–46.0)
Hemoglobin: 12.1 g/dL (ref 12.0–15.0)
Lymphocytes Relative: 38.6 % (ref 12.0–46.0)
Lymphs Abs: 1.7 10*3/uL (ref 0.7–4.0)
MCHC: 32 g/dL (ref 30.0–36.0)
MCV: 84.1 fl (ref 78.0–100.0)
Monocytes Absolute: 0.4 10*3/uL (ref 0.1–1.0)
Monocytes Relative: 8.3 % (ref 3.0–12.0)
Neutro Abs: 2.3 10*3/uL (ref 1.4–7.7)
Neutrophils Relative %: 50.3 % (ref 43.0–77.0)
Platelets: 183 10*3/uL (ref 150.0–400.0)
RBC: 4.51 Mil/uL (ref 3.87–5.11)
RDW: 13.9 % (ref 11.5–15.5)
WBC: 4.5 10*3/uL (ref 4.0–10.5)

## 2015-12-21 LAB — LIPID PANEL
Cholesterol: 156 mg/dL (ref 0–200)
HDL: 76.1 mg/dL (ref >39.00)
LDL Cholesterol: 65 mg/dL (ref 0–99)
NonHDL: 80.23
Total CHOL/HDL Ratio: 2
Triglycerides: 76 mg/dL (ref 0.0–149.0)
VLDL: 15.2 mg/dL (ref 0.0–40.0)

## 2015-12-21 LAB — HEPATIC FUNCTION PANEL
ALT: 18 U/L (ref 0–35)
AST: 19 U/L (ref 0–37)
Albumin: 3.8 g/dL (ref 3.5–5.2)
Alkaline Phosphatase: 89 U/L (ref 39–117)
Bilirubin, Direct: 0.1 mg/dL (ref 0.0–0.3)
Total Bilirubin: 0.5 mg/dL (ref 0.2–1.2)
Total Protein: 7.1 g/dL (ref 6.0–8.3)

## 2015-12-21 LAB — BASIC METABOLIC PANEL
BUN: 14 mg/dL (ref 6–23)
CO2: 31 mEq/L (ref 19–32)
Calcium: 9.6 mg/dL (ref 8.4–10.5)
Chloride: 104 mEq/L (ref 96–112)
Creatinine, Ser: 0.71 mg/dL (ref 0.40–1.20)
GFR: 103.8 mL/min (ref >60.00)
Glucose, Bld: 80 mg/dL (ref 70–99)
Potassium: 4.5 mEq/L (ref 3.5–5.1)
Sodium: 142 mEq/L (ref 135–145)

## 2015-12-21 LAB — TSH: TSH: 1.91 u[IU]/mL (ref 0.35–4.50)

## 2015-12-21 LAB — IRON: Iron: 89 ug/dL (ref 42–145)

## 2015-12-21 NOTE — Patient Instructions (Signed)
Today we updated your med list in our EPIC system...    Continue your current medications the same...  Today we rechecked your FASTING blood work...    We will contact you w/ the results when available...   Let's get on track w/ our diet 7 exercise program...  Call for any questions...  Let's plan a follow up visit in 56mo, sooner if needed for problems.Marland KitchenMarland Kitchen

## 2015-12-27 NOTE — Progress Notes (Signed)
HPI: FU coronary disease. Cardiac catheterization 2/05 showed mild irregularities in the left main. There was a 40% stenosis in the proximal LAD and a distal 30% lesion. The first diagonal had a 30-40% proximal lesion. The ostium of the LAD by intravascular ultrasound had a luminal area of 3.9 mm squared and percent stenosis 60%. She had nonobstructive disease in the left circumflex and right coronary. Her LV function was normal. An echocardiogram was also performed in December of 2010 and revealed normal LV function and mild left atrial enlargement. Nuclear study 1/16 showed EF 58 and normal perfusion. Since I last saw her, the patient has dyspnea with more extreme activities but not with routine activities. It is relieved with rest. It is not associated with chest pain. There is no orthopnea, PND or pedal edema. There is no syncope or palpitations. There is no exertional chest pain.   Current Outpatient Prescriptions  Medication Sig Dispense Refill  . albuterol (PROVENTIL HFA;VENTOLIN HFA) 108 (90 BASE) MCG/ACT inhaler Inhale 2 puffs into the lungs every 4 (four) hours as needed. For wheeze or shortness of breath 1 Inhaler 6  . aspirin 81 MG tablet Take 81 mg by mouth daily.      . ferrous sulfate 325 (65 FE) MG tablet Take 325 mg by mouth daily with breakfast.    . fluticasone (FLONASE) 50 MCG/ACT nasal spray Use as needed    . furosemide (LASIX) 40 MG tablet TAKE 1 TABLET (40 MG TOTAL) BY MOUTH DAILY. 90 tablet 0  . HYDROcodone-acetaminophen (NORCO/VICODIN) 5-325 MG per tablet Take 1 tablet by mouth TID prn for pain 90 tablet 0  . KLOR-CON M20 20 MEQ tablet TAKE 1 TABLET (20 MEQ TOTAL) BY MOUTH DAILY. 30 tablet 3  . Multiple Vitamin (MULTIVITAMIN) capsule Take 1 capsule by mouth daily.      Marland Kitchen omeprazole (PRILOSEC) 20 MG capsule Take 20 mg by mouth daily.    Marland Kitchen oxyCODONE-acetaminophen (PERCOCET/ROXICET) 5-325 MG per tablet TK 1-2 TS PO Q 6 H PRN P  0  . pravastatin (PRAVACHOL) 40 MG tablet  TAKE 1 TABLET BY MOUTH EVERY DAY 90 tablet 3  . traZODone (DESYREL) 100 MG tablet TAKE 1 TABLET BY MOUTH AT BEDTIME 30 tablet 3  . budesonide-formoterol (SYMBICORT) 160-4.5 MCG/ACT inhaler Inhale 2 puffs into the lungs 2 (two) times daily. 1 Inhaler 5   No current facility-administered medications for this visit.     Past Medical History  Diagnosis Date  . Allergic rhinitis   . Positive PPD   . COPD (chronic obstructive pulmonary disease) ([place])   . Hypertension   . CAD (coronary artery disease)   . Venous insufficiency   . Hypercholesteremia   . Obesity   . GERD (gastroesophageal reflux disease)   . Hx of colonic polyps   . DJD (degenerative joint disease)   . Arnold-Chiari malformation ([place])   . History of absence seizures   . Reflex sympathetic dystrophy   . Varicose veins   . Asthma     Past Surgical History  Procedure Laterality Date  . Abdominal hysterectomy    . Cspine surgery  1993    for arnold-chiari malformation  . Right knee arthroscopy  12/2007    Dr. Tonita Cong  . 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 Tonita Cong  . Joint replacement  06/05/11    Left total knee replacement    Social History   Social History  . Marital Status: Widowed  Spouse Name: N/A  . Number of Children: 2  . Years of Education: N/A   Occupational History  . retired    Social History Main Topics  . Smoking status: Former Smoker -- 0.30 packs/day for 12 years    Types: Cigarettes    Quit date: 01/02/2012  . Smokeless tobacco: Never Used     Comment: 1 pack per week  . Alcohol Use: No  . Drug Use: No  . Sexual Activity: Not on file   Other Topics Concern  . Not on file   Social History Narrative    Family History  Problem Relation Age of Onset  . Colon cancer Neg Hx   . Stomach cancer Neg Hx   . Heart disease Mother   . Stroke Mother   . Stroke Father   . Clotting disorder Father     ROS: Shoulder pain but no fevers or chills, productive cough, hemoptysis,  dysphasia, odynophagia, melena, hematochezia, dysuria, hematuria, rash, seizure activity, orthopnea, PND, pedal edema, claudication. Remaining systems are negative.  Physical Exam: Well-developed obese in no acute distress.  Skin is warm and dry.  HEENT is normal.  Neck is supple.  Chest is clear to auscultation with normal expansion.  Cardiovascular exam is regular rate and rhythm. 2/6 systolic murmur Abdominal exam nontender or distended. No masses palpated. Extremities show no edema. neuro grossly intact  ECG Sinus bradycardia at a rate of 54. Left ventricular hypertrophy. Nonspecific ST changes.

## 2015-12-31 ENCOUNTER — Encounter: Payer: Self-pay | Admitting: Cardiology

## 2015-12-31 ENCOUNTER — Ambulatory Visit (INDEPENDENT_AMBULATORY_CARE_PROVIDER_SITE_OTHER): Payer: BC Managed Care – PPO | Admitting: Cardiology

## 2015-12-31 VITALS — BP 110/80 | HR 54 | Ht 65.0 in | Wt 225.5 lb

## 2015-12-31 DIAGNOSIS — I2510 Atherosclerotic heart disease of native coronary artery without angina pectoris: Secondary | ICD-10-CM | POA: Diagnosis not present

## 2015-12-31 DIAGNOSIS — E7800 Pure hypercholesterolemia, unspecified: Secondary | ICD-10-CM

## 2015-12-31 DIAGNOSIS — I10 Essential (primary) hypertension: Secondary | ICD-10-CM | POA: Diagnosis not present

## 2015-12-31 NOTE — Assessment & Plan Note (Signed)
Continue ASA and statin  

## 2015-12-31 NOTE — Assessment & Plan Note (Signed)
Blood pressure controlled. Continue present medications. 

## 2015-12-31 NOTE — Assessment & Plan Note (Signed)
Continue statin. 

## 2015-12-31 NOTE — Patient Instructions (Signed)
Your physician wants you to follow-up in: ONE YEAR WITH DR CRENSHAW You will receive a reminder letter in the mail two months in advance. If you don't receive a letter, please call our office to schedule the follow-up appointment.   If you need a refill on your cardiac medications before your next appointment, please call your pharmacy.  

## 2016-02-11 ENCOUNTER — Telehealth: Payer: Self-pay | Admitting: Pulmonary Disease

## 2016-02-11 MED ORDER — AZITHROMYCIN 250 MG PO TABS: ORAL_TABLET | ORAL | Status: DC

## 2016-02-11 MED ORDER — HYDROCODONE-HOMATROPINE 5-1.5 MG/5ML PO SYRP: 5.0000 mL | ORAL_SOLUTION | Freq: Four times a day (QID) | ORAL | Status: DC | PRN

## 2016-02-11 NOTE — Telephone Encounter (Signed)
Spoke with patient in lobby, c/o cough and tickle in throat x 3 days, PND, runny nose and some wheezing.  Pt reports mucus is clear.  Unable to sleep d/t cough.  Denies fever, SOB and chest tightness.  ---- Spoke with SN: Give Hycodan Syrup (6oz) 1 tsp every 6 hours prn cough Zpak - as directed OTC Deslym - 2 tsp BID OTC Mucinex 600mg  - 2 BID ---- Scripts printed and given to SN to sign. Rx given to patient and written rec's.  Nothing further needed.

## 2016-02-15 ENCOUNTER — Telehealth: Payer: Self-pay | Admitting: Pulmonary Disease

## 2016-02-15 NOTE — Telephone Encounter (Signed)
Per 02/11/16 phone note: Spoke with SN: Give Hycodan Syrup (6oz) 1 tsp every 6 hours prn cough Zpak - as directed OTC Deslym - 2 tsp BID OTC Mucinex 600mg  - 2 BID ---  lmomtcb x1

## 2016-02-15 NOTE — Telephone Encounter (Signed)
Pt called back and made aware of below. She verbalized understanding and needed nothing further

## 2016-02-15 NOTE — Telephone Encounter (Signed)
Called spoke with pt. She reports she will finish ABX tomorrow. She reports she still has some coughing (white phlem) and PND. Denies any wheezing, chest tightness, no f/c/s/n/v.  Pt only has enough hycodan for today and already finished bottle of delsym. Please advise SN thanks  No Known Allergies   Current Outpatient Prescriptions on File Prior to Visit  Medication Sig Dispense Refill  . albuterol (PROVENTIL HFA;VENTOLIN HFA) 108 (90 BASE) MCG/ACT inhaler Inhale 2 puffs into the lungs every 4 (four) hours as needed. For wheeze or shortness of breath 1 Inhaler 6  . aspirin 81 MG tablet Take 81 mg by mouth daily.      Marland Kitchen azithromycin (ZITHROMAX) 250 MG tablet Take 2 today, then 1 daily until gone. 6 tablet 0  . budesonide-formoterol (SYMBICORT) 160-4.5 MCG/ACT inhaler Inhale 2 puffs into the lungs 2 (two) times daily. 1 Inhaler 5  . ferrous sulfate 325 (65 FE) MG tablet Take 325 mg by mouth daily with breakfast.    . fluticasone (FLONASE) 50 MCG/ACT nasal spray Use as needed    . furosemide (LASIX) 40 MG tablet TAKE 1 TABLET (40 MG TOTAL) BY MOUTH DAILY. 90 tablet 0  . HYDROcodone-acetaminophen (NORCO/VICODIN) 5-325 MG per tablet Take 1 tablet by mouth TID prn for pain 90 tablet 0  . HYDROcodone-homatropine (HYCODAN) 5-1.5 MG/5ML syrup Take 5 mLs by mouth every 6 (six) hours as needed for cough. 180 mL 0  . KLOR-CON M20 20 MEQ tablet TAKE 1 TABLET (20 MEQ TOTAL) BY MOUTH DAILY. 30 tablet 3  . Multiple Vitamin (MULTIVITAMIN) capsule Take 1 capsule by mouth daily.      Marland Kitchen omeprazole (PRILOSEC) 20 MG capsule Take 20 mg by mouth daily.    Marland Kitchen oxyCODONE-acetaminophen (PERCOCET/ROXICET) 5-325 MG per tablet TK 1-2 TS PO Q 6 H PRN P  0  . pravastatin (PRAVACHOL) 40 MG tablet TAKE 1 TABLET BY MOUTH EVERY DAY 90 tablet 3  . traZODone (DESYREL) 100 MG tablet TAKE 1 TABLET BY MOUTH AT BEDTIME 30 tablet 3   No current facility-administered medications on file prior to visit.

## 2016-02-15 NOTE — Telephone Encounter (Signed)
Pt returned call - [phone] or [phone]

## 2016-02-15 NOTE — Telephone Encounter (Signed)
Per SN: Sounds like she has improved. Can continue the delsym BID, use throat lozenges. Do not want to refill the hycodan at this time and want to stay off the narcotic cough syrup at this time --  [facility] x1 for pt

## 2016-02-23 ENCOUNTER — Other Ambulatory Visit: Payer: Self-pay | Admitting: Pulmonary Disease

## 2016-03-04 ENCOUNTER — Other Ambulatory Visit: Payer: Self-pay | Admitting: Pulmonary Disease

## 2016-03-08 ENCOUNTER — Other Ambulatory Visit: Payer: Self-pay | Admitting: Pulmonary Disease

## 2016-05-06 ENCOUNTER — Other Ambulatory Visit: Payer: Self-pay | Admitting: *Deleted

## 2016-05-06 MED ORDER — POTASSIUM CHLORIDE CRYS ER 20 MEQ PO TBCR: EXTENDED_RELEASE_TABLET | ORAL | Status: DC

## 2016-05-19 ENCOUNTER — Encounter: Payer: Self-pay | Admitting: Sports Medicine

## 2016-05-19 ENCOUNTER — Ambulatory Visit (INDEPENDENT_AMBULATORY_CARE_PROVIDER_SITE_OTHER): Payer: BC Managed Care – PPO | Admitting: Sports Medicine

## 2016-05-19 DIAGNOSIS — B351 Tinea unguium: Secondary | ICD-10-CM

## 2016-05-19 DIAGNOSIS — Q828 Other specified congenital malformations of skin: Secondary | ICD-10-CM

## 2016-05-19 DIAGNOSIS — M79673 Pain in unspecified foot: Secondary | ICD-10-CM | POA: Diagnosis not present

## 2016-05-19 DIAGNOSIS — M21619 Bunion of unspecified foot: Secondary | ICD-10-CM

## 2016-05-19 DIAGNOSIS — Z89422 Acquired absence of other left toe(s): Secondary | ICD-10-CM

## 2016-05-19 NOTE — Progress Notes (Signed)
Patient ID: EDID FUSTER, female   DOB: 1940-12-21, 75 y.o.   MRN: [id number] Subjective: [name] is a 75 y.o. female patient who presents to office for evaluation of Left>right foot pain secondary to callus skin. Patient complains of pain at the lesion present Left hallux and bunion site and right hallux distal tuft. Patient has tried cream with no relief in symptoms. Patient denies any other pedal complaints.   Patient Active Problem List   Diagnosis Date Noted  . Right arm pain 05/16/2015  . Generalized anxiety disorder 08/31/2014  . 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 Edema 08/07/2011  . MENORRHAGIA, POSTMENOPAUSAL 08/13/2010  . CIGARETTE SMOKER 06/11/2010  . POSITIVE PPD 06/10/2010  . CARDIAC MURMUR 11/02/2009  . BRADYCARDIA 11/01/2009  . CHEST PAIN 11/01/2009  . ALLERGIC RHINITIS 04/19/2009  . Chronic obstructive airway disease with asthma ([place]) 04/18/2009  . LEG CRAMPS, NOCTURNAL 11/02/2008  . 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 Essential hypertension 01/17/2008  . COLONIC POLYPS 01/14/2008  . HYPERCHOLESTEROLEMIA 01/14/2008  . Obesity 01/14/2008  . REFLEX SYMPATHETIC DYSTROPHY 01/14/2008  . Coronary atherosclerosis 01/14/2008  . Venous (peripheral) insufficiency 01/14/2008  . GERD 01/14/2008  . Osteoarthritis 01/14/2008  . BACK PAIN, LUMBAR 01/14/2008  . SEIZURES, HX OF 01/14/2008    Current Outpatient Prescriptions on File Prior to Visit  Medication Sig Dispense Refill  . albuterol (PROVENTIL HFA;VENTOLIN HFA) 108 (90 BASE) MCG/ACT inhaler Inhale 2 puffs into the lungs every 4 (four) hours as needed. For wheeze or shortness of breath 1 Inhaler 6  . aspirin 81 MG tablet Take 81 mg by mouth daily.      Marland Kitchen azithromycin (ZITHROMAX) 250 MG tablet Take 2 today, then 1 daily until gone. 6 tablet 0  . budesonide-formoterol (SYMBICORT) 160-4.5 MCG/ACT inhaler Inhale 2 puffs into the lungs 2 (two) times daily. 1 Inhaler 5  .  ferrous sulfate 325 (65 FE) MG tablet Take 325 mg by mouth daily with breakfast.    . fluticasone (FLONASE) 50 MCG/ACT nasal spray Use as needed    . furosemide (LASIX) 40 MG tablet TAKE 1 TABLET (40 MG TOTAL) BY MOUTH DAILY. 90 tablet 0  . HYDROcodone-acetaminophen (NORCO/VICODIN) 5-325 MG per tablet Take 1 tablet by mouth TID prn for pain 90 tablet 0  . HYDROcodone-homatropine (HYCODAN) 5-1.5 MG/5ML syrup Take 5 mLs by mouth every 6 (six) hours as needed for cough. 180 mL 0  . Multiple Vitamin (MULTIVITAMIN) capsule Take 1 capsule by mouth daily.      Marland Kitchen omeprazole (PRILOSEC) 20 MG capsule Take 20 mg by mouth daily.    Marland Kitchen oxyCODONE-acetaminophen (PERCOCET/ROXICET) 5-325 MG per tablet TK 1-2 TS PO Q 6 H PRN P  0  . potassium chloride SA (KLOR-CON M20) 20 MEQ tablet TAKE 1 TABLET (20 MEQ TOTAL) BY MOUTH DAILY. 90 tablet 1  . pravastatin (PRAVACHOL) 40 MG tablet TAKE 1 TABLET BY MOUTH EVERY DAY 90 tablet 3  . traZODone (DESYREL) 100 MG tablet TAKE 1 TABLET BY MOUTH AT BEDTIME 30 tablet 3   No current facility-administered medications on file prior to visit.    No Known Allergies  Objective:  General: Alert and oriented x3 in no acute distress  Dermatology: Keratotic lesions present right hallux distal tuft, left hallux and 1st MTPJ with skin lines transversing the lesion, pain is present with direct pressure to the lesions with a central nucleated core noted, no webspace macerations, no ecchymosis bilateral, nails elongated and dystrophic with subungal  debris.  Vascular: Dorsalis Pedis and Posterior Tibial pedal pulses 1/4, Capillary Fill Time 3 seconds, + scant pedal hair growth bilateral, no edema bilateral lower extremities, Temperature gradient within normal limits.  Neurology: Johney Maine sensation intact via light touch bilateral.  Musculoskeletal: Mild tenderness with palpation at the keratotic lesions site on Left>right foot, Muscular strength 5/5 in all groups without pain or limitation on  range of motion. Bunion Left>Right. 2nd toe amputation status on left that is causing bunion to be more progressive with keratosis.   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  Landis Martins, DPM

## 2016-06-19 ENCOUNTER — Encounter: Payer: Self-pay | Admitting: Pulmonary Disease

## 2016-06-19 ENCOUNTER — Ambulatory Visit (INDEPENDENT_AMBULATORY_CARE_PROVIDER_SITE_OTHER): Payer: BC Managed Care – PPO | Admitting: Pulmonary Disease

## 2016-06-19 VITALS — BP 120/74 | HR 58 | Temp 97.8°F | Ht 65.0 in | Wt 226.0 lb

## 2016-06-19 DIAGNOSIS — J45909 Unspecified asthma, uncomplicated: Secondary | ICD-10-CM

## 2016-06-19 DIAGNOSIS — I872 Venous insufficiency (chronic) (peripheral): Secondary | ICD-10-CM

## 2016-06-19 DIAGNOSIS — R252 Cramp and spasm: Secondary | ICD-10-CM

## 2016-06-19 DIAGNOSIS — E7800 Pure hypercholesterolemia, unspecified: Secondary | ICD-10-CM | POA: Diagnosis not present

## 2016-06-19 DIAGNOSIS — J449 Chronic obstructive pulmonary disease, unspecified: Secondary | ICD-10-CM | POA: Diagnosis not present

## 2016-06-19 DIAGNOSIS — E669 Obesity, unspecified: Secondary | ICD-10-CM

## 2016-06-19 DIAGNOSIS — I10 Essential (primary) hypertension: Secondary | ICD-10-CM

## 2016-06-19 DIAGNOSIS — M150 Primary generalized (osteo)arthritis: Secondary | ICD-10-CM

## 2016-06-19 DIAGNOSIS — M159 Polyosteoarthritis, unspecified: Secondary | ICD-10-CM

## 2016-06-19 DIAGNOSIS — R609 Edema, unspecified: Secondary | ICD-10-CM

## 2016-06-19 DIAGNOSIS — K219 Gastro-esophageal reflux disease without esophagitis: Secondary | ICD-10-CM

## 2016-06-19 DIAGNOSIS — M8949 Other hypertrophic osteoarthropathy, multiple sites: Secondary | ICD-10-CM

## 2016-06-19 MED ORDER — BUDESONIDE-FORMOTEROL FUMARATE 160-4.5 MCG/ACT IN AERO: 2.0000 | INHALATION_SPRAY | Freq: Two times a day (BID) | RESPIRATORY_TRACT | Status: DC

## 2016-06-19 MED ORDER — TRAMADOL HCL 50 MG PO TABS: ORAL_TABLET | ORAL | Status: DC

## 2016-06-19 NOTE — Progress Notes (Signed)
Subjective:    Patient ID: [name], female    DOB: [birth year], 75 y.o.   MRN: [id number]  HPI 75 y/o BF here for a follow up of her mult med problems including: Allergies, HBP, CAD, Chol, Obesity, etc... she is also followed by DrCrenshaw for Cards, & DrESL for allergies... ~  SEE PREV EPIC NOTES FOR OLDER DATA >>    CXR 01/12/12>  Shallow inspiration, borderline cardiomeg & vasc congestion, tortuous & ectatic Ao, no focal airsp dis, post-op changes in right shoulder...  LABS 8/13:  FLP- at goals on Prav40;  Chems- all wnl;  CBC- ok w/ Hg=11.8 MCV=81 Fe=49 (12%sat);  TSH=1.38... rec to start FeSo4 daily.   LABS 8/14:  FLP- at goals on Prav40;  Chems- wnl;  CBC- Hg=11.3, Fe=47 (14%sat);  TSH= 0.96;  VitD= 48...  LABS 10/15:  FLP- at goals on Prav40;  Chems- wnl;  CBC- ok w/ Hg=12.0 MCV=81 Fe=70(20%) & rec to continue Fe daily;  TSH=0.81...  ~  May 16, 2015:  4mo ROV & Verdia presents w/ c/o tingling in her right arm and left shoulder- sl painful w/ ROM & we discussed Rx w/ Mobic and referral to Ortho for further eval... She also c/o incr leg edema- she is on Lasix40 & reminded of the importance of low sodium diet... We reviewed the following medical problems during today's office visit >>     AR> she sees DrVanWinkle for allergies and "asthma" on shots once per month now...    COPD> ex-smoker, hx pos PPD, AR> on Symbicort160, Proair prn, Flonase prn; she says she quit smoking end of 2013- denies cough, sput, hemoptysis, ch in dyspnea, etc...    HBP> on Lasix40, K20; BP= 114/70 & she denies CP, palpit, dizzy, SOB but notes worsening edema as above=> rec incr Lasix to 1-2 tabs Qam.    CAD> on ASA81; followed by DrCrenshaw & seen 1/16- stable & f/u Myoview 1/16 showed no symptoms or EKG changes, no evid of ischemia, norm wall motion, EF=58%, NEG study...    VI> she had laser ablation of left greater saphenous vein 2013 (painful varicosities) by Sheryn Bison; f/u Venous Dopplers 4/14 showed no evid  of DVT...    Chol> on Prav40; FLP 10/15 looks good w/ TChol 125, TG 112, HDL 53, LDL 50    Overweight> wt= 231# which is up 2#, w/ BMI= 39 & she has a long way to go...    GI- GERD, Polyps, known gallstones> on Omep20; she denies abd pain, dysphagia, n/v, c/d, blood seen...    DJD, s/p bilat TKRs, LBP> on Vicodin prn; managed by Ortho- DrBeane & we do not have notes from him=> try Mobic & refer back to ortho for arm complaints...    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    Arnold Chiari malformation & hx seizures> she had a suboccipital craniectomy and C1 laminectomy for Arnold-Chiari malformation by DrKritzer in 1993.     Insomnia> prev on Ambien10 prn, now on Desyrel100mg  qhs...  We reviewed prob list, meds, xrays and labs> see below for updates >>  IMP/PLAN>>  We discussed transient incr Lasix to 1-2 tabs qam depending on edema, reminded  Of low sodium etc;  We will refer to Ortho for arm complaints for eval;  Continue other meds and ROV in 68mo w/ fasting labs...  ~  December 21, 2015:  24mo ROV & [name] reports recent eval by allergist- DrVanwinkle, with incr allergy symptoms- treated  w/ shot+meds (cefdinir) "it was misery" now better she says;  She has also had some left shoulder pain, eval by DrBeane (we do not have notes) and MRI pending soon;  Prev c/o legs cramping & improved w/ [facility] OTC cramp pill + Mustard...  See prob list above>    On Symbicort160-2spBid + AlbutHFA rescue & Flonase if needed (ave 1-2 per month)> breathing is good she says...    On ASA81, Lasix40, K20> she has VI & periph edema, reminded to elim sodium Etc... Labs are wnl...    On Prav40 & FLP today shows TChol 156, TG 76, HDL 76, LDL 65    She is obese- on diet + exercise;  She has DJD & needs to incr exercise program as well... EXAM shows Afeb, VSS, O2sat=100% on RA at rest;  W231#, BMI=38-9; HEENT- neg, mallampati3;  Chest- decr BS bilat, clear w/o w/r/r;  Heart- RR gr2/6 SEM w/o r/g;  Abd- obese,  soft, neg;  Ext- +VI, 1+edema;  Neuro- intact w/o focal deficits...  LABS 12/21/15>  FLP- all parameters at goals on Prav40;  Chems- wnl;  CBC- ok w/ Hg=12.1, Fe=89;  TSH=1.91... IMP/PLAN>>  Stable on meds, continue same; needs diet 7 exercise; she had the 2016 Flu vaccine; call for any problems, ROV 73mo...  ~  June 19, 2016:  51mo [facility]' CC revolves around her arthritis, shoulders and cramps in her legs;  She uses OTC Aleve as needed & has Vicodin for prn use as well, today I wrote for Tramadol too... We reviewed the following medical problems during today's office visit >>     AR> she sees DrVanWinkle for allergies and "asthma" on shots once per month now...    COPD> ex-smoker, hx pos PPD, AR> on Symbicort160, Proair prn, Flonase prn; she says she quit smoking end of 2013- denies cough, sput, hemoptysis, ch in dyspnea, etc...    HBP> on Lasix40 prn edema (takes it qod), K20; BP= 120/74 & she denies CP, palpit, dizzy, SOB but notes worsening edema as above=> rec incr Lasix40 to more regular dosing.    CAD> on ASA81; followed by DrCrenshaw & seen 1/17- known CAD- stable & f/u Myoview 1/16 showed no symptoms or EKG changes, no evid of ischemia, norm wall motion, EF=58%, NEG study; rec to continue ASA, statin, & prn Lasix.    VI> she had laser ablation of left greater saphenous vein 2013 (painful varicosities) by Sheryn Bison; f/u Venous Dopplers 4/14 showed no evid of DVT; known VI & edema- on Lasix prn & she takes it ~QOD...    Chol> on Prav40; FLP 1/17 looks good w/ TChol 156, TG 76, HDL 76, LDL 65    Overweight> wt= 226# which is down 5# w/ BMI= 39 & she has a long way to go...    GI- GERD, Polyps, known gallstones> on Omep20; she denies abd pain, dysphagia, n/v, c/d, blood seen...    DJD, s/p bilat TKRs, LBP> on Aleve & Vicodin prn; managed by Ortho- DrBeane & we do not have notes from him=> try TRAMADOL50 prn; she also sees Podiatry for callouses and nails...    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    Arnold Chiari malformation & hx seizures> she had a suboccipital craniectomy and C1 laminectomy for Arnold-Chiari malformation by DrKritzer in 1993.     Insomnia> on Desyrel100mg  qhs... For nocturnal leg cramps- try mustard & tonic water. EXAM shows Afeb, VSS, Wt=226#, BMI=38;  HEENT-  neg, mallamati2;  Chest- decr BS bilat w/ few scat rhonchi at bases, no wheezing or rales;  Heart- RR gr1/6 SEM w/o r/g;  Abd- obese soft nontender;  Ext- VI, 1+edema... IMP/PLAN>>  We discussed mult issues-- take Tramadol, Aleve, Vicodin for pain/ arthritis;  Try the mustard, tonic water for the noct leg cramps; use the Lasix 40Qd-Qod for edema + no salt; get wt down!  we will continue our 7mo follow up visits...           Problem List:  ALLERGIC RHINITIS (ICD-477.9) > on NASONEX, ASTEPRO, ZYRTEK, & allergy shots- followed by DrVanWinkle... POSITIVE PPD (ICD-795.5) CIGARETTE SMOKER CHRONIC OBSTRUCTIVE ASTHMA UNSPECIFIED (ICD-493.20) - she had +allergy skin testing for dust mites and started on allergy shots per DrESL along w/ SYMBICORT 160- 2spBid, but she doesn't take anything regularly... +smoker but decreased from 1/2 ppd x 78yrs to 1-2 cig/d. ~  2/11: she reports +PPD on pre-employment testing> sent to health dept, CXR- neg, & started on INH 300mg /d x76mo (they never sent records for [place] to review). ~  CXR 2/11 showed borderline heart size, tort Ao, sl incr markings, NAD... ~  subseq CXRs done at health dept for her pos PPD but we don't have the films or reports... ~  2/13: Refractory AB treated via ER w/ Doxy, Pred, restart Symbicort, +Mucinex, etc; we decided upon Levaquin, Medrol, MMW... ~  CXR 2/13 showed mild cardiomeg, ectatic Ao & coronary calcif, DJD sp, NAD; CTAngio was neg for PE, +bronchial wall thickening, NAD.Marland Kitchen. ~  8/13: she tells me "I saw my asthma doctor last week- doing OK" no changes in meds from DrVanWinkle... ~  2/14: on Symbicort160, Proair prn, Nasonex prn;  she says she quit smoking 77mo ago & she is encouraged to remain quit! She denies cough, sput, hemoptysis, ch in dyspnea, etc. ~  8/14: Hx COPD> ex-smoker, hx pos PPD, AR> on Symbicort160, Proair prn, Nasonex prn; she says she quit smoking end of 2013- denies cough, sput, hemoptysis, ch in dyspnea, etc. ~  10/15: on Symbicort160, Proair prn, Flonase prn; she says she quit smoking end of 2013- denies cough, sput, hemoptysis, ch in dyspnea, etc; still gets alergy shots once per month now...  Hx of HYPERTENSION (ICD-401.9) - off Lisinopril/Hct (Hx ACE reaction) & taking LASIX 40mg  as needed for swelling... ~  EKG 6/12 showed SBrady w/ rate= 55, otherw WNL, NAD.Marland Kitchen. ~  5/13:  BP=120/78 today and denies HA, fatigue, visual changes, CP, palipit, dizziness, syncope, dyspnea, edema, etc... ~  8/13:  BP= 124/82 & she remains largely asymtomatic... ~  2/14: on Lasix40, K20; BP= 122/80 & she denies CP, palpit, dizzy, SOB, worsening edema.  ~  8/14: on Lasix40, K20; BP= 108/70 & she remains asymptomatic... ~  10/15: on Lasix40, K20; BP= 112/80 & she denies CP, palpit, dizzy, SOB, worsening edema  CAD (ICD-414.00) - known non-obstructive disease & atypic CP... on ASA 325mg /d, STATIN (Prav40), & off ACE. ~  cath 2/05 showed 20-60% obstructions in all 3 vessels... good LVF... ~  NuclearStressTest 3/09 was negative- no ischemia or infarction, EF=68%... ~  Eval for recurrent CP- EKG showed NSR, NAD;  2DEcho showed mild focal basal septal hypertrophy, norm wall motion w/ EF= 55-65%, mild DD & PAsys= 37;  Myoview NEG- no scar, no ischemia, EF=64%... ~  ROV w/ DrCrenshaw 7/11 reviewed- continue ASA/ Statin/ smoking cessation/ monitor BP & Chol. ~  She saw DrCrenshaw 1/13 for f/u CAD> last Myoview & 2DEcho were in 2010; she's  been stable on ASA81mg /d, Lasix, KCl, Prav40; she denies angina & DrCrenshaw rec quit smoking, same meds, lose weight... Note> CTA 2/13 showed atherosclerotic changes seen in Ao & coronaries, mild  cardiomegaly ~  She had yearly f/u DrCrenshaw 1/14> felt to be stable, no changes made;  EKG showed SBrady, rate 49, NSSTTWA... ~  on ASA81; followed by DrCrenshaw & seen 1/15- stable & no changes made...  VENOUS INSUFFICIENCY (ICD-459.81) - she follows a low salt diet... +LASIX 40mg /d for swelling... ~  1/10: we discussed no salt, Lasix20, elevation, support hose. ~  She was evaluated by Sheryn Bison [facility] w/ severe ven insuffic left leg w/ pain & chr swelling, bulging varicosities; using compression hose, no salt, elevation, & Ibuprofen w/o improvement;  He plans laser ablation of the GSV & then he'll consider stab phlebectomy of secondary varicosities later... ~  4/13: s/p left GSV laser ablation; she is feeling better w/ decr pain etc after the procedure... ~  5/13:  F/u VenDopplers by [facility] w/ good ablation, no evid DVT etc... ~  4/14:  She had f/u DrLawson> stable, no DVT, rec compression hose...  HYPERCHOLESTEROLEMIA (ICD-272.0) - on PRAVASTATIN 40mg /d now + diet efforts. ~  FLP 5/08 on Cres5 showed TChol 115, TG 83, HDL 50, LDL 49... tolerating well and taking med regularly. ~  [place] 3/09 on Cres5 showed TChol 126, TG 104, HDL 56, LDL 49... rec- same. ~  FLP 3/10 on Cres5 showed TChol 152, TG 97, HDL 77, LDL 55 ~  FLP 12/10 on Cres5 showed TChol 127, TG 100, HDL 52, LDL 55 ~  7/11:  pt requests change to less expensive statin> try PRAVASTATIN40 ~  FLP 10/11 on Prav40 showed TChol 143, TG 125, HDL 51, LDL 67... Continue same. ~  FLP 6/12 on Prav40 showed TChol 143, TG 145, HDL 61, LDL 53 ~  FLP 8/13 on Prav40 showed TChol 141, TG 92, HDL 63, LDL 60 ~  FLP 8/14 on Prav40 showed TChol 133, TG 101, HDL 54, LDL 59 ~  FLP 10/15 on Prav40 showed TChol 125, TG 112, HDL 53, LDL 50  OBESITY (ICD-278.00) - unable to diet effectively and get weight down...  ~  weight 5/10 = 238#, 5\' 5"  tall,  BMI= 40... we discussed diet and exercise program... again! ~  weight 1/11 = 228# ~  weight 7/11 = 232# ~   weight 10/11 = 230# ~  Weight 6/12 = 232# ~  Weight 10/12 = 234# ~  Weight 2/13 = 241# ~  Weight 4/13 = 248#... She is asked to get wt down!!! ~  Weight 8/13 = 234#... Great job! ~  [employer] 2/14 = 232# ~  Weight 8/14 = 230# ~  Weight 10/15 = 229#  GERD (ICD-530.81) - EGD 7/05 by DrStark w/ HH- supposed to be on PPI (Omeprazole 20mg ) & H2 blocker (Pepcid 20mg ) at bedtime... ~  2/14: she takes the Omep20 daily but using the Zantac as needed...  COLONIC POLYPS (ICD-211.3) - last colonoscopy was 12/03 showing several 3-6mm polyps (hyperplastic)...  HX OF GALLSTONE (ICD-V12.79) - seen on CT 7/06 and referred to [facility]- eval by DrCornett and being followed...  DEGENERATIVE JOINT DISEASE (ICD-715.90) - followed by DrBeane for ortho... Right knee arthroscopy 1/09 without help... s/p right TKR 06/12/08 by [facility]... 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 she is off the Etodolac & takes PERCOCET Prn... ~  1/11: now complaining of left knee pain & will f/u w/ DrBeane... ~  6/12:  DrBeane plans left TKR soon... OK for surg... ~  7/12: s/p left TKR per DrBeane & went to [facility] for rehab... ~  8/13:  She requests refill MOBIC 15mg  as needed... ~  10/15: on Vicodin prn, we do not have notes from Ortho...  BACK PAIN, LUMBAR (ICD-724.2) >> presented 4/13 w/ pain in left side of back & radiating down leg leg... Given Vicodin, anti-inflamm med & muscle relaxer;  Referred to Ortho for further eval==> ?if she ever saw Ortho, pain resolved w/ Mobic, Norco, rest, heat, etc...  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   Past Surgical History  Procedure Laterality Date  . Abdominal hysterectomy    . Cspine surgery  1993    for  arnold-chiari malformation  . Right knee arthroscopy  12/2007    Dr. Tonita Cong  . 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 Tonita Cong  . Joint replacement  06/05/11    Left total knee replacement    Outpatient Encounter Prescriptions as of 06/19/2016  Medication Sig  . albuterol (PROVENTIL HFA;VENTOLIN HFA) 108 (90 BASE) MCG/ACT inhaler Inhale 2 puffs into the lungs every 4 (four) hours as needed. For wheeze or shortness of breath  . aspirin 81 MG tablet Take 81 mg by mouth daily.    . ferrous sulfate 325 (65 FE) MG tablet Take 325 mg by mouth daily with breakfast.  . furosemide (LASIX) 40 MG tablet TAKE 1 TABLET (40 MG TOTAL) BY MOUTH DAILY.  Marland Kitchen HYDROcodone-acetaminophen (NORCO/VICODIN) 5-325 MG per tablet Take 1 tablet by mouth TID prn for pain  . Multiple Vitamin (MULTIVITAMIN) capsule Take 1 capsule by mouth daily.    Marland Kitchen omeprazole (PRILOSEC) 20 MG capsule Take 20 mg by mouth daily.  . potassium chloride SA (KLOR-CON M20) 20 MEQ tablet TAKE 1 TABLET (20 MEQ TOTAL) BY MOUTH DAILY.  . pravastatin (PRAVACHOL) 40 MG tablet TAKE 1 TABLET BY MOUTH EVERY DAY  . traZODone (DESYREL) 100 MG tablet TAKE 1 TABLET BY MOUTH AT BEDTIME  . budesonide-formoterol (SYMBICORT) 160-4.5 MCG/ACT inhaler Inhale 2 puffs into the lungs 2 (two) times daily.  . [DISCONTINUED] azithromycin (ZITHROMAX) 250 MG tablet Take 2 today, then 1 daily until gone. (Patient not taking: Reported on 06/19/2016)  . [DISCONTINUED] fluticasone (FLONASE) 50 MCG/ACT nasal spray Reported on 06/19/2016  . [DISCONTINUED] HYDROcodone-homatropine (HYCODAN) 5-1.5 MG/5ML syrup Take 5 mLs by mouth every 6 (six) hours as needed for cough. (Patient not taking: Reported on 06/19/2016)  . [DISCONTINUED] oxyCODONE-acetaminophen (PERCOCET/ROXICET) 5-325 MG per tablet Reported on 06/19/2016    No Known Allergies   Current Medications, Allergies, Past Medical History, Past Surgical History, Family History, and Social History were reviewed in [employer] record.    Review of Systems         See HPI - all other systems neg except as noted... The patient complains of dyspnea on exertion & gait abn.  The patient denies anorexia, fever, weight loss, weight gain, vision loss, decreased hearing, hoarseness, chest pain, syncope, worsening edema, prolonged cough, headaches, hemoptysis, abdominal pain, melena, hematochezia, severe indigestion/heartburn, hematuria, incontinence, muscle weakness, suspicious skin lesions, transient blindness, depression, unusual weight change, abnormal bleeding, enlarged lymph nodes, and angioedema.     Objective:   Physical Exam  WD, Overweight, 75 y/o BF in NAD... GENERAL:  Alert & oriented; pleasant & cooperative... HEENT:  /AT, EOM-wnl, PERRLA, EACs-clear, TMs-wnl, NOSE-clear, THROAT-clear & wnl, no lesions seen; VOICE- sl hoarse. NECK:  Supple w/ decrROM; no JVD; normal carotid impulses w/o bruits; no thyromegaly or nodules palpated; no lymphadenopathy. CHEST:  Few scat rhonchi, mild congestion & exp wheezing, no consolidation... HEART:  Regular Rhythm, gr 1/6 SEM without rubs or gallops... ABDOMEN:  Obese, soft & nontender; normal bowel sounds; no organomegaly or masses detected. EXT:  s/p right TKR, mod arthritic changes; +varicose veins (s/p laser ablation left GSV 4/13)/ +venous insuffic/ 1+ edema... NEURO:  CN's intact; no focal neuro deficits... DERM:  No lesions noted; no rash etc...  RADIOLOGY DATA:  Reviewed in the EPIC EMR & discussed w/ the patient...  LABORATORY DATA:  Reviewed in the EPIC EMR & discussed w/ the patient...   Assessment & Plan:    Hx Asthma/ COPD/ AB, smoker (?ex), & hx pos PPD- treated> She's had refractory "attack" & treated in past w/ Levaquin 500mg /d, Medrol dosepak, Mucinex 2Bid, MMW, etc; improved off cigs & on Symbicort...  HBP>  BP controlled on diet + Lasix Rx...  CAD>  Denies CP, angina, etc;  On ASA, statin, off cigs, etc...  Ven Insuffic>   On sodium restriction, elevation, support hose; also on LASIX to 40mg /d & followed by [facility] & s/p laser ablation of left GSV- improved.  CHOL>  Back on Prav40 & FLP is stable...  OBESITY>  Needs better diet & hopefully incr exerc after the knee surg...  GI>  GERD, Polyps, Gallstone> follwed by DrStark & stable on Omep20 but uses prn only...  S/P left TKR 7/12 by DrBeane>  Course complic by bleed into left calf; now stable & improving w/ PT etc...  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 She will need Ortho referral for further eval...  Other medical issues as noted...   Patient's Medications  New Prescriptions   TRAMADOL (ULTRAM) 50 MG TABLET    Take 1 tablet by mouth up to every 8 hours as needed for pain  Previous Medications   ALBUTEROL (PROVENTIL HFA;VENTOLIN HFA) 108 (90 BASE) MCG/ACT INHALER    Inhale 2 puffs into the lungs every 4 (four) hours as needed. For wheeze or shortness of breath   ASPIRIN 81 MG TABLET    Take 81 mg by mouth daily.     FERROUS SULFATE 325 (65 FE) MG TABLET    Take 325 mg by mouth daily with breakfast.   FUROSEMIDE (LASIX) 40 MG TABLET    TAKE 1 TABLET (40 MG TOTAL) BY MOUTH DAILY.   HYDROCODONE-ACETAMINOPHEN (NORCO/VICODIN) 5-325 MG PER TABLET    Take 1 tablet by mouth TID prn for pain   MULTIPLE VITAMIN (MULTIVITAMIN) CAPSULE    Take 1 capsule by mouth daily.     OMEPRAZOLE (PRILOSEC) 20 MG CAPSULE    Take 20 mg by mouth daily.   POTASSIUM CHLORIDE SA (KLOR-CON M20) 20 MEQ TABLET    TAKE 1 TABLET (20 MEQ TOTAL) BY MOUTH DAILY.   PRAVASTATIN (PRAVACHOL) 40 MG TABLET    TAKE 1 TABLET BY MOUTH EVERY DAY   TRAZODONE (DESYREL) 100 MG TABLET    TAKE 1 TABLET BY MOUTH AT BEDTIME  Modified Medications   Modified Medication Previous Medication   BUDESONIDE-FORMOTEROL (SYMBICORT) 160-4.5 MCG/ACT INHALER  budesonide-formoterol (SYMBICORT)  160-4.5 MCG/ACT inhaler      Inhale 2 puffs into the lungs 2 (two) times daily.    Inhale 2 puffs into the lungs 2 (two) times daily.  Discontinued Medications   AZITHROMYCIN (ZITHROMAX) 250 MG TABLET    Take 2 today, then 1 daily until gone.   FLUTICASONE (FLONASE) 50 MCG/ACT NASAL SPRAY    Reported on 06/19/2016   HYDROCODONE-HOMATROPINE (HYCODAN) 5-1.5 MG/5ML SYRUP    Take 5 mLs by mouth every 6 (six) hours as needed for cough.   OXYCODONE-ACETAMINOPHEN (PERCOCET/ROXICET) 5-325 MG PER TABLET    Reported on 06/19/2016

## 2016-06-19 NOTE — Progress Notes (Signed)
Subjective:    Patient ID: [name], female    DOB: December 23, 1940, 75 y.o.   MRN: [id number]  HPI 75 y/o BF here for a follow up of her mult med problems including: Allergies, HBP, CAD, Chol, Obesity, etc... she is also followed by DrCrenshaw for Cards, & DrESL for allergies...  ~  July 29, 2012:  6mo ROV & [name] notes that her prev back discomfort is better w/ prn Mobic & Vicodin (requests refills today)... Now c/o cramps in her legs & wonders if she needs to incr her potassium (we will check level=3.8);  She has Lasix40 but choses to take it intermittently noting that she doesn't pee as much if she takes one daily vis-a-vis 2 Qod;  Still on the K20 taking one daily;  Weight is down 14# to 234# today...    She had f/u VenDopplers by [facility] 5/13> no evid DVT, left GSV ablated, other VV noted; no reflux seen & the fitted her for knee high compression hose but they are "too hard to wear every day"...    We reviewed prob list, meds, xrays and labs> see below for updates >>  LABS 8/13:  FLP- at goals on Prav40;  Chems- all wnl;  CBC- ok w/ Hg=11.8 MCV=81 Fe=49 (12%sat);  TSH=1.38... rec to start FeSo4 daily.   ~  January 28, 2013:  42mo ROV & her CC is leg discomfort for which she has been eval by Alpha Gula- we do not have his notes but she reports XRays OK & she reports that steroid pack x8d really helped... We reviewed the following medical problems during today's office visit >>     COPD> smoker, hx pos PPD, AR> on Symbicort160, Proair prn, Nasonex prn; she says she quit smoking 71mo ago & she is encouraged to remain quit! She denies cough, sput, hemoptysis, ch in dyspnea, etc...    HBP> on Lasix40, K20; BP= 122/80 & she denies CP, palpit, dizzy, SOB, worsening edema...    CAD> on ASA81; followed by drCrenshaw & seen 1/14- stable & no changes made...    VI> she knows to elim sodium, elev legs, wear support hose, & take the Lasix40...    Chol> on Prav40; last FLP 8/13 looked good w/ TChol 141, TG  92, HDL 63, LDL 60    Overweight> wt= 232# which is down 2#, but her BMI is 39 & she has a long way to go...    GI- GERD, Polyps, known gallstones> on Omep20; she denies abd pain, dysphagia, n/v, c/d, blood seen...    DJD, s/p left TKR, LBP> on Mobic15, Aleve prn, Vicodin prn; managed by Ortho- DrBeane...    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    Arnold Chiari malformation & hx seizures> she had a suboccipital craniectomy and C1 laminectomy for Arnold-Chiari malformation by DrKritzer in 1993.     Insomnia> on Ambien10 prn... We reviewed prob list, meds, xrays and labs> see below for updates >>   ~  July 28, 2013:  73mo ROV & [name] is c/o "old age" and "aches & pains"...  We reviewed the following medical problems during today's office visit >>     COPD> ex-smoker, hx pos PPD, AR> on Symbicort160, Proair prn, Nasonex prn; she says she quit smoking end of 2013- denies cough, sput, hemoptysis, ch in dyspnea, etc...    HBP> on Lasix40, K20; BP= 108/70 & she denies CP, palpit, dizzy, SOB, worsening edema...    CAD> on  JXB14; followed by Hilary Hertz & seen 1/14- stable & no changes made...    VI> she had laser ablation of left greater saphenous vein 2013 (painful varicosities) by Sheryn Bison; f/u Venous Dopplers 4/14 showed no evid of DVT...    Chol> on Prav40; FLP 8/14 looked good w/ TChol 133, TG 101, HDL 54, LDL 59    Overweight> wt= 230# which is down 2#, but her BMI is 39 & she has a long way to go...    GI- GERD, Polyps, known gallstones> on Omep20; she denies abd pain, dysphagia, n/v, c/d, blood seen...    DJD, s/p bilat TKRs, LBP> on Mobic15, Aleve prn, Vicodin prn; managed by Ortho- DrBeane...    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    Arnold Chiari malformation & hx seizures> she had a suboccipital craniectomy and C1 laminectomy for Arnold-Chiari malformation by DrKritzer in 1993.     Insomnia> prev on Ambien10 prn, now on Desyrel50 => incr to 100mg   qhs...  We reviewed prob list, meds, xrays and labs> see below for updates >>   LABS 8/14:  FLP- at goals on Prav40;  Chems- wnl;  CBC- Hg=11.3, Fe=47 (14%sat);  TSH= 0.96;  VitD= 48...  ~  August 31, 2014:  24mo ROV & [name] is c/o some LBP after moving some furniture- rec to try ice/heat, Tylenol, and let me know if getting worse for Ortho referral;  She has been seeing Podiatry re- hammertoes/ corns/ callouses and may need surg;  We reviewed the following medical problems during today's office visit >>     AR> she sees DrVanWinkle for allergies and "asthma" on shots one per month now...    COPD> ex-smoker, hx pos PPD, AR> on Symbicort160, Proair prn, Flonase prn; she says she quit smoking end of 2013- denies cough, sput, hemoptysis, ch in dyspnea, etc...    HBP> on Lasix40, K20; BP= 112/80 & she denies CP, palpit, dizzy, SOB, worsening edema...    CAD> on ASA81; followed by DrCrenshaw & seen 1/15- stable & no changes made...    VI> she had laser ablation of left greater saphenous vein 2013 (painful varicosities) by Sheryn Bison; f/u Venous Dopplers 4/14 showed no evid of DVT...    Chol> on Prav40; FLP 10/15 looks good w/ TChol 125, TG 112, HDL 53, LDL 50    Overweight> wt= 229# which is down 1#, but her BMI is 39 & she has a long way to go...    GI- GERD, Polyps, known gallstones> on Omep20; she denies abd pain, dysphagia, n/v, c/d, blood seen...    DJD, s/p bilat TKRs, LBP> on Vicodin prn; managed by Ortho- DrBeane & we do not have notes from him...    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    Arnold Chiari malformation & hx seizures> she had a suboccipital craniectomy and C1 laminectomy for Arnold-Chiari malformation by DrKritzer in 1993.     Insomnia> prev on Ambien10 prn, now on Desyrel100mg  qhs...  We reviewed prob list, meds, xrays and labs> see below for updates >>   LABS 10/15:  FLP- at goals on Prav40;  Chems- wnl;  CBC- ok w/ Hg=12.0 MCV=81 Fe=70(20%) & rec to  continue Fe daily;  TSH=0.81...  ~  May 16, 2015:  3mo ROV & [name] presents w/ c/o tingling in her right arm and left shoulder- sl painful w/ ROM & we discussed Rx w/ Mobic and referral to Ortho for further eval..Marland Kitchen  She also c/o incr leg edema- she is on Lasix40 & reminded of the importance of low sodium diet... We reviewed the following medical problems during today's office visit >>     AR> she sees DrVanWinkle for allergies and "asthma" on shots once per month now...    COPD> ex-smoker, hx pos PPD, AR> on Symbicort160, Proair prn, Flonase prn; she says she quit smoking end of 2013- denies cough, sput, hemoptysis, ch in dyspnea, etc...    HBP> on Lasix40, K20; BP= 114/70 & she denies CP, palpit, dizzy, SOB but notes worsening edema as above=> rec incr Lasix to 1-2 tabs Qam.    CAD> on ASA81; followed by DrCrenshaw & seen 1/16- stable & f/u Myoview 1/16 showed no symptoms or EKG changes, no evid of ischemia, norm wall motion, EF=58%, NEG study...    VI> she had laser ablation of left greater saphenous vein 2013 (painful varicosities) by Sheryn Bison; f/u Venous Dopplers 4/14 showed no evid of DVT...    Chol> on Prav40; FLP 10/15 looks good w/ TChol 125, TG 112, HDL 53, LDL 50    Overweight> wt= 231# which is up 2#, w/ BMI= 39 & she has a long way to go...    GI- GERD, Polyps, known gallstones> on Omep20; she denies abd pain, dysphagia, n/v, c/d, blood seen...    DJD, s/p bilat TKRs, LBP> on Vicodin prn; managed by Ortho- DrBeane & we do not have notes from him=> try Mobic & refer back to ortho for arm complaints...    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    Arnold Chiari malformation & hx seizures> she had a suboccipital craniectomy and C1 laminectomy for Arnold-Chiari malformation by DrKritzer in 1993.     Insomnia> prev on Ambien10 prn, now on Desyrel100mg  qhs...  We reviewed prob list, meds, xrays and labs> see below for updates >>  IMP/PLAN>>  We discussed transient incr Lasix  to 1-2 tabs qam depending on edema, reminded  Of low sodium etc;  We will refer to Ortho for arm complaints for eval;  Continue other meds and ROV in 26mo w/ fasting labs...  ~  December 21, 2015:  12mo ROV & [name] reports recent eval by allergist- DrVanwinkle, with incr allergy symptoms- treated w/ shot+meds (cefdinir) "it was misery" now better she says;  She has also had some left shoulder pain, eval by DrBeane (we do not have notes) and MRI pending soon;  Prev c/o legs cramping & improved w/ [facility] OTC cramp pill + Mustard...  See prob list above>    On Symbicort160-2spBid + AlbutHFA rescue & Flonase if needed (ave 1-2 per month)> breathing is good she says...    On ASA81, Lasix40, K20> she has VI & periph edema, reminded to elim sodium Etc... Labs are wnl...    On Prav40 & FLP today shows TChol 156, TG 76, HDL 76, LDL 65    She is obese- on diet + exercise;  She has DJD & needs to incr exercise program as well... EXAM shows Afeb, VSS, O2sat=100% on RA at rest;  W231#, BMI=38-9; HEENT- neg, mallampati3;  Chest- decr BS bilat, clear w/o w/r/r;  Heart- RR gr2/6 SEM w/o r/g;  Abd- obese, soft, neg;  Ext- +VI, 1+edema;  Neuro- intact w/o focal deficits...  LABS 12/21/15>  FLP- all parameters at goals on Prav40;  Chems- wnl;  CBC- ok w/ Hg=12.1, Fe=89;  TSH=1.91... IMP/PLAN>>  Stable on meds, continue same; needs diet 7 exercise;  she had the 2016 Flu vaccine; call for any problems, ROV 62mo...           Problem List:  ALLERGIC RHINITIS (ICD-477.9) > on NASONEX, ASTEPRO, ZYRTEK, & allergy shots- followed by DrVanWinkle... POSITIVE PPD (ICD-795.5) CIGARETTE SMOKER CHRONIC OBSTRUCTIVE ASTHMA UNSPECIFIED (ICD-493.20) - she had +allergy skin testing for dust mites and started on allergy shots per DrESL along w/ SYMBICORT 160- 2spBid, but she doesn't take anything regularly... +smoker but decreased from 1/2 ppd x 54yrs to 1-2 cig/d. ~  2/11: she reports +PPD on pre-employment testing> sent to health dept, CXR-  neg, & started on INH 300mg /d x73mo (they never sent records for [place] to review). ~  CXR 2/11 showed borderline heart size, tort Ao, sl incr markings, NAD... ~  subseq CXRs done at health dept for her pos PPD but we don't have the films or reports... ~  2/13: Refractory AB treated via ER w/ Doxy, Pred, restart Symbicort, +Mucinex, etc; we decided upon Levaquin, Medrol, MMW... ~  CXR 2/13 showed mild cardiomeg, ectatic Ao & coronary calcif, DJD sp, NAD; CTAngio was neg for PE, +bronchial wall thickening, NAD.Marland Kitchen. ~  8/13: she tells me "I saw my asthma doctor last week- doing OK" no changes in meds from DrVanWinkle... ~  2/14: on Symbicort160, Proair prn, Nasonex prn; she says she quit smoking 6mo ago & she is encouraged to remain quit! She denies cough, sput, hemoptysis, ch in dyspnea, etc. ~  8/14: Hx COPD> ex-smoker, hx pos PPD, AR> on Symbicort160, Proair prn, Nasonex prn; she says she quit smoking end of 2013- denies cough, sput, hemoptysis, ch in dyspnea, etc. ~  10/15: on Symbicort160, Proair prn, Flonase prn; she says she quit smoking end of 2013- denies cough, sput, hemoptysis, ch in dyspnea, etc; still gets alergy shots once per month now...  Hx of HYPERTENSION (ICD-401.9) - off Lisinopril/Hct (Hx ACE reaction) & taking LASIX 40mg  as needed for swelling... ~  EKG 6/12 showed SBrady w/ rate= 55, otherw WNL, NAD.Marland Kitchen. ~  5/13:  BP=120/78 today and denies HA, fatigue, visual changes, CP, palipit, dizziness, syncope, dyspnea, edema, etc... ~  8/13:  BP= 124/82 & she remains largely asymtomatic... ~  2/14: on Lasix40, K20; BP= 122/80 & she denies CP, palpit, dizzy, SOB, worsening edema.  ~  8/14: on Lasix40, K20; BP= 108/70 & she remains asymptomatic... ~  10/15: on Lasix40, K20; BP= 112/80 & she denies CP, palpit, dizzy, SOB, worsening edema  CAD (ICD-414.00) - known non-obstructive disease & atypic CP... on ASA 325mg /d, STATIN (Prav40), & off ACE. ~  cath 2/05 showed 20-60% obstructions in all 3  vessels... good LVF... ~  NuclearStressTest 3/09 was negative- no ischemia or infarction, EF=68%... ~  Eval for recurrent CP- EKG showed NSR, NAD;  2DEcho showed mild focal basal septal hypertrophy, norm wall motion w/ EF= 55-65%, mild DD & PAsys= 37;  Myoview NEG- no scar, no ischemia, EF=64%... ~  ROV w/ DrCrenshaw 7/11 reviewed- continue ASA/ Statin/ smoking cessation/ monitor BP & Chol. ~  She saw DrCrenshaw 1/13 for f/u CAD> last Myoview & 2DEcho were in 2010; she's been stable on ASA81mg /d, Lasix, KCl, Prav40; she denies angina & DrCrenshaw rec quit smoking, same meds, lose weight... Note> CTA 2/13 showed atherosclerotic changes seen in Ao & coronaries, mild cardiomegaly ~  She had yearly f/u DrCrenshaw 1/14> felt to be stable, no changes made;  EKG showed SBrady, rate 49, NSSTTWA... ~  on ASA81; followed by DrCrenshaw & seen 1/15- stable &  no changes made...  VENOUS INSUFFICIENCY (ICD-459.81) - she follows a low salt diet... +LASIX 40mg /d for swelling... ~  1/10: we discussed no salt, Lasix20, elevation, support hose. ~  She was evaluated by Sheryn Bison [facility] w/ severe ven insuffic left leg w/ pain & chr swelling, bulging varicosities; using compression hose, no salt, elevation, & Ibuprofen w/o improvement;  He plans laser ablation of the GSV & then he'll consider stab phlebectomy of secondary varicosities later... ~  4/13: s/p left GSV laser ablation; she is feeling better w/ decr pain etc after the procedure... ~  5/13:  F/u VenDopplers by [facility] w/ good ablation, no evid DVT etc... ~  4/14:  She had f/u DrLawson> stable, no DVT, rec compression hose...  HYPERCHOLESTEROLEMIA (ICD-272.0) - on PRAVASTATIN 40mg /d now + diet efforts. ~  FLP 5/08 on Cres5 showed TChol 115, TG 83, HDL 50, LDL 49... tolerating well and taking med regularly. ~  [place] 3/09 on Cres5 showed TChol 126, TG 104, HDL 56, LDL 49... rec- same. ~  FLP 3/10 on Cres5 showed TChol 152, TG 97, HDL 77, LDL 55 ~  FLP 12/10 on Cres5 showed  TChol 127, TG 100, HDL 52, LDL 55 ~  7/11:  pt requests change to less expensive statin> try PRAVASTATIN40 ~  FLP 10/11 on Prav40 showed TChol 143, TG 125, HDL 51, LDL 67... Continue same. ~  FLP 6/12 on Prav40 showed TChol 143, TG 145, HDL 61, LDL 53 ~  FLP 8/13 on Prav40 showed TChol 141, TG 92, HDL 63, LDL 60 ~  FLP 8/14 on Prav40 showed TChol 133, TG 101, HDL 54, LDL 59 ~  FLP 10/15 on Prav40 showed TChol 125, TG 112, HDL 53, LDL 50  OBESITY (ICD-278.00) - unable to diet effectively and get weight down...  ~  weight 5/10 = 238#, 5\' 5"  tall,  BMI= 40... we discussed diet and exercise program... again! ~  weight 1/11 = 228# ~  weight 7/11 = 232# ~  weight 10/11 = 230# ~  Weight 6/12 = 232# ~  Weight 10/12 = 234# ~  Weight 2/13 = 241# ~  Weight 4/13 = 248#... She is asked to get wt down!!! ~  Weight 8/13 = 234#... Great job! ~  [employer] 2/14 = 232# ~  Weight 8/14 = 230# ~  Weight 10/15 = 229#  GERD (ICD-530.81) - EGD 7/05 by DrStark w/ HH- supposed to be on PPI (Omeprazole 20mg ) & H2 blocker (Pepcid 20mg ) at bedtime... ~  2/14: she takes the Omep20 daily but using the Zantac as needed...  COLONIC POLYPS (ICD-211.3) - last colonoscopy was 12/03 showing several 3-28mm polyps (hyperplastic)...  HX OF GALLSTONE (ICD-V12.79) - seen on CT 7/06 and referred to [facility]- eval by DrCornett and being followed...  DEGENERATIVE JOINT DISEASE (ICD-715.90) - followed by DrBeane for ortho... Right knee arthroscopy 1/09 without help... s/p right TKR 06/12/08 by Alpha Gula... 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 she is off the Etodolac & takes PERCOCET Prn... ~  1/11: now complaining of left knee pain & will f/u w/ DrBeane... ~  6/12:  DrBeane plans left TKR soon... OK for surg... ~  7/12: s/p left TKR per DrBeane & went to [facility] for rehab... ~  8/13:  She requests refill MOBIC 15mg  as needed... ~  10/15: on Vicodin prn, we do not have notes from Ortho...  BACK PAIN,  LUMBAR (ICD-724.2) >> presented 4/13 w/ pain in left side of back & radiating down  leg leg... Given Vicodin, anti-inflamm med & muscle relaxer;  Referred to Ortho for further eval==> ?if she ever saw Ortho, pain resolved w/ Mobic, Norco, rest, heat, etc...  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   Past Surgical History  Procedure Laterality Date  . Abdominal hysterectomy    . Cspine surgery  1993    for arnold-chiari malformation  . Right knee arthroscopy  12/2007    Dr. Tonita Cong  . 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 Tonita Cong  . Joint replacement  06/05/11    Left total knee replacement    Outpatient Encounter Prescriptions as of 12/21/2015  Medication Sig  . albuterol (PROVENTIL HFA;VENTOLIN HFA) 108 (90 BASE) MCG/ACT inhaler Inhale 2 puffs into the lungs every 4 (four) hours as needed. For wheeze or shortness of breath  . aspirin 81 MG tablet Take 81 mg by mouth daily.    . ferrous sulfate 325 (65 FE) MG tablet Take 325 mg by mouth daily with breakfast.  . furosemide (LASIX) 40 MG tablet TAKE 1 TABLET (40 MG TOTAL) BY MOUTH DAILY.  Marland Kitchen HYDROcodone-acetaminophen (NORCO/VICODIN) 5-325 MG per tablet Take 1 tablet by mouth TID prn for pain  . Multiple Vitamin (MULTIVITAMIN) capsule Take 1 capsule by mouth daily.    Marland Kitchen omeprazole (PRILOSEC) 20 MG capsule Take 20 mg by mouth daily.  . pravastatin (PRAVACHOL) 40 MG tablet TAKE 1 TABLET BY MOUTH EVERY DAY  . [DISCONTINUED] cefdinir (OMNICEF) 300 MG capsule Take 300 mg by mouth 2 (two) times daily. Reported on 12/31/2015  . [DISCONTINUED] fluticasone (FLONASE) 50 MCG/ACT nasal spray Reported on 06/19/2016  . [DISCONTINUED] KLOR-CON M20 20 MEQ tablet TAKE 1 TABLET (20 MEQ TOTAL) BY MOUTH DAILY.  .  [DISCONTINUED] oxyCODONE-acetaminophen (PERCOCET/ROXICET) 5-325 MG per tablet Reported on 06/19/2016  . [DISCONTINUED] traZODone (DESYREL) 100 MG tablet TAKE 1 TABLET BY MOUTH AT BEDTIME  . [DISCONTINUED] azithromycin (ZITHROMAX) 250 MG tablet Take as directed (Patient not taking: Reported on 12/21/2015)  . [DISCONTINUED] budesonide-formoterol (SYMBICORT) 160-4.5 MCG/ACT inhaler Inhale 2 puffs into the lungs 2 (two) times daily.  . [DISCONTINUED] KLOR-CON M20 20 MEQ tablet TAKE 1 TABLET (20 MEQ TOTAL) BY MOUTH DAILY. (Patient not taking: Reported on 12/21/2015)  . [DISCONTINUED] meloxicam (MOBIC) 15 MG tablet Take 1 tablet (15 mg total) by mouth daily as needed for pain. (Patient not taking: Reported on 12/21/2015)   No facility-administered encounter medications on file as of 12/21/2015.    No Known Allergies   Current Medications, Allergies, Past Medical History, Past Surgical History, Family History, and Social History were reviewed in [employer] record.    Review of Systems         See HPI - all other systems neg except as noted... The patient complains of dyspnea on exertion & gait abn.  The patient denies anorexia, fever, weight loss, weight gain, vision loss, decreased hearing, hoarseness, chest pain, syncope, worsening edema, prolonged cough, headaches, hemoptysis, abdominal pain, melena, hematochezia, severe indigestion/heartburn, hematuria, incontinence, muscle weakness, suspicious skin lesions, transient blindness, depression, unusual weight change, abnormal bleeding, enlarged lymph nodes, and angioedema.     Objective:   Physical Exam      WD, Overweight, 75 y/o BF in NAD... GENERAL:  Alert & oriented; pleasant & cooperative... HEENT:  [place]/AT, EOM-wnl, PERRLA, EACs-clear, TMs-wnl, NOSE-clear, THROAT-clear & wnl, no lesions seen; VOICE- sl hoarse. NECK:  Supple w/ decrROM; no JVD; normal carotid impulses w/o bruits; no thyromegaly or nodules palpated; no  lymphadenopathy. CHEST:  Few scat rhonchi, mild congestion & exp wheezing, no consolidation... HEART:  Regular Rhythm, gr 1/6 SEM without rubs or gallops... ABDOMEN:  Obese, soft & nontender; normal bowel sounds; no organomegaly or masses detected. EXT:  s/p right TKR, mod arthritic changes; +varicose veins (s/p laser ablation left GSV 4/13)/ +venous insuffic/ 1+ edema... NEURO:  CN's intact; no focal neuro deficits... DERM:  No lesions noted; no rash etc...  RADIOLOGY DATA:  Reviewed in the EPIC EMR & discussed w/ the patient...  LABORATORY DATA:  Reviewed in the EPIC EMR & discussed w/ the patient...   Assessment & Plan:    Hx Asthma/ COPD/ AB, smoker (?ex), & hx pos PPD- treated> She's had refractory "attack" & treated in past w/ Levaquin 500mg /d, Medrol dosepak, Mucinex 2Bid, MMW, etc; improved off cigs & on Symbicort...  HBP>  BP controlled on diet + Lasix Rx...  CAD>  Denies CP, angina, etc;  On ASA, statin, off cigs, etc...  Ven Insuffic>  On sodium restriction, elevation, support hose; also on LASIX to 40mg /d & followed by [facility] & s/p laser ablation of left GSV- improved.  CHOL>  Back on Prav40 & FLP is stable...  OBESITY>  Needs better diet & hopefully incr exerc after the knee surg...  GI>  GERD, Polyps, Gallstone> follwed by DrStark & stable on Omep20 but uses prn only...  S/P left TKR 7/12 by DrBeane>  Course complic by bleed into left calf; now stable & improving w/ PT etc...  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 She will need Ortho referral for further eval...  Other medical issues as noted...   Patient's Medications  New Prescriptions   TRAMADOL (ULTRAM) 50 MG TABLET    Take 1 tablet by mouth up to every 8 hours as needed for pain  Previous Medications   ALBUTEROL (PROVENTIL HFA;VENTOLIN HFA) 108 (90 BASE) MCG/ACT INHALER    Inhale  2 puffs into the lungs every 4 (four) hours as needed. For wheeze or shortness of breath   ASPIRIN 81 MG TABLET    Take 81 mg by mouth daily.     FERROUS SULFATE 325 (65 FE) MG TABLET    Take 325 mg by mouth daily with breakfast.   FUROSEMIDE (LASIX) 40 MG TABLET    TAKE 1 TABLET (40 MG TOTAL) BY MOUTH DAILY.   HYDROCODONE-ACETAMINOPHEN (NORCO/VICODIN) 5-325 MG PER TABLET    Take 1 tablet by mouth TID prn for pain   MULTIPLE VITAMIN (MULTIVITAMIN) CAPSULE    Take 1 capsule by mouth daily.     OMEPRAZOLE (PRILOSEC) 20 MG CAPSULE    Take 20 mg by mouth daily.   PRAVASTATIN (PRAVACHOL) 40 MG TABLET    TAKE 1 TABLET BY MOUTH EVERY DAY  Modified Medications   Modified Medication Previous Medication   BUDESONIDE-FORMOTEROL (SYMBICORT) 160-4.5 MCG/ACT INHALER budesonide-formoterol (SYMBICORT) 160-4.5 MCG/ACT inhaler      Inhale 2 puffs into the lungs 2 (two) times daily.    Inhale 2 puffs into the lungs 2 (two) times daily.   POTASSIUM CHLORIDE SA (KLOR-CON M20) 20 MEQ TABLET KLOR-CON M20 20 MEQ tablet      TAKE 1  TABLET (20 MEQ TOTAL) BY MOUTH DAILY.    TAKE 1 TABLET (20 MEQ TOTAL) BY MOUTH DAILY.   TRAZODONE (DESYREL) 100 MG TABLET traZODone (DESYREL) 100 MG tablet      TAKE 1 TABLET BY MOUTH AT BEDTIME    TAKE 1 TABLET BY MOUTH AT BEDTIME  Discontinued Medications   AZITHROMYCIN (ZITHROMAX) 250 MG TABLET    Take as directed   CEFDINIR (OMNICEF) 300 MG CAPSULE    Take 300 mg by mouth 2 (two) times daily. Reported on 12/31/2015   FLUTICASONE (FLONASE) 50 MCG/ACT NASAL SPRAY    Reported on 06/19/2016   KLOR-CON M20 20 MEQ TABLET    TAKE 1 TABLET (20 MEQ TOTAL) BY MOUTH DAILY.   KLOR-CON M20 20 MEQ TABLET    TAKE 1 TABLET (20 MEQ TOTAL) BY MOUTH DAILY.   MELOXICAM (MOBIC) 15 MG TABLET    Take 1 tablet (15 mg total) by mouth daily as needed for pain.   OXYCODONE-ACETAMINOPHEN (PERCOCET/ROXICET) 5-325 MG PER TABLET    Reported on 06/19/2016

## 2016-06-19 NOTE — Patient Instructions (Signed)
Today we updated your med list in our EPIC system...    Continue your current medications the same...  Remember to eliminate salt/ sodium from your diet...  For the leg cramps>    Try a tsp of yellow mustard in the evening...    Try a glass of TONIC WATER as needed...  We wrote a new prescription for TRAMADOL 50mg  - take one tab up to 3 times daily as needed for pain...    Take it w/ an extra strength Tylenol to boost it's effect...  Stay as active as possible...  Call for any questions...  Let's plan a follow up visit in 1mo, sooner if needed for problems.Marland KitchenMarland Kitchen

## 2016-07-08 ENCOUNTER — Encounter: Payer: Self-pay | Admitting: Pulmonary Disease

## 2016-08-12 ENCOUNTER — Encounter: Payer: Self-pay | Admitting: Sports Medicine

## 2016-08-12 ENCOUNTER — Ambulatory Visit (INDEPENDENT_AMBULATORY_CARE_PROVIDER_SITE_OTHER): Payer: BC Managed Care – PPO | Admitting: Sports Medicine

## 2016-08-12 VITALS — BP 152/87 | HR 55 | Ht 65.0 in | Wt 220.0 lb

## 2016-08-12 DIAGNOSIS — M21619 Bunion of unspecified foot: Secondary | ICD-10-CM

## 2016-08-12 DIAGNOSIS — B351 Tinea unguium: Secondary | ICD-10-CM | POA: Diagnosis not present

## 2016-08-12 DIAGNOSIS — Z89422 Acquired absence of other left toe(s): Secondary | ICD-10-CM

## 2016-08-12 DIAGNOSIS — M79673 Pain in unspecified foot: Secondary | ICD-10-CM | POA: Diagnosis not present

## 2016-08-12 DIAGNOSIS — Q828 Other specified congenital malformations of skin: Secondary | ICD-10-CM

## 2016-08-12 NOTE — Progress Notes (Signed)
Patient ID: [name], female   DOB: Aug 14, 1941, 75 y.o.   MRN: [id number] Subjective: [name] is a 75 y.o. female patient who presents to office for evaluation of Left>right foot pain secondary to callus skin and nail that is "loose" at left big toe.  Patient denies any other pedal complaints.   Patient Active Problem List   Diagnosis Date Noted  . Right arm pain 05/16/2015  . Generalized anxiety disorder 08/31/2014  . 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 Edema 08/07/2011  . MENORRHAGIA, POSTMENOPAUSAL 08/13/2010  . CIGARETTE SMOKER 06/11/2010  . POSITIVE PPD 06/10/2010  . CARDIAC MURMUR 11/02/2009  . BRADYCARDIA 11/01/2009  . CHEST PAIN 11/01/2009  . ALLERGIC RHINITIS 04/19/2009  . Chronic obstructive airway disease with asthma ([place]) 04/18/2009  . LEG CRAMPS, NOCTURNAL 11/02/2008  . 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 Essential hypertension 01/17/2008  . COLONIC POLYPS 01/14/2008  . HYPERCHOLESTEROLEMIA 01/14/2008  . Obesity 01/14/2008  . REFLEX SYMPATHETIC DYSTROPHY 01/14/2008  . Coronary atherosclerosis 01/14/2008  . Venous (peripheral) insufficiency 01/14/2008  . GERD 01/14/2008  . Osteoarthritis 01/14/2008  . BACK PAIN, LUMBAR 01/14/2008  . SEIZURES, HX OF 01/14/2008    Current Outpatient Prescriptions on File Prior to Visit  Medication Sig Dispense Refill  . albuterol (PROVENTIL HFA;VENTOLIN HFA) 108 (90 BASE) MCG/ACT inhaler Inhale 2 puffs into the lungs every 4 (four) hours as needed. For wheeze or shortness of breath 1 Inhaler 6  . aspirin 81 MG tablet Take 81 mg by mouth daily.      . budesonide-formoterol (SYMBICORT) 160-4.5 MCG/ACT inhaler Inhale 2 puffs into the lungs 2 (two) times daily. 1 Inhaler 0  . ferrous sulfate 325 (65 FE) MG tablet Take 325 mg by mouth daily with breakfast.    . furosemide (LASIX) 40 MG tablet TAKE 1 TABLET (40 MG TOTAL) BY MOUTH DAILY. 90 tablet 0  . HYDROcodone-acetaminophen  (NORCO/VICODIN) 5-325 MG per tablet Take 1 tablet by mouth TID prn for pain 90 tablet 0  . Multiple Vitamin (MULTIVITAMIN) capsule Take 1 capsule by mouth daily.      Marland Kitchen omeprazole (PRILOSEC) 20 MG capsule Take 20 mg by mouth daily.    . potassium chloride SA (KLOR-CON M20) 20 MEQ tablet TAKE 1 TABLET (20 MEQ TOTAL) BY MOUTH DAILY. 90 tablet 1  . pravastatin (PRAVACHOL) 40 MG tablet TAKE 1 TABLET BY MOUTH EVERY DAY 90 tablet 3  . traMADol (ULTRAM) 50 MG tablet Take 1 tablet by mouth up to every 8 hours as needed for pain 90 tablet 5  . traZODone (DESYREL) 100 MG tablet TAKE 1 TABLET BY MOUTH AT BEDTIME 30 tablet 3   No current facility-administered medications on file prior to visit.     No Known Allergies  Objective:  General: Alert and oriented x3 in no acute distress  Dermatology: Minimal Keratotic lesions present right hallux distal tuft, left hallux and 1st MTPJ with skin lines transversing the lesion, no pain is present with direct pressure to the lesions with a central nucleated core noted, no webspace macerations, no ecchymosis bilateral, nails elongated and dystrophic with subungal debris with mild lifting at left hallux nail distally with no signs of infection.   Vascular: Dorsalis Pedis and Posterior Tibial pedal pulses 1/4, Capillary Fill Time 3 seconds, + scant pedal hair growth bilateral, no edema bilateral lower extremities, Temperature gradient within normal limits.  Neurology: [name] sensation intact via light touch bilateral.  Musculoskeletal: No tenderness with palpation at the  keratotic lesions site on Left>right foot, Muscular strength 5/5 in all groups without pain or limitation on range of motion. Bunion Left>Right. 2nd toe amputation status on left that is causing bunion to be more progressive with keratosis.   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  Landis Martins, DPM

## 2016-08-24 ENCOUNTER — Encounter (HOSPITAL_COMMUNITY): Payer: Self-pay | Admitting: Emergency Medicine

## 2016-08-24 ENCOUNTER — Emergency Department (HOSPITAL_COMMUNITY)
Admission: EM | Admit: 2016-08-24 | Discharge: 2016-08-24 | Disposition: A | Discharge status: Home / Self Care | Payer: BC Managed Care – PPO | Attending: Emergency Medicine | Admitting: Emergency Medicine

## 2016-08-24 ENCOUNTER — Emergency Department (HOSPITAL_COMMUNITY): Payer: BC Managed Care – PPO

## 2016-08-24 DIAGNOSIS — Z7982 Long term (current) use of aspirin: Secondary | ICD-10-CM | POA: Diagnosis not present

## 2016-08-24 DIAGNOSIS — Z96653 Presence of artificial knee joint, bilateral: Secondary | ICD-10-CM | POA: Insufficient documentation

## 2016-08-24 DIAGNOSIS — S199XXA Unspecified injury of neck, initial encounter: Secondary | ICD-10-CM | POA: Diagnosis present

## 2016-08-24 DIAGNOSIS — Z87891 Personal history of nicotine dependence: Secondary | ICD-10-CM | POA: Diagnosis not present

## 2016-08-24 DIAGNOSIS — Y999 Unspecified external cause status: Secondary | ICD-10-CM | POA: Insufficient documentation

## 2016-08-24 DIAGNOSIS — M542 Cervicalgia: Secondary | ICD-10-CM

## 2016-08-24 DIAGNOSIS — I2510 Atherosclerotic heart disease of native coronary artery without angina pectoris: Secondary | ICD-10-CM | POA: Insufficient documentation

## 2016-08-24 DIAGNOSIS — Z79899 Other long term (current) drug therapy: Secondary | ICD-10-CM | POA: Diagnosis not present

## 2016-08-24 DIAGNOSIS — Y939 Activity, unspecified: Secondary | ICD-10-CM | POA: Insufficient documentation

## 2016-08-24 DIAGNOSIS — I10 Essential (primary) hypertension: Secondary | ICD-10-CM | POA: Diagnosis not present

## 2016-08-24 DIAGNOSIS — J449 Chronic obstructive pulmonary disease, unspecified: Secondary | ICD-10-CM | POA: Diagnosis not present

## 2016-08-24 DIAGNOSIS — Y92410 Unspecified street and highway as the place of occurrence of the external cause: Secondary | ICD-10-CM | POA: Diagnosis not present

## 2016-08-24 MED ORDER — ACETAMINOPHEN 500 MG PO TABS: 500.0000 mg | ORAL_TABLET | Freq: Four times a day (QID) | ORAL | 0 refills | Status: DC | PRN

## 2016-08-24 MED ORDER — ACETAMINOPHEN 325 MG PO TABS
650.0000 mg | ORAL_TABLET | Freq: Once | ORAL | Status: AC
  Administered 2016-08-24: 650 mg via ORAL
  Filled 2016-08-24: qty 2

## 2016-08-24 NOTE — ED Notes (Signed)
Patient transported to CT 

## 2016-08-24 NOTE — ED Notes (Signed)
Pt understood dc material. NAD noted. 
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
## 2016-08-26 ENCOUNTER — Telehealth: Payer: Self-pay | Admitting: Pulmonary Disease
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
## 2016-09-25 ENCOUNTER — Telehealth: Payer: Self-pay | Admitting: Pulmonary Disease

## 2016-09-25 MED ORDER — PREDNISONE 5 MG (21) PO TBPK: ORAL_TABLET | ORAL | 0 refills | Status: DC

## 2016-09-25 MED ORDER — AZITHROMYCIN 250 MG PO TABS: ORAL_TABLET | ORAL | 0 refills | Status: DC

## 2016-09-25 NOTE — Telephone Encounter (Signed)
Pt c/o sometimes prod cough with white/clear mucus, wheezing, sob X5 days.  Denies fever, sinus congestion, chills, nausea.  Pt has taken OTC allergy meds which have not helped. Pt requesting abx. Pt uses [facility] on [address].   SN please advise on recs.  Thanks!

## 2016-09-25 NOTE — Telephone Encounter (Signed)
Patient returning call - please call on cell phone -pr

## 2016-09-25 NOTE — Telephone Encounter (Signed)
Called and spoke with pt and she is aware of SN recs. Nothing further is needed.  

## 2016-09-25 NOTE — Telephone Encounter (Signed)
lmtcb x1 for pt. 

## 2016-10-20 ENCOUNTER — Telehealth: Payer: Self-pay | Admitting: Pulmonary Disease

## 2016-10-20 NOTE — Telephone Encounter (Signed)
Per SN---  No salt Elevate legs and wear support hose Increase lasix 40 mg to 2 tabs every morning x 3 days then go back to 40 mg daily.

## 2016-10-20 NOTE — Telephone Encounter (Signed)
Called spoke with patient who reports fluid retention in the bottom of her stomach and both legs x8 days.  Feels 'blowed up' and feels tight and has noticed some increased SOB w/ exertion.  Denies any wheezing, tightness, increased cough/congestion Taking lasix 40mg  daily Propping feet up throughout the day Dr Lenna Gilford please advise, thank you.  Last ov 7.20.17 w/ SN: Patient Instructions  Today we updated your med list in our EPIC system...    Continue your current medications the same...   Remember to eliminate salt/ sodium from your diet...   For the leg cramps>    Try a tsp of yellow mustard in the evening...    Try a glass of TONIC WATER as needed...   We wrote a new prescription for TRAMADOL 50mg  - take one tab up to 3 times daily as needed for pain...    Take it w/ an extra strength Tylenol to boost it's effect...   Stay as active as possible...   Call for any questions...   Let's plan a follow up visit in 29mo, sooner if needed for problems.Marland KitchenMarland Kitchen

## 2016-10-20 NOTE — Telephone Encounter (Signed)
Called spoke with patient, discussed SN's recommendations as stated below.  Pt okay with these recommendations and voiced her understanding.  Pt is aware to call back if symptoms do not improve and she is aware of the office's holiday hours.  Nothing further needed at this time; will sign off.

## 2016-11-11 ENCOUNTER — Ambulatory Visit (INDEPENDENT_AMBULATORY_CARE_PROVIDER_SITE_OTHER): Payer: BC Managed Care – PPO | Admitting: Sports Medicine

## 2016-11-11 ENCOUNTER — Encounter: Payer: Self-pay | Admitting: Sports Medicine

## 2016-11-11 DIAGNOSIS — M79676 Pain in unspecified toe(s): Secondary | ICD-10-CM | POA: Diagnosis not present

## 2016-11-11 DIAGNOSIS — Q828 Other specified congenital malformations of skin: Secondary | ICD-10-CM

## 2016-11-11 DIAGNOSIS — Z89422 Acquired absence of other left toe(s): Secondary | ICD-10-CM

## 2016-11-11 DIAGNOSIS — B351 Tinea unguium: Secondary | ICD-10-CM | POA: Diagnosis not present

## 2016-11-11 DIAGNOSIS — M21619 Bunion of unspecified foot: Secondary | ICD-10-CM

## 2016-11-11 NOTE — Progress Notes (Signed)
Patient ID: [name], female   DOB: 1941-10-16, 75 y.o.   MRN: [id number] Subjective: [name] is a 75 y.o. female patient who retunrs to office for evaluation of Left>right foot pain secondary to callus skin and nail trim.  Patient denies any other pedal complaints.   Patient Active Problem List   Diagnosis Date Noted  . Right arm pain 05/16/2015  . Generalized anxiety disorder 08/31/2014  . 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 Edema 08/07/2011  . MENORRHAGIA, POSTMENOPAUSAL 08/13/2010  . CIGARETTE SMOKER 06/11/2010  . POSITIVE PPD 06/10/2010  . CARDIAC MURMUR 11/02/2009  . BRADYCARDIA 11/01/2009  . CHEST PAIN 11/01/2009  . ALLERGIC RHINITIS 04/19/2009  . Chronic obstructive airway disease with asthma ([place]) 04/18/2009  . LEG CRAMPS, NOCTURNAL 11/02/2008  . 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 Essential hypertension 01/17/2008  . COLONIC POLYPS 01/14/2008  . HYPERCHOLESTEROLEMIA 01/14/2008  . Obesity 01/14/2008  . REFLEX SYMPATHETIC DYSTROPHY 01/14/2008  . Coronary atherosclerosis 01/14/2008  . Venous (peripheral) insufficiency 01/14/2008  . GERD 01/14/2008  . Osteoarthritis 01/14/2008  . BACK PAIN, LUMBAR 01/14/2008  . SEIZURES, HX OF 01/14/2008    Current Outpatient Prescriptions on File Prior to Visit  Medication Sig Dispense Refill  . acetaminophen (TYLENOL) 500 MG tablet Take 1 tablet (500 mg total) by mouth every 6 (six) hours as needed for mild pain or moderate pain. 30 tablet 0  . albuterol (PROVENTIL HFA;VENTOLIN HFA) 108 (90 BASE) MCG/ACT inhaler Inhale 2 puffs into the lungs every 4 (four) hours as needed. For wheeze or shortness of breath 1 Inhaler 6  . aspirin 81 MG tablet Take 81 mg by mouth daily.      Marland Kitchen azithromycin (ZITHROMAX) 250 MG tablet Take as directed 6 tablet 0  . budesonide-formoterol (SYMBICORT) 160-4.5 MCG/ACT inhaler Inhale 2 puffs into the lungs 2 (two) times daily. 1 Inhaler 0  . ferrous sulfate  325 (65 FE) MG tablet Take 325 mg by mouth daily with breakfast.    . furosemide (LASIX) 40 MG tablet TAKE 1 TABLET (40 MG TOTAL) BY MOUTH DAILY. 90 tablet 0  . HYDROcodone-acetaminophen (NORCO/VICODIN) 5-325 MG per tablet Take 1 tablet by mouth TID prn for pain 90 tablet 0  . Multiple Vitamin (MULTIVITAMIN) capsule Take 1 capsule by mouth daily.      Marland Kitchen omeprazole (PRILOSEC) 20 MG capsule Take 20 mg by mouth daily.    . potassium chloride SA (KLOR-CON M20) 20 MEQ tablet TAKE 1 TABLET (20 MEQ TOTAL) BY MOUTH DAILY. 90 tablet 1  . pravastatin (PRAVACHOL) 40 MG tablet TAKE 1 TABLET BY MOUTH EVERY DAY 90 tablet 3  . predniSONE (STERAPRED UNI-PAK 21 TAB) 5 MG (21) TBPK tablet Take as directed 21 tablet 0  . traMADol (ULTRAM) 50 MG tablet Take 1 tablet by mouth up to every 8 hours as needed for pain 90 tablet 5  . traZODone (DESYREL) 100 MG tablet TAKE 1 TABLET BY MOUTH AT BEDTIME 30 tablet 3   No current facility-administered medications on file prior to visit.     No Known Allergies  Objective:  General: Alert and oriented x3 in no acute distress  Dermatology: Minimal Keratotic lesions present right hallux distal tuft, left hallux and 1st MTPJ with skin lines transversing the lesion, no pain is present with direct pressure to the lesions with a central nucleated core noted, no webspace macerations, no ecchymosis bilateral, nails elongated and dystrophic with subungal debris with mild lifting at left hallux nail  distally with no signs of infection.   Vascular: Dorsalis Pedis and Posterior Tibial pedal pulses 1/4, Capillary Fill Time 3 seconds, + scant pedal hair growth bilateral, no edema bilateral lower extremities, Temperature gradient within normal limits.  Neurology: Johney Maine sensation intact via light touch bilateral.  Musculoskeletal: No tenderness with palpation at the keratotic lesions site on Left>right foot, Muscular strength 5/5 in all groups without pain or limitation on range of motion.  Bunion Left>Right. 2nd toe amputation status on left that is causing bunion to be more progressive with keratosis.   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 Patient does not want nail procedure at this time; will continue with nail trims -Callus are minimal today, no trim needed  -Encouraged daily skin emollients -Advised good supportive shoes and inserts for foot type -Gave to protector and spacers bilateral to assist to offload callus areas  -Patient to return to office as needed/3 months or sooner if condition worsens.  Landis Martins, DPM

## 2016-11-13 ENCOUNTER — Other Ambulatory Visit: Payer: Self-pay | Admitting: Pulmonary Disease

## 2016-12-02 ENCOUNTER — Other Ambulatory Visit: Payer: Self-pay | Admitting: Pulmonary Disease

## 2016-12-19 ENCOUNTER — Other Ambulatory Visit: Payer: Self-pay | Admitting: Pulmonary Disease

## 2017-01-06 ENCOUNTER — Telehealth: Payer: Self-pay | Admitting: Pulmonary Disease

## 2017-01-06 NOTE — Telephone Encounter (Signed)
Pt scheduled for acute visit with SN on 01-07-17 @ 11:30. Nothing further needed.

## 2017-01-06 NOTE — Telephone Encounter (Signed)
lmtcb x1 for pt. 

## 2017-01-06 NOTE — Telephone Encounter (Signed)
Pt c/o worsening prod cough with white mucus, sinus congestion, sob X3 days.  Pt denies fever, chest pain, chills.  Pt has been taking otc cough syrup to help with symptoms.  Requesting further recs.    Pt uses [facility] on [address]   SN please advise on recs.  Thanks!

## 2017-01-06 NOTE — Telephone Encounter (Signed)
Pt returning call.[name] ° °

## 2017-01-06 NOTE — Telephone Encounter (Signed)
Per SN--pt over due for 27mo rov. Pt will need to be scheduled.  I have left message to make pt aware.  Pt can be scheduled in held slot tomorrow at 11:30.

## 2017-01-07 ENCOUNTER — Encounter: Payer: Self-pay | Admitting: Pulmonary Disease

## 2017-01-07 ENCOUNTER — Ambulatory Visit
Admission: RE | Admit: 2017-01-07 | Discharge: 2017-01-07 | Disposition: A | Discharge status: Home / Self Care | Payer: BC Managed Care – PPO | Source: Ambulatory Visit | Attending: Pulmonary Disease | Admitting: Pulmonary Disease

## 2017-01-07 ENCOUNTER — Telehealth: Payer: Self-pay | Admitting: Pulmonary Disease

## 2017-01-07 ENCOUNTER — Other Ambulatory Visit (INDEPENDENT_AMBULATORY_CARE_PROVIDER_SITE_OTHER): Payer: BC Managed Care – PPO

## 2017-01-07 ENCOUNTER — Ambulatory Visit (INDEPENDENT_AMBULATORY_CARE_PROVIDER_SITE_OTHER): Payer: BC Managed Care – PPO | Admitting: Pulmonary Disease

## 2017-01-07 VITALS — BP 132/80 | HR 55 | Temp 98.0°F | Ht 65.0 in | Wt 232.0 lb

## 2017-01-07 DIAGNOSIS — J441 Chronic obstructive pulmonary disease with (acute) exacerbation: Secondary | ICD-10-CM | POA: Insufficient documentation

## 2017-01-07 DIAGNOSIS — K219 Gastro-esophageal reflux disease without esophagitis: Secondary | ICD-10-CM | POA: Diagnosis not present

## 2017-01-07 DIAGNOSIS — F411 Generalized anxiety disorder: Secondary | ICD-10-CM

## 2017-01-07 DIAGNOSIS — I2510 Atherosclerotic heart disease of native coronary artery without angina pectoris: Secondary | ICD-10-CM

## 2017-01-07 DIAGNOSIS — M159 Polyosteoarthritis, unspecified: Secondary | ICD-10-CM

## 2017-01-07 DIAGNOSIS — R609 Edema, unspecified: Secondary | ICD-10-CM

## 2017-01-07 DIAGNOSIS — J449 Chronic obstructive pulmonary disease, unspecified: Secondary | ICD-10-CM | POA: Diagnosis not present

## 2017-01-07 DIAGNOSIS — I10 Essential (primary) hypertension: Secondary | ICD-10-CM

## 2017-01-07 DIAGNOSIS — M8949 Other hypertrophic osteoarthropathy, multiple sites: Secondary | ICD-10-CM

## 2017-01-07 DIAGNOSIS — M150 Primary generalized (osteo)arthritis: Secondary | ICD-10-CM

## 2017-01-07 DIAGNOSIS — E7800 Pure hypercholesterolemia, unspecified: Secondary | ICD-10-CM

## 2017-01-07 DIAGNOSIS — M545 Low back pain, unspecified: Secondary | ICD-10-CM

## 2017-01-07 DIAGNOSIS — E6609 Other obesity due to excess calories: Secondary | ICD-10-CM

## 2017-01-07 DIAGNOSIS — IMO0001 Reserved for inherently not codable concepts without codable children: Secondary | ICD-10-CM

## 2017-01-07 DIAGNOSIS — Z6837 Body mass index (BMI) 37.0-37.9, adult: Secondary | ICD-10-CM

## 2017-01-07 DIAGNOSIS — Z96653 Presence of artificial knee joint, bilateral: Secondary | ICD-10-CM | POA: Insufficient documentation

## 2017-01-07 DIAGNOSIS — G8929 Other chronic pain: Secondary | ICD-10-CM

## 2017-01-07 DIAGNOSIS — I872 Venous insufficiency (chronic) (peripheral): Secondary | ICD-10-CM

## 2017-01-07 LAB — COMPREHENSIVE METABOLIC PANEL
ALT: 14 U/L (ref 0–35)
AST: 18 U/L (ref 0–37)
Albumin: 4.1 g/dL (ref 3.5–5.2)
Alkaline Phosphatase: 84 U/L (ref 39–117)
BUN: 7 mg/dL (ref 6–23)
CO2: 28 mEq/L (ref 19–32)
Calcium: 9.5 mg/dL (ref 8.4–10.5)
Chloride: 106 mEq/L (ref 96–112)
Creatinine, Ser: 0.66 mg/dL (ref 0.40–1.20)
GFR: 112.6 mL/min (ref >60.00)
Glucose, Bld: 90 mg/dL (ref 70–99)
Potassium: 4.2 mEq/L (ref 3.5–5.1)
Sodium: 140 mEq/L (ref 135–145)
Total Bilirubin: 0.5 mg/dL (ref 0.2–1.2)
Total Protein: 7.3 g/dL (ref 6.0–8.3)

## 2017-01-07 LAB — CBC WITH DIFFERENTIAL/PLATELET
Basophils Absolute: 0.1 10*3/uL (ref 0.0–0.1)
Basophils Relative: 1.1 % (ref 0.0–3.0)
Eosinophils Absolute: 0.1 10*3/uL (ref 0.0–0.7)
Eosinophils Relative: 0.9 % (ref 0.0–5.0)
HCT: 37.5 % (ref 36.0–46.0)
Hemoglobin: 12.3 g/dL (ref 12.0–15.0)
Lymphocytes Relative: 39.6 % (ref 12.0–46.0)
Lymphs Abs: 2.1 10*3/uL (ref 0.7–4.0)
MCHC: 32.7 g/dL (ref 30.0–36.0)
MCV: 83.8 fl (ref 78.0–100.0)
Monocytes Absolute: 0.4 10*3/uL (ref 0.1–1.0)
Monocytes Relative: 7.8 % (ref 3.0–12.0)
Neutro Abs: 2.7 10*3/uL (ref 1.4–7.7)
Neutrophils Relative %: 50.6 % (ref 43.0–77.0)
Platelets: 187 10*3/uL (ref 150.0–400.0)
RBC: 4.48 Mil/uL (ref 3.87–5.11)
RDW: 13.7 % (ref 11.5–15.5)
WBC: 5.4 10*3/uL (ref 4.0–10.5)

## 2017-01-07 LAB — TSH: TSH: 1.82 u[IU]/mL (ref 0.35–4.50)

## 2017-01-07 MED ORDER — METHYLPREDNISOLONE ACETATE 80 MG/ML IJ SUSP
80.0000 mg | Freq: Once | INTRAMUSCULAR | Status: AC
  Administered 2017-01-07: 80 mg via INTRAMUSCULAR

## 2017-01-07 MED ORDER — HYDROCODONE-ACETAMINOPHEN 5-325 MG PO TABS: ORAL_TABLET | ORAL | 0 refills | Status: DC

## 2017-01-07 MED ORDER — AZITHROMYCIN 250 MG PO TABS: ORAL_TABLET | ORAL | 0 refills | Status: DC

## 2017-01-07 MED ORDER — PREDNISONE 10 MG PO TABS: ORAL_TABLET | ORAL | 0 refills | Status: DC

## 2017-01-07 NOTE — Telephone Encounter (Signed)
[email or web address]
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
## 2017-01-07 NOTE — Patient Instructions (Signed)
Today we updated your med list in our EPIC system...    Continue your current medications the same...  We refilled the ZPak antibiotic- take as directed on the pack...  For the asthmatic bronchitis we gave you a DEPO shot today... Starting in the AM 2/8> take the PREDNISONE 10mg  tabs- one tab twice daily for 4 days...    Then decrease to 1 tab each AM for 4 days...    Then decrease to 1/2 tab each AM for 4 days...    Then decrease to 1/2 tab every other day til gone (1/2, 0, 1/2, 0, etc)  Use the OTC MUCINEX 600mg  tabs taking 2 tabs twice daily w/ lots of fluids...  We refilled your Hydrocodone pain pill- you may use this up to three times a day as needed for severe pain...  Call for any questions...  Let's plan a follow up visit in 3-66mo, sooner if needed for problems.Marland KitchenMarland Kitchen

## 2017-01-08 ENCOUNTER — Ambulatory Visit (INDEPENDENT_AMBULATORY_CARE_PROVIDER_SITE_OTHER)
Admission: RE | Admit: 2017-01-08 | Discharge: 2017-01-08 | Disposition: A | Discharge status: Home / Self Care | Payer: BC Managed Care – PPO | Source: Ambulatory Visit | Attending: Pulmonary Disease | Admitting: Pulmonary Disease

## 2017-01-08 DIAGNOSIS — J441 Chronic obstructive pulmonary disease with (acute) exacerbation: Secondary | ICD-10-CM

## 2017-01-08 NOTE — Telephone Encounter (Signed)
Noted  

## 2017-01-12 ENCOUNTER — Telehealth: Payer: Self-pay

## 2017-01-12 ENCOUNTER — Telehealth: Payer: Self-pay | Admitting: Pulmonary Disease

## 2017-01-12 NOTE — Telephone Encounter (Signed)
[phone] pt calling back

## 2017-01-12 NOTE — Telephone Encounter (Signed)
Pt can be reached @ [phone].[name]

## 2017-01-12 NOTE — Progress Notes (Signed)
Left message for patient to call back  

## 2017-01-12 NOTE — Telephone Encounter (Signed)
Patient returning call - She can be reached at [phone]

## 2017-01-12 NOTE — Telephone Encounter (Signed)
Spoke with pt. And made her aware of her results per SN. Pt. Had no further questions nothing further is needed at this time/

## 2017-01-12 NOTE — Telephone Encounter (Signed)
Please notify patient>   CXR shows some cardiac enlargement, atherosclerosis of Ao, clear lungs- NAD...       Please notify patient> LABS look good... Continue same meds.  Chems- wnl; CBC- ok w/ Hg=12.3; Thyroid- wnl...   lmomtcb x 2 for the pt to discuss her lab and cxr results.

## 2017-01-12 NOTE — Telephone Encounter (Signed)
SN  Please Advise-  Pt. Called in wanting to know if we could call something in for her cough, says it has not been better since her last ov with you. Denies fever, or any new breathing problems. States she has tried otc cough medication like dayquil  But has no relief.   No Known Allergies   Last OV 01/07/17

## 2017-01-12 NOTE — Telephone Encounter (Signed)
Per SN: can call in Tramadol 50mg  #50 1po TID prn OR she can pick up Rx for Hycodan #6oz 1tsp po every 6 hours as needed.  LMOM TCB x1.

## 2017-01-13 NOTE — Telephone Encounter (Signed)
lmtcb x2 for pt. 

## 2017-01-14 ENCOUNTER — Telehealth: Payer: Self-pay | Admitting: Pulmonary Disease

## 2017-01-14 MED ORDER — HYDROCODONE-HOMATROPINE 5-1.5 MG/5ML PO SYRP: 5.0000 mL | ORAL_SOLUTION | Freq: Four times a day (QID) | ORAL | 0 refills | Status: DC | PRN

## 2017-01-14 NOTE — Telephone Encounter (Signed)
Spoke with pt. She is aware of SN's response. Pt doesn't want either prescription at this time. She will call back and let us know if she wants either one. Nothing further was needed.

## 2017-01-14 NOTE — Telephone Encounter (Addendum)
See telephone encounter >> 01/12/17.  Pt now wants to pick up the prescription for Hycodan. Rx has been signed by SN and placed up front for pick up. Nothing further was needed.

## 2017-01-29 ENCOUNTER — Telehealth: Payer: Self-pay | Admitting: Pulmonary Disease

## 2017-01-29 MED ORDER — METHYLPREDNISOLONE 8 MG PO TABS: ORAL_TABLET | ORAL | 0 refills | Status: DC

## 2017-01-29 NOTE — Telephone Encounter (Signed)
Patient is returning phone call.  °

## 2017-01-29 NOTE — Telephone Encounter (Signed)
Spoke with pt. States that she is not feeling any better. Reports coughing, runny nose and wheezing. Cough is producing clear mucus. Denies chest tightness, SOB or fever. Was given an antibiotic and prednisone few weeks ago for the same issues. Symptoms only cleared up for 2 days then they returned. Pt would like SN's recommendations.  No Known Allergies   SN - please advise. Thanks.

## 2017-01-29 NOTE — Telephone Encounter (Signed)
lmtcb x1 for pt. 

## 2017-01-29 NOTE — Telephone Encounter (Signed)
Per SN >> Medrol 8mg  #20 - Take 1 tablet BID x3 days, then 1 tablet daily x3 days, 1/2 tablet daily x3 days, 1/2 tablet every other day until gone. Mucinex OTC 600mg  1 po BID and Benadryl 25mg  at bedtime for sleep.  Spoke with pt. She is aware of SN's recommendations. Rx has been sent in. Nothing further was needed.

## 2017-02-10 ENCOUNTER — Ambulatory Visit: Payer: BC Managed Care – PPO | Admitting: Sports Medicine

## 2017-02-23 ENCOUNTER — Telehealth: Payer: Self-pay | Admitting: Pulmonary Disease

## 2017-02-23 NOTE — Telephone Encounter (Signed)
Spoke with pt, aware of recs.  Scheduled with SN's next available (tp has no openings this week), and advised pt to go to UC if symptoms worsen between now and Wednesday.  Nothing further needed at this time.

## 2017-02-23 NOTE — Telephone Encounter (Signed)
Pt c/o possible bee sting on R thigh approx 6 inches above knee- states spot is approx thumb nail sized, very red, not hot to touch, stinging and sore, present X3 days.  Size has not increased or decreased per pt. States that she can see a brown center when she puts cream on spot but cannot see spot when it is dry.  Spot is not weeping.   Denies fever, throat/tongue swelling, increased sob.  States pain is moderate and localized to site.   Pt has been using neosporin and otc insect bite cream.   Requesting further recs.    [facility] uses [facility] on [address].  SN please advise on further recs.  Thanks!

## 2017-02-23 NOTE — Telephone Encounter (Signed)
Per SN---  If this is not going away, then it will need to be checked.  SN, TP or urgent care.  thanks

## 2017-02-25 ENCOUNTER — Ambulatory Visit: Payer: BC Managed Care – PPO | Admitting: Pulmonary Disease

## 2017-02-26 ENCOUNTER — Encounter: Payer: Self-pay | Admitting: Pulmonary Disease

## 2017-02-26 ENCOUNTER — Ambulatory Visit (INDEPENDENT_AMBULATORY_CARE_PROVIDER_SITE_OTHER): Payer: BC Managed Care – PPO | Admitting: Pulmonary Disease

## 2017-02-26 VITALS — BP 110/70 | HR 70 | Temp 96.8°F | Ht 65.0 in | Wt 229.1 lb

## 2017-02-26 DIAGNOSIS — I10 Essential (primary) hypertension: Secondary | ICD-10-CM | POA: Diagnosis not present

## 2017-02-26 DIAGNOSIS — J449 Chronic obstructive pulmonary disease, unspecified: Secondary | ICD-10-CM | POA: Diagnosis not present

## 2017-02-26 DIAGNOSIS — M150 Primary generalized (osteo)arthritis: Secondary | ICD-10-CM

## 2017-02-26 DIAGNOSIS — Z96653 Presence of artificial knee joint, bilateral: Secondary | ICD-10-CM

## 2017-02-26 DIAGNOSIS — F411 Generalized anxiety disorder: Secondary | ICD-10-CM | POA: Diagnosis not present

## 2017-02-26 DIAGNOSIS — M8949 Other hypertrophic osteoarthropathy, multiple sites: Secondary | ICD-10-CM

## 2017-02-26 DIAGNOSIS — B029 Zoster without complications: Secondary | ICD-10-CM

## 2017-02-26 DIAGNOSIS — M159 Polyosteoarthritis, unspecified: Secondary | ICD-10-CM

## 2017-02-26 MED ORDER — FAMCICLOVIR 500 MG PO TABS: 500.0000 mg | ORAL_TABLET | Freq: Three times a day (TID) | ORAL | 0 refills | Status: DC

## 2017-02-26 MED ORDER — METHYLPREDNISOLONE 4 MG PO TBPK: ORAL_TABLET | ORAL | 0 refills | Status: DC

## 2017-02-26 NOTE — Patient Instructions (Addendum)
Today we updated your med list in our EPIC system...    Continue your current medications the same...  The rash on your right leg is SHINGLES...  We have prescribed FAMVIR (generic) 500mg  one tab three times daily til gone...    And a MEDROL DOSEPAK - take as directed on the pack  OK to use the your topical cream if necessary...  Call for any questions...  Let's plan to keep your prev scheduled f/u appt in May.Marland KitchenMarland Kitchen

## 2017-03-01 ENCOUNTER — Encounter: Payer: Self-pay | Admitting: Pulmonary Disease
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
## 2017-03-03 ENCOUNTER — Encounter: Payer: Self-pay | Admitting: Sports Medicine

## 2017-03-03 ENCOUNTER — Ambulatory Visit (INDEPENDENT_AMBULATORY_CARE_PROVIDER_SITE_OTHER): Payer: BC Managed Care – PPO | Admitting: Sports Medicine

## 2017-03-03 DIAGNOSIS — B351 Tinea unguium: Secondary | ICD-10-CM

## 2017-03-03 DIAGNOSIS — M79671 Pain in right foot: Secondary | ICD-10-CM

## 2017-03-03 DIAGNOSIS — Q828 Other specified congenital malformations of skin: Secondary | ICD-10-CM

## 2017-03-03 DIAGNOSIS — M79672 Pain in left foot: Secondary | ICD-10-CM

## 2017-03-03 DIAGNOSIS — Z89422 Acquired absence of other left toe(s): Secondary | ICD-10-CM

## 2017-03-03 NOTE — Progress Notes (Signed)
Patient ID: DANESE DORSAINVIL, female   DOB: 1941-05-13, 76 y.o.   MRN: [id number] Subjective: [name] is a 76 y.o. female patient who retunrs to office for evaluation of Left>right foot pain secondary to callus skin and nail trim.  Patient denies any other pedal complaints.   Patient Active Problem List   Diagnosis Date Noted  . Shingles 02/26/2017  . COPD exacerbation ([place]) 01/07/2017  . S/P TKR (total knee replacement), bilateral 01/07/2017  . Right arm pain 05/16/2015  . Generalized anxiety disorder 08/31/2014  . Varicose veins of lower extremities with other complications 62/37/6283  . Edema 08/07/2011  . MENORRHAGIA, POSTMENOPAUSAL 08/13/2010  . CIGARETTE SMOKER 06/11/2010  . POSITIVE PPD 06/10/2010  . CARDIAC MURMUR 11/02/2009  . BRADYCARDIA 11/01/2009  . CHEST PAIN 11/01/2009  . ALLERGIC RHINITIS 04/19/2009  . Chronic obstructive airway disease with asthma ([place]) 04/18/2009  . LEG CRAMPS, NOCTURNAL 11/02/2008  . 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 Essential hypertension 01/17/2008  . COLONIC POLYPS 01/14/2008  . HYPERCHOLESTEROLEMIA 01/14/2008  . Obesity 01/14/2008  . REFLEX SYMPATHETIC DYSTROPHY 01/14/2008  . Coronary atherosclerosis 01/14/2008  . Venous (peripheral) insufficiency 01/14/2008  . GERD 01/14/2008  . Osteoarthritis 01/14/2008  . BACK PAIN, LUMBAR 01/14/2008  . SEIZURES, HX OF 01/14/2008    Current Outpatient Prescriptions on File Prior to Visit  Medication Sig Dispense Refill  . acetaminophen (TYLENOL) 500 MG tablet Take 1 tablet (500 mg total) by mouth every 6 (six) hours as needed for mild pain or moderate pain. 30 tablet 0  . albuterol (PROVENTIL HFA;VENTOLIN HFA) 108 (90 BASE) MCG/ACT inhaler Inhale 2 puffs into the lungs every 4 (four) hours as needed. For wheeze or shortness of breath 1 Inhaler 6  . aspirin 81 MG tablet Take 81 mg by mouth daily.      . budesonide-formoterol (SYMBICORT) 160-4.5 MCG/ACT inhaler Inhale 2 puffs into the lungs 2  (two) times daily. 1 Inhaler 0  . famciclovir (FAMVIR) 500 MG tablet Take 1 tablet (500 mg total) by mouth 3 (three) times daily. 21 tablet 0  . ferrous sulfate 325 (65 FE) MG tablet Take 325 mg by mouth daily with breakfast.    . furosemide (LASIX) 40 MG tablet TAKE 1 TABLET (40 MG TOTAL) BY MOUTH DAILY. 90 tablet 0  . HYDROcodone-acetaminophen (NORCO/VICODIN) 5-325 MG tablet Take 1 tablet by mouth TID prn for pain 90 tablet 0  . HYDROcodone-homatropine (HYCODAN) 5-1.5 MG/5ML syrup Take 5 mLs by mouth every 6 (six) hours as needed for cough. 180 mL 0  . KLOR-CON M20 20 MEQ tablet TAKE 1 TABLET BY MOUTH DAILY 90 tablet 1  . methylPREDNISolone (MEDROL DOSEPAK) 4 MG TBPK tablet Take as directed 21 tablet 0  . Multiple Vitamin (MULTIVITAMIN) capsule Take 1 capsule by mouth daily.      Marland Kitchen omeprazole (PRILOSEC) 20 MG capsule Take 20 mg by mouth daily.    . pravastatin (PRAVACHOL) 40 MG tablet TAKE 1 TABLET BY MOUTH EVERY DAY 90 tablet 3  . predniSONE (DELTASONE) 10 MG tablet Take as directed 20 tablet 0  . traZODone (DESYREL) 100 MG tablet TAKE 1 TABLET BY MOUTH AT BEDTIME 30 tablet 3   No current facility-administered medications on file prior to visit.     No Known Allergies  Objective:  General: Alert and oriented x3 in no acute distress  Dermatology: Minimal Keratotic lesions present right hallux distal tuft, left hallux and 1st MTPJ with skin lines transversing the lesion, no pain is present with direct  pressure to the lesions with a central nucleated core noted, no webspace macerations, no ecchymosis bilateral, nails elongated and dystrophic with subungal debris with mild lifting at left hallux nail distally with no signs of infection.   Vascular: Dorsalis Pedis and Posterior Tibial pedal pulses 1/4, Capillary Fill Time 3 seconds, + scant pedal hair growth bilateral, no edema bilateral lower extremities, Temperature gradient within normal limits.  Neurology: Johney Maine sensation intact via light  touch bilateral.  Musculoskeletal: No tenderness with palpation at the keratotic lesions site on Left>right foot, Muscular strength 5/5 in all groups without pain or limitation on range of motion. Bunion Left>Right. 2nd toe amputation status on left that is causing bunion to be more progressive with keratosis.   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  -Callus are minimal today, no trim needed  -Encouraged daily skin emollients -Advised good supportive shoes and inserts for foot type -Continue with toe protector and spacers bilateral to assist to offload callus areas  -Patient to return to office as needed/3 months or sooner if condition worsens.  Landis Martins, DPM

## 2017-04-06 ENCOUNTER — Telehealth: Payer: Self-pay | Admitting: Pulmonary Disease

## 2017-04-06 ENCOUNTER — Other Ambulatory Visit: Payer: Self-pay | Admitting: Pulmonary Disease

## 2017-04-06 NOTE — Telephone Encounter (Signed)
lmtcb X1 for pt  

## 2017-04-06 NOTE — Telephone Encounter (Signed)
Pt returning call.[name] ° °

## 2017-04-06 NOTE — Telephone Encounter (Signed)
Spoke with pt, states her insurance told her that [facility] could no longer be her PCP, according to her insurance card she was assigned to [facility] with [employer].  I advised pt that SN would be happy to see pt for pulmonary needs but if her insurance will no longer cover her visits for PCP care with [facility], she will need to establish with the PCP office provided to her.  Pt expressed understanding.  Nothing further needed.

## 2017-04-08 ENCOUNTER — Ambulatory Visit: Payer: BC Managed Care – PPO | Admitting: Pulmonary Disease

## 2017-04-14 ENCOUNTER — Ambulatory Visit (INDEPENDENT_AMBULATORY_CARE_PROVIDER_SITE_OTHER): Payer: BC Managed Care – PPO | Admitting: Pulmonary Disease

## 2017-04-14 ENCOUNTER — Encounter: Payer: Self-pay | Admitting: Pulmonary Disease

## 2017-04-14 VITALS — BP 124/76 | HR 55 | Temp 98.6°F | Ht 65.0 in | Wt 229.2 lb

## 2017-04-14 DIAGNOSIS — F411 Generalized anxiety disorder: Secondary | ICD-10-CM

## 2017-04-14 DIAGNOSIS — M8949 Other hypertrophic osteoarthropathy, multiple sites: Secondary | ICD-10-CM

## 2017-04-14 DIAGNOSIS — I10 Essential (primary) hypertension: Secondary | ICD-10-CM | POA: Diagnosis not present

## 2017-04-14 DIAGNOSIS — M150 Primary generalized (osteo)arthritis: Secondary | ICD-10-CM

## 2017-04-14 DIAGNOSIS — Z96653 Presence of artificial knee joint, bilateral: Secondary | ICD-10-CM

## 2017-04-14 DIAGNOSIS — M159 Polyosteoarthritis, unspecified: Secondary | ICD-10-CM

## 2017-04-14 DIAGNOSIS — J449 Chronic obstructive pulmonary disease, unspecified: Secondary | ICD-10-CM

## 2017-04-14 MED ORDER — HYDROCODONE-ACETAMINOPHEN 5-325 MG PO TABS
ORAL_TABLET | ORAL | 0 refills | Status: DC
Start: 2017-04-14 — End: 2017-07-06

## 2017-04-14 NOTE — Patient Instructions (Signed)
Today we updated your med list in our EPIC system...    Continue your current medications the same...  We refilled your Vicodin as needed for pain...  Keep up the good effort at [place]...    We'd love to see the weight coming down...  Call for any questions or if we can be of service in any way.Marland KitchenMarland Kitchen

## 2017-04-14 NOTE — Progress Notes (Signed)
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 prn only...  S/P left TKR 7/12 by DrBeane>  Course complic by bleed into left calf; now stable & improving w/ PT etc...  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 She will need Ortho referral for further eval...  Other medical issues as noted...   Patient's Medications  New Prescriptions   No medications on file  Previous Medications   ACETAMINOPHEN (TYLENOL) 500 MG TABLET    Take 1 tablet (500 mg total) by mouth every 6 (six) hours as needed for mild pain or moderate pain.   ALBUTEROL (PROVENTIL HFA;VENTOLIN HFA) 108 (90 BASE) MCG/ACT INHALER    Inhale 2 puffs into the lungs every 4 (four) hours as needed. For wheeze or shortness of breath   ASPIRIN 81 MG TABLET    Take 81 mg by mouth daily.     BUDESONIDE-FORMOTEROL (SYMBICORT) 160-4.5 MCG/ACT INHALER    Inhale 2 puffs into the lungs 2 (two) times daily.   FAMCICLOVIR (FAMVIR) 500 MG TABLET    Take 1 tablet (500 mg total) by mouth 3 (three) times daily.   FERROUS SULFATE 325 (65 FE) MG TABLET    Take 325 mg by mouth daily with breakfast.   FUROSEMIDE (LASIX) 40 MG TABLET    TAKE 1 TABLET (40 MG TOTAL) BY MOUTH DAILY.   KLOR-CON M20 20 MEQ TABLET    TAKE 1  TABLET BY MOUTH DAILY   MULTIPLE VITAMIN (MULTIVITAMIN) CAPSULE    Take 1 capsule by mouth daily.     OMEPRAZOLE (PRILOSEC) 20 MG CAPSULE    Take 20 mg by mouth daily.   PRAVASTATIN (PRAVACHOL) 40 MG TABLET    TAKE 1 TABLET BY MOUTH EVERY DAY   TRAZODONE (DESYREL) 100 MG TABLET    TAKE 1 TABLET BY MOUTH AT BEDTIME  Modified Medications   Modified Medication Previous Medication   HYDROCODONE-ACETAMINOPHEN (NORCO/VICODIN) 5-325 MG TABLET HYDROcodone-acetaminophen (NORCO/VICODIN) 5-325 MG tablet      Take 1 tablet by mouth TID prn for  pain    Take 1 tablet by mouth TID prn for pain  Discontinued Medications   HYDROCODONE-HOMATROPINE (HYCODAN) 5-1.5 MG/5ML SYRUP    Take 5 mLs by mouth every 6 (six) hours as needed for cough.   METHYLPREDNISOLONE (MEDROL DOSEPAK) 4 MG TBPK TABLET    Take as directed   PREDNISONE (DELTASONE) 10 MG TABLET    Take as directed

## 2017-04-20 ENCOUNTER — Encounter (HOSPITAL_COMMUNITY): Payer: Self-pay | Admitting: Emergency Medicine

## 2017-04-20 ENCOUNTER — Emergency Department (HOSPITAL_COMMUNITY)
Admission: EM | Admit: 2017-04-20 | Discharge: 2017-04-20 | Disposition: A | Discharge status: Home / Self Care | Payer: BC Managed Care – PPO | Attending: Emergency Medicine | Admitting: Emergency Medicine

## 2017-04-20 DIAGNOSIS — Z87891 Personal history of nicotine dependence: Secondary | ICD-10-CM | POA: Diagnosis not present

## 2017-04-20 DIAGNOSIS — J449 Chronic obstructive pulmonary disease, unspecified: Secondary | ICD-10-CM | POA: Insufficient documentation

## 2017-04-20 DIAGNOSIS — Z79899 Other long term (current) drug therapy: Secondary | ICD-10-CM | POA: Insufficient documentation

## 2017-04-20 DIAGNOSIS — R220 Localized swelling, mass and lump, head: Secondary | ICD-10-CM | POA: Diagnosis present

## 2017-04-20 DIAGNOSIS — Z7982 Long term (current) use of aspirin: Secondary | ICD-10-CM | POA: Diagnosis not present

## 2017-04-20 DIAGNOSIS — I2510 Atherosclerotic heart disease of native coronary artery without angina pectoris: Secondary | ICD-10-CM | POA: Diagnosis not present

## 2017-04-20 DIAGNOSIS — I10 Essential (primary) hypertension: Secondary | ICD-10-CM | POA: Insufficient documentation

## 2017-04-20 DIAGNOSIS — Z96653 Presence of artificial knee joint, bilateral: Secondary | ICD-10-CM | POA: Insufficient documentation

## 2017-04-20 MED ORDER — NAPROXEN 500 MG PO TABS: 500.0000 mg | ORAL_TABLET | Freq: Two times a day (BID) | ORAL | 0 refills | Status: DC

## 2017-04-20 NOTE — ED Provider Notes (Signed)
[facility] DEPT Provider Note   CSN: [id number] Arrival date & time: 04/20/17  0548     History   Chief Complaint Chief Complaint  Patient presents with  . Oral Swelling    HPI [name] is a 76 y.o. female.  HPI  This is a 76 year old female with history of asthma, coronary artery disease, COPD, hypertension who presents with right lower lip swelling. She states she woke up yesterday morning with the swelling. No known trauma. She does wear dentures. She is not on any Ace inhibitors. Denies any new exposures. Denies any tongue or throat swelling. Denies any shortness of breath. Patient reports that she used ice yesterday which improved the swelling that she woke up this morning with recurrent swelling. No pain.  Past Medical History:  Diagnosis Date  . Allergic rhinitis   . Arnold-Chiari malformation ([place])   . Asthma   . CAD (coronary artery disease)   . COPD (chronic obstructive pulmonary disease) ([place])   . DJD (degenerative joint disease)   . GERD (gastroesophageal reflux disease)   . History of absence seizures   . Hx of colonic polyps   . Hypercholesteremia   . Hypertension   . Obesity   . Positive PPD   . Reflex sympathetic dystrophy   . Varicose veins   . Venous insufficiency     Patient Active Problem List   Diagnosis Date Noted  . Shingles 02/26/2017  . COPD exacerbation ([place]) 01/07/2017  . S/P TKR (total knee replacement), bilateral 01/07/2017  . Right arm pain 05/16/2015  . Generalized anxiety disorder 08/31/2014  . Varicose veins of lower extremities with other complications 70/62/3762  . Edema 08/07/2011  . MENORRHAGIA, POSTMENOPAUSAL 08/13/2010  . CIGARETTE SMOKER 06/11/2010  . POSITIVE PPD 06/10/2010  . CARDIAC MURMUR 11/02/2009  . BRADYCARDIA 11/01/2009  . CHEST PAIN 11/01/2009  . ALLERGIC RHINITIS 04/19/2009  . Chronic obstructive airway disease with asthma ([place]) 04/18/2009  . LEG CRAMPS, NOCTURNAL 11/02/2008  . ARNOLD-CHIARI  MALFORMATION 04/09/2008  . Essential hypertension 01/17/2008  . COLONIC POLYPS 01/14/2008  . HYPERCHOLESTEROLEMIA 01/14/2008  . Obesity 01/14/2008  . REFLEX SYMPATHETIC DYSTROPHY 01/14/2008  . Coronary atherosclerosis 01/14/2008  . Venous (peripheral) insufficiency 01/14/2008  . GERD 01/14/2008  . Osteoarthritis 01/14/2008  . BACK PAIN, LUMBAR 01/14/2008  . SEIZURES, HX OF 01/14/2008    Past Surgical History:  Procedure Laterality Date  . ABDOMINAL HYSTERECTOMY    . cspine surgery  1993   for arnold-chiari malformation  . JOINT REPLACEMENT  06/05/11   Left total knee replacement  . right knee arthroscopy  12/2007   Dr. Tonita Cong  . right total knee replacement  05/2008   Dr. Tonita Cong    OB History    No data available       Home Medications    Prior to Admission medications   Medication Sig Start Date End Date Taking? Authorizing Provider  acetaminophen (TYLENOL) 500 MG tablet Take 1 tablet (500 mg total) by mouth every 6 (six) hours as needed for mild pain or moderate pain. 08/24/16   Waynetta Pean, PA-C  albuterol (PROVENTIL HFA;VENTOLIN HFA) 108 (90 BASE) MCG/ACT inhaler Inhale 2 puffs into the lungs every 4 (four) hours as needed. For wheeze or shortness of breath 03/26/12   Noralee Space, MD  aspirin 81 MG tablet Take 81 mg by mouth daily.      [provider]  budesonide-formoterol (SYMBICORT) 160-4.5 MCG/ACT inhaler Inhale 2 puffs into the lungs 2 (two) times daily.  06/19/16 05/24/19  Noralee Space, MD  famciclovir (FAMVIR) 500 MG tablet Take 1 tablet (500 mg total) by mouth 3 (three) times daily. 02/26/17   Noralee Space, MD  ferrous sulfate 325 (65 FE) MG tablet Take 325 mg by mouth daily with breakfast.    [provider]  furosemide (LASIX) 40 MG tablet TAKE 1 TABLET (40 MG TOTAL) BY MOUTH DAILY. 01/16/15   Noralee Space, MD  HYDROcodone-acetaminophen (NORCO/VICODIN) 5-325 MG tablet Take 1 tablet by mouth TID prn for pain 04/14/17   Noralee Space, MD    KLOR-CON M20 20 MEQ tablet TAKE 1 TABLET BY MOUTH DAILY 12/19/16   Noralee Space, MD  Multiple Vitamin (MULTIVITAMIN) capsule Take 1 capsule by mouth daily.      [provider]  naproxen (NAPROSYN) 500 MG tablet Take 1 tablet (500 mg total) by mouth 2 (two) times daily. Limit use to 3-5 days 04/20/17   Jori Thrall, Barbette Hair, MD  omeprazole (PRILOSEC) 20 MG capsule Take 20 mg by mouth daily.    [provider]  pravastatin (PRAVACHOL) 40 MG tablet TAKE 1 TABLET BY MOUTH EVERY DAY 11/17/16   Noralee Space, MD  traZODone (DESYREL) 100 MG tablet TAKE 1 TABLET BY MOUTH AT BEDTIME Patient not taking: Reported on 04/14/2017 04/06/17   Noralee Space, MD    Family History Family History  Problem Relation Age of Onset  . Heart disease Mother   . Stroke Mother   . Stroke Father   . Clotting disorder Father   . Colon cancer Neg Hx   . Stomach cancer Neg Hx     Social History Social History  Substance Use Topics  . Smoking status: Former Smoker    Packs/day: 0.30    Years: 12.00    Types: Cigarettes    Quit date: 01/02/2012  . Smokeless tobacco: Never Used     Comment: 1 pack per week  . Alcohol use No     Allergies   Patient has no known allergies.   Review of Systems Review of Systems  Constitutional: Negative for fever.  HENT: Negative for trouble swallowing and voice change.        Lip swelling  Respiratory: Negative for shortness of breath.   Cardiovascular: Negative for chest pain.  All other systems reviewed and are negative.    Physical Exam Updated Vital Signs BP 107/72   Pulse (!) 56   Temp 98 F (36.7 C)   Resp 18   Ht 5\' 5"  (1.651 m)   Wt 229 lb (103.9 kg)   SpO2 97%   BMI 38.11 kg/m   Physical Exam  Constitutional: She is oriented to person, place, and time. She appears well-developed and well-nourished.  ABC's intact  HENT:  Head: Normocephalic and atraumatic.  Mouth/Throat: Oropharynx is clear and moist.  1 x 1 cm nodule palpated  within the right lower lip, no overlying skin changes, no fluctuance, mild adjacent swelling  Eyes: Pupils are equal, round, and reactive to light.  Neck: Neck supple.  Cardiovascular: Normal rate, regular rhythm and normal heart sounds.   Pulmonary/Chest: Effort normal and breath sounds normal. No respiratory distress. She has no wheezes.  Lymphadenopathy:    She has no cervical adenopathy.  Neurological: She is alert and oriented to person, place, and time.  Skin: Skin is warm and dry.  Psychiatric: She has a normal mood and affect.  Nursing note and vitals reviewed.    ED Treatments /  Results  Labs (all labs ordered are listed, but only abnormal results are displayed) Labs Reviewed - No data to display  EKG  EKG Interpretation None       Radiology No results found.  Procedures Procedures (including critical care time)  Medications Ordered in ED Medications - No data to display   Initial Impression / Assessment and Plan / ED Course  I have reviewed the triage vital signs and the nursing notes.  Pertinent labs & imaging results that were available during my care of the patient were reviewed by me and considered in my medical decision making (see chart for details).     Patient presents with right lower lip swelling. This appears isolated. It is not consistent with angioedema. There is a nodular swelling within the mucosa of the right lower lip. Question oral keloid. Patient does wear dentures and would be at risk for trauma. It improved with ice. Continue ice at home. Anti-inflammatory medications. Follow-up with primary physician in oral surgery if not improving.  After history, exam, and medical workup I feel the patient has been appropriately medically screened and is safe for discharge home. Pertinent diagnoses were discussed with the patient. Patient was given return precautions.   Final Clinical Impressions(s) / ED Diagnoses   Final diagnoses:  Lip swelling      New Prescriptions New Prescriptions   NAPROXEN (NAPROSYN) 500 MG TABLET    Take 1 tablet (500 mg total) by mouth 2 (two) times daily. Limit use to 3-5 days     Merryl Hacker, MD 04/20/17 [id number]

## 2017-04-20 NOTE — ED Notes (Signed)
Pt understood dc material. NAD noted. Scritp given at [employer]

## 2017-04-20 NOTE — ED Triage Notes (Signed)
Pt c/o swelling to right lower lip noted yesterday morning when she woke up. Pt applied ice throughout the day and the swelling decreased. Pt woke up today with return of swelling. Pt appears to have bruising to inside of right lower lip.

## 2017-04-20 NOTE — Discharge Instructions (Signed)
You were seen today for facial swelling. You have a nodule in your lower lip. This may be an oral keloid. There are no signs and symptoms of infection or angioedema. Continue ice at home. Use naproxen as needed for inflammation and pain. If you have any new or worsening symptoms she needs to be reevaluated. You may ultimately need to see oral surgery.

## 2017-04-30 ENCOUNTER — Other Ambulatory Visit (INDEPENDENT_AMBULATORY_CARE_PROVIDER_SITE_OTHER): Payer: BC Managed Care – PPO

## 2017-04-30 ENCOUNTER — Encounter: Payer: Self-pay | Admitting: Family

## 2017-04-30 ENCOUNTER — Ambulatory Visit (INDEPENDENT_AMBULATORY_CARE_PROVIDER_SITE_OTHER): Payer: BC Managed Care – PPO | Admitting: Family

## 2017-04-30 VITALS — BP 126/84 | HR 61 | Temp 98.5°F | Resp 14 | Ht 65.0 in | Wt 229.0 lb

## 2017-04-30 DIAGNOSIS — K219 Gastro-esophageal reflux disease without esophagitis: Secondary | ICD-10-CM

## 2017-04-30 DIAGNOSIS — R600 Localized edema: Secondary | ICD-10-CM | POA: Diagnosis not present

## 2017-04-30 LAB — BASIC METABOLIC PANEL
BUN: 12 mg/dL (ref 6–23)
CO2: 32 mEq/L (ref 19–32)
Calcium: 9.5 mg/dL (ref 8.4–10.5)
Chloride: 105 mEq/L (ref 96–112)
Creatinine, Ser: 0.7 mg/dL (ref 0.40–1.20)
GFR: 105.12 mL/min (ref >60.00)
Glucose, Bld: 97 mg/dL (ref 70–99)
Potassium: 4.4 mEq/L (ref 3.5–5.1)
Sodium: 142 mEq/L (ref 135–145)

## 2017-04-30 MED ORDER — OMEPRAZOLE 40 MG PO CPDR: 40.0000 mg | DELAYED_RELEASE_CAPSULE | Freq: Every day | ORAL | 0 refills | Status: DC

## 2017-04-30 NOTE — Assessment & Plan Note (Signed)
Chronic worsening bilateral lower extremity edema refractory to increased level of furosemide over the course of one week. Edema is in the setting of known venous insufficiency with varicose veins. Does improve with elevation. Obtain basic metabolic panel and d-dimer. There is a little concern for DVT although cannot be ruled out. Continue elevation, decrease sodium intake, and compression socks as needed. Plan to increase furosemide pending basic metabolic profile results. Does not appear to be from liver or kidney dysfunction. Question COPD versus heart failure or venous insufficiency. Follow-up pending basic metabolic profile results and trial of diuresis.

## 2017-04-30 NOTE — Assessment & Plan Note (Signed)
Increased cough at night when lying down. Increase omeprazole to 40 mg daily. No significant findings on pulmonary exam today. Sample of Spiriva provided if symptoms do not improve with increased dosage of omeprazole. Follow-up if symptoms worsen or do not improve.

## 2017-04-30 NOTE — Patient Instructions (Signed)
Thank you for choosing [facility].  SUMMARY AND INSTRUCTIONS:  Please elevate your legs when seated.  We will check your blood work today to see if it is okay to increase your medication.   We will increase your omeprazole to see if it helps with your cough.  Trial Spiriva if cough does not improve with omeprazole.   Medication:  Your prescription(s) have been submitted to your pharmacy or been printed and provided for you. Please take as directed and contact our office if you believe you are having problem(s) with the medication(s) or have any questions.  Labs:  Please stop by the lab on the lower level of the building for your blood work. Your results will be released to [place] (or called to you) after review, usually within 72 hours after test completion. If any changes need to be made, you will be notified at that same time.  1.) The lab is open from 7:30am to 5:30 pm Monday-Friday 2.) No appointment is necessary 3.) Fasting (if needed) is 6-8 hours after food and drink; black coffee and water are okay    Follow up:  If your symptoms worsen or fail to improve, please contact our office for further instruction, or in case of emergency go directly to the emergency room at the closest medical facility.    Chronic Venous Insufficiency Chronic venous insufficiency, also called venous stasis, is a condition that prevents blood from being pumped effectively through the veins in your legs. Blood may no longer be pumped effectively from the legs back to the heart. This condition can range from mild to severe. With proper treatment, you should be able to continue with an active life. What are the causes? Chronic venous insufficiency occurs when the vein walls become stretched, weakened, or damaged, or when valves within the vein are damaged. Some common causes of this include:  High blood pressure inside the veins (venous hypertension).  Increased blood pressure in the leg  veins from long periods of sitting or standing.  A blood clot that blocks blood flow in a vein (deep vein thrombosis, DVT).  Inflammation of a vein (phlebitis) that causes a blood clot to form.  Tumors in the pelvis that cause blood to back up.  What increases the risk? The following factors may make you more likely to develop this condition:  Having a family history of this condition.  Obesity.  Pregnancy.  Living without enough physical activity or exercise (sedentary lifestyle).  Smoking.  Having a job that requires long periods of standing or sitting in one place.  Being a certain age. Women in their 26s and 62s and men in their 59s are more likely to develop this condition.  What are the signs or symptoms? Symptoms of this condition include:  Veins that are enlarged, bulging, or twisted (varicose veins).  Skin breakdown or ulcers.  Reddened or discolored skin on the front of the leg.  Brown, smooth, tight, and painful skin just above the ankle, usually on the inside of the leg (lipodermatosclerosis).  Swelling.  How is this diagnosed? This condition may be diagnosed based on:  Your medical history.  A physical exam.  Tests, such as: ? A procedure that creates an image of a blood vessel and nearby organs and provides information about blood flow through the blood vessel (duplex ultrasound). ? A procedure that tests blood flow (plethysmography). ? A procedure to look at the veins using X-ray and dye (venogram).  How is this treated? The  goals of treatment are to help you return to an active life and to minimize pain or disability. Treatment depends on the severity of your condition, and it may include:  Wearing compression stockings. These can help relieve symptoms and help prevent your condition from getting worse. However, they do not cure the condition.  Sclerotherapy. This is a procedure involving an injection of a material that "dissolves" damaged  veins.  Surgery. This may involve: ? Removing a diseased vein (vein stripping). ? Cutting off blood flow through the vein (laser ablation surgery). ? Repairing a valve.  Follow these instructions at home:  Wear compression stockings as told by your health care provider. These stockings help to prevent blood clots and reduce swelling in your legs.  Take over-the-counter and prescription medicines only as told by your health care provider.  Stay active by exercising, walking, or doing different activities. Ask your health care provider what activities are safe for you and how much exercise you need.  Drink enough fluid to keep your urine clear or pale yellow.  Do not use any products that contain nicotine or tobacco, such as cigarettes and e-cigarettes. If you need help quitting, ask your health care provider.  Keep all follow-up visits as told by your health care provider. This is important. Contact a health care provider if:  You have redness, swelling, or more pain in the affected area.  You see a red streak or line that extends up or down from the affected area.  You have skin breakdown or a loss of skin in the affected area, even if the breakdown is small.  You get an injury in the affected area. Get help right away if:  You get an injury and an open wound in the affected area.  You have severe pain that does not get better with medicine.  You have sudden numbness or weakness in the foot or ankle below the affected area, or you have trouble moving your foot or ankle.  You have a fever and you have worse or persistent symptoms.  You have chest pain.  You have shortness of breath. Summary  Chronic venous insufficiency, also called venous stasis, is a condition that prevents blood from being pumped effectively through the veins in your legs.  Chronic venous insufficiency occurs when the vein walls become stretched, weakened, or damaged, or when valves within the vein are  damaged.  Treatment for this condition depends on how severe your condition is, and it may involve wearing compression stockings or having a procedure.  Make sure you stay active by exercising, walking, or doing different activities. Ask your health care provider what activities are safe for you and how much exercise you need. This information is not intended to replace advice given to you by your health care provider. Make sure you discuss any questions you have with your health care provider. Document Released: 03/23/2007 Document Revised: 10/06/2016 Document Reviewed: 10/06/2016 Elsevier Interactive Patient Education  2017 [employer].

## 2017-04-30 NOTE — Progress Notes (Signed)
Subjective:    Patient ID: [name], female    DOB: [birth year], 76 y.o.   MRN: [id number]  Chief Complaint  Patient presents with  . Establish Care    having some bilateral leg swelling and some left leg pain    HPI:  [name] is a 76 y.o. female who  has a past medical history of Allergic rhinitis; Arnold-Chiari malformation ([place]); Asthma; CAD (coronary artery disease); COPD (chronic obstructive pulmonary disease) (HCC); DJD (degenerative joint disease); GERD (gastroesophageal reflux disease); History of absence seizures; colonic polyps; Hypercholesteremia; Hypertension; Obesity; Positive PPD; Reflex sympathetic dystrophy; Varicose veins; and Venous insufficiency. and presents today for an office visit to establish care.  Lower extremity edema - This is a new problem. Associated symptoms of edema located in her bilateral lower extremities has been going on for 2-3 months. Modifying factors include increasing furosemide to 40 mg twice daily for about 1 week thus far. Left side is worse than the right. No fevers. Previous blood work reviewed with good kidney and liver function. Timing of the symptoms is worse at night and improved in the morning after elevation. Severity is enough to effect her sleep. Does have some mild calf pain without chest pain. Does have significant history for venous insufficiency. Compression socks are used as needed. Denies sodium in her diet. Explains about a cough that is worse at night and when lying down.     No Known Allergies    Outpatient Medications Prior to Visit  Medication Sig Dispense Refill  . acetaminophen (TYLENOL) 500 MG tablet Take 1 tablet (500 mg total) by mouth every 6 (six) hours as needed for mild pain or moderate pain. 30 tablet 0  . albuterol (PROVENTIL HFA;VENTOLIN HFA) 108 (90 BASE) MCG/ACT inhaler Inhale 2 puffs into the lungs every 4 (four) hours as needed. For wheeze or shortness of breath 1 Inhaler 6  . aspirin 81 MG tablet  Take 81 mg by mouth daily.      . budesonide-formoterol (SYMBICORT) 160-4.5 MCG/ACT inhaler Inhale 2 puffs into the lungs 2 (two) times daily. 1 Inhaler 0  . famciclovir (FAMVIR) 500 MG tablet Take 1 tablet (500 mg total) by mouth 3 (three) times daily. 21 tablet 0  . ferrous sulfate 325 (65 FE) MG tablet Take 325 mg by mouth daily with breakfast.    . furosemide (LASIX) 40 MG tablet TAKE 1 TABLET (40 MG TOTAL) BY MOUTH DAILY. 90 tablet 0  . HYDROcodone-acetaminophen (NORCO/VICODIN) 5-325 MG tablet Take 1 tablet by mouth TID prn for pain 90 tablet 0  . KLOR-CON M20 20 MEQ tablet TAKE 1 TABLET BY MOUTH DAILY 90 tablet 1  . Multiple Vitamin (MULTIVITAMIN) capsule Take 1 capsule by mouth daily.      . naproxen (NAPROSYN) 500 MG tablet Take 1 tablet (500 mg total) by mouth 2 (two) times daily. Limit use to 3-5 days 30 tablet 0  . pravastatin (PRAVACHOL) 40 MG tablet TAKE 1 TABLET BY MOUTH EVERY DAY 90 tablet 3  . omeprazole (PRILOSEC) 20 MG capsule Take 20 mg by mouth daily.    . traZODone (DESYREL) 100 MG tablet TAKE 1 TABLET BY MOUTH AT BEDTIME 30 tablet 3   No facility-administered medications prior to visit.      Past Medical History:  Diagnosis Date  . Allergic rhinitis   . Arnold-Chiari malformation ([place])   . Asthma   . CAD (coronary artery disease)   . COPD (chronic obstructive pulmonary disease) ([place])   .  DJD (degenerative joint disease)   . GERD (gastroesophageal reflux disease)   . History of absence seizures   . Hx of colonic polyps   . Hypercholesteremia   . Hypertension   . Obesity   . Positive PPD   . Reflex sympathetic dystrophy   . Varicose veins   . Venous insufficiency       Past Surgical History:  Procedure Laterality Date  . ABDOMINAL HYSTERECTOMY    . cspine surgery  1993   for arnold-chiari malformation  . JOINT REPLACEMENT  06/05/11   Left total knee replacement  . right knee arthroscopy  12/2007   Dr. Tonita Cong  . right total knee replacement  05/2008    Dr. Tonita Cong      Family History  Problem Relation Age of Onset  . Heart disease Mother   . Stroke Mother   . Stroke Father   . Clotting disorder Father   . Colon cancer Neg Hx   . Stomach cancer Neg Hx       Social History   Social History  . Marital status: Widowed    Spouse name: N/A  . Number of children: 2  . Years of education: 12   Occupational History  . retired    Social History Main Topics  . Smoking status: Former Smoker    Packs/day: 0.30    Years: 12.00    Types: Cigarettes    Quit date: 01/02/2012  . Smokeless tobacco: Never Used     Comment: 1 pack per week  . Alcohol use No  . Drug use: No  . Sexual activity: Not on file   Other Topics Concern  . Not on file   Social History Narrative   Fun/Hobby: Playing cards and mingling.   Denies abuse and feels safe at home.       Review of Systems  Constitutional: Negative for chills and fever.  Respiratory: Positive for cough. Negative for chest tightness, shortness of breath and wheezing.   Cardiovascular: Positive for leg swelling. Negative for chest pain and palpitations.  Neurological: Negative for weakness and numbness.       Objective:    BP 126/84 (BP Location: Left Arm, Patient Position: Sitting, Cuff Size: Large)   Pulse 61   Temp 98.5 F (36.9 C) (Oral)   Resp 14   Ht 5\' 5"  (1.651 m)   Wt 229 lb (103.9 kg)   SpO2 96%   BMI 38.11 kg/m  Nursing note and vital signs reviewed.  Physical Exam  Constitutional: She is oriented to person, place, and time. She appears well-developed and well-nourished. No distress.  Cardiovascular: Normal rate, regular rhythm, normal heart sounds and intact distal pulses.   Moderate 2+ pitting edema located in her bilateral extremities with the left worse than the right. Pulses are intact. There is mild calf tenderness on the left with negative Homan's sign. Multiple varicosed veins noted.   Pulmonary/Chest: Effort normal and breath sounds normal.    Neurological: She is alert and oriented to person, place, and time.  Skin: Skin is warm and dry.  Psychiatric: She has a normal mood and affect. Her behavior is normal. Judgment and thought content normal.        Assessment & Plan:   Problem List Items Addressed This Visit      Digestive   GERD    Increased cough at night when lying down. Increase omeprazole to 40 mg daily. No significant findings on pulmonary exam today. Sample of Spiriva  provided if symptoms do not improve with increased dosage of omeprazole. Follow-up if symptoms worsen or do not improve.      Relevant Medications   omeprazole (PRILOSEC) 40 MG capsule     Other   Bilateral lower extremity edema - Primary    Chronic worsening bilateral lower extremity edema refractory to increased level of furosemide over the course of one week. Edema is in the setting of known venous insufficiency with varicose veins. Does improve with elevation. Obtain basic metabolic panel and d-dimer. There is a little concern for DVT although cannot be ruled out. Continue elevation, decrease sodium intake, and compression socks as needed. Plan to increase furosemide pending basic metabolic profile results. Does not appear to be from liver or kidney dysfunction. Question COPD versus heart failure or venous insufficiency. Follow-up pending basic metabolic profile results and trial of diuresis.      Relevant Orders   D-Dimer, Quantitative   Basic Metabolic Panel (BMET)       I have discontinued Ms. Montejano's traZODone. I have also changed her omeprazole. Additionally, I am having her maintain her multivitamin, aspirin, albuterol, ferrous sulfate, furosemide, budesonide-formoterol, acetaminophen, pravastatin, KLOR-CON M20, famciclovir, HYDROcodone-acetaminophen, and naproxen.   Meds ordered this encounter  Medications  . omeprazole (PRILOSEC) 40 MG capsule    Sig: Take 1 capsule (40 mg total) by mouth daily.    Dispense:  30 capsule     Refill:  0    Order Specific Question:   Supervising Provider    Answer:   Pricilla Holm A [[id number]]     Follow-up: Return in about 1 month (around 05/30/2017), or if symptoms worsen or fail to improve.  Mauricio Po, FNP

## 2017-05-01 ENCOUNTER — Other Ambulatory Visit: Payer: Self-pay | Admitting: Family

## 2017-05-01 DIAGNOSIS — R7989 Other specified abnormal findings of blood chemistry: Secondary | ICD-10-CM

## 2017-05-01 LAB — D-DIMER, QUANTITATIVE: D-Dimer, Quant: 0.72 mcg/mL FEU — ABNORMAL HIGH (ref <0.50)

## 2017-05-05 ENCOUNTER — Ambulatory Visit (HOSPITAL_COMMUNITY): Payer: BC Managed Care – PPO

## 2017-05-07 ENCOUNTER — Encounter (HOSPITAL_COMMUNITY): Payer: Self-pay

## 2017-05-07 ENCOUNTER — Ambulatory Visit (HOSPITAL_COMMUNITY)
Admission: RE | Admit: 2017-05-07 | Discharge: 2017-05-07 | Disposition: A | Discharge status: Home / Self Care | Payer: BC Managed Care – PPO | Source: Ambulatory Visit | Attending: Family | Admitting: Family

## 2017-05-07 DIAGNOSIS — M79605 Pain in left leg: Secondary | ICD-10-CM

## 2017-05-07 DIAGNOSIS — M7989 Other specified soft tissue disorders: Principal | ICD-10-CM

## 2017-05-07 DIAGNOSIS — R7989 Other specified abnormal findings of blood chemistry: Secondary | ICD-10-CM

## 2017-05-07 NOTE — Progress Notes (Signed)
**  Preliminary report by tech**  Bilateral lower extremity venous duplex completed. There is no evidence of deep or superficial vein thrombosis involving the right and left lower extremities. All visualized vessels appear patent and compressible. There is no evidence of Baker's cysts bilaterally. Results were given to [facility] at [place] office.  05/07/17 10:15 AM Carlos Levering RVT

## 2017-06-01 ENCOUNTER — Ambulatory Visit (INDEPENDENT_AMBULATORY_CARE_PROVIDER_SITE_OTHER): Payer: BC Managed Care – PPO | Admitting: Family Medicine

## 2017-06-01 ENCOUNTER — Other Ambulatory Visit: Payer: Self-pay | Admitting: Family

## 2017-06-01 ENCOUNTER — Encounter: Payer: Self-pay | Admitting: Family Medicine

## 2017-06-01 VITALS — BP 124/80 | HR 85 | Temp 98.6°F | Resp 14 | Ht 65.0 in | Wt 223.0 lb

## 2017-06-01 DIAGNOSIS — R600 Localized edema: Secondary | ICD-10-CM | POA: Diagnosis not present

## 2017-06-01 DIAGNOSIS — M7989 Other specified soft tissue disorders: Principal | ICD-10-CM

## 2017-06-01 DIAGNOSIS — L299 Pruritus, unspecified: Secondary | ICD-10-CM | POA: Diagnosis not present

## 2017-06-01 DIAGNOSIS — M79605 Pain in left leg: Secondary | ICD-10-CM

## 2017-06-01 NOTE — Patient Instructions (Signed)
If itching continues, can use some over the counter hydrocortisone cream twice a day for a week  Elevate leg, wear compression stockings and stop frequently to walk around

## 2017-06-01 NOTE — Progress Notes (Signed)
Subjective:    Patient ID: [name], female    DOB: 11-23-1941, 76 y.o.   MRN: [id number]  HPI This is a 76 yo female who presents today with rash on her back x 1 week. Very itchy, but not itching today No new soaps, lotions, shampoo, no new medication.   Leaving for [place] for a week. Continues to have left LE edema. Had recent negative doppler US. Was told to wear compression stockings and elevate. Swelling decreased to normal by morning, then increases as day progresses.   Past Medical History:  Diagnosis Date  . Allergic rhinitis   . Arnold-Chiari malformation ([place])   . Asthma   . CAD (coronary artery disease)   . COPD (chronic obstructive pulmonary disease) ([place])   . DJD (degenerative joint disease)   . GERD (gastroesophageal reflux disease)   . History of absence seizures   . Hx of colonic polyps   . Hypercholesteremia   . Hypertension   . Obesity   . Positive PPD   . Reflex sympathetic dystrophy   . Varicose veins   . Venous insufficiency    Past Surgical History:  Procedure Laterality Date  . ABDOMINAL HYSTERECTOMY    . cspine surgery  1993   for arnold-chiari malformation  . JOINT REPLACEMENT  06/05/11   Left total knee replacement  . right knee arthroscopy  12/2007   Dr. Tonita Cong  . right total knee replacement  05/2008   Dr. Tonita Cong   Family History  Problem Relation Age of Onset  . Heart disease Mother   . Stroke Mother   . Stroke Father   . Clotting disorder Father   . Colon cancer Neg Hx   . Stomach cancer Neg Hx    Social History  Substance Use Topics  . Smoking status: Former Smoker    Packs/day: 0.30    Years: 12.00    Types: Cigarettes    Quit date: 01/02/2012  . Smokeless tobacco: Never Used     Comment: 1 pack per week  . Alcohol use No      Review of Systems Per HPI    Objective:   Physical Exam  Constitutional: She is oriented to person, place, and time. She appears well-developed and well-nourished. No distress.  obese  HENT:    Head: Normocephalic and atraumatic.  Cardiovascular: Normal rate, regular rhythm and normal heart sounds.   Pulmonary/Chest: Effort normal and breath sounds normal.  Musculoskeletal: She exhibits edema.  Neurological: She is alert and oriented to person, place, and time. Abnormal coordination: L>R, left +2.  Skin: Skin is warm and dry. No rash noted. She is not diaphoretic. No erythema.  Back without rash or erythema.   Psychiatric: She has a normal mood and affect. Her behavior is normal. Judgment and thought content normal.  Vitals reviewed.     BP 124/80 (BP Location: Left Arm, Patient Position: Sitting, Cuff Size: Normal)   Pulse 85   Temp 98.6 F (37 C) (Oral)   Resp 14   Ht 5\' 5"  (1.651 m)   Wt 223 lb (101.2 kg)   SpO2 96%   BMI 37.11 kg/m  Wt Readings from Last 3 Encounters:  06/01/17 223 lb (101.2 kg)  04/30/17 229 lb (103.9 kg)  04/20/17 229 lb (103.9 kg)       Assessment & Plan:  1. Bilateral lower extremity edema - discussed importance of elevating legs while sitting, wearing compression socks (patient reports she has these at home), stopping  and walking frequently when traveling  2. Itching - improved today and no abnormal findings on physical exam, ? Transient contact irritation - if recurs, can use OTC hydrocortisone cream   Clarene Reamer, FNP-BC  [facility]  06/01/2017 12:53 PM

## 2017-06-02 ENCOUNTER — Ambulatory Visit: Payer: BC Managed Care – PPO | Admitting: Sports Medicine

## 2017-06-02 ENCOUNTER — Other Ambulatory Visit: Payer: Self-pay | Admitting: Family

## 2017-06-18 ENCOUNTER — Ambulatory Visit (INDEPENDENT_AMBULATORY_CARE_PROVIDER_SITE_OTHER): Payer: BC Managed Care – PPO | Admitting: Family

## 2017-06-18 ENCOUNTER — Encounter: Payer: Self-pay | Admitting: Family

## 2017-06-18 VITALS — BP 116/86 | HR 64 | Temp 98.0°F | Resp 16 | Ht 65.0 in | Wt 231.0 lb

## 2017-06-18 DIAGNOSIS — L03116 Cellulitis of left lower limb: Secondary | ICD-10-CM

## 2017-06-18 MED ORDER — SULFAMETHOXAZOLE-TRIMETHOPRIM 800-160 MG PO TABS: 1.0000 | ORAL_TABLET | Freq: Two times a day (BID) | ORAL | 0 refills | Status: DC

## 2017-06-18 MED ORDER — FUROSEMIDE 40 MG PO TABS: 80.0000 mg | ORAL_TABLET | Freq: Every day | ORAL | 0 refills | Status: DC

## 2017-06-18 MED ORDER — GABAPENTIN 300 MG PO CAPS: 300.0000 mg | ORAL_CAPSULE | Freq: Every day | ORAL | 0 refills | Status: DC

## 2017-06-18 NOTE — Assessment & Plan Note (Addendum)
Symptoms and exam consistent with cellulitis of the left lower extremity secondary to edema. Previous DVT workup negative. Start Bactrim. Increase furosemide to 120 mg 4 days then baseline of 80 mg daily. Elevate legs at home and follow sodium diet. Start gabapentin for burning pain. Follow-up if symptoms worsen or do not improve.

## 2017-06-18 NOTE — Patient Instructions (Addendum)
Thank you for choosing [facility].  SUMMARY AND INSTRUCTIONS:   Please start the Bactrim for infection of your leg.  Start gabapentin as needed at night for the burning pain.  Increase the furosemide to 3 tablets daily through Sunday and then 2 tablets daily.  Continue to take the potassium  Elevate your leg when seated.   Continue to follow a low sodium diet.   Follow up in 1 month or sooner   Medication:  Your prescription(s) have been submitted to your pharmacy or been printed and provided for you. Please take as directed and contact our office if you believe you are having problem(s) with the medication(s) or have any questions.   Follow up:  If your symptoms worsen or fail to improve, please contact our office for further instruction, or in case of emergency go directly to the emergency room at the closest medical facility.    Cellulitis, Adult Cellulitis is a skin infection. The infected area is usually red and sore. This condition occurs most often in the arms and lower legs. It is very important to get treated for this condition. Follow these instructions at home:  Take over-the-counter and prescription medicines only as told by your doctor.  If you were prescribed an antibiotic medicine, take it as told by your doctor. Do not stop taking the antibiotic even if you start to feel better.  Drink enough fluid to keep your pee (urine) clear or pale yellow.  Do not touch or rub the infected area.  Raise (elevate) the infected area above the level of your heart while you are sitting or lying down.  Place warm or cold wet cloths (warm or cold compresses) on the infected area. Do this as told by your doctor.  Keep all follow-up visits as told by your doctor. This is important. These visits let your doctor make sure your infection is not getting worse. Contact a doctor if:  You have a fever.  Your symptoms do not get better after 1-2 days of treatment.  Your  bone or joint under the infected area starts to hurt after the skin has healed.  Your infection comes back. This can happen in the same area or another area.  You have a swollen bump in the infected area.  You have new symptoms.  You feel ill and also have muscle aches and pains. Get help right away if:  Your symptoms get worse.  You feel very sleepy.  You throw up (vomit) or have watery poop (diarrhea) for a long time.  There are red streaks coming from the infected area.  Your red area gets larger.  Your red area turns darker. This information is not intended to replace advice given to you by your health care provider. Make sure you discuss any questions you have with your health care provider. Document Released: 05/05/2008 Document Revised: 04/24/2016 Document Reviewed: 09/26/2015 Elsevier Interactive Patient Education  2018 [employer].

## 2017-06-18 NOTE — Progress Notes (Signed)
Subjective:    Patient ID: [name], female    DOB: [birth year], 76 y.o.   MRN: [id number]  Chief Complaint  Patient presents with  . Follow-up    left leg is still in pain, has burning feeling, and swelling, has been been taking OTC pain medication that has not helped    HPI:  [name] is a 76 y.o. female who  has a past medical history of Allergic rhinitis; Arnold-Chiari malformation ([place]); Asthma; CAD (coronary artery disease); COPD (chronic obstructive pulmonary disease) (HCC); DJD (degenerative joint disease); GERD (gastroesophageal reflux disease); History of absence seizures; colonic polyps; Hypercholesteremia; Hypertension; Obesity; Positive PPD; Reflex sympathetic dystrophy; Varicose veins; and Venous insufficiency. and presents today for a follow up office visit.  Continues to experience the associated symptom of pain and edema located in her left lower leg that is described as burning. Previously evaluated for DVT with doppler being negative. Denies new trauma or injury. Pain and edema have been refractory to the modifying factors of elevation and OTC pain medication. Working on a low sodium diet. Course of the symptoms is getting. Taking her Lasix with some improvement. No shortness of breath or chest pain.    No Known Allergies    Outpatient Medications Prior to Visit  Medication Sig Dispense Refill  . acetaminophen (TYLENOL) 500 MG tablet Take 1 tablet (500 mg total) by mouth every 6 (six) hours as needed for mild pain or moderate pain. 30 tablet 0  . albuterol (PROVENTIL HFA;VENTOLIN HFA) 108 (90 BASE) MCG/ACT inhaler Inhale 2 puffs into the lungs every 4 (four) hours as needed. For wheeze or shortness of breath 1 Inhaler 6  . aspirin 81 MG tablet Take 81 mg by mouth daily.      . budesonide-formoterol (SYMBICORT) 160-4.5 MCG/ACT inhaler Inhale 2 puffs into the lungs 2 (two) times daily. 1 Inhaler 0  . famciclovir (FAMVIR) 500 MG tablet Take 1 tablet (500 mg  total) by mouth 3 (three) times daily. 21 tablet 0  . ferrous sulfate 325 (65 FE) MG tablet Take 325 mg by mouth daily with breakfast.    . HYDROcodone-acetaminophen (NORCO/VICODIN) 5-325 MG tablet Take 1 tablet by mouth TID prn for pain 90 tablet 0  . KLOR-CON M20 20 MEQ tablet TAKE 1 TABLET BY MOUTH DAILY 90 tablet 1  . Multiple Vitamin (MULTIVITAMIN) capsule Take 1 capsule by mouth daily.      . naproxen (NAPROSYN) 500 MG tablet Take 1 tablet (500 mg total) by mouth 2 (two) times daily. Limit use to 3-5 days 30 tablet 0  . omeprazole (PRILOSEC) 40 MG capsule TAKE ONE CAPSULE BY MOUTH EVERY DAY 90 capsule 0  . pravastatin (PRAVACHOL) 40 MG tablet TAKE 1 TABLET BY MOUTH EVERY DAY 90 tablet 3  . furosemide (LASIX) 40 MG tablet TAKE 1 TABLET (40 MG TOTAL) BY MOUTH DAILY. 90 tablet 0   No facility-administered medications prior to visit.        Past Medical History:  Diagnosis Date  . Allergic rhinitis   . Arnold-Chiari malformation ([place])   . Asthma   . CAD (coronary artery disease)   . COPD (chronic obstructive pulmonary disease) ([place])   . DJD (degenerative joint disease)   . GERD (gastroesophageal reflux disease)   . History of absence seizures   . Hx of colonic polyps   . Hypercholesteremia   . Hypertension   . Obesity   . Positive PPD   . Reflex sympathetic dystrophy   .  Varicose veins   . Venous insufficiency       Review of Systems  Constitutional: Negative for chills and fever.  Respiratory: Negative for chest tightness and shortness of breath.   Cardiovascular: Positive for leg swelling. Negative for chest pain.      Objective:    BP 116/86 (BP Location: Left Arm, Patient Position: Sitting, Cuff Size: Large)   Pulse 64   Temp 98 F (36.7 C) (Oral)   Resp 16   Ht 5\' 5"  (1.651 m)   Wt 231 lb (104.8 kg)   SpO2 96%   BMI 38.44 kg/m  Nursing note and vital signs reviewed.  Physical Exam  Constitutional: She is oriented to person, place, and time. She  appears well-developed and well-nourished. No distress.  Cardiovascular: Normal rate, regular rhythm, normal heart sounds and intact distal pulses.  Exam reveals no gallop and no friction rub.   No murmur heard. Bilateral lower extremity edema worse on the left side with increased warmth and tenderness. Pulses are intact and appropriate.   Pulmonary/Chest: Effort normal and breath sounds normal. No respiratory distress. She has no wheezes. She has no rales. She exhibits no tenderness.  Neurological: She is alert and oriented to person, place, and time.  Skin: Skin is warm and dry.  Psychiatric: She has a normal mood and affect. Her behavior is normal. Judgment and thought content normal.       Assessment & Plan:   Problem List Items Addressed This Visit      Other   Cellulitis of left lower extremity - Primary    Symptoms and exam consistent with cellulitis of the left lower extremity secondary to edema. Previous DVT workup negative. Start Bactrim. Increase furosemide to 120 mg 4 days then baseline of 80 mg daily. Elevate legs at home and follow sodium diet. Start gabapentin for burning pain. Follow-up if symptoms worsen or do not improve.          I have changed Ms. Talavera's furosemide. I am also having her start on gabapentin and sulfamethoxazole-trimethoprim. Additionally, I am having her maintain her multivitamin, aspirin, albuterol, ferrous sulfate, budesonide-formoterol, acetaminophen, pravastatin, KLOR-CON M20, famciclovir, HYDROcodone-acetaminophen, naproxen, and omeprazole.   Meds ordered this encounter  Medications  . furosemide (LASIX) 40 MG tablet    Sig: Take 2 tablets (80 mg total) by mouth daily.    Dispense:  180 tablet    Refill:  0    Order Specific Question:   Supervising Provider    Answer:   Pricilla Holm A [[id number]]  . gabapentin (NEURONTIN) 300 MG capsule    Sig: Take 1 capsule (300 mg total) by mouth at bedtime.    Dispense:  30 capsule    Refill:  0      Order Specific Question:   Supervising Provider    Answer:   Pricilla Holm A [[id number]]  . sulfamethoxazole-trimethoprim (BACTRIM DS,SEPTRA DS) 800-160 MG tablet    Sig: Take 1 tablet by mouth 2 (two) times daily.    Dispense:  14 tablet    Refill:  0    Order Specific Question:   Supervising Provider    Answer:   Pricilla Holm A [[id number]]     Follow-up: Return if symptoms worsen or fail to improve.  Mauricio Po, FNP

## 2017-06-19 LAB — HM MAMMOGRAPHY

## 2017-06-23 ENCOUNTER — Encounter: Payer: Self-pay | Admitting: Family

## 2017-06-29 ENCOUNTER — Telehealth: Payer: Self-pay | Admitting: Family

## 2017-06-29 NOTE — Telephone Encounter (Signed)
Pt called checking to see if she should have a refill sent on her Bactrum and Gabapentin. He leg is not a painful but she still has a some pain and it swells in the morning. Please advise.

## 2017-06-29 NOTE — Telephone Encounter (Signed)
Please advise 

## 2017-06-29 NOTE — Telephone Encounter (Signed)
Is her leg as warm and red as it was previously? She can continue the gabapentin but unlikely needing the Bactrim.

## 2017-06-30 ENCOUNTER — Ambulatory Visit (INDEPENDENT_AMBULATORY_CARE_PROVIDER_SITE_OTHER): Payer: BC Managed Care – PPO | Admitting: Sports Medicine

## 2017-06-30 DIAGNOSIS — Q828 Other specified congenital malformations of skin: Secondary | ICD-10-CM

## 2017-06-30 DIAGNOSIS — I739 Peripheral vascular disease, unspecified: Secondary | ICD-10-CM | POA: Diagnosis not present

## 2017-06-30 DIAGNOSIS — M79671 Pain in right foot: Secondary | ICD-10-CM | POA: Diagnosis not present

## 2017-06-30 DIAGNOSIS — B351 Tinea unguium: Secondary | ICD-10-CM | POA: Diagnosis not present

## 2017-06-30 DIAGNOSIS — Z89422 Acquired absence of other left toe(s): Secondary | ICD-10-CM

## 2017-06-30 DIAGNOSIS — M79672 Pain in left foot: Secondary | ICD-10-CM

## 2017-06-30 MED ORDER — SULFAMETHOXAZOLE-TRIMETHOPRIM 800-160 MG PO TABS: 1.0000 | ORAL_TABLET | Freq: Two times a day (BID) | ORAL | 0 refills | Status: DC

## 2017-06-30 NOTE — Telephone Encounter (Signed)
Additional Bactrim sent to pharmacy. Follow up if symptoms persist or do not improve.

## 2017-06-30 NOTE — Progress Notes (Signed)
Patient ID: [name], female   DOB: [birth year], 76 y.o.   MRN: [id number] Subjective: [name] is a 76 y.o. female patient who retunrs to office for evaluation of Left>right foot pain secondary to callus skin and nail trim.  Patient denies any other pedal complaints.   Patient Active Problem List   Diagnosis Date Noted  . Cellulitis of left lower extremity 06/18/2017  . Bilateral lower extremity edema 04/30/2017  . Shingles 02/26/2017  . COPD exacerbation ([place]) 01/07/2017  . S/P TKR (total knee replacement), bilateral 01/07/2017  . Right arm pain 05/16/2015  . Generalized anxiety disorder 08/31/2014  . Varicose veins of lower extremities with other complications 29/93/7169  . Edema 08/07/2011  . MENORRHAGIA, POSTMENOPAUSAL 08/13/2010  . CIGARETTE SMOKER 06/11/2010  . POSITIVE PPD 06/10/2010  . CARDIAC MURMUR 11/02/2009  . BRADYCARDIA 11/01/2009  . CHEST PAIN 11/01/2009  . ALLERGIC RHINITIS 04/19/2009  . Chronic obstructive airway disease with asthma ([place]) 04/18/2009  . LEG CRAMPS, NOCTURNAL 11/02/2008  . 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 Essential hypertension 01/17/2008  . COLONIC POLYPS 01/14/2008  . HYPERCHOLESTEROLEMIA 01/14/2008  . Obesity 01/14/2008  . REFLEX SYMPATHETIC DYSTROPHY 01/14/2008  . Coronary atherosclerosis 01/14/2008  . Venous (peripheral) insufficiency 01/14/2008  . GERD 01/14/2008  . Osteoarthritis 01/14/2008  . BACK PAIN, LUMBAR 01/14/2008  . SEIZURES, HX OF 01/14/2008    Current Outpatient Prescriptions on File Prior to Visit  Medication Sig Dispense Refill  . acetaminophen (TYLENOL) 500 MG tablet Take 1 tablet (500 mg total) by mouth every 6 (six) hours as needed for mild pain or moderate pain. 30 tablet 0  . albuterol (PROVENTIL HFA;VENTOLIN HFA) 108 (90 BASE) MCG/ACT inhaler Inhale 2 puffs into the lungs every 4 (four) hours as needed. For wheeze or shortness of breath 1 Inhaler 6  . aspirin 81 MG tablet Take 81 mg by mouth daily.       . budesonide-formoterol (SYMBICORT) 160-4.5 MCG/ACT inhaler Inhale 2 puffs into the lungs 2 (two) times daily. 1 Inhaler 0  . famciclovir (FAMVIR) 500 MG tablet Take 1 tablet (500 mg total) by mouth 3 (three) times daily. 21 tablet 0  . ferrous sulfate 325 (65 FE) MG tablet Take 325 mg by mouth daily with breakfast.    . furosemide (LASIX) 40 MG tablet Take 2 tablets (80 mg total) by mouth daily. 180 tablet 0  . gabapentin (NEURONTIN) 300 MG capsule Take 1 capsule (300 mg total) by mouth at bedtime. 30 capsule 0  . HYDROcodone-acetaminophen (NORCO/VICODIN) 5-325 MG tablet Take 1 tablet by mouth TID prn for pain 90 tablet 0  . KLOR-CON M20 20 MEQ tablet TAKE 1 TABLET BY MOUTH DAILY 90 tablet 1  . Multiple Vitamin (MULTIVITAMIN) capsule Take 1 capsule by mouth daily.      . naproxen (NAPROSYN) 500 MG tablet Take 1 tablet (500 mg total) by mouth 2 (two) times daily. Limit use to 3-5 days 30 tablet 0  . omeprazole (PRILOSEC) 40 MG capsule TAKE ONE CAPSULE BY MOUTH EVERY DAY 90 capsule 0  . pravastatin (PRAVACHOL) 40 MG tablet TAKE 1 TABLET BY MOUTH EVERY DAY 90 tablet 3  . sulfamethoxazole-trimethoprim (BACTRIM DS,SEPTRA DS) 800-160 MG tablet Take 1 tablet by mouth 2 (two) times daily. 14 tablet 0   No current facility-administered medications on file prior to visit.     No Known Allergies  Objective:  General: Alert and oriented x3 in no acute distress  Dermatology: Minimal Keratotic lesions present right hallux distal tuft,  left hallux and 1st MTPJ with skin lines transversing the lesion, no pain is present with direct pressure to the lesions with a central nucleated core noted, no webspace macerations, no ecchymosis bilateral, nails elongated and dystrophic with subungal debris with mild lifting at left hallux nail distally with no signs of infection.   Vascular: Dorsalis Pedis and Posterior Tibial pedal pulses 1/4, Capillary Fill Time 3 seconds, + scant pedal hair growth bilateral, no  edema bilateral lower extremities, Temperature gradient within normal limits.  Neurology: Johney Maine sensation intact via light touch bilateral.  Musculoskeletal: No tenderness with palpation at the keratotic lesions site on Left>right foot, Muscular strength 5/5 in all groups without pain or limitation on range of motion. Bunion Left>Right. 2nd toe amputation status on left that is causing bunion to be more progressive with keratosis.   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  -Mechanically debrided callus 2 using sterile chisel blade without incidentncouraged daily skin emollients -Advised good supportive shoes and inserts for foot type -Continue with toe protector and spacers bilateral to assist to offload callus areas  -Patient to return to office as needed/3 months or sooner if condition worsens.  Landis Martins, DPM

## 2017-06-30 NOTE — Telephone Encounter (Signed)
Pt aware.

## 2017-06-30 NOTE — Addendum Note (Signed)
Addended by: Delice Bison E on: 06/30/2017 11:35 AM   Modules accepted: Orders

## 2017-06-30 NOTE — Telephone Encounter (Signed)
Pt called back stating her leg is not warm or red just still a little swollen and still in pain. She would like to know what to do for the pain. Please advise . She states she is out of Bactrim but still has the gabapentin

## 2017-07-01 ENCOUNTER — Telehealth: Payer: Self-pay | Admitting: Pulmonary Disease

## 2017-07-01 NOTE — Telephone Encounter (Signed)
lmtcb X1 for pt  

## 2017-07-02 ENCOUNTER — Other Ambulatory Visit: Payer: Self-pay | Admitting: Pulmonary Disease

## 2017-07-02 NOTE — Telephone Encounter (Signed)
lmomtcb x 2 for the pt.  

## 2017-07-02 NOTE — Telephone Encounter (Signed)
lmtcb x1 for pt. 

## 2017-07-02 NOTE — Telephone Encounter (Signed)
[name] calling back about request for pain medication [phone]

## 2017-07-03 ENCOUNTER — Telehealth: Payer: Self-pay | Admitting: Family

## 2017-07-03 NOTE — Telephone Encounter (Signed)
Patient is returning phone call. [phone] or [phone]

## 2017-07-03 NOTE — Telephone Encounter (Signed)
Pt calling back about rx

## 2017-07-03 NOTE — Telephone Encounter (Signed)
Called and spoke with pt and she stated that she needs a refill of the hydrocodone for her leg pain.  Pt is aware that she would have to come and pick this rx up.  SN please advise if you are ok to refill.  Thanks

## 2017-07-03 NOTE — Telephone Encounter (Signed)
Spoke with pt. She is aware of SN's recommendation. Nothing further was needed.

## 2017-07-03 NOTE — Telephone Encounter (Signed)
Per SN---  Pt will need to see if she can get this refilled by her PCP---SN is only doing pulmonary now, and this is not related to that care.     I have called and lmomtcb x 1 for the pt.

## 2017-07-03 NOTE — Telephone Encounter (Signed)
I attempted to call the number left from the last message and this is NOT the pts number.  I have called the home number and lmomtcb x 2 for the pt.

## 2017-07-03 NOTE — Telephone Encounter (Signed)
Pt called in and said she was here last week and greg asked her if she need refill on pain meds and she told him no but she stated that she is out of them now and would like for him to write her a script for some?

## 2017-07-06 MED ORDER — HYDROCODONE-ACETAMINOPHEN 5-325 MG PO TABS: ORAL_TABLET | ORAL | 0 refills | Status: DC

## 2017-07-06 NOTE — Telephone Encounter (Signed)
Medication refilled

## 2017-07-06 NOTE — Addendum Note (Signed)
Addended by: Mauricio Po D on: 07/06/2017 02:52 PM   Modules accepted: Orders

## 2017-07-06 NOTE — Telephone Encounter (Signed)
Pt came by the office checking on this.

## 2017-07-20 ENCOUNTER — Ambulatory Visit (INDEPENDENT_AMBULATORY_CARE_PROVIDER_SITE_OTHER): Payer: BC Managed Care – PPO | Admitting: Family

## 2017-07-20 ENCOUNTER — Encounter: Payer: Self-pay | Admitting: Family

## 2017-07-20 VITALS — BP 110/80 | HR 56 | Temp 98.6°F | Resp 16 | Ht 65.0 in | Wt 227.0 lb

## 2017-07-20 DIAGNOSIS — M79662 Pain in left lower leg: Secondary | ICD-10-CM

## 2017-07-20 NOTE — Patient Instructions (Signed)
Thank you for choosing [facility].  SUMMARY AND INSTRUCTIONS:  The infection looks to have cleared up.   Increase gabapentin to 300 mg in the morning and 600 mg at night.   Continue with elevation and compression.   Follow up after your MRI results with [facility].   Medication:  Your prescription(s) have been submitted to your pharmacy or been printed and provided for you. Please take as directed and contact our office if you believe you are having problem(s) with the medication(s) or have any questions.  Follow up:  If your symptoms worsen or fail to improve, please contact our office for further instruction, or in case of emergency go directly to the emergency room at the closest medical facility.

## 2017-07-20 NOTE — Progress Notes (Signed)
Subjective:    Patient ID: [name], female    DOB: 03-Jun-1941, 76 y.o.   MRN: [id number]  Chief Complaint  Patient presents with  . Follow-up    left leg still in pain, getting MRI on friday     HPI:  [name] is a 76 y.o. female who  has a past medical history of Allergic rhinitis; Arnold-Chiari malformation ([place]); Asthma; CAD (coronary artery disease); COPD (chronic obstructive pulmonary disease) (HCC); DJD (degenerative joint disease); GERD (gastroesophageal reflux disease); History of absence seizures; colonic polyps; Hypercholesteremia; Hypertension; Obesity; Positive PPD; Reflex sympathetic dystrophy; Varicose veins; and Venous insufficiency. and presents today for a f    Previously prescribed Bactrim with concern for cellulitis. Reports taking the medication as prescribed and denies adverse side effects. Has noted significant improvements in edema and redness with completion of medication and increased dosage of furosemide. Continues to experience the associated symptom of pain located in her left leg primarily in the anterior aspect described as achy with the occasional burning sensation. Modifying factors include gabapentin which she reports taking as prescribed and denies adverse side effects. She is scheduled for an MRI through [facility]. She continues to elevate her legs when seated and use compressions stockings. No fevers, chills, redness, or discharge.   No Known Allergies    Outpatient Medications Prior to Visit  Medication Sig Dispense Refill  . acetaminophen (TYLENOL) 500 MG tablet Take 1 tablet (500 mg total) by mouth every 6 (six) hours as needed for mild pain or moderate pain. 30 tablet 0  . albuterol (PROVENTIL HFA;VENTOLIN HFA) 108 (90 BASE) MCG/ACT inhaler Inhale 2 puffs into the lungs every 4 (four) hours as needed. For wheeze or shortness of breath 1 Inhaler 6  . aspirin 81 MG tablet Take 81 mg by mouth daily.      . budesonide-formoterol  (SYMBICORT) 160-4.5 MCG/ACT inhaler Inhale 2 puffs into the lungs 2 (two) times daily. 1 Inhaler 0  . famciclovir (FAMVIR) 500 MG tablet Take 1 tablet (500 mg total) by mouth 3 (three) times daily. 21 tablet 0  . ferrous sulfate 325 (65 FE) MG tablet Take 325 mg by mouth daily with breakfast.    . furosemide (LASIX) 40 MG tablet Take 2 tablets (80 mg total) by mouth daily. 180 tablet 0  . gabapentin (NEURONTIN) 300 MG capsule Take 1 capsule (300 mg total) by mouth at bedtime. 30 capsule 0  . HYDROcodone-acetaminophen (NORCO/VICODIN) 5-325 MG tablet Take 1 tablet by mouth TID prn for pain 90 tablet 0  . KLOR-CON M20 20 MEQ tablet TAKE 1 TABLET BY MOUTH DAILY 90 tablet 1  . Multiple Vitamin (MULTIVITAMIN) capsule Take 1 capsule by mouth daily.      . naproxen (NAPROSYN) 500 MG tablet Take 1 tablet (500 mg total) by mouth 2 (two) times daily. Limit use to 3-5 days 30 tablet 0  . omeprazole (PRILOSEC) 40 MG capsule TAKE ONE CAPSULE BY MOUTH EVERY DAY 90 capsule 0  . OVER THE COUNTER MEDICATION     . pravastatin (PRAVACHOL) 40 MG tablet TAKE 1 TABLET BY MOUTH EVERY DAY 90 tablet 3  . sulfamethoxazole-trimethoprim (BACTRIM DS,SEPTRA DS) 800-160 MG tablet Take 1 tablet by mouth 2 (two) times daily. 14 tablet 0   No facility-administered medications prior to visit.      Review of Systems  Constitutional: Negative for chills, fatigue and fever.  Respiratory: Negative for chest tightness, shortness of breath and wheezing.   Cardiovascular: Positive for  leg swelling. Negative for chest pain and palpitations.  Skin: Negative for color change, rash and wound.  Neurological: Negative for weakness and numbness.      Objective:    BP 110/80 (BP Location: Left Arm, Patient Position: Sitting, Cuff Size: Large)   Pulse (!) 56   Temp 98.6 F (37 C) (Oral)   Resp 16   Ht 5\' 5"  (1.651 m)   Wt 227 lb (103 kg)   SpO2 95%   BMI 37.77 kg/m  Nursing note and vital signs reviewed.  Physical Exam    Constitutional: She is oriented to person, place, and time. She appears well-developed and well-nourished. No distress.  Cardiovascular: Normal rate, regular rhythm, normal heart sounds and intact distal pulses.   Pulmonary/Chest: Effort normal and breath sounds normal.  Musculoskeletal:  Lower left leg - mild edema with no obvious deformity or discoloration. There is generalized anterior tenderness with no calf pain noted. Ankle and knee range of motion are within normal ranges. Distal pulses and sensation are intact and appropriate.  Neurological: She is alert and oriented to person, place, and time.  Skin: Skin is warm and dry.  Psychiatric: She has a normal mood and affect. Her behavior is normal. Judgment and thought content normal.       Assessment & Plan:   Problem List Items Addressed This Visit      Other   Pain in left lower leg - Primary    Previous cellulitis and edema appears improved with completion of Bactrim. Continues to experience pain with questionable neuropathy. Has scheduled MRI with [facility] orthopedics on Friday. Increase gabapentin. Continue other medications as prescribed and follow-up pending MRI results as needed.          I have discontinued Ms. Preble's sulfamethoxazole-trimethoprim. I am also having her maintain her multivitamin, aspirin, albuterol, ferrous sulfate, budesonide-formoterol, acetaminophen, pravastatin, famciclovir, naproxen, omeprazole, furosemide, gabapentin, OVER THE COUNTER MEDICATION, KLOR-CON M20, and HYDROcodone-acetaminophen.   Follow-up: Return in about 1 month (around 08/20/2017), or if symptoms worsen or fail to improve.  Mauricio Po, FNP

## 2017-07-20 NOTE — Assessment & Plan Note (Signed)
Previous cellulitis and edema appears improved with completion of Bactrim. Continues to experience pain with questionable neuropathy. Has scheduled MRI with [facility] orthopedics on Friday. Increase gabapentin. Continue other medications as prescribed and follow-up pending MRI results as needed.

## 2017-08-06 ENCOUNTER — Telehealth: Payer: Self-pay | Admitting: Family

## 2017-08-06 MED ORDER — GABAPENTIN 300 MG PO CAPS: 300.0000 mg | ORAL_CAPSULE | Freq: Every day | ORAL | 2 refills | Status: DC

## 2017-08-06 NOTE — Telephone Encounter (Signed)
Reviewed chart pt is up-to-date [facility]   

## 2017-08-06 NOTE — Telephone Encounter (Signed)
Patient requesting refill on gabapentin.  Patient states she has not had MRI yet and she is out of medication.  [facility] [address]

## 2017-09-01 ENCOUNTER — Other Ambulatory Visit: Payer: Self-pay | Admitting: Family

## 2017-09-02 ENCOUNTER — Ambulatory Visit (INDEPENDENT_AMBULATORY_CARE_PROVIDER_SITE_OTHER): Payer: BC Managed Care – PPO | Admitting: Family Medicine

## 2017-09-02 ENCOUNTER — Encounter: Payer: Self-pay | Admitting: Family Medicine

## 2017-09-02 VITALS — BP 124/82 | HR 53 | Temp 98.9°F | Resp 16 | Ht 65.0 in | Wt 224.0 lb

## 2017-09-02 DIAGNOSIS — G9050 Complex regional pain syndrome I, unspecified: Secondary | ICD-10-CM

## 2017-09-02 DIAGNOSIS — I872 Venous insufficiency (chronic) (peripheral): Secondary | ICD-10-CM | POA: Diagnosis not present

## 2017-09-02 DIAGNOSIS — M79662 Pain in left lower leg: Secondary | ICD-10-CM | POA: Diagnosis not present

## 2017-09-02 MED ORDER — GABAPENTIN 100 MG PO CAPS: 100.0000 mg | ORAL_CAPSULE | Freq: Three times a day (TID) | ORAL | 1 refills | Status: DC

## 2017-09-02 NOTE — Assessment & Plan Note (Signed)
Likely the source of her swelling. Encouraged compression stockings.

## 2017-09-02 NOTE — Patient Instructions (Signed)
Thank you for coming in,   Please take the gabapentin and we may need to increase the medication if your pain is still present.    Please feel free to call with any questions or concerns at any time, at [phone]. --Dr. Raeford Razor

## 2017-09-02 NOTE — Assessment & Plan Note (Signed)
Investigations have yielded no specific results as to the source of her pain. Possible that she has complex regional pain syndrome and this was found upon chart review. She did have some improvement with the gabapentin. Also possible for venous stasis dermatitis. - Refilled gabapentin - Ordered a bone scan of the lower extremity to evaluate for complication of pain syndrome - If this is inconclusive could consider referral to wound center for a Unna boot
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
## 2017-09-09 ENCOUNTER — Encounter (HOSPITAL_COMMUNITY)
Admission: RE | Admit: 2017-09-09 | Discharge: 2017-09-09 | Disposition: A | Discharge status: Home / Self Care | Payer: BC Managed Care – PPO | Source: Ambulatory Visit | Attending: Family Medicine | Admitting: Family Medicine

## 2017-09-09 DIAGNOSIS — M79662 Pain in left lower leg: Secondary | ICD-10-CM | POA: Diagnosis present

## 2017-09-09 MED ORDER — TECHNETIUM TC 99M MEDRONATE IV KIT
25.0000 | PACK | Freq: Once | INTRAVENOUS | Status: AC | PRN
  Administered 2017-09-09: 25 via INTRAVENOUS

## 2017-09-10 ENCOUNTER — Telehealth: Payer: Self-pay | Admitting: Family Medicine

## 2017-09-10 DIAGNOSIS — M79662 Pain in left lower leg: Secondary | ICD-10-CM

## 2017-09-10 NOTE — Telephone Encounter (Signed)
Patient called back about results. She was informed of notes. She states she is still in pain. She wants to know if she can have something else for the pain. Please advise. Thank you.

## 2017-09-10 NOTE — Telephone Encounter (Signed)
Pt is still in pain

## 2017-09-10 NOTE — Telephone Encounter (Signed)
Pt called again asking to speak to stefannie  Was informed you were at lunch and she will get a call back when she can .

## 2017-09-12 ENCOUNTER — Other Ambulatory Visit: Payer: Self-pay | Admitting: Family

## 2017-09-14 NOTE — Telephone Encounter (Signed)
Routing to dr schmitz---per your advisement, patient will start taking gabapentin 300mg  (3 tabs) x3 daily to help with pain--she would also like urgent referral to wound center to try unna boot---nothing has really helped with the pain, she's also tried ibuprofen and tylenol--patient advised to be cautious with driving if increasing gabapentin, it can cause drowsiness---patient advised that referral will be placed, and wound center will call patient to schedule appt for unna boot

## 2017-09-18 MED ORDER — GABAPENTIN 300 MG PO CAPS: 300.0000 mg | ORAL_CAPSULE | Freq: Three times a day (TID) | ORAL | 3 refills | Status: DC

## 2017-09-18 NOTE — Telephone Encounter (Signed)
Patient notified Gabapentin sent to pharmacy.

## 2017-09-18 NOTE — Addendum Note (Signed)
Addended by: Rosemarie Ax on: 09/18/2017 03:39 PM   Modules accepted: Orders

## 2017-09-18 NOTE — Telephone Encounter (Signed)
Routing to dr schmitz---please advise, thanks

## 2017-09-18 NOTE — Telephone Encounter (Signed)
Patient is calling stating she is already out of the gabapentin. She would like a refill on this. I informed she should not be out. She states since going to 3 a day she is now out. Please follow up with patient.

## 2017-09-18 NOTE — Telephone Encounter (Signed)
Filled gabapentin 300 mg TID for her pain. Have already made a referral to wound center for unna boot.   Rosemarie Ax, MD [facility] 09/18/2017, 3:38 PM

## 2017-09-28 ENCOUNTER — Other Ambulatory Visit: Payer: Self-pay | Admitting: Pulmonary Disease

## 2017-10-20 ENCOUNTER — Encounter (HOSPITAL_BASED_OUTPATIENT_CLINIC_OR_DEPARTMENT_OTHER): Payer: BC Managed Care – PPO

## 2017-10-27 ENCOUNTER — Telehealth: Payer: Self-pay | Admitting: Pulmonary Disease

## 2017-10-27 MED ORDER — AZITHROMYCIN 250 MG PO TABS: ORAL_TABLET | ORAL | 0 refills | Status: DC

## 2017-10-27 MED ORDER — PREDNISONE 5 MG (21) PO TBPK
ORAL_TABLET | ORAL | 0 refills | Status: DC
Start: 2017-10-27 — End: 2017-11-14

## 2017-10-27 NOTE — Telephone Encounter (Signed)
Called and spoke to pt. Pt reports of prod cough with mucus & nasal drainage clear in color x3d. Pt denies any other symptoms.  [facility] [address]   SN please advise. Thanks.

## 2017-10-27 NOTE — Telephone Encounter (Signed)
Per SN---  Ok to send in zpak and pred taper per SN.  Pt is aware that these meds have been sent to her pharmacy. Nothing further is needed.   I have called and lmom to make the pt aware.

## 2017-10-30 ENCOUNTER — Other Ambulatory Visit: Payer: Self-pay | Admitting: Pulmonary Disease

## 2017-10-30 ENCOUNTER — Other Ambulatory Visit: Payer: Self-pay | Admitting: *Deleted

## 2017-10-30 MED ORDER — PRAVASTATIN SODIUM 40 MG PO TABS: 40.0000 mg | ORAL_TABLET | Freq: Every day | ORAL | 3 refills | Status: DC

## 2017-11-12 ENCOUNTER — Telehealth: Payer: Self-pay | Admitting: Pulmonary Disease

## 2017-11-12 NOTE — Telephone Encounter (Signed)
Called and spoke with pt and she stated that she has a [place] and that this has been acting up and being very painful over the last couple of weeks.  Pt stated that she increased her omeprazole to BID and this has not helped.  Pt stated that the area is very tender.  SN please advise. Thanks  No Known Allergies

## 2017-11-12 NOTE — Telephone Encounter (Signed)
Called and spoke with pt and she is aware that per SN---she will need to contact her GI doctor to follow up on her [place].

## 2017-11-14 ENCOUNTER — Ambulatory Visit: Payer: BC Managed Care – PPO | Admitting: Internal Medicine

## 2017-11-14 ENCOUNTER — Encounter: Payer: Self-pay | Admitting: Internal Medicine

## 2017-11-14 VITALS — BP 118/60 | HR 61 | Temp 98.4°F | Ht 65.0 in | Wt 222.0 lb

## 2017-11-14 DIAGNOSIS — I2510 Atherosclerotic heart disease of native coronary artery without angina pectoris: Secondary | ICD-10-CM | POA: Insufficient documentation

## 2017-11-14 DIAGNOSIS — R011 Cardiac murmur, unspecified: Secondary | ICD-10-CM

## 2017-11-14 DIAGNOSIS — R7611 Nonspecific reaction to tuberculin skin test without active tuberculosis: Secondary | ICD-10-CM | POA: Insufficient documentation

## 2017-11-14 DIAGNOSIS — Z8669 Personal history of other diseases of the nervous system and sense organs: Secondary | ICD-10-CM | POA: Insufficient documentation

## 2017-11-14 DIAGNOSIS — J45909 Unspecified asthma, uncomplicated: Secondary | ICD-10-CM | POA: Insufficient documentation

## 2017-11-14 DIAGNOSIS — Q0700 Arnold-Chiari syndrome without spina bifida or hydrocephalus: Secondary | ICD-10-CM | POA: Insufficient documentation

## 2017-11-14 DIAGNOSIS — Z0001 Encounter for general adult medical examination with abnormal findings: Secondary | ICD-10-CM | POA: Diagnosis not present

## 2017-11-14 DIAGNOSIS — Z23 Encounter for immunization: Secondary | ICD-10-CM

## 2017-11-14 DIAGNOSIS — K219 Gastro-esophageal reflux disease without esophagitis: Secondary | ICD-10-CM | POA: Diagnosis not present

## 2017-11-14 DIAGNOSIS — E2839 Other primary ovarian failure: Secondary | ICD-10-CM | POA: Diagnosis not present

## 2017-11-14 DIAGNOSIS — J449 Chronic obstructive pulmonary disease, unspecified: Secondary | ICD-10-CM | POA: Diagnosis not present

## 2017-11-14 DIAGNOSIS — M1990 Unspecified osteoarthritis, unspecified site: Secondary | ICD-10-CM | POA: Insufficient documentation

## 2017-11-14 DIAGNOSIS — R079 Chest pain, unspecified: Secondary | ICD-10-CM

## 2017-11-14 DIAGNOSIS — E7800 Pure hypercholesterolemia, unspecified: Secondary | ICD-10-CM | POA: Insufficient documentation

## 2017-11-14 DIAGNOSIS — Z86010 Personal history of colonic polyps: Secondary | ICD-10-CM | POA: Insufficient documentation

## 2017-11-14 DIAGNOSIS — Z0000 Encounter for general adult medical examination without abnormal findings: Secondary | ICD-10-CM

## 2017-11-14 DIAGNOSIS — I872 Venous insufficiency (chronic) (peripheral): Secondary | ICD-10-CM | POA: Insufficient documentation

## 2017-11-14 DIAGNOSIS — M79662 Pain in left lower leg: Secondary | ICD-10-CM

## 2017-11-14 DIAGNOSIS — I10 Essential (primary) hypertension: Secondary | ICD-10-CM

## 2017-11-14 MED ORDER — PRAVASTATIN SODIUM 40 MG PO TABS: 40.0000 mg | ORAL_TABLET | Freq: Every day | ORAL | 3 refills | Status: DC

## 2017-11-14 MED ORDER — OMEPRAZOLE 40 MG PO CPDR: DELAYED_RELEASE_CAPSULE | ORAL | 3 refills | Status: DC

## 2017-11-14 MED ORDER — POTASSIUM CHLORIDE CRYS ER 20 MEQ PO TBCR: 20.0000 meq | EXTENDED_RELEASE_TABLET | Freq: Every day | ORAL | 3 refills | Status: DC

## 2017-11-14 MED ORDER — MELOXICAM 15 MG PO TABS: 15.0000 mg | ORAL_TABLET | Freq: Every day | ORAL | 2 refills | Status: DC | PRN

## 2017-11-14 MED ORDER — FUROSEMIDE 40 MG PO TABS: 80.0000 mg | ORAL_TABLET | Freq: Every day | ORAL | 3 refills | Status: DC

## 2017-11-14 MED ORDER — GABAPENTIN 300 MG PO CAPS: 300.0000 mg | ORAL_CAPSULE | Freq: Three times a day (TID) | ORAL | 3 refills | Status: DC

## 2017-11-14 MED ORDER — TRAZODONE HCL 100 MG PO TABS: 100.0000 mg | ORAL_TABLET | Freq: Every day | ORAL | 1 refills | Status: DC

## 2017-11-14 NOTE — Patient Instructions (Addendum)
You had the tetanus Tdap shot today  Please schedule the bone density test before leaving today at the scheduling desk (where you check out)  Your EKG was OK today  Please take all new medication as prescribed - the anti-inflammatory for the chest wall pain  Please continue all other medications as before, and refills have been done if requested.  Please have the pharmacy call with any other refills you may need.  Please continue your efforts at being more active, low cholesterol diet, and weight control.  You are otherwise up to date with prevention measures today.  Please keep your appointments with your specialists as you may have planned  Please go to the LAB in the Basement (turn left off the elevator) for the tests to be done at your convenience next week  You will be contacted by phone if any changes need to be made immediately.  Otherwise, you will receive a letter about your results with an explanation, but please check with MyChart first.  Please remember to sign up for MyChart if you have not done so, as this will be important to you in the future with finding out test results, communicating by private email, and scheduling acute appointments online when needed.  Please return in 6 months, or sooner if needed

## 2017-11-14 NOTE — Progress Notes (Signed)
Subjective:    Patient ID: [name], female    DOB: 1941-07-03, 76 y.o.   MRN: [id number]  HPI    Here for wellness and f/u;  Overall doing ok;  Pt denies neurological change such as new headache, facial or extremity weakness.  Pt denies polydipsia, polyuria, or low sugar symptoms. Pt states overall good compliance with treatment and medications, good tolerability, and has been trying to follow appropriate diet.  Pt denies worsening depressive symptoms, suicidal ideation or panic. No fever, night sweats, wt loss, loss of appetite, or other constitutional symptoms.  Pt states good ability with ADL's, has low fall risk, home safety reviewed and adequate, no other significant changes in hearing or vision, and only occasionally active with exercise, less recently after onset left leg pain.  Declines Prevnar for now, but ok for Tdap.    C/o persistent 2 wks mild to mod sharp left parasenal CP- improved but worse again in last 2 days with soreness, pain (though better again today) and seems to be" moving" without rash , swelling . Pt denies other chest pain, increased sob or doe, wheezing, orthopnea, PND, increased LE swelling, palpitations, dizziness or syncope.  Was seen at ED recently.  Pain is not pleuritic, or exertonoal, but is positional in that she can feel it more to bending, twisting or turning over in bed. She conts to refer to this as GERD but Denies worsening abd pain or typical chest reflux discomfort, dysphagia, n/v, bowel change or blood, and remains compliant with PPI.   Pt does have known cardiac murmur, but has been asympt in past, and echo not done per pt.  Pain not improved with gabapentin which she has been taking per Dr Raeford Razor due to recent left leg pain which persists.  Also describes more stres recently as father at 28yo in wheelchair, sister in law at 57 just out of NH. Past Medical History:  Diagnosis Date  . Allergic rhinitis   . Arnold-Chiari malformation ([place])   . Asthma     . CAD (coronary artery disease)   . COPD (chronic obstructive pulmonary disease) ([place])   . DJD (degenerative joint disease)   . GERD (gastroesophageal reflux disease)   . History of absence seizures   . Hx of colonic polyps   . Hypercholesteremia   . Hypertension   . Obesity   . Positive PPD   . Reflex sympathetic dystrophy   . Varicose veins   . Venous insufficiency    Past Surgical History:  Procedure Laterality Date  . ABDOMINAL HYSTERECTOMY    . cspine surgery  1993   for arnold-chiari malformation  . JOINT REPLACEMENT  06/05/11   Left total knee replacement  . right knee arthroscopy  12/2007   Dr. Tonita Cong  . right total knee replacement  05/2008   Dr. Tonita Cong    reports that she quit smoking about 5 years ago. Her smoking use included cigarettes. She has a 3.60 pack-year smoking history. she has never used smokeless tobacco. She reports that she does not drink alcohol or use drugs. family history includes Clotting disorder in her father; Heart disease in her mother; Stroke in her father and mother. No Known Allergies Current Outpatient Medications on File Prior to Visit  Medication Sig Dispense Refill  . albuterol (PROVENTIL HFA;VENTOLIN HFA) 108 (90 BASE) MCG/ACT inhaler Inhale 2 puffs into the lungs every 4 (four) hours as needed. For wheeze or shortness of breath 1 Inhaler 6  . aspirin 81  MG tablet Take 81 mg by mouth daily.      . budesonide-formoterol (SYMBICORT) 160-4.5 MCG/ACT inhaler Inhale 2 puffs into the lungs 2 (two) times daily. 1 Inhaler 0  . Multiple Vitamin (MULTIVITAMIN) capsule Take 1 capsule by mouth daily.      Marland Kitchen OVER THE COUNTER MEDICATION      No current facility-administered medications on file prior to visit.    Review of Systems Constitutional: Negative for other unusual diaphoresis, sweats, appetite or weight changes HENT: Negative for other worsening hearing loss, ear pain, facial swelling, mouth sores or neck stiffness.   Eyes: Negative for other  worsening pain, redness or other visual disturbance.  Respiratory: Negative for other stridor or swelling Cardiovascular: Negative for other palpitations or other chest pain  Gastrointestinal: Negative for worsening diarrhea or loose stools, blood in stool, distention or other pain Genitourinary: Negative for hematuria, flank pain or other change in urine volume.  Musculoskeletal: Negative for myalgias or other joint swelling.  Skin: Negative for other color change, or other wound or worsening drainage.  Neurological: Negative for other syncope or numbness. Hematological: Negative for other adenopathy or swelling Psychiatric/Behavioral: Negative for hallucinations, other worsening agitation, SI, self-injury, or new decreased concentration All other system neg per pt    Objective:   Physical Exam BP 118/60 (BP Location: Left Arm, Patient Position: Sitting, Cuff Size: Large)   Pulse 61   Temp 98.4 F (36.9 C) (Oral)   Ht 5\' 5"  (1.651 m)   Wt 222 lb (100.7 kg)   SpO2 98%   BMI 36.94 kg/m  VS noted,  Constitutional: Pt is oriented to person, place, and time. Appears well-developed and well-nourished, in no significant distress and comfortable Head: Normocephalic and atraumatic  Eyes: Conjunctivae and EOM are normal. Pupils are equal, round, and reactive to light Right Ear: External ear normal without discharge Left Ear: External ear normal without discharge Nose: Nose without discharge or deformity Mouth/Throat: Oropharynx is without other ulcerations and moist  Neck: Normal range of motion. Neck supple. No JVD present. No tracheal deviation present or significant neck LA or mass Cardiovascular: Normal rate, regular rhythm, normal heart sounds and intact distal pulses.  with grade 0-2/5 systolic murmur RUSB, as well as left upper immediate parasternal chest wall tenderness Pulmonary/Chest: WOB normal and breath sounds without rales or wheezing  Abdominal: Soft. Bowel sounds are normal.  NT. No HSM  Musculoskeletal: Normal range of motion. Exhibits no edema Lymphadenopathy: Has no other cervical adenopathy.  Neurological: Pt is alert and oriented to person, place, and time. Pt has normal reflexes. No cranial nerve deficit. Motor grossly intact, Gait intact Skin: Skin is warm and dry. No rash noted or new ulcerations Psychiatric:  Has 1-2+ nervous mood and affect. Behavior is normal without agitation No other exam findings Lab Results  Component Value Date   WBC 5.4 Repeated and verified X2. 01/07/2017   HGB 12.3 01/07/2017   HCT 37.5 01/07/2017   PLT 187.0 Repeated and verified X2. 01/07/2017   GLUCOSE 97 04/30/2017   CHOL 156 12/21/2015   TRIG 76.0 12/21/2015   HDL 76.10 12/21/2015   LDLCALC 65 12/21/2015   ALT 14 01/07/2017   AST 18 01/07/2017   NA 142 04/30/2017   K 4.4 04/30/2017   CL 105 04/30/2017   CREATININE 0.70 04/30/2017   BUN 12 04/30/2017   CO2 32 04/30/2017   TSH 1.82 01/07/2017   INR 1.14 06/16/2011      Assessment & Plan:

## 2017-11-15 NOTE — Assessment & Plan Note (Signed)
stable overall by history and exam, and pt to continue medical treatment as before,  to f/u any worsening symptoms or concerns, will cont to f/u with pulm

## 2017-11-15 NOTE — Assessment & Plan Note (Signed)

## 2017-11-15 NOTE — Assessment & Plan Note (Signed)
Stable, to cont PPI,  to f/u any worsening symptoms or concerns

## 2017-11-15 NOTE — Assessment & Plan Note (Addendum)
C/w costochondritis, d/w pt nature of problem and natural hx, for nsaid prn,  to f/u any worsening symptoms or concerns  In addition to the time spent performing CPE, I spent an additional 25 minutes face to face,in which greater than 50% of this time was spent in counseling and coordination of care for patient's acute illness as documented, including the differential dx, treatment, further evaluation and other management of chest pain, heart murmur, GERD, HTN, COPD, and left leg pain

## 2017-11-15 NOTE — Assessment & Plan Note (Signed)
Asympt, to cont to monitor, doubt related to current symptoms

## 2017-11-15 NOTE — Assessment & Plan Note (Signed)
Overall stable, With some pain improvement with gabapentin, for refill today

## 2017-11-15 NOTE — Assessment & Plan Note (Signed)
BP Readings from Last 3 Encounters:  11/14/17 118/60  09/02/17 124/82  07/20/17 110/80  stable overall by history and exam, recent data reviewed with pt, and pt to continue medical treatment as before,  to f/u any worsening symptoms or concerns

## 2017-11-16 ENCOUNTER — Other Ambulatory Visit (INDEPENDENT_AMBULATORY_CARE_PROVIDER_SITE_OTHER): Payer: BC Managed Care – PPO

## 2017-11-16 DIAGNOSIS — Z0000 Encounter for general adult medical examination without abnormal findings: Secondary | ICD-10-CM | POA: Diagnosis not present

## 2017-11-16 LAB — LIPID PANEL
Cholesterol: 127 mg/dL (ref 0–200)
HDL: 51.9 mg/dL (ref >39.00)
LDL Cholesterol: 45 mg/dL (ref 0–99)
NonHDL: 75.17
Total CHOL/HDL Ratio: 2
Triglycerides: 152 mg/dL — ABNORMAL HIGH (ref 0.0–149.0)
VLDL: 30.4 mg/dL (ref 0.0–40.0)

## 2017-11-16 LAB — CBC WITH DIFFERENTIAL/PLATELET
Basophils Absolute: 0 10*3/uL (ref 0.0–0.1)
Basophils Relative: 0.6 % (ref 0.0–3.0)
Eosinophils Absolute: 0.1 10*3/uL (ref 0.0–0.7)
Eosinophils Relative: 2.5 % (ref 0.0–5.0)
HCT: 36.5 % (ref 36.0–46.0)
Hemoglobin: 11.6 g/dL — ABNORMAL LOW (ref 12.0–15.0)
Lymphocytes Relative: 44 % (ref 12.0–46.0)
Lymphs Abs: 1.5 10*3/uL (ref 0.7–4.0)
MCHC: 31.7 g/dL (ref 30.0–36.0)
MCV: 85.5 fl (ref 78.0–100.0)
Monocytes Absolute: 0.2 10*3/uL (ref 0.1–1.0)
Monocytes Relative: 7.5 % (ref 3.0–12.0)
Neutro Abs: 1.5 10*3/uL (ref 1.4–7.7)
Neutrophils Relative %: 45.4 % (ref 43.0–77.0)
Platelets: 195 10*3/uL (ref 150.0–400.0)
RBC: 4.27 Mil/uL (ref 3.87–5.11)
RDW: 14.2 % (ref 11.5–15.5)
WBC: 3.3 10*3/uL — ABNORMAL LOW (ref 4.0–10.5)

## 2017-11-16 LAB — TSH: TSH: 1.31 u[IU]/mL (ref 0.35–4.50)

## 2017-11-16 LAB — BASIC METABOLIC PANEL
BUN: 10 mg/dL (ref 6–23)
CO2: 29 mEq/L (ref 19–32)
Calcium: 8.9 mg/dL (ref 8.4–10.5)
Chloride: 106 mEq/L (ref 96–112)
Creatinine, Ser: 0.64 mg/dL (ref 0.40–1.20)
GFR: 116.4 mL/min (ref >60.00)
Glucose, Bld: 97 mg/dL (ref 70–99)
Potassium: 4.3 mEq/L (ref 3.5–5.1)
Sodium: 141 mEq/L (ref 135–145)

## 2017-11-16 LAB — URINALYSIS, ROUTINE W REFLEX MICROSCOPIC
Bilirubin Urine: NEGATIVE
Hgb urine dipstick: NEGATIVE
Ketones, ur: NEGATIVE
Leukocytes, UA: NEGATIVE
Nitrite: NEGATIVE
Specific Gravity, Urine: >=1.03 — AB (ref 1.000–1.030)
Total Protein, Urine: NEGATIVE
Urine Glucose: NEGATIVE
Urobilinogen, UA: 0.2 (ref 0.0–1.0)
pH: 5 (ref 5.0–8.0)

## 2017-11-16 LAB — HEPATIC FUNCTION PANEL
ALT: 11 U/L (ref 0–35)
AST: 15 U/L (ref 0–37)
Albumin: 3.7 g/dL (ref 3.5–5.2)
Alkaline Phosphatase: 73 U/L (ref 39–117)
Bilirubin, Direct: 0.1 mg/dL (ref 0.0–0.3)
Total Bilirubin: 0.4 mg/dL (ref 0.2–1.2)
Total Protein: 6.7 g/dL (ref 6.0–8.3)

## 2017-11-18 ENCOUNTER — Encounter: Payer: Self-pay | Admitting: Internal Medicine

## 2017-11-18 ENCOUNTER — Other Ambulatory Visit (INDEPENDENT_AMBULATORY_CARE_PROVIDER_SITE_OTHER): Payer: BC Managed Care – PPO

## 2017-11-18 DIAGNOSIS — D649 Anemia, unspecified: Secondary | ICD-10-CM | POA: Diagnosis not present

## 2017-11-18 LAB — IBC PANEL
Iron: 59 ug/dL (ref 42–145)
Saturation Ratios: 17.6 % — ABNORMAL LOW (ref 20.0–50.0)
Transferrin: 240 mg/dL (ref 212.0–360.0)

## 2017-12-09 ENCOUNTER — Ambulatory Visit (INDEPENDENT_AMBULATORY_CARE_PROVIDER_SITE_OTHER)
Admission: RE | Admit: 2017-12-09 | Discharge: 2017-12-09 | Disposition: A | Discharge status: Home / Self Care | Payer: BC Managed Care – PPO | Source: Ambulatory Visit | Attending: Family | Admitting: Family

## 2017-12-09 DIAGNOSIS — E2839 Other primary ovarian failure: Secondary | ICD-10-CM | POA: Diagnosis not present

## 2017-12-13 ENCOUNTER — Encounter: Payer: Self-pay | Admitting: Internal Medicine

## 2017-12-13 ENCOUNTER — Other Ambulatory Visit: Payer: Self-pay | Admitting: Internal Medicine

## 2017-12-13 DIAGNOSIS — E2839 Other primary ovarian failure: Secondary | ICD-10-CM | POA: Diagnosis not present

## 2017-12-13 MED ORDER — ALENDRONATE SODIUM 70 MG PO TABS: 70.0000 mg | ORAL_TABLET | ORAL | 3 refills | Status: DC

## 2017-12-14 ENCOUNTER — Telehealth: Payer: Self-pay

## 2017-12-14 NOTE — Telephone Encounter (Signed)
Called pt, LVM.   CRM created.  

## 2017-12-14 NOTE — Telephone Encounter (Signed)
-----   Message from Biagio Borg, MD sent at 12/13/2017  7:35 PM EST ----- Letter sent, cont same tx except  The test results show that your current treatment is OK, except the bone loss is significant enough that you appear to have osteopenia (not quite osteoporosis).  It is to the level that we should start treatment with fosamax to help slow down the bone loss in the future.  I will send a prescription, and you should hear from the office as well.    [name] to please inform pt, I will do rx, or pt can make rov to discuss

## 2017-12-30 ENCOUNTER — Telehealth: Payer: Self-pay

## 2017-12-30 NOTE — Telephone Encounter (Signed)
Copied from [place] [id number]. Topic: General - Other >> Dec 30, 2017  1:23 PM Carolyn Stare wrote:  Pt call to say the below med is doing her any good and that she will not be picking up this med from the pharmacy anymore. She said to cancel this RX    gabapentin (NEURONTIN) 300 MG capsule

## 2018-01-04 ENCOUNTER — Telehealth: Payer: Self-pay | Admitting: Internal Medicine

## 2018-01-04 NOTE — Telephone Encounter (Signed)
Pt has been informed and expressed understanding.  

## 2018-01-04 NOTE — Telephone Encounter (Signed)
This would be Very unlikely to be related, and there are several other common things that can do this, such as Carpal Tunnell Syndrome.  Please consider OV if any worsening such as worsening pain or less grip strength

## 2018-01-04 NOTE — Telephone Encounter (Signed)
Called and spoke with pt. Pt states since starting fosamax, she has tingles in her hands and fingers.  It appears that Dr. Jenny Reichmann prescribed this medication. I have advised pt to contact Dr. Gwynn Burly office regarding this matter. Nothing further is needed.

## 2018-01-04 NOTE — Telephone Encounter (Signed)
Pt. Called to report after starting her Fosamax, she started having "tingling" in both hands - more the left than the right. No other symptoms. Please advise pt.

## 2018-02-11 ENCOUNTER — Other Ambulatory Visit: Payer: Self-pay | Admitting: Internal Medicine

## 2018-02-17 ENCOUNTER — Ambulatory Visit: Payer: BC Managed Care – PPO | Admitting: Podiatry

## 2018-03-02 ENCOUNTER — Other Ambulatory Visit (INDEPENDENT_AMBULATORY_CARE_PROVIDER_SITE_OTHER): Payer: BC Managed Care – PPO

## 2018-03-02 ENCOUNTER — Encounter: Payer: Self-pay | Admitting: Internal Medicine

## 2018-03-02 ENCOUNTER — Ambulatory Visit: Payer: BC Managed Care – PPO | Admitting: Internal Medicine

## 2018-03-02 VITALS — BP 122/82 | HR 65 | Temp 98.3°F | Ht 65.0 in | Wt 214.0 lb

## 2018-03-02 DIAGNOSIS — F411 Generalized anxiety disorder: Secondary | ICD-10-CM

## 2018-03-02 DIAGNOSIS — R1030 Lower abdominal pain, unspecified: Secondary | ICD-10-CM | POA: Diagnosis not present

## 2018-03-02 DIAGNOSIS — R197 Diarrhea, unspecified: Secondary | ICD-10-CM | POA: Diagnosis not present

## 2018-03-02 LAB — URINALYSIS, ROUTINE W REFLEX MICROSCOPIC
Bilirubin Urine: NEGATIVE
Hgb urine dipstick: NEGATIVE
Ketones, ur: NEGATIVE
Nitrite: NEGATIVE
RBC / HPF: NONE SEEN (ref 0–?)
Specific Gravity, Urine: 1.015 (ref 1.000–1.030)
Total Protein, Urine: NEGATIVE
Urine Glucose: NEGATIVE
Urobilinogen, UA: 0.2 (ref 0.0–1.0)
pH: 7.5 (ref 5.0–8.0)

## 2018-03-02 LAB — CBC WITH DIFFERENTIAL/PLATELET
Basophils Absolute: 0.1 10*3/uL (ref 0.0–0.1)
Basophils Relative: 2 % (ref 0.0–3.0)
Eosinophils Absolute: 0.2 10*3/uL (ref 0.0–0.7)
Eosinophils Relative: 3.6 % (ref 0.0–5.0)
HCT: 38.6 % (ref 36.0–46.0)
Hemoglobin: 12.6 g/dL (ref 12.0–15.0)
Lymphocytes Relative: 43.9 % (ref 12.0–46.0)
Lymphs Abs: 2.4 10*3/uL (ref 0.7–4.0)
MCHC: 32.6 g/dL (ref 30.0–36.0)
MCV: 83.6 fl (ref 78.0–100.0)
Monocytes Absolute: 0.4 10*3/uL (ref 0.1–1.0)
Monocytes Relative: 7.1 % (ref 3.0–12.0)
Neutro Abs: 2.3 10*3/uL (ref 1.4–7.7)
Neutrophils Relative %: 43.4 % (ref 43.0–77.0)
Platelets: 157 10*3/uL (ref 150.0–400.0)
RBC: 4.61 Mil/uL (ref 3.87–5.11)
RDW: 13.9 % (ref 11.5–15.5)
WBC: 5.4 10*3/uL (ref 4.0–10.5)

## 2018-03-02 LAB — HEPATIC FUNCTION PANEL
ALT: 14 U/L (ref 0–35)
AST: 20 U/L (ref 0–37)
Albumin: 3.7 g/dL (ref 3.5–5.2)
Alkaline Phosphatase: 75 U/L (ref 39–117)
Bilirubin, Direct: 0.1 mg/dL (ref 0.0–0.3)
Total Bilirubin: 0.4 mg/dL (ref 0.2–1.2)
Total Protein: 6.9 g/dL (ref 6.0–8.3)

## 2018-03-02 LAB — BASIC METABOLIC PANEL
BUN: 11 mg/dL (ref 6–23)
CO2: 31 mEq/L (ref 19–32)
Calcium: 9.1 mg/dL (ref 8.4–10.5)
Chloride: 104 mEq/L (ref 96–112)
Creatinine, Ser: 0.64 mg/dL (ref 0.40–1.20)
GFR: 116.3 mL/min (ref >60.00)
Glucose, Bld: 84 mg/dL (ref 70–99)
Potassium: 4.5 mEq/L (ref 3.5–5.1)
Sodium: 140 mEq/L (ref 135–145)

## 2018-03-02 LAB — LIPASE: Lipase: <3 U/L — ABNORMAL LOW (ref 11.0–59.0)

## 2018-03-02 MED ORDER — METRONIDAZOLE 250 MG PO TABS: 250.0000 mg | ORAL_TABLET | Freq: Three times a day (TID) | ORAL | 0 refills | Status: AC

## 2018-03-02 MED ORDER — CIPROFLOXACIN HCL 500 MG PO TABS: 500.0000 mg | ORAL_TABLET | Freq: Two times a day (BID) | ORAL | 0 refills | Status: AC

## 2018-03-02 NOTE — Patient Instructions (Addendum)
Please take all new medication as prescribed - the 2 antibiotics for possible colitis (cipro and flagyl)  You can also take OTC Immodium or Kaopectate for loose stools or diarrhea, as needed  Please continue all other medications as before, except you should hold on taking the lasix for the days you continue to have diarrhea  Please have the pharmacy call with any other refills you may need.  Please keep your appointments with your specialists as you may have planned  Please go to the LAB in the Basement (turn left off the elevator) for the tests to be done today  You will be contacted by phone if any changes need to be made immediately.  Otherwise, you will receive a letter about your results with an explanation, but please check with MyChart first.  Please remember to sign up for MyChart if you have not done so, as this will be important to you in the future with finding out test results, communicating by private email, and scheduling acute appointments online when needed.

## 2018-03-02 NOTE — Progress Notes (Signed)
Subjective:    Patient ID: [name], female    DOB: 07-Jan-1941, 77 y.o.   MRN: [id number]  HPI  Here with c/o acute onset diarrhea now for 3-4 days, intermittent stops and starts, mild to mod, crampy lower abd pain, not assoc with  nausea/vomiting, OTC antidiarrhea have helped, did have a black stool recently with iron pill, just wondering if could be bleeding, ? Fever but has been feeling warm, no chills.  Nothing else seems to make better or worse.  Has been more nervous and upset since common law husband died last wk, no hx of IBS or chronic diarrhea.  Pt denies chest pain, increased sob or doe, wheezing, orthopnea, PND, increased LE swelling, palpitations, dizziness or syncope.  Pt denies new neurological symptoms such as new headache, or facial or extremity weakness or numbness   Pt denies polydipsia, polyuria  No other new complaint or interval hx Past Medical History:  Diagnosis Date  . Allergic rhinitis   . Arnold-Chiari malformation ([place])   . Asthma   . CAD (coronary artery disease)   . COPD (chronic obstructive pulmonary disease) ([place])   . DJD (degenerative joint disease)   . GERD (gastroesophageal reflux disease)   . History of absence seizures   . Hx of colonic polyps   . Hypercholesteremia   . Hypertension   . Obesity   . Positive PPD   . Reflex sympathetic dystrophy   . Varicose veins   . Venous insufficiency    Past Surgical History:  Procedure Laterality Date  . ABDOMINAL HYSTERECTOMY    . cspine surgery  1993   for arnold-chiari malformation  . JOINT REPLACEMENT  06/05/11   Left total knee replacement  . right knee arthroscopy  12/2007   Dr. Tonita Cong  . right total knee replacement  05/2008   Dr. Tonita Cong    reports that she quit smoking about 6 years ago. Her smoking use included cigarettes. She has a 3.60 pack-year smoking history. She has never used smokeless tobacco. She reports that she does not drink alcohol or use drugs. family history includes Clotting  disorder in her father; Heart disease in her mother; Stroke in her father and mother. No Known Allergies Current Outpatient Medications on File Prior to Visit  Medication Sig Dispense Refill  . albuterol (PROVENTIL HFA;VENTOLIN HFA) 108 (90 BASE) MCG/ACT inhaler Inhale 2 puffs into the lungs every 4 (four) hours as needed. For wheeze or shortness of breath 1 Inhaler 6  . alendronate (FOSAMAX) 70 MG tablet Take 1 tablet (70 mg total) by mouth every 7 (seven) days. Take with a full glass of water on an empty stomach. 12 tablet 3  . aspirin 81 MG tablet Take 81 mg by mouth daily.      . budesonide-formoterol (SYMBICORT) 160-4.5 MCG/ACT inhaler Inhale 2 puffs into the lungs 2 (two) times daily. 1 Inhaler 0  . furosemide (LASIX) 40 MG tablet Take 2 tablets (80 mg total) by mouth daily. Needs to establish with new PCP 90 tablet 3  . gabapentin (NEURONTIN) 300 MG capsule Take 1 capsule (300 mg total) by mouth 3 (three) times daily. 90 capsule 3  . meloxicam (MOBIC) 15 MG tablet Take 1 tablet (15 mg total) by mouth daily as needed for pain. Follow-up appt due in June must see provider for future refills 30 tablet 2  . Multiple Vitamin (MULTIVITAMIN) capsule Take 1 capsule by mouth daily.      Marland Kitchen omeprazole (PRILOSEC) 40 MG capsule  TAKE 1 CAPSULE BY MOUTH EVERY DAY 90 capsule 3  . OVER THE COUNTER MEDICATION     . potassium chloride SA (KLOR-CON M20) 20 MEQ tablet Take 1 tablet (20 mEq total) by mouth daily. 90 tablet 3  . pravastatin (PRAVACHOL) 40 MG tablet Take 1 tablet (40 mg total) by mouth daily. 90 tablet 3  . traZODone (DESYREL) 100 MG tablet Take 1 tablet (100 mg total) by mouth at bedtime. 90 tablet 1   No current facility-administered medications on file prior to visit.    Review of Systems  Constitutional: Negative for other unusual diaphoresis or sweats HENT: Negative for ear discharge or swelling Eyes: Negative for other worsening visual disturbances Respiratory: Negative for stridor or  other swelling  Gastrointestinal: Negative for worsening distension or other blood Genitourinary: Negative for retention or other urinary change Musculoskeletal: Negative for other MSK pain or swelling Skin: Negative for color change or other new lesions Neurological: Negative for worsening tremors and other numbness  Psychiatric/Behavioral: Negative for worsening agitation or other fatigue All other system neg per pt    Objective:   Physical Exam BP 122/82   Pulse 65   Temp 98.3 F (36.8 C) (Oral)   Ht 5\' 5"  (1.651 m)   Wt 214 lb (97.1 kg)   SpO2 96%   BMI 35.61 kg/m  VS noted,  Constitutional: Pt appears in NAD HENT: Head: NCAT.  Right Ear: External ear normal.  Left Ear: External ear normal.  Eyes: . Pupils are equal, round, and reactive to light. Conjunctivae and EOM are normal Nose: without d/c or deformity Neck: Neck supple. Gross normal ROM Cardiovascular: Normal rate and regular rhythm.   Pulmonary/Chest: Effort normal and breath sounds without rales or wheezing.  Abd:  Soft, ND, + BS, no organomegaly, mod diffuse tender worse LUQ and LLQ, no guarding or rebound Neurological: Pt is alert. At baseline orientation, motor grossly intact Skin: Skin is warm. No rashes, other new lesions, no LE edema Psychiatric: Pt behavior is normal without agitation , 1-2+ nervous, not depressed affect No other exam findings  Lab Results  Component Value Date   WBC 3.3 (L) 11/16/2017   HGB 11.6 (L) 11/16/2017   HCT 36.5 11/16/2017   PLT 195.0 11/16/2017   GLUCOSE 97 11/16/2017   CHOL 127 11/16/2017   TRIG 152.0 (H) 11/16/2017   HDL 51.90 11/16/2017   LDLCALC 45 11/16/2017   ALT 11 11/16/2017   AST 15 11/16/2017   NA 141 11/16/2017   K 4.3 11/16/2017   CL 106 11/16/2017   CREATININE 0.64 11/16/2017   BUN 10 11/16/2017   CO2 29 11/16/2017   TSH 1.31 11/16/2017   INR 1.14 06/16/2011       Assessment & Plan:

## 2018-03-03 ENCOUNTER — Telehealth: Payer: Self-pay

## 2018-03-03 NOTE — Telephone Encounter (Signed)
Pt has been informed and expressed understanding.  

## 2018-03-03 NOTE — Telephone Encounter (Signed)
-----   Message from Biagio Borg, MD sent at 03/03/2018 12:36 PM EDT ----- It it is both  OK to let pt know that any sexual partner(s) need to see their MD for tx as well before any further activity

## 2018-03-05 ENCOUNTER — Encounter: Payer: Self-pay | Admitting: Podiatry

## 2018-03-05 ENCOUNTER — Ambulatory Visit: Payer: BC Managed Care – PPO | Admitting: Podiatry

## 2018-03-05 DIAGNOSIS — M79675 Pain in left toe(s): Secondary | ICD-10-CM | POA: Diagnosis not present

## 2018-03-05 DIAGNOSIS — Z89422 Acquired absence of other left toe(s): Secondary | ICD-10-CM

## 2018-03-05 DIAGNOSIS — B351 Tinea unguium: Secondary | ICD-10-CM

## 2018-03-05 DIAGNOSIS — M79674 Pain in right toe(s): Secondary | ICD-10-CM

## 2018-03-05 NOTE — Progress Notes (Signed)
Complaint:  Visit Type: Patient returns to my office for continued preventative foot care services. Complaint: Patient states" my nails have grown long and thick and become painful to walk and wear shoes" Patient has been developing a painful big toe left foot.  Amputation second toe left foot. The patient presents for preventative foot care services. No changes to ROS.  Patient has not been seen for 8 months.  Podiatric Exam: Vascular: dorsalis pedis and posterior tibial pulses are palpable bilateral. Capillary return is immediate. Temperature gradient is WNL. Skin turgor WNL  Sensorium: Normal Semmes Weinstein monofilament test. Normal tactile sensation bilaterally. Nail Exam: Pt has thick disfigured discolored nails with subungual debris noted bilateral entire nail hallux through fifth toenails Ulcer Exam: There is no evidence of ulcer or pre-ulcerative changes or infection. Orthopedic Exam: Muscle tone and strength are WNL. No limitations in general ROM. No crepitus or effusions noted. 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 Skin: No Porokeratosis. No infection or ulcers  Diagnosis:  Onychomycosis, , Pain in right toe, pain in left toes  Treatment & Plan Procedures and Treatment: Consent by patient was obtained for treatment procedures.   Debridement of mycotic and hypertrophic toenails, 1 through 5 bilateral and clearing of subungual debris. No ulceration, no infection noted.  Return Visit-Office Procedure: Patient instructed to return to the office for a follow up visit 3 months for continued evaluation and treatment.    Laiyla Slagel DPM 

## 2018-03-07 NOTE — Assessment & Plan Note (Signed)
Mild to mod, cant r/o colitisi, declines CT, for antibx course,  to f/u any worsening symptoms or concerns

## 2018-03-07 NOTE — Assessment & Plan Note (Signed)
With mild situational worsening, d/w pt, declines change of tx or referral counseling

## 2018-03-07 NOTE — Assessment & Plan Note (Signed)
Also for urine studies and labs as ordered,  to f/u any worsening symptoms or concerns

## 2018-03-12 ENCOUNTER — Telehealth: Payer: Self-pay | Admitting: Internal Medicine

## 2018-03-12 DIAGNOSIS — R197 Diarrhea, unspecified: Secondary | ICD-10-CM

## 2018-03-12 MED ORDER — DIPHENOXYLATE-ATROPINE 2.5-0.025 MG PO TABS: 1.0000 | ORAL_TABLET | Freq: Four times a day (QID) | ORAL | 0 refills | Status: DC | PRN

## 2018-03-12 NOTE — Telephone Encounter (Signed)
Ok for Lomotil rx - done erx  Also referred to GI, hopefully to hear soon, thanks

## 2018-03-12 NOTE — Addendum Note (Signed)
Addended by: Biagio Borg on: 03/12/2018 11:34 AM   Modules accepted: Orders

## 2018-03-12 NOTE — Telephone Encounter (Signed)
Please advise 

## 2018-03-12 NOTE — Telephone Encounter (Signed)
Pt has been informed.

## 2018-03-12 NOTE — Telephone Encounter (Signed)
Patient is calling to report that her diarrhea is no better. She has finished the antibiotics- but she is still having diarrhea. She has been using imodium and kaopectate( both daily) and she still reports 4-5 loose stools daily. Patient is having loose to watery stools. Patient is drinking water to replace her fluids. Patient contact # [phone] or [phone].

## 2018-03-17 ENCOUNTER — Other Ambulatory Visit (INDEPENDENT_AMBULATORY_CARE_PROVIDER_SITE_OTHER): Payer: BC Managed Care – PPO

## 2018-03-17 ENCOUNTER — Ambulatory Visit: Payer: BC Managed Care – PPO | Admitting: Internal Medicine

## 2018-03-17 ENCOUNTER — Encounter: Payer: Self-pay | Admitting: Internal Medicine

## 2018-03-17 VITALS — BP 124/86 | HR 52 | Temp 97.7°F | Ht 65.0 in | Wt 227.0 lb

## 2018-03-17 DIAGNOSIS — R600 Localized edema: Secondary | ICD-10-CM

## 2018-03-17 DIAGNOSIS — J449 Chronic obstructive pulmonary disease, unspecified: Secondary | ICD-10-CM

## 2018-03-17 DIAGNOSIS — I10 Essential (primary) hypertension: Secondary | ICD-10-CM | POA: Diagnosis not present

## 2018-03-17 LAB — BASIC METABOLIC PANEL
BUN: 11 mg/dL (ref 6–23)
CO2: 31 mEq/L (ref 19–32)
Calcium: 8.7 mg/dL (ref 8.4–10.5)
Chloride: 105 mEq/L (ref 96–112)
Creatinine, Ser: 0.63 mg/dL (ref 0.40–1.20)
GFR: 118.42 mL/min (ref >60.00)
Glucose, Bld: 93 mg/dL (ref 70–99)
Potassium: 4.4 mEq/L (ref 3.5–5.1)
Sodium: 141 mEq/L (ref 135–145)

## 2018-03-17 LAB — CBC WITH DIFFERENTIAL/PLATELET
Basophils Absolute: 0 10*3/uL (ref 0.0–0.1)
Basophils Relative: 0.6 % (ref 0.0–3.0)
Eosinophils Absolute: 0.1 10*3/uL (ref 0.0–0.7)
Eosinophils Relative: 2.3 % (ref 0.0–5.0)
HCT: 36 % (ref 36.0–46.0)
Hemoglobin: 11.7 g/dL — ABNORMAL LOW (ref 12.0–15.0)
Lymphocytes Relative: 48 % — ABNORMAL HIGH (ref 12.0–46.0)
Lymphs Abs: 1.7 10*3/uL (ref 0.7–4.0)
MCHC: 32.5 g/dL (ref 30.0–36.0)
MCV: 83.6 fl (ref 78.0–100.0)
Monocytes Absolute: 0.3 10*3/uL (ref 0.1–1.0)
Monocytes Relative: 8.5 % (ref 3.0–12.0)
Neutro Abs: 1.4 10*3/uL (ref 1.4–7.7)
Neutrophils Relative %: 40.6 % — ABNORMAL LOW (ref 43.0–77.0)
Platelets: 191 10*3/uL (ref 150.0–400.0)
RBC: 4.3 Mil/uL (ref 3.87–5.11)
RDW: 14.1 % (ref 11.5–15.5)
WBC: 3.5 10*3/uL — ABNORMAL LOW (ref 4.0–10.5)

## 2018-03-17 LAB — BRAIN NATRIURETIC PEPTIDE: Pro B Natriuretic peptide (BNP): 100 pg/mL (ref 0.0–100.0)

## 2018-03-17 NOTE — Progress Notes (Signed)
Subjective:    Patient ID: [name], female    DOB: 1941-01-06, 77 y.o.   MRN: [id number]  HPI    Here with c/o "body swelling" with swelling to legs feet neck with tops of feet painful and some ST as well, but no worsening sob beyond what she attributes to asthma.  Concerned about wt gain  Has hx of mild pulm HTN per echo 2010 with normal EF, and grade 1 diast dysfxn.  No hx of CHF.  Pt denies chest pain, increased sob or doe, wheezing, orthopnea, PND, palpitations, dizziness or syncope.  Overall good compliance with treatment, and good medicine tolerability. Wt Readings from Last 3 Encounters:  03/17/18 227 lb (103 kg)  03/02/18 214 lb (97.1 kg)  11/14/17 222 lb (100.7 kg)  Diarrhea some improved finally yesterday with lomotil tx  Denies worsening reflux, abd pain, dysphagia, n/v, other bowel change or blood.  Has appt may 21 with GI.  Has been instructed per cardiology to take lasix qod until diarrhea improved.   Pt denies fever, wt loss, night sweats, loss of appetite, or other constitutional symptoms Past Medical History:  Diagnosis Date  . Allergic rhinitis   . Arnold-Chiari malformation ([place])   . Asthma   . CAD (coronary artery disease)   . COPD (chronic obstructive pulmonary disease) ([place])   . DJD (degenerative joint disease)   . GERD (gastroesophageal reflux disease)   . History of absence seizures   . Hx of colonic polyps   . Hypercholesteremia   . Hypertension   . Obesity   . Positive PPD   . Reflex sympathetic dystrophy   . Varicose veins   . Venous insufficiency    Past Surgical History:  Procedure Laterality Date  . ABDOMINAL HYSTERECTOMY    . cspine surgery  1993   for arnold-chiari malformation  . JOINT REPLACEMENT  06/05/11   Left total knee replacement  . right knee arthroscopy  12/2007   Dr. Tonita Cong  . right total knee replacement  05/2008   Dr. Tonita Cong    reports that she quit smoking about 6 years ago. Her smoking use included cigarettes. She has a 3.60  pack-year smoking history. She has never used smokeless tobacco. She reports that she does not drink alcohol or use drugs. family history includes Clotting disorder in her father; Heart disease in her mother; Stroke in her father and mother. No Known Allergies Current Outpatient Medications on File Prior to Visit  Medication Sig Dispense Refill  . albuterol (PROVENTIL HFA;VENTOLIN HFA) 108 (90 BASE) MCG/ACT inhaler Inhale 2 puffs into the lungs every 4 (four) hours as needed. For wheeze or shortness of breath 1 Inhaler 6  . alendronate (FOSAMAX) 70 MG tablet Take 1 tablet (70 mg total) by mouth every 7 (seven) days. Take with a full glass of water on an empty stomach. 12 tablet 3  . aspirin 81 MG tablet Take 81 mg by mouth daily.      . budesonide-formoterol (SYMBICORT) 160-4.5 MCG/ACT inhaler Inhale 2 puffs into the lungs 2 (two) times daily. 1 Inhaler 0  . diphenoxylate-atropine (LOMOTIL) 2.5-0.025 MG tablet Take 1 tablet by mouth 4 (four) times daily as needed for diarrhea or loose stools. 30 tablet 0  . furosemide (LASIX) 40 MG tablet Take 2 tablets (80 mg total) by mouth daily. Needs to establish with new PCP 90 tablet 3  . meloxicam (MOBIC) 15 MG tablet Take 1 tablet (15 mg total) by mouth daily as needed  for pain. Follow-up appt due in June must see provider for future refills 30 tablet 2  . Multiple Vitamin (MULTIVITAMIN) capsule Take 1 capsule by mouth daily.      Marland Kitchen omeprazole (PRILOSEC) 40 MG capsule TAKE 1 CAPSULE BY MOUTH EVERY DAY 90 capsule 3  . OVER THE COUNTER MEDICATION     . potassium chloride SA (KLOR-CON M20) 20 MEQ tablet Take 1 tablet (20 mEq total) by mouth daily. 90 tablet 3  . pravastatin (PRAVACHOL) 40 MG tablet Take 1 tablet (40 mg total) by mouth daily. 90 tablet 3  . traZODone (DESYREL) 100 MG tablet Take 1 tablet (100 mg total) by mouth at bedtime. 90 tablet 1   No current facility-administered medications on file prior to visit.    Review of Systems   Constitutional: Negative for other unusual diaphoresis or sweats HENT: Negative for ear discharge or swelling Eyes: Negative for other worsening visual disturbances Respiratory: Negative for stridor or other swelling  Gastrointestinal: Negative for worsening distension or other blood Genitourinary: Negative for retention or other urinary change Musculoskeletal: Negative for other MSK pain or swelling Skin: Negative for color change or other new lesions Neurological: Negative for worsening tremors and other numbness  Psychiatric/Behavioral: Negative for worsening agitation or other fatigue All other system neg per pt    Objective:   Physical Exam BP 124/86   Pulse (!) 52   Temp 97.7 F (36.5 C) (Oral)   Ht 5\' 5"  (1.651 m)   Wt 227 lb (103 kg)   SpO2 97%   BMI 37.77 kg/m  VS noted,  Constitutional: Pt appears in NAD HENT: Head: NCAT.  Right Ear: External ear normal.  Left Ear: External ear normal.  Eyes: . Pupils are equal, round, and reactive to light. Conjunctivae and EOM are normal Nose: without d/c or deformity Neck: Neck supple. Gross normal ROM Cardiovascular: Normal rate and regular rhythm.   Pulmonary/Chest: Effort normal and breath sounds without rales or wheezing.  Abd:  Soft, mild epigastric tender, ND, + BS, no organomegaly Neurological: Pt is alert. At baseline orientation, motor grossly intact Skin: Skin is warm. No rashes, other new lesions, 1+ bilat LE edema to knees Psychiatric: Pt behavior is normal without agitation  No other exam findings    Assessment & Plan:

## 2018-03-17 NOTE — Assessment & Plan Note (Addendum)
Worsened with wt gain though no sob, ? pulm HTN worsening vs diast chf - for f/u echo, check BNP, CBC, BMP; ok to take the lasix 80 qod, consider f/u with cardiology

## 2018-03-17 NOTE — Patient Instructions (Addendum)
OK to take the lasix fluid pill twice per day as needed to keep the swelling from worsening but not until the diarrhea resolved  Please continue all other medications as before, and refills have been done if requested.  Please have the pharmacy call with any other refills you may need.  Please continue your efforts at being more active, low cholesterol diet, and weight control.  Please keep your appointments with your specialists as you may have planned  You will be contacted regarding the referral for: Echocardiogram  Please go to the LAB in the Basement (turn left off the elevator) for the tests to be done today  You will be contacted by phone if any changes need to be made immediately.  Otherwise, you will receive a letter about your results with an explanation, but please check with MyChart first.  Please remember to sign up for MyChart if you have not done so, as this will be important to you in the future with finding out test results, communicating by private email, and scheduling acute appointments online when needed.

## 2018-03-18 ENCOUNTER — Telehealth: Payer: Self-pay | Admitting: Cardiology

## 2018-03-18 NOTE — Telephone Encounter (Signed)
New message   Pt c/o swelling: STAT is pt has developed SOB within 24 hours  1) How much weight have you gained and in what time span? 5LBS since 4/13  2) If swelling, where is the swelling located? FEET, LEGS  3) Are you currently taking a fluid pill? YES  4) Are you currently SOB? NO  5) Do you have a log of your daily weights (if so, list)? 223 today, 214lbs on 4/13  6) Have you gained 3 pounds in a day or 5 pounds in a week? NO  7) Have you traveled recently? NO

## 2018-03-18 NOTE — Telephone Encounter (Signed)
Reviewed chart, patient was seen by PCP yesterday for swelling in her legs. She was advised to take her Furosemide twice a day as needed for swelling, labs done BNP 100, and recommended getting Echo. Will send message to scheduling to get Echo  and follow up with PA/Dr [name], first available. Do as PCP recommended at visit.  Left message to call back

## 2018-03-19 NOTE — Assessment & Plan Note (Signed)
stable overall by history and exam, recent data reviewed with pt, and pt to continue medical treatment as before,  to f/u any worsening symptoms or concerns BP Readings from Last 3 Encounters:  03/17/18 124/86  03/02/18 122/82  11/14/17 118/60

## 2018-03-19 NOTE — Telephone Encounter (Signed)
Echo and follow up appointment has been scheduled.

## 2018-03-19 NOTE — Assessment & Plan Note (Signed)
stable overall by history and exam, and pt to continue medical treatment as before,  to f/u any worsening symptoms or concerns 

## 2018-03-26 ENCOUNTER — Encounter: Payer: Self-pay | Admitting: Internal Medicine

## 2018-03-26 ENCOUNTER — Ambulatory Visit (HOSPITAL_COMMUNITY): Payer: BC Managed Care – PPO | Attending: Cardiovascular Disease

## 2018-03-26 ENCOUNTER — Other Ambulatory Visit: Payer: Self-pay

## 2018-03-26 DIAGNOSIS — E7800 Pure hypercholesterolemia, unspecified: Secondary | ICD-10-CM | POA: Insufficient documentation

## 2018-03-26 DIAGNOSIS — I5031 Acute diastolic (congestive) heart failure: Secondary | ICD-10-CM | POA: Insufficient documentation

## 2018-03-26 DIAGNOSIS — M7989 Other specified soft tissue disorders: Secondary | ICD-10-CM | POA: Diagnosis present

## 2018-03-26 DIAGNOSIS — I2510 Atherosclerotic heart disease of native coronary artery without angina pectoris: Secondary | ICD-10-CM | POA: Insufficient documentation

## 2018-03-26 DIAGNOSIS — J449 Chronic obstructive pulmonary disease, unspecified: Secondary | ICD-10-CM | POA: Insufficient documentation

## 2018-03-26 DIAGNOSIS — I110 Hypertensive heart disease with heart failure: Secondary | ICD-10-CM | POA: Insufficient documentation

## 2018-03-26 DIAGNOSIS — I071 Rheumatic tricuspid insufficiency: Secondary | ICD-10-CM | POA: Insufficient documentation

## 2018-03-26 DIAGNOSIS — R600 Localized edema: Secondary | ICD-10-CM

## 2018-03-30 NOTE — Progress Notes (Signed)
Cardiology Office Note   Date:  03/31/2018   ID:  [name], DOB 05-10-1941, MRN [id number]  PCP:  Biagio Borg, MD  Cardiologist: [facility]   Chief Complaint  Patient presents with  . Follow-up  . Edema  . Coronary Artery Disease     History of Present Illness: [name] is a 77 y.o. female who presents for ongoing assessment and management of coronary artery disease.  The patient had a catheterization in February 2005 which revealed mild irregularities of his left main, with 40% stenosis in the proximal LAD and distal 30% lesion.  The first diagonal had a 30% to 40% proximal lesion.  The ostium of the LAD by intravascular ultrasound revealed luminal area of 3.9 mm with 60% stenosis.  The patient was also found to have nonobstructive disease of the left circumflex and right coronary artery with normal LV systolic function.  A follow-up nuclear medicine study revealed an EF of 50% with normal perfusion.  Patient was last seen by Dr. Stanford [name] on 12/27/2015 at which time she was without complaint.  Patient called our office on 03/18/2018 with complaints of shortness of breath and lower extremity edema of her feet and legs.  She was seen by PCP and was advised to increase her Lasix to 80 mg twice daily which she did for 5 days.  The patient lost approximately 12 pounds of fluid.  Follow-up echocardiogram was completed on 03/26/2018 which revealed normal LV systolic function with grade 1 diastolic function.  The patient admits to eating out too fast food a lot can take you fried chicken [employer], and some of the local cafeterias as she is single and does not cook at home.  The patient notices that her legs are more edematous at the end of the day compared to when she first awakens at night.    The patient did have venous ultrasound completed and was negative for DVT or venous insufficiency during evaluation on 05/07/2017.  She continues to complain of lower extremity pain intermittent  claudication symptoms especially on the lower left calf that goes away when she rests.  She has some chronic left knee pain which she is uncertain if it is related to her not.  The patient is now gone back to normal doses of Lasix 80 mg in the morning.  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  Blood pressure slightly elevated today that she has not taken Lasix due to this appointment.  Past Medical History:  Diagnosis Date  . Allergic rhinitis   . Arnold-Chiari malformation ([place])   . Asthma   . CAD (coronary artery disease)   . COPD (chronic obstructive pulmonary disease) ([place])   . DJD (degenerative joint disease)   . GERD (gastroesophageal reflux disease)   . History of absence seizures   . Hx of colonic polyps   . Hypercholesteremia   . Hypertension   . Obesity   . Positive PPD   . Reflex sympathetic dystrophy   . Varicose veins   . Venous insufficiency     Past Surgical History:  Procedure Laterality Date  . ABDOMINAL HYSTERECTOMY    . cspine surgery  1993   for arnold-chiari malformation  . JOINT REPLACEMENT  06/05/11   Left total knee replacement  . right knee arthroscopy  12/2007   Dr. Tonita Cong  . right total knee replacement  05/2008   Dr.  Beane     Current Outpatient Medications  Medication Sig Dispense Refill  . albuterol (PROVENTIL HFA;VENTOLIN HFA) 108 (90 BASE) MCG/ACT inhaler Inhale 2 puffs into the lungs every 4 (four) hours as needed. For wheeze or shortness of breath 1 Inhaler 6  . alendronate (FOSAMAX) 70 MG tablet Take 1 tablet (70 mg total) by mouth every 7 (seven) days. Take with a full glass of water on an empty stomach. 12 tablet 3  . aspirin 81 MG tablet Take 81 mg by mouth daily.      . budesonide-formoterol (SYMBICORT) 160-4.5 MCG/ACT inhaler Inhale 2 puffs into the lungs 2 (two) times daily. 1 Inhaler 0  . diphenoxylate-atropine (LOMOTIL) 2.5-0.025 MG tablet Take 1 tablet by  mouth 4 (four) times daily as needed for diarrhea or loose stools. 30 tablet 0  . furosemide (LASIX) 40 MG tablet Take 2 tablets (80 mg total) by mouth daily. Needs to establish with new PCP 90 tablet 3  . meloxicam (MOBIC) 15 MG tablet Take 1 tablet (15 mg total) by mouth daily as needed for pain. Follow-up appt due in June must see provider for future refills 30 tablet 2  . Multiple Vitamin (MULTIVITAMIN) capsule Take 1 capsule by mouth daily.      Marland Kitchen omeprazole (PRILOSEC) 40 MG capsule TAKE 1 CAPSULE BY MOUTH EVERY DAY 90 capsule 3  . OVER THE COUNTER MEDICATION     . potassium chloride SA (KLOR-CON M20) 20 MEQ tablet Take 1 tablet (20 mEq total) by mouth daily. 90 tablet 3  . pravastatin (PRAVACHOL) 40 MG tablet Take 1 tablet (40 mg total) by mouth daily. 90 tablet 3  . traZODone (DESYREL) 100 MG tablet Take 1 tablet (100 mg total) by mouth at bedtime. 90 tablet 1   No current facility-administered medications for this visit.     Allergies:   Patient has no known allergies.    Social History:  The patient  reports that she quit smoking about 6 years ago. Her smoking use included cigarettes. She has a 3.60 pack-year smoking history. She has never used smokeless tobacco. She reports that she does not drink alcohol or use drugs.   Family History:  The patient's family history includes Clotting disorder in her father; Heart disease in her mother; Stroke in her father and mother.    ROS: All other systems are reviewed and negative. Unless otherwise mentioned in H&P    PHYSICAL EXAM: VS:  BP (!) 141/88   Pulse (!) 54   Ht 5\' 5"  (1.651 m)   Wt 215 lb 12.8 oz (97.9 kg)   BMI 35.91 kg/m  , BMI Body mass index is 35.91 kg/m. GEN: Well nourished, well developed, in no acute distress  HEENT: normal  Neck: no JVD, carotid bruits, or masses Cardiac: RRR; no murmurs, rubs, or gallops,1+-2+ dependent edema  Respiratory:  Clear to auscultation bilaterally, normal work of breathing GI: soft,  nontender, nondistended, + BS MS: no deformity or atrophy  Skin: warm and dry, no rash Neuro:  Strength and sensation are intact Psych: euthymic mood, full affect   EKG: Not completed during this office visit today.  Recent Labs: 11/16/2017: TSH 1.31 03/02/2018: ALT 14 03/17/2018: BUN 11; Creatinine, Ser 0.63; Hemoglobin 11.7; Platelets 191.0; Potassium 4.4; Pro B Natriuretic peptide (BNP) 100.0; Sodium 141    Lipid Panel    Component Value Date/Time   CHOL 127 11/16/2017 0931   TRIG 152.0 (H) 11/16/2017 0931   HDL 51.90 11/16/2017 0931  CHOLHDL 2 11/16/2017 0931   VLDL 30.4 11/16/2017 0931   LDLCALC 45 11/16/2017 0931      Wt Readings from Last 3 Encounters:  03/31/18 215 lb 12.8 oz (97.9 kg)  03/17/18 227 lb (103 kg)  03/02/18 214 lb (97.1 kg)      Other studies Reviewed:  Echocardiogram 03/26/2018  Left ventricle: The cavity size was normal. Wall thickness was   increased in a pattern of mild LVH. Systolic function was normal.   The estimated ejection fraction was in the range of 55% to 60%.   Wall motion was normal; there were no regional wall motion   abnormalities. Doppler parameters are consistent with abnormal   left ventricular relaxation (grade 1 diastolic dysfunction). - Aortic valve: Mildly calcified annulus. Trileaflet. - Mitral valve: Mildly thickened leaflets . - Right ventricle: The cavity size was mildly dilated. Wall   thickness was normal. Systolic function was low normal. - Right atrium: The atrium was mildly dilated. - Tricuspid valve: There was mild regurgitation. - Pulmonary arteries: PA peak pressure: 36 mm Hg (S).  ASSESSMENT AND PLAN:  1.  Chronic lower extremity edema: Patient has severe lower extremity edema last week and was seen by her primary care physician who increased her dose of 80 mg Lasix to twice daily and sent him daily.  Patient had good response to this with a weight loss dropping from 227 pounds to 215 pounds.  The patient  denies noncompliance, skipping doses of diuretics unless she has stopped his appointments but takes it later in the day. I will repeat  BMET to evaluate kidney function after higher doses of Lasix and significant diuresis.  She is advised on low-sodium diet and to avoid fast food.  Echocardiogram is reassuring concerning valvular function and diastolic function.  2.  Suspicion of intermittent claudication: The patient does complain of pain in her calves when walking goes away with rest, worse on the left.  I am able to palpate dorsalis pedis pulses on the right but not on the left.  She continues to have some mild lower extremity edema in the dependent position.  She also complains of some left knee pain related to arthritis and is uncertain if the pain that she is experienced in the lower calf area is related.  I am going to order ABIs with Doppler studies to have better evaluation of her vascular status concerning the symptoms.  3.  History of coronary artery disease: Most recent cardiac catheterization in 2005 revealed multivessel CAD without obstructive disease.  The patient had a follow-up nuclear medicine study in 2016 which was low risk.  The patient will continue secondary prevention with statin therapy and aspirin 81 mg daily.  She is asymptomatic concerning chest discomfort or dyspnea.  4.  Hypercholesterolemia: Continue statin therapy.  Current medicines are reviewed at length with the patient today.    Labs/ tests ordered today include: ABI's and BMET  Phill Myron. West Pugh, ANP, AACC   03/31/2018 11:26 AM    [facility] [address], [place], [place] [address] Phone: [phone]; Fax: [phone]

## 2018-03-31 ENCOUNTER — Ambulatory Visit: Payer: BC Managed Care – PPO | Admitting: Adult Health

## 2018-03-31 ENCOUNTER — Encounter: Payer: Self-pay | Admitting: Adult Health

## 2018-03-31 VITALS — BP 141/88 | HR 54 | Ht 65.0 in | Wt 215.8 lb

## 2018-03-31 DIAGNOSIS — I739 Peripheral vascular disease, unspecified: Secondary | ICD-10-CM

## 2018-03-31 DIAGNOSIS — I2510 Atherosclerotic heart disease of native coronary artery without angina pectoris: Secondary | ICD-10-CM

## 2018-03-31 DIAGNOSIS — I5032 Chronic diastolic (congestive) heart failure: Secondary | ICD-10-CM | POA: Diagnosis not present

## 2018-03-31 DIAGNOSIS — Z79899 Other long term (current) drug therapy: Secondary | ICD-10-CM | POA: Diagnosis not present

## 2018-03-31 DIAGNOSIS — R609 Edema, unspecified: Secondary | ICD-10-CM

## 2018-03-31 NOTE — Patient Instructions (Signed)
Medication Instructions:  NO CHANGES- Your physician recommends that you continue on your current medications as directed. Please refer to the Current Medication list given to you today.  If you need a refill on your cardiac medications before your next appointment, please call your pharmacy.  Labwork: BMET TODAY HERE IN OUR OFFICE AT [facility]  Take the provided lab slips with you to the lab for your blood draw.   Testing/Procedures: Your physician has requested that you have an ankle brachial index (ABI). During this test an ultrasound and blood pressure cuff are used to evaluate the arteries that supply the arms and legs with blood. Allow thirty minutes for this exam. There are no restrictions or special instructions.  Special Instructions: OK TO TAKE EXTRA LASIX FOR SWELLING  Follow-Up: Your physician wants you to follow-up in: July [place].  Thank you for choosing [facility]!!

## 2018-04-01 LAB — BASIC METABOLIC PANEL
BUN/Creatinine Ratio: 18 (ref 12–28)
BUN: 11 mg/dL (ref 8–27)
CO2: 27 mmol/L (ref 20–29)
Calcium: 9.1 mg/dL (ref 8.7–10.3)
Chloride: 105 mmol/L (ref 96–106)
Creatinine, Ser: 0.62 mg/dL (ref 0.57–1.00)
GFR calc Af Amer: 102 mL/min/{1.73_m2} (ref >59)
GFR calc non Af Amer: 88 mL/min/{1.73_m2} (ref >59)
Glucose: 85 mg/dL (ref 65–99)
Potassium: 4.7 mmol/L (ref 3.5–5.2)
Sodium: 143 mmol/L (ref 134–144)

## 2018-04-16 ENCOUNTER — Ambulatory Visit (HOSPITAL_COMMUNITY)
Admission: RE | Admit: 2018-04-16 | Discharge: 2018-04-16 | Disposition: A | Discharge status: Home / Self Care | Payer: BC Managed Care – PPO | Source: Ambulatory Visit | Attending: Cardiovascular Disease | Admitting: Cardiovascular Disease

## 2018-04-16 DIAGNOSIS — I739 Peripheral vascular disease, unspecified: Secondary | ICD-10-CM | POA: Diagnosis present

## 2018-04-20 ENCOUNTER — Ambulatory Visit: Payer: BC Managed Care – PPO | Admitting: Gastroenterology

## 2018-04-20 ENCOUNTER — Encounter: Payer: Self-pay | Admitting: Gastroenterology

## 2018-04-20 VITALS — BP 110/70 | HR 76 | Ht 64.0 in | Wt 213.5 lb

## 2018-04-20 DIAGNOSIS — R197 Diarrhea, unspecified: Secondary | ICD-10-CM

## 2018-04-20 DIAGNOSIS — K219 Gastro-esophageal reflux disease without esophagitis: Secondary | ICD-10-CM | POA: Diagnosis not present

## 2018-04-20 NOTE — Progress Notes (Signed)
History of Present Illness: This is a 78 year old female referred by Biagio Borg, MD for the evaluation of diarrhea.  In late March she developed the sudden onset of loose, watery, nonbloody diarrhea associated with lower abdominal cramping.  She was evaluated by Dr. Jenny Reichmann.  GI pathogen panel was ordered but was not completed.  Patient states she was having 6-8 bowel movements per day.  She began taking Lomotil as needed her diarrhea which resolved over the course of 2 weeks.  Her bowel habits have returned to normal.  Her abdominal pain resolved when the diarrhea resolved.  Colonoscopy performed in November 2014 showed 2 small hyperplastic polyps and it was otherwise normal. Denies weight loss, constipation, change in stool caliber, melena, hematochezia, nausea, vomiting, dysphagia, reflux symptoms, chest pain.   No Known Allergies Outpatient Medications Prior to Visit  Medication Sig Dispense Refill  . albuterol (PROVENTIL HFA;VENTOLIN HFA) 108 (90 BASE) MCG/ACT inhaler Inhale 2 puffs into the lungs every 4 (four) hours as needed. For wheeze or shortness of breath 1 Inhaler 6  . alendronate (FOSAMAX) 70 MG tablet Take 1 tablet (70 mg total) by mouth every 7 (seven) days. Take with a full glass of water on an empty stomach. 12 tablet 3  . aspirin 81 MG tablet Take 81 mg by mouth daily.      . budesonide-formoterol (SYMBICORT) 160-4.5 MCG/ACT inhaler Inhale 2 puffs into the lungs 2 (two) times daily. 1 Inhaler 0  . furosemide (LASIX) 40 MG tablet Take 2 tablets (80 mg total) by mouth daily. Needs to establish with new PCP 90 tablet 3  . meloxicam (MOBIC) 15 MG tablet Take 1 tablet (15 mg total) by mouth daily as needed for pain. Follow-up appt due in June must see provider for future refills 30 tablet 2  . Multiple Vitamin (MULTIVITAMIN) capsule Take 1 capsule by mouth daily.      Marland Kitchen omeprazole (PRILOSEC) 40 MG capsule TAKE 1 CAPSULE BY MOUTH EVERY DAY 90 capsule 3  . OVER THE COUNTER MEDICATION      . potassium chloride SA (KLOR-CON M20) 20 MEQ tablet Take 1 tablet (20 mEq total) by mouth daily. 90 tablet 3  . pravastatin (PRAVACHOL) 40 MG tablet Take 1 tablet (40 mg total) by mouth daily. 90 tablet 3  . traZODone (DESYREL) 100 MG tablet Take 1 tablet (100 mg total) by mouth at bedtime. 90 tablet 1  . Vitamin D, Ergocalciferol, (DRISDOL) 50000 units CAPS capsule Take 50,000 Units by mouth every 7 (seven) days.    . diphenoxylate-atropine (LOMOTIL) 2.5-0.025 MG tablet Take 1 tablet by mouth 4 (four) times daily as needed for diarrhea or loose stools. 30 tablet 0   No facility-administered medications prior to visit.    Past Medical History:  Diagnosis Date  . Allergic rhinitis   . Arnold-Chiari malformation ([place])   . Asthma   . CAD (coronary artery disease)   . COPD (chronic obstructive pulmonary disease) ([place])   . DJD (degenerative joint disease)   . GERD (gastroesophageal reflux disease)   . History of absence seizures   . Hx of colonic polyps   . Hypercholesteremia   . Hypertension   . Obesity   . Positive PPD   . Reflex sympathetic dystrophy   . Varicose veins   . Venous insufficiency    Past Surgical History:  Procedure Laterality Date  . ABDOMINAL HYSTERECTOMY    . cspine surgery  1993   for arnold-chiari malformation  . JOINT  REPLACEMENT  06/05/11   Left total knee replacement  . right knee arthroscopy  12/2007   Dr. Tonita Cong  . right total knee replacement  05/2008   Dr. Tonita Cong   Social History   Socioeconomic History  . Marital status: Widowed    Spouse name: Not on file  . Number of children: 2  . Years of education: 10  . Highest education level: Not on file  Occupational History  . Occupation: retired  Scientific laboratory technician  . Financial resource strain: Not on file  . Food insecurity:    Worry: Not on file    Inability: Not on file  . Transportation needs:    Medical: Not on file    Non-medical: Not on file  Tobacco Use  . Smoking status: Former Smoker     Packs/day: 0.30    Years: 12.00    Pack years: 3.60    Types: Cigarettes    Last attempt to quit: 01/02/2012    Years since quitting: 6.3  . Smokeless tobacco: Never Used  . Tobacco comment: 1 pack per week  Substance and Sexual Activity  . Alcohol use: No  . Drug use: No  . Sexual activity: Not on file  Lifestyle  . Physical activity:    Days per week: Not on file    Minutes per session: Not on file  . Stress: Not on file  Relationships  . Social connections:    Talks on phone: Not on file    Gets together: Not on file    Attends religious service: Not on file    Active member of club or organization: Not on file    Attends meetings of clubs or organizations: Not on file    Relationship status: Not on file  Other Topics Concern  . Not on file  Social History Narrative   Fun/Hobby: Playing cards and mingling.   Denies abuse and feels safe at home.    Family History  Problem Relation Age of Onset  . Heart disease Mother   . Stroke Mother   . Stroke Father   . Clotting disorder Father   . Colon cancer Neg Hx   . Stomach cancer Neg Hx        Review of Systems: Pertinent positive and negative review of systems were noted in the above HPI section. All other review of systems were otherwise negative.   Physical Exam: General: Well developed, well nourished, no acute distress Head: Normocephalic and atraumatic Eyes:  sclerae anicteric, EOMI Ears: Normal auditory acuity Mouth: No deformity or lesions Neck: Supple, no masses or thyromegaly Lungs: Clear throughout to auscultation Heart: Regular rate and rhythm; no murmurs, rubs or bruits Abdomen: Soft, non tender and non distended. No masses, hepatosplenomegaly or hernias noted. Normal Bowel sounds Rectal: Not done Musculoskeletal: Symmetrical with no gross deformities  Skin: No lesions on visible extremities Pulses:  Normal pulses noted Extremities: No clubbing, cyanosis, edema or deformities noted Neurological:  Alert oriented x 4, grossly nonfocal Cervical Nodes:  No significant cervical adenopathy Inguinal Nodes: No significant inguinal adenopathy Psychological:  Alert and cooperative. Normal mood and affect  Assessment and Recommendations:  1.  Self-limited diarrhea.  Likely an infectious diarrhea.  No further evaluation needed at this time.  If diarrhea returns will complete a GI pathogen panel and consider further evaluation.  2.  GERD.  Well-controlled on omeprazole 40 mg daily and standard antireflux measures.   cc: Biagio Borg, MD [place]  [place], [place] [address]

## 2018-04-20 NOTE — Patient Instructions (Signed)
Normal BMI (Body Mass Index- based on height and weight) is between 23 and 30. Your BMI today is Body mass index is 36.65 kg/m. Marland Kitchen Please consider follow up  regarding your BMI with your Primary Care Provider.  Thank you for choosing me and [facility].  Pricilla Riffle. Dagoberto Ligas., MD., Marval Regal

## 2018-05-05 ENCOUNTER — Other Ambulatory Visit: Payer: Self-pay | Admitting: Internal Medicine

## 2018-06-09 ENCOUNTER — Encounter: Payer: Self-pay | Admitting: Podiatry

## 2018-06-09 ENCOUNTER — Ambulatory Visit: Payer: BC Managed Care – PPO | Admitting: Podiatry

## 2018-06-09 DIAGNOSIS — M79674 Pain in right toe(s): Secondary | ICD-10-CM | POA: Diagnosis not present

## 2018-06-09 DIAGNOSIS — M79675 Pain in left toe(s): Secondary | ICD-10-CM | POA: Diagnosis not present

## 2018-06-09 DIAGNOSIS — B351 Tinea unguium: Secondary | ICD-10-CM

## 2018-06-09 DIAGNOSIS — I739 Peripheral vascular disease, unspecified: Secondary | ICD-10-CM

## 2018-06-09 DIAGNOSIS — Z89422 Acquired absence of other left toe(s): Secondary | ICD-10-CM

## 2018-06-09 NOTE — Progress Notes (Signed)
Complaint:  Visit Type: Patient returns to my office for continued preventative foot care services. Complaint: Patient states" my nails have grown long and thick and become painful to walk and wear shoes" Patient has been developing a painful big toe left foot.  Amputation second toe left foot. The patient presents for preventative foot care services. No changes to ROS.  Patient has not been seen for 8 months.  Podiatric Exam: Vascular: dorsalis pedis and posterior tibial pulses are palpable bilateral. Capillary return is immediate. Temperature gradient is WNL. Skin turgor WNL  Sensorium: Normal Semmes Weinstein monofilament test. Normal tactile sensation bilaterally. Nail Exam: Pt has thick disfigured discolored nails with subungual debris noted bilateral entire nail hallux through fifth toenails Ulcer Exam: There is no evidence of ulcer or pre-ulcerative changes or infection. Orthopedic Exam: Muscle tone and strength are WNL. No limitations in general ROM. No crepitus or effusions noted. 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 Skin: No Porokeratosis. No infection or ulcers  Diagnosis:  Onychomycosis, , Pain in right toe, pain in left toes  Treatment & Plan Procedures and Treatment: Consent by patient was obtained for treatment procedures.   Debridement of mycotic and hypertrophic toenails, 1 through 5 bilateral and clearing of subungual debris. No ulceration, no infection noted.  Return Visit-Office Procedure: Patient instructed to return to the office for a follow up visit 3 months for continued evaluation and treatment.    Gardiner Barefoot DPM

## 2018-06-21 LAB — HM MAMMOGRAPHY

## 2018-06-29 DIAGNOSIS — H524 Presbyopia: Secondary | ICD-10-CM | POA: Insufficient documentation

## 2018-06-29 DIAGNOSIS — H5203 Hypermetropia, bilateral: Secondary | ICD-10-CM | POA: Insufficient documentation

## 2018-07-06 NOTE — Progress Notes (Signed)
HPI: FU coronary disease. Cardiac catheterization 2/05 showed mild irregularities in the left main. There was a 40% stenosis in the proximal LAD and a distal 30% lesion. The first diagonal had a 30-40% proximal lesion. The ostium of the LAD by intravascular ultrasound had a luminal area of 3.9 mm squared and percent stenosis 60%. She had nonobstructive disease in the left circumflex and right coronary. Her LV function was normal. Nuclear study 1/16 showed EF 58 and normal perfusion.  Echocardiogram April 2019 showed normal LV function, mild left ventricular hypertrophy, grade 1 diastolic dysfunction, mild right atrial and right ventricular enlargement and mild tricuspid regurgitation.  Patient seen May 2019 with lower extremity edema.  Lasix had recently been increased.  ABIs May 2019 normal.  Since I last saw her, she denies dyspnea, chest pain, palpitations or syncope.  Pedal edema is controlled with present dose of Lasix.  Current Outpatient Medications  Medication Sig Dispense Refill  . albuterol (PROVENTIL HFA;VENTOLIN HFA) 108 (90 BASE) MCG/ACT inhaler Inhale 2 puffs into the lungs every 4 (four) hours as needed. For wheeze or shortness of breath 1 Inhaler 6  . alendronate (FOSAMAX) 70 MG tablet Take 1 tablet (70 mg total) by mouth every 7 (seven) days. Take with a full glass of water on an empty stomach. 12 tablet 3  . aspirin 81 MG tablet Take 81 mg by mouth daily.      . budesonide-formoterol (SYMBICORT) 160-4.5 MCG/ACT inhaler Inhale 2 puffs into the lungs 2 (two) times daily. 1 Inhaler 0  . furosemide (LASIX) 40 MG tablet Take 2 tablets (80 mg total) by mouth daily. Needs to establish with new PCP 90 tablet 3  . meloxicam (MOBIC) 15 MG tablet TAKE 1 TABLET (15 MG TOTAL) BY MOUTH DAILY AS NEEDED FOR PAIN 30 tablet 2  . Multiple Vitamin (MULTIVITAMIN) capsule Take 1 capsule by mouth daily.      Marland Kitchen omeprazole (PRILOSEC) 40 MG capsule TAKE 1 CAPSULE BY MOUTH EVERY DAY 90 capsule 3  . OVER  THE COUNTER MEDICATION     . potassium chloride SA (KLOR-CON M20) 20 MEQ tablet Take 1 tablet (20 mEq total) by mouth daily. 90 tablet 3  . pravastatin (PRAVACHOL) 40 MG tablet Take 1 tablet (40 mg total) by mouth daily. 90 tablet 3  . traZODone (DESYREL) 100 MG tablet Take 1 tablet (100 mg total) by mouth at bedtime. 90 tablet 1  . Vitamin D, Ergocalciferol, (DRISDOL) 50000 units CAPS capsule Take 50,000 Units by mouth every 7 (seven) days.     No current facility-administered medications for this visit.      Past Medical History:  Diagnosis Date  . Allergic rhinitis   . Arnold-Chiari malformation ([place])   . Asthma   . CAD (coronary artery disease)   . COPD (chronic obstructive pulmonary disease) ([place])   . DJD (degenerative joint disease)   . GERD (gastroesophageal reflux disease)   . History of absence seizures   . Hx of colonic polyps   . Hypercholesteremia   . Hypertension   . Obesity   . Positive PPD   . Reflex sympathetic dystrophy   . Varicose veins   . Venous insufficiency     Past Surgical History:  Procedure Laterality Date  . ABDOMINAL HYSTERECTOMY    . cspine surgery  1993   for arnold-chiari malformation  . JOINT REPLACEMENT  06/05/11   Left total knee replacement  . right knee arthroscopy  12/2007   Dr.  Beane  . right total knee replacement  05/2008   Dr. Tonita Cong    Social History   Socioeconomic History  . Marital status: Widowed    Spouse name: Not on file  . Number of children: 2  . Years of education: 25  . Highest education level: Not on file  Occupational History  . Occupation: retired  Scientific laboratory technician  . Financial resource strain: Not on file  . Food insecurity:    Worry: Not on file    Inability: Not on file  . Transportation needs:    Medical: Not on file    Non-medical: Not on file  Tobacco Use  . Smoking status: Former Smoker    Packs/day: 0.30    Years: 12.00    Pack years: 3.60    Types: Cigarettes    Last attempt to quit: 01/02/2012      Years since quitting: 6.5  . Smokeless tobacco: Never Used  . Tobacco comment: 1 pack per week  Substance and Sexual Activity  . Alcohol use: No  . Drug use: No  . Sexual activity: Not on file  Lifestyle  . Physical activity:    Days per week: Not on file    Minutes per session: Not on file  . Stress: Not on file  Relationships  . Social connections:    Talks on phone: Not on file    Gets together: Not on file    Attends religious service: Not on file    Active member of club or organization: Not on file    Attends meetings of clubs or organizations: Not on file    Relationship status: Not on file  . Intimate partner violence:    Fear of current or ex partner: Not on file    Emotionally abused: Not on file    Physically abused: Not on file    Forced sexual activity: Not on file  Other Topics Concern  . Not on file  Social History Narrative   Fun/Hobby: Playing cards and mingling.   Denies abuse and feels safe at home.     Family History  Problem Relation Age of Onset  . Heart disease Mother   . Stroke Mother   . Stroke Father   . Clotting disorder Father   . Colon cancer Neg Hx   . Stomach cancer Neg Hx     ROS: no fevers or chills, productive cough, hemoptysis, dysphasia, odynophagia, melena, hematochezia, dysuria, hematuria, rash, seizure activity, orthopnea, PND, pedal edema, claudication. Remaining systems are negative.  Physical Exam: Well-developed obese in no acute distress.  Skin is warm and dry.  HEENT is normal.  Neck is supple.  Chest is clear to auscultation with normal expansion.  Cardiovascular exam is regular rate and rhythm.  Abdominal exam nontender or distended. No masses palpated. Extremities show trace edema. neuro grossly intact  ECG-sinus bradycardia at a rate of 47, nonspecific ST changes.  Personally reviewed  A/P  1 coronary artery disease-patient doing well with no chest pain.  Continue medical therapy including aspirin and  statin.  2 hypertension-blood pressure is controlled.  Continue present medications.  3 hyperlipidemia-continue statin.  Check lipids and liver.   4 lower extremity edema-plan to continue diuretics at present dose.  Check potassium and renal function.  Kirk Ruths, MD

## 2018-07-08 ENCOUNTER — Ambulatory Visit: Payer: BC Managed Care – PPO | Admitting: Cardiology

## 2018-07-08 ENCOUNTER — Encounter: Payer: Self-pay | Admitting: Cardiology

## 2018-07-08 VITALS — BP 119/74 | HR 47 | Ht 62.0 in | Wt 213.0 lb

## 2018-07-08 DIAGNOSIS — I10 Essential (primary) hypertension: Secondary | ICD-10-CM | POA: Diagnosis not present

## 2018-07-08 DIAGNOSIS — E7800 Pure hypercholesterolemia, unspecified: Secondary | ICD-10-CM | POA: Diagnosis not present

## 2018-07-08 DIAGNOSIS — I2510 Atherosclerotic heart disease of native coronary artery without angina pectoris: Secondary | ICD-10-CM | POA: Diagnosis not present

## 2018-07-08 LAB — COMPREHENSIVE METABOLIC PANEL
ALT: 11 IU/L (ref 0–32)
AST: 10 IU/L (ref 0–40)
Albumin/Globulin Ratio: 1.7 (ref 1.2–2.2)
Albumin: 3.8 g/dL (ref 3.5–4.8)
Alkaline Phosphatase: 72 IU/L (ref 39–117)
BUN/Creatinine Ratio: 23 (ref 12–28)
BUN: 16 mg/dL (ref 8–27)
Bilirubin Total: 0.3 mg/dL (ref 0.0–1.2)
CO2: 26 mmol/L (ref 20–29)
Calcium: 9 mg/dL (ref 8.7–10.3)
Chloride: 106 mmol/L (ref 96–106)
Creatinine, Ser: 0.7 mg/dL (ref 0.57–1.00)
GFR calc Af Amer: 98 mL/min/{1.73_m2} (ref >59)
GFR calc non Af Amer: 85 mL/min/{1.73_m2} (ref >59)
Globulin, Total: 2.3 g/dL (ref 1.5–4.5)
Glucose: 88 mg/dL (ref 65–99)
Potassium: 5 mmol/L (ref 3.5–5.2)
Sodium: 143 mmol/L (ref 134–144)
Total Protein: 6.1 g/dL (ref 6.0–8.5)

## 2018-07-08 LAB — LIPID PANEL
Chol/HDL Ratio: 1.9 ratio (ref 0.0–4.4)
Cholesterol, Total: 121 mg/dL (ref 100–199)
HDL: 64 mg/dL (ref >39)
LDL Calculated: 40 mg/dL (ref 0–99)
Triglycerides: 86 mg/dL (ref 0–149)
VLDL Cholesterol Cal: 17 mg/dL (ref 5–40)

## 2018-07-08 NOTE — Patient Instructions (Signed)
Medication Instructions:   NO CHANGE  Labwork:  Your physician recommends that you HAVE LAB WORK TODAY  Follow-Up:  Your physician wants you to follow-up in: ONE YEAR WITH DR CRENSHAW You will receive a reminder letter in the mail two months in advance. If you don't receive a letter, please call our office to schedule the follow-up appointment.   If you need a refill on your cardiac medications before your next appointment, please call your pharmacy.    

## 2018-07-09 ENCOUNTER — Other Ambulatory Visit: Payer: Self-pay | Admitting: Internal Medicine

## 2018-07-09 ENCOUNTER — Encounter: Payer: Self-pay | Admitting: *Deleted

## 2018-08-07 ENCOUNTER — Other Ambulatory Visit: Payer: Self-pay | Admitting: Internal Medicine

## 2018-09-03 ENCOUNTER — Other Ambulatory Visit: Payer: Self-pay

## 2018-09-03 DIAGNOSIS — M79605 Pain in left leg: Secondary | ICD-10-CM

## 2018-09-03 DIAGNOSIS — M7989 Other specified soft tissue disorders: Principal | ICD-10-CM

## 2018-09-07 ENCOUNTER — Other Ambulatory Visit (HOSPITAL_COMMUNITY): Payer: Self-pay | Admitting: Orthopedic Surgery

## 2018-09-07 DIAGNOSIS — Z96659 Presence of unspecified artificial knee joint: Secondary | ICD-10-CM

## 2018-09-08 ENCOUNTER — Ambulatory Visit: Payer: BC Managed Care – PPO | Admitting: Podiatry

## 2018-09-10 ENCOUNTER — Other Ambulatory Visit: Payer: Self-pay | Admitting: Internal Medicine

## 2018-09-20 ENCOUNTER — Other Ambulatory Visit (HOSPITAL_BASED_OUTPATIENT_CLINIC_OR_DEPARTMENT_OTHER): Payer: Self-pay | Admitting: Specialist

## 2018-09-20 ENCOUNTER — Ambulatory Visit (HOSPITAL_BASED_OUTPATIENT_CLINIC_OR_DEPARTMENT_OTHER)
Admission: RE | Admit: 2018-09-20 | Discharge: 2018-09-20 | Disposition: A | Discharge status: Home / Self Care | Payer: BC Managed Care – PPO | Source: Ambulatory Visit | Attending: Specialist | Admitting: Specialist

## 2018-09-20 DIAGNOSIS — M79662 Pain in left lower leg: Secondary | ICD-10-CM

## 2018-09-20 DIAGNOSIS — Z96659 Presence of unspecified artificial knee joint: Secondary | ICD-10-CM | POA: Insufficient documentation

## 2018-09-27 ENCOUNTER — Encounter (HOSPITAL_COMMUNITY)
Admission: RE | Admit: 2018-09-27 | Discharge: 2018-09-27 | Disposition: A | Discharge status: Home / Self Care | Payer: BC Managed Care – PPO | Source: Ambulatory Visit | Attending: Orthopedic Surgery | Admitting: Orthopedic Surgery

## 2018-09-27 DIAGNOSIS — Z96659 Presence of unspecified artificial knee joint: Secondary | ICD-10-CM

## 2018-09-27 MED ORDER — TECHNETIUM TC 99M MEDRONATE IV KIT
20.8000 | PACK | Freq: Once | INTRAVENOUS | Status: AC | PRN
  Administered 2018-09-27: 20.8 via INTRAVENOUS

## 2018-10-03 ENCOUNTER — Other Ambulatory Visit: Payer: Self-pay | Admitting: Internal Medicine

## 2018-10-15 ENCOUNTER — Telehealth: Payer: Self-pay | Admitting: *Deleted

## 2018-10-15 ENCOUNTER — Encounter: Payer: Self-pay | Admitting: Cardiology

## 2018-10-15 NOTE — Telephone Encounter (Signed)
   Primary Cardiologist: Kirk Ruths, MD  Chart reviewed as part of pre-operative protocol coverage. Patient was contacted 10/15/2018 in reference to pre-operative risk assessment for pending surgery as outlined below.  BASIA MCGINTY was last seen on 07/08/18 by Dr. [name].  Since that day, REEMA CHICK has done well without chest pain or SOB.  She does vacuum at times without pain.  Therefore, based on ACC/AHA guidelines, the patient would be at acceptable risk for the planned procedure without further cardiovascular testing.   I will route this recommendation to the requesting party via Epic fax function and remove from pre-op pool.  Please call with questions.  Cecilie Kicks, NP 10/15/2018, 12:22 PM

## 2018-10-15 NOTE — Telephone Encounter (Signed)
Called [facility] and clearance letter has been received. I will remove from the pool.

## 2018-10-15 NOTE — Telephone Encounter (Signed)
   [facility] Pre-operative Risk Assessment    Request for surgical clearance:  1. What type of surgery is being performed? LEFT TOTAL KNEE ARTHROPLASTY  2. When is this surgery scheduled? TBD   3. What type of clearance is required (medical clearance vs. Pharmacy clearance to hold med vs. Both)? MEDICAL   4. Are there any medications that need to be held prior to surgery and how long?NONE   5. Practice name and name of physician performing surgery? [facility]  DR. Lyla Glassing    6. What is your office phone number:  [phone]     7.   What is your office fax number: [place]  8.   Anesthesia type (None, local, MAC, general) ? SPINAL   [name] M 10/15/2018, 10:59 AM  _________________________________________________________________   (provider comments below)

## 2018-10-22 ENCOUNTER — Telehealth: Payer: Self-pay | Admitting: Pulmonary Disease

## 2018-10-22 NOTE — Telephone Encounter (Signed)
Forms have been located and given to [facility] for completion.   Routing to [place] T for follow-up.

## 2018-10-26 NOTE — Telephone Encounter (Signed)
Please advise if these forms have been completed.

## 2018-10-27 NOTE — Telephone Encounter (Signed)
Will check with SN Monday 11/01/18, to see if completed.

## 2018-11-01 DIAGNOSIS — T84013A Broken internal left knee prosthesis, initial encounter: Secondary | ICD-10-CM | POA: Insufficient documentation

## 2018-11-01 NOTE — Telephone Encounter (Signed)
SN aware of form and will contact Patient once completed.

## 2018-11-01 NOTE — Telephone Encounter (Signed)
Dr. Lenna Gilford to call surgeon re guarding upcoming procedure.

## 2018-11-02 ENCOUNTER — Encounter: Payer: Self-pay | Admitting: Internal Medicine

## 2018-11-02 ENCOUNTER — Ambulatory Visit: Payer: BC Managed Care – PPO | Admitting: Internal Medicine

## 2018-11-02 VITALS — BP 126/82 | Temp 98.1°F | Ht 62.0 in | Wt 223.0 lb

## 2018-11-02 DIAGNOSIS — J449 Chronic obstructive pulmonary disease, unspecified: Secondary | ICD-10-CM

## 2018-11-02 DIAGNOSIS — I10 Essential (primary) hypertension: Secondary | ICD-10-CM

## 2018-11-02 DIAGNOSIS — Z23 Encounter for immunization: Secondary | ICD-10-CM

## 2018-11-02 DIAGNOSIS — R202 Paresthesia of skin: Secondary | ICD-10-CM | POA: Diagnosis not present

## 2018-11-02 DIAGNOSIS — Z01818 Encounter for other preprocedural examination: Secondary | ICD-10-CM | POA: Diagnosis not present

## 2018-11-02 NOTE — Assessment & Plan Note (Signed)
Ok for left knee TKR, to hold asa for 1 wk prior

## 2018-11-02 NOTE — Assessment & Plan Note (Signed)
stable overall by history and exam, recent data reviewed with pt, and pt to continue medical treatment as before,  to f/u any worsening symptoms or concerns  

## 2018-11-02 NOTE — Progress Notes (Signed)
Subjective:    Patient ID: [name], female    DOB: 1941-02-17, 77 y.o.   MRN: [id number]  HPI  Here for preop exam for left knee TKR soon, date to be determined.  Pt denies chest pain, increased sob or doe, wheezing, orthopnea, PND, increased LE swelling, palpitations, dizziness or syncope.  Pt denies new neurological symptoms such as new headache, or facial or extremity weakness or numbness   Pt denies polydipsia, polyuria, or low sugar symptoms such as weakness or confusion improved with po intake.  Pt states overall good compliance with meds, trying to follow lower cholesterol, diabetic diet, wt overall stable but little exercise however.      Pt denies fever, wt loss, night sweats, loss of appetite, or other constitutional symptoms  No other new complaints.   Takes asa 81 mg which will need to be held for 1 wk prior to procedure. Form to clear to be done later today.  Also has left hand tingling for 2 mo without pain or weakness, no prior dx of CTS Past Medical History:  Diagnosis Date  . Allergic rhinitis   . Arnold-Chiari malformation ([place])   . Asthma   . CAD (coronary artery disease)   . COPD (chronic obstructive pulmonary disease) ([place])   . DJD (degenerative joint disease)   . GERD (gastroesophageal reflux disease)   . History of absence seizures   . Hx of colonic polyps   . Hypercholesteremia   . Hypertension   . Obesity   . Positive PPD   . Reflex sympathetic dystrophy   . Varicose veins   . Venous insufficiency    Past Surgical History:  Procedure Laterality Date  . ABDOMINAL HYSTERECTOMY    . cspine surgery  1993   for arnold-chiari malformation  . JOINT REPLACEMENT  06/05/11   Left total knee replacement  . right knee arthroscopy  12/2007   Dr. Tonita Cong  . right total knee replacement  05/2008   Dr. Tonita Cong    reports that she quit smoking about 6 years ago. Her smoking use included cigarettes. She has a 3.60 pack-year smoking history. She has never used smokeless  tobacco. She reports that she does not drink alcohol or use drugs. family history includes Clotting disorder in her father; Heart disease in her mother; Stroke in her father and mother. No Known Allergies Current Outpatient Medications on File Prior to Visit  Medication Sig Dispense Refill  . albuterol (PROVENTIL HFA;VENTOLIN HFA) 108 (90 BASE) MCG/ACT inhaler Inhale 2 puffs into the lungs every 4 (four) hours as needed. For wheeze or shortness of breath 1 Inhaler 6  . alendronate (FOSAMAX) 70 MG tablet Take 1 tablet (70 mg total) by mouth every 7 (seven) days. Take with a full glass of water on an empty stomach. 12 tablet 3  . aspirin 81 MG tablet Take 81 mg by mouth daily.      . budesonide-formoterol (SYMBICORT) 160-4.5 MCG/ACT inhaler Inhale 2 puffs into the lungs 2 (two) times daily. 1 Inhaler 0  . furosemide (LASIX) 40 MG tablet TAKE 2 TABLETS BY MOUTH DAILY. ANNUAL APPT DUE IN DEC MUST SEE PROVIDER FOR FUTURE REFILLS 180 tablet 0  . meloxicam (MOBIC) 15 MG tablet TAKE 1 TABLET BY MOUTH EVERY DAY AS NEEDED FOR PAIN 30 tablet 2  . Multiple Vitamin (MULTIVITAMIN) capsule Take 1 capsule by mouth daily.      Marland Kitchen omeprazole (PRILOSEC) 40 MG capsule TAKE 1 CAPSULE BY MOUTH EVERY DAY 90 capsule  3  . OVER THE COUNTER MEDICATION     . potassium chloride SA (KLOR-CON M20) 20 MEQ tablet Take 1 tablet (20 mEq total) by mouth daily. 90 tablet 3  . pravastatin (PRAVACHOL) 40 MG tablet Take 1 tablet (40 mg total) by mouth daily. 90 tablet 3  . traZODone (DESYREL) 100 MG tablet Take 1 tablet (100 mg total) by mouth at bedtime. 90 tablet 1  . Vitamin D, Ergocalciferol, (DRISDOL) 50000 units CAPS capsule Take 50,000 Units by mouth every 7 (seven) days.     No current facility-administered medications on file prior to visit.    Review of Systems  Constitutional: Negative for other unusual diaphoresis or sweats HENT: Negative for ear discharge or swelling Eyes: Negative for other worsening visual  disturbances Respiratory: Negative for stridor or other swelling  Gastrointestinal: Negative for worsening distension or other blood Genitourinary: Negative for retention or other urinary change Musculoskeletal: Negative for other MSK pain or swelling Skin: Negative for color change or other new lesions Neurological: Negative for worsening tremors and other numbness  Psychiatric/Behavioral: Negative for worsening agitation or other fatigue All other system neg per pt    Objective:   Physical Exam BP 126/82   Temp 98.1 F (36.7 C) (Oral)   Ht 5\' 2"  (1.575 m)   Wt 223 lb (101.2 kg)   BMI 40.79 kg/m  VS noted,  Constitutional: Pt appears in NAD HENT: Head: NCAT.  Right Ear: External ear normal.  Left Ear: External ear normal.  Eyes: . Pupils are equal, round, and reactive to light. Conjunctivae and EOM are normal Nose: without d/c or deformity Neck: Neck supple. Gross normal ROM Cardiovascular: Normal rate and regular rhythm.   Pulmonary/Chest: Effort normal and breath sounds without rales or wheezing.  Abd:  Soft, NT, ND, + BS, no organomegaly Neurological: Pt is alert. At baseline orientation, motor grossly intact Skin: Skin is warm. No rashes, other new lesions, no LE edema Psychiatric: Pt behavior is normal without agitation  No other exam findings  Lab Results  Component Value Date   WBC 3.5 (L) 03/17/2018   HGB 11.7 (L) 03/17/2018   HCT 36.0 03/17/2018   PLT 191.0 03/17/2018   GLUCOSE 88 07/08/2018   CHOL 121 07/08/2018   TRIG 86 07/08/2018   HDL 64 07/08/2018   LDLCALC 40 07/08/2018   ALT 11 07/08/2018   AST 10 07/08/2018   NA 143 07/08/2018   K 5.0 07/08/2018   CL 106 07/08/2018   CREATININE 0.70 07/08/2018   BUN 16 07/08/2018   CO2 26 07/08/2018   TSH 1.31 11/16/2017   INR 1.14 06/16/2011    Declines further labs today    Assessment & Plan:

## 2018-11-02 NOTE — Patient Instructions (Addendum)
You had the Prevnar 13 pneumonia shot today  Please wear a left wrist splint at night only until the tingling is improved; if not please call for a Neurology referral  You are given the hardship letter today  Please continue all other medications as before, and refills have been done if requested.  Please have the pharmacy call with any other refills you may need.  Please continue your efforts at being more active, low cholesterol diet, and weight control.  You are otherwise up to date with prevention measures today.  Please keep your appointments with your specialists as you may have planned  We can hold on further blood work today  Please return in 6 months, or sooner if needed

## 2018-11-02 NOTE — Assessment & Plan Note (Signed)
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

## 2018-11-08 ENCOUNTER — Encounter (HOSPITAL_COMMUNITY): Payer: BC Managed Care – PPO

## 2018-11-08 ENCOUNTER — Encounter

## 2018-11-08 ENCOUNTER — Encounter: Payer: BC Managed Care – PPO | Admitting: Surgery

## 2018-11-16 ENCOUNTER — Telehealth: Payer: Self-pay | Admitting: Pulmonary Disease

## 2018-11-16 ENCOUNTER — Telehealth: Payer: Self-pay

## 2018-11-16 ENCOUNTER — Ambulatory Visit: Payer: BC Managed Care – PPO | Admitting: Podiatry

## 2018-11-16 DIAGNOSIS — M79675 Pain in left toe(s): Secondary | ICD-10-CM

## 2018-11-16 DIAGNOSIS — B351 Tinea unguium: Secondary | ICD-10-CM | POA: Diagnosis not present

## 2018-11-16 DIAGNOSIS — M79674 Pain in right toe(s): Secondary | ICD-10-CM | POA: Diagnosis not present

## 2018-11-16 NOTE — Telephone Encounter (Signed)
Per SN- Patient was seen by Dr. Jenny Reichmann, 11/02/18, for surgical clearance.  She has not been seen by Dr. Lenna Gilford since, 04/14/17.  She should be ok with Dr. Gwynn Burly clearance.

## 2018-11-16 NOTE — Telephone Encounter (Signed)
Called and left a detailed message for Patient about surgical clearance form.

## 2018-11-16 NOTE — Telephone Encounter (Signed)
Attempted to call pt but line went straight to VM. Left message on pt's machine.  [name] and Dr. Lenna Gilford, please advise if forms have been completed for pt yet as she has called our office to follow up on forms. Thanks!

## 2018-11-16 NOTE — Patient Instructions (Signed)

## 2018-11-16 NOTE — Telephone Encounter (Signed)
I am confused, b/c I was certain this was done.  We could do it again, if pt wants to have another form faxed over from Dr Jen Mow office to fill out again.

## 2018-11-16 NOTE — Telephone Encounter (Signed)
Dr. Jenny Reichmann are you aware of the surgical clearance for that the pt is requesting? Please advise.   Copied from [place] [phone]. Topic: General - Other >> Nov 16, 2018 11:05 AM Keene Breath wrote: Reason for CRM: Patient called to request a surgical clearance form be filled out and faxed to the patient's ortho doctor (Dr. Tonita Cong) so that she can have her surgery scheduled.  Patient stated that she was told the form would be sent at her last visit.  CB# [phone]

## 2018-11-16 NOTE — Telephone Encounter (Signed)
Patient returned call to office.  Explained that she has not been seen in over a year by Dr. Lenna Gilford, and that Dr. Jenny Reichmann was her PCP.  She stated understanding and had surgical clearance from Dr. Jenny Reichmann.  Nothing further at this time.

## 2018-11-16 NOTE — Telephone Encounter (Signed)
Form was located in the media tab and faxed again

## 2018-11-16 NOTE — Telephone Encounter (Signed)
Patient called back to see if the forms were completed; pt contact # [phone]

## 2018-11-22 ENCOUNTER — Ambulatory Visit: Payer: Self-pay | Admitting: Orthopedic Surgery

## 2018-12-04 ENCOUNTER — Other Ambulatory Visit: Payer: Self-pay | Admitting: Internal Medicine

## 2018-12-18 ENCOUNTER — Other Ambulatory Visit: Payer: Self-pay | Admitting: Internal Medicine

## 2018-12-18 ENCOUNTER — Encounter: Payer: Self-pay | Admitting: Podiatry

## 2018-12-18 NOTE — Progress Notes (Signed)
Subjective: [name] presents today with painful, mycotic toenails 1-5 b/l that she cannot cut and which interfere with daily activities.  Pain is aggravated when wearing enclosed shoe gear.  Biagio Borg, MD is her PCP and last visit was 11/02/2018.   Current Outpatient Medications:  .  albuterol (PROVENTIL HFA;VENTOLIN HFA) 108 (90 BASE) MCG/ACT inhaler, Inhale 2 puffs into the lungs every 4 (four) hours as needed. For wheeze or shortness of breath, Disp: 1 Inhaler, Rfl: 6 .  aspirin 81 MG tablet, Take 81 mg by mouth daily.  , Disp: , Rfl:  .  budesonide-formoterol (SYMBICORT) 160-4.5 MCG/ACT inhaler, Inhale 2 puffs into the lungs 2 (two) times daily., Disp: 1 Inhaler, Rfl: 0 .  furosemide (LASIX) 40 MG tablet, TAKE 2 TABLETS BY MOUTH DAILY. ANNUAL APPT DUE IN DEC MUST SEE PROVIDER FOR FUTURE REFILLS, Disp: 180 tablet, Rfl: 0 .  meloxicam (MOBIC) 15 MG tablet, TAKE 1 TABLET BY MOUTH EVERY DAY AS NEEDED FOR PAIN, Disp: 30 tablet, Rfl: 2 .  Multiple Vitamin (MULTIVITAMIN) capsule, Take 1 capsule by mouth daily.  , Disp: , Rfl:  .  omeprazole (PRILOSEC) 40 MG capsule, TAKE 1 CAPSULE BY MOUTH EVERY DAY, Disp: 90 capsule, Rfl: 3 .  OVER THE COUNTER MEDICATION, , Disp: , Rfl:  .  pravastatin (PRAVACHOL) 40 MG tablet, Take 1 tablet (40 mg total) by mouth daily., Disp: 90 tablet, Rfl: 3 .  traZODone (DESYREL) 100 MG tablet, Take 1 tablet (100 mg total) by mouth at bedtime., Disp: 90 tablet, Rfl: 1 .  Vitamin D, Ergocalciferol, (DRISDOL) 50000 units CAPS capsule, Take 50,000 Units by mouth every 7 (seven) days., Disp: , Rfl:  .  alendronate (FOSAMAX) 70 MG tablet, TAKE 1 TABLET (70 MG TOTAL) BY MOUTH EVERY 7 (SEVEN) DAYS. TAKE WITH A FULL GLASS OF WATER ON AN EMPTY STOMACH., Disp: 12 tablet, Rfl: 3 .  KLOR-CON M20 20 MEQ tablet, TAKE 1 TABLET BY MOUTH EVERY DAY, Disp: 90 tablet, Rfl: 1  No Known Allergies  Objective:  Vascular Examination: Capillary refill time immediate x 9  digits  Dorsalis pedis and Posterior tibial pulses palpable b/l  Digital hair absent x 9 digits  Skin temperature gradient WNL b/l  Dermatological Examination: Skin with normal turgor, texture and tone b/l  Toenails 3-5 right, 1, 4, 5 left discolored, thick, dystrophic with subungual debris and pain with palpation to nailbeds due to thickness of nails.  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 Nailbeds intact.  Musculoskeletal: Muscle strength 5/5 to all LE muscle groups  Amputation left 2nd digit  No gross bony deformities b/l.  Severe HAV with bunion deformity b/l  No pain, crepitus or joint limitation noted with ROM.   Neurological: Sensation intact with 10 gram monofilament. Vibratory sensation intact.  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 Toenails 3-5 right, 1, 4, 5 left were debrided in length and girth without iatrogenic bleeding. 2. Patient to continue soft, supportive shoe gear 3. Patient to report any pedal injuries to medical professional immediately. 4. Follow up 3 months. Patient/POA to call should there be a concern in the interim.

## 2018-12-30 ENCOUNTER — Other Ambulatory Visit: Payer: Self-pay | Admitting: Internal Medicine

## 2019-01-03 ENCOUNTER — Ambulatory Visit: Payer: Self-pay | Admitting: Orthopedic Surgery

## 2019-01-03 ENCOUNTER — Other Ambulatory Visit: Payer: Self-pay | Admitting: Internal Medicine

## 2019-01-17 ENCOUNTER — Ambulatory Visit: Payer: Self-pay | Admitting: Orthopedic Surgery

## 2019-01-17 NOTE — H&P (View-Only) (Signed)
TOTAL KNEE REVISION ADMISSION H&P  Patient is being admitted for left revision total knee arthroplasty.  Subjective:  Chief Complaint:left knee pain.  HPI: [name], 78 y.o. female, has a history of pain and functional disability in the left knee(s) due to failed previous arthroplasty and patient has failed non-surgical conservative treatments for greater than 12 weeks to include NSAID's and/or analgesics, flexibility and strengthening excercises, use of assistive devices, weight reduction as appropriate and activity modification. The indications for the revision of the total knee arthroplasty are loosening of one or more components. Onset of symptoms was gradual starting 2 years ago with rapidlly worsening course since that time.  Prior procedures on the left knee(s) include arthroplasty.  Patient currently rates pain in the left knee(s) at 10 out of 10 with activity. There is night pain, worsening of pain with activity and weight bearing, pain that interferes with activities of daily living, pain with passive range of motion and joint swelling.  Patient has evidence of prosthetic loosening by imaging studies. This condition presents safety issues increasing the risk of falls.  There is no current active infection.  Patient Active Problem List   Diagnosis Date Noted  . Preop exam for internal medicine 11/02/2018  . Left hand paresthesia 11/02/2018  . 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 Hyperopia with presbyopia, bilateral 06/29/2018  . Abdominal pain, lower 03/02/2018  . Diarrhea 03/02/2018  . Encounter for well adult exam with abnormal findings 11/14/2017  . Arnold-Chiari malformation ([place])   . Asthma   . CAD (coronary artery disease)   . COPD (chronic obstructive pulmonary disease) ([place])   . DJD (degenerative joint disease)   . GERD (gastroesophageal reflux disease)   . History of absence seizures   . Hx of colonic polyps   . Hypercholesteremia   .  Positive PPD   . Venous insufficiency   . Pain in left lower leg 07/20/2017  . Cellulitis of left lower extremity 06/18/2017  . Bilateral lower extremity edema 04/30/2017  . Shingles 02/26/2017  . COPD exacerbation ([place]) 01/07/2017  . S/P TKR (total knee replacement), bilateral 01/07/2017  . Right arm pain 05/16/2015  . Generalized anxiety disorder 08/31/2014  . Varicose veins of lower extremities with other complications 50/35/4656  . Edema 08/07/2011  . MENORRHAGIA, POSTMENOPAUSAL 08/13/2010  . CIGARETTE SMOKER 06/11/2010  . CARDIAC MURMUR 11/02/2009  . BRADYCARDIA 11/01/2009  . CHEST PAIN 11/01/2009  . ALLERGIC RHINITIS 04/19/2009  . Chronic obstructive airway disease with asthma ([place]) 04/18/2009  . LEG CRAMPS, NOCTURNAL 11/02/2008  . Essential hypertension 01/17/2008  . COLONIC POLYPS 01/14/2008  . Obesity 01/14/2008  . Reflex sympathetic dystrophy 01/14/2008  . Coronary atherosclerosis 01/14/2008  . GERD 01/14/2008  . Osteoarthritis 01/14/2008  . BACK PAIN, LUMBAR 01/14/2008  . SEIZURES, HX OF 01/14/2008   Past Medical History:  Diagnosis Date  . Allergic rhinitis   . Arnold-Chiari malformation ([place])   . Asthma   . CAD (coronary artery disease)   . COPD (chronic obstructive pulmonary disease) ([place])   . DJD (degenerative joint disease)   . GERD (gastroesophageal reflux disease)   . History of absence seizures   . Hx of colonic polyps   . Hypercholesteremia   . Hypertension   . Obesity   . Positive PPD   . Reflex sympathetic dystrophy   . Varicose veins   . Venous insufficiency     Past Surgical History:  Procedure Laterality Date  . ABDOMINAL HYSTERECTOMY    .  cspine surgery  1993   for arnold-chiari malformation  . JOINT REPLACEMENT  06/05/11   Left total knee replacement  . right knee arthroscopy  12/2007   Dr. Tonita Cong  . right total knee replacement  05/2008   Dr. Tonita Cong    Current Outpatient Medications  Medication Sig Dispense Refill Last Dose  .  albuterol (PROVENTIL HFA;VENTOLIN HFA) 108 (90 BASE) MCG/ACT inhaler Inhale 2 puffs into the lungs every 4 (four) hours as needed. For wheeze or shortness of breath 1 Inhaler 6 Taking  . alendronate (FOSAMAX) 70 MG tablet TAKE 1 TABLET (70 MG TOTAL) BY MOUTH EVERY 7 (SEVEN) DAYS. TAKE WITH A FULL GLASS OF WATER ON AN EMPTY STOMACH. 12 tablet 3   . aspirin 81 MG tablet Take 81 mg by mouth daily.     Taking  . budesonide-formoterol (SYMBICORT) 160-4.5 MCG/ACT inhaler Inhale 2 puffs into the lungs 2 (two) times daily. 1 Inhaler 0 Taking  . furosemide (LASIX) 40 MG tablet TAKE 2 TABLETS BY MOUTH DAILY. ANNUAL APPT DUE IN DEC MUST SEE PROVIDER FOR FUTURE REFILLS 180 tablet 0 Taking  . KLOR-CON M20 20 MEQ tablet TAKE 1 TABLET BY MOUTH EVERY DAY 90 tablet 1   . meloxicam (MOBIC) 15 MG tablet TAKE 1 TABLET BY MOUTH EVERY DAY AS NEEDED FOR PAIN 30 tablet 5   . Multiple Vitamin (MULTIVITAMIN) capsule Take 1 capsule by mouth daily.     Taking  . omeprazole (PRILOSEC) 40 MG capsule TAKE 1 CAPSULE BY MOUTH EVERY DAY 90 capsule 1   . OVER THE COUNTER MEDICATION    Taking  . pravastatin (PRAVACHOL) 40 MG tablet Take 1 tablet (40 mg total) by mouth daily. 90 tablet 3 Taking  . traZODone (DESYREL) 100 MG tablet Take 1 tablet (100 mg total) by mouth at bedtime. 90 tablet 1 Taking  . Vitamin D, Ergocalciferol, (DRISDOL) 50000 units CAPS capsule Take 50,000 Units by mouth every 7 (seven) days.   Taking   No current facility-administered medications for this visit.    No Known Allergies  Social History   Tobacco Use  . Smoking status: Former Smoker    Packs/day: 0.30    Years: 12.00    Pack years: 3.60    Types: Cigarettes    Last attempt to quit: 01/02/2012    Years since quitting: 7.0  . Smokeless tobacco: Never Used  . Tobacco comment: 1 pack per week  Substance Use Topics  . Alcohol use: No    Family History  Problem Relation Age of Onset  . Heart disease Mother   . Stroke Mother   . Stroke Father     . Clotting disorder Father   . Colon cancer Neg Hx   . Stomach cancer Neg Hx       Review of Systems  Constitutional: Negative.   HENT: Negative.   Eyes: Negative.   Respiratory: Negative.   Cardiovascular: Negative.   Gastrointestinal: Negative.   Genitourinary: Negative.   Musculoskeletal: Positive for joint pain.  Skin: Negative.   Neurological: Negative.   Endo/Heme/Allergies: Negative.   Psychiatric/Behavioral: Negative.      Objective:  Physical Exam  Vitals reviewed. Constitutional: She is oriented to person, place, and time. She appears well-developed and well-nourished.  HENT:  Head: Normocephalic and atraumatic.  Eyes: Pupils are equal, round, and reactive to light. Conjunctivae and EOM are normal.  Neck: Normal range of motion. Neck supple.  Cardiovascular: Normal rate, regular rhythm and intact distal pulses.  Respiratory:  Effort normal. No respiratory distress.  GI: Soft. She exhibits no distension.  Genitourinary:    Genitourinary Comments: deferred   Musculoskeletal:     Left knee: She exhibits decreased range of motion, swelling, effusion and bony tenderness.       Legs:  Neurological: She is alert and oriented to person, place, and time. She has normal reflexes.  Skin: Skin is warm and dry.  Psychiatric: She has a normal mood and affect. Her behavior is normal. Judgment and thought content normal.    Vital signs in last 24 hours: @VSRANGES @  Labs:  Estimated body mass index is 40.79 kg/m as calculated from the following:   Height as of 11/02/18: 5\' 2"  (1.575 m).   Weight as of 11/02/18: 101.2 kg.  Imaging Review Plain radiographs demonstrate cemented total knee of the left knee(s). The overall alignment is neutral.There is evidence of loosening of the tibial components. The bone quality appears to be adequate for age and reported activity level. There is fracture of medial proximal tibia.    Assessment/Plan:  End stage arthritis, left  knee(s) with failed previous arthroplasty.   The patient history, physical examination, clinical judgment of the provider and imaging studies are consistent with end stage degenerative joint disease of the left knee(s), previous total knee arthroplasty. Revision total knee arthroplasty is deemed medically necessary. The treatment options including medical management, injection therapy, arthroscopy and revision arthroplasty were discussed at length. The risks and benefits of revision total knee arthroplasty were presented and reviewed. The risks due to aseptic loosening, infection, stiffness, patella tracking problems, thromboembolic complications and other imponderables were discussed. The patient acknowledged the explanation, agreed to proceed with the plan and consent was signed. Patient is being admitted for inpatient treatment for surgery, pain control, PT, OT, prophylactic antibiotics, VTE prophylaxis, progressive ambulation and ADL's and discharge planning.The patient is planning to be discharged home with OPPT

## 2019-01-17 NOTE — H&P (Signed)
TOTAL KNEE REVISION ADMISSION H&P  Patient is being admitted for left revision total knee arthroplasty.  Subjective:  Chief Complaint:left knee pain.  HPI: [name], 78 y.o. female, has a history of pain and functional disability in the left knee(s) due to failed previous arthroplasty and patient has failed non-surgical conservative treatments for greater than 12 weeks to include NSAID's and/or analgesics, flexibility and strengthening excercises, use of assistive devices, weight reduction as appropriate and activity modification. The indications for the revision of the total knee arthroplasty are loosening of one or more components. Onset of symptoms was gradual starting 2 years ago with rapidlly worsening course since that time.  Prior procedures on the left knee(s) include arthroplasty.  Patient currently rates pain in the left knee(s) at 10 out of 10 with activity. There is night pain, worsening of pain with activity and weight bearing, pain that interferes with activities of daily living, pain with passive range of motion and joint swelling.  Patient has evidence of prosthetic loosening by imaging studies. This condition presents safety issues increasing the risk of falls.  There is no current active infection.  Patient Active Problem List   Diagnosis Date Noted  . Preop exam for internal medicine 11/02/2018  . Left hand paresthesia 11/02/2018  . 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 Hyperopia with presbyopia, bilateral 06/29/2018  . Abdominal pain, lower 03/02/2018  . Diarrhea 03/02/2018  . Encounter for well adult exam with abnormal findings 11/14/2017  . Arnold-Chiari malformation ([place])   . Asthma   . CAD (coronary artery disease)   . COPD (chronic obstructive pulmonary disease) ([place])   . DJD (degenerative joint disease)   . GERD (gastroesophageal reflux disease)   . History of absence seizures   . Hx of colonic polyps   . Hypercholesteremia   .  Positive PPD   . Venous insufficiency   . Pain in left lower leg 07/20/2017  . Cellulitis of left lower extremity 06/18/2017  . Bilateral lower extremity edema 04/30/2017  . Shingles 02/26/2017  . COPD exacerbation ([place]) 01/07/2017  . S/P TKR (total knee replacement), bilateral 01/07/2017  . Right arm pain 05/16/2015  . Generalized anxiety disorder 08/31/2014  . Varicose veins of lower extremities with other complications 09/16/5101  . Edema 08/07/2011  . MENORRHAGIA, POSTMENOPAUSAL 08/13/2010  . CIGARETTE SMOKER 06/11/2010  . CARDIAC MURMUR 11/02/2009  . BRADYCARDIA 11/01/2009  . CHEST PAIN 11/01/2009  . ALLERGIC RHINITIS 04/19/2009  . Chronic obstructive airway disease with asthma ([place]) 04/18/2009  . LEG CRAMPS, NOCTURNAL 11/02/2008  . Essential hypertension 01/17/2008  . COLONIC POLYPS 01/14/2008  . Obesity 01/14/2008  . Reflex sympathetic dystrophy 01/14/2008  . Coronary atherosclerosis 01/14/2008  . GERD 01/14/2008  . Osteoarthritis 01/14/2008  . BACK PAIN, LUMBAR 01/14/2008  . SEIZURES, HX OF 01/14/2008   Past Medical History:  Diagnosis Date  . Allergic rhinitis   . Arnold-Chiari malformation ([place])   . Asthma   . CAD (coronary artery disease)   . COPD (chronic obstructive pulmonary disease) ([place])   . DJD (degenerative joint disease)   . GERD (gastroesophageal reflux disease)   . History of absence seizures   . Hx of colonic polyps   . Hypercholesteremia   . Hypertension   . Obesity   . Positive PPD   . Reflex sympathetic dystrophy   . Varicose veins   . Venous insufficiency     Past Surgical History:  Procedure Laterality Date  . ABDOMINAL HYSTERECTOMY    .  cspine surgery  1993   for arnold-chiari malformation  . JOINT REPLACEMENT  06/05/11   Left total knee replacement  . right knee arthroscopy  12/2007   Dr. Tonita Cong  . right total knee replacement  05/2008   Dr. Tonita Cong    Current Outpatient Medications  Medication Sig Dispense Refill Last Dose  .  albuterol (PROVENTIL HFA;VENTOLIN HFA) 108 (90 BASE) MCG/ACT inhaler Inhale 2 puffs into the lungs every 4 (four) hours as needed. For wheeze or shortness of breath 1 Inhaler 6 Taking  . alendronate (FOSAMAX) 70 MG tablet TAKE 1 TABLET (70 MG TOTAL) BY MOUTH EVERY 7 (SEVEN) DAYS. TAKE WITH A FULL GLASS OF WATER ON AN EMPTY STOMACH. 12 tablet 3   . aspirin 81 MG tablet Take 81 mg by mouth daily.     Taking  . budesonide-formoterol (SYMBICORT) 160-4.5 MCG/ACT inhaler Inhale 2 puffs into the lungs 2 (two) times daily. 1 Inhaler 0 Taking  . furosemide (LASIX) 40 MG tablet TAKE 2 TABLETS BY MOUTH DAILY. ANNUAL APPT DUE IN DEC MUST SEE PROVIDER FOR FUTURE REFILLS 180 tablet 0 Taking  . KLOR-CON M20 20 MEQ tablet TAKE 1 TABLET BY MOUTH EVERY DAY 90 tablet 1   . meloxicam (MOBIC) 15 MG tablet TAKE 1 TABLET BY MOUTH EVERY DAY AS NEEDED FOR PAIN 30 tablet 5   . Multiple Vitamin (MULTIVITAMIN) capsule Take 1 capsule by mouth daily.     Taking  . omeprazole (PRILOSEC) 40 MG capsule TAKE 1 CAPSULE BY MOUTH EVERY DAY 90 capsule 1   . OVER THE COUNTER MEDICATION    Taking  . pravastatin (PRAVACHOL) 40 MG tablet Take 1 tablet (40 mg total) by mouth daily. 90 tablet 3 Taking  . traZODone (DESYREL) 100 MG tablet Take 1 tablet (100 mg total) by mouth at bedtime. 90 tablet 1 Taking  . Vitamin D, Ergocalciferol, (DRISDOL) 50000 units CAPS capsule Take 50,000 Units by mouth every 7 (seven) days.   Taking   No current facility-administered medications for this visit.    No Known Allergies  Social History   Tobacco Use  . Smoking status: Former Smoker    Packs/day: 0.30    Years: 12.00    Pack years: 3.60    Types: Cigarettes    Last attempt to quit: 01/02/2012    Years since quitting: 7.0  . Smokeless tobacco: Never Used  . Tobacco comment: 1 pack per week  Substance Use Topics  . Alcohol use: No    Family History  Problem Relation Age of Onset  . Heart disease Mother   . Stroke Mother   . Stroke Father     . Clotting disorder Father   . Colon cancer Neg Hx   . Stomach cancer Neg Hx       Review of Systems  Constitutional: Negative.   HENT: Negative.   Eyes: Negative.   Respiratory: Negative.   Cardiovascular: Negative.   Gastrointestinal: Negative.   Genitourinary: Negative.   Musculoskeletal: Positive for joint pain.  Skin: Negative.   Neurological: Negative.   Endo/Heme/Allergies: Negative.   Psychiatric/Behavioral: Negative.      Objective:  Physical Exam  Vitals reviewed. Constitutional: She is oriented to person, place, and time. She appears well-developed and well-nourished.  HENT:  Head: Normocephalic and atraumatic.  Eyes: Pupils are equal, round, and reactive to light. Conjunctivae and EOM are normal.  Neck: Normal range of motion. Neck supple.  Cardiovascular: Normal rate, regular rhythm and intact distal pulses.  Respiratory:  Effort normal. No respiratory distress.  GI: Soft. She exhibits no distension.  Genitourinary:    Genitourinary Comments: deferred   Musculoskeletal:     Left knee: She exhibits decreased range of motion, swelling, effusion and bony tenderness.       Legs:  Neurological: She is alert and oriented to person, place, and time. She has normal reflexes.  Skin: Skin is warm and dry.  Psychiatric: She has a normal mood and affect. Her behavior is normal. Judgment and thought content normal.    Vital signs in last 24 hours: @VSRANGES @  Labs:  Estimated body mass index is 40.79 kg/m as calculated from the following:   Height as of 11/02/18: 5\' 2"  (1.575 m).   Weight as of 11/02/18: 101.2 kg.  Imaging Review Plain radiographs demonstrate cemented total knee of the left knee(s). The overall alignment is neutral.There is evidence of loosening of the tibial components. The bone quality appears to be adequate for age and reported activity level. There is fracture of medial proximal tibia.    Assessment/Plan:  End stage arthritis, left  knee(s) with failed previous arthroplasty.   The patient history, physical examination, clinical judgment of the provider and imaging studies are consistent with end stage degenerative joint disease of the left knee(s), previous total knee arthroplasty. Revision total knee arthroplasty is deemed medically necessary. The treatment options including medical management, injection therapy, arthroscopy and revision arthroplasty were discussed at length. The risks and benefits of revision total knee arthroplasty were presented and reviewed. The risks due to aseptic loosening, infection, stiffness, patella tracking problems, thromboembolic complications and other imponderables were discussed. The patient acknowledged the explanation, agreed to proceed with the plan and consent was signed. Patient is being admitted for inpatient treatment for surgery, pain control, PT, OT, prophylactic antibiotics, VTE prophylaxis, progressive ambulation and ADL's and discharge planning.The patient is planning to be discharged home with OPPT

## 2019-01-21 DIAGNOSIS — G5602 Carpal tunnel syndrome, left upper limb: Secondary | ICD-10-CM | POA: Insufficient documentation

## 2019-01-21 DIAGNOSIS — M79642 Pain in left hand: Secondary | ICD-10-CM | POA: Insufficient documentation

## 2019-01-26 ENCOUNTER — Inpatient Hospital Stay: Admit: 2019-01-26 | Payer: BC Managed Care – PPO | Admitting: Orthopedic Surgery

## 2019-01-26 SURGERY — TOTAL KNEE REVISION
Anesthesia: Spinal | Laterality: Left

## 2019-01-26 NOTE — Patient Instructions (Signed)
[name]  01/26/2019   Your procedure is scheduled on: 02-03-2019    Report to [facility] Main  Entrance     Report to admitting at 7:00AM    Call this number if you have problems the morning of surgery [phone]      Remember: Do not eat food or drink liquids :After Midnight. BRUSH YOUR TEETH MORNING OF SURGERY AND RINSE YOUR MOUTH OUT, NO CHEWING GUM CANDY OR MINTS.     Take these medicines the morning of surgery with A SIP OF WATER: omeprazole, Symbicort inhaler, albuterol inhaler if needed, nasal sprays if needed                                  You may not have any metal on your body including hair pins and              piercings  Do not wear jewelry, make-up, lotions, powders or perfumes, deodorant             Do not wear nail polish.  Do not shave  48 hours prior to surgery.     Do not bring valuables to the hospital. [place].  Contacts, dentures or bridgework may not be worn into surgery.  Leave suitcase in the car. After surgery it may be brought to your room.                  Please read over the following fact sheets you were given: _____________________________________________________________________             [facility] - Preparing for Surgery Before surgery, you can play an important role.  Because skin is not sterile, your skin needs to be as free of germs as possible.  You can reduce the number of germs on your skin by washing with CHG (chlorahexidine gluconate) soap before surgery.  CHG is an antiseptic cleaner which kills germs and bonds with the skin to continue killing germs even after washing. Please DO NOT use if you have an allergy to CHG or antibacterial soaps.  If your skin becomes reddened/irritated stop using the CHG and inform your nurse when you arrive at Short Stay. Do not shave (including legs and underarms) for at least 48 hours prior to the first CHG  shower.  You may shave your face/neck. Please follow these instructions carefully:  1.  Shower with CHG Soap the night before surgery and the  morning of Surgery.  2.  If you choose to wash your hair, wash your hair first as usual with your  normal  shampoo.  3.  After you shampoo, rinse your hair and body thoroughly to remove the  shampoo.                           4.  Use CHG as you would any other liquid soap.  You can apply chg directly  to the skin and wash                       Gently with a scrungie or clean washcloth.  5.  Apply the CHG Soap to your body ONLY FROM THE  NECK DOWN.   Do not use on face/ open                           Wound or open sores. Avoid contact with eyes, ears mouth and genitals (private parts).                       Wash face,  Genitals (private parts) with your normal soap.             6.  Wash thoroughly, paying special attention to the area where your surgery  will be performed.  7.  Thoroughly rinse your body with warm water from the neck down.  8.  DO NOT shower/wash with your normal soap after using and rinsing off  the CHG Soap.                9.  Pat yourself dry with a clean towel.            10.  Wear clean pajamas.            11.  Place clean sheets on your bed the night of your first shower and do not  sleep with pets. Day of Surgery : Do not apply any lotions/deodorants the morning of surgery.  Please wear clean clothes to the hospital/surgery center.  FAILURE TO FOLLOW THESE INSTRUCTIONS MAY RESULT IN THE CANCELLATION OF YOUR SURGERY PATIENT SIGNATURE_________________________________  NURSE SIGNATURE__________________________________  ________________________________________________________________________   Adam Phenix  An incentive spirometer is a tool that can help keep your lungs clear and active. This tool measures how well you are filling your lungs with each breath. Taking long deep breaths may help reverse or decrease the chance  of developing breathing (pulmonary) problems (especially infection) following:  A long period of time when you are unable to move or be active. BEFORE THE PROCEDURE   If the spirometer includes an indicator to show your best effort, your nurse or respiratory therapist will set it to a desired goal.  If possible, sit up straight or lean slightly forward. Try not to slouch.  Hold the incentive spirometer in an upright position. INSTRUCTIONS FOR USE  1. Sit on the edge of your bed if possible, or sit up as far as you can in bed or on a chair. 2. Hold the incentive spirometer in an upright position. 3. Breathe out normally. 4. Place the mouthpiece in your mouth and seal your lips tightly around it. 5. Breathe in slowly and as deeply as possible, raising the piston or the ball toward the top of the column. 6. Hold your breath for 3-5 seconds or for as long as possible. Allow the piston or ball to fall to the bottom of the column. 7. Remove the mouthpiece from your mouth and breathe out normally. 8. Rest for a few seconds and repeat Steps 1 through 7 at least 10 times every 1-2 hours when you are awake. Take your time and take a few normal breaths between deep breaths. 9. The spirometer may include an indicator to show your best effort. Use the indicator as a goal to work toward during each repetition. 10. After each set of 10 deep breaths, practice coughing to be sure your lungs are clear. If you have an incision (the cut made at the time of surgery), support your incision when coughing by placing a pillow or rolled up towels firmly against it. Once you are able  to get out of bed, walk around indoors and cough well. You may stop using the incentive spirometer when instructed by your caregiver.  RISKS AND COMPLICATIONS  Take your time so you do not get dizzy or light-headed.  If you are in pain, you may need to take or ask for pain medication before doing incentive spirometry. It is harder to  take a deep breath if you are having pain. AFTER USE  Rest and breathe slowly and easily.  It can be helpful to keep track of a log of your progress. Your caregiver can provide you with a simple table to help with this. If you are using the spirometer at home, follow these instructions: [place] IF:   You are having difficultly using the spirometer.  You have trouble using the spirometer as often as instructed.  Your pain medication is not giving enough relief while using the spirometer.  You develop fever of 100.5 F (38.1 C) or higher. SEEK IMMEDIATE MEDICAL CARE IF:   You cough up bloody sputum that had not been present before.  You develop fever of 102 F (38.9 C) or greater.  You develop worsening pain at or near the incision site. MAKE SURE YOU:   Understand these instructions.  Will watch your condition.  Will get help right away if you are not doing well or get worse. Document Released: 03/30/2007 Document Revised: 02/09/2012 Document Reviewed: 05/31/2007 ExitCare Patient Information 2014 ExitCare, [place].   ________________________________________________________________________  WHAT IS A BLOOD TRANSFUSION? Blood Transfusion Information  A transfusion is the replacement of blood or some of its parts. Blood is made up of multiple cells which provide different functions.  Red blood cells carry oxygen and are used for blood loss replacement.  White blood cells fight against infection.  Platelets control bleeding.  Plasma helps clot blood.  Other blood products are available for specialized needs, such as hemophilia or other clotting disorders. BEFORE THE TRANSFUSION  Who gives blood for transfusions?   Healthy volunteers who are fully evaluated to make sure their blood is safe. This is blood bank blood. Transfusion therapy is the safest it has ever been in the practice of medicine. Before blood is taken from a donor, a complete history is taken to  make sure that person has no history of diseases nor engages in risky social behavior (examples are intravenous drug use or sexual activity with multiple partners). The donor's travel history is screened to minimize risk of transmitting infections, such as malaria. The donated blood is tested for signs of infectious diseases, such as HIV and hepatitis. The blood is then tested to be sure it is compatible with you in order to minimize the chance of a transfusion reaction. If you or a relative donates blood, this is often done in anticipation of surgery and is not appropriate for emergency situations. It takes many days to process the donated blood. RISKS AND COMPLICATIONS Although transfusion therapy is very safe and saves many lives, the main dangers of transfusion include:   Getting an infectious disease.  Developing a transfusion reaction. This is an allergic reaction to something in the blood you were given. Every precaution is taken to prevent this. The decision to have a blood transfusion has been considered carefully by your caregiver before blood is given. Blood is not given unless the benefits outweigh the risks. AFTER THE TRANSFUSION  Right after receiving a blood transfusion, you will usually feel much better and more energetic. This is especially true  if your red blood cells have gotten low (anemic). The transfusion raises the level of the red blood cells which carry oxygen, and this usually causes an energy increase.  The nurse administering the transfusion will monitor you carefully for complications. HOME CARE INSTRUCTIONS  No special instructions are needed after a transfusion. You may find your energy is better. Speak with your caregiver about any limitations on activity for underlying diseases you may have. SEEK MEDICAL CARE IF:   Your condition is not improving after your transfusion.  You develop redness or irritation at the intravenous (IV) site. SEEK IMMEDIATE MEDICAL CARE  IF:  Any of the following symptoms occur over the next 12 hours:  Shaking chills.  You have a temperature by mouth above 102 F (38.9 C), not controlled by medicine.  Chest, back, or muscle pain.  People around you feel you are not acting correctly or are confused.  Shortness of breath or difficulty breathing.  Dizziness and fainting.  You get a rash or develop hives.  You have a decrease in urine output.  Your urine turns a dark color or changes to pink, red, or brown. Any of the following symptoms occur over the next 10 days:  You have a temperature by mouth above 102 F (38.9 C), not controlled by medicine.  Shortness of breath.  Weakness after normal activity.  The white part of the eye turns yellow (jaundice).  You have a decrease in the amount of urine or are urinating less often.  Your urine turns a dark color or changes to pink, red, or brown. Document Released: 11/14/2000 Document Revised: 02/09/2012 Document Reviewed: 07/03/2008 [facility] Patient Information 2014 [place], [place].  _______________________________________________________________________

## 2019-01-26 NOTE — Progress Notes (Signed)
Cardiac clearance - see tele note epic 10-15-18 Cecilie Kicks , NP  lov/Medical clearance Dr Cathlean Cower 11-02-18 epic   lov cardiology Dr Kirk Ruths 07-08-18 epic   ekg 07-08-18 epic   Echo 03-26-18 epic

## 2019-01-27 ENCOUNTER — Other Ambulatory Visit: Payer: Self-pay

## 2019-01-27 ENCOUNTER — Encounter (HOSPITAL_COMMUNITY): Payer: Self-pay

## 2019-01-27 ENCOUNTER — Encounter (HOSPITAL_COMMUNITY)
Admission: RE | Admit: 2019-01-27 | Discharge: 2019-01-27 | Disposition: A | Discharge status: Home / Self Care | Payer: BC Managed Care – PPO | Source: Ambulatory Visit | Attending: Orthopedic Surgery | Admitting: Orthopedic Surgery

## 2019-01-27 DIAGNOSIS — Z01812 Encounter for preprocedural laboratory examination: Secondary | ICD-10-CM | POA: Insufficient documentation

## 2019-01-27 HISTORY — DX: Cough, unspecified: R05.9

## 2019-01-27 HISTORY — DX: Cough: R05

## 2019-01-27 LAB — BASIC METABOLIC PANEL
Anion gap: 8 (ref 5–15)
BUN: 12 mg/dL (ref 8–23)
CO2: 27 mmol/L (ref 22–32)
Calcium: 9.1 mg/dL (ref 8.9–10.3)
Chloride: 107 mmol/L (ref 98–111)
Creatinine, Ser: 0.63 mg/dL (ref 0.44–1.00)
GFR calc Af Amer: >60 mL/min (ref >60)
GFR calc non Af Amer: >60 mL/min (ref >60)
Glucose, Bld: 95 mg/dL (ref 70–99)
Potassium: 4.3 mmol/L (ref 3.5–5.1)
Sodium: 142 mmol/L (ref 135–145)

## 2019-01-27 LAB — CBC
HCT: 39 % (ref 36.0–46.0)
Hemoglobin: 11.5 g/dL — ABNORMAL LOW (ref 12.0–15.0)
MCH: 26.8 pg (ref 26.0–34.0)
MCHC: 29.5 g/dL — ABNORMAL LOW (ref 30.0–36.0)
MCV: 90.9 fL (ref 80.0–100.0)
Platelets: 191 10*3/uL (ref 150–400)
RBC: 4.29 MIL/uL (ref 3.87–5.11)
RDW: 13.2 % (ref 11.5–15.5)
WBC: 4.2 10*3/uL (ref 4.0–10.5)
nRBC: 0 % (ref 0.0–0.2)

## 2019-01-27 LAB — SURGICAL PCR SCREEN
MRSA, PCR: POSITIVE — AB
Staphylococcus aureus: POSITIVE — AB

## 2019-01-27 NOTE — Progress Notes (Addendum)
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 Rod Can.   Pt last seen by PCP, Dr. Cathlean Cower, on 11/02/18.  Per his note, "Ok for left knee TKR, to hold asa for 1 wk prior."  Cardiac clearance received 10/15/18.  Per Cecilie Kicks, NP, "[name] was last seen on 07/08/18 by Dr. [name].  Since that day, DAELYN PETTAWAY has done well without chest pain or SOB.  She does vacuum at times without pain.  Therefore, based on ACC/AHA guidelines, the patient would be at acceptable risk for the planned procedure without further cardiovascular testing."   Pt reports she was recently started on cefdinir 10 day course for upper respiratory infection.  On exam wheezing noted bilaterally lower lobes.  Pt experiencing productive cough.  She denies shortness of breath, chest tightness, fever, chills.  Discussed with Dr. Dannielle Huh scheduler.  Recommend surgery be postponed until sx have resolved.   Addendum 02/02/19: Spoke with Caryl Pina, Dr. Sid Falcon scheduler who reports she spoke with pt and the patient is feeling much better.  She reports the patient told her her PCP was ok with her proceeding with surgery if cough had resolved, she reported to [place] she is no longer experiencing a cough.    Discussed with Dr. Glennon Mac.  Ok to evaluate patient DOS.  Spoke with pt and advised her to use inhaler tonight and in the AM per Dr. Glennon Mac.  Pt expressed understanding.  VS: BP (!) 161/88 Comment: right arm   Pulse (!) 52   Temp 36.5 C (Oral)   Resp 18   Ht 5\' 4"  (1.626 m)   Wt 101.2 kg   SpO2 100%    BMI 38.28 kg/m   PROVIDERS: Biagio Borg, MD is PCP   Kirk Ruths, MD is Cardiologist  LABS: Labs reviewed: Acceptable for surgery. (all labs ordered are listed, but only abnormal results are displayed)  Labs Reviewed  SURGICAL PCR SCREEN - Abnormal; Notable for the following components:      Result Value   MRSA, PCR POSITIVE (*)    Staphylococcus aureus POSITIVE (*)    All other components within normal limits  CBC - Abnormal; Notable for the following components:   Hemoglobin 11.5 (*)    MCHC 29.5 (*)    All other components within normal limits  BASIC METABOLIC PANEL  TYPE AND SCREEN     IMAGES:   EKG: 07/08/2018 Rate 47 bpm Sinus bradycardia Nonspecific T wave abnormality Abnormal ECG  CV: Echo 03/26/18 Study Conclusions  - Left ventricle: The cavity size was normal. Wall thickness was   increased in a pattern of mild LVH. Systolic function was normal.   The estimated ejection fraction was in the range of 55% to 60%.   Wall motion was normal; there were no regional wall motion   abnormalities. Doppler parameters are consistent with abnormal   left ventricular relaxation (grade 1 diastolic dysfunction). - Aortic valve: Mildly calcified annulus. Trileaflet. - Mitral valve: Mildly thickened leaflets . - Right ventricle: The  cavity size was mildly dilated. Wall   thickness was normal. Systolic function was low normal. - Right atrium: The atrium was mildly dilated. - Tricuspid valve: There was mild regurgitation. - Pulmonary arteries: PA peak pressure: 36 mm Hg (S).  Stress Test 12/28/2014 Overall Impression:  Normal stress nuclear study. Past Medical History:  Diagnosis Date  . Allergic rhinitis   . 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 Asthma   . CAD (coronary artery disease)   . COPD (chronic obstructive pulmonary disease) ([place])   . Coughing    coughing since last 01-22-2019 , started on cefdinir bid x10 days, has 3 pills left today , reports she  feels mcuh better , still coughing copiuus amonts of thick white sputum , denies fever nor chills, nor body aches .   Marland Kitchen DJD (degenerative joint disease)   . GERD (gastroesophageal reflux disease)   . 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 Hx of colonic polyps   . Hypercholesteremia   . Hypertension   . Obesity   . Positive PPD   . Reflex sympathetic dystrophy   . Varicose veins   . Venous insufficiency     Past Surgical History:  Procedure Laterality Date  . ABDOMINAL HYSTERECTOMY    . cspine surgery  1993   for arnold-chiari malformation  . JOINT REPLACEMENT  06/05/11   Left total knee replacement  . right knee arthroscopy  12/2007   Dr. Tonita Cong  . right total knee replacement  05/2008   Dr. Tonita Cong    MEDICATIONS: . cefdinir (OMNICEF) 300 MG capsule  . acetaminophen (TYLENOL) 500 MG tablet  . albuterol (PROVENTIL HFA;VENTOLIN HFA) 108 (90 BASE) MCG/ACT inhaler  . alendronate (FOSAMAX) 70 MG tablet  . aspirin 81 MG tablet  . azelastine (ASTELIN) 0.1 % nasal spray  . budesonide-formoterol (SYMBICORT) 160-4.5 MCG/ACT inhaler  . ferrous sulfate 325 (65 FE) MG EC tablet  . fluticasone (FLONASE) 50 MCG/ACT nasal spray  . furosemide (LASIX) 40 MG tablet  . KLOR-CON M20 20 MEQ tablet  . meloxicam (MOBIC) 15 MG tablet  . Menthol (ICY HOT BACK EXTRA STRENGTH) 5 % PTCH  . montelukast (SINGULAIR) 10 MG tablet  . naproxen sodium (ALEVE) 220 MG tablet  . omeprazole (PRILOSEC) 40 MG capsule  . pravastatin (PRAVACHOL) 40 MG tablet  . traZODone (DESYREL) 100 MG tablet   No current facility-administered medications for this encounter.      Maia Plan [facility] Pre-Surgical Testing [phone] 01/27/19 3:47 PM

## 2019-02-02 MED ORDER — VANCOMYCIN HCL 10 G IV SOLR
1500.0000 mg | INTRAVENOUS | Status: AC
  Administered 2019-02-03: 1500 mg via INTRAVENOUS
  Filled 2019-02-02: qty 1500

## 2019-02-03 ENCOUNTER — Encounter (HOSPITAL_COMMUNITY): Payer: Self-pay | Admitting: *Deleted

## 2019-02-03 ENCOUNTER — Inpatient Hospital Stay (HOSPITAL_COMMUNITY)
Admission: RE | Admit: 2019-02-03 | Discharge: 2019-02-07 | DRG: 467 | Disposition: A | Discharge status: Home Health | Payer: BC Managed Care – PPO | Source: Other Acute Inpatient Hospital | Attending: Orthopedic Surgery | Admitting: Orthopedic Surgery

## 2019-02-03 ENCOUNTER — Inpatient Hospital Stay (HOSPITAL_COMMUNITY): Payer: BC Managed Care – PPO

## 2019-02-03 ENCOUNTER — Encounter (HOSPITAL_COMMUNITY)
Admission: RE | Disposition: A | Discharge status: Home Health | Payer: Self-pay | Source: Other Acute Inpatient Hospital | Attending: Orthopedic Surgery

## 2019-02-03 ENCOUNTER — Inpatient Hospital Stay (HOSPITAL_COMMUNITY): Payer: BC Managed Care – PPO | Admitting: Physician Assistant

## 2019-02-03 ENCOUNTER — Inpatient Hospital Stay (HOSPITAL_COMMUNITY): Payer: BC Managed Care – PPO | Admitting: Certified Registered Nurse Anesthetist

## 2019-02-03 ENCOUNTER — Other Ambulatory Visit: Payer: Self-pay

## 2019-02-03 DIAGNOSIS — Z7982 Long term (current) use of aspirin: Secondary | ICD-10-CM | POA: Diagnosis not present

## 2019-02-03 DIAGNOSIS — E7800 Pure hypercholesterolemia, unspecified: Secondary | ICD-10-CM | POA: Diagnosis present

## 2019-02-03 DIAGNOSIS — Z8249 Family history of ischemic heart disease and other diseases of the circulatory system: Secondary | ICD-10-CM | POA: Diagnosis not present

## 2019-02-03 DIAGNOSIS — J449 Chronic obstructive pulmonary disease, unspecified: Secondary | ICD-10-CM | POA: Diagnosis present

## 2019-02-03 DIAGNOSIS — G9050 Complex regional pain syndrome I, unspecified: Secondary | ICD-10-CM | POA: Diagnosis present

## 2019-02-03 DIAGNOSIS — D62 Acute posthemorrhagic anemia: Secondary | ICD-10-CM | POA: Diagnosis not present

## 2019-02-03 DIAGNOSIS — Z823 Family history of stroke: Secondary | ICD-10-CM | POA: Diagnosis not present

## 2019-02-03 DIAGNOSIS — Z7951 Long term (current) use of inhaled steroids: Secondary | ICD-10-CM

## 2019-02-03 DIAGNOSIS — Z87891 Personal history of nicotine dependence: Secondary | ICD-10-CM | POA: Diagnosis not present

## 2019-02-03 DIAGNOSIS — Z96652 Presence of left artificial knee joint: Secondary | ICD-10-CM

## 2019-02-03 DIAGNOSIS — Z7983 Long term (current) use of bisphosphonates: Secondary | ICD-10-CM

## 2019-02-03 DIAGNOSIS — K219 Gastro-esophageal reflux disease without esophagitis: Secondary | ICD-10-CM | POA: Diagnosis present

## 2019-02-03 DIAGNOSIS — T84033A Mechanical loosening of internal left knee prosthetic joint, initial encounter: Principal | ICD-10-CM | POA: Diagnosis present

## 2019-02-03 DIAGNOSIS — Y792 Prosthetic and other implants, materials and accessory orthopedic devices associated with adverse incidents: Secondary | ICD-10-CM | POA: Diagnosis present

## 2019-02-03 DIAGNOSIS — Z8719 Personal history of other diseases of the digestive system: Secondary | ICD-10-CM

## 2019-02-03 DIAGNOSIS — Z832 Family history of diseases of the blood and blood-forming organs and certain disorders involving the immune mechanism: Secondary | ICD-10-CM

## 2019-02-03 DIAGNOSIS — Z96651 Presence of right artificial knee joint: Secondary | ICD-10-CM | POA: Diagnosis present

## 2019-02-03 DIAGNOSIS — M25562 Pain in left knee: Secondary | ICD-10-CM | POA: Diagnosis present

## 2019-02-03 DIAGNOSIS — Z79899 Other long term (current) drug therapy: Secondary | ICD-10-CM | POA: Diagnosis not present

## 2019-02-03 DIAGNOSIS — I10 Essential (primary) hypertension: Secondary | ICD-10-CM | POA: Diagnosis present

## 2019-02-03 DIAGNOSIS — T84093A Other mechanical complication of internal left knee prosthesis, initial encounter: Secondary | ICD-10-CM

## 2019-02-03 DIAGNOSIS — I2510 Atherosclerotic heart disease of native coronary artery without angina pectoris: Secondary | ICD-10-CM | POA: Diagnosis present

## 2019-02-03 HISTORY — PX: TOTAL KNEE REVISION: SHX996

## 2019-02-03 SURGERY — TOTAL KNEE REVISION
Anesthesia: Spinal | Site: Knee | Laterality: Left

## 2019-02-03 MED ORDER — ACETAMINOPHEN 10 MG/ML IV SOLN
1000.0000 mg | INTRAVENOUS | Status: AC
  Administered 2019-02-03: 1000 mg via INTRAVENOUS
  Filled 2019-02-03: qty 100

## 2019-02-03 MED ORDER — METOCLOPRAMIDE HCL 5 MG/ML IJ SOLN: 5.0000 mg | Freq: Three times a day (TID) | INTRAMUSCULAR | Status: DC | PRN

## 2019-02-03 MED ORDER — DIPHENHYDRAMINE HCL 12.5 MG/5ML PO ELIX: 12.5000 mg | ORAL_SOLUTION | ORAL | Status: DC | PRN

## 2019-02-03 MED ORDER — TRANEXAMIC ACID-NACL 1000-0.7 MG/100ML-% IV SOLN
1000.0000 mg | INTRAVENOUS | Status: AC
  Administered 2019-02-03: 1000 mg via INTRAVENOUS
  Filled 2019-02-03: qty 100

## 2019-02-03 MED ORDER — HYDROCODONE-ACETAMINOPHEN 7.5-325 MG PO TABS
1.0000 | ORAL_TABLET | ORAL | Status: DC | PRN
  Administered 2019-02-04: 2 via ORAL
  Administered 2019-02-06: 1 via ORAL
  Administered 2019-02-07 (×2): 2 via ORAL
  Filled 2019-02-03 (×4): qty 2

## 2019-02-03 MED ORDER — SODIUM CHLORIDE (PF) 0.9 % IJ SOLN
INTRAMUSCULAR | Status: AC
  Filled 2019-02-03: qty 50

## 2019-02-03 MED ORDER — BUPIVACAINE-EPINEPHRINE (PF) 0.25% -1:200000 IJ SOLN
INTRAMUSCULAR | Status: AC
  Filled 2019-02-03: qty 30

## 2019-02-03 MED ORDER — ONDANSETRON HCL 4 MG/2ML IJ SOLN
INTRAMUSCULAR | Status: AC
  Filled 2019-02-03: qty 2

## 2019-02-03 MED ORDER — ONDANSETRON HCL 4 MG/2ML IJ SOLN: 4.0000 mg | Freq: Four times a day (QID) | INTRAMUSCULAR | Status: DC | PRN

## 2019-02-03 MED ORDER — MENTHOL 3 MG MT LOZG: 1.0000 | LOZENGE | OROMUCOSAL | Status: DC | PRN

## 2019-02-03 MED ORDER — FENTANYL CITRATE (PF) 100 MCG/2ML IJ SOLN
INTRAMUSCULAR | Status: DC | PRN
  Administered 2019-02-03 (×2): 25 ug via INTRAVENOUS
  Administered 2019-02-03: 50 ug via INTRAVENOUS
  Administered 2019-02-03 (×4): 25 ug via INTRAVENOUS

## 2019-02-03 MED ORDER — MORPHINE SULFATE (PF) 2 MG/ML IV SOLN: 0.5000 mg | INTRAVENOUS | Status: DC | PRN

## 2019-02-03 MED ORDER — BUPIVACAINE IN DEXTROSE 0.75-8.25 % IT SOLN
INTRATHECAL | Status: DC | PRN
  Administered 2019-02-03: 1.8 mL via INTRATHECAL

## 2019-02-03 MED ORDER — FUROSEMIDE 40 MG PO TABS: 40.0000 mg | ORAL_TABLET | Freq: Every day | ORAL | Status: DC | PRN

## 2019-02-03 MED ORDER — SODIUM CHLORIDE 0.9 % IR SOLN
Status: DC | PRN
  Administered 2019-02-03: 3000 mL
  Administered 2019-02-03: 1000 mL

## 2019-02-03 MED ORDER — STERILE WATER FOR IRRIGATION IR SOLN
Status: DC | PRN
  Administered 2019-02-03 (×2): 1000 mL

## 2019-02-03 MED ORDER — HYDROCODONE-ACETAMINOPHEN 5-325 MG PO TABS
1.0000 | ORAL_TABLET | ORAL | Status: DC | PRN
  Administered 2019-02-03: 1 via ORAL
  Administered 2019-02-04 – 2019-02-05 (×4): 2 via ORAL
  Filled 2019-02-03 (×4): qty 2
  Filled 2019-02-03: qty 1

## 2019-02-03 MED ORDER — AZELASTINE HCL 0.1 % NA SOLN: 1.0000 | Freq: Every day | NASAL | Status: DC | PRN

## 2019-02-03 MED ORDER — ALBUTEROL SULFATE (2.5 MG/3ML) 0.083% IN NEBU
2.5000 mg | INHALATION_SOLUTION | RESPIRATORY_TRACT | Status: DC | PRN
  Administered 2019-02-04: 2.5 mg via RESPIRATORY_TRACT
  Filled 2019-02-03: qty 3

## 2019-02-03 MED ORDER — PROPOFOL 10 MG/ML IV BOLUS
INTRAVENOUS | Status: AC
  Filled 2019-02-03: qty 20

## 2019-02-03 MED ORDER — LACTATED RINGERS IV SOLN
INTRAVENOUS | Status: DC
  Administered 2019-02-03 (×4): via INTRAVENOUS

## 2019-02-03 MED ORDER — SODIUM CHLORIDE (PF) 0.9 % IJ SOLN
INTRAMUSCULAR | Status: DC | PRN
  Administered 2019-02-03: 30 mL

## 2019-02-03 MED ORDER — FUROSEMIDE 40 MG PO TABS
40.0000 mg | ORAL_TABLET | Freq: Every day | ORAL | Status: DC
  Administered 2019-02-04 – 2019-02-06 (×2): 40 mg via ORAL
  Filled 2019-02-03 (×4): qty 1

## 2019-02-03 MED ORDER — POTASSIUM CHLORIDE CRYS ER 20 MEQ PO TBCR
20.0000 meq | EXTENDED_RELEASE_TABLET | Freq: Every evening | ORAL | Status: DC
  Administered 2019-02-03 – 2019-02-06 (×4): 20 meq via ORAL
  Filled 2019-02-03 (×4): qty 1

## 2019-02-03 MED ORDER — METHOCARBAMOL 500 MG IVPB - SIMPLE MED
500.0000 mg | Freq: Four times a day (QID) | INTRAVENOUS | Status: DC | PRN
  Filled 2019-02-03: qty 50

## 2019-02-03 MED ORDER — DEXAMETHASONE SODIUM PHOSPHATE 10 MG/ML IJ SOLN
10.0000 mg | Freq: Once | INTRAMUSCULAR | Status: DC
  Filled 2019-02-03: qty 1

## 2019-02-03 MED ORDER — FENTANYL CITRATE (PF) 100 MCG/2ML IJ SOLN
INTRAMUSCULAR | Status: AC
  Filled 2019-02-03: qty 2

## 2019-02-03 MED ORDER — DEXAMETHASONE SODIUM PHOSPHATE 10 MG/ML IJ SOLN
INTRAMUSCULAR | Status: DC | PRN
  Administered 2019-02-03: 10 mg via INTRAVENOUS

## 2019-02-03 MED ORDER — FLUTICASONE PROPIONATE 50 MCG/ACT NA SUSP: 1.0000 | Freq: Two times a day (BID) | NASAL | Status: DC | PRN

## 2019-02-03 MED ORDER — PROMETHAZINE HCL 25 MG/ML IJ SOLN
6.2500 mg | INTRAMUSCULAR | Status: DC | PRN
  Administered 2019-02-03: 6.25 mg via INTRAVENOUS

## 2019-02-03 MED ORDER — ALBUTEROL SULFATE HFA 108 (90 BASE) MCG/ACT IN AERS: 2.0000 | INHALATION_SPRAY | RESPIRATORY_TRACT | Status: DC | PRN

## 2019-02-03 MED ORDER — SODIUM CHLORIDE 0.9 % IV SOLN: INTRAVENOUS | Status: DC

## 2019-02-03 MED ORDER — SENNA 8.6 MG PO TABS
1.0000 | ORAL_TABLET | Freq: Two times a day (BID) | ORAL | Status: DC
  Administered 2019-02-03 – 2019-02-07 (×8): 8.6 mg via ORAL
  Filled 2019-02-03 (×8): qty 1

## 2019-02-03 MED ORDER — FENTANYL CITRATE (PF) 100 MCG/2ML IJ SOLN
50.0000 ug | INTRAMUSCULAR | Status: DC
  Administered 2019-02-03: 100 ug via INTRAVENOUS
  Filled 2019-02-03: qty 2

## 2019-02-03 MED ORDER — DEXAMETHASONE SODIUM PHOSPHATE 10 MG/ML IJ SOLN
INTRAMUSCULAR | Status: AC
  Filled 2019-02-03: qty 1

## 2019-02-03 MED ORDER — POLYETHYLENE GLYCOL 3350 17 G PO PACK: 17.0000 g | PACK | Freq: Every day | ORAL | Status: DC | PRN

## 2019-02-03 MED ORDER — ACETAMINOPHEN 325 MG PO TABS
325.0000 mg | ORAL_TABLET | Freq: Four times a day (QID) | ORAL | Status: DC | PRN
  Administered 2019-02-05 – 2019-02-06 (×3): 650 mg via ORAL
  Filled 2019-02-03 (×5): qty 2

## 2019-02-03 MED ORDER — APIXABAN 2.5 MG PO TABS
2.5000 mg | ORAL_TABLET | Freq: Two times a day (BID) | ORAL | Status: DC
  Administered 2019-02-04 – 2019-02-07 (×7): 2.5 mg via ORAL
  Filled 2019-02-03 (×7): qty 1

## 2019-02-03 MED ORDER — METOCLOPRAMIDE HCL 5 MG PO TABS: 5.0000 mg | ORAL_TABLET | Freq: Three times a day (TID) | ORAL | Status: DC | PRN

## 2019-02-03 MED ORDER — FENTANYL CITRATE (PF) 100 MCG/2ML IJ SOLN
25.0000 ug | INTRAMUSCULAR | Status: DC | PRN
  Administered 2019-02-03: 25 ug via INTRAVENOUS

## 2019-02-03 MED ORDER — PROMETHAZINE HCL 25 MG/ML IJ SOLN
INTRAMUSCULAR | Status: AC
  Filled 2019-02-03: qty 1

## 2019-02-03 MED ORDER — POVIDONE-IODINE 10 % EX SWAB: 2.0000 "application " | Freq: Once | CUTANEOUS | Status: DC

## 2019-02-03 MED ORDER — PROPOFOL 10 MG/ML IV BOLUS
INTRAVENOUS | Status: AC
  Filled 2019-02-03: qty 60

## 2019-02-03 MED ORDER — MOMETASONE FURO-FORMOTEROL FUM 200-5 MCG/ACT IN AERO
2.0000 | INHALATION_SPRAY | Freq: Two times a day (BID) | RESPIRATORY_TRACT | Status: DC
  Administered 2019-02-03 – 2019-02-07 (×7): 2 via RESPIRATORY_TRACT
  Filled 2019-02-03: qty 8.8

## 2019-02-03 MED ORDER — TRAZODONE HCL 100 MG PO TABS
100.0000 mg | ORAL_TABLET | Freq: Every evening | ORAL | Status: DC | PRN
  Administered 2019-02-05 – 2019-02-07 (×2): 100 mg via ORAL
  Filled 2019-02-03 (×2): qty 1

## 2019-02-03 MED ORDER — GLYCOPYRROLATE PF 0.2 MG/ML IJ SOSY
PREFILLED_SYRINGE | INTRAMUSCULAR | Status: AC
  Filled 2019-02-03: qty 1

## 2019-02-03 MED ORDER — PHENOL 1.4 % MT LIQD: 1.0000 | OROMUCOSAL | Status: DC | PRN

## 2019-02-03 MED ORDER — SODIUM CHLORIDE 0.9 % IV SOLN
INTRAVENOUS | Status: DC
  Administered 2019-02-03 – 2019-02-05 (×4): via INTRAVENOUS

## 2019-02-03 MED ORDER — PHENYLEPHRINE HCL 10 MG/ML IJ SOLN
INTRAMUSCULAR | Status: AC
  Filled 2019-02-03: qty 1

## 2019-02-03 MED ORDER — PROPOFOL 10 MG/ML IV BOLUS
INTRAVENOUS | Status: DC | PRN
  Administered 2019-02-03 (×2): 30 mg via INTRAVENOUS

## 2019-02-03 MED ORDER — PROPOFOL 10 MG/ML IV BOLUS
INTRAVENOUS | Status: AC
  Filled 2019-02-03: qty 40

## 2019-02-03 MED ORDER — CEFAZOLIN SODIUM-DEXTROSE 2-4 GM/100ML-% IV SOLN
2.0000 g | INTRAVENOUS | Status: AC
  Administered 2019-02-03: 2 g via INTRAVENOUS
  Filled 2019-02-03: qty 100

## 2019-02-03 MED ORDER — GLYCOPYRROLATE PF 0.2 MG/ML IJ SOSY
PREFILLED_SYRINGE | INTRAMUSCULAR | Status: DC | PRN
  Administered 2019-02-03: .2 mg via INTRAVENOUS

## 2019-02-03 MED ORDER — ISOPROPYL ALCOHOL 70 % SOLN
Status: DC | PRN
  Administered 2019-02-03: 1 via TOPICAL

## 2019-02-03 MED ORDER — MEPERIDINE HCL 50 MG/ML IJ SOLN: 6.2500 mg | INTRAMUSCULAR | Status: DC | PRN

## 2019-02-03 MED ORDER — VANCOMYCIN HCL IN DEXTROSE 1-5 GM/200ML-% IV SOLN
1000.0000 mg | Freq: Two times a day (BID) | INTRAVENOUS | Status: AC
  Administered 2019-02-03: 1000 mg via INTRAVENOUS
  Filled 2019-02-03: qty 200

## 2019-02-03 MED ORDER — KETOROLAC TROMETHAMINE 15 MG/ML IJ SOLN
7.5000 mg | Freq: Four times a day (QID) | INTRAMUSCULAR | Status: AC
  Administered 2019-02-03 – 2019-02-04 (×4): 7.5 mg via INTRAVENOUS
  Filled 2019-02-03 (×4): qty 1

## 2019-02-03 MED ORDER — ALUM & MAG HYDROXIDE-SIMETH 200-200-20 MG/5ML PO SUSP: 30.0000 mL | ORAL | Status: DC | PRN

## 2019-02-03 MED ORDER — DOCUSATE SODIUM 100 MG PO CAPS
100.0000 mg | ORAL_CAPSULE | Freq: Two times a day (BID) | ORAL | Status: DC
  Administered 2019-02-03 – 2019-02-07 (×8): 100 mg via ORAL
  Filled 2019-02-03 (×8): qty 1

## 2019-02-03 MED ORDER — ONDANSETRON HCL 4 MG PO TABS: 4.0000 mg | ORAL_TABLET | Freq: Four times a day (QID) | ORAL | Status: DC | PRN

## 2019-02-03 MED ORDER — MONTELUKAST SODIUM 10 MG PO TABS
10.0000 mg | ORAL_TABLET | Freq: Every evening | ORAL | Status: DC
  Administered 2019-02-03 – 2019-02-06 (×4): 10 mg via ORAL
  Filled 2019-02-03 (×4): qty 1

## 2019-02-03 MED ORDER — PANTOPRAZOLE SODIUM 40 MG PO TBEC
80.0000 mg | DELAYED_RELEASE_TABLET | Freq: Every day | ORAL | Status: DC
  Administered 2019-02-04 – 2019-02-07 (×4): 80 mg via ORAL
  Filled 2019-02-03 (×4): qty 2

## 2019-02-03 MED ORDER — CHLORHEXIDINE GLUCONATE 4 % EX LIQD: 60.0000 mL | Freq: Once | CUTANEOUS | Status: DC

## 2019-02-03 MED ORDER — PROPOFOL 500 MG/50ML IV EMUL
INTRAVENOUS | Status: DC | PRN
  Administered 2019-02-03: 50 ug/kg/min via INTRAVENOUS

## 2019-02-03 MED ORDER — PRAVASTATIN SODIUM 20 MG PO TABS
40.0000 mg | ORAL_TABLET | Freq: Every evening | ORAL | Status: DC
  Administered 2019-02-03 – 2019-02-06 (×4): 40 mg via ORAL
  Filled 2019-02-03 (×4): qty 2

## 2019-02-03 MED ORDER — ONDANSETRON HCL 4 MG/2ML IJ SOLN
INTRAMUSCULAR | Status: DC | PRN
  Administered 2019-02-03: 4 mg via INTRAVENOUS

## 2019-02-03 MED ORDER — 0.9 % SODIUM CHLORIDE (POUR BTL) OPTIME
TOPICAL | Status: DC | PRN
  Administered 2019-02-03: 1000 mL

## 2019-02-03 MED ORDER — KETOROLAC TROMETHAMINE 30 MG/ML IJ SOLN
INTRAMUSCULAR | Status: DC | PRN
  Administered 2019-02-03: 30 mg

## 2019-02-03 MED ORDER — BUPIVACAINE-EPINEPHRINE 0.25% -1:200000 IJ SOLN
INTRAMUSCULAR | Status: DC | PRN
  Administered 2019-02-03: 30 mL

## 2019-02-03 MED ORDER — METHOCARBAMOL 500 MG PO TABS
500.0000 mg | ORAL_TABLET | Freq: Four times a day (QID) | ORAL | Status: DC | PRN
  Administered 2019-02-03 – 2019-02-06 (×6): 500 mg via ORAL
  Filled 2019-02-03 (×6): qty 1

## 2019-02-03 MED ORDER — KETOROLAC TROMETHAMINE 30 MG/ML IJ SOLN
INTRAMUSCULAR | Status: AC
  Filled 2019-02-03: qty 1

## 2019-02-03 SURGICAL SUPPLY — 102 items
ADAPTER FEM OFFST VANGUARD 2.5 (Knees) ×1 IMPLANT
ADH SKN CLS APL DERMABOND .7 (GAUZE/BANDAGES/DRESSINGS) ×2
ADPR FEM 2.5XOFST KN REV SYS (Knees) ×1 IMPLANT
AUG BMT 360 TIB SM CRUC WING (Joint) ×2 IMPLANT
AUG FEM 65X5 REV KN LM/RL (Orthopedic Implant) ×1 IMPLANT
AUG FEM DISTAL 5X65 RL/LM (Orthopedic Implant) ×2 IMPLANT
AUG TIB 67X15 REV KN LT LM/RL (Miscellaneous) ×1 IMPLANT
AUG TIB SM REV CRCTE WNG KN (Joint) ×1 IMPLANT
AUG TIB UNV 67X10 REV KN (Joint) ×1 IMPLANT
AUGMENT BMT 360 TIB SM CRC WNG (Joint) IMPLANT
AUGMENT FEM DISTAL 5X65 RL/LM (Orthopedic Implant) IMPLANT
AUGMENT KNEE TIBIA W BOLT 76 (Miscellaneous) IMPLANT
BAG DECANTER FOR FLEXI CONT (MISCELLANEOUS) ×2 IMPLANT
BAG SPEC THK2 15X12 ZIP CLS (MISCELLANEOUS)
BAG ZIPLOCK 12X15 (MISCELLANEOUS) IMPLANT
BANDAGE ACE 4X5 VEL STRL LF (GAUZE/BANDAGES/DRESSINGS) ×1 IMPLANT
BANDAGE ACE 6X5 VEL STRL LF (GAUZE/BANDAGES/DRESSINGS) ×2 IMPLANT
BEARING PS TIBIAL KN (Knees) IMPLANT
BIT DRILL 2.9 CANN QC NONSTRL (BIT) ×1 IMPLANT
BLADE SAG 18X100X1.27 (BLADE) ×1 IMPLANT
BLADE SAW RECIP 87.9 MT (BLADE) ×1 IMPLANT
BLADE SAW SGTL 13.0X1.19X90.0M (BLADE) ×1 IMPLANT
BLADE SAW SGTL 81X20 HD (BLADE) ×2 IMPLANT
BLADE SURG SZ10 CARB STEEL (BLADE) ×4 IMPLANT
BNDG CMPR MED 10X6 ELC LF (GAUZE/BANDAGES/DRESSINGS) ×1
BNDG ELASTIC 6X10 VLCR STRL LF (GAUZE/BANDAGES/DRESSINGS) ×1 IMPLANT
BRNG TIB 63/67X14 POST (Knees) ×1 IMPLANT
BRUSH FEMORAL CANAL (MISCELLANEOUS) IMPLANT
CEMENT BONE REFOBACIN R1X40 US (Cement) ×3 IMPLANT
CHLORAPREP W/TINT 26ML (MISCELLANEOUS) ×2 IMPLANT
COMPONENT FEM 65 LT W/SCRW (Miscellaneous) ×1 IMPLANT
CONS PS TIBIAL BEAR KNEE (Knees) ×2 IMPLANT
COVER SURGICAL LIGHT HANDLE (MISCELLANEOUS) ×2 IMPLANT
COVER WAND RF STERILE (DRAPES) IMPLANT
CUFF TOURN SGL QUICK 34 (TOURNIQUET CUFF) ×2
CUFF TRNQT CYL 34X4.125X (TOURNIQUET CUFF) ×1 IMPLANT
DECANTER SPIKE VIAL GLASS SM (MISCELLANEOUS) ×2 IMPLANT
DERMABOND ADVANCED (GAUZE/BANDAGES/DRESSINGS) ×2
DERMABOND ADVANCED .7 DNX12 (GAUZE/BANDAGES/DRESSINGS) ×2 IMPLANT
DRAPE POUCH INSTRU U-SHP 10X18 (DRAPES) ×2 IMPLANT
DRAPE SHEET LG 3/4 BI-LAMINATE (DRAPES) ×6 IMPLANT
DRAPE U-SHAPE 47X51 STRL (DRAPES) ×2 IMPLANT
DRSG AQUACEL AG ADV 3.5X10 (GAUZE/BANDAGES/DRESSINGS) ×1 IMPLANT
DRSG AQUACEL AG ADV 3.5X14 (GAUZE/BANDAGES/DRESSINGS) ×1 IMPLANT
DRSG TEGADERM 4X4.75 (GAUZE/BANDAGES/DRESSINGS) IMPLANT
ELECT BLADE TIP CTD 4 INCH (ELECTRODE) ×2 IMPLANT
ELECT PENCIL ROCKER SW 15FT (MISCELLANEOUS) ×2 IMPLANT
ELECT REM PT RETURN 15FT ADLT (MISCELLANEOUS) ×2 IMPLANT
EVACUATOR 1/8 PVC DRAIN (DRAIN) ×2 IMPLANT
GAUZE SPONGE 4X4 12PLY STRL (GAUZE/BANDAGES/DRESSINGS) IMPLANT
GLOVE BIO SURGEON STRL SZ8.5 (GLOVE) ×6 IMPLANT
GLOVE BIOGEL PI IND STRL 7.0 (GLOVE) ×1 IMPLANT
GLOVE BIOGEL PI IND STRL 7.5 (GLOVE) ×1 IMPLANT
GLOVE BIOGEL PI IND STRL 8.5 (GLOVE) ×1 IMPLANT
GLOVE BIOGEL PI INDICATOR 7.0 (GLOVE) ×1
GLOVE BIOGEL PI INDICATOR 7.5 (GLOVE) ×1
GLOVE BIOGEL PI INDICATOR 8.5 (GLOVE) ×1
GLOVE ECLIPSE 8.0 STRL XLNG CF (GLOVE) ×2 IMPLANT
GOWN SPEC L3 XXLG W/TWL (GOWN DISPOSABLE) ×2 IMPLANT
GOWN STRL REUS W/TWL XL LVL3 (GOWN DISPOSABLE) ×2 IMPLANT
HANDPIECE INTERPULSE COAX TIP (DISPOSABLE) ×2
HOLDER FOLEY CATH W/STRAP (MISCELLANEOUS) ×1 IMPLANT
HOOD PEEL AWAY FLYTE STAYCOOL (MISCELLANEOUS) ×4 IMPLANT
JET LAVAGE IRRISEPT WOUND (IRRIGATION / IRRIGATOR) ×2
KNEE AUG TIBIAL 67 10 (Joint) ×1 IMPLANT
LAVAGE JET IRRISEPT WOUND (IRRIGATION / IRRIGATOR) IMPLANT
MANIFOLD NEPTUNE II (INSTRUMENTS) ×2 IMPLANT
MARKER SKIN DUAL TIP RULER LAB (MISCELLANEOUS) ×2 IMPLANT
NDL SPNL 18GX3.5 QUINCKE PK (NEEDLE) ×1 IMPLANT
NEEDLE SPNL 18GX3.5 QUINCKE PK (NEEDLE) ×2 IMPLANT
NS IRRIG 1000ML POUR BTL (IV SOLUTION) ×2 IMPLANT
OSTEOTOME THIN 6.0 3 (INSTRUMENTS) ×1 IMPLANT
PADDING CAST COTTON 6X4 STRL (CAST SUPPLIES) ×1 IMPLANT
PROTECTOR NERVE ULNAR (MISCELLANEOUS) ×2 IMPLANT
SEALER BIPOLAR AQUA 6.0 (INSTRUMENTS) ×2 IMPLANT
SET HNDPC FAN SPRY TIP SCT (DISPOSABLE) ×1 IMPLANT
SET PAD KNEE POSITIONER (MISCELLANEOUS) ×2 IMPLANT
SPONGE DRAIN TRACH 4X4 STRL 2S (GAUZE/BANDAGES/DRESSINGS) ×2 IMPLANT
SPONGE LAP 18X18 RF (DISPOSABLE) IMPLANT
STEM SPLINED KNEE 14X80MM (Joint) ×1 IMPLANT
STEM SPLINED KNEE 18MMX120MM (Joint) ×1 IMPLANT
SUT MNCRL AB 3-0 PS2 18 (SUTURE) ×2 IMPLANT
SUT MNCRL AB 4-0 PS2 18 (SUTURE) ×2 IMPLANT
SUT MON AB 2-0 CT1 36 (SUTURE) ×2 IMPLANT
SUT STRATAFIX PDO 1 14 VIOLET (SUTURE) ×2
SUT STRATFX PDO 1 14 VIOLET (SUTURE) ×1
SUT VIC AB 1 CT1 27 (SUTURE) ×2
SUT VIC AB 1 CT1 27XBRD ANTBC (SUTURE) IMPLANT
SUT VIC AB 1 CTX 36 (SUTURE) ×4
SUT VIC AB 1 CTX36XBRD ANBCTR (SUTURE) ×2 IMPLANT
SUT VIC AB 2-0 CT1 27 (SUTURE) ×6
SUT VIC AB 2-0 CT1 TAPERPNT 27 (SUTURE) ×1 IMPLANT
SUTURE STRATFX PDO 1 14 VIOLET (SUTURE) ×1 IMPLANT
SYR 50ML LL SCALE MARK (SYRINGE) IMPLANT
Saw blade ×1 IMPLANT
TIBIA KNEE AUGMNT W BOLT 67 (Miscellaneous) ×2 IMPLANT
TOWER CARTRIDGE SMART MIX (DISPOSABLE) ×4 IMPLANT
TRAY FOLEY MTR SLVR 16FR STAT (SET/KITS/TRAYS/PACK) ×2 IMPLANT
TRAY TIBIAL W/TI LOCK SZ 67 (Knees) ×1 IMPLANT
WATER STERILE IRR 1000ML POUR (IV SOLUTION) ×2 IMPLANT
WRAP KNEE MAXI GEL POST OP (GAUZE/BANDAGES/DRESSINGS) ×2 IMPLANT
YANKAUER SUCT BULB TIP 10FT TU (MISCELLANEOUS) ×2 IMPLANT

## 2019-02-03 NOTE — Anesthesia Preprocedure Evaluation (Signed)
Anesthesia Evaluation  Patient identified by MRN, date of birth, ID band Patient awake    Reviewed: Allergy & Precautions, NPO status , Patient's Chart, lab work & pertinent test results  Airway Mallampati: II  TM Distance: >3 FB Neck ROM: Full    Dental  (+) Dental Advisory Given   Pulmonary asthma , COPD, former smoker,    Pulmonary exam normal breath sounds clear to auscultation       Cardiovascular hypertension, + CAD  Normal cardiovascular exam+ Valvular Problems/Murmurs  Rhythm:Regular Rate:Normal  4/19 - Left ventricle: The cavity size was normal. Wall thickness was increased in a pattern of mild LVH. Systolic function was normal. The estimated ejection fraction was in the range of 55% to 60%. Wall motion was normal; there were no regional wall motion abnormalities. Doppler parameters are consistent with abnormal left ventricular relaxation (grade 1 diastolic dysfunction). - Aortic valve: Mildly calcified annulus. Trileaflet. - Mitral valve: Mildly thickened leaflets . - Right ventricle: The cavity size was mildly dilated. Wall   thickness was normal. Systolic function was low normal. - Right atrium: The atrium was mildly dilated. - Tricuspid valve: There was mild regurgitation. - Pulmonary arteries: PA peak pressure: 36 mm Hg (S).   Neuro/Psych PSYCHIATRIC DISORDERS Anxiety negative neurological ROS     GI/Hepatic Neg liver ROS, GERD  ,  Endo/Other  negative endocrine ROS  Renal/GU negative Renal ROS     Musculoskeletal  (+) Arthritis ,   Abdominal   Peds  Hematology negative hematology ROS (+)   Anesthesia Other Findings   Reproductive/Obstetrics negative OB ROS                             Anesthesia Physical Anesthesia Plan  ASA: III  Anesthesia Plan: Spinal   Post-op Pain Management:    Induction:   PONV Risk Score and Plan: 3 and Ondansetron, Dexamethasone, Propofol  infusion and Treatment may vary due to age or medical condition  Airway Management Planned: Natural Airway  Additional Equipment: None  Intra-op Plan:   Post-operative Plan:   Informed Consent: I have reviewed the patients History and Physical, chart, labs and discussed the procedure including the risks, benefits and alternatives for the proposed anesthesia with the patient or authorized representative who has indicated his/her understanding and acceptance.     Dental advisory given  Plan Discussed with: CRNA  Anesthesia Plan Comments:         Anesthesia Quick Evaluation

## 2019-02-03 NOTE — Interval H&P Note (Signed)
History and Physical Interval Note:  02/03/2019 9:16 AM  [name]  has presented today for surgery, with the diagnosis of Failed left total knee arthroplasty  The various methods of treatment have been discussed with the patient and family. After consideration of risks, benefits and other options for treatment, the patient has consented to  Procedure(s): TOTAL KNEE REVISION (Left) as a surgical intervention .  The patient's history has been reviewed, patient examined, no change in status, stable for surgery.  I have reviewed the patient's chart and labs.  Questions were answered to the patient's satisfaction.     [name]

## 2019-02-03 NOTE — Discharge Instructions (Signed)
Dr. Rod Can Total Joint Specialist [facility] [address] [place], [place] [address] [phone]  TOTAL KNEE REPLACEMENT POSTOPERATIVE DIRECTIONS    Knee Rehabilitation, Guidelines Following Surgery  Results after knee surgery are often greatly improved when you follow the exercise, range of motion and muscle strengthening exercises prescribed by your doctor. Safety measures are also important to protect the knee from further injury. Any time any of these exercises cause you to have increased pain or swelling in your knee joint, decrease the amount until you are comfortable again and slowly increase them. If you have problems or questions, call your caregiver or physical therapist for advice.   WEIGHT BEARING Other:  touch down weight bearing left leg  HOME CARE INSTRUCTIONS  Remove items at home which could result in a fall. This includes throw rugs or furniture in walking pathways.  Continue medications as instructed at time of discharge. You may have some home medications which will be placed on hold until you complete the course of blood thinner medication.  You may start showering once you are discharged home but do not submerge the incision under water. Just pat the incision dry and apply a dry gauze dressing on daily. Walk with walker as instructed.  You may resume a sexual relationship in one month or when given the OK by your doctor.   Use walker as long as suggested by your caregivers.  Avoid periods of inactivity such as sitting longer than an hour when not asleep. This helps prevent blood clots.  You may put full weight on your legs and walk as much as is comfortable.  You may return to work once you are cleared by your doctor.  Do not drive a car for 6 weeks or until released by you surgeon.   Do not drive while taking narcotics.  Wear the elastic stockings for three weeks following surgery during the day but you may remove then at  night. Make sure you keep all of your appointments after your operation with all of your doctors and caregivers. You should call the office at the above phone number and make an appointment for approximately two weeks after the date of your surgery. Do not remove your surgical dressing. The dressing is waterproof; you may take showers in 3 days, but do not take tub baths or submerge the dressing. Please pick up a stool softener and laxative for home use as long as you are requiring pain medications.  ICE to the affected knee every three hours for 30 minutes at a time and then as needed for pain and swelling.  Continue to use ice on the knee for pain and swelling from surgery. You may notice swelling that will progress down to the foot and ankle.  This is normal after surgery.  Elevate the leg when you are not up walking on it.   It is important for you to complete the blood thinner medication as prescribed by your doctor.  Continue to use the breathing machine which will help keep your temperature down.  It is common for your temperature to cycle up and down following surgery, especially at night when you are not up moving around and exerting yourself.  The breathing machine keeps your lungs expanded and your temperature down.  RANGE OF MOTION AND STRENGTHENING EXERCISES  Rehabilitation of the knee is important following a knee injury or an operation. After just a few days of immobilization, the muscles of the thigh which control  the knee become weakened and shrink (atrophy). Knee exercises are designed to build up the tone and strength of the thigh muscles and to improve knee motion. Often times heat used for twenty to thirty minutes before working out will loosen up your tissues and help with improving the range of motion but do not use heat for the first two weeks following surgery. These exercises can be done on a training (exercise) mat, on the floor, on a table or on a bed. Use what ever works the  best and is most comfortable for you Knee exercises include:  Leg Lifts - While your knee is still immobilized in a splint or cast, you can do straight leg raises. Lift the leg to 60 degrees, hold for 3 sec, and slowly lower the leg. Repeat 10-20 times 2-3 times daily. Perform this exercise against resistance later as your knee gets better.  Quad and Hamstring Sets - Tighten up the muscle on the front of the thigh (Quad) and hold for 5-10 sec. Repeat this 10-20 times hourly. Hamstring sets are done by pushing the foot backward against an object and holding for 5-10 sec. Repeat as with quad sets.  A rehabilitation program following serious knee injuries can speed recovery and prevent re-injury in the future due to weakened muscles. Contact your doctor or a physical therapist for more information on knee rehabilitation.   SKILLED REHAB INSTRUCTIONS: If the patient is transferred to a skilled rehab facility following release from the hospital, a list of the current medications will be sent to the facility for the patient to continue.  When discharged from the skilled rehab facility, please have the facility set up the patient's [place] prior to being released. Also, the skilled facility will be responsible for providing the patient with their medications at time of release from the facility to include their pain medication, the muscle relaxants, and their blood thinner medication. If the patient is still at the rehab facility at time of the two week follow up appointment, the skilled rehab facility will also need to assist the patient in arranging follow up appointment in our office and any transportation needs.  MAKE SURE YOU:  Understand these instructions.  Will watch your condition.  Will get help right away if you are not doing well or get worse.    Pick up stool softner and laxative for home use following surgery while on pain medications. Do NOT remove your dressing. You may  shower.  Do not take tub baths or submerge incision under water. May shower starting three days after surgery. Please use a clean towel to pat the incision dry following showers. Continue to use ice for pain and swelling after surgery. Do not use any lotions or creams on the incision until instructed by your surgeon.  Information on my medicine - ELIQUIS (apixaban)  Why was Eliquis prescribed for you? Eliquis was prescribed for you to reduce the risk of blood clots forming after orthopedic surgery.    What do You need to know about Eliquis? Take your Eliquis TWICE DAILY - one tablet in the morning and one tablet in the evening with or without food.  It would be best to take the dose about the same time each day.  If you have difficulty swallowing the tablet whole please discuss with your pharmacist how to take the medication safely.  Take Eliquis exactly as prescribed by your doctor and DO NOT stop taking Eliquis without talking to the doctor  who prescribed the medication.  Stopping without other medication to take the place of Eliquis may increase your risk of developing a clot.  After discharge, you should have regular check-up appointments with your healthcare provider that is prescribing your Eliquis.  What do you do if you miss a dose? If a dose of ELIQUIS is not taken at the scheduled time, take it as soon as possible on the same day and twice-daily administration should be resumed.  The dose should not be doubled to make up for a missed dose.  Do not take more than one tablet of ELIQUIS at the same time.  Important Safety Information A possible side effect of Eliquis is bleeding. You should call your healthcare provider right away if you experience any of the following: ? Bleeding from an injury or your nose that does not stop. ? Unusual colored urine (red or dark brown) or unusual colored stools (red or black). ? Unusual bruising for unknown reasons. ? A serious fall  or if you hit your head (even if there is no bleeding).  Some medicines may interact with Eliquis and might increase your risk of bleeding or clotting while on Eliquis. To help avoid this, consult your healthcare provider or pharmacist prior to using any new prescription or non-prescription medications, including herbals, vitamins, non-steroidal anti-inflammatory drugs (NSAIDs) and supplements.  This website has more information on Eliquis (apixaban): http://www.eliquis.com/eliquis/home

## 2019-02-03 NOTE — Progress Notes (Signed)
PT Note  Patient Details Name: [name] MRN: [id number] DOB: 19-Apr-1941     Check chart and pt still asleep from surgery  Just recently arrived on unit. Pt is PWB 30% and may need ST-SNF per family member in room depending on pt progress. We will begin evaluation tomorrow and continue to assess her progress and DC recommendation .    Clide Dales 02/03/2019, 6:19 PM  Clide Dales, PT Acute Rehabilitation Services Pager: [phone] Office: [phone] 02/03/2019

## 2019-02-03 NOTE — Progress Notes (Signed)
AssistedDr. Lissa Hoard with left, ultrasound guided, adductor canal block. Side rails up, monitors on throughout procedure. See vital signs in flow sheet. Tolerated Procedure well.

## 2019-02-03 NOTE — Anesthesia Procedure Notes (Signed)
Anesthesia Regional Block: Adductor canal block   Pre-Anesthetic Checklist: ,, timeout performed, Correct Patient, Correct Site, Correct Laterality, Correct Procedure, Correct Position, site marked, Risks and benefits discussed,  Surgical consent,  Pre-op evaluation,  At surgeon's request and post-op pain management  Laterality: Left  Prep: chloraprep       Needles:  Injection technique: Single-shot  Needle Type: Stimiplex     Needle Length: 9cm  Needle Gauge: 21     Additional Needles:   Procedures:,,,, ultrasound used (permanent image in chart),,,,  Narrative:  Start time: 02/03/2019 8:50 AM End time: 02/03/2019 8:58 AM Injection made incrementally with aspirations every 5 mL.  Performed by: Personally  Anesthesiologist: Nolon Nations, MD  Additional Notes: BP cuff, EKG monitors applied. Sedation begun. Artery and nerve location verified with U/S and anesthetic injected incrementally, slowly, and after negative aspirations under direct u/s guidance. Good fascial /perineural spread. Tolerated well.

## 2019-02-03 NOTE — Transfer of Care (Signed)
Immediate Anesthesia Transfer of Care Note  Patient: [name]  Procedure(s) Performed: LEFT TOTAL KNEE REVISION (Left Knee)  Patient Location: PACU  Anesthesia Type:Spinal  Level of Consciousness: awake, alert  and oriented  Airway & Oxygen Therapy: Patient Spontanous Breathing and Patient connected to face mask oxygen  Post-op Assessment: Report given to RN and Post -op Vital signs reviewed and stable  Post vital signs: Reviewed and stable  Last Vitals:  Vitals Value Taken Time  BP 102/62 02/03/2019  3:31 PM  Temp    Pulse 50 02/03/2019  3:33 PM  Resp 12 02/03/2019  3:33 PM  SpO2 100 % 02/03/2019  3:33 PM  Vitals shown include unvalidated device data.  Last Pain:  Vitals:   02/03/19 0716  TempSrc:   PainSc: 4       Patients Stated Pain Goal: 4 (38/25/05 3976)  Complications: No apparent anesthesia complications

## 2019-02-03 NOTE — Anesthesia Postprocedure Evaluation (Signed)
Anesthesia Post Note  Patient: [name]  Procedure(s) Performed: LEFT TOTAL KNEE REVISION (Left Knee)     Patient location during evaluation: PACU Anesthesia Type: Spinal Level of consciousness: oriented and awake and alert Pain management: pain level controlled Vital Signs Assessment: post-procedure vital signs reviewed and stable Respiratory status: spontaneous breathing, respiratory function stable, patient connected to nasal cannula oxygen and nonlabored ventilation Cardiovascular status: blood pressure returned to baseline and stable Postop Assessment: no headache, no backache, no apparent nausea or vomiting, spinal receding and patient able to bend at knees Anesthetic complications: no    Last Vitals:  Vitals:   02/03/19 1854 02/03/19 1951  BP: (!) 145/80 132/77  Pulse: (!) 43 (!) 42  Resp: 16 17  Temp: 36.7 C 36.7 C  SpO2: 100% 100%    Last Pain:  Vitals:   02/03/19 1951  TempSrc: Oral  PainSc:                  Kiyah Demartini A.

## 2019-02-03 NOTE — Op Note (Signed)
OPERATIVE REPORT   02/03/2019  3:05 PM  PATIENT:  [name]   SURGEON:  Bertram Savin, MD  ASSISTANT:  Nehemiah Massed, PA-C.   PREOPERATIVE DIAGNOSIS:  Failed left total knee arthroplasty  POSTOPERATIVE DIAGNOSIS:  Same.  PROCEDURE: Revision left total knee arthroplasty, femoral and tibial components.  ANESTHESIA:   GETA.  ANTIBIOTICS: 1 g vancomycin.  EXPLANTS: 1. DePuy Sigma size 4 femur. 2.  RP tibial tray size 3. 3.  18 mm RP PS poly.  IMPLANTS: 1. Biomet Vanguard 360 femoral component size 65 mm with 5 mm distal medial augment and 18 x 120 mm splined stem. 2.  Biomet 360 tibial tray size 67 mm with 10 mm lateral augment, 15 mm medial augment, size small cruciate wing, 2.5 mm offset adapter, and 14 x 80 mm splined stem. 3.  Constrained PS tibial bearing size 14 mm.  SPECIMENS: Left knee synovial tissue culture.  COMPLICATIONS: None.  DISPOSITION: Stable to PACU.  SURGICAL INDICATIONS:  [name] is a 78 y.o. 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  X-rays show loosening of the tibia and femur as well as fragmentation of the medial proximal tibia.  Work-up for infection was negative.  The risks, benefits, and alternatives were discussed with the patient preoperatively including but not limited to the risks of infection, bleeding, nerve / blood vessel injury, stiffness, fracture of bone, cardiopulmonary complications, the need for repeat surgery, among others, and the patient was willing to proceed.  PROCEDURE IN DETAIL: The patient was identified in the holding area using 2 identifiers.  The surgical site was marked by myself.  She was taken the operating room, placed supine on the operating room table.  General anesthesia was induced.  Foley catheter was placed.  The left lower extremity was prepped and draped in the normal sterile surgical fashion.  Timeout was called, verifying site and site of surgery.   She did receive IV antibiotics within 60 minutes of beginning the procedure.  I used a #10 blade to sharply excise her previous anterior incision.  Full-thickness skin flaps were created.  Irrisept solution was used throughout the case.  I made a standard medial parapatellar arthrotomy.  She had normal straw-colored joint fluid.  I performed a medial release.  The knee was brought into full extension.  I performed a radical synovectomy of the medial gutter, lateral gutter, and suprapatellar pouch.  She had metallic staining to the synovium.  A representative sample of synovium was sent for tissue culture.  I sharply excised the infrapatellar scar using Bovie electrocautery.  I examined the patellar component, which was stable.  The tibial component was grossly loose, as was the femur.  Using an osteotome, I amputated the RP peg of the polyliner.  The polyliner was then removed.  The femur was able to be removed by hand.  The implant debonded from the cement.  I then further flexed up the knee.  The tibial component was removed by hand as well.  Again, the component was debonded from the cement.  I used osteotome and entry drill to gain entry to the medullary canal of the femur and tibia.  I sequentially reamed up to a 14 mm reamer on the tibia and 18 mm reamer on the femur.  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  There was a large uncontained defect to the proximal  medial tibia.  Using an intramedullary cutting guide, I freshened the proximal tibia cut.  I sized the proximal tibia to a 67 mm implant.  It was obvious that a medial augment would be required.  Cutting guide was used to cut for a 5 mm medial augment.  2.5 mm offset was planned.  The trial tibial component was assembled on the back table and impacted into place.  There was good fit.  I then turned my attention to the femur.  I sized the femur to a 65 mm component.  I cut  through the trial for a 5 mm distal/medial augment.  Box cut was made.  The trial femoral component was assembled on the back table and impacted into place.  At this point I upsized the tibial augments to 10 mm medially and 15 mm laterally to reduce the flexion and extension gaps.  The trial poly-was inserted.  There was excellent varus/valgus stability throughout a range of motion.  The flexion and extension gaps were well-balanced.  The trial implants were removed.  Esmarch was used to exsanguinate the extremity, the tourniquet was elevated to 300 mmHg.  Final proximal tibial preparation was performed.  The cut bony surfaces were irrigated.  The real components were opened and assembled on the back table.  I sequentially cemented the tibial and then the femoral component using a hybrid fixation technique.  Trial poly-liner was placed and the knee was brought into extension while the cement polymerized.  Excess cement was cleared.  Once the cement was fully hardened, I exchange the trial liner for a real polyliner.  Locking bar was placed.  Repeat stability testing was performed and unchanged.  The tourniquet was then let down.  Total tourniquet time was 25 minutes.  The knee was copiously irrigated with normal saline using pulse lavage.  I closed the arthrotomy over a medium Hemovac drain using #1 Vicryl and strata fix.  Deep fatty tissue was closed with 2-0 Vicryl suture.  Deep dermal layer was closed with 2-0 Monocryl.  Subcuticular closure was performed with 3-0 Monocryl.  Dermabond was applied.  Once the glue was fully hardened, an Aquacel dressing was applied.  Bulky compressive dressing was then applied.  Patient was extubated, taken to the PACU in stable condition.  Sponge, needle, and instrument counts were correct at the end of the case x2.  There were no known complications.  POSTOPERATIVE PLAN: Postoperatively, the patient will be admitted to the hospital.  Touchdown weightbearing left lower  extremity due to poor bone quality.  Physical therapy for mobilization and range of motion to the left knee.  Begin Eliquis for DVT prophylaxis.  Hemovac drain to be removed when output is appropriate.  We will plan for discharge home after she is mobilizing well with therapy.  Return to the office 2 weeks after discharge for routine postoperative care.

## 2019-02-03 NOTE — Anesthesia Procedure Notes (Signed)
Spinal  Patient location during procedure: OR Start time: 02/03/2019 10:05 AM End time: 02/03/2019 10:09 AM Staffing Anesthesiologist: Nolon Nations, MD Resident/CRNA: Montel Clock, CRNA Performed: resident/CRNA  Preanesthetic Checklist Completed: patient identified, surgical consent, pre-op evaluation, timeout performed, IV checked, risks and benefits discussed and monitors and equipment checked Spinal Block Patient position: sitting Prep: DuraPrep Patient monitoring: heart rate, cardiac monitor, continuous pulse ox and blood pressure Approach: midline Location: L3-4 Injection technique: single-shot Needle Needle type: Sprotte  Needle gauge: 24 G Needle length: 9 cm Needle insertion depth: 7.5 cm Assessment Sensory level: T4

## 2019-02-04 LAB — CBC
HCT: 28.7 % — ABNORMAL LOW (ref 36.0–46.0)
Hemoglobin: 8.2 g/dL — ABNORMAL LOW (ref 12.0–15.0)
MCH: 26.7 pg (ref 26.0–34.0)
MCHC: 28.6 g/dL — ABNORMAL LOW (ref 30.0–36.0)
MCV: 93.5 fL (ref 80.0–100.0)
Platelets: 151 10*3/uL (ref 150–400)
RBC: 3.07 MIL/uL — ABNORMAL LOW (ref 3.87–5.11)
RDW: 13.2 % (ref 11.5–15.5)
WBC: 5.4 10*3/uL (ref 4.0–10.5)
nRBC: 0 % (ref 0.0–0.2)

## 2019-02-04 LAB — BASIC METABOLIC PANEL
Anion gap: 3 — ABNORMAL LOW (ref 5–15)
BUN: 12 mg/dL (ref 8–23)
CO2: 26 mmol/L (ref 22–32)
Calcium: 7.6 mg/dL — ABNORMAL LOW (ref 8.9–10.3)
Chloride: 109 mmol/L (ref 98–111)
Creatinine, Ser: 0.61 mg/dL (ref 0.44–1.00)
GFR calc Af Amer: >60 mL/min (ref >60)
GFR calc non Af Amer: >60 mL/min (ref >60)
Glucose, Bld: 117 mg/dL — ABNORMAL HIGH (ref 70–99)
Potassium: 3.9 mmol/L (ref 3.5–5.1)
Sodium: 138 mmol/L (ref 135–145)

## 2019-02-04 MED ORDER — HYDROCODONE-ACETAMINOPHEN 5-325 MG PO TABS: 1.0000 | ORAL_TABLET | ORAL | 0 refills | Status: DC | PRN

## 2019-02-04 MED ORDER — APIXABAN 2.5 MG PO TABS: 2.5000 mg | ORAL_TABLET | Freq: Two times a day (BID) | ORAL | 0 refills | Status: DC

## 2019-02-04 MED ORDER — ONDANSETRON HCL 4 MG PO TABS: 4.0000 mg | ORAL_TABLET | Freq: Four times a day (QID) | ORAL | 0 refills | Status: DC | PRN

## 2019-02-04 MED ORDER — FLUTICASONE PROPIONATE 50 MCG/ACT NA SUSP: 1.0000 | Freq: Two times a day (BID) | NASAL | Status: DC | PRN

## 2019-02-04 MED ORDER — SENNA 8.6 MG PO TABS: 2.0000 | ORAL_TABLET | Freq: Every day | ORAL | 1 refills | Status: DC

## 2019-02-04 MED ORDER — DOCUSATE SODIUM 100 MG PO CAPS: 100.0000 mg | ORAL_CAPSULE | Freq: Two times a day (BID) | ORAL | 1 refills | Status: DC

## 2019-02-04 NOTE — Discharge Summary (Signed)
Physician Discharge Summary  Patient ID: [name] MRN: [id number] DOB/AGE: [birth year] 76 y.o.  Admit date: 02/03/2019 Discharge date: 02/07/2019  Admission Diagnoses:  Failed total knee, left [facility])  Discharge Diagnoses:  Principal Problem:   Failed total knee, left ([place]) Active Problems:   Failed total knee, left, initial encounter [facility])   Past Medical History:  Diagnosis Date  . Allergic rhinitis   . Arnold-Chiari malformation ([place])   . Asthma   . CAD (coronary artery disease)   . COPD (chronic obstructive pulmonary disease) ([place])   . Coughing    coughing since last 01-22-2019 , started on cefdinir bid x10 days, has 3 pills left today , reports she feels mcuh better , still coughing copiuus amonts of thick white sputum , denies fever nor chills, nor body aches .   Marland Kitchen DJD (degenerative joint disease)   . GERD (gastroesophageal reflux disease)   . History of absence seizures    last confirmed seizure age 18    . Hx of colonic polyps   . Hypercholesteremia   . Hypertension   . Obesity   . Positive PPD   . Reflex sympathetic dystrophy   . Varicose veins   . Venous insufficiency     Surgeries: Procedure(s): LEFT TOTAL KNEE REVISION on 02/03/2019   Consultants (if any):   Discharged Condition: Improved  Hospital Course: [name] is an 78 y.o. female who was admitted 02/03/2019 with a diagnosis of Failed total knee, left ([place]) and went to the operating room on 02/03/2019 and underwent the above named procedures.    She was given perioperative antibiotics:  Anti-infectives (From admission, onward)   Start     Dose/Rate Route Frequency Ordered Stop   02/03/19 2000  vancomycin (VANCOCIN) IVPB 1000 mg/200 mL premix     1,000 mg 200 mL/hr over 60 Minutes Intravenous Every 12 hours 02/03/19 1752 02/03/19 2059   02/03/19 0700  ceFAZolin (ANCEF) IVPB 2g/100 mL premix     2 g 200 mL/hr over 30 Minutes Intravenous On call to O.R. 02/03/19 7858 02/03/19 1015   02/03/19 0600   vancomycin (VANCOCIN) 1,500 mg in sodium chloride 0.9 % 500 mL IVPB     1,500 mg 250 mL/hr over 120 Minutes Intravenous On call to O.R. 02/02/19 1210 02/03/19 1055    .  She was made Touch down weight bearing left leg. She worked with physical therapy.   She was given sequential compression devices, early ambulation, and apixaban for DVT prophylaxis.  Sh required 2 units PRBCs for acute blood loss anemia with hgb 6.3, had appropriate response to 8.3  She benefited maximally from the hospital stay and there were no complications.    Recent vital signs:  Vitals:   02/06/19 2038 02/07/19 0700  BP:  119/74  Pulse:  60  Resp:  16  Temp:  97.8 F (36.6 C)  SpO2: 97% 96%    Recent laboratory studies:  Lab Results  Component Value Date   HGB 8.3 (L) 02/07/2019   HGB 6.3 (LL) 02/06/2019   HGB 7.7 (L) 02/05/2019   Lab Results  Component Value Date   WBC 5.8 02/06/2019   PLT 132 (L) 02/06/2019   Lab Results  Component Value Date   INR 1.14 06/16/2011   Lab Results  Component Value Date   NA 138 02/04/2019   K 3.9 02/04/2019   CL 109 02/04/2019   CO2 26 02/04/2019   BUN 12 02/04/2019   CREATININE 0.61 02/04/2019  GLUCOSE 117 (H) 02/04/2019    Discharge Medications:   Allergies as of 02/07/2019   No Known Allergies     Medication List    STOP taking these medications   acetaminophen 500 MG tablet Commonly known as:  TYLENOL   alendronate 70 MG tablet Commonly known as:  FOSAMAX   meloxicam 15 MG tablet Commonly known as:  MOBIC   naproxen sodium 220 MG tablet Commonly known as:  ALEVE     TAKE these medications   albuterol 108 (90 Base) MCG/ACT inhaler Commonly known as:  PROVENTIL HFA;VENTOLIN HFA Inhale 2 puffs into the lungs every 4 (four) hours as needed. For wheeze or shortness of breath What changed:    reasons to take this  additional instructions   apixaban 2.5 MG Tabs tablet Commonly known as:  ELIQUIS Take 1 tablet (2.5 mg total) by  mouth every 12 (twelve) hours.   aspirin 81 MG tablet Take 81 mg by mouth daily.   azelastine 0.1 % nasal spray Commonly known as:  ASTELIN Place 1 spray into both nostrils daily as needed for rhinitis or allergies.   budesonide-formoterol 160-4.5 MCG/ACT inhaler Commonly known as:  SYMBICORT Inhale 2 puffs into the lungs 2 (two) times daily.   cefdinir 300 MG capsule Commonly known as:  OMNICEF Take 300 mg by mouth 2 (two) times daily. Take 1 capsule by mouth twice a day for 10 days   docusate sodium 100 MG capsule Commonly known as:  COLACE Take 1 capsule (100 mg total) by mouth 2 (two) times daily.   ferrous sulfate 325 (65 FE) MG EC tablet Take 325 mg by mouth daily.   fluticasone 50 MCG/ACT nasal spray Commonly known as:  FLONASE Place 1 spray into both nostrils 2 (two) times daily as needed for allergies or rhinitis.   furosemide 40 MG tablet Commonly known as:  LASIX TAKE 2 TABLETS BY MOUTH DAILY. ANNUAL APPT DUE IN DEC MUST SEE PROVIDER FOR FUTURE REFILLS What changed:  See the new instructions.   HYDROcodone-acetaminophen 5-325 MG tablet Commonly known as:  NORCO/VICODIN Take 1 tablet by mouth every 4 (four) hours as needed for moderate pain (pain score 4-6).   Icy Hot Back Extra Strength 5 % Ptch Generic drug:  Menthol Apply 1 patch topically daily as needed (pain).   Klor-Con M20 20 MEQ tablet Generic drug:  potassium chloride SA TAKE 1 TABLET BY MOUTH EVERY DAY What changed:    how much to take  when to take this   montelukast 10 MG tablet Commonly known as:  SINGULAIR Take 10 mg by mouth every evening.   omeprazole 40 MG capsule Commonly known as:  PRILOSEC TAKE 1 CAPSULE BY MOUTH EVERY DAY What changed:  how much to take   ondansetron 4 MG tablet Commonly known as:  ZOFRAN Take 1 tablet (4 mg total) by mouth every 6 (six) hours as needed for nausea.   pravastatin 40 MG tablet Commonly known as:  PRAVACHOL Take 1 tablet (40 mg total) by  mouth daily. What changed:  when to take this   senna 8.6 MG Tabs tablet Commonly known as:  SENOKOT Take 2 tablets (17.2 mg total) by mouth at bedtime.   traZODone 100 MG tablet Commonly known as:  DESYREL Take 1 tablet (100 mg total) by mouth at bedtime. What changed:    when to take this  reasons to take this            Durable Medical Equipment  (  From admission, onward)         Start     Ordered   02/05/19 1051  For home use only DME 3 n 1  Once     02/05/19 1051   02/05/19 0921  For home use only DME Bedside commode  Once    Question:  Patient needs a bedside commode to treat with the following condition  Answer:  S/P knee surgery   02/05/19 0921          Diagnostic Studies: Dg Knee Left Port  Result Date: 02/03/2019 CLINICAL DATA:  Postop knee replacement EXAM: PORTABLE LEFT KNEE - 1-2 VIEW COMPARISON:  06/05/2011 FINDINGS: Status post left knee replacement with intact hardware and normal alignment. Surgical drain in the suprapatellar soft tissues with soft tissue gas. Nonspecific calcifications over the distal anterior thigh. There is lucency and increased density at the posterior tibial plateau. IMPRESSION: Status post left knee replacement with expected postsurgical changes. Electronically Signed   By: Donavan Foil M.D.   On: 02/03/2019 16:53    Disposition: Discharge disposition: 01-Home or Self Care       Discharge Instructions    Call MD / Call 911   Complete by:  As directed    If you experience chest pain or shortness of breath, CALL 911 and be transported to the hospital emergency room.  If you develope a fever above 101 F, pus (white drainage) or increased drainage or redness at the wound, or calf pain, call your surgeon's office.   Constipation Prevention   Complete by:  As directed    Drink plenty of fluids.  Prune juice may be helpful.  You may use a stool softener, such as Colace (over the counter) 100 mg twice a day.  Use MiraLax (over the  counter) for constipation as needed.   Diet - low sodium heart healthy   Complete by:  As directed    Discharge instructions   Complete by:  As directed    Touch down weight bearing left lower extremity   Do not put a pillow under the knee. Place it under the heel.   Complete by:  As directed    Driving restrictions   Complete by:  As directed    No driving for 6 weeks   Increase activity slowly as tolerated   Complete by:  As directed    Lifting restrictions   Complete by:  As directed    No lifting for 12 weeks   TED hose   Complete by:  As directed    Use stockings (TED hose) for 2 weeks on both leg(s).  You may remove them at night for sleeping.      Follow-up Information    Jeanne Diefendorf, Aaron Edelman, MD. Schedule an appointment as soon as possible for a visit in 2 weeks.   Specialty:  Orthopedic Surgery Why:  For wound re-check Contact information: [address] [place] [address] [phone]        [facility] - Dme Follow up.   Why:  home bedside commode.       [facility] Follow up.   Specialty:  Specialist Why:  Out patient PT-the office will can you to make arrangements. Contact information: [address] [place] [address] [phone]        [place] .            Signed: Hilton Cork Bilbo Carcamo 02/07/2019, 2:14 PM

## 2019-02-04 NOTE — Progress Notes (Signed)
    Subjective:  Patient reports pain as mild to moderate.  Denies N/V/CP/SOB. No c/o.  Objective:   VITALS:   Vitals:   02/03/19 2207 02/03/19 2211 02/04/19 0134 02/04/19 0538  BP: 138/77  103/62 (!) 110/57  Pulse: (!) 41 (!) 54 (!) 47 (!) 46  Resp: 16  17 15   Temp: 98.7 F (37.1 C)  98.3 F (36.8 C) (!) 97.4 F (36.3 C)  TempSrc: Oral  Oral Oral  SpO2: 100%  100% 100%  Weight:      Height:        NAD ABD soft Sensation intact distally Intact pulses distally Dorsiflexion/Plantar flexion intact Incision: dressing C/D/I Compartment soft Able to SLR HV ss   Lab Results  Component Value Date   WBC 5.4 02/04/2019   HGB 8.2 (L) 02/04/2019   HCT 28.7 (L) 02/04/2019   MCV 93.5 02/04/2019   PLT 151 02/04/2019   BMET    Component Value Date/Time   NA 138 02/04/2019 0505   NA 143 07/08/2018 0946   K 3.9 02/04/2019 0505   CL 109 02/04/2019 0505   CO2 26 02/04/2019 0505   GLUCOSE 117 (H) 02/04/2019 0505   BUN 12 02/04/2019 0505   BUN 16 07/08/2018 0946   CREATININE 0.61 02/04/2019 0505   CALCIUM 7.6 (L) 02/04/2019 0505   GFRNONAA >60 02/04/2019 0505   GFRAA >60 02/04/2019 0505     Recent Results (from the past 240 hour(s))  Surgical pcr screen     Status: Abnormal   Collection Time: 01/27/19 10:43 AM  Result Value Ref Range Status   MRSA, PCR POSITIVE (A) NEGATIVE Final    Comment: RESULT CALLED TO, READ BACK BY AND VERIFIED WITH: MOORE,M. RN @1308  ON 02.27.2020 BY COHEN,K    Staphylococcus aureus POSITIVE (A) NEGATIVE Final    Comment: (NOTE) The Xpert SA Assay (FDA approved for NASAL specimens in patients 22 years of age and older), is one component of a comprehensive surveillance program. It is not intended to diagnose infection nor to guide or monitor treatment. Performed at [facility], [place] [address] [place], [place] [address]   Aerobic/Anaerobic Culture (surgical/deep wound)     Status: None (Preliminary result)   Collection Time: 02/03/19 11:15 AM  Result Value Ref Range Status   Specimen Description TISSUE LEFT KNEE  Final   Special Requests PATIENT ON FOLLOWING ANCEF,VANCOMYCIN  Final   Gram Stain   Final    RARE WBC PRESENT,BOTH PMN AND MONONUCLEAR NO ORGANISMS SEEN Gram Stain Report Called to,Read Back By and Verified With: HOUSE, J. RN @1208  ON 3.5.2020 BY NMCCOY Performed at [facility], [place] [address] [place], [place] [address]    Culture PENDING  Incomplete   Report Status PENDING  Incomplete     Assessment/Plan: 1 Day Post-Op   Principal Problem:   Failed total knee, left ([facility]) Active Problems:   Failed total knee, left, initial encounter ([place])   TDWB with walker DVT ppx: apixaban, SCDs, TEDS PO pain control PT/OT D/C HV drain Dispo: follow intraop culture, D/C home with OPPT vs HHPT on Sat or Sun   Hilton Cork Nicco Reaume 02/04/2019, 10:18 AM   Rod Can, MD Cell: [phone] [facility] now [facility]  [place] [address] [place], [place], [place] [address] Phone: [phone] [email or web address] Facebook  [employer]

## 2019-02-04 NOTE — Evaluation (Signed)
Occupational Therapy Evaluation Patient Details Name: TAMARAH BHULLAR MRN: [id number] DOB: 1941-04-15 Today's Date: 02/04/2019    History of Present Illness s/p L TKR   Clinical Impression   This 78 year old female was admitted for the above sx. She is following 30% weight bearing restrictions with cues. She does fatique easily.  Pt's cousin was present and will assist her at home. Pt was mod I prior to admission and needs mod/max A for LB adls and min A to ambulate to bathroom. She will sponge bathe at home. Will follow in acute setting with min guard to min A level goals    Follow Up Recommendations  Supervision/Assistance - 24 hour    Equipment Recommendations  3 in 1 bedside commode    Recommendations for Other Services       Precautions / Restrictions Precautions Precautions: Knee;Fall Restrictions LLE Weight Bearing: Partial weight bearing LLE Partial Weight Bearing Percentage or Pounds: 30%      Mobility Bed Mobility               General bed mobility comments: oob  Transfers Overall transfer level: Needs assistance Equipment used: Rolling walker (2 wheeled) Transfers: Sit to/from Stand Sit to Stand: Min assist         General transfer comment: assist to rise and stabilize. Cues for UE/LE placement    Balance                                           ADL either performed or assessed with clinical judgement   ADL Overall ADL's : Needs assistance/impaired Eating/Feeding: Independent   Grooming: Set up   Upper Body Bathing: Set up   Lower Body Bathing: Maximal assistance;Moderate assistance;Sit to/from stand   Upper Body Dressing : Min guard   Lower Body Dressing: Maximal assistance;Sit to/from stand   Toilet Transfer: Minimal assistance;Ambulation;BSC;RW;Comfort height toilet   Toileting- Clothing Manipulation and Hygiene: Maximal assistance;Sit to/from stand         General ADL Comments: pt has difficulty releasing hand  in standing and maintaining her WB restrictions. Cues for sequence 75% of the walk to the bathroom. brought chair close to door as she was fatiqued.  When almost to chair, she c/o dizziness. Sitting BP was 108/62     Vision         Perception     Praxis      Pertinent Vitals/Pain Pain Assessment: Faces Faces Pain Scale: Hurts even more Pain Location: L knee Pain Descriptors / Indicators: Aching Pain Intervention(s): Limited activity within patient's tolerance;Monitored during session;Premedicated before session;Repositioned;Ice applied     Hand Dominance     Extremity/Trunk Assessment Upper Extremity Assessment Upper Extremity Assessment: Overall WFL for tasks assessed           Communication Communication Communication: No difficulties   Cognition Arousal/Alertness: Awake/alert Behavior During Therapy: WFL for tasks assessed/performed Overall Cognitive Status: Within Functional Limits for tasks assessed                                     General Comments  c/o dizziness after walking to BR  seated BP 108/62    Exercises     Shoulder Instructions      Home Living Family/patient expects to be discharged to:: Private residence Living Arrangements:  Alone Available Help at Discharge: Family               Bathroom Shower/Tub: Teacher, early years/pre: Handicapped height         Additional Comments: cousin will assist      Prior Functioning/Environment Level of Independence: Independent with assistive device(s)                 OT Problem List: Decreased strength;Decreased activity tolerance;Decreased knowledge of use of DME or AE;Decreased knowledge of precautions;Pain      OT Treatment/Interventions: Self-care/ADL training;DME and/or AE instruction;Therapeutic activities;Patient/family education    OT Goals(Current goals can be found in the care plan section) Acute Rehab OT Goals Patient Stated Goal: home OT Goal  Formulation: With patient Time For Goal Achievement: 02/18/19 Potential to Achieve Goals: Good ADL Goals Pt Will Perform Grooming: with min guard assist;standing Pt Will Transfer to Toilet: with min guard assist;ambulating;bedside commode Pt Will Perform Toileting - Clothing Manipulation and hygiene: with min assist;sit to/from stand  OT Frequency: Min 2X/week   Barriers to D/C:            Co-evaluation              AM-PAC OT "6 Clicks" Daily Activity     Outcome Measure Help from another person eating meals?: None Help from another person taking care of personal grooming?: A Little Help from another person toileting, which includes using toliet, bedpan, or urinal?: A Lot Help from another person bathing (including washing, rinsing, drying)?: A Lot Help from another person to put on and taking off regular upper body clothing?: A Little Help from another person to put on and taking off regular lower body clothing?: A Lot 6 Click Score: 16   End of Session    Activity Tolerance: Patient limited by fatigue Patient left: in chair;with call bell/phone within reach;with nursing/sitter in room  OT Visit Diagnosis: Unsteadiness on feet (R26.81)                Time: 9381-0175 OT Time Calculation (min): 28 min Charges:  OT General Charges $OT Visit: 1 Visit OT Evaluation $OT Eval Low Complexity: 1 Low OT Treatments $Self Care/Home Management : 8-22 mins  Lesle Chris, OTR/L Acute Rehabilitation Services [phone] [facility] pager [phone] office 02/04/2019  [place] 02/04/2019, 12:28 PM

## 2019-02-04 NOTE — Progress Notes (Signed)
Physical Therapy Treatment Patient Details Name: [name] MRN: [id number] DOB: 1941-06-27 Today's Date: 02/04/2019    History of Present Illness s/p L TKR revision and hx of RSD, CAD, COPD, and Bil TKR    PT Comments    Pt motivated but progressing slowly with mobility 2* PWB 30% status and obesity.     Follow Up Recommendations  Follow surgeon's recommendation for DC plan and follow-up therapies     Equipment Recommendations  None recommended by PT    Recommendations for Other Services       Precautions / Restrictions Precautions Precautions: Knee;Fall Restrictions Weight Bearing Restrictions: Yes LLE Weight Bearing: Partial weight bearing LLE Partial Weight Bearing Percentage or Pounds: 30%    Mobility  Bed Mobility Overal bed mobility: Needs Assistance Bed Mobility: Sit to Supine     Supine to sit: Min assist;Mod assist Sit to supine: Min assist;Mod assist   General bed mobility comments: cues for sequence and use of R LE to self assist  Transfers Overall transfer level: Needs assistance Equipment used: Rolling walker (2 wheeled) Transfers: Sit to/from Stand Sit to Stand: Min assist         General transfer comment: assist to rise and stabilize. Cues for UE/LE placement.  From recliner; to/from [facility], to EOB  Ambulation/Gait Ambulation/Gait assistance: Min assist Gait Distance (Feet): 16 Feet(twice) Assistive device: Rolling walker (2 wheeled) Gait Pattern/deviations: Step-to pattern;Decreased step length - right;Decreased step length - left;Shuffle;Trunk flexed Gait velocity: decr   General Gait Details: cues for posture, position from RW and step-by-step cues for sequence and UE WB to insure PWB on L LE   Stairs             Wheelchair Mobility    Modified Rankin (Stroke Patients Only)       Balance Overall balance assessment: Mild deficits observed, not formally tested                                           Cognition Arousal/Alertness: Awake/alert Behavior During Therapy: WFL for tasks assessed/performed Overall Cognitive Status: Within Functional Limits for tasks assessed                                        Exercises Total Joint Exercises Ankle Circles/Pumps: AROM;Both;15 reps;Supine Quad Sets: AROM;Both;10 reps;Supine Heel Slides: AAROM;Left;15 reps;Supine Straight Leg Raises: AAROM;Left;10 reps;Supine Goniometric ROM: AAROM L knee -8 - 35 with ++ muscle guarding     General Comments General comments (skin integrity, edema, etc.): c/o dizziness after walking to BR  seated BP 108/62      Pertinent Vitals/Pain Pain Assessment: 0-10 Pain Score: 8  Faces Pain Scale: Hurts even more Pain Location: L knee Pain Descriptors / Indicators: Aching;Sore Pain Intervention(s): Limited activity within patient's tolerance;Monitored during session;Premedicated before session;Ice applied    Home Living Family/patient expects to be discharged to:: Private residence Living Arrangements: Alone Available Help at Discharge: Family Type of Home: House Home Access: Stairs to enter Entrance Stairs-Rails: Right;Left;Can reach both Home Layout: One level   Additional Comments: cousin will assist    Prior Function Level of Independence: Independent with assistive device(s)          PT Goals (current goals can now be found in the care plan section) Acute Rehab PT  Goals Patient Stated Goal: Regain IND PT Goal Formulation: With patient Time For Goal Achievement: 02/11/19 Potential to Achieve Goals: Good Progress towards PT goals: Progressing toward goals    Frequency    7X/week      PT Plan Current plan remains appropriate    Co-evaluation              AM-PAC PT "6 Clicks" Mobility   Outcome Measure  Help needed turning from your back to your side while in a flat bed without using bedrails?: A Little Help needed moving from lying on your back to sitting on  the side of a flat bed without using bedrails?: A Lot Help needed moving to and from a bed to a chair (including a wheelchair)?: A Little Help needed standing up from a chair using your arms (e.g., wheelchair or bedside chair)?: A Little Help needed to walk in hospital room?: A Little Help needed climbing 3-5 steps with a railing? : A Lot 6 Click Score: 16    End of Session Equipment Utilized During Treatment: Gait belt Activity Tolerance: Patient tolerated treatment well;Patient limited by fatigue Patient left: in bed;with call bell/phone within reach;with family/visitor present Nurse Communication: Mobility status PT Visit Diagnosis: Difficulty in walking, not elsewhere classified (R26.2)     Time: 3953-2023 PT Time Calculation (min) (ACUTE ONLY): 28 min  Charges:  $Gait Training: 8-22 mins $Therapeutic Exercise: 8-22 mins                     [place] Pager [phone] Office [phone]    Valeria Krisko 02/04/2019, 2:43 PM

## 2019-02-04 NOTE — Evaluation (Signed)
Physical Therapy Evaluation Patient Details Name: [name] MRN: [id number] DOB: 1941/11/14 Today's Date: 02/04/2019   History of Present Illness  s/p L TKR revision and hx of RSD, CAD, COPD, and Bil TKR  Clinical Impression  Pt s/p L TKR revision and presents with decreased strength/ROM L LE; PWB allowance on L LE; and obesity limiting functional mobility.  Pt hopes to progress to dc home with family assist.    Follow Up Recommendations Follow surgeon's recommendation for DC plan and follow-up therapies    Equipment Recommendations  Other (comment)(TBD)    Recommendations for Other Services       Precautions / Restrictions Precautions Precautions: Knee;Fall Restrictions Weight Bearing Restrictions: Yes LLE Weight Bearing: Partial weight bearing LLE Partial Weight Bearing Percentage or Pounds: 30%      Mobility  Bed Mobility Overal bed mobility: Needs Assistance Bed Mobility: Supine to Sit     Supine to sit: Min assist;Mod assist     General bed mobility comments: cues for sequence and use of R LE to self assist  Transfers Overall transfer level: Needs assistance Equipment used: Rolling walker (2 wheeled) Transfers: Sit to/from Stand Sit to Stand: Min assist         General transfer comment: assist to rise and stabilize. Cues for UE/LE placement  Ambulation/Gait Ambulation/Gait assistance: Min assist Gait Distance (Feet): 24 Feet Assistive device: Rolling walker (2 wheeled) Gait Pattern/deviations: Step-to pattern;Decreased step length - right;Decreased step length - left;Shuffle;Trunk flexed Gait velocity: decr   General Gait Details: cues for posture, position from RW and step-by-step cues for sequence and UE WB to insure PWB on L LE  Stairs            Wheelchair Mobility    Modified Rankin (Stroke Patients Only)       Balance Overall balance assessment: Mild deficits observed, not formally tested                                            Pertinent Vitals/Pain Pain Assessment: 0-10 Pain Score: 4  Faces Pain Scale: Hurts even more Pain Location: L knee Pain Descriptors / Indicators: Aching;Sore Pain Intervention(s): Limited activity within patient's tolerance;Monitored during session;Premedicated before session;Ice applied    Home Living Family/patient expects to be discharged to:: Private residence Living Arrangements: Alone Available Help at Discharge: Family Type of Home: House Home Access: Stairs to enter Entrance Stairs-Rails: Right;Left;Can reach both Technical brewer of Steps: 5 Home Layout: One level   Additional Comments: cousin will assist    Prior Function Level of Independence: Independent with assistive device(s)               Hand Dominance        Extremity/Trunk Assessment   Upper Extremity Assessment Upper Extremity Assessment: Overall WFL for tasks assessed    Lower Extremity Assessment Lower Extremity Assessment: LLE deficits/detail       Communication   Communication: No difficulties  Cognition Arousal/Alertness: Awake/alert Behavior During Therapy: WFL for tasks assessed/performed Overall Cognitive Status: Within Functional Limits for tasks assessed                                        General Comments General comments (skin integrity, edema, etc.): c/o dizziness after walking to BR  seated BP  108/62    Exercises     Assessment/Plan    PT Assessment Patient needs continued PT services  PT Problem List Decreased strength;Decreased range of motion;Decreased activity tolerance;Decreased balance;Decreased mobility;Decreased knowledge of use of DME;Obesity;Pain       PT Treatment Interventions DME instruction;Gait training;Stair training;Functional mobility training;Therapeutic activities;Therapeutic exercise;Patient/family education    PT Goals (Current goals can be found in the Care Plan section)  Acute Rehab PT  Goals Patient Stated Goal: Regain IND PT Goal Formulation: With patient Time For Goal Achievement: 02/11/19 Potential to Achieve Goals: Good    Frequency 7X/week   Barriers to discharge        Co-evaluation               AM-PAC PT "6 Clicks" Mobility  Outcome Measure Help needed turning from your back to your side while in a flat bed without using bedrails?: A Little Help needed moving from lying on your back to sitting on the side of a flat bed without using bedrails?: A Lot Help needed moving to and from a bed to a chair (including a wheelchair)?: A Lot Help needed standing up from a chair using your arms (e.g., wheelchair or bedside chair)?: A Little Help needed to walk in hospital room?: A Little Help needed climbing 3-5 steps with a railing? : A Lot 6 Click Score: 15    End of Session Equipment Utilized During Treatment: Gait belt Activity Tolerance: Patient tolerated treatment well;Patient limited by fatigue Patient left: in chair;with call bell/phone within reach;with family/visitor present Nurse Communication: Mobility status PT Visit Diagnosis: Difficulty in walking, not elsewhere classified (R26.2)    Time: 1749-4496 PT Time Calculation (min) (ACUTE ONLY): 31 min   Charges:   PT Evaluation $PT Eval Low Complexity: 1 Low PT Treatments $Gait Training: 8-22 mins        [place] Pager [phone] Office [phone]   Sabryn Preslar 02/04/2019, 1:20 PM

## 2019-02-04 NOTE — Plan of Care (Signed)
  Problem: Education: Goal: Knowledge of General Education information will improve Description Including pain rating scale, medication(s)/side effects and non-pharmacologic comfort measures Outcome: Progressing   Problem: Clinical Measurements: Goal: Ability to maintain clinical measurements within normal limits will improve Outcome: Progressing   Problem: Pain Managment: Goal: General experience of comfort will improve Outcome: Progressing   Problem: Pain Management: Goal: Pain level will decrease with appropriate interventions Outcome: Progressing

## 2019-02-05 LAB — CBC
HCT: 27.2 % — ABNORMAL LOW (ref 36.0–46.0)
Hemoglobin: 7.7 g/dL — ABNORMAL LOW (ref 12.0–15.0)
MCH: 26.5 pg (ref 26.0–34.0)
MCHC: 28.3 g/dL — ABNORMAL LOW (ref 30.0–36.0)
MCV: 93.5 fL (ref 80.0–100.0)
Platelets: 145 10*3/uL — ABNORMAL LOW (ref 150–400)
RBC: 2.91 MIL/uL — ABNORMAL LOW (ref 3.87–5.11)
RDW: 13.3 % (ref 11.5–15.5)
WBC: 6.6 10*3/uL (ref 4.0–10.5)
nRBC: 0 % (ref 0.0–0.2)

## 2019-02-05 NOTE — Plan of Care (Signed)
  Problem: Health Behavior/Discharge Planning: Goal: Ability to manage health-related needs will improve Outcome: Progressing   Problem: Clinical Measurements: Goal: Ability to maintain clinical measurements within normal limits will improve Outcome: Progressing Goal: Will remain free from infection Outcome: Progressing Goal: Diagnostic test results will improve Outcome: Progressing Goal: Respiratory complications will improve Outcome: Progressing Goal: Cardiovascular complication will be avoided Outcome: Progressing   Problem: Activity: Goal: Risk for activity intolerance will decrease Outcome: Progressing   Problem: Nutrition: Goal: Adequate nutrition will be maintained Outcome: Progressing   Problem: Coping: Goal: Level of anxiety will decrease Outcome: Progressing   Problem: Elimination: Goal: Will not experience complications related to bowel motility Outcome: Progressing Goal: Will not experience complications related to urinary retention Outcome: Progressing   Problem: Pain Managment: Goal: General experience of comfort will improve Outcome: Progressing   Problem: Safety: Goal: Ability to remain free from injury will improve Outcome: Progressing   Problem: Skin Integrity: Goal: Risk for impaired skin integrity will decrease Outcome: Progressing   Problem: Education: Goal: Knowledge of the prescribed therapeutic regimen will improve Outcome: Progressing Goal: Individualized Educational Video(s) Outcome: Progressing   Problem: Activity: Goal: Ability to avoid complications of mobility impairment will improve Outcome: Progressing Goal: Range of joint motion will improve Outcome: Progressing   Problem: Clinical Measurements: Goal: Postoperative complications will be avoided or minimized Outcome: Progressing   Problem: Pain Management: Goal: Pain level will decrease with appropriate interventions Outcome: Progressing   Problem: Skin  Integrity: Goal: Will show signs of wound healing Outcome: Progressing

## 2019-02-05 NOTE — Progress Notes (Signed)
Physical Therapy Treatment Patient Details Name: [name] MRN: [id number] DOB: 1941/07/02 Today's Date: 02/05/2019    History of Present Illness s/p L TKR revision and hx of RSD, CAD, COPD, and Bil TKR    PT Comments    Pt continues cooperative but struggling to comply with 30% PWB on L LE and with ambulatory distance ltd this pm by c/o dizziness - BP 56/40 - pt returned to bed and RN alerted.   Follow Up Recommendations  Follow surgeon's recommendation for DC plan and follow-up therapies;Home health PT     Equipment Recommendations  None recommended by PT    Recommendations for Other Services       Precautions / Restrictions Precautions Precautions: Knee;Fall Restrictions Weight Bearing Restrictions: Yes LLE Weight Bearing: Partial weight bearing LLE Partial Weight Bearing Percentage or Pounds: 30%    Mobility  Bed Mobility Overal bed mobility: Needs Assistance Bed Mobility: Sit to Supine       Sit to supine: Min assist;Mod assist;+2 for physical assistance   General bed mobility comments: Increased time with cues for sequence and use of R LE to self assist  Transfers Overall transfer level: Needs assistance Equipment used: Rolling walker (2 wheeled) Transfers: Sit to/from Stand Sit to Stand: Min assist;From elevated surface         General transfer comment: assist to rise and stabilize. Cues for UE/LE placement.   Ambulation/Gait Ambulation/Gait assistance: Min assist Gait Distance (Feet): 16 Feet Assistive device: Rolling walker (2 wheeled) Gait Pattern/deviations: Step-to pattern;Decreased step length - right;Decreased step length - left;Shuffle;Trunk flexed Gait velocity: decr   General Gait Details: cues for posture, position from RW and step-by-step cues for sequence and UE WB to insure PWB on L LE - pt struggling with compliance.  Distance ltd by c/o dizziness and SOB   Stairs             Wheelchair Mobility    Modified Rankin  (Stroke Patients Only)       Balance Overall balance assessment: Mild deficits observed, not formally tested                                          Cognition Arousal/Alertness: Awake/alert Behavior During Therapy: WFL for tasks assessed/performed Overall Cognitive Status: Within Functional Limits for tasks assessed                                        Exercises      General Comments        Pertinent Vitals/Pain Pain Assessment: 0-10 Pain Score: 7  Pain Location: L knee Pain Descriptors / Indicators: Aching;Sore Pain Intervention(s): Limited activity within patient's tolerance;Monitored during session;Ice applied    Home Living                      Prior Function            PT Goals (current goals can now be found in the care plan section) Acute Rehab PT Goals Patient Stated Goal: Regain IND PT Goal Formulation: With patient Time For Goal Achievement: 02/11/19 Potential to Achieve Goals: Good Progress towards PT goals: Not progressing toward goals - comment(fatigue, dizziness and SOB with OOB activity)    Frequency    7X/week  PT Plan Current plan remains appropriate    Co-evaluation              AM-PAC PT "6 Clicks" Mobility   Outcome Measure  Help needed turning from your back to your side while in a flat bed without using bedrails?: A Little Help needed moving from lying on your back to sitting on the side of a flat bed without using bedrails?: A Lot Help needed moving to and from a bed to a chair (including a wheelchair)?: A Lot Help needed standing up from a chair using your arms (e.g., wheelchair or bedside chair)?: A Little Help needed to walk in hospital room?: A Little Help needed climbing 3-5 steps with a railing? : A Lot 6 Click Score: 15    End of Session Equipment Utilized During Treatment: Gait belt Activity Tolerance: Patient tolerated treatment well;Patient limited by  fatigue;Patient limited by pain Patient left: in bed;with call bell/phone within reach;with family/visitor present Nurse Communication: Mobility status PT Visit Diagnosis: Difficulty in walking, not elsewhere classified (R26.2)     Time: 3291-9166 PT Time Calculation (min) (ACUTE ONLY): 20 min  Charges:  $Gait Training: 8-22 mins                     [place] Pager [phone] Office [phone]    Yassen Kinnett 02/05/2019, 2:54 PM

## 2019-02-05 NOTE — Progress Notes (Signed)
Spoke to pt and she is agreeable to HHPT, Will need HHPT order with [facility] [place] for 3n1 for home to be delivered to room prior to dc. She has RW at home. Jonnie Finner RN CCM Case Mgmt phone [phone]

## 2019-02-05 NOTE — Progress Notes (Signed)
Physical Therapy Treatment Patient Details Name: [name] MRN: [id number] DOB: February 19, 1941 Today's Date: 02/05/2019    History of Present Illness s/p L TKR revision and hx of RSD, CAD, COPD, and Bil TKR    PT Comments    Pt continues cooperative but ltd this am by fatigue, c/o increased pain and is struggling to follow cues and follow through with WB limitations.   Follow Up Recommendations  Follow surgeon's recommendation for DC plan and follow-up therapies;Home health PT     Equipment Recommendations  None recommended by PT    Recommendations for Other Services       Precautions / Restrictions Precautions Precautions: Knee;Fall Restrictions Weight Bearing Restrictions: Yes LLE Weight Bearing: Partial weight bearing LLE Partial Weight Bearing Percentage or Pounds: 30%    Mobility  Bed Mobility Overal bed mobility: Needs Assistance Bed Mobility: Supine to Sit     Supine to sit: Min assist;Mod assist     General bed mobility comments: Increased time with cues for sequence and use of R LE to self assist  Transfers Overall transfer level: Needs assistance Equipment used: Rolling walker (2 wheeled) Transfers: Sit to/from Stand Sit to Stand: Min assist         General transfer comment: assist to rise and stabilize. Cues for UE/LE placement.  From recliner; to/from [facility], to EOB  Ambulation/Gait Ambulation/Gait assistance: Min assist Gait Distance (Feet): 24 Feet Assistive device: Rolling walker (2 wheeled) Gait Pattern/deviations: Step-to pattern;Decreased step length - right;Decreased step length - left;Shuffle;Trunk flexed Gait velocity: decr   General Gait Details: cues for posture, position from RW and step-by-step cues for sequence and UE WB to insure PWB on L LE - pt struggling with compliance   Stairs             Wheelchair Mobility    Modified Rankin (Stroke Patients Only)       Balance Overall balance assessment: Mild deficits  observed, not formally tested                                          Cognition Arousal/Alertness: Awake/alert Behavior During Therapy: WFL for tasks assessed/performed Overall Cognitive Status: Within Functional Limits for tasks assessed                                        Exercises Total Joint Exercises Ankle Circles/Pumps: AROM;Both;15 reps;Supine Quad Sets: AROM;Both;10 reps;Supine Heel Slides: AAROM;Left;15 reps;Supine Straight Leg Raises: AAROM;Left;10 reps;Supine Goniometric ROM: AAROM L knee -8 - 40 with muscle guarding    General Comments        Pertinent Vitals/Pain Pain Assessment: 0-10 Pain Score: 8  Pain Location: L knee Pain Descriptors / Indicators: Aching;Sore Pain Intervention(s): Limited activity within patient's tolerance;Monitored during session;Premedicated before session;Ice applied    Home Living                      Prior Function            PT Goals (current goals can now be found in the care plan section) Acute Rehab PT Goals Patient Stated Goal: Regain IND PT Goal Formulation: With patient Time For Goal Achievement: 02/11/19 Potential to Achieve Goals: Good Progress towards PT goals: Progressing toward goals    Frequency  7X/week      PT Plan Discharge plan needs to be updated    Co-evaluation              AM-PAC PT "6 Clicks" Mobility   Outcome Measure  Help needed turning from your back to your side while in a flat bed without using bedrails?: A Little Help needed moving from lying on your back to sitting on the side of a flat bed without using bedrails?: A Lot Help needed moving to and from a bed to a chair (including a wheelchair)?: A Little Help needed standing up from a chair using your arms (e.g., wheelchair or bedside chair)?: A Little Help needed to walk in hospital room?: A Little Help needed climbing 3-5 steps with a railing? : A Lot 6 Click Score: 16    End  of Session Equipment Utilized During Treatment: Gait belt Activity Tolerance: Patient tolerated treatment well;Patient limited by fatigue;Patient limited by pain Patient left: with call bell/phone within reach;with family/visitor present;in chair Nurse Communication: Mobility status PT Visit Diagnosis: Difficulty in walking, not elsewhere classified (R26.2)     Time: 0867-6195 PT Time Calculation (min) (ACUTE ONLY): 31 min  Charges:  $Gait Training: 8-22 mins $Therapeutic Exercise: 8-22 mins                     [place] Pager [phone] Office [phone]    Sava Proby 02/05/2019, 11:03 AM

## 2019-02-05 NOTE — Progress Notes (Signed)
Subjective:  Patient reports pain as mild to moderate.  Denies N/V/CP/SOB. No c/o.  Objective:   VITALS:   Vitals:   02/04/19 1215 02/04/19 1327 02/04/19 2115 02/05/19 0555  BP:  104/68 112/61 106/73  Pulse: (!) 56 (!) 57 61 73  Resp: 18 16 15 16   Temp:  98.1 F (36.7 C) 97.9 F (36.6 C) 99.3 F (37.4 C)  TempSrc:  Oral Oral Oral  SpO2: 100% 100% 100% 99%  Weight:      Height:        NAD ABD soft Sensation intact distally Intact pulses distally Dorsiflexion/Plantar flexion intact Incision: dressing C/D/I Compartment soft Able to SLR    Lab Results  Component Value Date   WBC 6.6 02/05/2019   HGB 7.7 (L) 02/05/2019   HCT 27.2 (L) 02/05/2019   MCV 93.5 02/05/2019   PLT 145 (L) 02/05/2019   BMET    Component Value Date/Time   NA 138 02/04/2019 0505   NA 143 07/08/2018 0946   K 3.9 02/04/2019 0505   CL 109 02/04/2019 0505   CO2 26 02/04/2019 0505   GLUCOSE 117 (H) 02/04/2019 0505   BUN 12 02/04/2019 0505   BUN 16 07/08/2018 0946   CREATININE 0.61 02/04/2019 0505   CALCIUM 7.6 (L) 02/04/2019 0505   GFRNONAA >60 02/04/2019 0505   GFRAA >60 02/04/2019 0505     Recent Results (from the past 240 hour(s))  Surgical pcr screen     Status: Abnormal   Collection Time: 01/27/19 10:43 AM  Result Value Ref Range Status   MRSA, PCR POSITIVE (A) NEGATIVE Final    Comment: RESULT CALLED TO, READ BACK BY AND VERIFIED WITH: MOORE,M. RN @1308  ON 02.27.2020 BY COHEN,K    Staphylococcus aureus POSITIVE (A) NEGATIVE Final    Comment: (NOTE) The Xpert SA Assay (FDA approved for NASAL specimens in patients 90 years of age and older), is one component of a comprehensive surveillance program. It is not intended to diagnose infection nor to guide or monitor treatment. Performed at [facility], [place] [address] [place], [place] [address]   Aerobic/Anaerobic Culture (surgical/deep wound)     Status: None (Preliminary result)   Collection Time:  02/03/19 11:15 AM  Result Value Ref Range Status   Specimen Description   Final    TISSUE LEFT KNEE Performed at [place] [address], [place], [place] [address]    Special Requests   Final    PATIENT ON FOLLOWING ANCEF,VANCOMYCIN Performed at [facility], [place] [address], [place], [place] [address]    Gram Stain   Final    RARE WBC PRESENT,BOTH PMN AND MONONUCLEAR NO ORGANISMS SEEN Gram Stain Report Called to,Read Back By and Verified With: HOUSE, J. RN @1208  ON 3.5.2020 BY [facility] Performed at [facility], [place] [address], [place], [place] [address]    Culture   Final    NO GROWTH < 24 HOURS Performed at [place] [address] [place], [place] [address]    Report Status PENDING  Incomplete     Assessment/Plan: 2 Days Post-Op   Principal Problem:   Failed total knee, left ([place]) Active Problems:   Failed total knee, left, initial encounter ([place])   TDWB with walker DVT ppx: apixaban, SCDs, TEDS PO pain control ABLA: asymptomatic, monitor PT/OT Dispo: follow intraop culture, D/C home with OPPT vs HHPT on Sun   Obie Silos J Trang Bouse 02/05/2019, 8:48 AM  Rod Can, MD Cell: [phone] [place] is now [facility]  [place] [address] [place], [place], [place] [address] Phone: [phone] [email or web address] Facebook  [employer]

## 2019-02-06 LAB — CBC
HCT: 22.2 % — ABNORMAL LOW (ref 36.0–46.0)
Hemoglobin: 6.3 g/dL — CL (ref 12.0–15.0)
MCH: 26.5 pg (ref 26.0–34.0)
MCHC: 28.4 g/dL — ABNORMAL LOW (ref 30.0–36.0)
MCV: 93.3 fL (ref 80.0–100.0)
Platelets: 132 10*3/uL — ABNORMAL LOW (ref 150–400)
RBC: 2.38 MIL/uL — ABNORMAL LOW (ref 3.87–5.11)
RDW: 13.7 % (ref 11.5–15.5)
WBC: 5.8 10*3/uL (ref 4.0–10.5)
nRBC: 0 % (ref 0.0–0.2)

## 2019-02-06 LAB — BPAM RBC
Blood Product Expiration Date: 202003252359
Blood Product Expiration Date: 202003252359
Unit Type and Rh: 6200
Unit Type and Rh: 6200

## 2019-02-06 LAB — TYPE AND SCREEN
ABO/RH(D): A POS
Antibody Screen: NEGATIVE
Unit division: 0
Unit division: 0

## 2019-02-06 LAB — PREPARE RBC (CROSSMATCH)

## 2019-02-06 MED ORDER — SODIUM CHLORIDE 0.9% IV SOLUTION: Freq: Once | INTRAVENOUS | Status: DC

## 2019-02-06 NOTE — Progress Notes (Signed)
CRITICAL VALUE ALERT  Critical Value:  6.3 hemoglobin  Date & Time Notied:  02/06/2019 @ 0625  Provider Notified: provider paged @ [phone]  Orders Received/Actions taken: awaiting order

## 2019-02-06 NOTE — Progress Notes (Signed)
Physical Therapy Treatment Patient Details Name: [name] MRN: [id number] DOB: [birth year] Today's Date: 02/06/2019    History of Present Illness s/p L TKR revision and hx of RSD, CAD, COPD, and Bil TKR    PT Comments    Pt up to ambulate after first unit transfusion.  Pt with marked improvement in activity tolerance and ability to follow commands vs yesterday.  Pt happy with progress this date and eager for dc home - Pt requesting HHPT follow up rather than OP PT.   Follow Up Recommendations  Follow surgeon's recommendation for DC plan and follow-up therapies;Home health PT     Equipment Recommendations  None recommended by PT    Recommendations for Other Services       Precautions / Restrictions Precautions Precautions: Knee;Fall Restrictions Weight Bearing Restrictions: Yes LLE Weight Bearing: Partial weight bearing LLE Partial Weight Bearing Percentage or Pounds: 30% Other Position/Activity Restrictions: Per Dr Lyla Glassing verbally - pt can increased WB to manage stairs     Mobility  Bed Mobility Overal bed mobility: Needs Assistance Bed Mobility: Sit to Supine       Sit to supine: Min assist   General bed mobility comments: cues for sequence and physical assist to manage L LE  Transfers Overall transfer level: Needs assistance Equipment used: Rolling walker (2 wheeled) Transfers: Sit to/from Stand Sit to Stand: Min guard         General transfer comment:  Cues for UE/LE placement.   Ambulation/Gait Ambulation/Gait assistance: Min guard Gait Distance (Feet): 40 Feet(40' twice and additional 15' back from bathroom) Assistive device: Rolling walker (2 wheeled) Gait Pattern/deviations: Step-to pattern;Decreased step length - right;Decreased step length - left;Shuffle;Trunk flexed Gait velocity: decr   General Gait Details: cues for posture, position from RW, sequence and UE WB to insure PWB   Stairs             Wheelchair Mobility    Modified  Rankin (Stroke Patients Only)       Balance Overall balance assessment: Mild deficits observed, not formally tested                                          Cognition Arousal/Alertness: Awake/alert Behavior During Therapy: WFL for tasks assessed/performed Overall Cognitive Status: Within Functional Limits for tasks assessed                                        Exercises Total Joint Exercises Ankle Circles/Pumps: AROM;Both;15 reps;Supine Quad Sets: AROM;Both;Supine;15 reps Heel Slides: AAROM;Left;Supine;20 reps Straight Leg Raises: AAROM;Left;Supine;20 reps Goniometric ROM: AAROM R knee - 8 - 50    General Comments        Pertinent Vitals/Pain Pain Assessment: 0-10 Pain Score: 4  Pain Location: L knee Pain Descriptors / Indicators: Aching;Sore Pain Intervention(s): Limited activity within patient's tolerance;Monitored during session;Ice applied    Home Living                      Prior Function            PT Goals (current goals can now be found in the care plan section) Acute Rehab PT Goals Patient Stated Goal: Regain IND PT Goal Formulation: With patient Time For Goal Achievement: 02/11/19 Potential to Achieve Goals: Good Progress  towards PT goals: Progressing toward goals    Frequency    7X/week      PT Plan Current plan remains appropriate    Co-evaluation              AM-PAC PT "6 Clicks" Mobility   Outcome Measure  Help needed turning from your back to your side while in a flat bed without using bedrails?: A Little Help needed moving from lying on your back to sitting on the side of a flat bed without using bedrails?: A Little Help needed moving to and from a bed to a chair (including a wheelchair)?: A Little Help needed standing up from a chair using your arms (e.g., wheelchair or bedside chair)?: A Little Help needed to walk in hospital room?: A Little Help needed climbing 3-5 steps with a  railing? : A Lot 6 Click Score: 17    End of Session Equipment Utilized During Treatment: Gait belt Activity Tolerance: Patient tolerated treatment well Patient left: in bed;with call bell/phone within reach Nurse Communication: Mobility status PT Visit Diagnosis: Difficulty in walking, not elsewhere classified (R26.2)     Time: 6160-7371 PT Time Calculation (min) (ACUTE ONLY): 31 min  Charges:  $Gait Training: 23-37 mins $Therapeutic Exercise: 8-22 mins                     [place] Pager [phone] Office [phone]    Lynzy Rawles 02/06/2019, 2:30 PM

## 2019-02-06 NOTE — Plan of Care (Signed)
  Problem: Health Behavior/Discharge Planning: Goal: Ability to manage health-related needs will improve Outcome: Progressing   Problem: Clinical Measurements: Goal: Ability to maintain clinical measurements within normal limits will improve Outcome: Progressing Goal: Will remain free from infection Outcome: Progressing Goal: Diagnostic test results will improve Outcome: Progressing   

## 2019-02-06 NOTE — Progress Notes (Signed)
Subjective:  Patient reports pain as mild to moderate.  Denies N/V/CP/SOB. No c/o.  Waiting to get up with PT.  Objective:   VITALS:   Vitals:   02/05/19 2124 02/05/19 2143 02/06/19 0629 02/06/19 0851  BP:  105/73 103/65   Pulse:  88 74   Resp:  15 15   Temp:  98.9 F (37.2 C) 99.8 F (37.7 C)   TempSrc:  Oral    SpO2: 93% 98% 95% 97%  Weight:      Height:        NAD ABD soft Sensation intact distally Intact pulses distally Dorsiflexion/Plantar flexion intact Incision: dressing C/D/I Compartment soft Able to SLR    Lab Results  Component Value Date   WBC 5.8 02/06/2019   HGB 6.3 (LL) 02/06/2019   HCT 22.2 (L) 02/06/2019   MCV 93.3 02/06/2019   PLT 132 (L) 02/06/2019   BMET    Component Value Date/Time   NA 138 02/04/2019 0505   NA 143 07/08/2018 0946   K 3.9 02/04/2019 0505   CL 109 02/04/2019 0505   CO2 26 02/04/2019 0505   GLUCOSE 117 (H) 02/04/2019 0505   BUN 12 02/04/2019 0505   BUN 16 07/08/2018 0946   CREATININE 0.61 02/04/2019 0505   CALCIUM 7.6 (L) 02/04/2019 0505   GFRNONAA >60 02/04/2019 0505   GFRAA >60 02/04/2019 0505     Recent Results (from the past 240 hour(s))  Surgical pcr screen     Status: Abnormal   Collection Time: 01/27/19 10:43 AM  Result Value Ref Range Status   MRSA, PCR POSITIVE (A) NEGATIVE Final    Comment: RESULT CALLED TO, READ BACK BY AND VERIFIED WITH: MOORE,M. RN @1308  ON 02.27.2020 BY COHEN,K    Staphylococcus aureus POSITIVE (A) NEGATIVE Final    Comment: (NOTE) The Xpert SA Assay (FDA approved for NASAL specimens in patients 43 years of age and older), is one component of a comprehensive surveillance program. It is not intended to diagnose infection nor to guide or monitor treatment. Performed at [facility], [place] [address], [place], [place] [address]   Aerobic/Anaerobic Culture (surgical/deep wound)     Status: None (Preliminary result)   Collection Time: 02/03/19 11:15 AM    Result Value Ref Range Status   Specimen Description   Final    TISSUE LEFT KNEE Performed at [facility] [address], [place], [place] [address]    Special Requests   Final    PATIENT ON FOLLOWING ANCEF,VANCOMYCIN Performed at [facility], [place] [address], [place], [place] [address]    Gram Stain   Final    RARE WBC PRESENT,BOTH PMN AND MONONUCLEAR NO ORGANISMS SEEN Gram Stain Report Called to,Read Back By and Verified With: HOUSE, J. RN @1208  ON 3.5.2020 BY [facility] Performed at [facility], [place] [address] [place], [place] [address]    Culture   Final    NO GROWTH 2 DAYS NO ANAEROBES ISOLATED; CULTURE IN PROGRESS FOR 5 DAYS Performed at [facility], [place] [address], [place], [place] [address]    Report Status PENDING  Incomplete     Assessment/Plan: 3 Days Post-Op   Principal Problem:   Failed total knee, left ([place]) Active Problems:   Failed total knee, left, initial encounter ([place])   TDWB with walker DVT ppx: apixaban, SCDs, TEDS PO pain control ABLA: asymptomatic, below 7.0 this, will transfuse today.  PT/OT Dispo: follow intraop culture,  D/C home with OPPT vs HHPT today  If feeling well after pRBCs.  Nicholes Stairs 02/06/2019, 10:08 AM    [facility] is now [facility] [address] [place], [place], [place] [address] Phone: [phone] [email or web address] Facebook  [employer]

## 2019-02-06 NOTE — Progress Notes (Signed)
Pt stable with no needs at time of rounding. No changes to note in pt overall condition.

## 2019-02-06 NOTE — Progress Notes (Signed)
OT Cancellation Note  Patient Details Name: [name] MRN: [id number] DOB: 17-Dec-1940   Cancelled Treatment:     PATIENT WITH LOW HB AND IS AWAITING 2 BLOOD TRANSFUSIONS. CX TREATMENT  Stellarose Cerny 02/06/2019, 8:45 AM

## 2019-02-06 NOTE — Progress Notes (Signed)
Physical Therapy Treatment Patient Details Name: [name] MRN: [id number] DOB: July 10, 1941 Today's Date: 02/06/2019    History of Present Illness s/p L TKR revision and hx of RSD, CAD, COPD, and Bil TKR    PT Comments    Pt performed therex program with assist; OOB deferred to after first unit of blood - pt Hgb 6.3   Follow Up Recommendations  Follow surgeon's recommendation for DC plan and follow-up therapies;Home health PT     Equipment Recommendations  None recommended by PT    Recommendations for Other Services       Precautions / Restrictions Precautions Precautions: Knee;Fall Restrictions Weight Bearing Restrictions: Yes LLE Weight Bearing: Partial weight bearing LLE Partial Weight Bearing Percentage or Pounds: 30%    Mobility  Bed Mobility               General bed mobility comments: OOB deferred to after first unit of blood  Transfers                    Ambulation/Gait                 Stairs             Wheelchair Mobility    Modified Rankin (Stroke Patients Only)       Balance                                            Cognition Arousal/Alertness: Awake/alert Behavior During Therapy: WFL for tasks assessed/performed Overall Cognitive Status: Within Functional Limits for tasks assessed                                        Exercises Total Joint Exercises Ankle Circles/Pumps: AROM;Both;15 reps;Supine Quad Sets: AROM;Both;Supine;15 reps Heel Slides: AAROM;Left;Supine;20 reps Straight Leg Raises: AAROM;Left;Supine;20 reps Goniometric ROM: AAROM R knee - 8 - 50    General Comments        Pertinent Vitals/Pain Pain Assessment: 0-10 Pain Score: 5  Pain Location: L knee Pain Descriptors / Indicators: Aching;Sore Pain Intervention(s): Limited activity within patient's tolerance;Monitored during session;Premedicated before session;Ice applied    Home Living                       Prior Function            PT Goals (current goals can now be found in the care plan section) Acute Rehab PT Goals Patient Stated Goal: Regain IND PT Goal Formulation: With patient Time For Goal Achievement: 02/11/19 Potential to Achieve Goals: Good Progress towards PT goals: Progressing toward goals    Frequency    7X/week      PT Plan Current plan remains appropriate    Co-evaluation              AM-PAC PT "6 Clicks" Mobility   Outcome Measure  Help needed turning from your back to your side while in a flat bed without using bedrails?: A Little Help needed moving from lying on your back to sitting on the side of a flat bed without using bedrails?: A Lot Help needed moving to and from a bed to a chair (including a wheelchair)?: A Lot Help needed standing up from a chair using your arms (e.g.,  wheelchair or bedside chair)?: A Little Help needed to walk in hospital room?: A Little Help needed climbing 3-5 steps with a railing? : A Lot 6 Click Score: 15    End of Session Equipment Utilized During Treatment: Gait belt Activity Tolerance: Patient tolerated treatment well Patient left: in bed;with call bell/phone within reach;with family/visitor present Nurse Communication: Mobility status PT Visit Diagnosis: Difficulty in walking, not elsewhere classified (R26.2)     Time: 1610-9604 PT Time Calculation (min) (ACUTE ONLY): 17 min  Charges:  $Therapeutic Exercise: 8-22 mins                     [place] Pager [phone] Office [phone]    Secilia Apps 02/06/2019, 12:17 PM

## 2019-02-07 LAB — TYPE AND SCREEN
ABO/RH(D): A POS
Antibody Screen: NEGATIVE
Unit division: 0
Unit division: 0

## 2019-02-07 LAB — BPAM RBC
Blood Product Expiration Date: 202003252359
Blood Product Expiration Date: 202003252359
ISSUE DATE / TIME: 202003081027
ISSUE DATE / TIME: 202003081531
Unit Type and Rh: 6200
Unit Type and Rh: 6200

## 2019-02-07 LAB — HEMOGLOBIN AND HEMATOCRIT, BLOOD
HCT: 27.1 % — ABNORMAL LOW (ref 36.0–46.0)
Hemoglobin: 8.3 g/dL — ABNORMAL LOW (ref 12.0–15.0)

## 2019-02-07 NOTE — Progress Notes (Signed)
Pt alert, oriented, tolerating diet.  D/C instructions given and all questions answered. Pt was d/cd home.

## 2019-02-07 NOTE — Progress Notes (Signed)
Occupational Therapy Treatment Patient Details Name: [name] MRN: [id number] DOB: [birth year] Today's Date: 02/07/2019    History of present illness s/p L TKR revision and hx of RSD, CAD, COPD, and Bil TKR   OT comments  OT education complete with caregiver.  Follow Up Recommendations  Supervision/Assistance - 24 hour    Equipment Recommendations  3 in 1 bedside commode    Recommendations for Other Services      Precautions / Restrictions Precautions Precautions: Knee;Fall Restrictions Weight Bearing Restrictions: Yes LLE Weight Bearing: Partial weight bearing LLE Partial Weight Bearing Percentage or Pounds: 30% Other Position/Activity Restrictions: Per Dr Lyla Glassing verbally - pt can increased WB to manage stairs        Mobility Bed Mobility Overal bed mobility: Modified Independent Bed Mobility: Supine to Sit     Supine to sit: Modified independent (Device/Increase time);HOB elevated     General bed mobility comments: increased time, HOB up, no physical assist  Transfers Overall transfer level: Needs assistance Equipment used: Rolling walker (2 wheeled) Transfers: Sit to/from [facility] Sit to Stand: Supervision         General transfer comment:  Cues for UE/LE placement.     Balance Overall balance assessment: Mild deficits observed, not formally tested                                         ADL either performed or assessed with clinical judgement   ADL Overall ADL's : Needs assistance/impaired     Grooming: Standing;Set up   Upper Body Bathing: Set up;Sitting   Lower Body Bathing: Minimal assistance;Sit to/from stand;Cueing for sequencing;Cueing for safety   Upper Body Dressing : Set up;Sitting   Lower Body Dressing: Minimal assistance;Sit to/from stand;Cueing for sequencing;Cueing for safety   Toilet Transfer: Min guard;Comfort height toilet;RW   Toileting- Clothing Manipulation and Hygiene: Minimal  assistance;Sit to/from stand;Cueing for sequencing;Cueing for safety     Tub/Shower Transfer Details (indicate cue type and reason): defer to [facility] Functional mobility during ADLs: Rolling walker;Minimal assistance       Vision Patient Visual Report: No change from baseline            Cognition Arousal/Alertness: Awake/alert Behavior During Therapy: WFL for tasks assessed/performed Overall Cognitive Status: Within Functional Limits for tasks assessed                                                     Pertinent Vitals/ Pain       Pain Score: 5  Pain Location: L knee Pain Descriptors / Indicators: Sore Pain Intervention(s): Limited activity within patient's tolerance;Repositioned     Prior Functioning/Environment              Frequency  Min 2X/week        Progress Toward Goals  OT Goals(current goals can now be found in the care plan section)  Progress towards OT goals: Progressing toward goals  Acute Rehab OT Goals Patient Stated Goal: go to water aerobics at [facility] Discharge plan remains appropriate       AM-PAC OT "6 Clicks" Daily Activity     Outcome Measure   Help from another person eating meals?: None Help from another person taking care  of personal grooming?: A Little Help from another person toileting, which includes using toliet, bedpan, or urinal?: A Little Help from another person bathing (including washing, rinsing, drying)?: A Little Help from another person to put on and taking off regular upper body clothing?: A Little Help from another person to put on and taking off regular lower body clothing?: A Lot 6 Click Score: 18    End of Session Equipment Utilized During Treatment: Rolling walker  OT Visit Diagnosis: Unsteadiness on feet (R26.81)   Activity Tolerance Patient limited by fatigue   Patient Left in chair;with call bell/phone within reach;with nursing/sitter in room   Nurse Communication          Time:  0258-5277 OT Time Calculation (min): 25 min  Charges: OT General Charges $OT Visit: 1 Visit OT Treatments $Self Care/Home Management : 23-37 mins $Therapeutic Activity: 8-22 mins  Kari Baars, OT Acute Rehabilitation Services [phone] Office- [phone]      Madelon Welsch, Edwena Felty D 02/07/2019, 1:59 PM

## 2019-02-07 NOTE — Progress Notes (Signed)
Physical Therapy Treatment Patient Details Name: [name] MRN: [id number] DOB: 12-25-1940 Today's Date: 02/07/2019    History of Present Illness s/p L TKR revision and hx of RSD, CAD, COPD, and Bil TKR    PT Comments    Pt progressing well with mobility, stair training completed, instruction in TKA HEP completed. Pt is ready to DC home from PT standpoint. Pt expressed concern that her caregiver recently had a heart attack and wondered if she needed to DC to SNF. Pt does not need physical assist for mobility, other than carrying RW up and down stairs to enter/exit home for pt. Pt and CG felt they could manage this at home.    Follow Up Recommendations  Follow surgeon's recommendation for DC plan and follow-up therapies;Home health PT     Equipment Recommendations  None recommended by PT    Recommendations for Other Services       Precautions / Restrictions Precautions Precautions: Knee;Fall Restrictions Weight Bearing Restrictions: Yes LLE Weight Bearing: Partial weight bearing LLE Partial Weight Bearing Percentage or Pounds: 30% Other Position/Activity Restrictions: Per Dr Lyla Glassing verbally - pt can increased WB to manage stairs     Mobility  Bed Mobility Overal bed mobility: Modified Independent Bed Mobility: Supine to Sit     Supine to sit: Modified independent (Device/Increase time);HOB elevated     General bed mobility comments: increased time, HOB up, no physical assist  Transfers Overall transfer level: Needs assistance Equipment used: Rolling walker (2 wheeled) Transfers: Sit to/from Stand Sit to Stand: Supervision         General transfer comment:  Cues for UE/LE placement.   Ambulation/Gait Ambulation/Gait assistance: Supervision Gait Distance (Feet): 70 Feet Assistive device: Rolling walker (2 wheeled) Gait Pattern/deviations: Step-to pattern;Decreased step length - right;Decreased step length - left;Shuffle;Trunk flexed Gait velocity: decr    General Gait Details: cues for posture, position from RW, sequence and UE WB to insure PWB   Stairs Stairs: Yes Stairs assistance: Min guard Stair Management: Two rails;Forwards;Step to pattern Number of Stairs: 3 General stair comments: VCs sequencing, CG Sunday Spillers present   Wheelchair Mobility    Modified Rankin (Stroke Patients Only)       Balance Overall balance assessment: Mild deficits observed, not formally tested                                          Cognition Arousal/Alertness: Awake/alert Behavior During Therapy: WFL for tasks assessed/performed Overall Cognitive Status: Within Functional Limits for tasks assessed                                        Exercises Total Joint Exercises Ankle Circles/Pumps: AROM;Both;15 reps;Supine Quad Sets: AROM;Both;Supine;15 reps Short Arc Quad: AROM;Left;10 reps;Supine Heel Slides: AAROM;Left;Supine;10 reps Hip ABduction/ADduction: AROM;Left;10 reps;Supine Straight Leg Raises: AAROM;Left;Supine;AROM;10 reps Long Arc Quad: AROM;Left;10 reps;Seated Knee Flexion: AROM;AAROM;Left;10 reps;Seated Goniometric ROM: 5-65* AAROM L knee    General Comments        Pertinent Vitals/Pain Pain Score: 5  Pain Location: L knee Pain Descriptors / Indicators: Sore Pain Intervention(s): Limited activity within patient's tolerance;Monitored during session;Premedicated before session;Ice applied    Home Living                      Prior Function  PT Goals (current goals can now be found in the care plan section) Acute Rehab PT Goals Patient Stated Goal[facility] Goal Formulation: With patient/family Time For Goal Achievement: 02/11/19 Potential to Achieve Goals: Good Progress towards PT goals: Progressing toward goals    Frequency    7X/week      PT Plan Current plan remains appropriate    Co-evaluation              AM-PAC PT "6 Clicks"  Mobility   Outcome Measure  Help needed turning from your back to your side while in a flat bed without using bedrails?: None Help needed moving from lying on your back to sitting on the side of a flat bed without using bedrails?: A Little Help needed moving to and from a bed to a chair (including a wheelchair)?: None Help needed standing up from a chair using your arms (e.g., wheelchair or bedside chair)?: None Help needed to walk in hospital room?: None Help needed climbing 3-5 steps with a railing? : A Little 6 Click Score: 22    End of Session Equipment Utilized During Treatment: Gait belt Activity Tolerance: Patient tolerated treatment well Patient left: in bed;with call bell/phone within reach;in chair;with family/visitor present Nurse Communication: Mobility status PT Visit Diagnosis: Difficulty in walking, not elsewhere classified (R26.2)     Time: 0165-5374 PT Time Calculation (min) (ACUTE ONLY): 56 min  Charges:  $Gait Training: 23-37 mins $Therapeutic Exercise: 8-22 mins $Therapeutic Activity: 8-22 mins                     Blondell Reveal Kistler PT 02/07/2019  Acute Rehabilitation Services Pager [phone] Office [phone]

## 2019-02-07 NOTE — Care Management Note (Signed)
Case Management Note  Patient Details  Name: STEPHAINE BRESHEARS MRN: [id number] Date of Birth: [birth year]  Subjective/Objective: PT recc HH vs otpt PT. Patient wants otpt PT-attending will arrange. Already has rw. [employer] rep Brenton Grills will deliver 3n1 to rm prior d/c.  No further CM needs.                  Action/Plan:d/c home w/otpt PT/dme-3n1.   Expected Discharge Date:  02/07/19               Expected Discharge Plan:  OP Rehab  In-House Referral:  NA  Discharge planning Services  CM Consult  Post Acute Care Choice:    Choice offered to:  Patient  DME Arranged:  3-N-1 DME Agency:  [facility]  HH Arranged:    [place] Agency:     Status of Service:  Completed, signed off  If discussed at [place] of Stay Meetings, dates discussed:    Additional Comments:  Dessa Phi, RN 02/07/2019, 10:46 AM

## 2019-02-08 LAB — AEROBIC/ANAEROBIC CULTURE W GRAM STAIN (SURGICAL/DEEP WOUND): Culture: NO GROWTH

## 2019-02-09 ENCOUNTER — Other Ambulatory Visit: Payer: Self-pay | Admitting: Internal Medicine

## 2019-02-10 ENCOUNTER — Other Ambulatory Visit: Payer: Self-pay

## 2019-02-10 ENCOUNTER — Ambulatory Visit (HOSPITAL_COMMUNITY)
Admission: RE | Admit: 2019-02-10 | Discharge: 2019-02-10 | Disposition: A | Discharge status: Home / Self Care | Payer: BC Managed Care – PPO | Source: Ambulatory Visit | Attending: Orthopedic Surgery | Admitting: Orthopedic Surgery

## 2019-02-10 ENCOUNTER — Other Ambulatory Visit (HOSPITAL_COMMUNITY): Payer: Self-pay | Admitting: Orthopedic Surgery

## 2019-02-10 ENCOUNTER — Encounter (HOSPITAL_COMMUNITY): Payer: Self-pay | Admitting: Orthopedic Surgery

## 2019-02-10 DIAGNOSIS — M7989 Other specified soft tissue disorders: Principal | ICD-10-CM

## 2019-02-10 DIAGNOSIS — M25662 Stiffness of left knee, not elsewhere classified: Secondary | ICD-10-CM | POA: Insufficient documentation

## 2019-02-10 DIAGNOSIS — M79605 Pain in left leg: Secondary | ICD-10-CM | POA: Diagnosis not present

## 2019-02-10 NOTE — Progress Notes (Signed)
Left lower extremity venous duplex completed. Preliminary results in Chart review CV Proc. [facility], RVS 02/10/2019 1:43 PM

## 2019-02-15 ENCOUNTER — Ambulatory Visit: Payer: BC Managed Care – PPO | Admitting: Podiatry

## 2019-03-13 ENCOUNTER — Other Ambulatory Visit: Payer: Self-pay | Admitting: Internal Medicine

## 2019-03-22 ENCOUNTER — Other Ambulatory Visit (HOSPITAL_COMMUNITY): Payer: Self-pay | Admitting: Orthopedic Surgery

## 2019-03-22 ENCOUNTER — Ambulatory Visit (HOSPITAL_COMMUNITY)
Admission: RE | Admit: 2019-03-22 | Discharge: 2019-03-22 | Disposition: A | Discharge status: Home / Self Care | Payer: BC Managed Care – PPO | Source: Ambulatory Visit | Attending: Cardiovascular Disease | Admitting: Cardiovascular Disease

## 2019-03-22 ENCOUNTER — Other Ambulatory Visit: Payer: Self-pay

## 2019-03-22 DIAGNOSIS — M79662 Pain in left lower leg: Secondary | ICD-10-CM

## 2019-03-22 DIAGNOSIS — M7989 Other specified soft tissue disorders: Secondary | ICD-10-CM

## 2019-03-22 DIAGNOSIS — M79605 Pain in left leg: Secondary | ICD-10-CM

## 2019-04-08 ENCOUNTER — Ambulatory Visit: Payer: BC Managed Care – PPO | Admitting: Internal Medicine

## 2019-04-08 ENCOUNTER — Other Ambulatory Visit: Payer: Self-pay | Admitting: Podiatry

## 2019-04-08 ENCOUNTER — Other Ambulatory Visit (INDEPENDENT_AMBULATORY_CARE_PROVIDER_SITE_OTHER): Payer: BC Managed Care – PPO

## 2019-04-08 ENCOUNTER — Ambulatory Visit: Payer: BC Managed Care – PPO | Admitting: Podiatry

## 2019-04-08 ENCOUNTER — Ambulatory Visit (INDEPENDENT_AMBULATORY_CARE_PROVIDER_SITE_OTHER): Payer: BC Managed Care – PPO

## 2019-04-08 ENCOUNTER — Other Ambulatory Visit: Payer: Self-pay

## 2019-04-08 ENCOUNTER — Encounter: Payer: Self-pay | Admitting: Internal Medicine

## 2019-04-08 ENCOUNTER — Telehealth: Payer: Self-pay

## 2019-04-08 ENCOUNTER — Encounter: Payer: Self-pay | Admitting: Podiatry

## 2019-04-08 VITALS — Temp 97.2°F

## 2019-04-08 VITALS — BP 140/90 | HR 61 | Temp 98.5°F | Ht 64.0 in | Wt 224.0 lb

## 2019-04-08 DIAGNOSIS — Z0001 Encounter for general adult medical examination with abnormal findings: Secondary | ICD-10-CM

## 2019-04-08 DIAGNOSIS — E611 Iron deficiency: Secondary | ICD-10-CM

## 2019-04-08 DIAGNOSIS — D649 Anemia, unspecified: Secondary | ICD-10-CM | POA: Diagnosis not present

## 2019-04-08 DIAGNOSIS — R42 Dizziness and giddiness: Secondary | ICD-10-CM | POA: Insufficient documentation

## 2019-04-08 DIAGNOSIS — E538 Deficiency of other specified B group vitamins: Secondary | ICD-10-CM | POA: Diagnosis not present

## 2019-04-08 DIAGNOSIS — M722 Plantar fascial fibromatosis: Secondary | ICD-10-CM

## 2019-04-08 DIAGNOSIS — B351 Tinea unguium: Secondary | ICD-10-CM | POA: Diagnosis not present

## 2019-04-08 DIAGNOSIS — L84 Corns and callosities: Secondary | ICD-10-CM

## 2019-04-08 DIAGNOSIS — M79675 Pain in left toe(s): Secondary | ICD-10-CM

## 2019-04-08 DIAGNOSIS — E559 Vitamin D deficiency, unspecified: Secondary | ICD-10-CM | POA: Diagnosis not present

## 2019-04-08 DIAGNOSIS — Z89422 Acquired absence of other left toe(s): Secondary | ICD-10-CM | POA: Diagnosis not present

## 2019-04-08 DIAGNOSIS — M79674 Pain in right toe(s): Secondary | ICD-10-CM | POA: Diagnosis not present

## 2019-04-08 DIAGNOSIS — M79672 Pain in left foot: Secondary | ICD-10-CM

## 2019-04-08 DIAGNOSIS — Z0000 Encounter for general adult medical examination without abnormal findings: Secondary | ICD-10-CM

## 2019-04-08 LAB — TSH: TSH: 1.13 u[IU]/mL (ref 0.35–4.50)

## 2019-04-08 LAB — HEPATIC FUNCTION PANEL
ALT: 8 U/L (ref 0–35)
AST: 13 U/L (ref 0–37)
Albumin: 4.1 g/dL (ref 3.5–5.2)
Alkaline Phosphatase: 100 U/L (ref 39–117)
Bilirubin, Direct: 0.1 mg/dL (ref 0.0–0.3)
Total Bilirubin: 0.4 mg/dL (ref 0.2–1.2)
Total Protein: 7.5 g/dL (ref 6.0–8.3)

## 2019-04-08 LAB — URINALYSIS, ROUTINE W REFLEX MICROSCOPIC
Bilirubin Urine: NEGATIVE
Hgb urine dipstick: NEGATIVE
Ketones, ur: NEGATIVE
Leukocytes,Ua: NEGATIVE
Nitrite: NEGATIVE
RBC / HPF: NONE SEEN (ref 0–?)
Specific Gravity, Urine: 1.015 (ref 1.000–1.030)
Total Protein, Urine: NEGATIVE
Urine Glucose: NEGATIVE
Urobilinogen, UA: 0.2 (ref 0.0–1.0)
pH: 8 (ref 5.0–8.0)

## 2019-04-08 LAB — BASIC METABOLIC PANEL
BUN: 12 mg/dL (ref 6–23)
CO2: 28 mEq/L (ref 19–32)
Calcium: 9.2 mg/dL (ref 8.4–10.5)
Chloride: 104 mEq/L (ref 96–112)
Creatinine, Ser: 0.7 mg/dL (ref 0.40–1.20)
GFR: 98.38 mL/min (ref >60.00)
Glucose, Bld: 107 mg/dL — ABNORMAL HIGH (ref 70–99)
Potassium: 4.3 mEq/L (ref 3.5–5.1)
Sodium: 140 mEq/L (ref 135–145)

## 2019-04-08 LAB — CBC WITH DIFFERENTIAL/PLATELET
Basophils Absolute: 0 10*3/uL (ref 0.0–0.1)
Basophils Relative: 0.5 % (ref 0.0–3.0)
Eosinophils Absolute: 0 10*3/uL (ref 0.0–0.7)
Eosinophils Relative: 0 % (ref 0.0–5.0)
HCT: 35.7 % — ABNORMAL LOW (ref 36.0–46.0)
Hemoglobin: 11.6 g/dL — ABNORMAL LOW (ref 12.0–15.0)
Lymphocytes Relative: 15.9 % (ref 12.0–46.0)
Lymphs Abs: 0.6 10*3/uL — ABNORMAL LOW (ref 0.7–4.0)
MCHC: 32.4 g/dL (ref 30.0–36.0)
MCV: 80.3 fl (ref 78.0–100.0)
Monocytes Absolute: 0 10*3/uL — ABNORMAL LOW (ref 0.1–1.0)
Monocytes Relative: 1.2 % — ABNORMAL LOW (ref 3.0–12.0)
Neutro Abs: 3.3 10*3/uL (ref 1.4–7.7)
Neutrophils Relative %: 82.4 % — ABNORMAL HIGH (ref 43.0–77.0)
Platelets: 204 10*3/uL (ref 150.0–400.0)
RBC: 4.44 Mil/uL (ref 3.87–5.11)
RDW: 15.1 % (ref 11.5–15.5)
WBC: 4 10*3/uL (ref 4.0–10.5)

## 2019-04-08 LAB — LIPID PANEL
Cholesterol: 144 mg/dL (ref 0–200)
HDL: 63.4 mg/dL (ref >39.00)
LDL Cholesterol: 63 mg/dL (ref 0–99)
NonHDL: 80.65
Total CHOL/HDL Ratio: 2
Triglycerides: 90 mg/dL (ref 0.0–149.0)
VLDL: 18 mg/dL (ref 0.0–40.0)

## 2019-04-08 LAB — IBC PANEL
Iron: 45 ug/dL (ref 42–145)
Saturation Ratios: 13.4 % — ABNORMAL LOW (ref 20.0–50.0)
Transferrin: 239 mg/dL (ref 212.0–360.0)

## 2019-04-08 LAB — VITAMIN D 25 HYDROXY (VIT D DEFICIENCY, FRACTURES): VITD: 19.31 ng/mL — ABNORMAL LOW (ref 30.00–100.00)

## 2019-04-08 LAB — FERRITIN: Ferritin: 151.2 ng/mL (ref 10.0–291.0)

## 2019-04-08 LAB — VITAMIN B12: Vitamin B-12: 267 pg/mL (ref 211–911)

## 2019-04-08 MED ORDER — MECLIZINE HCL 12.5 MG PO TABS: 12.5000 mg | ORAL_TABLET | Freq: Three times a day (TID) | ORAL | 2 refills | Status: AC | PRN

## 2019-04-08 NOTE — Telephone Encounter (Signed)
OK for inperson ROV this afternoon if ok with patient

## 2019-04-08 NOTE — Telephone Encounter (Signed)
Called pt, LVM to schedule OV with PCP.

## 2019-04-08 NOTE — Patient Instructions (Signed)
Please take all new medication as prescribed - the meclizine as needed for dizziness  Please continue all other medications as before, and refills have been done if requested.  Please have the pharmacy call with any other refills you may need.  Please continue your efforts at being more active, low cholesterol diet, and weight control.  You are otherwise up to date with prevention measures today.  Please keep your appointments with your specialists as you may have planned  Please go to the LAB in the Basement (turn left off the elevator) for the tests to be done today  You will be contacted by phone if any changes need to be made immediately.  Otherwise, you will receive a letter about your results with an explanation, but please check with MyChart first.  Please remember to sign up for MyChart if you have not done so, as this will be important to you in the future with finding out test results, communicating by private email, and scheduling acute appointments online when needed.  Please return in 6 months, or sooner if needed

## 2019-04-08 NOTE — Patient Instructions (Addendum)
Plantar Fasciitis    Plantar fasciitis is a painful foot condition that affects the heel. It occurs when the band of tissue that connects the toes to the heel bone (plantar fascia) becomes irritated. This can happen as the result of exercising too much or doing other repetitive activities (overuse injury).  The pain from plantar fasciitis can range from mild irritation to severe pain that makes it difficult to walk or move. The pain is usually worse in the morning after sleeping, or after sitting or lying down for a while. Pain may also be worse after long periods of walking or standing.  What are the causes?  This condition may be caused by:   Standing for long periods of time.   Wearing shoes that do not have good arch support.   Doing activities that put stress on joints (high-impact activities), including running, aerobics, and ballet.   Being overweight.   An abnormal way of walking (gait).   Tight muscles in the back of your lower leg (calf).   High arches in your feet.   Starting a new athletic activity.  What are the signs or symptoms?  The main symptom of this condition is heel pain. Pain may:   Be worse with first steps after a time of rest, especially in the morning after sleeping or after you have been sitting or lying down for a while.   Be worse after long periods of standing still.   Decrease after 30-45 minutes of activity, such as gentle walking.  How is this diagnosed?  This condition may be diagnosed based on your medical history and your symptoms. Your health care provider may ask questions about your activity level. Your health care provider will do a physical exam to check for:   A tender area on the bottom of your foot.   A high arch in your foot.   Pain when you move your foot.   Difficulty moving your foot.  You may have imaging tests to confirm the diagnosis, such as:   X-rays.   Ultrasound.   MRI.  How is this treated?  Treatment for plantar fasciitis depends on how  severe your condition is. Treatment may include:   Rest, ice, applying pressure (compression), and raising the affected foot (elevation). This may be called RICE therapy. Your health care provider may recommend RICE therapy along with over-the-counter pain medicines to manage your pain.   Exercises to stretch your calves and your plantar fascia.   A splint that holds your foot in a stretched, upward position while you sleep (night splint).   Physical therapy to relieve symptoms and prevent problems in the future.   Injections of steroid medicine (cortisone) to relieve pain and inflammation.   Stimulating your plantar fascia with electrical impulses (extracorporeal shock wave therapy). This is usually the last treatment option before surgery.   Surgery, if other treatments have not worked after 12 months.  Follow these instructions at home:    Managing pain, stiffness, and swelling   If directed, put ice on the painful area:  ? Put ice in a plastic bag, or use a frozen bottle of water.  ? Place a towel between your skin and the bag or bottle.  ? Roll the bottom of your foot over the bag or bottle.  ? Do this for 20 minutes, 2-3 times a day.   Wear athletic shoes that have air-sole or gel-sole cushions, or try wearing soft shoe inserts that are designed for plantar   fasciitis.   Raise (elevate) your foot above the level of your heart while you are sitting or lying down.  Activity   Avoid activities that cause pain. Ask your health care provider what activities are safe for you.   Do physical therapy exercises and stretches as told by your health care provider.   Try activities and forms of exercise that are easier on your joints (low-impact). Examples include swimming, water aerobics, and biking.  General instructions   Take over-the-counter and prescription medicines only as told by your health care provider.   Wear a night splint while sleeping, if told by your health care provider. Loosen the splint  if your toes tingle, become numb, or turn cold and blue.   Maintain a healthy weight, or work with your health care provider to lose weight as needed.   Keep all follow-up visits as told by your health care provider. This is important.  Contact a health care provider if you:   Have symptoms that do not go away after caring for yourself at home.   Have pain that gets worse.   Have pain that affects your ability to move or do your daily activities.  Summary   Plantar fasciitis is a painful foot condition that affects the heel. It occurs when the band of tissue that connects the toes to the heel bone (plantar fascia) becomes irritated.   The main symptom of this condition is heel pain that may be worse after exercising too much or standing still for a long time.   Treatment varies, but it usually starts with rest, ice, compression, and elevation (RICE therapy) and over-the-counter medicines to manage pain.  This information is not intended to replace advice given to you by your health care provider. Make sure you discuss any questions you have with your health care provider.  Document Released: 08/12/2001 Document Revised: 09/14/2017 Document Reviewed: 09/14/2017  Elsevier Interactive Patient Education  2019 [employer]        Plantar Fasciitis Rehab  Ask your health care provider which exercises are safe for you. Do exercises exactly as told by your health care provider and adjust them as directed. It is normal to feel mild stretching, pulling, tightness, or discomfort as you do these exercises, but you should stop right away if you feel sudden pain or your pain gets worse. Do not begin these exercises until told by your health care provider.  Stretching and range of motion exercises  These exercises warm up your muscles and joints and improve the movement and flexibility of your foot. These exercises also help to relieve pain.  Exercise A: Plantar fascia stretch    1. Sit with your left / right leg crossed  over your opposite knee.  2. Hold your heel with one hand with that thumb near your arch. With your other hand, hold your toes and gently pull them back toward the top of your foot. You should feel a stretch on the bottom of your toes or your foot or both.  3. Hold this stretch for__________ seconds.  4. Slowly release your toes and return to the starting position.  Repeat __________ times. Complete this exercise __________ times a day.  Exercise B: Gastroc, standing    1. Stand with your hands against a wall.  2. Extend your left / right leg behind you, and bend your front knee slightly.  3. Keeping your heels on the floor and keeping your back knee straight, shift your weight toward the   wall without arching your back. You should feel a gentle stretch in your left / right calf.  4. Hold this position for __________ seconds.  Repeat __________ times. Complete this exercise __________ times a day.  Exercise C: Soleus, standing  1. Stand with your hands against a wall.  2. Extend your left / right leg behind you, and bend your front knee slightly.  3. Keeping your heels on the floor, bend your back knee and slightly shift your weight over the back leg. You should feel a gentle stretch deep in your calf.  4. Hold this position for __________ seconds.  Repeat __________ times. Complete this exercise __________ times a day.  Exercise D: Gastrocsoleus, standing  1. Stand with the ball of your left / right foot on a step. The ball of your foot is on the walking surface, right under your toes.  2. Keep your other foot firmly on the same step.  3. Hold onto the wall or a railing for balance.  4. Slowly lift your other foot, allowing your body weight to press your heel down over the edge of the step. You should feel a stretch in your left / right calf.  5. Hold this position for __________ seconds.  6. Return both feet to the step.  7. Repeat this exercise with a slight bend in your left / right knee.  Repeat __________ times  with your left / right knee straight and __________ times with your left / right knee bent. Complete this exercise __________ times a day.  Balance exercise  This exercise builds your balance and strength control of your arch to help take pressure off your plantar fascia.  Exercise E: Single leg stand  1. Without shoes, stand near a railing or in a doorway. You may hold onto the railing or door frame as needed.  2. Stand on your left / right foot. Keep your big toe down on the floor and try to keep your arch lifted. Do not let your foot roll inward.  3. Hold this position for __________ seconds.  4. If this exercise is too easy, you can try it with your eyes closed or while standing on a pillow.  Repeat __________ times. Complete this exercise __________ times a day.  This information is not intended to replace advice given to you by your health care provider. Make sure you discuss any questions you have with your health care provider.  Document Released: 11/17/2005 Document Revised: 07/22/2016 Document Reviewed: 10/01/2015  Elsevier Interactive Patient Education  2019 [employer] Inc[employer]

## 2019-04-08 NOTE — Telephone Encounter (Signed)
Copied from [id number] [phone]. Topic: Appointment Scheduling - Scheduling Inquiry for Clinic >> Apr 08, 2019  9:40 AM Yvette Rack wrote: Reason for CRM: Pt would like to know if she can come in to the office for a blood pressure check. Pt requests a call back. Cb# [phone]

## 2019-04-08 NOTE — Progress Notes (Signed)
SUBJECTIVE: [name] presents to clinic on today for follow up painful onychomycosis b/l feet.  Today, she c/o painful heel left foot. She has had knee cap replacement in March and states her heel started hurting some time after that, but has become more symptomatic within the past 2 weeks. Pain level is 7-8/10 with no relief. She has tried nothing to treat the heel pain.   OBJECTIVE:  Vascular: Capillary refill time immediate x 10 digits.  Dorsalis pedis and posterior tibial pulses palpable bilaterally.  No varicosities b/l.  Skin temperature gradient WNL b/l.  Dermatological: Normal skin turgor, texture and tone b/l.  No skin eruptions noted b/l.  Toenails 3-5 right, 1, 4, 5 left are painful, elongated, discolored, dystrophic with subungual debris. Pain with dorsal palpation of nailplates. No erythema, no edema, no drainage noted.  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 Nailbeds intact with no signs of irritation/infection.  Hyperkeratotic lesion distal tip right hallux. No erythema, no edema, no drainage, no flocculcence.  Neurological: Epicritic sensation grossly intact b/l and symmetrically with 10 gram monofilament. Vibratory sensation intact b/l.   Musculoskeletal: Muscle strength 5/5 to all LE muscle groups b/l.  Negative Tinel's sign b/l Pain on palpation noted medial tubercle left heel.  Xray findings left foot: Amputation left 2nd digit Severe bunion deformity left foot  No evidence of plantar heel spur  ASSESSMENT: 1. Painful onychomycosis x 6: 3-5 right, 1, 4, 5 2. Callux right hallux 3. Plantar fasciitis left foot  4. 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 Patient examined today. 2. Toenails debrided in length and girth 3-5 right, 1, 4, 5 without iatrogenic bleeding. 3. Xrays left foot taken and reviewed with [name]. Calluses pared distal tip right hallux utilizing sterile scalpel blade without  incident. 4. Discussed etiology, pathology, conservative vs. surgical therapies. At this time a plantar fascial injection was recommended.  The patient agreed and a sterile skin prep was applied.  An injection consisting of 1cc each Lidocaine plain, marcaine plain and 4 mg dexamethasone phosphate  was infiltrated at the point of maximal tenderness on the left heel.  The patient tolerated this well and was given instructions for aftercare.  5. Dispensed instructions for stretching exercises. 6. Recommended change in shoe gear to [employer], 600 series or higher. 7. Follow up 1 month for plantar fasciitis reassessment left foot.

## 2019-04-08 NOTE — Progress Notes (Signed)
Subjective:    Patient ID: [name], female    DOB: February 01, 1941, 78 y.o.   MRN: [id number]  HPI  Here for wellness and f/u;  Overall doing ok;  Pt denies Chest pain, worsening SOB, DOE, wheezing, orthopnea, PND, worsening LE edema, palpitations, or syncope, but has had postural dizziness first thing in the AM recently, has to get up slowly, in addition to occasional vertigo intermittent mild for 2-3 wks, without HA , ear or sinus symptoms  .  Pt denies neurological change such as new headache, facial or extremity weakness.  Pt denies polydipsia, polyuria, or low sugar symptoms. Pt states overall good compliance with treatment and medications, good tolerability, and has been trying to follow appropriate diet.  Pt denies worsening depressive symptoms, suicidal ideation or panic. No fever, night sweats, wt loss, loss of appetite, or other constitutional symptoms.  Pt states good ability with ADL's, has low fall risk, home safety reviewed and adequate, no other significant changes in hearing or vision, and only occasionally active with exercise. BP Readings from Last 3 Encounters:  04/08/19 140/90  02/07/19 119/74  01/27/19 (!) 161/88   Past Medical History:  Diagnosis Date  . Allergic rhinitis   . Arnold-Chiari malformation ([place])   . Asthma   . CAD (coronary artery disease)   . COPD (chronic obstructive pulmonary disease) ([place])   . Coughing    coughing since last 01-22-2019 , started on cefdinir bid x10 days, has 3 pills left today , reports she feels mcuh better , still coughing copiuus amonts of thick white sputum , denies fever nor chills, nor body aches .   Marland Kitchen DJD (degenerative joint disease)   . GERD (gastroesophageal reflux disease)   . History of absence seizures    last confirmed seizure age 38    . Hx of colonic polyps   . Hypercholesteremia   . Hypertension   . Obesity   . Positive PPD   . Reflex sympathetic dystrophy   . Varicose veins   . Venous insufficiency    Past  Surgical History:  Procedure Laterality Date  . ABDOMINAL HYSTERECTOMY    . cspine surgery  1993   for arnold-chiari malformation  . JOINT REPLACEMENT  06/05/11   Left total knee replacement  . right knee arthroscopy  12/2007   Dr. Tonita Cong  . right total knee replacement  05/2008   Dr. Tonita Cong  . TOTAL KNEE REVISION Left 02/03/2019   Procedure: LEFT TOTAL KNEE REVISION;  Surgeon: Rod Can, MD;  Location: [facility];  Service: Orthopedics;  Laterality: Left;    reports that she quit smoking about 7 years ago. Her smoking use included cigarettes. She has a 3.60 pack-year smoking history. She has never used smokeless tobacco. She reports that she does not drink alcohol or use drugs. family history includes Clotting disorder in her father; Heart disease in her mother; Stroke in her father and mother. No Known Allergies Current Outpatient Medications on File Prior to Visit  Medication Sig Dispense Refill  . albuterol (PROVENTIL HFA;VENTOLIN HFA) 108 (90 BASE) MCG/ACT inhaler Inhale 2 puffs into the lungs every 4 (four) hours as needed. For wheeze or shortness of breath (Patient taking differently: Inhale 2 puffs into the lungs every 4 (four) hours as needed for wheezing or shortness of breath. ) 1 Inhaler 6  . apixaban (ELIQUIS) 2.5 MG TABS tablet Take 1 tablet (2.5 mg total) by mouth every 12 (twelve) hours. 60 tablet 0  . aspirin 81  MG tablet Take 81 mg by mouth daily.      Marland Kitchen azelastine (ASTELIN) 0.1 % nasal spray Place 1 spray into both nostrils daily as needed for rhinitis or allergies.     . budesonide-formoterol (SYMBICORT) 160-4.5 MCG/ACT inhaler Inhale 2 puffs into the lungs 2 (two) times daily. 1 Inhaler 0  . cefdinir (OMNICEF) 300 MG capsule Take 300 mg by mouth 2 (two) times daily. Take 1 capsule by mouth twice a day for 10 days    . docusate sodium (COLACE) 100 MG capsule Take 1 capsule (100 mg total) by mouth 2 (two) times daily. 60 capsule 1  . EPINEPHrine 0.3 mg/0.3 mL IJ SOAJ injection  epinephrine 0.3 mg/0.3 mL injection, auto-injector    . ferrous sulfate 325 (65 FE) MG EC tablet Take 325 mg by mouth daily.    . fluticasone (FLONASE) 50 MCG/ACT nasal spray Place 1 spray into both nostrils 2 (two) times daily as needed for allergies or rhinitis.    . furosemide (LASIX) 40 MG tablet TAKE 2 TABLETS BY MOUTH DAILY. ANNUAL APPT DUE IN DEC MUST SEE PROVIDER FOR FUTURE REFILLS (Patient taking differently: Take 40-80 mg by mouth See admin instructions. Take 40 mg daily in the evening, may instead take a single 80 mg dose in the evening as needed for swelling) 180 tablet 0  . HYDROcodone-acetaminophen (NORCO/VICODIN) 5-325 MG tablet Take 1 tablet by mouth every 4 (four) hours as needed for moderate pain (pain score 4-6). 42 tablet 0  . KLOR-CON M20 20 MEQ tablet TAKE 1 TABLET BY MOUTH EVERY DAY (Patient taking differently: Take 20 mEq by mouth every evening. ) 90 tablet 1  . meloxicam (MOBIC) 15 MG tablet TAKE 1 TABLET BY MOUTH EVERY DAY AS NEEDED FOR PAIN    . Menthol (ICY HOT BACK EXTRA STRENGTH) 5 % PTCH Apply 1 patch topically daily as needed (pain).    . montelukast (SINGULAIR) 10 MG tablet Take 10 mg by mouth every evening.    . mupirocin ointment (BACTROBAN) 2 % USE IN BOTH NOSTRILS TWICE A DAY FOR 5 DAYS    . omeprazole (PRILOSEC) 40 MG capsule TAKE 1 CAPSULE BY MOUTH EVERY DAY (Patient taking differently: Take 40 mg by mouth daily. ) 90 capsule 1  . ondansetron (ZOFRAN) 4 MG tablet Take 1 tablet (4 mg total) by mouth every 6 (six) hours as needed for nausea. 20 tablet 0  . pravastatin (PRAVACHOL) 40 MG tablet TAKE 1 TABLET BY MOUTH EVERY DAY 90 tablet 1  . senna (SENOKOT) 8.6 MG TABS tablet Take 2 tablets (17.2 mg total) by mouth at bedtime. 60 tablet 1  . SPIRIVA RESPIMAT 1.25 MCG/ACT AERS TAKE 2 PUFFS BY MOUTH EVERY DAY    . traZODone (DESYREL) 100 MG tablet TAKE 1 TABLET BY MOUTH EVERYDAY AT BEDTIME 90 tablet 1   No current facility-administered medications on file prior to  visit.    Review of Systems Constitutional: Negative for other unusual diaphoresis, sweats, appetite or weight changes HENT: Negative for other worsening hearing loss, ear pain, facial swelling, mouth sores or neck stiffness.   Eyes: Negative for other worsening pain, redness or other visual disturbance.  Respiratory: Negative for other stridor or swelling Cardiovascular: Negative for other palpitations or other chest pain  Gastrointestinal: Negative for worsening diarrhea or loose stools, blood in stool, distention or other pain Genitourinary: Negative for hematuria, flank pain or other change in urine volume.  Musculoskeletal: Negative for myalgias or other joint swelling.  Skin:  Negative for other color change, or other wound or worsening drainage.  Neurological: Negative for other syncope or numbness. Hematological: Negative for other adenopathy or swelling Psychiatric/Behavioral: Negative for hallucinations, other worsening agitation, SI, self-injury, or new decreased concentration All other system neg per pt    Objective:   Physical Exam BP 140/90   Pulse 61   Temp 98.5 F (36.9 C) (Oral)   Ht 5\' 4"  (1.626 m)   Wt 224 lb (101.6 kg)   SpO2 98%   BMI 38.45 kg/m  VS noted,  Constitutional: Pt is oriented to person, place, and time. Appears well-developed and well-nourished, in no significant distress and comfortable Head: Normocephalic and atraumatic  Eyes: Conjunctivae and EOM are normal. Pupils are equal, round, and reactive to light Right Ear: External ear normal without discharge Left Ear: External ear normal without discharge Nose: Nose without discharge or deformity Mouth/Throat: Oropharynx is without other ulcerations and moist  Neck: Normal range of motion. Neck supple. No JVD present. No tracheal deviation present or significant neck LA or mass Cardiovascular: Normal rate, regular rhythm, normal heart sounds and intact distal pulses.   Pulmonary/Chest: WOB normal and  breath sounds without rales or wheezing  Abdominal: Soft. Bowel sounds are normal. NT. No HSM  Musculoskeletal: Normal range of motion. Exhibits no edema Lymphadenopathy: Has no other cervical adenopathy.  Neurological: Pt is alert and oriented to person, place, and time. Pt has normal reflexes. No cranial nerve deficit. Motor grossly intact, Gait intact Skin: Skin is warm and dry. No rash noted or new ulcerations Psychiatric:  Has normal mood and affect. Behavior is normal without agitation No other exam findings  Lab Results  Component Value Date   WBC 5.8 02/06/2019   HGB 8.3 (L) 02/07/2019   HCT 27.1 (L) 02/07/2019   PLT 132 (L) 02/06/2019   GLUCOSE 117 (H) 02/04/2019   CHOL 121 07/08/2018   TRIG 86 07/08/2018   HDL 64 07/08/2018   LDLCALC 40 07/08/2018   ALT 11 07/08/2018   AST 10 07/08/2018   NA 138 02/04/2019   K 3.9 02/04/2019   CL 109 02/04/2019   CREATININE 0.61 02/04/2019   BUN 12 02/04/2019   CO2 26 02/04/2019   TSH 1.31 11/16/2017   INR 1.14 06/16/2011       Assessment & Plan:

## 2019-04-09 ENCOUNTER — Encounter: Payer: Self-pay | Admitting: Internal Medicine

## 2019-04-09 NOTE — Assessment & Plan Note (Signed)
Exam benign, declines MRI, for meclizine trial prn for probable BPV

## 2019-04-09 NOTE — Assessment & Plan Note (Signed)
For f/u lab, iron level

## 2019-04-09 NOTE — Assessment & Plan Note (Signed)

## 2019-04-12 ENCOUNTER — Telehealth: Payer: Self-pay

## 2019-04-12 NOTE — Telephone Encounter (Signed)
-----   Message from Biagio Borg, MD sent at 04/08/2019  9:22 PM EDT ----- Letter sent, cont same tx except  The test results show that your current treatment is OK, as the blood count is improved, but the Vitamin D levels is low.  Please start OTC Vit D 2000 units per day indefinitely to help overall bone health.    [name] to please inform pt, I will do referral/order/rx

## 2019-04-12 NOTE — Telephone Encounter (Signed)
Pt returned VM from [place]. [facility] phones down, instructed to send message. Please return call.

## 2019-04-12 NOTE — Telephone Encounter (Signed)
Called pt, LVM.   

## 2019-04-12 NOTE — Telephone Encounter (Signed)
Pt has been informed of results and expressed understanding.  °

## 2019-04-13 ENCOUNTER — Encounter: Payer: Self-pay | Admitting: Podiatry

## 2019-05-05 ENCOUNTER — Ambulatory Visit: Payer: BC Managed Care – PPO | Admitting: Internal Medicine

## 2019-05-16 ENCOUNTER — Ambulatory Visit: Payer: Medicare Other | Admitting: Podiatry

## 2019-05-16 ENCOUNTER — Other Ambulatory Visit: Payer: Self-pay

## 2019-05-16 ENCOUNTER — Encounter: Payer: Self-pay | Admitting: Podiatry

## 2019-05-16 VITALS — Temp 97.7°F

## 2019-05-16 DIAGNOSIS — M79672 Pain in left foot: Secondary | ICD-10-CM

## 2019-05-16 DIAGNOSIS — M722 Plantar fascial fibromatosis: Secondary | ICD-10-CM | POA: Diagnosis not present

## 2019-05-16 NOTE — Patient Instructions (Signed)

## 2019-05-26 NOTE — Progress Notes (Signed)
SUBJECTIVE: [name] presents to clinic on today for follow up plantar fasciitis of the left foot. Treatments on last visit include dexamethasone injection the left heel.  She states her pain was almost resolved until last Wednesday.  She states her pain scale today is a 7 out of 10 on scale of 1-10.  Last visit her pain scale was 9 out of 10.  OBJECTIVE: Vitals:   05/16/19 0849  Temp: 97.7 F (36.5 C)    Vascular: Dorsalis pedis and posterior tibial pulses palpable bilaterally. Capillary refill time immediate x 10 digits. No pedal edema b/l. No varicosities b/l. Skin temperature gradient WNL b/l.  Dermatological: Normal skin turgor, texture and tone b/l. No skin eruptions noted b/l. Nails 3 through 5 right, 1, 4, 5 left recently debrided.  Neurological: Epicritic sensation grossly intact b/l and symmetrically with 10 gram monofilament. Vibratory sensation intact b/l.   Musculoskeletal: Amputation left second digit Muscle strength 5/5 to all LE muscle groups b/l. Negative Tinel's sign b/l. Pain on palpation noted medial tubercle left foot.  ASSESSMENT: 1. Plantar fasciitis left foot 2. Pain in left foot  PLAN: 1. Patient examined today. 2. Modified shoes with felt arch support buildups to prevent eversion. 3. Patient's symptoms improved, both minimally. 4. Continue stretching exercises. 5. Follow-up 5 weeks.  If she does not have any significant improvement, we will give her another heel injection. 6. Patient to call should there be any question or concern in the interim.

## 2019-05-28 ENCOUNTER — Other Ambulatory Visit: Payer: Self-pay | Admitting: Internal Medicine

## 2019-05-30 ENCOUNTER — Other Ambulatory Visit (HOSPITAL_COMMUNITY): Payer: Self-pay | Admitting: Orthopedic Surgery

## 2019-05-30 DIAGNOSIS — T84093D Other mechanical complication of internal left knee prosthesis, subsequent encounter: Secondary | ICD-10-CM

## 2019-05-30 DIAGNOSIS — T84013A Broken internal left knee prosthesis, initial encounter: Secondary | ICD-10-CM

## 2019-06-06 ENCOUNTER — Encounter (HOSPITAL_COMMUNITY)
Admission: RE | Admit: 2019-06-06 | Discharge: 2019-06-06 | Disposition: A | Discharge status: Home / Self Care | Payer: BC Managed Care – PPO | Source: Ambulatory Visit | Attending: Orthopedic Surgery | Admitting: Orthopedic Surgery

## 2019-06-06 ENCOUNTER — Other Ambulatory Visit: Payer: Self-pay

## 2019-06-06 DIAGNOSIS — T84013A Broken internal left knee prosthesis, initial encounter: Secondary | ICD-10-CM | POA: Insufficient documentation

## 2019-06-06 DIAGNOSIS — T84093D Other mechanical complication of internal left knee prosthesis, subsequent encounter: Secondary | ICD-10-CM | POA: Insufficient documentation

## 2019-06-06 MED ORDER — TECHNETIUM TC 99M MEDRONATE IV KIT
21.7000 | PACK | Freq: Once | INTRAVENOUS | Status: AC | PRN
  Administered 2019-06-06: 21.7 via INTRAVENOUS

## 2019-06-20 ENCOUNTER — Ambulatory Visit: Payer: Medicare Other | Admitting: Podiatry

## 2019-06-20 ENCOUNTER — Encounter: Payer: Self-pay | Admitting: Podiatry

## 2019-06-20 ENCOUNTER — Other Ambulatory Visit: Payer: Self-pay

## 2019-06-20 DIAGNOSIS — M79672 Pain in left foot: Secondary | ICD-10-CM | POA: Diagnosis not present

## 2019-06-20 DIAGNOSIS — M722 Plantar fascial fibromatosis: Secondary | ICD-10-CM | POA: Diagnosis not present

## 2019-06-20 DIAGNOSIS — M79675 Pain in left toe(s): Secondary | ICD-10-CM | POA: Diagnosis not present

## 2019-06-20 DIAGNOSIS — M79674 Pain in right toe(s): Secondary | ICD-10-CM | POA: Diagnosis not present

## 2019-06-20 DIAGNOSIS — B351 Tinea unguium: Secondary | ICD-10-CM | POA: Diagnosis not present

## 2019-06-20 NOTE — Patient Instructions (Addendum)
Plantar Fasciitis (Heel Spur Syndrome) with Rehab The plantar fascia is a fibrous, ligament-like, soft-tissue structure that spans the bottom of the foot. Plantar fasciitis is a condition that causes pain in the foot due to inflammation of the tissue. SYMPTOMS   Pain and tenderness on the underneath side of the foot.  Pain that worsens with standing or walking. CAUSES  Plantar fasciitis is caused by irritation and injury to the plantar fascia on the underneath side of the foot. Common mechanisms of injury include:  Direct trauma to bottom of the foot.  Damage to a small nerve that runs under the foot where the main fascia attaches to the heel bone.  Stress placed on the plantar fascia due to bone spurs. RISK INCREASES WITH:   Activities that place stress on the plantar fascia (running, jumping, pivoting, or cutting).  Poor strength and flexibility.  Improperly fitted shoes.  Tight calf muscles.  Flat feet.  Failure to warm-up properly before activity.  Obesity. PREVENTION  Warm up and stretch properly before activity.  Allow for adequate recovery between workouts.  Maintain physical fitness:  Strength, flexibility, and endurance.  Cardiovascular fitness.  Maintain a health body weight.  Avoid stress on the plantar fascia.  Wear properly fitted shoes, including arch supports for individuals who have flat feet.  PROGNOSIS  If treated properly, then the symptoms of plantar fasciitis usually resolve without surgery. However, occasionally surgery is necessary.  RELATED COMPLICATIONS   Recurrent symptoms that may result in a chronic condition.  Problems of the lower back that are caused by compensating for the injury, such as limping.  Pain or weakness of the foot during push-off following surgery.  Chronic inflammation, scarring, and partial or complete fascia tear, occurring more often from repeated injections.  TREATMENT  Treatment initially involves the  use of ice and medication to help reduce pain and inflammation. The use of strengthening and stretching exercises may help reduce pain with activity, especially stretches of the Achilles tendon. These exercises may be performed at home or with a therapist. Your caregiver may recommend that you use heel cups of arch supports to help reduce stress on the plantar fascia. Occasionally, corticosteroid injections are given to reduce inflammation. If symptoms persist for greater than 6 months despite non-surgical (conservative), then surgery may be recommended.   MEDICATION   If pain medication is necessary, then nonsteroidal anti-inflammatory medications, such as aspirin and ibuprofen, or other minor pain relievers, such as acetaminophen, are often recommended.  Do not take pain medication within 7 days before surgery.  Prescription pain relievers may be given if deemed necessary by your caregiver. Use only as directed and only as much as you need.  Corticosteroid injections may be given by your caregiver. These injections should be reserved for the most serious cases, because they may only be given a certain number of times.  HEAT AND COLD  Cold treatment (icing) relieves pain and reduces inflammation. Cold treatment should be applied for 10 to 15 minutes every 2 to 3 hours for inflammation and pain and immediately after any activity that aggravates your symptoms. Use ice packs or massage the area with a piece of ice (ice massage).  Heat treatment may be used prior to performing the stretching and strengthening activities prescribed by your caregiver, physical therapist, or athletic trainer. Use a heat pack or soak the injury in warm water.  SEEK IMMEDIATE MEDICAL CARE IF:  Treatment seems to offer no benefit, or the condition worsens.  Any medications   produce adverse side effects.  EXERCISES- RANGE OF MOTION (ROM) AND STRETCHING EXERCISES - Plantar Fasciitis (Heel Spur Syndrome) These exercises  may help you when beginning to rehabilitate your injury. Your symptoms may resolve with or without further involvement from your physician, physical therapist or athletic trainer. While completing these exercises, remember:   Restoring tissue flexibility helps normal motion to return to the joints. This allows healthier, less painful movement and activity.  An effective stretch should be held for at least 30 seconds.  A stretch should never be painful. You should only feel a gentle lengthening or release in the stretched tissue.  RANGE OF MOTION - Toe Extension, Flexion  Sit with your right / left leg crossed over your opposite knee.  Grasp your toes and gently pull them back toward the top of your foot. You should feel a stretch on the bottom of your toes and/or foot.  Hold this stretch for 10 seconds.  Now, gently pull your toes toward the bottom of your foot. You should feel a stretch on the top of your toes and or foot.  Hold this stretch for 10 seconds. Repeat  times. Complete this stretch 3 times per day.   RANGE OF MOTION - Ankle Dorsiflexion, Active Assisted  Remove shoes and sit on a chair that is preferably not on a carpeted surface.  Place right / left foot under knee. Extend your opposite leg for support.  Keeping your heel down, slide your right / left foot back toward the chair until you feel a stretch at your ankle or calf. If you do not feel a stretch, slide your bottom forward to the edge of the chair, while still keeping your heel down.  Hold this stretch for 10 seconds. Repeat 3 times. Complete this stretch 2 times per day.   STRETCH  Gastroc, Standing  Place hands on wall.  Extend right / left leg, keeping the front knee somewhat bent.  Slightly point your toes inward on your back foot.  Keeping your right / left heel on the floor and your knee straight, shift your weight toward the wall, not allowing your back to arch.  You should feel a gentle stretch  in the right / left calf. Hold this position for 10 seconds. Repeat 3 times. Complete this stretch 2 times per day.  STRETCH  Soleus, Standing  Place hands on wall.  Extend right / left leg, keeping the other knee somewhat bent.  Slightly point your toes inward on your back foot.  Keep your right / left heel on the floor, bend your back knee, and slightly shift your weight over the back leg so that you feel a gentle stretch deep in your back calf.  Hold this position for 10 seconds. Repeat 3 times. Complete this stretch 2 times per day.  STRETCH  Gastrocsoleus, Standing  Note: This exercise can place a lot of stress on your foot and ankle. Please complete this exercise only if specifically instructed by your caregiver.   Place the ball of your right / left foot on a step, keeping your other foot firmly on the same step.  Hold on to the wall or a rail for balance.  Slowly lift your other foot, allowing your body weight to press your heel down over the edge of the step.  You should feel a stretch in your right / left calf.  Hold this position for 10 seconds.  Repeat this exercise with a slight bend in your right /   left knee. Repeat 3 times. Complete this stretch 2 times per day.   STRENGTHENING EXERCISES - Plantar Fasciitis (Heel Spur Syndrome)  These exercises may help you when beginning to rehabilitate your injury. They may resolve your symptoms with or without further involvement from your physician, physical therapist or athletic trainer. While completing these exercises, remember:   Muscles can gain both the endurance and the strength needed for everyday activities through controlled exercises.  Complete these exercises as instructed by your physician, physical therapist or athletic trainer. Progress the resistance and repetitions only as guided.  STRENGTH - Towel Curls  Sit in a chair positioned on a non-carpeted surface.  Place your foot on a towel, keeping your heel  on the floor.  Pull the towel toward your heel by only curling your toes. Keep your heel on the floor. Repeat 3 times. Complete this exercise 2 times per day.  STRENGTH - Ankle Inversion  Secure one end of a rubber exercise band/tubing to a fixed object (table, pole). Loop the other end around your foot just before your toes.  Place your fists between your knees. This will focus your strengthening at your ankle.  Slowly, pull your big toe up and in, making sure the band/tubing is positioned to resist the entire motion.  Hold this position for 10 seconds.  Have your muscles resist the band/tubing as it slowly pulls your foot back to the starting position. Repeat 3 times. Complete this exercises 2 times per day.  Document Released: 11/17/2005 Document Revised: 02/09/2012 Document Reviewed: 03/01/2009 [employer] Patient Information 2014 [employer]  Onychomycosis/Fungal Toenails  WHAT IS IT? An infection that lies within the keratin of your nail plate that is caused by a fungus.  WHY ME? Fungal infections affect all ages, sexes, races, and creeds.  There may be many factors that predispose you to a fungal infection such as age, coexisting medical conditions such as diabetes, or an autoimmune disease; stress, medications, fatigue, genetics, etc.  Bottom line: fungus thrives in a warm, moist environment and your shoes offer such a location.  IS IT CONTAGIOUS? Theoretically, yes.  You do not want to share shoes, nail clippers or files with someone who has fungal toenails.  Walking around barefoot in the same room or sleeping in the same bed is unlikely to transfer the organism.  It is important to realize, however, that fungus can spread easily from one nail to the next on the same foot.  HOW DO WE TREAT THIS?  There are several ways to treat this condition.  Treatment may depend on many factors such as age, medications, pregnancy, liver and kidney conditions, etc.  It is best to ask your  doctor which options are available to you.  1. No treatment.   Unlike many other medical concerns, you can live with this condition.  However for many people this can be a painful condition and may lead to ingrown toenails or a bacterial infection.  It is recommended that you keep the nails cut short to help reduce the amount of fungal nail. 2. Topical treatment.  These range from herbal remedies to prescription strength nail lacquers.  About 40-50% effective, topicals require twice daily application for approximately 9 to 12 months or until an entirely new nail has grown out.  The most effective topicals are medical grade medications available through physicians offices. 3. Oral antifungal medications.  With an 80-90% cure rate, the most common oral medication requires 3 to 4 months of therapy and stays   in your system for a year as the new nail grows out.  Oral antifungal medications do require blood work to make sure it is a safe drug for you.  A liver function panel will be performed prior to starting the medication and after the first month of treatment.  It is important to have the blood work performed to avoid any harmful side effects.  In general, this medication safe but blood work is required. 4. Laser Therapy.  This treatment is performed by applying a specialized laser to the affected nail plate.  This therapy is noninvasive, fast, and non-painful.  It is not covered by insurance and is therefore, out of pocket.  The results have been very good with a 80-95% cure rate.  [facility] is the only practice in the area to offer this therapy. 5. Permanent Nail Avulsion.  Removing the entire nail so that a new nail will not grow back. 

## 2019-06-26 NOTE — Progress Notes (Signed)
SUBJECTIVE: [name] presents to clinic on today for painful, elongated, mycotic toenails b/l and  follow up plantar fasciitis of the left heel.  Treatments on last visit include new shoe gear, adding arch buildup in sneakers, and stretching exercises. She had an injection consisting of 1cc each Lidocaine plain, marcaine plain and 4 mg dexamethasone phosphate on Apr 08, 2019.  Today, pain level is  on scale of 8/10.   OBJECTIVE: Vascular: Capillary refill time immediate x 10 digits.  Dorsalis pedis and posterior tibial pulses palpable bilaterally.  No pedal edema b/l.  No varicosities b/l.  Skin temperature gradient WNL b/l.  Dermatological: Normal skin turgor, texture and tone b/l.  No skin eruptions noted b/l.  Toenails 3-5 right, 1, 4, 5 left foot are painful, elongated, discolored, dystrophic with subungual debris. Pain with dorsal palpation of nailplates. No erythema, no edema, no drainage noted.  Anonychia right great toe, b/l 2nd toes, left 3rd toe with evidence of permanent total nail avulsion. Nailbeds completely epithelialized and intact.  Neurological: Epicritic sensation grossly intact b/l and symmetrically with 10 gram monofilament.  Vibratory sensation intact b/l.  Musculoskeletal: Muscle strength 5/5 to all LE muscle groups b/l.  Negative Tinel's sign b/l.  Pain on palpation noted medial tubercle left heel.  ASSESSMENT: 1. Plantar fasciitis left foot  2.  Pain in foot left foot 3.  Painful due to onychomycosis  PLAN: 1. Patient examined today. 2. Toenails 3-5 right, 1, 4, 5 left foot debrided in length and girth without iatrogenic laceration. 3. Patient's symptoms not improving. 4. Discussed etiology, pathology, conservative vs. surgical therapies. At this time a plantar fascial injection was recommended.  The patient agreed and a sterile skin prep was applied.  An injection consisting of 1 cc marcaine plain, 1 cc lidocaine plain, 0.5 cc dexamethasone  phosphate and 1/2 cc Kenalog 10  was infiltrated at the point of maximal tenderness on the right heel.  The patient tolerated this well and was given instructions for aftercare.  5. Continue plantar fascial strap daily,  Icing, and stretching exercises. 6. Follow up 9 weeks.

## 2019-06-27 ENCOUNTER — Encounter: Payer: Self-pay | Admitting: Internal Medicine

## 2019-07-23 ENCOUNTER — Other Ambulatory Visit: Payer: Self-pay | Admitting: Internal Medicine

## 2019-07-24 ENCOUNTER — Other Ambulatory Visit: Payer: Self-pay | Admitting: Internal Medicine

## 2019-08-07 ENCOUNTER — Other Ambulatory Visit: Payer: Self-pay | Admitting: Internal Medicine

## 2019-08-15 ENCOUNTER — Ambulatory Visit: Payer: BC Managed Care – PPO | Admitting: Cardiology

## 2019-08-15 ENCOUNTER — Ambulatory Visit: Payer: Self-pay | Admitting: *Deleted

## 2019-08-15 NOTE — Telephone Encounter (Signed)
Noted  

## 2019-08-15 NOTE — Telephone Encounter (Signed)
Patient is calling to report she has started having tingling in both her hands- comes and goes- started last Thursday.Patient reports sometimes her hands feel cold- L hand is worse than R. Patient has no other symptoms reported. Patient is working and wants to make appointment after her work hours. Appointment made with instruction- if she has any other symptoms- she is to be seen at [facility]- she agrees and understands.  Reason for Disposition . [1] Numbness or tingling on both sides of body AND [2] is a new symptom present > 24 hours  Answer Assessment - Initial Assessment Questions 1. SYMPTOM: "What is the main symptom you are concerned about?" (e.g., weakness, numbness)     Tingling in hands 2. ONSET: "When did this start?" (minutes, hours, days; while sleeping)     Off/on- worse since Thursday 3. LAST NORMAL: "When was the last time you were normal (no symptoms)?"     Intermittent numbness- increased last week 4. PATTERN "Does this come and go, or has it been constant since it started?"  "Is it present now?"     Comes and goes, not present now 5. CARDIAC SYMPTOMS: "Have you had any of the following symptoms: chest pain, difficulty breathing, palpitations?"     no 6. NEUROLOGIC SYMPTOMS: "Have you had any of the following symptoms: headache, dizziness, vision loss, double vision, changes in speech, unsteady on your feet?"     no 7. OTHER SYMPTOMS: "Do you have any other symptoms?"     no 8. PREGNANCY: "Is there any chance you are pregnant?" "When was your last menstrual period?"     n/a  Protocols used: NEUROLOGIC DEFICIT-A-AH

## 2019-08-19 ENCOUNTER — Encounter: Payer: Self-pay | Admitting: Internal Medicine

## 2019-08-19 ENCOUNTER — Other Ambulatory Visit: Payer: Self-pay

## 2019-08-19 ENCOUNTER — Ambulatory Visit (INDEPENDENT_AMBULATORY_CARE_PROVIDER_SITE_OTHER): Payer: BC Managed Care – PPO | Admitting: Internal Medicine

## 2019-08-19 DIAGNOSIS — I10 Essential (primary) hypertension: Secondary | ICD-10-CM | POA: Diagnosis not present

## 2019-08-19 DIAGNOSIS — G5603 Carpal tunnel syndrome, bilateral upper limbs: Secondary | ICD-10-CM | POA: Insufficient documentation

## 2019-08-19 DIAGNOSIS — M542 Cervicalgia: Secondary | ICD-10-CM | POA: Insufficient documentation

## 2019-08-19 MED ORDER — PREDNISONE 10 MG PO TABS: ORAL_TABLET | ORAL | 0 refills | Status: DC

## 2019-08-19 MED ORDER — TRAMADOL HCL 50 MG PO TABS: 50.0000 mg | ORAL_TABLET | Freq: Four times a day (QID) | ORAL | 0 refills | Status: DC | PRN

## 2019-08-19 MED ORDER — TIZANIDINE HCL 2 MG PO TABS: 2.0000 mg | ORAL_TABLET | Freq: Four times a day (QID) | ORAL | 1 refills | Status: DC | PRN

## 2019-08-19 NOTE — Patient Instructions (Signed)
Please take all new medication as prescribed - the tramadol for pain, prednisone for inflammation, and tizanidine for muscle relaxer as needed  Please continue all other medications as before, and refills have been done if requested.  Please have the pharmacy call with any other refills you may need.  Please continue your efforts at being more active, low cholesterol diet, and weight control.  Please keep your appointments with your specialists as you may have planned
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
## 2019-08-20 ENCOUNTER — Encounter: Payer: Self-pay | Admitting: Internal Medicine

## 2019-08-20 NOTE — Assessment & Plan Note (Signed)
stable overall by history and exam, recent data reviewed with pt, and pt to continue medical treatment as before,  to f/u any worsening symptoms or concerns  

## 2019-08-20 NOTE — Assessment & Plan Note (Signed)
C/w msk strain, for muscle relaxer prn,  to f/u any worsening symptoms or concerns

## 2019-08-20 NOTE — Assessment & Plan Note (Signed)
Has been left sided in past then improved, now worsening left moderate > right mild, for tramadol prn, predpac asd, pt declines NCS/emg or hand surgury referral at this time

## 2019-08-30 ENCOUNTER — Other Ambulatory Visit: Payer: Self-pay

## 2019-08-30 ENCOUNTER — Telehealth (HOSPITAL_COMMUNITY): Payer: Self-pay

## 2019-08-30 DIAGNOSIS — I872 Venous insufficiency (chronic) (peripheral): Secondary | ICD-10-CM

## 2019-08-30 NOTE — Telephone Encounter (Signed)
Voicemail was left for patient including appointment time and information and instructions regarding COVID-19 safety procedures including mask usage, rescheduling if sympotmatic, and limited visitors in testing area.  

## 2019-08-31 ENCOUNTER — Other Ambulatory Visit: Payer: Self-pay

## 2019-08-31 ENCOUNTER — Ambulatory Visit (HOSPITAL_COMMUNITY)
Admission: RE | Admit: 2019-08-31 | Discharge: 2019-08-31 | Disposition: A | Discharge status: Home / Self Care | Payer: BC Managed Care – PPO | Source: Ambulatory Visit | Attending: Family | Admitting: Family

## 2019-08-31 ENCOUNTER — Ambulatory Visit (INDEPENDENT_AMBULATORY_CARE_PROVIDER_SITE_OTHER): Payer: BC Managed Care – PPO | Admitting: Physician Assistant

## 2019-08-31 VITALS — BP 132/64 | HR 68 | Temp 97.9°F | Resp 14 | Ht 66.0 in | Wt 224.1 lb

## 2019-08-31 DIAGNOSIS — I739 Peripheral vascular disease, unspecified: Secondary | ICD-10-CM | POA: Insufficient documentation

## 2019-08-31 DIAGNOSIS — J449 Chronic obstructive pulmonary disease, unspecified: Secondary | ICD-10-CM

## 2019-08-31 DIAGNOSIS — L97521 Non-pressure chronic ulcer of other part of left foot limited to breakdown of skin: Secondary | ICD-10-CM | POA: Diagnosis not present

## 2019-08-31 DIAGNOSIS — I5032 Chronic diastolic (congestive) heart failure: Secondary | ICD-10-CM | POA: Insufficient documentation

## 2019-08-31 DIAGNOSIS — I872 Venous insufficiency (chronic) (peripheral): Secondary | ICD-10-CM | POA: Diagnosis not present

## 2019-08-31 DIAGNOSIS — R609 Edema, unspecified: Secondary | ICD-10-CM | POA: Diagnosis not present

## 2019-08-31 NOTE — Progress Notes (Signed)
Requested by:  Biagio Borg, MD [place],  [place] [id number]  Reason for consultation: edema BLE, varicose veins LLE    History of Present Illness   [name] is a 78 y.o. (1941/01/27) female who presents with chief complaint: BLE edema and LLE varicose veins.  Patient was referred to our office for evaluation by her orthopedic surgeon Dr. Lyla Glassing.  She has had a total knee replacement on her right side as well as her left and most recently a revision of her left knee replacement in March of this year.  She has noticed edema especially after this most recent surgery and bilateral lower extremities left greater than right.  She wears compression occasionally on left lower extremity to help with the edema however admittedly has not worn compression regularly.  She also has some itching and pain with varicosities especially large area on anterior shin.  She denies any venous ulcerations.  She does have some pigmentation changes bilateral shins.  She denies any history of DVT.  She takes a daily aspirin.  She denies any history of claudication.  Despite record of left greater saphenous vein ablation in 2013 in patient's EMR patient denies any history of vein ablation and has no recollection of this.  Past Medical History:  Diagnosis Date  . Allergic rhinitis   . Arnold-Chiari malformation ([place])   . Asthma   . CAD (coronary artery disease)   . COPD (chronic obstructive pulmonary disease) ([place])   . Coughing    coughing since last 01-22-2019 , started on cefdinir bid x10 days, has 3 pills left today , reports she feels mcuh better , still coughing copiuus amonts of thick white sputum , denies fever nor chills, nor body aches .   Marland Kitchen DJD (degenerative joint disease)   . GERD (gastroesophageal reflux disease)   . History of absence seizures    last confirmed seizure age 78    . Hx of colonic polyps   . Hypercholesteremia   . Hypertension   . Obesity   . Positive PPD   .  Reflex sympathetic dystrophy   . Varicose veins   . Venous insufficiency     Past Surgical History:  Procedure Laterality Date  . ABDOMINAL HYSTERECTOMY    . cspine surgery  1993   for arnold-chiari malformation  . JOINT REPLACEMENT  06/05/11   Left total knee replacement  . right knee arthroscopy  12/2007   Dr. Tonita Cong  . right total knee replacement  05/2008   Dr. Tonita Cong  . TOTAL KNEE REVISION Left 02/03/2019   Procedure: LEFT TOTAL KNEE REVISION;  Surgeon: Rod Can, MD;  Location: [facility];  Service: Orthopedics;  Laterality: Left;    Social History   Socioeconomic History  . Marital status: Widowed    Spouse name: Not on file  . Number of children: 2  . Years of education: 47  . Highest education level: Not on file  Occupational History  . Occupation: retired  Scientific laboratory technician  . Financial resource strain: Not on file  . Food insecurity    Worry: Not on file    Inability: Not on file  . Transportation needs    Medical: Not on file    Non-medical: Not on file  Tobacco Use  . Smoking status: Former Smoker    Packs/day: 0.30    Years: 12.00    Pack years: 3.60    Types: Cigarettes    Quit date:  01/02/2012    Years since quitting: 7.6  . Smokeless tobacco: Never Used  . Tobacco comment: 1 pack per week  Substance and Sexual Activity  . Alcohol use: No  . Drug use: No  . Sexual activity: Not on file  Lifestyle  . Physical activity    Days per week: Not on file    Minutes per session: Not on file  . Stress: Not on file  Relationships  . Social Herbalist on phone: Not on file    Gets together: Not on file    Attends religious service: Not on file    Active member of club or organization: Not on file    Attends meetings of clubs or organizations: Not on file    Relationship status: Not on file  . Intimate partner violence    Fear of current or ex partner: Not on file    Emotionally abused: Not on file    Physically abused: Not on file    Forced sexual  activity: Not on file  Other Topics Concern  . Not on file  Social History Narrative   Fun/Hobby: Playing cards and mingling.   Denies abuse and feels safe at home.     Family History  Problem Relation Age of Onset  . Heart disease Mother   . Stroke Mother   . Stroke Father   . Clotting disorder Father   . Colon cancer Neg Hx   . Stomach cancer Neg Hx     Current Outpatient Medications  Medication Sig Dispense Refill  . albuterol (PROVENTIL HFA;VENTOLIN HFA) 108 (90 BASE) MCG/ACT inhaler Inhale 2 puffs into the lungs every 4 (four) hours as needed. For wheeze or shortness of breath (Patient taking differently: Inhale 2 puffs into the lungs every 4 (four) hours as needed for wheezing or shortness of breath. ) 1 Inhaler 6  . apixaban (ELIQUIS) 2.5 MG TABS tablet Take 1 tablet (2.5 mg total) by mouth every 12 (twelve) hours. 60 tablet 0  . aspirin 81 MG tablet Take 81 mg by mouth daily.      Marland Kitchen azelastine (ASTELIN) 0.1 % nasal spray Place 1 spray into both nostrils daily as needed for rhinitis or allergies.     Marland Kitchen docusate sodium (COLACE) 100 MG capsule Take 1 capsule (100 mg total) by mouth 2 (two) times daily. 60 capsule 1  . EPINEPHrine 0.3 mg/0.3 mL IJ SOAJ injection epinephrine 0.3 mg/0.3 mL injection, auto-injector    . ferrous sulfate 325 (65 FE) MG EC tablet Take 325 mg by mouth daily.    . fluticasone (FLONASE) 50 MCG/ACT nasal spray Place 1 spray into both nostrils 2 (two) times daily as needed for allergies or rhinitis.    . furosemide (LASIX) 40 MG tablet TAKE 2 TABLETS BY MOUTH DAILY. ANNUAL APPT DUE IN DEC MUST SEE PROVIDER FOR FUTURE REFILLS 180 tablet 0  . KLOR-CON M20 20 MEQ tablet TAKE 1 TABLET BY MOUTH EVERY DAY 90 tablet 1  . meclizine (ANTIVERT) 12.5 MG tablet Take 1 tablet (12.5 mg total) by mouth 3 (three) times daily as needed for dizziness. 40 tablet 2  . meloxicam (MOBIC) 15 MG tablet TAKE 1 TABLET BY MOUTH EVERY DAY AS NEEDED FOR PAIN 30 tablet 5  . Menthol (ICY  HOT BACK EXTRA STRENGTH) 5 % PTCH Apply 1 patch topically daily as needed (pain).    . montelukast (SINGULAIR) 10 MG tablet Take 10 mg by mouth every evening.    Marland Kitchen  mupirocin ointment (BACTROBAN) 2 % USE IN BOTH NOSTRILS TWICE A DAY FOR 5 DAYS    . omeprazole (PRILOSEC) 40 MG capsule TAKE 1 CAPSULE BY MOUTH EVERY DAY (Patient taking differently: Take 40 mg by mouth daily. ) 90 capsule 1  . ondansetron (ZOFRAN) 4 MG tablet Take 1 tablet (4 mg total) by mouth every 6 (six) hours as needed for nausea. 20 tablet 0  . pravastatin (PRAVACHOL) 40 MG tablet TAKE 1 TABLET BY MOUTH EVERY DAY 90 tablet 1  . senna (SENOKOT) 8.6 MG TABS tablet Take 2 tablets (17.2 mg total) by mouth at bedtime. 60 tablet 1  . SPIRIVA RESPIMAT 1.25 MCG/ACT AERS TAKE 2 PUFFS BY MOUTH EVERY DAY    . tiZANidine (ZANAFLEX) 2 MG tablet Take 1 tablet (2 mg total) by mouth every 6 (six) hours as needed for muscle spasms. 40 tablet 1  . traMADol (ULTRAM) 50 MG tablet Take 1 tablet (50 mg total) by mouth every 6 (six) hours as needed. 30 tablet 0  . traZODone (DESYREL) 100 MG tablet TAKE 1 TABLET BY MOUTH EVERYDAY AT BEDTIME 90 tablet 1  . budesonide-formoterol (SYMBICORT) 160-4.5 MCG/ACT inhaler Inhale 2 puffs into the lungs 2 (two) times daily. 1 Inhaler 0   No current facility-administered medications for this visit.     No Known Allergies  REVIEW OF SYSTEMS (negative unless checked):   Cardiac:  []  Chest pain or chest pressure? []  Shortness of breath upon activity? []  Shortness of breath when lying flat? []  Irregular heart rhythm?  Vascular:  []  Pain in calf, thigh, or hip brought on by walking? []  Pain in feet at night that wakes you up from your sleep? []  Blood clot in your veins? [x]  Leg swelling?  Pulmonary:  []  Oxygen at home? []  Productive cough? []  Wheezing?  Neurologic:  []  Sudden weakness in arms or legs? []  Sudden numbness in arms or legs? []  Sudden onset of difficult speaking or slurred speech? []   Temporary loss of vision in one eye? []  Problems with dizziness?  Gastrointestinal:  []  Blood in stool? []  Vomited blood?  Genitourinary:  []  Burning when urinating? []  Blood in urine?  Psychiatric:  []  Major depression  Hematologic:  []  Bleeding problems? []  Problems with blood clotting?  Dermatologic:  []  Rashes or ulcers?  Constitutional:  []  Fever or chills?  Ear/Nose/Throat:  []  Change in hearing? []  Nose bleeds? []  Sore throat?  Musculoskeletal:  []  Back pain? [x]  Joint pain? []  Muscle pain?   Physical Examination     Vitals:   08/31/19 1456  BP: 132/64  Pulse: 68  Resp: 14  Temp: 97.9 F (36.6 C)  TempSrc: Temporal  SpO2: 96%  Weight: 224 lb 1.6 oz (101.7 kg)  Height: 5\' 6"  (1.676 m)   Body mass index is 36.17 kg/m.  General Alert, O x 3, WD, NAD  Head [place]/AT,    Neck Supple, mid-line trachea,    Pulmonary Sym exp, good B air movt, CTA B  Cardiac RRR, Nl S1, S2  Vascular Vessel Right Left  Radial Palpable Palpable  Popliteal Not palpable Not palpable  PT Not palpable Not palpable  DP palpable ATA palpable ATA    Gastro- intestinal soft, non-distended, non-tender to palpation,   Musculo- skeletal M/S 5/5 throughout  , Extremities without ischemic changes  , Pitting edema present: to the level of the knee LLE, Varicosities present: L medial distal thigh and shin, No Lipodermatosclerosis present; stasis pigmentation changes L>R LE  Neurologic Cranial  nerves 2-12 intact , Pain and light touch intact in extremities , Motor exam as listed above  Psychiatric Judgement intact, Mood & affect appropriate for pt's clinical situation  Dermatologic See M/S exam for extremity exam, No rashes otherwise noted  Lymphatic  Palpable lymph nodes: None    Non-invasive Vascular Imaging   BLE Venous Insufficiency Duplex (08/31/19):   RLE:   Negative for DVT and SVT,   Negative for GSV reflux,   Negative for SSV reflux,  Negative for deep venous  reflux  LLE:  Negative for DVT and SVT,    GSV reflux at junction and at level of the knee,   Negative for SSV reflux,  Negative for deep venous reflux  Evidence of prior left greater saphenous vein ablation   Medical Decision Making   TEEYA WYGLE is a 78 y.o. female who presents with: BLE chronic venous insufficiency (C4), asymptomatic varicose veins   Based on venous reflux study of bilateral lower extremities patient does not have any notable insufficiency in superficial system  There is evidence on imaging of prior left greater saphenous vein ablation however the patient does not have any recollection of this procedure; greater saphenous vein remains closed  I have recommended and measured the patient for thigh-high compression to be worn daily  She will also need to elevate her legs during the day as much as possible  I encouraged her to remain active and to use NSAIDs for discomfort related to edema of bilateral lower extremities  If in the future she notices more tissue changes or her varicose veins become symptomatic she will return to office; as for now she will follow-up on a as needed basis   Dagoberto Ligas PA-C [facility] Office: [phone]  08/31/2019, 3:13 PM  Clinic MD: Dr. Oneida Alar and Dr. Forde Dandy

## 2019-09-02 ENCOUNTER — Ambulatory Visit: Payer: Medicare Other | Admitting: Podiatry

## 2019-09-09 ENCOUNTER — Other Ambulatory Visit: Payer: Self-pay | Admitting: Internal Medicine

## 2019-10-11 ENCOUNTER — Ambulatory Visit: Payer: BC Managed Care – PPO | Admitting: Internal Medicine

## 2019-10-11 ENCOUNTER — Other Ambulatory Visit: Payer: Self-pay

## 2019-10-11 DIAGNOSIS — Z20822 Contact with and (suspected) exposure to covid-19: Secondary | ICD-10-CM

## 2019-10-12 LAB — NOVEL CORONAVIRUS, NAA: SARS-CoV-2, NAA: NOT DETECTED

## 2019-10-14 ENCOUNTER — Telehealth: Payer: Self-pay | Admitting: General Practice

## 2019-10-14 NOTE — Telephone Encounter (Signed)
Negative COVID results given. Patient results "NOT Detected." Caller expressed understanding. ° °

## 2019-10-31 NOTE — Progress Notes (Signed)
HPI: FU coronary disease. Cardiac catheterization 2/05 showed mild irregularities in the left main. There was a 40% stenosis in the proximal LAD and a distal 30% lesion. The first diagonal had a 30-40% proximal lesion. The ostium of the LAD by intravascular ultrasound had a luminal area of 3.9 mm squared and percent stenosis 60%. She had nonobstructive disease in the left circumflex and right coronary. Her LV function was normal. Nuclear study 1/16 showed EF 58 and normal perfusion. Echocardiogram April 2019 showed normal LV function, mild left ventricular hypertrophy, grade 1 diastolic dysfunction, mild right atrial and right ventricular enlargement and mild tricuspid regurgitation.  Patient seen May 2019 with lower extremity edema.  Lasix had recently been increased.  ABIs May 2019 normal.  Since I last saw her, the patient has dyspnea with more extreme activities but not with routine activities. It is relieved with rest. It is not associated with chest pain. There is no orthopnea, PND; pedal edema controlled with Lasix. There is no syncope or palpitations. There is no exertional chest pain.   Current Outpatient Medications  Medication Sig Dispense Refill  . albuterol (PROVENTIL HFA;VENTOLIN HFA) 108 (90 BASE) MCG/ACT inhaler Inhale 2 puffs into the lungs every 4 (four) hours as needed. For wheeze or shortness of breath (Patient taking differently: Inhale 2 puffs into the lungs every 4 (four) hours as needed for wheezing or shortness of breath. ) 1 Inhaler 6  . aspirin 81 MG tablet Take 81 mg by mouth daily.      Marland Kitchen azelastine (ASTELIN) 0.1 % nasal spray Place 1 spray into both nostrils daily as needed for rhinitis or allergies.     . budesonide-formoterol (SYMBICORT) 160-4.5 MCG/ACT inhaler Inhale 2 puffs into the lungs 2 (two) times daily. 1 Inhaler 0  . EPINEPHrine 0.3 mg/0.3 mL IJ SOAJ injection epinephrine 0.3 mg/0.3 mL injection, auto-injector    . ferrous sulfate 325 (65 FE) MG EC tablet  Take 325 mg by mouth daily.    . fluticasone (FLONASE) 50 MCG/ACT nasal spray Place 1 spray into both nostrils 2 (two) times daily as needed for allergies or rhinitis.    . furosemide (LASIX) 40 MG tablet TAKE 2 TABLETS BY MOUTH DAILY. ANNUAL APPT DUE IN DEC MUST SEE PROVIDER FOR FUTURE REFILLS 180 tablet 0  . KLOR-CON M20 20 MEQ tablet TAKE 1 TABLET BY MOUTH EVERY DAY 90 tablet 1  . meclizine (ANTIVERT) 12.5 MG tablet Take 1 tablet (12.5 mg total) by mouth 3 (three) times daily as needed for dizziness. 40 tablet 2  . meloxicam (MOBIC) 15 MG tablet TAKE 1 TABLET BY MOUTH EVERY DAY AS NEEDED FOR PAIN 30 tablet 5  . montelukast (SINGULAIR) 10 MG tablet Take 10 mg by mouth every evening.    . mupirocin ointment (BACTROBAN) 2 % USE IN BOTH NOSTRILS TWICE A DAY FOR 5 DAYS    . omeprazole (PRILOSEC) 40 MG capsule TAKE 1 CAPSULE BY MOUTH EVERY DAY (Patient taking differently: Take 40 mg by mouth daily. ) 90 capsule 1  . senna (SENOKOT) 8.6 MG TABS tablet Take 2 tablets (17.2 mg total) by mouth at bedtime. 60 tablet 1  . SPIRIVA RESPIMAT 1.25 MCG/ACT AERS TAKE 2 PUFFS BY MOUTH EVERY DAY    . traZODone (DESYREL) 100 MG tablet TAKE 1 TABLET BY MOUTH EVERYDAY AT BEDTIME 90 tablet 1  . apixaban (ELIQUIS) 2.5 MG TABS tablet Take 1 tablet (2.5 mg total) by mouth every 12 (twelve) hours. (Patient not  taking: Reported on 11/07/2019) 60 tablet 0  . docusate sodium (COLACE) 100 MG capsule Take 1 capsule (100 mg total) by mouth 2 (two) times daily. (Patient not taking: Reported on 11/07/2019) 60 capsule 1  . Menthol (ICY HOT BACK EXTRA STRENGTH) 5 % PTCH Apply 1 patch topically daily as needed (pain).    . ondansetron (ZOFRAN) 4 MG tablet Take 1 tablet (4 mg total) by mouth every 6 (six) hours as needed for nausea. (Patient not taking: Reported on 11/07/2019) 20 tablet 0  . pravastatin (PRAVACHOL) 40 MG tablet TAKE 1 TABLET BY MOUTH EVERY DAY (Patient not taking: Reported on 11/07/2019) 90 tablet 1  . tiZANidine  (ZANAFLEX) 2 MG tablet Take 1 tablet (2 mg total) by mouth every 6 (six) hours as needed for muscle spasms. (Patient not taking: Reported on 11/07/2019) 40 tablet 1  . traMADol (ULTRAM) 50 MG tablet Take 1 tablet (50 mg total) by mouth every 6 (six) hours as needed. (Patient not taking: Reported on 11/07/2019) 30 tablet 0   No current facility-administered medications for this visit.      Past Medical History:  Diagnosis Date  . Allergic rhinitis   . Arnold-Chiari malformation ([place])   . Asthma   . CAD (coronary artery disease)   . COPD (chronic obstructive pulmonary disease) ([place])   . Coughing    coughing since last 01-22-2019 , started on cefdinir bid x10 days, has 3 pills left today , reports she feels mcuh better , still coughing copiuus amonts of thick white sputum , denies fever nor chills, nor body aches .   Marland Kitchen DJD (degenerative joint disease)   . GERD (gastroesophageal reflux disease)   . History of absence seizures    last confirmed seizure age 12    . Hx of colonic polyps   . Hypercholesteremia   . Hypertension   . Obesity   . Positive PPD   . Reflex sympathetic dystrophy   . Varicose veins   . Venous insufficiency     Past Surgical History:  Procedure Laterality Date  . ABDOMINAL HYSTERECTOMY    . cspine surgery  1993   for arnold-chiari malformation  . JOINT REPLACEMENT  06/05/11   Left total knee replacement  . right knee arthroscopy  12/2007   Dr. Tonita Cong  . right total knee replacement  05/2008   Dr. Tonita Cong  . TOTAL KNEE REVISION Left 02/03/2019   Procedure: LEFT TOTAL KNEE REVISION;  Surgeon: Rod Can, MD;  Location: [facility];  Service: Orthopedics;  Laterality: Left;    Social History   Socioeconomic History  . Marital status: Widowed    Spouse name: Not on file  . Number of children: 2  . Years of education: 58  . Highest education level: Not on file  Occupational History  . Occupation: retired  Scientific laboratory technician  . Financial resource strain: Not on file   . Food insecurity    Worry: Not on file    Inability: Not on file  . Transportation needs    Medical: Not on file    Non-medical: Not on file  Tobacco Use  . Smoking status: Former Smoker    Packs/day: 0.30    Years: 12.00    Pack years: 3.60    Types: Cigarettes    Quit date: 01/02/2012    Years since quitting: 7.8  . Smokeless tobacco: Never Used  . Tobacco comment: 1 pack per week  Substance and Sexual Activity  . Alcohol use: No  .  Drug use: No  . Sexual activity: Not on file  Lifestyle  . Physical activity    Days per week: Not on file    Minutes per session: Not on file  . Stress: Not on file  Relationships  . Social Herbalist on phone: Not on file    Gets together: Not on file    Attends religious service: Not on file    Active member of club or organization: Not on file    Attends meetings of clubs or organizations: Not on file    Relationship status: Not on file  . Intimate partner violence    Fear of current or ex partner: Not on file    Emotionally abused: Not on file    Physically abused: Not on file    Forced sexual activity: Not on file  Other Topics Concern  . Not on file  Social History Narrative   Fun/Hobby: Playing cards and mingling.   Denies abuse and feels safe at home.     Family History  Problem Relation Age of Onset  . Heart disease Mother   . Stroke Mother   . Stroke Father   . Clotting disorder Father   . Colon cancer Neg Hx   . Stomach cancer Neg Hx     ROS: no fevers or chills, productive cough, hemoptysis, dysphasia, odynophagia, melena, hematochezia, dysuria, hematuria, rash, seizure activity, orthopnea, PND, claudication. Remaining systems are negative.  Physical Exam: Well-developed well-nourished in no acute distress.  Skin is warm and dry.  HEENT is normal.  Neck is supple.  Chest is clear to auscultation with normal expansion.  Cardiovascular exam is regular rate and rhythm.  Abdominal exam nontender or  distended. No masses palpated. Extremities show trace edema. neuro grossly intact  ECG-sinus bradycardia at a rate of 50, nonspecific ST changes.  Personally reviewed  A/P  1 coronary artery disease-patient denies recurrent chest pain.  Continue medical therapy.  Continue aspirin and statin.  2 hypertension-patient's blood pressure is controlled today.  Continue present medical regimen and follow.  3 hyperlipidemia-continue statin.  Check lipids and liver.  4 lower extremity edema-well-controlled at present.  Continue present dose of diuretic.  Check potassium and renal function.  Kirk Ruths, MD

## 2019-11-07 ENCOUNTER — Ambulatory Visit: Payer: BC Managed Care – PPO | Admitting: Cardiology

## 2019-11-07 ENCOUNTER — Other Ambulatory Visit: Payer: Self-pay

## 2019-11-07 ENCOUNTER — Encounter: Payer: Self-pay | Admitting: Cardiology

## 2019-11-07 VITALS — BP 124/84 | HR 50 | Temp 97.2°F | Ht 64.0 in | Wt 221.6 lb

## 2019-11-07 DIAGNOSIS — I5032 Chronic diastolic (congestive) heart failure: Secondary | ICD-10-CM | POA: Diagnosis not present

## 2019-11-07 DIAGNOSIS — I2510 Atherosclerotic heart disease of native coronary artery without angina pectoris: Secondary | ICD-10-CM | POA: Diagnosis not present

## 2019-11-07 DIAGNOSIS — E7800 Pure hypercholesterolemia, unspecified: Secondary | ICD-10-CM

## 2019-11-07 DIAGNOSIS — I10 Essential (primary) hypertension: Secondary | ICD-10-CM | POA: Diagnosis not present

## 2019-11-07 NOTE — Patient Instructions (Signed)
Medication Instructions:  NO CHANGE *If you need a refill on your cardiac medications before your next appointment, please call your pharmacy*  Lab Work: Your physician recommends that you HAVE LAB WORK TODAY If you have labs (blood work) drawn today and your tests are completely normal, you will receive your results only by: . MyChart Message (if you have MyChart) OR . A paper copy in the mail If you have any lab test that is abnormal or we need to change your treatment, we will call you to review the results.  Follow-Up: At [facility], you and your health needs are our priority.  As part of our continuing mission to provide you with exceptional heart care, we have created designated Provider Care Teams.  These Care Teams include your primary Cardiologist (physician) and Advanced Practice Providers (APPs -  Physician Assistants and Nurse Practitioners) who all work together to provide you with the care you need, when you need it.  Your next appointment:   12 month(s)  The format for your next appointment:   Either In Person or Virtual  Provider:   You may see Brian Crenshaw, MD or one of the following Advanced Practice Providers on your designated Care Team:    Luke Kilroy, PA-C  Callie Goodrich, PA-C  Jesse Cleaver, FNP    

## 2019-11-08 ENCOUNTER — Encounter: Payer: Self-pay | Admitting: *Deleted

## 2019-11-08 LAB — COMPREHENSIVE METABOLIC PANEL
ALT: 13 IU/L (ref 0–32)
AST: 18 IU/L (ref 0–40)
Albumin/Globulin Ratio: 1.6 (ref 1.2–2.2)
Albumin: 4.2 g/dL (ref 3.7–4.7)
Alkaline Phosphatase: 75 IU/L (ref 39–117)
BUN/Creatinine Ratio: 19 (ref 12–28)
BUN: 15 mg/dL (ref 8–27)
Bilirubin Total: 0.4 mg/dL (ref 0.0–1.2)
CO2: 26 mmol/L (ref 20–29)
Calcium: 9.4 mg/dL (ref 8.7–10.3)
Chloride: 103 mmol/L (ref 96–106)
Creatinine, Ser: 0.79 mg/dL (ref 0.57–1.00)
GFR calc Af Amer: 84 mL/min/{1.73_m2} (ref >59)
GFR calc non Af Amer: 73 mL/min/{1.73_m2} (ref >59)
Globulin, Total: 2.7 g/dL (ref 1.5–4.5)
Glucose: 81 mg/dL (ref 65–99)
Potassium: 4.4 mmol/L (ref 3.5–5.2)
Sodium: 142 mmol/L (ref 134–144)
Total Protein: 6.9 g/dL (ref 6.0–8.5)

## 2019-11-08 LAB — LIPID PANEL
Chol/HDL Ratio: 2.1 ratio (ref 0.0–4.4)
Cholesterol, Total: 144 mg/dL (ref 100–199)
HDL: 68 mg/dL (ref >39)
LDL Chol Calc (NIH): 57 mg/dL (ref 0–99)
Triglycerides: 107 mg/dL (ref 0–149)
VLDL Cholesterol Cal: 19 mg/dL (ref 5–40)

## 2019-11-20 ENCOUNTER — Other Ambulatory Visit: Payer: Self-pay | Admitting: Internal Medicine

## 2019-12-06 ENCOUNTER — Other Ambulatory Visit: Payer: Self-pay | Admitting: Internal Medicine

## 2019-12-06 NOTE — Telephone Encounter (Signed)
Done erx 

## 2019-12-07 ENCOUNTER — Other Ambulatory Visit: Payer: Self-pay | Admitting: Internal Medicine

## 2019-12-08 ENCOUNTER — Ambulatory Visit: Payer: BC Managed Care – PPO | Admitting: Vascular Surgery

## 2019-12-08 ENCOUNTER — Other Ambulatory Visit: Payer: Self-pay | Admitting: Internal Medicine

## 2019-12-08 NOTE — Telephone Encounter (Signed)
Please refill as per office routine med refill policy (all routine meds refilled for 3 mo or monthly per pt preference up to one year from last visit, then month to month grace period for 3 mo, then further med refills will have to be denied)  

## 2019-12-25 ENCOUNTER — Other Ambulatory Visit: Payer: Self-pay | Admitting: Internal Medicine

## 2019-12-25 NOTE — Telephone Encounter (Signed)
Please refill as per office routine med refill policy (all routine meds refilled for 3 mo or monthly per pt preference up to one year from last visit, then month to month grace period for 3 mo, then further med refills will have to be denied)  

## 2019-12-26 ENCOUNTER — Encounter: Payer: Self-pay | Admitting: Podiatry

## 2019-12-26 ENCOUNTER — Ambulatory Visit: Payer: Medicare Other | Admitting: Podiatry

## 2019-12-26 ENCOUNTER — Other Ambulatory Visit: Payer: Self-pay

## 2019-12-26 DIAGNOSIS — M79675 Pain in left toe(s): Secondary | ICD-10-CM

## 2019-12-26 DIAGNOSIS — M79674 Pain in right toe(s): Secondary | ICD-10-CM

## 2019-12-26 DIAGNOSIS — B351 Tinea unguium: Secondary | ICD-10-CM

## 2019-12-26 DIAGNOSIS — M79672 Pain in left foot: Secondary | ICD-10-CM | POA: Diagnosis not present

## 2019-12-26 DIAGNOSIS — Z89422 Acquired absence of other left toe(s): Secondary | ICD-10-CM

## 2019-12-26 DIAGNOSIS — M722 Plantar fascial fibromatosis: Secondary | ICD-10-CM

## 2019-12-26 NOTE — Patient Instructions (Addendum)
Resume wearing your plantar fascial brace to left heel.   Plantar Fasciitis (Heel Spur Syndrome) with Rehab The plantar fascia is a fibrous, ligament-like, soft-tissue structure that spans the bottom of the foot. Plantar fasciitis is a condition that causes pain in the foot due to inflammation of the tissue. SYMPTOMS   Pain and tenderness on the underneath side of the foot.  Pain that worsens with standing or walking. CAUSES  Plantar fasciitis is caused by irritation and injury to the plantar fascia on the underneath side of the foot. Common mechanisms of injury include:  Direct trauma to bottom of the foot.  Damage to a small nerve that runs under the foot where the main fascia attaches to the heel bone.  Stress placed on the plantar fascia due to bone spurs. RISK INCREASES WITH:   Activities that place stress on the plantar fascia (running, jumping, pivoting, or cutting).  Poor strength and flexibility.  Improperly fitted shoes.  Tight calf muscles.  Flat feet.  Failure to warm-up properly before activity.  Obesity. PREVENTION  Warm up and stretch properly before activity.  Allow for adequate recovery between workouts.  Maintain physical fitness:  Strength, flexibility, and endurance.  Cardiovascular fitness.  Maintain a health body weight.  Avoid stress on the plantar fascia.  Wear properly fitted shoes, including arch supports for individuals who have flat feet.  PROGNOSIS  If treated properly, then the symptoms of plantar fasciitis usually resolve without surgery. However, occasionally surgery is necessary.  RELATED COMPLICATIONS   Recurrent symptoms that may result in a chronic condition.  Problems of the lower back that are caused by compensating for the injury, such as limping.  Pain or weakness of the foot during push-off following surgery.  Chronic inflammation, scarring, and partial or complete fascia tear, occurring more often from repeated  injections.  TREATMENT  Treatment initially involves the use of ice and medication to help reduce pain and inflammation. The use of strengthening and stretching exercises may help reduce pain with activity, especially stretches of the Achilles tendon. These exercises may be performed at home or with a therapist. Your caregiver may recommend that you use heel cups of arch supports to help reduce stress on the plantar fascia. Occasionally, corticosteroid injections are given to reduce inflammation. If symptoms persist for greater than 6 months despite non-surgical (conservative), then surgery may be recommended.   MEDICATION   If pain medication is necessary, then nonsteroidal anti-inflammatory medications, such as aspirin and ibuprofen, or other minor pain relievers, such as acetaminophen, are often recommended.  Do not take pain medication within 7 days before surgery.  Prescription pain relievers may be given if deemed necessary by your caregiver. Use only as directed and only as much as you need.  Corticosteroid injections may be given by your caregiver. These injections should be reserved for the most serious cases, because they may only be given a certain number of times.  HEAT AND COLD  Cold treatment (icing) relieves pain and reduces inflammation. Cold treatment should be applied for 10 to 15 minutes every 2 to 3 hours for inflammation and pain and immediately after any activity that aggravates your symptoms. Use ice packs or massage the area with a piece of ice (ice massage).  Heat treatment may be used prior to performing the stretching and strengthening activities prescribed by your caregiver, physical therapist, or athletic trainer. Use a heat pack or soak the injury in warm water.  SEEK IMMEDIATE MEDICAL CARE IF:  Treatment seems  to offer no benefit, or the condition worsens.  Any medications produce adverse side effects.  EXERCISES- RANGE OF MOTION (ROM) AND STRETCHING  EXERCISES - Plantar Fasciitis (Heel Spur Syndrome) These exercises may help you when beginning to rehabilitate your injury. Your symptoms may resolve with or without further involvement from your physician, physical therapist or athletic trainer. While completing these exercises, remember:   Restoring tissue flexibility helps normal motion to return to the joints. This allows healthier, less painful movement and activity.  An effective stretch should be held for at least 30 seconds.  A stretch should never be painful. You should only feel a gentle lengthening or release in the stretched tissue.  RANGE OF MOTION - Toe Extension, Flexion  Sit with your right / left leg crossed over your opposite knee.  Grasp your toes and gently pull them back toward the top of your foot. You should feel a stretch on the bottom of your toes and/or foot.  Hold this stretch for 10 seconds.  Now, gently pull your toes toward the bottom of your foot. You should feel a stretch on the top of your toes and or foot.  Hold this stretch for 10 seconds. Repeat  times. Complete this stretch 3 times per day.   RANGE OF MOTION - Ankle Dorsiflexion, Active Assisted  Remove shoes and sit on a chair that is preferably not on a carpeted surface.  Place right / left foot under knee. Extend your opposite leg for support.  Keeping your heel down, slide your right / left foot back toward the chair until you feel a stretch at your ankle or calf. If you do not feel a stretch, slide your bottom forward to the edge of the chair, while still keeping your heel down.  Hold this stretch for 10 seconds. Repeat 3 times. Complete this stretch 2 times per day.   STRETCH  Gastroc, Standing  Place hands on wall.  Extend right / left leg, keeping the front knee somewhat bent.  Slightly point your toes inward on your back foot.  Keeping your right / left heel on the floor and your knee straight, shift your weight toward the wall,  not allowing your back to arch.  You should feel a gentle stretch in the right / left calf. Hold this position for 10 seconds. Repeat 3 times. Complete this stretch 2 times per day.  STRETCH  Soleus, Standing  Place hands on wall.  Extend right / left leg, keeping the other knee somewhat bent.  Slightly point your toes inward on your back foot.  Keep your right / left heel on the floor, bend your back knee, and slightly shift your weight over the back leg so that you feel a gentle stretch deep in your back calf.  Hold this position for 10 seconds. Repeat 3 times. Complete this stretch 2 times per day.  STRETCH  Gastrocsoleus, Standing  Note: This exercise can place a lot of stress on your foot and ankle. Please complete this exercise only if specifically instructed by your caregiver.   Place the ball of your right / left foot on a step, keeping your other foot firmly on the same step.  Hold on to the wall or a rail for balance.  Slowly lift your other foot, allowing your body weight to press your heel down over the edge of the step.  You should feel a stretch in your right / left calf.  Hold this position for 10 seconds.  Repeat this exercise with a slight bend in your right / left knee. Repeat 3 times. Complete this stretch 2 times per day.   STRENGTHENING EXERCISES - Plantar Fasciitis (Heel Spur Syndrome)  These exercises may help you when beginning to rehabilitate your injury. They may resolve your symptoms with or without further involvement from your physician, physical therapist or athletic trainer. While completing these exercises, remember:   Muscles can gain both the endurance and the strength needed for everyday activities through controlled exercises.  Complete these exercises as instructed by your physician, physical therapist or athletic trainer. Progress the resistance and repetitions only as guided.  STRENGTH - Towel Curls  Sit in a chair positioned on a  non-carpeted surface.  Place your foot on a towel, keeping your heel on the floor.  Pull the towel toward your heel by only curling your toes. Keep your heel on the floor. Repeat 3 times. Complete this exercise 2 times per day.  STRENGTH - Ankle Inversion  Secure one end of a rubber exercise band/tubing to a fixed object (table, pole). Loop the other end around your foot just before your toes.  Place your fists between your knees. This will focus your strengthening at your ankle.  Slowly, pull your big toe up and in, making sure the band/tubing is positioned to resist the entire motion.  Hold this position for 10 seconds.  Have your muscles resist the band/tubing as it slowly pulls your foot back to the starting position. Repeat 3 times. Complete this exercises 2 times per day.  Document Released: 11/17/2005 Document Revised: 02/09/2012 Document Reviewed: 03/01/2009 [facility] Patient Information 2014 [place], [place].  Plantar Fasciitis  Plantar fasciitis is a painful foot condition that affects the heel. It occurs when the band of tissue that connects the toes to the heel bone (plantar fascia) becomes irritated. This can happen as the result of exercising too much or doing other repetitive activities (overuse injury). The pain from plantar fasciitis can range from mild irritation to severe pain that makes it difficult to walk or move. The pain is usually worse in the morning after sleeping, or after sitting or lying down for a while. Pain may also be worse after long periods of walking or standing. What are the causes? This condition may be caused by:  Standing for long periods of time.  Wearing shoes that do not have good arch support.  Doing activities that put stress on joints (high-impact activities), including running, aerobics, and ballet.  Being overweight.  An abnormal way of walking (gait).  Tight muscles in the back of your lower leg (calf).  High arches in your  feet.  Starting a new athletic activity. What are the signs or symptoms? The main symptom of this condition is heel pain. Pain may:  Be worse with first steps after a time of rest, especially in the morning after sleeping or after you have been sitting or lying down for a while.  Be worse after long periods of standing still.  Decrease after 30-45 minutes of activity, such as gentle walking. How is this diagnosed? This condition may be diagnosed based on your medical history and your symptoms. Your health care provider may ask questions about your activity level. Your health care provider will do a physical exam to check for:  A tender area on the bottom of your foot.  A high arch in your foot.  Pain when you move your foot.  Difficulty moving your foot. You may have imaging  tests to confirm the diagnosis, such as:  X-rays.  Ultrasound.  MRI. How is this treated? Treatment for plantar fasciitis depends on how severe your condition is. Treatment may include:  Rest, ice, applying pressure (compression), and raising the affected foot (elevation). This may be called RICE therapy. Your health care provider may recommend RICE therapy along with over-the-counter pain medicines to manage your pain.  Exercises to stretch your calves and your plantar fascia.  A splint that holds your foot in a stretched, upward position while you sleep (night splint).  Physical therapy to relieve symptoms and prevent problems in the future.  Injections of steroid medicine (cortisone) to relieve pain and inflammation.  Stimulating your plantar fascia with electrical impulses (extracorporeal shock wave therapy). This is usually the last treatment option before surgery.  Surgery, if other treatments have not worked after 12 months. Follow these instructions at home:  Managing pain, stiffness, and swelling  If directed, put ice on the painful area: ? Put ice in a plastic bag, or use a frozen bottle  of water. ? Place a towel between your skin and the bag or bottle. ? Roll the bottom of your foot over the bag or bottle. ? Do this for 20 minutes, 2-3 times a day.  Wear athletic shoes that have air-sole or gel-sole cushions, or try wearing soft shoe inserts that are designed for plantar fasciitis.  Raise (elevate) your foot above the level of your heart while you are sitting or lying down. Activity  Avoid activities that cause pain. Ask your health care provider what activities are safe for you.  Do physical therapy exercises and stretches as told by your health care provider.  Try activities and forms of exercise that are easier on your joints (low-impact). Examples include swimming, water aerobics, and biking. General instructions  Take over-the-counter and prescription medicines only as told by your health care provider.  Wear a night splint while sleeping, if told by your health care provider. Loosen the splint if your toes tingle, become numb, or turn cold and blue.  Maintain a healthy weight, or work with your health care provider to lose weight as needed.  Keep all follow-up visits as told by your health care provider. This is important. Contact a health care provider if you:  Have symptoms that do not go away after caring for yourself at home.  Have pain that gets worse.  Have pain that affects your ability to move or do your daily activities. Summary  Plantar fasciitis is a painful foot condition that affects the heel. It occurs when the band of tissue that connects the toes to the heel bone (plantar fascia) becomes irritated.  The main symptom of this condition is heel pain that may be worse after exercising too much or standing still for a long time.  Treatment varies, but it usually starts with rest, ice, compression, and elevation (RICE therapy) and over-the-counter medicines to manage pain. This information is not intended to replace advice given to you by your  health care provider. Make sure you discuss any questions you have with your health care provider. Document Revised: 10/30/2017 Document Reviewed: 09/14/2017 Elsevier Patient Education  [place]  WHAT IS IT? An infection that lies within the keratin of your nail plate that is caused by a fungus.  WHY ME? Fungal infections affect all ages, sexes, races, and creeds.  There may be many factors that predispose you to a fungal infection such as  age, coexisting medical conditions such as diabetes, or an autoimmune disease; stress, medications, fatigue, genetics, etc.  Bottom line: fungus thrives in a warm, moist environment and your shoes offer such a location.  IS IT CONTAGIOUS? Theoretically, yes.  You do not want to share shoes, nail clippers or files with someone who has fungal toenails.  Walking around barefoot in the same room or sleeping in the same bed is unlikely to transfer the organism.  It is important to realize, however, that fungus can spread easily from one nail to the next on the same foot.  HOW DO WE TREAT THIS?  There are several ways to treat this condition.  Treatment may depend on many factors such as age, medications, pregnancy, liver and kidney conditions, etc.  It is best to ask your doctor which options are available to you.  9. No treatment.   Unlike many other medical concerns, you can live with this condition.  However for many people this can be a painful condition and may lead to ingrown toenails or a bacterial infection.  It is recommended that you keep the nails cut short to help reduce the amount of fungal nail. 10. Topical treatment.  These range from herbal remedies to prescription strength nail lacquers.  About 40-50% effective, topicals require twice daily application for approximately 9 to 12 months or until an entirely new nail has grown out.  The most effective topicals are medical grade medications available through  physicians offices. 11. Oral antifungal medications.  With an 80-90% cure rate, the most common oral medication requires 3 to 4 months of therapy and stays in your system for a year as the new nail grows out.  Oral antifungal medications do require blood work to make sure it is a safe drug for you.  A liver function panel will be performed prior to starting the medication and after the first month of treatment.  It is important to have the blood work performed to avoid any harmful side effects.  In general, this medication safe but blood work is required. 12. Laser Therapy.  This treatment is performed by applying a specialized laser to the affected nail plate.  This therapy is noninvasive, fast, and non-painful.  It is not covered by insurance and is therefore, out of pocket.  The results have been very good with a 80-95% cure rate.  [facility] is the only practice in the area to offer this therapy. 13. Permanent Nail Avulsion.  Removing the entire nail so that a new nail will not grow back.

## 2019-12-31 NOTE — Progress Notes (Signed)
Subjective: [name] presents today for follow up of follow up plantar fasciitis left heel and painful mycotic nails b/l that are difficult to trim. Pain interferes with ambulation. Aggravating factors include wearing enclosed shoe gear. Pain is relieved with periodic professional debridement.   Patient states left heel is more painful today, 10/10 with first step in the morning and after periods of rest. She has not been wearing her plantar fascial brace.  No Known Allergies   Objective: There were no vitals filed for this visit.  Vascular Examination:  Capillary refill time to digits immediate b/l, palpable DP pulses b/l, palpable PT pulses b/l, pedal hair sparse b/l and skin temperature gradient within normal limits b/l  Dermatological Examination: Pedal skin with normal turgor, texture and tone bilaterally, no open wounds bilaterally, no interdigital macerations bilaterally and toenails 1, 4, 5 left, 1,3, 4, 5 right elongated, dystrophic, thickened, crumbly with subungual debris  Anonychia left 3rd, right 2nd toe(s) with evidence of permanent total nail avulsion. Nailbed(s) completely epithelialized and intact.   Musculoskeletal: Normal muscle strength 5/5 to all lower extremity muscle groups bilaterally, no gross bony deformities bilaterally, no pain crepitus or joint limitation noted with ROM b/l, digital amputation left, 2nd toe and pain on palpation medial tubercle left heel  Neurological: Protective sensation intact 5/5 intact bilaterally with 10g monofilament b/l and vibratory sensation intact b/l  Assessment: 1. Plantar fasciitis of left foot   2. Pain in left foot   3. Pain due to onychomycosis of toenails of both feet   4. Status post amputation of lesser toe of left foot (HCC)     Plan: -Toenails 1, 4, 5 left, 1, 3, 4, 5 right were debrided in length and girth without iatrogenic bleeding. -Discussed etiology, pathology, conservative vs. surgical therapies. At this  time a plantar fascial injection was recommended.  The patient agreed and a sterile skin prep was applied.  An injection consisting of 1 cc Celestone Soluspan and 1 cc 0.5 % marcaine plain mixture was infiltrated at the point of maximal tenderness on the left heel.  The patient tolerated this well and was given instructions for aftercare.  -She is to continue wearing her plantar fascial brace daily. -Patient to continue soft, supportive shoe gear daily. -Patient to report any pedal injuries to medical professional immediately. -Patient/POA to call should there be question/concern in the interim.  Return in about 3 months (around 03/25/2020) for nail trim/ Eliquis.

## 2020-01-06 ENCOUNTER — Telehealth: Payer: Self-pay | Admitting: *Deleted

## 2020-01-06 NOTE — Telephone Encounter (Signed)
Patient called and stated that she got a shot on 12/26/2019 and only lasted 3 days and foot does hurt and the brace helps and the heel is sore and hurts all the time and takes tylenol which hurts and I stated I would send Dr Heber [place] nurse a message and Nira Conn would be getting back with you by end of the day. Lattie Haw

## 2020-01-07 ENCOUNTER — Telehealth (INDEPENDENT_AMBULATORY_CARE_PROVIDER_SITE_OTHER): Payer: BC Managed Care – PPO | Admitting: Podiatry

## 2020-01-07 DIAGNOSIS — M722 Plantar fascial fibromatosis: Secondary | ICD-10-CM

## 2020-01-07 NOTE — Telephone Encounter (Signed)
Phoned patient at 10:01 a.m.  regarding continued pain in left heel. Says shot worked for about 3 days. She has been wearing plantar fascial brace and house slippers. Instructed her to wear her sneakers and plantar fascial brace during the day at home and she may wear her house slippers at night. Advised patient if she continues to have heel pain, physical therapy is an option. Before entertaining physical therapy, she would like to try wearing her sneakers and plantar fascial brace around the house during the day and will update me as to whether it works. Patient appeared satisfied with this plan and will keep me updated.

## 2020-01-27 ENCOUNTER — Ambulatory Visit: Payer: BC Managed Care – PPO | Attending: Internal Medicine

## 2020-01-27 DIAGNOSIS — Z23 Encounter for immunization: Secondary | ICD-10-CM

## 2020-01-27 NOTE — Progress Notes (Signed)
   Covid-19 Vaccination Clinic  Name:  [name]    MRN: [id number] DOB: Dec 04, 1940  01/27/2020  [name] was observed post Covid-19 immunization for 15 minutes without incidence. She was provided with Vaccine Information Sheet and instruction to access the V-Safe system.   [name] was instructed to call 911 with any severe reactions post vaccine: Marland Kitchen Difficulty breathing  . Swelling of your face and throat  . A fast heartbeat  . A bad rash all over your body  . Dizziness and weakness    Immunizations Administered    Name Date Dose VIS Date Route   Pfizer COVID-19 Vaccine 01/27/2020 12:08 PM 0.3 mL 11/11/2019 Intramuscular   Manufacturer: [place]   Lot: [id number]   [place]: [id number]

## 2020-02-22 ENCOUNTER — Ambulatory Visit: Payer: BC Managed Care – PPO | Attending: Internal Medicine

## 2020-02-22 DIAGNOSIS — Z23 Encounter for immunization: Secondary | ICD-10-CM

## 2020-02-22 NOTE — Progress Notes (Signed)
   Covid-19 Vaccination Clinic  Name:  [name]    MRN: [id number] DOB: 27-Aug-1941  02/22/2020  [name] was observed post Covid-19 immunization for 15 minutes without incident. She was provided with Vaccine Information Sheet and instruction to access the V-Safe system.   [name] was instructed to call 911 with any severe reactions post vaccine: Marland Kitchen Difficulty breathing  . Swelling of face and throat  . A fast heartbeat  . A bad rash all over body  . Dizziness and weakness   Immunizations Administered    Name Date Dose VIS Date Route   Pfizer COVID-19 Vaccine 02/22/2020 10:30 AM 0.3 mL 11/11/2019 Intramuscular   Manufacturer: [place]   Lot: [id number]   [place]: [id number]

## 2020-03-01 ENCOUNTER — Ambulatory Visit (INDEPENDENT_AMBULATORY_CARE_PROVIDER_SITE_OTHER): Payer: BC Managed Care – PPO | Admitting: Internal Medicine

## 2020-03-01 DIAGNOSIS — J4520 Mild intermittent asthma, uncomplicated: Secondary | ICD-10-CM | POA: Diagnosis not present

## 2020-03-01 DIAGNOSIS — F411 Generalized anxiety disorder: Secondary | ICD-10-CM | POA: Diagnosis not present

## 2020-03-01 DIAGNOSIS — J309 Allergic rhinitis, unspecified: Secondary | ICD-10-CM | POA: Diagnosis not present

## 2020-03-01 DIAGNOSIS — J449 Chronic obstructive pulmonary disease, unspecified: Secondary | ICD-10-CM

## 2020-03-01 MED ORDER — PREDNISONE 10 MG PO TABS: ORAL_TABLET | ORAL | 0 refills | Status: DC

## 2020-03-01 NOTE — Progress Notes (Addendum)
Patient ID: CATHY RAVERT, female   DOB: [birth year], 79 y.o.   MRN: [id number]  Cumulative time during 7-day interval 12 min, there was not an associated office visit for this concern within a 7 day period.  Pt at home; I am at office  Verbal consent for services obtained from patient prior to services given.  Names of all persons present for services: Cathlean Cower, MD, patient  Chief complaint: allergies  History, background, results pertinent:  Here to f/u; overall doing ok,  Pt denies chest pain, increasing sob or doe, wheezing, orthopnea, PND, increased LE swelling, palpitations, dizziness or syncope.  Pt denies new neurological symptoms such as new headache, or facial or extremity weakness or numbness.  Pt denies polydipsia, polyuria, or low sugar episode.  Pt states overall good compliance with meds, mostly trying to follow appropriate diet, with wt overall stable,  but little exercise however.  Does have several wks ongoing nasal allergy symptoms with clearish congestion, itch and sneezing, without fever, pain, ST, cough, swelling or wheezing.  Denies worsening depressive symptoms, suicidal ideation, or panic; has ongoing anxiety, stable Past Medical History:  Diagnosis Date  . Allergic rhinitis   . Arnold-Chiari malformation ([place])   . Asthma   . CAD (coronary artery disease)   . COPD (chronic obstructive pulmonary disease) ([place])   . Coughing    coughing since last 01-22-2019 , started on cefdinir bid x10 days, has 3 pills left today , reports she feels mcuh better , still coughing copiuus amonts of thick white sputum , denies fever nor chills, nor body aches .   Marland Kitchen DJD (degenerative joint disease)   . GERD (gastroesophageal reflux disease)   . History of absence seizures    last confirmed seizure age 20    . Hx of colonic polyps   . Hypercholesteremia   . Hypertension   . Obesity   . Positive PPD   . Reflex sympathetic dystrophy   . Varicose veins   . Venous insufficiency    No  results found for this or any previous visit (from the past 48 hour(s)). A/P/next steps:   See notes  Cathlean Cower MD

## 2020-03-02 ENCOUNTER — Other Ambulatory Visit: Payer: Self-pay | Admitting: Internal Medicine

## 2020-03-02 ENCOUNTER — Encounter: Payer: Self-pay | Admitting: Internal Medicine

## 2020-03-02 NOTE — Assessment & Plan Note (Signed)
stable overall by history and exam, recent data reviewed with pt, and pt to continue medical treatment as before,  to f/u any worsening symptoms or concerns  

## 2020-03-02 NOTE — Assessment & Plan Note (Addendum)
Mild to mod, for predpac asd,  to f/u any worsening symptoms or concerns  I spent 12 minutes in preparing to see the patient by review of recent labs, imaging and procedures, obtaining and reviewing separately obtained history, communicating with the patient and family or caregiver, ordering medications, tests or procedures, and documenting clinical information in the EHR including the differential Dx, treatment, and any further evaluation and other management of allergies, asthma, copd, anxiety

## 2020-03-02 NOTE — Patient Instructions (Signed)
Please take all new medication as prescribed 

## 2020-03-02 NOTE — Telephone Encounter (Signed)
Please refill as per office routine med refill policy (all routine meds refilled for 3 mo or monthly per pt preference up to one year from last visit, then month to month grace period for 3 mo, then further med refills will have to be denied)  

## 2020-03-02 NOTE — Addendum Note (Signed)
Addended by: Biagio Borg on: 03/02/2020 02:29 PM   Modules accepted: Level of Service

## 2020-03-11 ENCOUNTER — Other Ambulatory Visit: Payer: Self-pay | Admitting: Internal Medicine

## 2020-03-11 NOTE — Telephone Encounter (Signed)
Please refill as per office routine med refill policy (all routine meds refilled for 3 mo or monthly per pt preference up to one year from last visit, then month to month grace period for 3 mo, then further med refills will have to be denied)  

## 2020-03-21 ENCOUNTER — Ambulatory Visit: Payer: BC Managed Care – PPO | Attending: Internal Medicine

## 2020-03-21 ENCOUNTER — Other Ambulatory Visit: Payer: BC Managed Care – PPO

## 2020-03-21 DIAGNOSIS — Z20822 Contact with and (suspected) exposure to covid-19: Secondary | ICD-10-CM

## 2020-03-22 ENCOUNTER — Telehealth: Payer: Self-pay | Admitting: Internal Medicine

## 2020-03-22 LAB — SARS-COV-2, NAA 2 DAY TAT

## 2020-03-22 LAB — NOVEL CORONAVIRUS, NAA: SARS-CoV-2, NAA: NOT DETECTED

## 2020-03-22 NOTE — Telephone Encounter (Signed)
Pt aware covid test results negative

## 2020-03-24 ENCOUNTER — Other Ambulatory Visit: Payer: Self-pay | Admitting: Internal Medicine

## 2020-03-24 NOTE — Telephone Encounter (Signed)
Please refill as per office routine med refill policy (all routine meds refilled for 3 mo or monthly per pt preference up to one year from last visit, then month to month grace period for 3 mo, then further med refills will have to be denied)  

## 2020-03-26 ENCOUNTER — Other Ambulatory Visit: Payer: Self-pay

## 2020-03-26 ENCOUNTER — Ambulatory Visit (INDEPENDENT_AMBULATORY_CARE_PROVIDER_SITE_OTHER): Payer: BC Managed Care – PPO | Admitting: Podiatry

## 2020-03-26 ENCOUNTER — Encounter: Payer: Self-pay | Admitting: Podiatry

## 2020-03-26 DIAGNOSIS — B351 Tinea unguium: Secondary | ICD-10-CM | POA: Diagnosis not present

## 2020-03-26 DIAGNOSIS — M79675 Pain in left toe(s): Secondary | ICD-10-CM | POA: Diagnosis not present

## 2020-03-26 DIAGNOSIS — Z89422 Acquired absence of other left toe(s): Secondary | ICD-10-CM | POA: Diagnosis not present

## 2020-03-26 DIAGNOSIS — M79674 Pain in right toe(s): Secondary | ICD-10-CM | POA: Diagnosis not present

## 2020-03-26 NOTE — Patient Instructions (Signed)

## 2020-03-30 NOTE — Progress Notes (Signed)
Subjective: [name] presents today for follow up of at risk foot care. Patient has h/o amputation of L 2nd toe and painful mycotic nails b/l that are difficult to trim. Pain interferes with ambulation. Aggravating factors include wearing enclosed shoe gear. Pain is relieved with periodic professional debridement.   No Known Allergies   Objective: There were no vitals filed for this visit.  Pt 79 y.o. year old female  in NAD. AAO x 3.   Vascular Examination:  Capillary refill time to digits immediate b/l. Palpable DP pulses b/l. Palpable PT pulses b/l. Pedal hair sparse b/l. Skin temperature gradient within normal limits b/l.  Dermatological Examination: Pedal skin with normal turgor, texture and tone bilaterally. No open wounds bilaterally. No interdigital macerations bilaterally. Toenails L hallux, L 4th toe, L 5th toe, R hallux, R 3rd toe, R 4th toe and R 5th toe elongated, dystrophic, thickened, and crumbly with subungual debris and tenderness to dorsal palpation. Anonychia noted L 3rd toe and R 2nd toe. Nailbed(s) epithelialized.   Musculoskeletal: Normal muscle strength 5/5 to all lower extremity muscle groups bilaterally. No gross bony deformities bilaterally. No pain crepitus or joint limitation noted with ROM b/l. digital amputation L 2nd toe.  Neurological: Protective sensation intact 5/5 intact bilaterally with 10g monofilament b/l. Vibratory sensation intact b/l. Proprioception intact bilaterally.  Assessment: 1. Pain due to onychomycosis of toenails of both feet   2. Status post amputation of lesser toe, left (HCC)    Plan: -Toenails L hallux, L 4th toe, L 5th toe, R hallux, R 3rd toe, R 4th toe and R 5th toe debrided in length and girth without iatrogenic bleeding with sterile nail nipper and dremel.  -Patient to continue soft, supportive shoe gear daily. -Patient to report any pedal injuries to medical professional immediately. -Patient/POA to call should there be  question/concern in the interim.  Return in about 3 months (around 06/25/2020) for nail trim.

## 2020-05-07 DIAGNOSIS — M7051 Other bursitis of knee, right knee: Secondary | ICD-10-CM | POA: Insufficient documentation

## 2020-06-08 ENCOUNTER — Ambulatory Visit: Payer: BC Managed Care – PPO | Admitting: Student

## 2020-06-20 NOTE — Progress Notes (Signed)
Cardiology Clinic Note   Patient Name: [name] Date of Encounter: 06/21/2020  Primary Care Provider:  Biagio Borg, MD Primary Cardiologist:  Kirk Ruths, MD  Patient Profile    [name] 79 year old female presents to the clinic today for follow-up evaluation of her essential hypertension CAD, and venous insufficiency.  Past Medical History    Past Medical History:  Diagnosis Date  . Allergic rhinitis   . Arnold-Chiari malformation ([place])   . Asthma   . CAD (coronary artery disease)   . COPD (chronic obstructive pulmonary disease) ([place])   . Coughing    coughing since last 01-22-2019 , started on cefdinir bid x10 days, has 3 pills left today , reports she feels mcuh better , still coughing copiuus amonts of thick white sputum , denies fever nor chills, nor body aches .   Marland Kitchen DJD (degenerative joint disease)   . GERD (gastroesophageal reflux disease)   . History of absence seizures    last confirmed seizure age 26    . Hx of colonic polyps   . Hypercholesteremia   . Hypertension   . Obesity   . Positive PPD   . Reflex sympathetic dystrophy   . Varicose veins   . Venous insufficiency    Past Surgical History:  Procedure Laterality Date  . ABDOMINAL HYSTERECTOMY    . cspine surgery  1993   for arnold-chiari malformation  . JOINT REPLACEMENT  06/05/11   Left total knee replacement  . right knee arthroscopy  12/2007   Dr. Tonita Cong  . right total knee replacement  05/2008   Dr. Tonita Cong  . TOTAL KNEE REVISION Left 02/03/2019   Procedure: LEFT TOTAL KNEE REVISION;  Surgeon: Rod Can, MD;  Location: [facility];  Service: Orthopedics;  Laterality: Left;    Allergies  No Known Allergies  History of Present Illness    [name] has a PMH of essential hypertension, CAD (cardiac catheterization 2/5 showed mild irregularities in left main, 40% stenosis of proximal LAD, distal 30% lesion, first diagonal 30-40% proximal lesion, ostium of LAD 60% stenosis, with  nonobstructive disease in left circumflex and RCA.), venous insufficiency, diastolic CHF, PVD, COPD, GERD, chest pain, edema, right arm pain, positive PPD, vertigo, and bilateral knee replacement.  Nuclear stress test 1/16 showed EF of 58% and normal perfusion.  Echocardiogram 4/19 showed normal LV function, mild left ventricular hypertrophy, G1 DD, mild right atrial and right ventricular enlargement and mild tricuspid regurgitation.  She was seen 5/19 for lower extremity edema.  Her Lasix had recently been increased.  ABIs 5/19 were normal.  She was last seen by Dr. [name] 11/07/2019.  She was noted to have dyspnea with more extreme activities but not with normal/routine activities.  Her increased DOE was relieved with rest.  She had no associated chest pain.  She denied orthopnea, PND, increased lower extremity edema, syncope and palpitations.  She denied exertional chest pain.  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  She also does housework at home.  She has been unable to exercise regularly due to some right leg pain.  However, she is doing physical therapy 1 day/week and continues her physical therapy exercises at home on off days.  She states that she has had some dietary indiscretion due to having several visitors at her house and preparing food.  She states that she is working on her diet.  She also  notices that on occasion she can feel her heartbeat in her neck.  She states this is not bothersome but she does notice it.  It does not keep her awake at night and she states that she only notices it when she thinks about her heartbeat.  I will have her follow-up with Dr. [name] in 6 months, increase physical activity as tolerated, give her the salty 6 diet sheet, and have reassured her that her heart rate is only mildly slow and normal.  Today she denies chest pain, shortness of breath, lower extremity edema, fatigue,  palpitations, melena, hematuria, hemoptysis, diaphoresis, weakness, presyncope, syncope, orthopnea, and PND.   Home Medications    Prior to Admission medications   Medication Sig Start Date End Date Taking? Authorizing Provider  albuterol (PROVENTIL HFA;VENTOLIN HFA) 108 (90 BASE) MCG/ACT inhaler Inhale 2 puffs into the lungs every 4 (four) hours as needed. For wheeze or shortness of breath Patient taking differently: Inhale 2 puffs into the lungs every 4 (four) hours as needed for wheezing or shortness of breath.  03/26/12   Noralee Space, MD  alendronate (FOSAMAX) 70 MG tablet TAKE 1 TABLET (70 MG TOTAL) BY MOUTH EVERY 7 (SEVEN) DAYS. TAKE WITH A FULL GLASS OF WATER ON AN EMPTY STOMACH. 12/07/19   Biagio Borg, MD  apixaban (ELIQUIS) 2.5 MG TABS tablet Take 1 tablet (2.5 mg total) by mouth every 12 (twelve) hours. Patient not taking: Reported on 11/07/2019 02/04/19   Rod Can, MD  aspirin 81 MG tablet Take 81 mg by mouth daily.      [provider]  azelastine (ASTELIN) 0.1 % nasal spray Place 1 spray into both nostrils daily as needed for rhinitis or allergies.  11/07/18   [provider]  budesonide-formoterol (SYMBICORT) 160-4.5 MCG/ACT inhaler Inhale 2 puffs into the lungs 2 (two) times daily. 06/19/16 11/07/19  Noralee Space, MD  docusate sodium (COLACE) 100 MG capsule Take 1 capsule (100 mg total) by mouth 2 (two) times daily. Patient not taking: Reported on 11/07/2019 02/04/19   Rod Can, MD  EPINEPHrine 0.3 mg/0.3 mL IJ SOAJ injection epinephrine 0.3 mg/0.3 mL injection, auto-injector    [provider]  ferrous sulfate 325 (65 FE) MG EC tablet Take 325 mg by mouth daily.    [provider]  fluticasone (FLONASE) 50 MCG/ACT nasal spray Place 1 spray into both nostrils 2 (two) times daily as needed for allergies or rhinitis.    [provider]  furosemide (LASIX) 40 MG tablet Take 2 tablets (80 mg total) by mouth daily. OFFICE VISIT IS  DUE FOR REFILLS 12/26/19   Biagio Borg, MD  KLOR-CON M20 20 MEQ tablet TAKE 1 TABLET BY MOUTH EVERY DAY 03/26/20   Biagio Borg, MD  meloxicam (MOBIC) 15 MG tablet TAKE 1 TABLET BY MOUTH EVERY DAY AS NEEDED FOR PAIN 03/06/20   Biagio Borg, MD  Menthol (ICY HOT BACK EXTRA STRENGTH) 5 % [facility] Apply 1 patch topically daily as needed (pain).    [provider]  montelukast (SINGULAIR) 10 MG tablet Take 10 mg by mouth every evening.    [provider]  mupirocin ointment (BACTROBAN) 2 % USE IN BOTH NOSTRILS TWICE A DAY FOR 5 DAYS 01/28/19   [provider]  omeprazole (PRILOSEC) 20 MG capsule Take 20 mg by mouth daily. 11/30/19   [provider]  omeprazole (PRILOSEC) 40 MG capsule TAKE 1 CAPSULE BY MOUTH EVERY DAY Patient taking differently: Take 40  mg by mouth daily.  01/03/19   Biagio Borg, MD  ondansetron (ZOFRAN) 4 MG tablet Take 1 tablet (4 mg total) by mouth every 6 (six) hours as needed for nausea. Patient not taking: Reported on 11/07/2019 02/04/19   Rod Can, MD  pravastatin (PRAVACHOL) 40 MG tablet TAKE 1 TABLET BY MOUTH EVERY DAY 03/12/20   Biagio Borg, MD  predniSONE (DELTASONE) 10 MG tablet 3 tabs by mouth per day for 3 days,2tabs per day for 3 days,1tab per day for 3 days 03/01/20   Biagio Borg, MD  senna (SENOKOT) 8.6 MG TABS tablet Take 2 tablets (17.2 mg total) by mouth at bedtime. 02/04/19   Swinteck, Aaron Edelman, MD  SPIRIVA RESPIMAT 1.25 MCG/ACT AERS TAKE 2 PUFFS BY MOUTH EVERY DAY 03/19/19   [provider]  tiZANidine (ZANAFLEX) 2 MG tablet Take 1 tablet (2 mg total) by mouth every 6 (six) hours as needed for muscle spasms. Patient not taking: Reported on 11/07/2019 08/19/19   Biagio Borg, MD  traMADol (ULTRAM) 50 MG tablet Take 1 tablet (50 mg total) by mouth every 6 (six) hours as needed. Patient not taking: Reported on 11/07/2019 08/19/19   Biagio Borg, MD  traZODone (DESYREL) 100 MG tablet TAKE 1 TABLET BY MOUTH EVERYDAY AT BEDTIME  12/06/19   Biagio Borg, MD    Family History    Family History  Problem Relation Age of Onset  . Heart disease Mother   . Stroke Mother   . Stroke Father   . Clotting disorder Father   . Colon cancer Neg Hx   . Stomach cancer Neg Hx    She indicated that her mother is deceased. She indicated that her father is alive. She indicated that the status of her neg hx is unknown.  Social History    Social History   Socioeconomic History  . Marital status: Widowed    Spouse name: Not on file  . Number of children: 2  . Years of education: 43  . Highest education level: Not on file  Occupational History  . Occupation: retired  Tobacco Use  . Smoking status: Former Smoker    Packs/day: 0.30    Years: 12.00    Pack years: 3.60    Types: Cigarettes    Quit date: 01/02/2012    Years since quitting: 8.4  . Smokeless tobacco: Never Used  . Tobacco comment: 1 pack per week  Vaping Use  . Vaping Use: Never used  Substance and Sexual Activity  . Alcohol use: No  . Drug use: No  . Sexual activity: Not on file  Other Topics Concern  . Not on file  Social History Narrative   Fun/Hobby: Playing cards and mingling.   Denies abuse and feels safe at home.    Social Determinants of Health   Financial Resource Strain:   . Difficulty of Paying Living Expenses:   Food Insecurity:   . Worried About Charity fundraiser in the Last Year:   . Arboriculturist in the Last Year:   Transportation Needs:   . Film/video editor (Medical):   Marland Kitchen Lack of Transportation (Non-Medical):   Physical Activity:   . Days of Exercise per Week:   . Minutes of Exercise per Session:   Stress:   . Feeling of Stress :   Social Connections:   . Frequency of Communication with Friends and Family:   . Frequency of Social Gatherings with Friends and Family:   .  Attends Religious Services:   . Active Member of Clubs or Organizations:   . Attends Archivist Meetings:   Marland Kitchen Marital Status:     Intimate Partner Violence:   . Fear of Current or Ex-Partner:   . Emotionally Abused:   Marland Kitchen Physically Abused:   . Sexually Abused:      Review of Systems    General:  No chills, fever, night sweats or weight changes.  Cardiovascular:  No chest pain, dyspnea on exertion, edema, orthopnea, palpitations, paroxysmal nocturnal dyspnea. Dermatological: No rash, lesions/masses Respiratory: No cough, dyspnea Urologic: No hematuria, dysuria Abdominal:   No nausea, vomiting, diarrhea, bright red blood per rectum, melena, or hematemesis Neurologic:  No visual changes, wkns, changes in mental status. All other systems reviewed and are otherwise negative except as noted above.  Physical Exam    VS:  BP 124/72   Pulse (!) 58   Ht 5\' 4"  (1.626 m)   Wt 222 lb (100.7 kg)   SpO2 95%   BMI 38.11 kg/m  , BMI Body mass index is 38.11 kg/m. GEN: Well nourished, well developed, in no acute distress. HEENT: normal. Neck: Supple, no JVD, carotid bruits, or masses. Cardiac: RRR, no murmurs, rubs, or gallops. No clubbing, cyanosis, bilateral lower extremity nonpitting edema.  Radials/DP/PT 2+ and equal bilaterally.  Respiratory:  Respirations regular and unlabored, clear to auscultation bilaterally. GI: Soft, nontender, nondistended, BS + x 4. MS: no deformity or atrophy. Skin: warm and dry, no rash. Neuro:  Strength and sensation are intact. Psych: Normal affect.  Accessory Clinical Findings    Recent Labs: 11/07/2019: ALT 13; BUN 15; Creatinine, Ser 0.79; Potassium 4.4; Sodium 142   Recent Lipid Panel    Component Value Date/Time   CHOL 144 11/07/2019 1605   TRIG 107 11/07/2019 1605   HDL 68 11/07/2019 1605   CHOLHDL 2.1 11/07/2019 1605   CHOLHDL 2 04/08/2019 1448   VLDL 18.0 04/08/2019 1448   LDLCALC 57 11/07/2019 1605    ECG personally reviewed by me today-sinus bradycardia nonspecific T wave abnormality 58 bpm- No acute changes  EKG 11/07/2019 Sinus bradycardia 50  bpm  Echocardiogram 03/26/2018  Study Conclusions   - Left ventricle: The cavity size was normal. Wall thickness was  increased in a pattern of mild LVH. Systolic function was normal.  The estimated ejection fraction was in the range of 55% to 60%.  Wall motion was normal; there were no regional wall motion  abnormalities. Doppler parameters are consistent with abnormal  left ventricular relaxation (grade 1 diastolic dysfunction).  - Aortic valve: Mildly calcified annulus. Trileaflet.  - Mitral valve: Mildly thickened leaflets .  - Right ventricle: The cavity size was mildly dilated. Wall  thickness was normal. Systolic function was low normal.  - Right atrium: The atrium was mildly dilated.  - Tricuspid valve: There was mild regurgitation.  - Pulmonary arteries: PA peak pressure: 36 mm Hg (S).   Assessment & Plan   1.  Coronary artery disease-no chest pain today.  No recent episodes of chest pain. Continue aspirin, pravastatin Heart healthy low-sodium diet-salty 6 given Increase physical activity as tolerated  Essential hypertension-BP today 124/72.  Well-controlled at home Continue furosemide Heart healthy low-sodium diet-salty 6 given Increase physical activity as tolerated  Hyperlipidemia-11/07/2019: Cholesterol, Total 144; HDL 68; LDL Chol Calc (NIH) 57; Triglycerides 107 Continue pravastatin Heart healthy low-sodium high-fiber diet Increase physical activity as tolerated  Lower extremity edema-generalized nonpitting lower extremity edema bilateral. Continue furosemide Heart healthy  low-sodium diet-salty 6 given Increase physical activity as tolerated Order BMP  Disposition: Follow-up with Dr. [name] in 6 months.   Jossie Ng. Ameka Krigbaum NP-C    06/21/2020, 9:04 AM [address] Office [phone] Fax [phone]

## 2020-06-21 ENCOUNTER — Ambulatory Visit (INDEPENDENT_AMBULATORY_CARE_PROVIDER_SITE_OTHER): Payer: BC Managed Care – PPO | Admitting: General Practice

## 2020-06-21 ENCOUNTER — Other Ambulatory Visit: Payer: Self-pay

## 2020-06-21 ENCOUNTER — Encounter: Payer: Self-pay | Admitting: General Practice

## 2020-06-21 VITALS — BP 124/72 | HR 58 | Ht 64.0 in | Wt 222.0 lb

## 2020-06-21 DIAGNOSIS — I2510 Atherosclerotic heart disease of native coronary artery without angina pectoris: Secondary | ICD-10-CM | POA: Diagnosis not present

## 2020-06-21 DIAGNOSIS — I10 Essential (primary) hypertension: Secondary | ICD-10-CM

## 2020-06-21 DIAGNOSIS — I872 Venous insufficiency (chronic) (peripheral): Secondary | ICD-10-CM | POA: Diagnosis not present

## 2020-06-21 DIAGNOSIS — E7800 Pure hypercholesterolemia, unspecified: Secondary | ICD-10-CM

## 2020-06-21 NOTE — Patient Instructions (Addendum)
Medication Instructions:  The current medical regimen is effective;  continue present plan and medications as directed. Please refer to the Current Medication list given to you today. *If you need a refill on your cardiac medications before your next appointment, please call your pharmacy*  Special Instructions PLEASE READ AND FOLLOW SALTY 6-ATTACHED  PLEASE INCREASE PHYSICAL ACTIVITY AS TOLERATED  Follow-Up: Your next appointment:  6 month(s) Please call our office 2 months in advance to schedule this appointment In Person with Sanda Klein, MD  At [facility], you and your health needs are our priority.  As part of our continuing mission to provide you with exceptional heart care, we have created designated Provider Care Teams.  These Care Teams include your primary Cardiologist (physician) and Advanced Practice Providers (APPs -  Physician Assistants and Nurse Practitioners) who all work together to provide you with the care you need, when you need it.  We recommend signing up for the patient portal called "MyChart".  Sign up information is provided on this After Visit Summary.  MyChart is used to connect with patients for Virtual Visits (Telemedicine).  Patients are able to view lab/test results, encounter notes, upcoming appointments, etc.  Non-urgent messages can be sent to your provider as well.   To learn more about what you can do with MyChart, go to [email or web address].

## 2020-06-25 ENCOUNTER — Other Ambulatory Visit: Payer: Self-pay

## 2020-06-25 ENCOUNTER — Ambulatory Visit (INDEPENDENT_AMBULATORY_CARE_PROVIDER_SITE_OTHER): Payer: BC Managed Care – PPO | Admitting: Podiatry

## 2020-06-25 DIAGNOSIS — B351 Tinea unguium: Secondary | ICD-10-CM | POA: Diagnosis not present

## 2020-06-25 DIAGNOSIS — Z89422 Acquired absence of other left toe(s): Secondary | ICD-10-CM

## 2020-06-25 DIAGNOSIS — M79674 Pain in right toe(s): Secondary | ICD-10-CM | POA: Diagnosis not present

## 2020-06-25 DIAGNOSIS — M79675 Pain in left toe(s): Secondary | ICD-10-CM | POA: Diagnosis not present

## 2020-06-26 ENCOUNTER — Encounter: Payer: Self-pay | Admitting: Podiatry

## 2020-06-26 NOTE — Progress Notes (Signed)
Subjective:  Patient ID: [name], female    DOB: 01/23/1941,  MRN: [id number]  79 y.o. female presents with at risk foot care. 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    Review of Systems: Negative except as noted in the HPI.  Past Medical History:  Diagnosis Date  . Allergic rhinitis   . Arnold-Chiari malformation ([place])   . Asthma   . CAD (coronary artery disease)   . COPD (chronic obstructive pulmonary disease) ([place])   . Coughing    coughing since last 01-22-2019 , started on cefdinir bid x10 days, has 3 pills left today , reports she feels mcuh better , still coughing copiuus amonts of thick white sputum , denies fever nor chills, nor body aches .   Marland Kitchen DJD (degenerative joint disease)   . GERD (gastroesophageal reflux disease)   . History of absence seizures    last confirmed seizure age 62    . Hx of colonic polyps   . Hypercholesteremia   . Hypertension   . Obesity   . Positive PPD   . Reflex sympathetic dystrophy   . Varicose veins   . Venous insufficiency    Past Surgical History:  Procedure Laterality Date  . ABDOMINAL HYSTERECTOMY    . cspine surgery  1993   for arnold-chiari malformation  . JOINT REPLACEMENT  06/05/11   Left total knee replacement  . right knee arthroscopy  12/2007   Dr. Tonita Cong  . right total knee replacement  05/2008   Dr. Tonita Cong  . TOTAL KNEE REVISION Left 02/03/2019   Procedure: LEFT TOTAL KNEE REVISION;  Surgeon: Rod Can, MD;  Location: [facility];  Service: Orthopedics;  Laterality: Left;   Patient Active Problem List   Diagnosis Date Noted  . Ulcer of toe, left, limited to breakdown of skin ([place]) 08/31/2019  . Diastolic CHF, chronic ([place]) 08/31/2019  . PVD (peripheral vascular disease) ([place]) 08/31/2019  . Bilateral carpal tunnel syndrome 08/19/2019  . Posterior neck pain 08/19/2019  . Vertigo 04/08/2019  . Anemia 04/08/2019  . Stiffness of left knee 02/10/2019  . Failed total knee, left ([place]) 02/03/2019  .  Failed total knee, left, initial encounter ([place]) 02/03/2019  . Carpal tunnel syndrome of left wrist 01/21/2019  . Pain of left hand 01/21/2019  . Preop exam for internal medicine 11/02/2018  . Left hand paresthesia 11/02/2018  . 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 Hyperopia with presbyopia, bilateral 06/29/2018  . Abdominal pain, lower 03/02/2018  . Diarrhea 03/02/2018  . Preventative health care 11/14/2017  . Arnold-Chiari malformation ([place])   . Asthma   . CAD (coronary artery disease)   . COPD (chronic obstructive pulmonary disease) ([place])   . DJD (degenerative joint disease)   . GERD (gastroesophageal reflux disease)   . History of absence seizures   . Hx of colonic polyps   . Hypercholesteremia   . Positive PPD   . Venous insufficiency   . Pain in left lower leg 07/20/2017  . Cellulitis of left lower extremity 06/18/2017  . Bilateral lower extremity edema 04/30/2017  . Shingles 02/26/2017  . COPD exacerbation ([place]) 01/07/2017  . S/P TKR (total knee replacement), bilateral 01/07/2017  . Right arm pain 05/16/2015  . Generalized anxiety disorder 08/31/2014  . Varicose veins of lower extremities with other complications 62/37/6283  . Edema 08/07/2011  . MENORRHAGIA, POSTMENOPAUSAL 08/13/2010  . CIGARETTE SMOKER 06/11/2010  . CARDIAC MURMUR 11/02/2009  . BRADYCARDIA 11/01/2009  .  CHEST PAIN 11/01/2009  . Allergic rhinitis 04/19/2009  . Chronic obstructive airway disease with asthma ([place]) 04/18/2009  . LEG CRAMPS, NOCTURNAL 11/02/2008  . Essential hypertension 01/17/2008  . COLONIC POLYPS 01/14/2008  . Obesity 01/14/2008  . Reflex sympathetic dystrophy 01/14/2008  . Coronary atherosclerosis 01/14/2008  . GERD 01/14/2008  . Osteoarthritis 01/14/2008  . BACK PAIN, LUMBAR 01/14/2008  . SEIZURES, HX OF 01/14/2008    Current Outpatient Medications:  .  albuterol (PROVENTIL HFA;VENTOLIN HFA) 108 (90 BASE) MCG/ACT inhaler, Inhale 2 puffs into  the lungs every 4 (four) hours as needed. For wheeze or shortness of breath (Patient taking differently: Inhale 2 puffs into the lungs every 4 (four) hours as needed for wheezing or shortness of breath. ), Disp: 1 Inhaler, Rfl: 6 .  alendronate (FOSAMAX) 70 MG tablet, TAKE 1 TABLET (70 MG TOTAL) BY MOUTH EVERY 7 (SEVEN) DAYS. TAKE WITH A FULL GLASS OF WATER ON AN EMPTY STOMACH., Disp: 12 tablet, Rfl: 1 .  apixaban (ELIQUIS) 2.5 MG TABS tablet, Take 1 tablet (2.5 mg total) by mouth every 12 (twelve) hours., Disp: 60 tablet, Rfl: 0 .  aspirin 81 MG tablet, Take 81 mg by mouth daily.  , Disp: , Rfl:  .  azelastine (ASTELIN) 0.1 % nasal spray, Place 1 spray into both nostrils daily as needed for rhinitis or allergies. , Disp: , Rfl:  .  budesonide-formoterol (SYMBICORT) 160-4.5 MCG/ACT inhaler, Inhale 2 puffs into the lungs 2 (two) times daily., Disp: 1 Inhaler, Rfl: 0 .  docusate sodium (COLACE) 100 MG capsule, Take 1 capsule (100 mg total) by mouth 2 (two) times daily., Disp: 60 capsule, Rfl: 1 .  EPINEPHrine 0.3 mg/0.3 mL IJ SOAJ injection, epinephrine 0.3 mg/0.3 mL injection, auto-injector, Disp: , Rfl:  .  ferrous sulfate 325 (65 FE) MG EC tablet, Take 325 mg by mouth daily., Disp: , Rfl:  .  fluticasone (FLONASE) 50 MCG/ACT nasal spray, Place 1 spray into both nostrils 2 (two) times daily as needed for allergies or rhinitis., Disp: , Rfl:  .  furosemide (LASIX) 40 MG tablet, Take 2 tablets (80 mg total) by mouth daily. OFFICE VISIT IS DUE FOR REFILLS, Disp: 180 tablet, Rfl: 0 .  KLOR-CON M20 20 MEQ tablet, TAKE 1 TABLET BY MOUTH EVERY DAY, Disp: 90 tablet, Rfl: 0 .  meloxicam (MOBIC) 15 MG tablet, TAKE 1 TABLET BY MOUTH EVERY DAY AS NEEDED FOR PAIN, Disp: 30 tablet, Rfl: 5 .  Menthol (ICY HOT BACK EXTRA STRENGTH) 5 % PTCH, Apply 1 patch topically daily as needed (pain)., Disp: , Rfl:  .  montelukast (SINGULAIR) 10 MG tablet, Take 10 mg by mouth every evening., Disp: , Rfl:  .  mupirocin ointment  (BACTROBAN) 2 %, USE IN BOTH NOSTRILS TWICE A DAY FOR 5 DAYS, Disp: , Rfl:  .  omeprazole (PRILOSEC) 20 MG capsule, Take 20 mg by mouth daily., Disp: , Rfl:  .  omeprazole (PRILOSEC) 40 MG capsule, TAKE 1 CAPSULE BY MOUTH EVERY DAY (Patient taking differently: Take 40 mg by mouth daily. ), Disp: 90 capsule, Rfl: 1 .  ondansetron (ZOFRAN) 4 MG tablet, Take 1 tablet (4 mg total) by mouth every 6 (six) hours as needed for nausea., Disp: 20 tablet, Rfl: 0 .  pravastatin (PRAVACHOL) 40 MG tablet, TAKE 1 TABLET BY MOUTH EVERY DAY, Disp: 90 tablet, Rfl: 1 .  senna (SENOKOT) 8.6 MG TABS tablet, Take 2 tablets (17.2 mg total) by mouth at bedtime., Disp: 60 tablet, Rfl: 1 .  Tiotropium Bromide Monohydrate (SPIRIVA RESPIMAT) 1.25 MCG/ACT AERS, Spiriva Respimat 1.25 mcg/actuation solution for inhalation  INHALE 2 PUFFS ONCE A DAY, Disp: , Rfl:  .  tiZANidine (ZANAFLEX) 2 MG tablet, Take 1 tablet (2 mg total) by mouth every 6 (six) hours as needed for muscle spasms., Disp: 40 tablet, Rfl: 1 .  traMADol (ULTRAM) 50 MG tablet, Take 1 tablet (50 mg total) by mouth every 6 (six) hours as needed., Disp: 30 tablet, Rfl: 0 .  traZODone (DESYREL) 100 MG tablet, TAKE 1 TABLET BY MOUTH EVERYDAY AT BEDTIME, Disp: 90 tablet, Rfl: 1 No Known Allergies Social History   Occupational History  . Occupation: retired  Tobacco Use  . Smoking status: Former Smoker    Packs/day: 0.30    Years: 12.00    Pack years: 3.60    Types: Cigarettes    Quit date: 01/02/2012    Years since quitting: 8.4  . Smokeless tobacco: Never Used  . Tobacco comment: 1 pack per week  Vaping Use  . Vaping Use: Never used  Substance and Sexual Activity  . Alcohol use: No  . Drug use: No  . Sexual activity: Not on file    Objective:   Constitutional Pt is a pleasant 79 y.o. African American female in NAD.Marland Kitchen AAO x 3.   Vascular Capillary refill time to digits immediate b/l. Palpable pedal pulses b/l LE. Pedal hair sparse. Lower extremity skin  temperature gradient within normal limits. No pain with calf compression b/l. No edema noted b/l lower extremities. No cyanosis or clubbing noted.  Neurologic Normal speech. Oriented to person, place, and time. Protective sensation intact 5/5 intact bilaterally with 10g monofilament b/l. Vibratory sensation intact b/l. Proprioception intact bilaterally.  Dermatologic Pedal skin with normal turgor, texture and tone bilaterally. No open wounds bilaterally. No interdigital macerations bilaterally. Toenails L hallux, L 4th toe, L 5th toe, R hallux, R 3rd toe, R 4th toe and R 5th toe elongated, discolored, dystrophic, thickened, and crumbly with subungual debris and tenderness to dorsal palpation. Anonychia noted L 3rd toe and R 2nd toe. Nailbed(s) epithelialized.   Orthopedic: Normal muscle strength 5/5 to all lower extremity muscle groups bilaterally. No pain crepitus or joint limitation noted with ROM b/l. Lower extremity amputation(s): digital amputation L 2nd toe.   Radiographs: None Assessment:   1. Pain due to onychomycosis of toenails of both feet   2. Status post amputation of lesser toe, left ([place])    Plan:  Patient was evaluated and treated and all questions answered.  Onychomycosis with pain -Nails palliatively debridement as below. -Educated on self-care  Procedure: Nail Debridement Rationale: Pain Type of Debridement: manual, sharp debridement. Instrumentation: Nail nipper, rotary burr. Number of Nails: 10  -Examined patient. -No new findings. No new orders. -Toenails L hallux, L 4th toe, L 5th toe, R hallux, R 3rd toe, R 4th toe and R 5th toe debrided in length and girth without iatrogenic bleeding with sterile nail nipper and dremel.  -Patient to report any pedal injuries to medical professional immediately. -Patient to continue soft, supportive shoe gear daily. -Patient/POA to call should there be question/concern in the interim.  Return in about 3 months (around  09/25/2020) for nail trim.  Marzetta Board, DPM

## 2020-06-29 LAB — HM MAMMOGRAPHY

## 2020-07-03 ENCOUNTER — Encounter: Payer: Self-pay | Admitting: Internal Medicine

## 2020-07-06 ENCOUNTER — Telehealth: Payer: Self-pay | Admitting: Internal Medicine

## 2020-07-06 NOTE — Telephone Encounter (Signed)
Patient is requesting a "travel hardship letter" to be written. Patients grandson whom she raised is currently in prison.  Patient states she is unable to travel long distance to see him.  The court/prison would consider a letter from provider stating patient unable to travel distance, with the hopes of prisoner being moved closer to patient.  Please advise

## 2020-07-09 NOTE — Telephone Encounter (Signed)
Very sorry, I do not feel comfortable with letters like this getting involved with the legal system

## 2020-07-09 NOTE — Telephone Encounter (Signed)
Sent to Dr. John to advise. 

## 2020-07-10 NOTE — Telephone Encounter (Signed)
New message:   Pt called to check on the status of her letter and has been notified of Dr. Gwynn Burly decision.

## 2020-08-09 ENCOUNTER — Other Ambulatory Visit: Payer: Self-pay

## 2020-08-09 ENCOUNTER — Ambulatory Visit (INDEPENDENT_AMBULATORY_CARE_PROVIDER_SITE_OTHER): Payer: BC Managed Care – PPO | Admitting: Podiatry

## 2020-08-09 ENCOUNTER — Encounter: Payer: Self-pay | Admitting: Podiatry

## 2020-08-09 ENCOUNTER — Ambulatory Visit (INDEPENDENT_AMBULATORY_CARE_PROVIDER_SITE_OTHER): Payer: BC Managed Care – PPO

## 2020-08-09 DIAGNOSIS — M21619 Bunion of unspecified foot: Secondary | ICD-10-CM

## 2020-08-09 DIAGNOSIS — I2510 Atherosclerotic heart disease of native coronary artery without angina pectoris: Secondary | ICD-10-CM

## 2020-08-09 DIAGNOSIS — L89620 Pressure ulcer of left heel, unstageable: Secondary | ICD-10-CM

## 2020-08-09 DIAGNOSIS — M79672 Pain in left foot: Secondary | ICD-10-CM | POA: Diagnosis not present

## 2020-08-09 DIAGNOSIS — M722 Plantar fascial fibromatosis: Secondary | ICD-10-CM

## 2020-08-09 NOTE — Progress Notes (Signed)
Subjective:   Patient ID: [name], female   DOB: 79 y.o.   MRN: [id number]   HPI Patient presents with 2 separate problems with 1 being exquisite discomfort left plantar fascia and the second being structural bunion deformity left which gets tender at time and has history of amputation second digit left   ROS      Objective:  Physical Exam  Neurovascular status intact with patient noted to have exquisite discomfort left plantar fascia at the insertion of the tendon into the calcaneus with fluid buildup and is noted to have significant structural bunion deformity left with loss of second toe left     Assessment:  Acute plantar fasciitis left with structural bunion with loss of integrity of the second digit left     Plan:  H&P reviewed condition and at this point organ to focus on the heel even though I did discuss bunion correction but do not recommend at this point unless symptoms were to get worse.  I did do sterile prep injected the left plantar fascia 3 mg Kenalog 5 mg Xylocaine applied fascial brace gave instructions on physical therapy support therapy and patient will be seen back and may require other treatment depending on response  X-ray indicates large plantar spur formation and does also indicate that there is significant bunion deformity left with loss of second toe integrity

## 2020-08-15 DIAGNOSIS — M4310 Spondylolisthesis, site unspecified: Secondary | ICD-10-CM | POA: Insufficient documentation

## 2020-09-05 ENCOUNTER — Encounter: Payer: Self-pay | Admitting: Podiatry

## 2020-09-05 ENCOUNTER — Ambulatory Visit (INDEPENDENT_AMBULATORY_CARE_PROVIDER_SITE_OTHER): Payer: BC Managed Care – PPO | Admitting: Podiatry

## 2020-09-05 ENCOUNTER — Other Ambulatory Visit: Payer: Self-pay

## 2020-09-05 DIAGNOSIS — M79675 Pain in left toe(s): Secondary | ICD-10-CM | POA: Diagnosis not present

## 2020-09-05 DIAGNOSIS — M722 Plantar fascial fibromatosis: Secondary | ICD-10-CM

## 2020-09-05 DIAGNOSIS — M79674 Pain in right toe(s): Secondary | ICD-10-CM | POA: Diagnosis not present

## 2020-09-05 DIAGNOSIS — B351 Tinea unguium: Secondary | ICD-10-CM

## 2020-09-05 NOTE — Patient Instructions (Signed)

## 2020-09-05 NOTE — Progress Notes (Signed)
Subjective:   Patient ID: [name], female   DOB: 79 y.o.   MRN: [id number]   HPI Patient states her left heel has improved but it still sore when she gets up and her nails are thickened incurvated and she would just like to go ahead and get them trimmed   ROS      Objective:  Physical Exam  Neurovascular status intact with thick yellow brittle nailbeds 1-5 both feet that are dystrophic and noted to have inflammation pain still of the plantar fascial left     Assessment:  Mycotic nail infections 1-5 both feet that do become painful along with continued plantar fasciitis left     Plan:  Education on both conditions and I debrided nailbeds 1-5 both feet with no iatrogenic bleeding and for the left I went ahead did sterile prep and injected the fascia 3 mg Kenalog 5 mg Xylocaine

## 2020-09-25 ENCOUNTER — Ambulatory Visit: Payer: Medicare Other | Admitting: Podiatry

## 2020-09-26 ENCOUNTER — Other Ambulatory Visit: Payer: Self-pay | Admitting: Internal Medicine

## 2020-09-27 DIAGNOSIS — M5136 Other intervertebral disc degeneration, lumbar region: Secondary | ICD-10-CM | POA: Insufficient documentation

## 2020-10-02 ENCOUNTER — Ambulatory Visit (INDEPENDENT_AMBULATORY_CARE_PROVIDER_SITE_OTHER): Payer: BC Managed Care – PPO | Admitting: Podiatry

## 2020-10-02 ENCOUNTER — Encounter: Payer: Self-pay | Admitting: Podiatry

## 2020-10-02 ENCOUNTER — Other Ambulatory Visit: Payer: Self-pay

## 2020-10-02 DIAGNOSIS — Z89422 Acquired absence of other left toe(s): Secondary | ICD-10-CM

## 2020-10-02 DIAGNOSIS — M79672 Pain in left foot: Secondary | ICD-10-CM

## 2020-10-02 DIAGNOSIS — L89620 Pressure ulcer of left heel, unstageable: Secondary | ICD-10-CM | POA: Diagnosis not present

## 2020-10-02 NOTE — Patient Instructions (Signed)
Pressure Injury  A pressure injury is damage to the skin and underlying tissue that results from pressure being applied to an area of the body. It often affects people who must spend a long time in a bed or chair because of a medical condition. Pressure injuries usually occur:  Over bony parts of the body, such as the tailbone, shoulders, elbows, hips, heels, spine, ankles, and back of the head.  Under medical devices that make contact with the body, such as respiratory equipment, stockings, tubes, and splints. Pressure injuries start as reddened areas on the skin and can lead to pain and an open wound. What are the causes? This condition is caused by frequent or constant pressure to an area of the body. Decreased blood flow to the skin can eventually cause the skin tissue to die and break down, causing a wound. What increases the risk? You are more likely to develop this condition if you:  Are in the hospital or an extended care facility.  Are bedridden or in a wheelchair.  Have an injury or disease that keeps you from: ? Moving normally. ? Feeling pain or pressure.  Have a condition that: ? Makes you sleepy or less alert. ? Causes poor blood flow.  Need to wear a medical device.  Have poor control of your bladder or bowel functions (incontinence).  Have poor nutrition (malnutrition). If you are at risk for pressure injuries, your health care provider may recommend certain types of mattresses, mattress covers, pillows, cushions, or boots to help prevent them. These may include products filled with air, foam, gel, or sand. What are the signs or symptoms? Symptoms of this condition depend on the severity of the injury. Symptoms may include:  Red or dark areas of the skin.  Pain, warmth, or a change of skin texture.  Blisters.  An open wound. How is this diagnosed? This condition is diagnosed with a medical history and physical exam. You may also have tests, such as:  Blood  tests.  Imaging tests.  Blood flow tests. Your pressure injury will be staged based on its severity. Staging is based on:  The depth of the tissue injury, including whether there is exposure of muscle, bone, or tendon.  The cause of the pressure injury. How is this treated? This condition may be treated by:  Relieving or redistributing pressure on your skin. This includes: ? Frequently changing your position. ? Avoiding positions that caused the wound or that can make the wound worse. ? Using specific bed mattresses, chair cushions, or protective boots. ? Moving medical devices from an area of pressure, or placing padding between the skin and the device. ? Using foams, creams, or powders to prevent rubbing (friction) on the skin.  Keeping your skin clean and dry. This may include using a skin cleanser or skin barrier as told by your health care provider.  Cleaning your injury and removing any dead tissue from the wound (debridement).  Placing a bandage (dressing) over your injury.  Using medicines for pain or to prevent or treat infection. Surgery may be needed if other treatments are not working or if your injury is very deep. Follow these instructions at home: Wound care  Follow instructions from your health care provider about how to take care of your wound. Make sure you: ? Wash your hands with soap and water before and after you change your bandage (dressing). If soap and water are not available, use hand sanitizer. ? Change your dressing as told   by your health care provider.  Check your wound every day for signs of infection. Have a caregiver do this for you if you are not able. Check for: ? Redness, swelling, or increased pain. ? More fluid or blood. ? Warmth. ? Pus or a bad smell. Skin care  Keep your skin clean and dry. Gently pat your skin dry.  Do not rub or massage your skin.  You or a caregiver should check your skin every day for any changes in color or  any new blisters or sores (ulcers). Medicines  Take over-the-counter and prescription medicines only as told by your health care provider.  If you were prescribed an antibiotic medicine, take or apply it as told by your health care provider. Do not stop using the antibiotic even if your condition improves. Reducing and redistributing pressure  Do not lie or sit in one position for a long time. Move or change position every 1-2 hours, or as told by your health care provider.  Use pillows or cushions to reduce pressure. Ask your health care provider to recommend cushions or pads for you. General instructions   Eat a healthy diet that includes lots of protein.  Drink enough fluid to keep your urine pale yellow.  Be as active as you can every day. Ask your health care provider to suggest safe exercises or activities.  Do not abuse drugs or alcohol.  Do not use any products that contain nicotine or tobacco, such as cigarettes, e-cigarettes, and chewing tobacco. If you need help quitting, ask your health care provider.  Keep all follow-up visits as told by your health care provider. This is important. Contact a health care provider if:  You have: ? A fever or chills. ? Pain that is not helped by medicine. ? Any changes in skin color. ? New blisters or sores. ? Pus or a bad smell coming from your wound. ? Redness, swelling, or pain around your wound. ? More fluid or blood coming from your wound.  Your wound does not improve after 1-2 weeks of treatment. Summary  A pressure injury is damage to the skin and underlying tissue that results from pressure being applied to an area of the body.  Do not lie or sit in one position for a long time. Your health care provider may advise you to move or change position every 1-2 hours.  Follow instructions from your health care provider about how to take care of your wound.  Keep all follow-up visits as told by your health care provider. This  is important. This information is not intended to replace advice given to you by your health care provider. Make sure you discuss any questions you have with your health care provider. Document Revised: 06/16/2018 Document Reviewed: 06/16/2018 [employer] Patient Education  2020 [employer]  

## 2020-10-06 NOTE — Progress Notes (Signed)
Subjective:  Patient ID: [name], female    DOB: Jan 08, 1941,  MRN: [id number]  79 y.o. female presents with with chief concern of painful left heel.    She states she was seen last month and received an injection, but still has some pain. Today, she states pain is located around perimeter of heel and posterior heel. She sleeps on her back most of the time.  PCP: Biagio Borg, MD and last visit was: 03/01/2020.  Review of Systems: Negative except as noted in the HPI.  Past Medical History:  Diagnosis Date  . Allergic rhinitis   . Arnold-Chiari malformation ([place])   . Asthma   . CAD (coronary artery disease)   . COPD (chronic obstructive pulmonary disease) ([place])   . Coughing    coughing since last 01-22-2019 , started on cefdinir bid x10 days, has 3 pills left today , reports she feels mcuh better , still coughing copiuus amonts of thick white sputum , denies fever nor chills, nor body aches .   Marland Kitchen DJD (degenerative joint disease)   . GERD (gastroesophageal reflux disease)   . History of absence seizures    last confirmed seizure age 70    . Hx of colonic polyps   . Hypercholesteremia   . Hypertension   . Obesity   . Positive PPD   . Reflex sympathetic dystrophy   . Varicose veins   . Venous insufficiency    Past Surgical History:  Procedure Laterality Date  . ABDOMINAL HYSTERECTOMY    . cspine surgery  1993   for arnold-chiari malformation  . JOINT REPLACEMENT  06/05/11   Left total knee replacement  . right knee arthroscopy  12/2007   Dr. Tonita Cong  . right total knee replacement  05/2008   Dr. Tonita Cong  . TOTAL KNEE REVISION Left 02/03/2019   Procedure: LEFT TOTAL KNEE REVISION;  Surgeon: Rod Can, MD;  Location: [facility];  Service: Orthopedics;  Laterality: Left;   Patient Active Problem List   Diagnosis Date Noted  . Degenerative spondylolisthesis 08/15/2020  . Pes anserinus bursitis of right knee 05/07/2020  . Ulcer of toe, left, limited to breakdown of skin ([place])  08/31/2019  . Diastolic CHF, chronic ([place]) 08/31/2019  . PVD (peripheral vascular disease) ([place]) 08/31/2019  . Bilateral carpal tunnel syndrome 08/19/2019  . Posterior neck pain 08/19/2019  . Vertigo 04/08/2019  . Anemia 04/08/2019  . Stiffness of left knee 02/10/2019  . Failed total knee, left ([place]) 02/03/2019  . Failed total knee, left, initial encounter ([place]) 02/03/2019  . Carpal tunnel syndrome of left wrist 01/21/2019  . Pain of left hand 01/21/2019  . Preop exam for internal medicine 11/02/2018  . Left hand paresthesia 11/02/2018  . 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 Hyperopia with presbyopia, bilateral 06/29/2018  . Abdominal pain, lower 03/02/2018  . Diarrhea 03/02/2018  . Preventative health care 11/14/2017  . Arnold-Chiari malformation ([place])   . Asthma   . CAD (coronary artery disease)   . COPD (chronic obstructive pulmonary disease) ([place])   . DJD (degenerative joint disease)   . GERD (gastroesophageal reflux disease)   . History of absence seizures   . Hx of colonic polyps   . Hypercholesteremia   . Positive PPD   . Venous insufficiency   . Pain in left lower leg 07/20/2017  . Cellulitis of left lower extremity 06/18/2017  . Bilateral lower extremity edema 04/30/2017  . Shingles 02/26/2017  . COPD exacerbation (  [place]) 01/07/2017  . S/P TKR (total knee replacement), bilateral 01/07/2017  . Right arm pain 05/16/2015  . Generalized anxiety disorder 08/31/2014  . Varicose veins of lower extremities with other complications 66/59/9357  . Edema 08/07/2011  . MENORRHAGIA, POSTMENOPAUSAL 08/13/2010  . CIGARETTE SMOKER 06/11/2010  . CARDIAC MURMUR 11/02/2009  . BRADYCARDIA 11/01/2009  . CHEST PAIN 11/01/2009  . Allergic rhinitis 04/19/2009  . Chronic obstructive airway disease with asthma ([place]) 04/18/2009  . LEG CRAMPS, NOCTURNAL 11/02/2008  . Essential hypertension 01/17/2008  . COLONIC POLYPS 01/14/2008  . Obesity 01/14/2008  .  Reflex sympathetic dystrophy 01/14/2008  . Coronary atherosclerosis 01/14/2008  . GERD 01/14/2008  . Osteoarthritis 01/14/2008  . BACK PAIN, LUMBAR 01/14/2008  . SEIZURES, HX OF 01/14/2008    Current Outpatient Medications:  .  albuterol (PROVENTIL HFA;VENTOLIN HFA) 108 (90 BASE) MCG/ACT inhaler, Inhale 2 puffs into the lungs every 4 (four) hours as needed. For wheeze or shortness of breath (Patient taking differently: Inhale 2 puffs into the lungs every 4 (four) hours as needed for wheezing or shortness of breath. ), Disp: 1 Inhaler, Rfl: 6 .  alendronate (FOSAMAX) 70 MG tablet, TAKE 1 TABLET (70 MG TOTAL) BY MOUTH EVERY 7 (SEVEN) DAYS. TAKE WITH A FULL GLASS OF WATER ON AN EMPTY STOMACH., Disp: 12 tablet, Rfl: 1 .  apixaban (ELIQUIS) 2.5 MG TABS tablet, Take 1 tablet (2.5 mg total) by mouth every 12 (twelve) hours., Disp: 60 tablet, Rfl: 0 .  aspirin 81 MG tablet, Take 81 mg by mouth daily.  , Disp: , Rfl:  .  azelastine (ASTELIN) 0.1 % nasal spray, Place 1 spray into both nostrils daily as needed for rhinitis or allergies. , Disp: , Rfl:  .  docusate sodium (COLACE) 100 MG capsule, Take 1 capsule (100 mg total) by mouth 2 (two) times daily., Disp: 60 capsule, Rfl: 1 .  EPINEPHrine 0.3 mg/0.3 mL IJ SOAJ injection, epinephrine 0.3 mg/0.3 mL injection, auto-injector, Disp: , Rfl:  .  ferrous sulfate 325 (65 FE) MG EC tablet, Take 325 mg by mouth daily., Disp: , Rfl:  .  fluticasone (FLONASE) 50 MCG/ACT nasal spray, Place 1 spray into both nostrils 2 (two) times daily as needed for allergies or rhinitis., Disp: , Rfl:  .  furosemide (LASIX) 40 MG tablet, Take 2 tablets (80 mg total) by mouth daily. OFFICE VISIT IS DUE FOR REFILLS, Disp: 180 tablet, Rfl: 0 .  HYDROcodone-acetaminophen (NORCO/VICODIN) 5-325 MG tablet, hydrocodone 5 mg-acetaminophen 325 mg tablet  TAKE 1 TABLET TWICE A DAY BY ORAL ROUTE AS NEEDED FOR PAIN FOR 5 DAYS., Disp: , Rfl:  .  KLOR-CON M20 20 MEQ tablet, TAKE 1 TABLET BY  MOUTH EVERY DAY, Disp: 90 tablet, Rfl: 0 .  meloxicam (MOBIC) 15 MG tablet, TAKE 1 TABLET BY MOUTH EVERY DAY AS NEEDED FOR PAIN, Disp: 30 tablet, Rfl: 5 .  Menthol (ICY HOT BACK EXTRA STRENGTH) 5 % PTCH, Apply 1 patch topically daily as needed (pain)., Disp: , Rfl:  .  montelukast (SINGULAIR) 10 MG tablet, Take 10 mg by mouth every evening., Disp: , Rfl:  .  mupirocin ointment (BACTROBAN) 2 %, USE IN BOTH NOSTRILS TWICE A DAY FOR 5 DAYS, Disp: , Rfl:  .  omeprazole (PRILOSEC) 20 MG capsule, Take 20 mg by mouth daily., Disp: , Rfl:  .  omeprazole (PRILOSEC) 40 MG capsule, TAKE 1 CAPSULE BY MOUTH EVERY DAY (Patient taking differently: Take 40 mg by mouth daily. ), Disp: 90 capsule, Rfl: 1 .  ondansetron (ZOFRAN) 4 MG tablet, Take 1 tablet (4 mg total) by mouth every 6 (six) hours as needed for nausea., Disp: 20 tablet, Rfl: 0 .  pravastatin (PRAVACHOL) 40 MG tablet, TAKE 1 TABLET BY MOUTH EVERY DAY, Disp: 90 tablet, Rfl: 1 .  predniSONE (STERAPRED UNI-PAK 21 TAB) 5 MG (21) TBPK tablet, Take by mouth., Disp: , Rfl:  .  senna (SENOKOT) 8.6 MG TABS tablet, Take 2 tablets (17.2 mg total) by mouth at bedtime., Disp: 60 tablet, Rfl: 1 .  Tiotropium Bromide Monohydrate (SPIRIVA RESPIMAT) 1.25 MCG/ACT AERS, Spiriva Respimat 1.25 mcg/actuation solution for inhalation  INHALE 2 PUFFS ONCE A DAY, Disp: , Rfl:  .  tiZANidine (ZANAFLEX) 2 MG tablet, Take 1 tablet (2 mg total) by mouth every 6 (six) hours as needed for muscle spasms., Disp: 40 tablet, Rfl: 1 .  traMADol (ULTRAM) 50 MG tablet, Take 1 tablet (50 mg total) by mouth every 6 (six) hours as needed., Disp: 30 tablet, Rfl: 0 .  traZODone (DESYREL) 100 MG tablet, TAKE 1 TABLET BY MOUTH EVERYDAY AT BEDTIME, Disp: 90 tablet, Rfl: 1 .  budesonide-formoterol (SYMBICORT) 160-4.5 MCG/ACT inhaler, Inhale 2 puffs into the lungs 2 (two) times daily., Disp: 1 Inhaler, Rfl: 0 No Known Allergies Social History   Tobacco Use  Smoking Status Former Smoker  .  Packs/day: 0.30  . Years: 12.00  . Pack years: 3.60  . Types: Cigarettes  . Quit date: 01/02/2012  . Years since quitting: 8.7  Smokeless Tobacco Never Used  Tobacco Comment   1 pack per week    Objective:  There were no vitals filed for this visit. Constitutional Patient is a pleasant 79 y.o. African American female obese in NAD. AAO x 3.  Vascular Capillary refill time to digits immediate b/l. Palpable pedal pulses b/l LE. Pedal hair sparse. Lower extremity skin temperature gradient within normal limits. No cyanosis or clubbing noted.  Neurologic Normal speech. Protective sensation intact 5/5 intact bilaterally with 10g monofilament b/l. Vibratory sensation intact b/l.  Dermatologic Pedal skin with normal turgor, texture and tone bilaterally. No open wounds bilaterally. No interdigital macerations bilaterally. Pain on palpation posterolateral heel left foot. No blistering, no fluctuance. Toenails recently debrided b/l.  Orthopedic: Normal muscle strength 5/5 to all lower extremity muscle groups bilaterally. Lower extremity amputation(s): digital amputation L 2nd toe.    Assessment:   1. Pressure injury of left heel, unstageable ([place])   2. Left foot pain   3. Status post amputation of lesser toe of left foot ([place])    Plan:  Patient was evaluated and treated and all questions answered.  -Examined patient. -Patient to continue soft, supportive shoe gear daily. -Patient to report any pedal injuries to medical professional immediately. -Discussed pressure precautions and decubitus ulcerations. Dispensed heel protector for left foot for her to wear in bed. She will float the right heel for now. -Patient/POA to call should there be question/concern in the interim.  Return in about 3 months (around 01/02/2021).  Marzetta Board, DPM

## 2020-10-16 ENCOUNTER — Ambulatory Visit (HOSPITAL_COMMUNITY)
Admission: RE | Admit: 2020-10-16 | Discharge: 2020-10-16 | Disposition: A | Discharge status: Home / Self Care | Payer: BC Managed Care – PPO | Source: Ambulatory Visit | Attending: Cardiovascular Disease | Admitting: Cardiovascular Disease

## 2020-10-16 ENCOUNTER — Other Ambulatory Visit (HOSPITAL_COMMUNITY): Payer: Self-pay | Admitting: Specialist

## 2020-10-16 ENCOUNTER — Other Ambulatory Visit: Payer: Self-pay

## 2020-10-16 DIAGNOSIS — M79662 Pain in left lower leg: Secondary | ICD-10-CM

## 2020-10-16 DIAGNOSIS — M79605 Pain in left leg: Secondary | ICD-10-CM | POA: Diagnosis not present

## 2020-11-06 ENCOUNTER — Other Ambulatory Visit: Payer: Self-pay

## 2020-11-06 ENCOUNTER — Ambulatory Visit: Payer: BC Managed Care – PPO | Admitting: Internal Medicine

## 2020-11-06 ENCOUNTER — Encounter (INDEPENDENT_AMBULATORY_CARE_PROVIDER_SITE_OTHER): Payer: Self-pay

## 2020-11-06 ENCOUNTER — Encounter: Payer: Self-pay | Admitting: Internal Medicine

## 2020-11-06 VITALS — BP 122/82 | HR 79 | Temp 98.4°F | Ht 64.0 in | Wt 207.0 lb

## 2020-11-06 DIAGNOSIS — E7800 Pure hypercholesterolemia, unspecified: Secondary | ICD-10-CM

## 2020-11-06 DIAGNOSIS — R252 Cramp and spasm: Secondary | ICD-10-CM | POA: Diagnosis not present

## 2020-11-06 DIAGNOSIS — Z0001 Encounter for general adult medical examination with abnormal findings: Secondary | ICD-10-CM

## 2020-11-06 DIAGNOSIS — Z0000 Encounter for general adult medical examination without abnormal findings: Secondary | ICD-10-CM | POA: Diagnosis not present

## 2020-11-06 DIAGNOSIS — E559 Vitamin D deficiency, unspecified: Secondary | ICD-10-CM

## 2020-11-06 DIAGNOSIS — M159 Polyosteoarthritis, unspecified: Secondary | ICD-10-CM

## 2020-11-06 DIAGNOSIS — I10 Essential (primary) hypertension: Secondary | ICD-10-CM

## 2020-11-06 DIAGNOSIS — J449 Chronic obstructive pulmonary disease, unspecified: Secondary | ICD-10-CM

## 2020-11-06 DIAGNOSIS — K219 Gastro-esophageal reflux disease without esophagitis: Secondary | ICD-10-CM

## 2020-11-06 DIAGNOSIS — R739 Hyperglycemia, unspecified: Secondary | ICD-10-CM

## 2020-11-06 DIAGNOSIS — E538 Deficiency of other specified B group vitamins: Secondary | ICD-10-CM | POA: Diagnosis not present

## 2020-11-06 DIAGNOSIS — M8949 Other hypertrophic osteoarthropathy, multiple sites: Secondary | ICD-10-CM

## 2020-11-06 MED ORDER — TIZANIDINE HCL 2 MG PO TABS: 2.0000 mg | ORAL_TABLET | Freq: Four times a day (QID) | ORAL | 1 refills | Status: DC | PRN

## 2020-11-06 NOTE — Patient Instructions (Signed)
Please take all new medication as prescribed - the muscle relaxer as needed  Please continue all other medications as before, and refills have been done if requested.  Please have the pharmacy call with any other refills you may need.  Please continue your efforts at being more active, low cholesterol diet, and weight control.  You are otherwise up to date with prevention measures today.  Please keep your appointments with your specialists as you may have planned  Please go to the LAB at the blood drawing area for the tests to be done  You will be contacted by phone if any changes need to be made immediately.  Otherwise, you will receive a letter about your results with an explanation, but please check with MyChart first.  Please remember to sign up for MyChart if you have not done so, as this will be important to you in the future with finding out test results, communicating by private email, and scheduling acute appointments online when needed.  Please make an Appointment to return in 6 months, or sooner if needed

## 2020-11-06 NOTE — Progress Notes (Signed)
Subjective:    Patient ID: [name], female    DOB: 04-29-1941, 79 y.o.   MRN: [id number]  HPI  Here for wellness and f/u;  Overall doing ok;  Pt denies Chest pain, worsening SOB, DOE, wheezing, orthopnea, PND, worsening LE edema, palpitations, dizziness or syncope.  Pt denies neurological change such as new headache, facial or extremity weakness.  Pt denies polydipsia, polyuria, or low sugar symptoms. Pt states overall good compliance with treatment and medications, good tolerability, and has been trying to follow appropriate diet.  Pt denies worsening depressive symptoms, suicidal ideation or panic. No fever, night sweats, wt loss, loss of appetite, or other constitutional symptoms.  Pt states good ability with ADL's, has low fall risk, home safety reviewed and adequate, no other significant changes in hearing or vision, and only occasionally active with exercise. Also c/o bilat leg cramps right more than left for unclear reasons, though does stand quite bit lately more than usual.    Denies worsening reflux, abd pain, dysphagia, n/v, bowel change or blood. Past Medical History:  Diagnosis Date  . Allergic rhinitis   . Arnold-Chiari malformation ([place])   . Asthma   . CAD (coronary artery disease)   . COPD (chronic obstructive pulmonary disease) ([place])   . Coughing    coughing since last 01-22-2019 , started on cefdinir bid x10 days, has 3 pills left today , reports she feels mcuh better , still coughing copiuus amonts of thick white sputum , denies fever nor chills, nor body aches .   Marland Kitchen DJD (degenerative joint disease)   . GERD (gastroesophageal reflux disease)   . History of absence seizures    last confirmed seizure age 71    . Hx of colonic polyps   . Hypercholesteremia   . Hypertension   . Obesity   . Positive PPD   . Reflex sympathetic dystrophy   . Varicose veins   . Venous insufficiency    Past Surgical History:  Procedure Laterality Date  . ABDOMINAL HYSTERECTOMY    .  cspine surgery  1993   for arnold-chiari malformation  . JOINT REPLACEMENT  06/05/11   Left total knee replacement  . right knee arthroscopy  12/2007   Dr. Tonita Cong  . right total knee replacement  05/2008   Dr. Tonita Cong  . TOTAL KNEE REVISION Left 02/03/2019   Procedure: LEFT TOTAL KNEE REVISION;  Surgeon: Rod Can, MD;  Location: [facility];  Service: Orthopedics;  Laterality: Left;    reports that she quit smoking about 8 years ago. Her smoking use included cigarettes. She has a 3.60 pack-year smoking history. She has never used smokeless tobacco. She reports that she does not drink alcohol and does not use drugs. family history includes Clotting disorder in her father; Heart disease in her mother; Stroke in her father and mother. No Known Allergies Current Outpatient Medications on File Prior to Visit  Medication Sig Dispense Refill  . albuterol (PROVENTIL HFA;VENTOLIN HFA) 108 (90 BASE) MCG/ACT inhaler Inhale 2 puffs into the lungs every 4 (four) hours as needed. For wheeze or shortness of breath (Patient taking differently: Inhale 2 puffs into the lungs every 4 (four) hours as needed for wheezing or shortness of breath. ) 1 Inhaler 6  . alendronate (FOSAMAX) 70 MG tablet TAKE 1 TABLET (70 MG TOTAL) BY MOUTH EVERY 7 (SEVEN) DAYS. TAKE WITH A FULL GLASS OF WATER ON AN EMPTY STOMACH. 12 tablet 1  . apixaban (ELIQUIS) 2.5 MG TABS tablet Take  1 tablet (2.5 mg total) by mouth every 12 (twelve) hours. 60 tablet 0  . aspirin 81 MG tablet Take 81 mg by mouth daily.      Marland Kitchen azelastine (ASTELIN) 0.1 % nasal spray Place 1 spray into both nostrils daily as needed for rhinitis or allergies.     Marland Kitchen docusate sodium (COLACE) 100 MG capsule Take 1 capsule (100 mg total) by mouth 2 (two) times daily. 60 capsule 1  . EPINEPHrine 0.3 mg/0.3 mL IJ SOAJ injection epinephrine 0.3 mg/0.3 mL injection, auto-injector    . ferrous sulfate 325 (65 FE) MG EC tablet Take 325 mg by mouth daily.    . fluticasone (FLONASE) 50  MCG/ACT nasal spray Place 1 spray into both nostrils 2 (two) times daily as needed for allergies or rhinitis.    . furosemide (LASIX) 40 MG tablet Take 2 tablets (80 mg total) by mouth daily. OFFICE VISIT IS DUE FOR REFILLS 180 tablet 0  . HYDROcodone-acetaminophen (NORCO/VICODIN) 5-325 MG tablet hydrocodone 5 mg-acetaminophen 325 mg tablet  TAKE 1 TABLET TWICE A DAY BY ORAL ROUTE AS NEEDED FOR PAIN FOR 5 DAYS.    Marland Kitchen KLOR-CON M20 20 MEQ tablet TAKE 1 TABLET BY MOUTH EVERY DAY 90 tablet 0  . meloxicam (MOBIC) 15 MG tablet TAKE 1 TABLET BY MOUTH EVERY DAY AS NEEDED FOR PAIN 30 tablet 5  . Menthol (ICY HOT BACK EXTRA STRENGTH) 5 % PTCH Apply 1 patch topically daily as needed (pain).    . montelukast (SINGULAIR) 10 MG tablet Take 10 mg by mouth every evening.    Marland Kitchen omeprazole (PRILOSEC) 40 MG capsule TAKE 1 CAPSULE BY MOUTH EVERY DAY (Patient taking differently: Take 40 mg by mouth daily. ) 90 capsule 1  . ondansetron (ZOFRAN) 4 MG tablet Take 1 tablet (4 mg total) by mouth every 6 (six) hours as needed for nausea. 20 tablet 0  . pravastatin (PRAVACHOL) 40 MG tablet TAKE 1 TABLET BY MOUTH EVERY DAY 90 tablet 1  . predniSONE (STERAPRED UNI-PAK 21 TAB) 5 MG (21) TBPK tablet Take by mouth.    . senna (SENOKOT) 8.6 MG TABS tablet Take 2 tablets (17.2 mg total) by mouth at bedtime. 60 tablet 1  . Tiotropium Bromide Monohydrate (SPIRIVA RESPIMAT) 1.25 MCG/ACT AERS Spiriva Respimat 1.25 mcg/actuation solution for inhalation  INHALE 2 PUFFS ONCE A DAY    . traMADol (ULTRAM) 50 MG tablet Take 1 tablet (50 mg total) by mouth every 6 (six) hours as needed. 30 tablet 0  . traZODone (DESYREL) 100 MG tablet TAKE 1 TABLET BY MOUTH EVERYDAY AT BEDTIME 90 tablet 1  . budesonide-formoterol (SYMBICORT) 160-4.5 MCG/ACT inhaler Inhale 2 puffs into the lungs 2 (two) times daily. 1 Inhaler 0   No current facility-administered medications on file prior to visit.   Review of Systems All otherwise neg per pt    Objective:    Physical Exam BP 122/82 (BP Location: Left Arm, Patient Position: Sitting, Cuff Size: Large)   Pulse 79   Temp 98.4 F (36.9 C) (Oral)   Ht 5\' 4"  (1.626 m)   Wt 207 lb (93.9 kg)   SpO2 99%   BMI 35.53 kg/m  VS noted,  Constitutional: Pt appears in NAD HENT: Head: NCAT.  Right Ear: External ear normal.  Left Ear: External ear normal.  Eyes: . Pupils are equal, round, and reactive to light. Conjunctivae and EOM are normal Nose: without d/c or deformity Neck: Neck supple. Gross normal ROM Cardiovascular: Normal rate and regular  rhythm.   Pulmonary/Chest: Effort normal and breath sounds without rales or wheezing.  Abd:  Soft, NT, ND, + BS, no organomegaly Neurological: Pt is alert. At baseline orientation, motor grossly intact Skin: Skin is warm. No rashes, other new lesions, no LE edema Psychiatric: Pt behavior is normal without agitation  All otherwise neg per pt Lab Results  Component Value Date   WBC 3.9 (L) 11/07/2020   HGB 12.9 11/07/2020   HCT 40.2 11/07/2020   PLT 208.0 11/07/2020   GLUCOSE 102 (H) 11/07/2020   CHOL 159 11/07/2020   TRIG 83.0 11/07/2020   HDL 59.60 11/07/2020   LDLCALC 83 11/07/2020   ALT 16 11/07/2020   AST 19 11/07/2020   NA 136 11/07/2020   K 4.4 11/07/2020   CL 102 11/07/2020   CREATININE 0.78 11/07/2020   BUN 17 11/07/2020   CO2 27 11/07/2020   TSH 1.88 11/07/2020   INR 1.14 06/16/2011   HGBA1C 5.7 11/07/2020      Assessment & Plan:

## 2020-11-07 ENCOUNTER — Encounter: Payer: Self-pay | Admitting: Internal Medicine

## 2020-11-07 ENCOUNTER — Other Ambulatory Visit (INDEPENDENT_AMBULATORY_CARE_PROVIDER_SITE_OTHER): Payer: BC Managed Care – PPO

## 2020-11-07 ENCOUNTER — Other Ambulatory Visit: Payer: Self-pay | Admitting: Internal Medicine

## 2020-11-07 DIAGNOSIS — E559 Vitamin D deficiency, unspecified: Secondary | ICD-10-CM | POA: Diagnosis not present

## 2020-11-07 DIAGNOSIS — R739 Hyperglycemia, unspecified: Secondary | ICD-10-CM

## 2020-11-07 DIAGNOSIS — E538 Deficiency of other specified B group vitamins: Secondary | ICD-10-CM | POA: Diagnosis not present

## 2020-11-07 DIAGNOSIS — E7800 Pure hypercholesterolemia, unspecified: Secondary | ICD-10-CM

## 2020-11-07 LAB — CBC WITH DIFFERENTIAL/PLATELET
Basophils Absolute: 0 10*3/uL (ref 0.0–0.1)
Basophils Relative: 0.5 % (ref 0.0–3.0)
Eosinophils Absolute: 0 10*3/uL (ref 0.0–0.7)
Eosinophils Relative: 0.8 % (ref 0.0–5.0)
HCT: 40.2 % (ref 36.0–46.0)
Hemoglobin: 12.9 g/dL (ref 12.0–15.0)
Lymphocytes Relative: 29.1 % (ref 12.0–46.0)
Lymphs Abs: 1.2 10*3/uL (ref 0.7–4.0)
MCHC: 32.2 g/dL (ref 30.0–36.0)
MCV: 84.1 fl (ref 78.0–100.0)
Monocytes Absolute: 0.3 10*3/uL (ref 0.1–1.0)
Monocytes Relative: 7.4 % (ref 3.0–12.0)
Neutro Abs: 2.5 10*3/uL (ref 1.4–7.7)
Neutrophils Relative %: 62.2 % (ref 43.0–77.0)
Platelets: 208 10*3/uL (ref 150.0–400.0)
RBC: 4.78 Mil/uL (ref 3.87–5.11)
RDW: 14.2 % (ref 11.5–15.5)
WBC: 3.9 10*3/uL — ABNORMAL LOW (ref 4.0–10.5)

## 2020-11-07 LAB — BASIC METABOLIC PANEL
BUN: 17 mg/dL (ref 6–23)
CO2: 27 mEq/L (ref 19–32)
Calcium: 9.3 mg/dL (ref 8.4–10.5)
Chloride: 102 mEq/L (ref 96–112)
Creatinine, Ser: 0.78 mg/dL (ref 0.40–1.20)
GFR: 73.07 mL/min (ref >60.00)
Glucose, Bld: 102 mg/dL — ABNORMAL HIGH (ref 70–99)
Potassium: 4.4 mEq/L (ref 3.5–5.1)
Sodium: 136 mEq/L (ref 135–145)

## 2020-11-07 LAB — URINALYSIS, ROUTINE W REFLEX MICROSCOPIC
Bilirubin Urine: NEGATIVE
Hgb urine dipstick: NEGATIVE
Ketones, ur: NEGATIVE
Leukocytes,Ua: NEGATIVE
Nitrite: NEGATIVE
RBC / HPF: NONE SEEN (ref 0–?)
Specific Gravity, Urine: 1.015 (ref 1.000–1.030)
Total Protein, Urine: NEGATIVE
Urine Glucose: NEGATIVE
Urobilinogen, UA: 0.2 (ref 0.0–1.0)
pH: 6.5 (ref 5.0–8.0)

## 2020-11-07 LAB — HEPATIC FUNCTION PANEL
ALT: 16 U/L (ref 0–35)
AST: 19 U/L (ref 0–37)
Albumin: 4.1 g/dL (ref 3.5–5.2)
Alkaline Phosphatase: 73 U/L (ref 39–117)
Bilirubin, Direct: 0.1 mg/dL (ref 0.0–0.3)
Total Bilirubin: 0.5 mg/dL (ref 0.2–1.2)
Total Protein: 7.3 g/dL (ref 6.0–8.3)

## 2020-11-07 LAB — LIPID PANEL
Cholesterol: 159 mg/dL (ref 0–200)
HDL: 59.6 mg/dL (ref >39.00)
LDL Cholesterol: 83 mg/dL (ref 0–99)
NonHDL: 99.22
Total CHOL/HDL Ratio: 3
Triglycerides: 83 mg/dL (ref 0.0–149.0)
VLDL: 16.6 mg/dL (ref 0.0–40.0)

## 2020-11-07 LAB — TSH: TSH: 1.88 u[IU]/mL (ref 0.35–4.50)

## 2020-11-07 LAB — VITAMIN D 25 HYDROXY (VIT D DEFICIENCY, FRACTURES): VITD: 28.52 ng/mL — ABNORMAL LOW (ref 30.00–100.00)

## 2020-11-07 LAB — HEMOGLOBIN A1C: Hgb A1c MFr Bld: 5.7 % (ref 4.6–6.5)

## 2020-11-07 LAB — VITAMIN B12: Vitamin B-12: 308 pg/mL (ref 211–911)

## 2020-11-07 MED ORDER — THERA-D 2000 50 MCG (2000 UT) PO TABS: ORAL_TABLET | ORAL | 99 refills | Status: DC

## 2020-11-10 ENCOUNTER — Encounter: Payer: Self-pay | Admitting: Internal Medicine

## 2020-11-10 NOTE — Assessment & Plan Note (Signed)
stable overall by history and exam, recent data reviewed with pt, and pt to continue medical treatment as before,  to f/u any worsening symptoms or concerns  

## 2020-11-10 NOTE — Assessment & Plan Note (Signed)
To continue ortho f/u and PT

## 2020-11-10 NOTE — Assessment & Plan Note (Signed)

## 2020-11-10 NOTE — Assessment & Plan Note (Signed)
Lab Results  Component Value Date   LDLCALC 83 11/07/2020  stable overall by history and exam, recent data reviewed with pt, and pt to continue medical treatment as before,  to f/u any worsening symptoms or concerns

## 2020-11-10 NOTE — Assessment & Plan Note (Addendum)
Etiology unclear, but likely due to overuse, but for labs as ordered, tizanidine prn,  to f/u any worsening symptoms or concerns  I spent 31 minutes in addition to time for CPX wellness examination in preparing to see the patient by review of recent labs, imaging and procedures, obtaining and reviewing separately obtained history, communicating with the patient and family or caregiver, ordering medications, tests or procedures, and documenting clinical information in the EHR including the differential Dx, treatment, and any further evaluation and other management of leg cramping, copd, DJD, hld, gerd, htn,

## 2020-11-18 ENCOUNTER — Other Ambulatory Visit: Payer: Self-pay | Admitting: Internal Medicine

## 2020-11-18 NOTE — Telephone Encounter (Signed)
Please refill as per office routine med refill policy (all routine meds refilled for 3 mo or monthly per pt preference up to one year from last visit, then month to month grace period for 3 mo, then further med refills will have to be denied)  

## 2020-11-19 DIAGNOSIS — M5416 Radiculopathy, lumbar region: Secondary | ICD-10-CM | POA: Insufficient documentation

## 2020-11-19 DIAGNOSIS — M4316 Spondylolisthesis, lumbar region: Secondary | ICD-10-CM | POA: Insufficient documentation

## 2020-11-19 DIAGNOSIS — M48061 Spinal stenosis, lumbar region without neurogenic claudication: Secondary | ICD-10-CM | POA: Insufficient documentation

## 2020-12-04 ENCOUNTER — Other Ambulatory Visit: Payer: Self-pay

## 2020-12-04 ENCOUNTER — Ambulatory Visit (INDEPENDENT_AMBULATORY_CARE_PROVIDER_SITE_OTHER): Payer: BC Managed Care – PPO | Admitting: Podiatry

## 2020-12-04 ENCOUNTER — Encounter: Payer: Self-pay | Admitting: Podiatry

## 2020-12-04 DIAGNOSIS — M79675 Pain in left toe(s): Secondary | ICD-10-CM

## 2020-12-04 DIAGNOSIS — Z89422 Acquired absence of other left toe(s): Secondary | ICD-10-CM

## 2020-12-04 DIAGNOSIS — M79674 Pain in right toe(s): Secondary | ICD-10-CM | POA: Diagnosis not present

## 2020-12-04 DIAGNOSIS — B351 Tinea unguium: Secondary | ICD-10-CM | POA: Diagnosis not present

## 2020-12-04 DIAGNOSIS — J4540 Moderate persistent asthma, uncomplicated: Secondary | ICD-10-CM | POA: Insufficient documentation

## 2020-12-04 DIAGNOSIS — J301 Allergic rhinitis due to pollen: Secondary | ICD-10-CM | POA: Insufficient documentation

## 2020-12-04 DIAGNOSIS — J3081 Allergic rhinitis due to animal (cat) (dog) hair and dander: Secondary | ICD-10-CM | POA: Insufficient documentation

## 2020-12-06 NOTE — Progress Notes (Signed)
Subjective:  Patient ID: [name], female    DOB: 11-26-1941,  MRN: [id number]  80 y.o. female presents with at risk foot care. 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 Pain interferes with ambulation. Aggravating factors include wearing enclosed shoe gear. Pain is relieved with periodic professional debridement.   Patient states she will be seeing Ortho for left hip pain. She states she did not take her furosemide this morning. She has been using her heel protector and her heel feels better.  Review of Systems: Negative except as noted in the HPI.  Past Medical History:  Diagnosis Date  . Allergic rhinitis   . Arnold-Chiari malformation ([place])   . Asthma   . CAD (coronary artery disease)   . COPD (chronic obstructive pulmonary disease) ([place])   . Coughing    coughing since last 01-22-2019 , started on cefdinir bid x10 days, has 3 pills left today , reports she feels mcuh better , still coughing copiuus amonts of thick white sputum , denies fever nor chills, nor body aches .   Marland Kitchen DJD (degenerative joint disease)   . GERD (gastroesophageal reflux disease)   . History of absence seizures    last confirmed seizure age 80    . Hx of colonic polyps   . Hypercholesteremia   . Hypertension   . Obesity   . Positive PPD   . Reflex sympathetic dystrophy   . Varicose veins   . Venous insufficiency    Past Surgical History:  Procedure Laterality Date  . ABDOMINAL HYSTERECTOMY    . cspine surgery  1993   for arnold-chiari malformation  . JOINT REPLACEMENT  06/05/11   Left total knee replacement  . right knee arthroscopy  12/2007   Dr. Tonita Cong  . right total knee replacement  05/2008   Dr. Tonita Cong  . TOTAL KNEE REVISION Left 02/03/2019   Procedure: LEFT TOTAL KNEE REVISION;  Surgeon: Rod Can, MD;  Location: [facility];  Service: Orthopedics;  Laterality: Left;   Patient Active Problem List   Diagnosis Date Noted  .  Allergic rhinitis due to animal (cat) (dog) hair and dander 12/04/2020  . Allergic rhinitis due to pollen 12/04/2020  . Moderate persistent asthma, uncomplicated 86/57/8469  . Lumbar radiculopathy 11/19/2020  . Spinal stenosis of lumbar region 11/19/2020  . Leg cramping 11/06/2020  . Degeneration of lumbar intervertebral disc 09/27/2020  . Degenerative spondylolisthesis 08/15/2020  . Pes anserinus bursitis of right knee 05/07/2020  . Ulcer of toe, left, limited to breakdown of skin ([place]) 08/31/2019  . Diastolic CHF, chronic ([place]) 08/31/2019  . PVD (peripheral vascular disease) ([place]) 08/31/2019  . Bilateral carpal tunnel syndrome 08/19/2019  . Posterior neck pain 08/19/2019  . Vertigo 04/08/2019  . Anemia 04/08/2019  . Stiffness of left knee 02/10/2019  . Failed total knee, left ([place]) 02/03/2019  . Failed total knee, left, initial encounter ([place]) 02/03/2019  . Carpal tunnel syndrome of left wrist 01/21/2019  . Pain of left hand 01/21/2019  . Preop exam for internal medicine 11/02/2018  . Left hand paresthesia 11/02/2018  . 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 Hyperopia with presbyopia, bilateral 06/29/2018  . Abdominal pain, lower 03/02/2018  . Diarrhea 03/02/2018  . Encounter for well adult exam with abnormal findings 11/14/2017  . Arnold-Chiari malformation ([place])   . Asthma   . CAD (coronary artery disease)   . COPD (chronic obstructive pulmonary  disease) ([place])   . DJD (degenerative joint disease)   . History of absence seizures   . Hx of colonic polyps   . Hypercholesteremia   . Positive PPD   . Venous insufficiency   . Pain in left lower leg 07/20/2017  . Cellulitis of left lower extremity 06/18/2017  . Bilateral lower extremity edema 04/30/2017  . Shingles 02/26/2017  . COPD exacerbation ([place]) 01/07/2017  . S/P TKR (total knee replacement), bilateral 01/07/2017  . Right arm pain 05/16/2015  . Generalized anxiety disorder 08/31/2014  .  Varicose veins of lower extremities with other complications 08/65/7846  . Edema 08/07/2011  . MENORRHAGIA, POSTMENOPAUSAL 08/13/2010  . CIGARETTE SMOKER 06/11/2010  . CARDIAC MURMUR 11/02/2009  . BRADYCARDIA 11/01/2009  . CHEST PAIN 11/01/2009  . Allergic rhinitis 04/19/2009  . Chronic obstructive airway disease with asthma ([place]) 04/18/2009  . LEG CRAMPS, NOCTURNAL 11/02/2008  . Essential hypertension 01/17/2008  . COLONIC POLYPS 01/14/2008  . Obesity 01/14/2008  . Reflex sympathetic dystrophy 01/14/2008  . Coronary atherosclerosis 01/14/2008  . GERD 01/14/2008  . Osteoarthritis 01/14/2008  . BACK PAIN, LUMBAR 01/14/2008  . SEIZURES, HX OF 01/14/2008    Current Outpatient Medications:  .  albuterol (PROVENTIL HFA;VENTOLIN HFA) 108 (90 BASE) MCG/ACT inhaler, Inhale 2 puffs into the lungs every 4 (four) hours as needed. For wheeze or shortness of breath (Patient taking differently: Inhale 2 puffs into the lungs every 4 (four) hours as needed for wheezing or shortness of breath.), Disp: 1 Inhaler, Rfl: 6 .  alendronate (FOSAMAX) 70 MG tablet, TAKE 1 TABLET (70 MG TOTAL) BY MOUTH EVERY 7 (SEVEN) DAYS. TAKE WITH A FULL GLASS OF WATER ON AN EMPTY STOMACH., Disp: 12 tablet, Rfl: 1 .  apixaban (ELIQUIS) 2.5 MG TABS tablet, Take 1 tablet (2.5 mg total) by mouth every 12 (twelve) hours., Disp: 60 tablet, Rfl: 0 .  aspirin 81 MG tablet, Take 81 mg by mouth daily., Disp: , Rfl:  .  azelastine (ASTELIN) 0.1 % nasal spray, Place 1 spray into both nostrils daily as needed for rhinitis or allergies. , Disp: , Rfl:  .  Cholecalciferol (THERA-D 2000) 50 MCG (2000 UT) TABS, 1 tab by mouth once daily, Disp: 30 tablet, Rfl: 99 .  docusate sodium (COLACE) 100 MG capsule, Take 1 capsule (100 mg total) by mouth 2 (two) times daily., Disp: 60 capsule, Rfl: 1 .  EPINEPHrine 0.3 mg/0.3 mL IJ SOAJ injection, epinephrine 0.3 mg/0.3 mL injection, auto-injector, Disp: , Rfl:  .  ferrous sulfate 325 (65 FE) MG EC  tablet, Take 325 mg by mouth daily., Disp: , Rfl:  .  fluticasone (FLONASE) 50 MCG/ACT nasal spray, Place 1 spray into both nostrils 2 (two) times daily as needed for allergies or rhinitis., Disp: , Rfl:  .  furosemide (LASIX) 40 MG tablet, TAKE 2 TABLETS (80 MG TOTAL) BY MOUTH DAILY. OFFICE VISIT IS DUE FOR REFILLS, Disp: 180 tablet, Rfl: 0 .  HYDROcodone-acetaminophen (NORCO/VICODIN) 5-325 MG tablet, hydrocodone 5 mg-acetaminophen 325 mg tablet  TAKE 1 TABLET TWICE A DAY BY ORAL ROUTE AS NEEDED FOR PAIN FOR 5 DAYS., Disp: , Rfl:  .  KLOR-CON M20 20 MEQ tablet, TAKE 1 TABLET BY MOUTH EVERY DAY, Disp: 90 tablet, Rfl: 0 .  meloxicam (MOBIC) 15 MG tablet, TAKE 1 TABLET BY MOUTH EVERY DAY AS NEEDED FOR PAIN, Disp: 30 tablet, Rfl: 5 .  Menthol 5 % PTCH, Apply 1 patch topically daily as needed (pain)., Disp: , Rfl:  .  montelukast (  SINGULAIR) 10 MG tablet, Take 10 mg by mouth every evening., Disp: , Rfl:  .  omeprazole (PRILOSEC) 40 MG capsule, TAKE 1 CAPSULE BY MOUTH EVERY DAY (Patient taking differently: Take 40 mg by mouth daily.), Disp: 90 capsule, Rfl: 1 .  ondansetron (ZOFRAN) 4 MG tablet, Take 1 tablet (4 mg total) by mouth every 6 (six) hours as needed for nausea., Disp: 20 tablet, Rfl: 0 .  oxyCODONE-acetaminophen (PERCOCET) 10-325 MG tablet, oxycodone-acetaminophen 10 mg-325 mg tablet, Disp: , Rfl:  .  pravastatin (PRAVACHOL) 40 MG tablet, TAKE 1 TABLET BY MOUTH EVERY DAY, Disp: 90 tablet, Rfl: 1 .  predniSONE (STERAPRED UNI-PAK 21 TAB) 5 MG (21) TBPK tablet, Take by mouth., Disp: , Rfl:  .  senna (SENOKOT) 8.6 MG TABS tablet, Take 2 tablets (17.2 mg total) by mouth at bedtime., Disp: 60 tablet, Rfl: 1 .  Sodium Chloride-Sodium Bicarb (SINUS [place] SQUEEZE BOTTLE) 2300-700 MG KIT, See admin instructions., Disp: , Rfl:  .  Tiotropium Bromide Monohydrate (SPIRIVA RESPIMAT) 1.25 MCG/ACT AERS, Spiriva Respimat 1.25 mcg/actuation solution for inhalation  INHALE 2 PUFFS ONCE A DAY, Disp: , Rfl:  .   tiZANidine (ZANAFLEX) 2 MG tablet, Take 1 tablet (2 mg total) by mouth every 6 (six) hours as needed for muscle spasms., Disp: 40 tablet, Rfl: 1 .  traMADol (ULTRAM) 50 MG tablet, Take 1 tablet (50 mg total) by mouth every 6 (six) hours as needed., Disp: 30 tablet, Rfl: 0 .  traZODone (DESYREL) 100 MG tablet, TAKE 1 TABLET BY MOUTH EVERYDAY AT BEDTIME, Disp: 90 tablet, Rfl: 1 .  budesonide-formoterol (SYMBICORT) 160-4.5 MCG/ACT inhaler, Inhale 2 puffs into the lungs 2 (two) times daily., Disp: 1 Inhaler, Rfl: 0 No Known Allergies Social History   Occupational History  . Occupation: retired  Tobacco Use  . Smoking status: Former Smoker    Packs/day: 0.30    Years: 12.00    Pack years: 3.60    Types: Cigarettes    Quit date: 01/02/2012    Years since quitting: 8.9  . Smokeless tobacco: Never Used  . Tobacco comment: 1 pack per week  Vaping Use  . Vaping Use: Never used  Substance and Sexual Activity  . Alcohol use: No  . Drug use: No  . Sexual activity: Not on file    Objective:   Constitutional Pt is a pleasant 80 y.o. African American female in NAD.Marland Kitchen AAO x 3.   Vascular Capillary refill time to digits immediate b/l. Palpable pedal pulses b/l LE. Pedal hair sparse. Lower extremity skin temperature gradient within normal limits. No pain with calf compression b/l. Trace edema noted b/l lower extremities. No cyanosis or clubbing noted.  Neurologic Normal speech. Oriented to person, place, and time. Protective sensation intact 5/5 intact bilaterally with 10g monofilament b/l. Vibratory sensation intact b/l. Proprioception intact bilaterally.  Dermatologic Pedal skin with normal turgor, texture and tone bilaterally. No open wounds bilaterally. No interdigital macerations bilaterally. Toenails L hallux, L 4th toe, L 5th toe, R hallux, R 3rd toe, R 4th toe and R 5th toe elongated, discolored, dystrophic, thickened, and crumbly with subungual debris and tenderness to dorsal palpation. Anonychia  noted L 3rd toe and R 2nd toe. Nailbed(s) epithelialized.   Orthopedic: Normal muscle strength 5/5 to all lower extremity muscle groups bilaterally. No pain crepitus or joint limitation noted with ROM b/l. Lower extremity amputation(s): digital amputation L 2nd toe.   Radiographs: None Assessment:   1. Pain due to onychomycosis of toenails of both feet  2. Status post amputation of lesser toe of left foot ([place])    Plan:  Patient was evaluated and treated and all questions answered.  Onychomycosis with pain -Nails palliatively debridement as below. -Educated on self-care  Procedure: Nail Debridement Rationale: Pain Type of Debridement: manual, sharp debridement. Instrumentation: Nail nipper, rotary burr. Number of Nails: 10  -Examined patient. -No new findings. No new orders. -Toenails L hallux, L 4th toe, L 5th toe, R hallux, R 3rd toe, R 4th toe and R 5th toe debrided in length and girth without iatrogenic bleeding with sterile nail nipper and dremel.  -Patient to report any pedal injuries to medical professional immediately. -Patient to continue soft, supportive shoe gear daily. -Patient/POA to call should there be question/concern in the interim.  Return in about 3 months (around 03/04/2021).  Marzetta Board, DPM

## 2020-12-10 ENCOUNTER — Other Ambulatory Visit: Payer: Self-pay

## 2020-12-10 DIAGNOSIS — Z20822 Contact with and (suspected) exposure to covid-19: Secondary | ICD-10-CM

## 2020-12-11 DIAGNOSIS — M21379 Foot drop, unspecified foot: Secondary | ICD-10-CM | POA: Insufficient documentation

## 2020-12-11 DIAGNOSIS — R030 Elevated blood-pressure reading, without diagnosis of hypertension: Secondary | ICD-10-CM | POA: Insufficient documentation

## 2020-12-13 ENCOUNTER — Encounter: Payer: Self-pay | Admitting: Internal Medicine

## 2020-12-13 ENCOUNTER — Telehealth (INDEPENDENT_AMBULATORY_CARE_PROVIDER_SITE_OTHER): Payer: BC Managed Care – PPO | Admitting: Internal Medicine

## 2020-12-13 DIAGNOSIS — R059 Cough, unspecified: Secondary | ICD-10-CM | POA: Diagnosis not present

## 2020-12-13 DIAGNOSIS — J449 Chronic obstructive pulmonary disease, unspecified: Secondary | ICD-10-CM

## 2020-12-13 LAB — NOVEL CORONAVIRUS, NAA: SARS-CoV-2, NAA: DETECTED — AB

## 2020-12-13 MED ORDER — DOXYCYCLINE HYCLATE 100 MG PO TABS: 100.0000 mg | ORAL_TABLET | Freq: Two times a day (BID) | ORAL | 0 refills | Status: DC

## 2020-12-13 MED ORDER — PROMETHAZINE-CODEINE 6.25-10 MG/5ML PO SYRP
5.0000 mL | ORAL_SOLUTION | Freq: Four times a day (QID) | ORAL | 0 refills | Status: DC | PRN
Start: 2020-12-13 — End: 2020-12-24

## 2020-12-13 NOTE — Assessment & Plan Note (Signed)
Mild to mod, for antibx course, f/u covid testing results, to f/u any worsening symptoms or concerns 

## 2020-12-13 NOTE — Patient Instructions (Signed)
Please take all new medication as prescribed 

## 2020-12-13 NOTE — Progress Notes (Signed)
Patient ID: [name], female   DOB: Jul 02, 1941, 80 y.o.   MRN: [id number]  Virtual Visit via Video Note  I connected with [name] on 12/13/20 at  1:20 PM EST by a video enabled telemedicine application and verified that I am speaking with the correct person using two identifiers.  Location: of all participants today Patient: at home Provider: at office   I discussed the limitations of evaluation and management by telemedicine and the availability of in person appointments. The patient expressed understanding and agreed to proceed.  History of Present Illness: Here with acute onset mild to mod 2 wks ST, HA, general weakness and malaise, with prod cough greenish sputum, but Pt denies chest pain, increased sob or doe, wheezing, orthopnea, PND, increased LE swelling, palpitations, dizziness or syncope.  COVID testing is pending from mon jan 10.  No sick contacts.   Past Medical History:  Diagnosis Date  . Allergic rhinitis   . Arnold-Chiari malformation ([place])   . Asthma   . CAD (coronary artery disease)   . COPD (chronic obstructive pulmonary disease) ([place])   . Coughing    coughing since last 01-22-2019 , started on cefdinir bid x10 days, has 3 pills left today , reports she feels mcuh better , still coughing copiuus amonts of thick white sputum , denies fever nor chills, nor body aches .   Marland Kitchen DJD (degenerative joint disease)   . GERD (gastroesophageal reflux disease)   . History of absence seizures    last confirmed seizure age 29    . Hx of colonic polyps   . Hypercholesteremia   . Hypertension   . Obesity   . Positive PPD   . Reflex sympathetic dystrophy   . Varicose veins   . Venous insufficiency    Past Surgical History:  Procedure Laterality Date  . ABDOMINAL HYSTERECTOMY    . cspine surgery  1993   for arnold-chiari malformation  . JOINT REPLACEMENT  06/05/11   Left total knee replacement  . right knee arthroscopy  12/2007   Dr. Tonita Cong  . right total knee  replacement  05/2008   Dr. Tonita Cong  . TOTAL KNEE REVISION Left 02/03/2019   Procedure: LEFT TOTAL KNEE REVISION;  Surgeon: Rod Can, MD;  Location: [facility];  Service: Orthopedics;  Laterality: Left;    reports that she quit smoking about 8 years ago. Her smoking use included cigarettes. She has a 3.60 pack-year smoking history. She has never used smokeless tobacco. She reports that she does not drink alcohol and does not use drugs. family history includes Clotting disorder in her father; Heart disease in her mother; Stroke in her father and mother. No Known Allergies Current Outpatient Medications on File Prior to Visit  Medication Sig Dispense Refill  . albuterol (PROVENTIL HFA;VENTOLIN HFA) 108 (90 BASE) MCG/ACT inhaler Inhale 2 puffs into the lungs every 4 (four) hours as needed. For wheeze or shortness of breath (Patient taking differently: Inhale 2 puffs into the lungs every 4 (four) hours as needed for wheezing or shortness of breath.) 1 Inhaler 6  . alendronate (FOSAMAX) 70 MG tablet TAKE 1 TABLET (70 MG TOTAL) BY MOUTH EVERY 7 (SEVEN) DAYS. TAKE WITH A FULL GLASS OF WATER ON AN EMPTY STOMACH. 12 tablet 1  . apixaban (ELIQUIS) 2.5 MG TABS tablet Take 1 tablet (2.5 mg total) by mouth every 12 (twelve) hours. 60 tablet 0  . aspirin 81 MG tablet Take 81 mg by mouth daily.    Marland Kitchen  azelastine (ASTELIN) 0.1 % nasal spray Place 1 spray into both nostrils daily as needed for rhinitis or allergies.     . budesonide-formoterol (SYMBICORT) 160-4.5 MCG/ACT inhaler Inhale 2 puffs into the lungs 2 (two) times daily. 1 Inhaler 0  . Cholecalciferol (THERA-D 2000) 50 MCG (2000 UT) TABS 1 tab by mouth once daily 30 tablet 99  . docusate sodium (COLACE) 100 MG capsule Take 1 capsule (100 mg total) by mouth 2 (two) times daily. 60 capsule 1  . EPINEPHrine 0.3 mg/0.3 mL IJ SOAJ injection epinephrine 0.3 mg/0.3 mL injection, auto-injector    . ferrous sulfate 325 (65 FE) MG EC tablet Take 325 mg by mouth daily.     . fluticasone (FLONASE) 50 MCG/ACT nasal spray Place 1 spray into both nostrils 2 (two) times daily as needed for allergies or rhinitis.    . furosemide (LASIX) 40 MG tablet TAKE 2 TABLETS (80 MG TOTAL) BY MOUTH DAILY. OFFICE VISIT IS DUE FOR REFILLS 180 tablet 0  . HYDROcodone-acetaminophen (NORCO/VICODIN) 5-325 MG tablet hydrocodone 5 mg-acetaminophen 325 mg tablet  TAKE 1 TABLET TWICE A DAY BY ORAL ROUTE AS NEEDED FOR PAIN FOR 5 DAYS.    Marland Kitchen KLOR-CON M20 20 MEQ tablet TAKE 1 TABLET BY MOUTH EVERY DAY 90 tablet 0  . meloxicam (MOBIC) 15 MG tablet TAKE 1 TABLET BY MOUTH EVERY DAY AS NEEDED FOR PAIN 30 tablet 5  . Menthol 5 % PTCH Apply 1 patch topically daily as needed (pain).    . montelukast (SINGULAIR) 10 MG tablet Take 10 mg by mouth every evening.    Marland Kitchen omeprazole (PRILOSEC) 40 MG capsule TAKE 1 CAPSULE BY MOUTH EVERY DAY (Patient taking differently: Take 40 mg by mouth daily.) 90 capsule 1  . ondansetron (ZOFRAN) 4 MG tablet Take 1 tablet (4 mg total) by mouth every 6 (six) hours as needed for nausea. 20 tablet 0  . oxyCODONE-acetaminophen (PERCOCET) 10-325 MG tablet oxycodone-acetaminophen 10 mg-325 mg tablet    . pravastatin (PRAVACHOL) 40 MG tablet TAKE 1 TABLET BY MOUTH EVERY DAY 90 tablet 1  . predniSONE (STERAPRED UNI-PAK 21 TAB) 5 MG (21) TBPK tablet Take by mouth.    . senna (SENOKOT) 8.6 MG TABS tablet Take 2 tablets (17.2 mg total) by mouth at bedtime. 60 tablet 1  . Sodium Chloride-Sodium Bicarb (SINUS [place] SQUEEZE BOTTLE) 2300-700 MG KIT See admin instructions.    . Tiotropium Bromide Monohydrate (SPIRIVA RESPIMAT) 1.25 MCG/ACT AERS Spiriva Respimat 1.25 mcg/actuation solution for inhalation  INHALE 2 PUFFS ONCE A DAY    . tiZANidine (ZANAFLEX) 2 MG tablet Take 1 tablet (2 mg total) by mouth every 6 (six) hours as needed for muscle spasms. 40 tablet 1  . traMADol (ULTRAM) 50 MG tablet Take 1 tablet (50 mg total) by mouth every 6 (six) hours as needed. 30 tablet 0  . traZODone  (DESYREL) 100 MG tablet TAKE 1 TABLET BY MOUTH EVERYDAY AT BEDTIME 90 tablet 1   No current facility-administered medications on file prior to visit.    Observations/Objective: Alert, NAD, appropriate mood and affect, resps normal, cn 2-12 intact, moves all 4s, no visible rash or swelling Lab Results  Component Value Date   WBC 3.9 (L) 11/07/2020   HGB 12.9 11/07/2020   HCT 40.2 11/07/2020   PLT 208.0 11/07/2020   GLUCOSE 102 (H) 11/07/2020   CHOL 159 11/07/2020   TRIG 83.0 11/07/2020   HDL 59.60 11/07/2020   LDLCALC 83 11/07/2020   ALT 16 11/07/2020  AST 19 11/07/2020   NA 136 11/07/2020   K 4.4 11/07/2020   CL 102 11/07/2020   CREATININE 0.78 11/07/2020   BUN 17 11/07/2020   CO2 27 11/07/2020   TSH 1.88 11/07/2020   INR 1.14 06/16/2011   HGBA1C 5.7 11/07/2020   Assessment and Plan: See notes  Follow Up Instructions: See notes   I discussed the assessment and treatment plan with the patient. The patient was provided an opportunity to ask questions and all were answered. The patient agreed with the plan and demonstrated an understanding of the instructions.   The patient was advised to call back or seek an in-person evaluation if the symptoms worsen or if the condition fails to improve as anticipated.   Cathlean Cower, MD

## 2020-12-13 NOTE — Assessment & Plan Note (Signed)
Stable, does not appaer to need steroid tx at this time, cont inhaler use prn

## 2020-12-14 ENCOUNTER — Telehealth: Payer: Self-pay | Admitting: Physician Assistant

## 2020-12-14 ENCOUNTER — Telehealth: Payer: Self-pay | Admitting: Internal Medicine

## 2020-12-14 NOTE — Telephone Encounter (Signed)
Patient called and has questions about her Covid 19 results. She can be reached at [phone].

## 2020-12-14 NOTE — Telephone Encounter (Signed)
Called to discuss with [name] about Covid symptoms and the use of sotrovimab, remdisivir or oral therapies for those with mild to moderate Covid symptoms and at a high risk of hospitalization.    Pt does not qualify as pt's symptoms first presented > 10 days prior to timing of infusion. Symptoms tier reviewed as well as criteria for ending isolation. Preventative practices reviewed. Patient verbalized understanding    Patient Active Problem List   Diagnosis Date Noted  . Cough 12/13/2020  . Allergic rhinitis due to animal (cat) (dog) hair and dander 12/04/2020  . Allergic rhinitis due to pollen 12/04/2020  . Moderate persistent asthma, uncomplicated 33/54/5625  . Lumbar radiculopathy 11/19/2020  . Spinal stenosis of lumbar region 11/19/2020  . Leg cramping 11/06/2020  . Degeneration of lumbar intervertebral disc 09/27/2020  . Degenerative spondylolisthesis 08/15/2020  . Pes anserinus bursitis of right knee 05/07/2020  . Ulcer of toe, left, limited to breakdown of skin ([place]) 08/31/2019  . Diastolic CHF, chronic ([place]) 08/31/2019  . PVD (peripheral vascular disease) ([place]) 08/31/2019  . Bilateral carpal tunnel syndrome 08/19/2019  . Posterior neck pain 08/19/2019  . Vertigo 04/08/2019  . Anemia 04/08/2019  . Stiffness of left knee 02/10/2019  . Failed total knee, left ([place]) 02/03/2019  . Failed total knee, left, initial encounter ([place]) 02/03/2019  . Carpal tunnel syndrome of left wrist 01/21/2019  . Pain of left hand 01/21/2019  . Preop exam for internal medicine 11/02/2018  . Left hand paresthesia 11/02/2018  . 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 Hyperopia with presbyopia, bilateral 06/29/2018  . Abdominal pain, lower 03/02/2018  . Diarrhea 03/02/2018  . Encounter for well adult exam with abnormal findings 11/14/2017  . Arnold-Chiari malformation ([place])   . Asthma   . CAD (coronary artery disease)   . COPD (chronic obstructive pulmonary  disease) ([place])   . DJD (degenerative joint disease)   . History of absence seizures   . Hx of colonic polyps   . Hypercholesteremia   . Positive PPD   . Venous insufficiency   . Pain in left lower leg 07/20/2017  . Cellulitis of left lower extremity 06/18/2017  . Bilateral lower extremity edema 04/30/2017  . Shingles 02/26/2017  . COPD exacerbation ([place]) 01/07/2017  . S/P TKR (total knee replacement), bilateral 01/07/2017  . Right arm pain 05/16/2015  . Generalized anxiety disorder 08/31/2014  . Varicose veins of lower extremities with other complications 63/89/3734  . Edema 08/07/2011  . MENORRHAGIA, POSTMENOPAUSAL 08/13/2010  . CIGARETTE SMOKER 06/11/2010  . CARDIAC MURMUR 11/02/2009  . BRADYCARDIA 11/01/2009  . CHEST PAIN 11/01/2009  . Allergic rhinitis 04/19/2009  . Chronic obstructive airway disease with asthma ([place]) 04/18/2009  . LEG CRAMPS, NOCTURNAL 11/02/2008  . Essential hypertension 01/17/2008  . COLONIC POLYPS 01/14/2008  . Obesity 01/14/2008  . Reflex sympathetic dystrophy 01/14/2008  . Coronary atherosclerosis 01/14/2008  . GERD 01/14/2008  . Osteoarthritis 01/14/2008  . BACK PAIN, LUMBAR 01/14/2008  . SEIZURES, HX OF 01/14/2008    Angelena Form PA-C

## 2020-12-14 NOTE — Telephone Encounter (Signed)
Pt has been informed of results and expressed understanding.  She has been informed to quarantine for 5 days starting today.

## 2020-12-17 NOTE — Progress Notes (Signed)
HPI: FU coronary disease. Cardiac catheterization 2/05 showed mild irregularities in the left main. There was a 40% stenosis in the proximal LAD and a distal 30% lesion. The first diagonal had a 30-40% proximal lesion. The ostium of the LAD by intravascular ultrasound had a luminal area of 3.9 mm squared and percent stenosis 60%. She had nonobstructive disease in the left circumflex and right coronary. Her LV function was normal. Nuclear study 1/16 showed EF 58 and normal perfusion. Echocardiogram April 2019showed normal LV function, mild left ventricular hypertrophy, grade 1 diastolic dysfunction, mild right atrial and right ventricular enlargement and mild tricuspid regurgitation. ABIs May 2019 normal.  Venous Dopplers November 2021 showed no DVT. Since I last saw her,she denies dyspnea, exertional chest pain or syncope.  She has chronic mild pedal edema.  Current Outpatient Medications  Medication Sig Dispense Refill  . albuterol (PROVENTIL HFA;VENTOLIN HFA) 108 (90 BASE) MCG/ACT inhaler Inhale 2 puffs into the lungs every 4 (four) hours as needed. For wheeze or shortness of breath (Patient taking differently: Inhale 2 puffs into the lungs every 4 (four) hours as needed for wheezing or shortness of breath.) 1 Inhaler 6  . alendronate (FOSAMAX) 70 MG tablet TAKE 1 TABLET (70 MG TOTAL) BY MOUTH EVERY 7 (SEVEN) DAYS. TAKE WITH A FULL GLASS OF WATER ON AN EMPTY STOMACH. 12 tablet 1  . apixaban (ELIQUIS) 2.5 MG TABS tablet Take 1 tablet (2.5 mg total) by mouth every 12 (twelve) hours. 60 tablet 0  . aspirin 81 MG tablet Take 81 mg by mouth daily.    Marland Kitchen azelastine (ASTELIN) 0.1 % nasal spray Place 1 spray into both nostrils daily as needed for rhinitis or allergies.     . budesonide-formoterol (SYMBICORT) 160-4.5 MCG/ACT inhaler Inhale 2 puffs into the lungs 2 (two) times daily. 1 Inhaler 0  . Cholecalciferol (THERA-D 2000) 50 MCG (2000 UT) TABS 1 tab by mouth once daily 30 tablet 99  . docusate  sodium (COLACE) 100 MG capsule Take 1 capsule (100 mg total) by mouth 2 (two) times daily. 60 capsule 1  . doxycycline (VIBRA-TABS) 100 MG tablet Take 1 tablet (100 mg total) by mouth 2 (two) times daily. 20 tablet 0  . EPINEPHrine 0.3 mg/0.3 mL IJ SOAJ injection epinephrine 0.3 mg/0.3 mL injection, auto-injector    . ferrous sulfate 325 (65 FE) MG EC tablet Take 325 mg by mouth daily.    . fluticasone (FLONASE) 50 MCG/ACT nasal spray Place 1 spray into both nostrils 2 (two) times daily as needed for allergies or rhinitis.    . furosemide (LASIX) 40 MG tablet TAKE 2 TABLETS (80 MG TOTAL) BY MOUTH DAILY. OFFICE VISIT IS DUE FOR REFILLS 180 tablet 0  . KLOR-CON M20 20 MEQ tablet TAKE 1 TABLET BY MOUTH EVERY DAY 90 tablet 0  . meloxicam (MOBIC) 15 MG tablet TAKE 1 TABLET BY MOUTH EVERY DAY AS NEEDED FOR PAIN 30 tablet 5  . Menthol 5 % PTCH Apply 1 patch topically daily as needed (pain).    . montelukast (SINGULAIR) 10 MG tablet Take 10 mg by mouth every evening.    Marland Kitchen omeprazole (PRILOSEC) 40 MG capsule TAKE 1 CAPSULE BY MOUTH EVERY DAY (Patient taking differently: Take 40 mg by mouth daily.) 90 capsule 1  . pravastatin (PRAVACHOL) 40 MG tablet TAKE 1 TABLET BY MOUTH EVERY DAY 90 tablet 1  . senna (SENOKOT) 8.6 MG TABS tablet Take 2 tablets (17.2 mg total) by mouth at bedtime. [place]  tablet 1  . Sodium Chloride-Sodium Bicarb (SINUS [place] SQUEEZE BOTTLE) 2300-700 MG KIT See admin instructions.    . Tiotropium Bromide Monohydrate (SPIRIVA RESPIMAT) 1.25 MCG/ACT AERS Spiriva Respimat 1.25 mcg/actuation solution for inhalation  INHALE 2 PUFFS ONCE A DAY    . tiZANidine (ZANAFLEX) 2 MG tablet Take 1 tablet (2 mg total) by mouth every 6 (six) hours as needed for muscle spasms. 40 tablet 1  . traMADol (ULTRAM) 50 MG tablet Take 1 tablet (50 mg total) by mouth every 6 (six) hours as needed. 30 tablet 0  . traZODone (DESYREL) 100 MG tablet TAKE 1 TABLET BY MOUTH EVERYDAY AT BEDTIME 90 tablet 1   No current  facility-administered medications for this visit.     Past Medical History:  Diagnosis Date  . Allergic rhinitis   . Arnold-Chiari malformation ([place])   . Asthma   . CAD (coronary artery disease)   . COPD (chronic obstructive pulmonary disease) ([place])   . Coughing    coughing since last 01-22-2019 , started on cefdinir bid x10 days, has 3 pills left today , reports she feels mcuh better , still coughing copiuus amonts of thick white sputum , denies fever nor chills, nor body aches .   Marland Kitchen DJD (degenerative joint disease)   . GERD (gastroesophageal reflux disease)   . History of absence seizures    last confirmed seizure age 52    . Hx of colonic polyps   . Hypercholesteremia   . Hypertension   . Obesity   . Positive PPD   . Reflex sympathetic dystrophy   . Varicose veins   . Venous insufficiency     Past Surgical History:  Procedure Laterality Date  . ABDOMINAL HYSTERECTOMY    . cspine surgery  1993   for arnold-chiari malformation  . JOINT REPLACEMENT  06/05/11   Left total knee replacement  . right knee arthroscopy  12/2007   Dr. Tonita Cong  . right total knee replacement  05/2008   Dr. Tonita Cong  . TOTAL KNEE REVISION Left 02/03/2019   Procedure: LEFT TOTAL KNEE REVISION;  Surgeon: Rod Can, MD;  Location: [facility];  Service: Orthopedics;  Laterality: Left;    Social History   Socioeconomic History  . Marital status: Widowed    Spouse name: Not on file  . Number of children: 2  . Years of education: 53  . Highest education level: Not on file  Occupational History  . Occupation: retired  Tobacco Use  . Smoking status: Former Smoker    Packs/day: 0.30    Years: 12.00    Pack years: 3.60    Types: Cigarettes    Quit date: 01/02/2012    Years since quitting: 8.9  . Smokeless tobacco: Never Used  . Tobacco comment: 1 pack per week  Vaping Use  . Vaping Use: Never used  Substance and Sexual Activity  . Alcohol use: No  . Drug use: No  . Sexual activity: Not on file   Other Topics Concern  . Not on file  Social History Narrative   Fun/Hobby: Playing cards and mingling.   Denies abuse and feels safe at home.    Social Determinants of Health   Financial Resource Strain: Not on file  Food Insecurity: Not on file  Transportation Needs: Not on file  Physical Activity: Not on file  Stress: Not on file  Social Connections: Not on file  Intimate Partner Violence: Not on file    Family History  Problem Relation Age  of Onset  . Heart disease Mother   . Stroke Mother   . Stroke Father   . Clotting disorder Father   . Colon cancer Neg Hx   . Stomach cancer Neg Hx     ROS: no fevers or chills, productive cough, hemoptysis, dysphasia, odynophagia, melena, hematochezia, dysuria, hematuria, rash, seizure activity, orthopnea, PND, pedal edema, claudication. Remaining systems are negative.  Physical Exam: Well-developed well-nourished in no acute distress.  Skin is warm and dry.  HEENT is normal.  Neck is supple.  Chest is clear to auscultation with normal expansion.  Cardiovascular exam is regular rate and rhythm.  Abdominal exam nontender or distended. No masses palpated. Extremities show trace edema. neuro grossly intact  A/P  1 coronary artery disease-patient doing well with no chest pain.  Continue medical therapy with aspirin and statin.  2 hypertension-patient's blood pressure is controlled.  Continue present medications and follow.  3 hyperlipidemia-given documented coronary disease I will discontinue pravastatin and instead treat with Crestor 40 mg daily.  Check lipids and liver in 12 weeks.  4 lower extremity edema-continue diuretic at present dose.    Kirk Ruths, MD

## 2020-12-24 ENCOUNTER — Ambulatory Visit (INDEPENDENT_AMBULATORY_CARE_PROVIDER_SITE_OTHER): Payer: BC Managed Care – PPO | Admitting: Cardiology

## 2020-12-24 ENCOUNTER — Other Ambulatory Visit: Payer: Self-pay

## 2020-12-24 ENCOUNTER — Encounter: Payer: Self-pay | Admitting: Cardiology

## 2020-12-24 VITALS — BP 102/55 | HR 54 | Ht 64.0 in | Wt 212.0 lb

## 2020-12-24 DIAGNOSIS — I10 Essential (primary) hypertension: Secondary | ICD-10-CM

## 2020-12-24 DIAGNOSIS — I2510 Atherosclerotic heart disease of native coronary artery without angina pectoris: Secondary | ICD-10-CM | POA: Diagnosis not present

## 2020-12-24 DIAGNOSIS — E7800 Pure hypercholesterolemia, unspecified: Secondary | ICD-10-CM | POA: Diagnosis not present

## 2020-12-24 MED ORDER — ROSUVASTATIN CALCIUM 40 MG PO TABS: 40.0000 mg | ORAL_TABLET | Freq: Every day | ORAL | 3 refills | Status: DC

## 2020-12-24 NOTE — Patient Instructions (Signed)
Medication Instructions:   STOP PRAVASTATIN  START ROSUVASTATIN 40 MG ONCE DAILY  *If you need a refill on your cardiac medications before your next appointment, please call your pharmacy*   Lab Work:  Your physician recommends that you return for lab work in: [place]  If you have labs (blood work) drawn today and your tests are completely normal, you will receive your results only by: Marland Kitchen MyChart Message (if you have MyChart) OR . A paper copy in the mail If you have any lab test that is abnormal or we need to change your treatment, we will call you to review the results.   Follow-Up: At [facility], you and your health needs are our priority.  As part of our continuing mission to provide you with exceptional heart care, we have created designated Provider Care Teams.  These Care Teams include your primary Cardiologist (physician) and Advanced Practice Providers (APPs -  Physician Assistants and Nurse Practitioners) who all work together to provide you with the care you need, when you need it.  We recommend signing up for the patient portal called "MyChart".  Sign up information is provided on this After Visit Summary.  MyChart is used to connect with patients for Virtual Visits (Telemedicine).  Patients are able to view lab/test results, encounter notes, upcoming appointments, etc.  Non-urgent messages can be sent to your provider as well.   To learn more about what you can do with MyChart, go to [email or web address].    Your next appointment:   12 month(s)  The format for your next appointment:   In Person  Provider:   Kirk Ruths, MD

## 2020-12-26 ENCOUNTER — Other Ambulatory Visit: Payer: Self-pay | Admitting: Internal Medicine

## 2021-01-07 ENCOUNTER — Encounter: Payer: Self-pay | Admitting: Internal Medicine

## 2021-01-07 NOTE — Telephone Encounter (Signed)
error 

## 2021-01-08 ENCOUNTER — Ambulatory Visit: Payer: BC Managed Care – PPO | Attending: Internal Medicine

## 2021-01-08 DIAGNOSIS — Z23 Encounter for immunization: Secondary | ICD-10-CM

## 2021-01-08 NOTE — Progress Notes (Signed)
   Covid-19 Vaccination Clinic  Name:  [name]    MRN: [id number] DOB: 10/28/1941  01/08/2021  [name] was observed post Covid-19 immunization for 15 minutes without incident. She was provided with Vaccine Information Sheet and instruction to access the V-Safe system.   [name] was instructed to call 911 with any severe reactions post vaccine: Marland Kitchen Difficulty breathing  . Swelling of face and throat  . A fast heartbeat  . A bad rash all over body  . Dizziness and weakness   Immunizations Administered    Name Date Dose VIS Date Route   PFIZER Comrnaty(Gray TOP) Covid-19 Vaccine 01/08/2021  1:11 PM 0.3 mL 11/08/2020 Intramuscular   Manufacturer: [place]   Lot: [id number]   NDC: [phone]

## 2021-01-09 ENCOUNTER — Ambulatory Visit (INDEPENDENT_AMBULATORY_CARE_PROVIDER_SITE_OTHER): Payer: BC Managed Care – PPO

## 2021-01-09 ENCOUNTER — Encounter: Payer: Self-pay | Admitting: Internal Medicine

## 2021-01-09 ENCOUNTER — Ambulatory Visit: Payer: BC Managed Care – PPO | Admitting: Internal Medicine

## 2021-01-09 ENCOUNTER — Other Ambulatory Visit: Payer: Self-pay

## 2021-01-09 VITALS — BP 122/76 | HR 50 | Temp 98.0°F | Ht 64.0 in | Wt 210.0 lb

## 2021-01-09 DIAGNOSIS — K219 Gastro-esophageal reflux disease without esophagitis: Secondary | ICD-10-CM

## 2021-01-09 DIAGNOSIS — I10 Essential (primary) hypertension: Secondary | ICD-10-CM

## 2021-01-09 DIAGNOSIS — E559 Vitamin D deficiency, unspecified: Secondary | ICD-10-CM | POA: Diagnosis not present

## 2021-01-09 DIAGNOSIS — R059 Cough, unspecified: Secondary | ICD-10-CM | POA: Diagnosis not present

## 2021-01-09 MED ORDER — PANTOPRAZOLE SODIUM 40 MG PO TBEC: 40.0000 mg | DELAYED_RELEASE_TABLET | Freq: Every day | ORAL | 3 refills | Status: DC

## 2021-01-09 MED ORDER — THERA-D 2000 50 MCG (2000 UT) PO TABS
ORAL_TABLET | ORAL | 99 refills | Status: DC
Start: 2021-01-09 — End: 2021-03-08

## 2021-01-09 NOTE — Patient Instructions (Signed)
Ok to stop the prilosec 40 mg per day  Please take all new medication as prescribed - the protonix 40 mg per day  Please take OTC Vitamin D3 at 2000 units per day, indefinitely.  Please continue all other medications as before, and refills have been done if requested.  Please have the pharmacy call with any other refills you may need.  Please continue your efforts at being more active, low cholesterol diet, and weight control  Please keep your appointments with your specialists as you may have planned  Please go to the XRAY Department in the first floor for the x-ray testing  You will be contacted by phone if any changes need to be made immediately.  Otherwise, you will receive a letter about your results with an explanation, but please check with MyChart first.  Please remember to sign up for MyChart if you have not done so, as this will be important to you in the future with finding out test results, communicating by private email, and scheduling acute appointments online when needed.  Please make an Appointment to return in 6 months, or sooner if needed

## 2021-01-09 NOTE — Progress Notes (Signed)
Patient ID: [name], female   DOB: 1941-10-18, 80 y.o.   MRN: [id number]        Chief Complaint: follow up HTN, cough, gerd       HPI:  [name] is a 80 y.o. female here with c/o persistent mild non prod cough for many months, without fever, chills, and Pt denies chest pain, increased sob or doe, wheezing, orthopnea, PND, increased LE swelling, palpitations, dizziness or syncope.  Does also have worsening reflux, but Denies abd pain, dysphagia, n/v, bowel change or blood.   Pt denies polydipsia, polyuria, Denies focal neuro s/s  No other new complaints.  Due for flu shot  Not taking Vit D     Wt Readings from Last 3 Encounters:  01/09/21 210 lb (95.3 kg)  12/24/20 212 lb (96.2 kg)  11/06/20 207 lb (93.9 kg)   BP Readings from Last 3 Encounters:  01/09/21 122/76  12/24/20 (!) 102/55  11/06/20 122/82         Past Medical History:  Diagnosis Date  . Allergic rhinitis   . Arnold-Chiari malformation ([place])   . Asthma   . CAD (coronary artery disease)   . COPD (chronic obstructive pulmonary disease) ([place])   . Coughing    coughing since last 01-22-2019 , started on cefdinir bid x10 days, has 3 pills left today , reports she feels mcuh better , still coughing copiuus amonts of thick white sputum , denies fever nor chills, nor body aches .   Marland Kitchen DJD (degenerative joint disease)   . GERD (gastroesophageal reflux disease)   . History of absence seizures    last confirmed seizure age 37    . Hx of colonic polyps   . Hypercholesteremia   . Hypertension   . Obesity   . Positive PPD   . Reflex sympathetic dystrophy   . Varicose veins   . Venous insufficiency    Past Surgical History:  Procedure Laterality Date  . ABDOMINAL HYSTERECTOMY    . cspine surgery  1993   for arnold-chiari malformation  . JOINT REPLACEMENT  06/05/11   Left total knee replacement  . right knee arthroscopy  12/2007   Dr. Tonita Cong  . right total knee replacement  05/2008   Dr. Tonita Cong  . TOTAL KNEE REVISION Left  02/03/2019   Procedure: LEFT TOTAL KNEE REVISION;  Surgeon: Rod Can, MD;  Location: [facility];  Service: Orthopedics;  Laterality: Left;    reports that she quit smoking about 9 years ago. Her smoking use included cigarettes. She has a 3.60 pack-year smoking history. She has never used smokeless tobacco. She reports that she does not drink alcohol and does not use drugs. family history includes Clotting disorder in her father; Heart disease in her mother; Stroke in her father and mother. No Known Allergies Current Outpatient Medications on File Prior to Visit  Medication Sig Dispense Refill  . albuterol (PROVENTIL HFA;VENTOLIN HFA) 108 (90 BASE) MCG/ACT inhaler Inhale 2 puffs into the lungs every 4 (four) hours as needed. For wheeze or shortness of breath (Patient taking differently: Inhale 2 puffs into the lungs every 4 (four) hours as needed for wheezing or shortness of breath.) 1 Inhaler 6  . alendronate (FOSAMAX) 70 MG tablet TAKE 1 TABLET (70 MG TOTAL) BY MOUTH EVERY 7 (SEVEN) DAYS. TAKE WITH A FULL GLASS OF WATER ON AN EMPTY STOMACH. 12 tablet 1  . aspirin 81 MG tablet Take 81 mg by mouth daily.    Marland Kitchen azelastine (  ASTELIN) 0.1 % nasal spray Place 1 spray into both nostrils daily as needed for rhinitis or allergies.     Marland Kitchen docusate sodium (COLACE) 100 MG capsule Take 1 capsule (100 mg total) by mouth 2 (two) times daily. 60 capsule 1  . EPINEPHrine 0.3 mg/0.3 mL IJ SOAJ injection epinephrine 0.3 mg/0.3 mL injection, auto-injector    . ferrous sulfate 325 (65 FE) MG EC tablet Take 325 mg by mouth daily.    . fluticasone (FLONASE) 50 MCG/ACT nasal spray Place 1 spray into both nostrils 2 (two) times daily as needed for allergies or rhinitis.    . furosemide (LASIX) 40 MG tablet TAKE 2 TABLETS (80 MG TOTAL) BY MOUTH DAILY. OFFICE VISIT IS DUE FOR REFILLS 180 tablet 0  . KLOR-CON M20 20 MEQ tablet TAKE 1 TABLET BY MOUTH EVERY DAY 90 tablet 0  . meloxicam (MOBIC) 15 MG tablet TAKE 1 TABLET BY MOUTH  EVERY DAY AS NEEDED FOR PAIN 30 tablet 5  . Menthol 5 % PTCH Apply 1 patch topically daily as needed (pain).    . montelukast (SINGULAIR) 10 MG tablet Take 10 mg by mouth every evening.    . rosuvastatin (CRESTOR) 40 MG tablet Take 1 tablet (40 mg total) by mouth daily. 90 tablet 3  . senna (SENOKOT) 8.6 MG TABS tablet Take 2 tablets (17.2 mg total) by mouth at bedtime. 60 tablet 1  . Sodium Chloride-Sodium Bicarb (SINUS [place] SQUEEZE BOTTLE) 2300-700 MG KIT See admin instructions.    . Tiotropium Bromide Monohydrate (SPIRIVA RESPIMAT) 1.25 MCG/ACT AERS Spiriva Respimat 1.25 mcg/actuation solution for inhalation  INHALE 2 PUFFS ONCE A DAY    . tiZANidine (ZANAFLEX) 2 MG tablet TAKE 1 TABLET BY MOUTH EVERY 6 HOURS AS NEEDED FOR MUSCLE SPASMS. 40 tablet 1  . traMADol (ULTRAM) 50 MG tablet Take 1 tablet (50 mg total) by mouth every 6 (six) hours as needed. 30 tablet 0  . traZODone (DESYREL) 100 MG tablet TAKE 1 TABLET BY MOUTH EVERYDAY AT BEDTIME 90 tablet 1  . budesonide-formoterol (SYMBICORT) 160-4.5 MCG/ACT inhaler Inhale 2 puffs into the lungs 2 (two) times daily. 1 Inhaler 0  . doxycycline (VIBRA-TABS) 100 MG tablet Take 1 tablet (100 mg total) by mouth 2 (two) times daily. (Patient not taking: Reported on 01/09/2021) 20 tablet 0  . omeprazole (PRILOSEC) 40 MG capsule TAKE 1 CAPSULE BY MOUTH EVERY DAY (Patient not taking: Reported on 01/09/2021) 90 capsule 1   No current facility-administered medications on file prior to visit.        ROS:  All others reviewed and negative.  Objective        PE:  BP 122/76   Pulse (!) 50   Temp 98 F (36.7 C) (Oral)   Ht 5' 4" (1.626 m)   Wt 210 lb (95.3 kg)   SpO2 96%   BMI 36.05 kg/m                 Constitutional: Pt appears in NAD               HENT: Head: NCAT.                Right Ear: External ear normal.                 Left Ear: External ear normal.                Eyes: . Pupils are equal, round, and reactive to light. Conjunctivae and  EOM  are normal               Nose: without d/c or deformity               Neck: Neck supple. Gross normal ROM               Cardiovascular: Normal rate and regular rhythm.                 Pulmonary/Chest: Effort normal and breath sounds without rales or wheezing.                Abd:  Soft, NT, ND, + BS, no organomegaly               Neurological: Pt is alert. At baseline orientation, motor grossly intact               Skin: Skin is warm. No rashes, no other new lesions, LE edema - none               Psychiatric: Pt behavior is normal without agitation   Micro: none  Cardiac tracings I have personally interpreted today:  none  Pertinent Radiological findings (summarize): none   Lab Results  Component Value Date   WBC 3.9 (L) 11/07/2020   HGB 12.9 11/07/2020   HCT 40.2 11/07/2020   PLT 208.0 11/07/2020   GLUCOSE 102 (H) 11/07/2020   CHOL 159 11/07/2020   TRIG 83.0 11/07/2020   HDL 59.60 11/07/2020   LDLCALC 83 11/07/2020   ALT 16 11/07/2020   AST 19 11/07/2020   NA 136 11/07/2020   K 4.4 11/07/2020   CL 102 11/07/2020   CREATININE 0.78 11/07/2020   BUN 17 11/07/2020   CO2 27 11/07/2020   TSH 1.88 11/07/2020   INR 1.14 06/16/2011   HGBA1C 5.7 11/07/2020   Assessment/Plan:  [name] is a 80 y.o. Black or African American [2] female with  has a past medical history of Allergic rhinitis, Arnold-Chiari malformation ([place]), Asthma, CAD (coronary artery disease), COPD (chronic obstructive pulmonary disease) ([facility]), Coughing, DJD (degenerative joint disease), GERD (gastroesophageal reflux disease), History of absence seizures, colonic polyps, Hypercholesteremia, Hypertension, Obesity, Positive PPD, Reflex sympathetic dystrophy, Varicose veins, and Venous insufficiency.  Cough Chronic persistent, mild ? Reflux related, for cxr, start protonix 40 qd, consider pulm referral  Essential hypertension BP Readings from Last 3 Encounters:  01/09/21 122/76  12/24/20 (!) 102/55  11/06/20  122/82   Stable, pt to continue medical treatment  - diet and wt control   GERD For protonix 40 qd,  to f/u any worsening symptoms or concerns  Vitamin D deficiency Last vitamin D Lab Results  Component Value Date   VD25OH 28.52 (L) 11/07/2020   Low, start oral replacement  Followup: Return in about 6 months (around 07/09/2021).  Cathlean Cower, MD 01/16/2021 9:32 PM [facility]

## 2021-01-11 NOTE — Telephone Encounter (Signed)
Patient is requesting a call in regards to her recent x-ray results.

## 2021-01-14 ENCOUNTER — Telehealth: Payer: Self-pay

## 2021-01-14 NOTE — Telephone Encounter (Signed)
Test results give to patient xray negative

## 2021-01-16 ENCOUNTER — Encounter: Payer: Self-pay | Admitting: Internal Medicine

## 2021-01-16 DIAGNOSIS — E559 Vitamin D deficiency, unspecified: Secondary | ICD-10-CM | POA: Insufficient documentation

## 2021-01-16 NOTE — Assessment & Plan Note (Signed)
For protonix 40 qd,  to f/u any worsening symptoms or concerns

## 2021-01-16 NOTE — Assessment & Plan Note (Signed)
Last vitamin D Lab Results  Component Value Date   VD25OH 28.52 (L) 11/07/2020   Low, start oral replacement

## 2021-01-16 NOTE — Assessment & Plan Note (Signed)
BP Readings from Last 3 Encounters:  01/09/21 122/76  12/24/20 (!) 102/55  11/06/20 122/82   Stable, pt to continue medical treatment  - diet and wt control

## 2021-01-16 NOTE — Assessment & Plan Note (Signed)
Chronic persistent, mild ? Reflux related, for cxr, start protonix 40 qd, consider pulm referral

## 2021-03-04 ENCOUNTER — Encounter: Payer: Self-pay | Admitting: Internal Medicine

## 2021-03-04 ENCOUNTER — Other Ambulatory Visit: Payer: Self-pay

## 2021-03-04 ENCOUNTER — Ambulatory Visit: Payer: BC Managed Care – PPO | Admitting: Internal Medicine

## 2021-03-04 VITALS — BP 114/76 | HR 64 | Temp 97.9°F | Ht 64.0 in | Wt 200.0 lb

## 2021-03-04 DIAGNOSIS — R42 Dizziness and giddiness: Secondary | ICD-10-CM | POA: Diagnosis not present

## 2021-03-04 DIAGNOSIS — I10 Essential (primary) hypertension: Secondary | ICD-10-CM | POA: Diagnosis not present

## 2021-03-04 DIAGNOSIS — D649 Anemia, unspecified: Secondary | ICD-10-CM

## 2021-03-04 DIAGNOSIS — R531 Weakness: Secondary | ICD-10-CM

## 2021-03-04 LAB — CBC WITH DIFFERENTIAL/PLATELET
Basophils Absolute: 0 10*3/uL (ref 0.0–0.1)
Basophils Relative: 0.5 % (ref 0.0–3.0)
Eosinophils Absolute: 0.2 10*3/uL (ref 0.0–0.7)
Eosinophils Relative: 3.1 % (ref 0.0–5.0)
HCT: 34.8 % — ABNORMAL LOW (ref 36.0–46.0)
Hemoglobin: 11.4 g/dL — ABNORMAL LOW (ref 12.0–15.0)
Lymphocytes Relative: 28.4 % (ref 12.0–46.0)
Lymphs Abs: 1.4 10*3/uL (ref 0.7–4.0)
MCHC: 32.8 g/dL (ref 30.0–36.0)
MCV: 82.6 fl (ref 78.0–100.0)
Monocytes Absolute: 0.4 10*3/uL (ref 0.1–1.0)
Monocytes Relative: 8 % (ref 3.0–12.0)
Neutro Abs: 3 10*3/uL (ref 1.4–7.7)
Neutrophils Relative %: 60 % (ref 43.0–77.0)
Platelets: 220 10*3/uL (ref 150.0–400.0)
RBC: 4.22 Mil/uL (ref 3.87–5.11)
RDW: 15 % (ref 11.5–15.5)
WBC: 5 10*3/uL (ref 4.0–10.5)

## 2021-03-04 LAB — IBC PANEL
Iron: 63 ug/dL (ref 42–145)
Saturation Ratios: 18.3 % — ABNORMAL LOW (ref 20.0–50.0)
Transferrin: 246 mg/dL (ref 212.0–360.0)

## 2021-03-04 LAB — TSH: TSH: 0.88 u[IU]/mL (ref 0.35–4.50)

## 2021-03-04 LAB — FERRITIN: Ferritin: 504.1 ng/mL — ABNORMAL HIGH (ref 10.0–291.0)

## 2021-03-04 NOTE — Patient Instructions (Signed)
Ok to hold the lasix for today and tomorrow  Ok to restart after that  Please continue all other medications as before  Please have the pharmacy call with any other refills you may need.  Please continue your efforts at being more active, low cholesterol diet, and weight control.  Please keep your appointments with your specialists as you may have planned  Please go to the LAB at the blood drawing area for the tests to be done  You will be contacted by phone if any changes need to be made immediately.  Otherwise, you will receive a letter about your results with an explanation, but please check with MyChart first.  Please remember to sign up for MyChart if you have not done so, as this will be important to you in the future with finding out test results, communicating by private email, and scheduling acute appointments online when needed.  Please make an Appointment to return on [place] or Friday later this week

## 2021-03-04 NOTE — Progress Notes (Signed)
Patient ID: [name], female   DOB: 1941/06/08, 80 y.o.   MRN: [id number]        Chief Complaint: dizzy, weak       HPI:  [name] is a 80 y.o. female here with family with somewhat vague complaints of persistent mild to mod generalized weakness, dizzy and just not feeling well with less stamina, and more sleepy for 1 wk.  No falls.  Pt denies chest pain, increased sob or doe, wheezing, orthopnea, PND, increased LE swelling, palpitations, dizziness or syncope.  Denies new worsening neuro s/s.   Pt denies polydipsia, polyuria, Denies urinary symptoms such as dysuria, frequency, urgency, flank pain, hematuria or n/v, fever, chills.  Denies worsening depressive symptoms, suicidal ideation, or panic.    Taking Vit D.  No other new complaints per pt  Wt Readings from Last 3 Encounters:  03/07/21 216 lb (98 kg)  03/04/21 200 lb (90.7 kg)  01/09/21 210 lb (95.3 kg)   BP Readings from Last 3 Encounters:  03/08/21 137/67  03/04/21 114/76  01/09/21 122/76         Past Medical History:  Diagnosis Date  . Allergic rhinitis   . Arnold-Chiari malformation ([place])   . Asthma   . CAD (coronary artery disease)   . COPD (chronic obstructive pulmonary disease) ([place])   . Coughing    coughing since last 01-22-2019 , started on cefdinir bid x10 days, has 3 pills left today , reports she feels mcuh better , still coughing copiuus amonts of thick white sputum , denies fever nor chills, nor body aches .   Marland Kitchen DJD (degenerative joint disease)   . GERD (gastroesophageal reflux disease)   . History of absence seizures    last confirmed seizure age 17    . Hx of colonic polyps   . Hypercholesteremia   . Hypertension   . Obesity   . Positive PPD   . Reflex sympathetic dystrophy   . Varicose veins   . Venous insufficiency    Past Surgical History:  Procedure Laterality Date  . ABDOMINAL HYSTERECTOMY    . cspine surgery  1993   for arnold-chiari malformation  . JOINT REPLACEMENT  06/05/11   Left  total knee replacement  . right knee arthroscopy  12/2007   Dr. Tonita Cong  . right total knee replacement  05/2008   Dr. Tonita Cong  . TOTAL KNEE REVISION Left 02/03/2019   Procedure: LEFT TOTAL KNEE REVISION;  Surgeon: Rod Can, MD;  Location: [facility];  Service: Orthopedics;  Laterality: Left;    reports that she quit smoking about 9 years ago. Her smoking use included cigarettes. She has a 3.60 pack-year smoking history. She has never used smokeless tobacco. She reports that she does not drink alcohol and does not use drugs. family history includes Clotting disorder in her father; Heart disease in her mother; Stroke in her father and mother. No Known Allergies Current Outpatient Medications on File Prior to Visit  Medication Sig Dispense Refill  . albuterol (PROVENTIL HFA;VENTOLIN HFA) 108 (90 BASE) MCG/ACT inhaler Inhale 2 puffs into the lungs every 4 (four) hours as needed. For wheeze or shortness of breath (Patient taking differently: Inhale 2 puffs into the lungs every 4 (four) hours as needed for wheezing or shortness of breath.) 1 Inhaler 6  . alendronate (FOSAMAX) 70 MG tablet TAKE 1 TABLET (70 MG TOTAL) BY MOUTH EVERY 7 (SEVEN) DAYS. TAKE WITH A FULL GLASS OF WATER ON AN EMPTY STOMACH. 12  tablet 1  . aspirin 81 MG tablet Take 81 mg by mouth daily.    Marland Kitchen azelastine (ASTELIN) 0.1 % nasal spray Place 1 spray into both nostrils daily as needed for rhinitis or allergies.     Marland Kitchen EPINEPHrine 0.3 mg/0.3 mL IJ SOAJ injection Inject 0.3 mg into the muscle as needed for anaphylaxis.    . ferrous sulfate 325 (65 FE) MG EC tablet Take 325 mg by mouth daily.    . fluticasone (FLONASE) 50 MCG/ACT nasal spray Place 1 spray into both nostrils 2 (two) times daily as needed for allergies or rhinitis.    . meloxicam (MOBIC) 15 MG tablet TAKE 1 TABLET BY MOUTH EVERY DAY AS NEEDED FOR PAIN 30 tablet 5  . montelukast (SINGULAIR) 10 MG tablet Take 10 mg by mouth every evening.    . pantoprazole (PROTONIX) 40 MG  tablet Take 1 tablet (40 mg total) by mouth daily. 90 tablet 3  . rosuvastatin (CRESTOR) 40 MG tablet Take 1 tablet (40 mg total) by mouth daily. 90 tablet 3  . Sodium Chloride-Sodium Bicarb (SINUS [place] SQUEEZE BOTTLE) 2300-700 MG KIT See admin instructions.    . Tiotropium Bromide Monohydrate (SPIRIVA RESPIMAT) 1.25 MCG/ACT AERS Inhale 1.25 mcg into the lungs daily.    Marland Kitchen tiZANidine (ZANAFLEX) 2 MG tablet TAKE 1 TABLET BY MOUTH EVERY 6 HOURS AS NEEDED FOR MUSCLE SPASMS. 40 tablet 1  . CVS D3 50 MCG (2000 UT) CAPS Take 1 capsule by mouth daily.    . famotidine (PEPCID) 20 MG tablet Take 20 mg by mouth 2 (two) times daily.     No current facility-administered medications on file prior to visit.        ROS:  All others reviewed and negative.  Objective        PE:  BP 114/76 (BP Location: Left Arm, Patient Position: Sitting, Cuff Size: Large)   Pulse 64   Temp 97.9 F (36.6 C) (Oral)   Ht $R'5\' 4"'au$  (1.626 m)   Wt 200 lb (90.7 kg)   SpO2 98%   BMI 34.33 kg/m                 Constitutional: Pt appears in NAD               HENT: Head: NCAT.                Right Ear: External ear normal.                 Left Ear: External ear normal.                Eyes: . Pupils are equal, round, and reactive to light. Conjunctivae and EOM are normal               Nose: without d/c or deformity               Neck: Neck supple. Gross normal ROM               Cardiovascular: Normal rate and regular rhythm.                 Pulmonary/Chest: Effort normal and breath sounds without rales or wheezing.                Abd:  Soft, NT, ND, + BS, no organomegaly               Neurological: Pt is alert. At baseline orientation, motor grossly intact  Skin: Skin is warm. No rashes, no other new lesions, LE edema - none               Psychiatric: Pt behavior is normal without agitation   Micro: none  Cardiac tracings I have personally interpreted today:  none  Pertinent Radiological findings (summarize):  none   Lab Results  Component Value Date   WBC 3.6 (L) 03/06/2021   HGB 9.9 (L) 03/06/2021   HCT 33.0 (L) 03/06/2021   PLT 175 03/06/2021   GLUCOSE 99 03/08/2021   CHOL 159 11/07/2020   TRIG 83.0 11/07/2020   HDL 59.60 11/07/2020   LDLCALC 83 11/07/2020   ALT 29 03/08/2021   AST 30 03/08/2021   NA 142 03/08/2021   K 3.8 03/08/2021   CL 109 03/08/2021   CREATININE 1.07 (H) 03/08/2021   BUN 9 03/08/2021   CO2 26 03/08/2021   TSH 0.88 03/04/2021   INR 1.14 06/16/2011   HGBA1C 5.7 11/07/2020   Assessment/Plan:  KENNAH HEHR is a 80 y.o. Black or African American [2] female with  has a past medical history of Allergic rhinitis, Arnold-Chiari malformation ([place]), Asthma, CAD (coronary artery disease), COPD (chronic obstructive pulmonary disease) ([facility]), Coughing, DJD (degenerative joint disease), GERD (gastroesophageal reflux disease), History of absence seizures, colonic polyps, Hypercholesteremia, Hypertension, Obesity, Positive PPD, Reflex sympathetic dystrophy, Varicose veins, and Venous insufficiency.  Essential hypertension BP Readings from Last 3 Encounters:  03/08/21 137/67  03/04/21 114/76  01/09/21 122/76   Stable, pt to continue medical treatment  lasix   Anemia No overt bleeding, for cbc with labs,  to f/u any worsening symptoms or concerns   Dizziness ? Volume related, for labs as ordered, hold lasix for 2 days and f/u in this office  Generalized weakness Exam o/w benign, for ua with labs  Followup: Return in about 3 days (around 03/07/2021).  Cathlean Cower, MD 03/09/2021 7:41 PM [facility]

## 2021-03-05 ENCOUNTER — Encounter: Payer: Self-pay | Admitting: Internal Medicine

## 2021-03-05 ENCOUNTER — Other Ambulatory Visit: Payer: Self-pay

## 2021-03-05 ENCOUNTER — Ambulatory Visit: Payer: Medicare Other | Admitting: Podiatry

## 2021-03-05 ENCOUNTER — Inpatient Hospital Stay (HOSPITAL_COMMUNITY)
Admission: EM | Admit: 2021-03-05 | Discharge: 2021-03-08 | DRG: 641 | Disposition: A | Discharge status: Home Health | Payer: BC Managed Care – PPO | Attending: Internal Medicine | Admitting: Internal Medicine

## 2021-03-05 ENCOUNTER — Encounter (HOSPITAL_COMMUNITY): Payer: Self-pay

## 2021-03-05 ENCOUNTER — Ambulatory Visit (INDEPENDENT_AMBULATORY_CARE_PROVIDER_SITE_OTHER): Payer: BC Managed Care – PPO | Admitting: Podiatry

## 2021-03-05 ENCOUNTER — Telehealth: Payer: Self-pay

## 2021-03-05 ENCOUNTER — Encounter: Payer: Self-pay | Admitting: Podiatry

## 2021-03-05 DIAGNOSIS — E876 Hypokalemia: Secondary | ICD-10-CM

## 2021-03-05 DIAGNOSIS — R5383 Other fatigue: Secondary | ICD-10-CM | POA: Diagnosis present

## 2021-03-05 DIAGNOSIS — J309 Allergic rhinitis, unspecified: Secondary | ICD-10-CM | POA: Diagnosis present

## 2021-03-05 DIAGNOSIS — I2510 Atherosclerotic heart disease of native coronary artery without angina pectoris: Secondary | ICD-10-CM | POA: Diagnosis present

## 2021-03-05 DIAGNOSIS — R1114 Bilious vomiting: Secondary | ICD-10-CM

## 2021-03-05 DIAGNOSIS — Z89422 Acquired absence of other left toe(s): Secondary | ICD-10-CM | POA: Diagnosis not present

## 2021-03-05 DIAGNOSIS — Z79891 Long term (current) use of opiate analgesic: Secondary | ICD-10-CM

## 2021-03-05 DIAGNOSIS — G40A09 Absence epileptic syndrome, not intractable, without status epilepticus: Secondary | ICD-10-CM | POA: Diagnosis present

## 2021-03-05 DIAGNOSIS — Z87891 Personal history of nicotine dependence: Secondary | ICD-10-CM | POA: Diagnosis not present

## 2021-03-05 DIAGNOSIS — Z90710 Acquired absence of both cervix and uterus: Secondary | ICD-10-CM

## 2021-03-05 DIAGNOSIS — Q0700 Arnold-Chiari syndrome without spina bifida or hydrocephalus: Secondary | ICD-10-CM

## 2021-03-05 DIAGNOSIS — E7800 Pure hypercholesterolemia, unspecified: Secondary | ICD-10-CM | POA: Diagnosis present

## 2021-03-05 DIAGNOSIS — I10 Essential (primary) hypertension: Secondary | ICD-10-CM | POA: Diagnosis present

## 2021-03-05 DIAGNOSIS — Z96653 Presence of artificial knee joint, bilateral: Secondary | ICD-10-CM | POA: Diagnosis present

## 2021-03-05 DIAGNOSIS — Z86010 Personal history of colonic polyps: Secondary | ICD-10-CM

## 2021-03-05 DIAGNOSIS — Z20822 Contact with and (suspected) exposure to covid-19: Secondary | ICD-10-CM | POA: Diagnosis present

## 2021-03-05 DIAGNOSIS — N179 Acute kidney failure, unspecified: Secondary | ICD-10-CM | POA: Diagnosis present

## 2021-03-05 DIAGNOSIS — J449 Chronic obstructive pulmonary disease, unspecified: Secondary | ICD-10-CM | POA: Diagnosis present

## 2021-03-05 DIAGNOSIS — K8020 Calculus of gallbladder without cholecystitis without obstruction: Secondary | ICD-10-CM | POA: Diagnosis present

## 2021-03-05 DIAGNOSIS — Z8249 Family history of ischemic heart disease and other diseases of the circulatory system: Secondary | ICD-10-CM

## 2021-03-05 DIAGNOSIS — M79675 Pain in left toe(s): Secondary | ICD-10-CM | POA: Diagnosis not present

## 2021-03-05 DIAGNOSIS — Z7982 Long term (current) use of aspirin: Secondary | ICD-10-CM | POA: Diagnosis not present

## 2021-03-05 DIAGNOSIS — K219 Gastro-esophageal reflux disease without esophagitis: Secondary | ICD-10-CM | POA: Diagnosis present

## 2021-03-05 DIAGNOSIS — D649 Anemia, unspecified: Secondary | ICD-10-CM | POA: Diagnosis present

## 2021-03-05 DIAGNOSIS — R7989 Other specified abnormal findings of blood chemistry: Secondary | ICD-10-CM | POA: Diagnosis present

## 2021-03-05 DIAGNOSIS — Z79899 Other long term (current) drug therapy: Secondary | ICD-10-CM

## 2021-03-05 DIAGNOSIS — G9050 Complex regional pain syndrome I, unspecified: Secondary | ICD-10-CM | POA: Diagnosis present

## 2021-03-05 DIAGNOSIS — B351 Tinea unguium: Secondary | ICD-10-CM | POA: Diagnosis not present

## 2021-03-05 DIAGNOSIS — M79674 Pain in right toe(s): Secondary | ICD-10-CM

## 2021-03-05 DIAGNOSIS — E785 Hyperlipidemia, unspecified: Secondary | ICD-10-CM | POA: Diagnosis present

## 2021-03-05 DIAGNOSIS — E873 Alkalosis: Secondary | ICD-10-CM | POA: Diagnosis present

## 2021-03-05 DIAGNOSIS — Z7951 Long term (current) use of inhaled steroids: Secondary | ICD-10-CM

## 2021-03-05 LAB — COMPREHENSIVE METABOLIC PANEL
ALT: 47 U/L — ABNORMAL HIGH (ref 0–44)
AST: 100 U/L — ABNORMAL HIGH (ref 15–41)
Albumin: 3.8 g/dL (ref 3.5–5.0)
Alkaline Phosphatase: 69 U/L (ref 38–126)
Anion gap: 13 (ref 5–15)
BUN: 16 mg/dL (ref 8–23)
CO2: 50 mmol/L — ABNORMAL HIGH (ref 22–32)
Calcium: 9.4 mg/dL (ref 8.9–10.3)
Chloride: 78 mmol/L — ABNORMAL LOW (ref 98–111)
Creatinine, Ser: 1.77 mg/dL — ABNORMAL HIGH (ref 0.44–1.00)
GFR, Estimated: 29 mL/min — ABNORMAL LOW (ref >60)
Glucose, Bld: 135 mg/dL — ABNORMAL HIGH (ref 70–99)
Potassium: <2 mmol/L — CL (ref 3.5–5.1)
Sodium: 141 mmol/L (ref 135–145)
Total Bilirubin: 0.6 mg/dL (ref 0.3–1.2)
Total Protein: 7.2 g/dL (ref 6.5–8.1)

## 2021-03-05 LAB — URINALYSIS, ROUTINE W REFLEX MICROSCOPIC
Bilirubin Urine: NEGATIVE
Ketones, ur: NEGATIVE
Leukocytes,Ua: NEGATIVE
Nitrite: NEGATIVE
Specific Gravity, Urine: 1.01 (ref 1.000–1.030)
Total Protein, Urine: 100 — AB
Urine Glucose: NEGATIVE
Urobilinogen, UA: 0.2 (ref 0.0–1.0)
pH: 6 (ref 5.0–8.0)

## 2021-03-05 LAB — CBC WITH DIFFERENTIAL/PLATELET
Abs Immature Granulocytes: 0.04 10*3/uL (ref 0.00–0.07)
Basophils Absolute: 0 10*3/uL (ref 0.0–0.1)
Basophils Relative: 1 %
Eosinophils Absolute: 0.2 10*3/uL (ref 0.0–0.5)
Eosinophils Relative: 4 %
HCT: 35.8 % — ABNORMAL LOW (ref 36.0–46.0)
Hemoglobin: 10.9 g/dL — ABNORMAL LOW (ref 12.0–15.0)
Immature Granulocytes: 1 %
Lymphocytes Relative: 26 %
Lymphs Abs: 1.1 10*3/uL (ref 0.7–4.0)
MCH: 27 pg (ref 26.0–34.0)
MCHC: 30.4 g/dL (ref 30.0–36.0)
MCV: 88.8 fL (ref 80.0–100.0)
Monocytes Absolute: 0.3 10*3/uL (ref 0.1–1.0)
Monocytes Relative: 7 %
Neutro Abs: 2.7 10*3/uL (ref 1.7–7.7)
Neutrophils Relative %: 61 %
Platelets: 216 10*3/uL (ref 150–400)
RBC: 4.03 MIL/uL (ref 3.87–5.11)
RDW: 14.6 % (ref 11.5–15.5)
WBC: 4.4 10*3/uL (ref 4.0–10.5)
nRBC: 0 % (ref 0.0–0.2)

## 2021-03-05 LAB — HEPATIC FUNCTION PANEL
ALT: 41 U/L — ABNORMAL HIGH (ref 0–35)
AST: 84 U/L — ABNORMAL HIGH (ref 0–37)
Albumin: 3.6 g/dL (ref 3.5–5.2)
Alkaline Phosphatase: 67 U/L (ref 39–117)
Bilirubin, Direct: 0.2 mg/dL (ref 0.0–0.3)
Total Bilirubin: 0.8 mg/dL (ref 0.2–1.2)
Total Protein: 6.5 g/dL (ref 6.0–8.3)

## 2021-03-05 LAB — LIPASE, BLOOD: Lipase: 27 U/L (ref 11–51)

## 2021-03-05 LAB — BASIC METABOLIC PANEL
BUN: 16 mg/dL (ref 6–23)
CO2: 49 mEq/L — ABNORMAL HIGH (ref 19–32)
Calcium: 9.5 mg/dL (ref 8.4–10.5)
Chloride: 79 mEq/L — ABNORMAL LOW (ref 96–112)
Creatinine, Ser: 1.55 mg/dL — ABNORMAL HIGH (ref 0.40–1.20)
GFR: 31.98 mL/min — ABNORMAL LOW (ref >60.00)
Glucose, Bld: 106 mg/dL — ABNORMAL HIGH (ref 70–99)
Potassium: 1.9 mEq/L — CL (ref 3.5–5.1)
Sodium: 139 mEq/L (ref 135–145)

## 2021-03-05 LAB — MAGNESIUM: Magnesium: 3.3 mg/dL — ABNORMAL HIGH (ref 1.7–2.4)

## 2021-03-05 LAB — POTASSIUM: Potassium: <2 mmol/L — CL (ref 3.5–5.1)

## 2021-03-05 LAB — PHOSPHORUS: Phosphorus: 2.3 mg/dL — ABNORMAL LOW (ref 2.5–4.6)

## 2021-03-05 LAB — SARS CORONAVIRUS 2 (TAT 6-24 HRS): SARS Coronavirus 2: NEGATIVE

## 2021-03-05 MED ORDER — POTASSIUM CHLORIDE CRYS ER 20 MEQ PO TBCR
40.0000 meq | EXTENDED_RELEASE_TABLET | Freq: Once | ORAL | Status: AC
  Administered 2021-03-06: 40 meq via ORAL
  Filled 2021-03-05: qty 2

## 2021-03-05 MED ORDER — SODIUM CHLORIDE 0.9 % IV BOLUS
1000.0000 mL | Freq: Once | INTRAVENOUS | Status: AC
  Administered 2021-03-05: 1000 mL via INTRAVENOUS

## 2021-03-05 MED ORDER — LORAZEPAM 2 MG/ML IJ SOLN: 0.5000 mg | Freq: Four times a day (QID) | INTRAMUSCULAR | Status: DC | PRN

## 2021-03-05 MED ORDER — ACETAMINOPHEN 650 MG RE SUPP: 650.0000 mg | Freq: Four times a day (QID) | RECTAL | Status: DC | PRN

## 2021-03-05 MED ORDER — SODIUM CHLORIDE 0.9 % IV SOLN: INTRAVENOUS | Status: DC

## 2021-03-05 MED ORDER — POTASSIUM CHLORIDE CRYS ER 20 MEQ PO TBCR
20.0000 meq | EXTENDED_RELEASE_TABLET | Freq: Two times a day (BID) | ORAL | Status: DC
  Administered 2021-03-05 – 2021-03-06 (×2): 20 meq via ORAL
  Filled 2021-03-05 (×3): qty 1

## 2021-03-05 MED ORDER — ACETAMINOPHEN 325 MG PO TABS: 650.0000 mg | ORAL_TABLET | Freq: Four times a day (QID) | ORAL | Status: DC | PRN

## 2021-03-05 MED ORDER — POTASSIUM CHLORIDE 10 MEQ/100ML IV SOLN
10.0000 meq | INTRAVENOUS | Status: AC
  Administered 2021-03-05 – 2021-03-06 (×6): 10 meq via INTRAVENOUS
  Filled 2021-03-05 (×6): qty 100

## 2021-03-05 MED ORDER — PANTOPRAZOLE SODIUM 40 MG PO TBEC
40.0000 mg | DELAYED_RELEASE_TABLET | Freq: Every day | ORAL | Status: DC
  Administered 2021-03-05 – 2021-03-08 (×4): 40 mg via ORAL
  Filled 2021-03-05 (×4): qty 1

## 2021-03-05 NOTE — H&P (Signed)
History and Physical    [name]:[id number] DOB: 05-Sep-1941 DOA: 03/05/2021  PCP: Biagio Borg, MD  Patient coming from: Home  Chief Complaint: fatigue  HPI: [name] is a 80 y.o. female with medical history significant of HTN, HLD, CAD, GERD. Presenting with fatigue. She reports that she's had increased fatigue for the last week. She has not changed any of her normal habits or introduced any new medications prior to that time. She does not know why she feels generally fatigued. She reports a couple of episodes of nausea during this time, but denies any fever, diarrhea, respiratory symptoms or urinary symptoms. She went to her PCP yesterday to get checked out. He drew some labs, and informed her today that her potassium was critically low. He recommended that she come to the ED for care. She denies any other aggravating or alleviating factors.    ED Course: Her potassium was found to be < 2.0. She was found to have an elevated Scr and LFTs. She was started on K+ and fluids. TRH was called for admission.   Review of Systems:  Denies CP, palpitations, dyspnea, D, fever, syncopal episodes. Review of systems is otherwise negative for all not mentioned in HPI.   PMHx Past Medical History:  Diagnosis Date  . Allergic rhinitis   . Arnold-Chiari malformation ([place])   . Asthma   . CAD (coronary artery disease)   . COPD (chronic obstructive pulmonary disease) ([place])   . Coughing    coughing since last 01-22-2019 , started on cefdinir bid x10 days, has 3 pills left today , reports she feels mcuh better , still coughing copiuus amonts of thick white sputum , denies fever nor chills, nor body aches .   Marland Kitchen DJD (degenerative joint disease)   . GERD (gastroesophageal reflux disease)   . History of absence seizures    last confirmed seizure age 86    . Hx of colonic polyps   . Hypercholesteremia   . Hypertension   . Obesity   . Positive PPD   . Reflex sympathetic dystrophy   . Varicose  veins   . Venous insufficiency     PSHx Past Surgical History:  Procedure Laterality Date  . ABDOMINAL HYSTERECTOMY    . cspine surgery  1993   for arnold-chiari malformation  . JOINT REPLACEMENT  06/05/11   Left total knee replacement  . right knee arthroscopy  12/2007   Dr. Tonita Cong  . right total knee replacement  05/2008   Dr. Tonita Cong  . TOTAL KNEE REVISION Left 02/03/2019   Procedure: LEFT TOTAL KNEE REVISION;  Surgeon: Rod Can, MD;  Location: [facility];  Service: Orthopedics;  Laterality: Left;    SocHx  reports that she quit smoking about 9 years ago. Her smoking use included cigarettes. She has a 3.60 pack-year smoking history. She has never used smokeless tobacco. She reports that she does not drink alcohol and does not use drugs.  No Known Allergies  FamHx Family History  Problem Relation Age of Onset  . Heart disease Mother   . Stroke Mother   . Stroke Father   . Clotting disorder Father   . Colon cancer Neg Hx   . Stomach cancer Neg Hx     Prior to Admission medications   Medication Sig Start Date End Date Taking? Authorizing Provider  albuterol (PROVENTIL HFA;VENTOLIN HFA) 108 (90 BASE) MCG/ACT inhaler Inhale 2 puffs into the lungs every 4 (four) hours as needed. For wheeze  or shortness of breath Patient taking differently: Inhale 2 puffs into the lungs every 4 (four) hours as needed for wheezing or shortness of breath. 03/26/12   Noralee Space, MD  alendronate (FOSAMAX) 70 MG tablet TAKE 1 TABLET (70 MG TOTAL) BY MOUTH EVERY 7 (SEVEN) DAYS. TAKE WITH A FULL GLASS OF WATER ON AN EMPTY STOMACH. 12/07/19   Biagio Borg, MD  aspirin 81 MG tablet Take 81 mg by mouth daily.    [provider]  azelastine (ASTELIN) 0.1 % nasal spray Place 1 spray into both nostrils daily as needed for rhinitis or allergies.  11/07/18   [provider]  budesonide-formoterol (SYMBICORT) 160-4.5 MCG/ACT inhaler Inhale 2 puffs into the lungs 2 (two) times daily. 06/19/16  11/07/19  Noralee Space, MD  Cholecalciferol (THERA-D 2000) 50 MCG (2000 UT) TABS 1 tab by mouth once daily 01/09/21   Biagio Borg, MD  CVS D3 50 MCG (2000 UT) CAPS Take 1 capsule by mouth daily. 01/09/21   [provider]  docusate sodium (COLACE) 100 MG capsule Take 1 capsule (100 mg total) by mouth 2 (two) times daily. 02/04/19   Swinteck, Aaron Edelman, MD  doxycycline (VIBRA-TABS) 100 MG tablet Take 1 tablet (100 mg total) by mouth 2 (two) times daily. 12/13/20   Biagio Borg, MD  EPINEPHrine 0.3 mg/0.3 mL IJ SOAJ injection epinephrine 0.3 mg/0.3 mL injection, auto-injector    [provider]  famotidine (PEPCID) 20 MG tablet Take 20 mg by mouth 2 (two) times daily. 01/07/21   [provider]  ferrous sulfate 325 (65 FE) MG EC tablet Take 325 mg by mouth daily.    [provider]  fluticasone (FLONASE) 50 MCG/ACT nasal spray Place 1 spray into both nostrils 2 (two) times daily as needed for allergies or rhinitis.    [provider]  furosemide (LASIX) 40 MG tablet TAKE 2 TABLETS (80 MG TOTAL) BY MOUTH DAILY. OFFICE VISIT IS DUE FOR REFILLS 11/19/20   Biagio Borg, MD  HYDROcodone-acetaminophen (NORCO/VICODIN) 5-325 MG tablet     [provider]  KLOR-CON M20 20 MEQ tablet TAKE 1 TABLET BY MOUTH EVERY DAY 03/26/20   Biagio Borg, MD  meloxicam (MOBIC) 15 MG tablet TAKE 1 TABLET BY MOUTH EVERY DAY AS NEEDED FOR PAIN 09/26/20   Biagio Borg, MD  Menthol 5 % [facility] Apply 1 patch topically daily as needed (pain).    [provider]  montelukast (SINGULAIR) 10 MG tablet Take 10 mg by mouth every evening.    [provider]  omeprazole (PRILOSEC) 20 MG capsule Take 1 capsule by mouth daily. 01/17/21   [provider]  omeprazole (PRILOSEC) 40 MG capsule TAKE 1 CAPSULE BY MOUTH EVERY DAY 01/03/19   Biagio Borg, MD  oxyCODONE-acetaminophen (PERCOCET) 10-325 MG tablet Take 1 tablet by mouth 3 (three) times daily as needed. 02/05/21    [provider]  pantoprazole (PROTONIX) 40 MG tablet Take 1 tablet (40 mg total) by mouth daily. 01/09/21   Biagio Borg, MD  promethazine-codeine (PHENERGAN WITH CODEINE) 6.25-10 MG/5ML syrup promethazine 6.25 mg-codeine 10 mg/5 mL syrup    [provider]  rosuvastatin (CRESTOR) 40 MG tablet Take 1 tablet (40 mg total) by mouth daily. 12/24/20 03/24/21  Lelon Perla, MD  senna (SENOKOT) 8.6 MG TABS tablet Take 2 tablets (17.2 mg total) by mouth at bedtime. 02/04/19   Swinteck, Aaron Edelman, MD  Sodium Chloride-Sodium Bicarb (SINUS [place] SQUEEZE BOTTLE)  2300-700 MG KIT See admin instructions.    [provider]  Tiotropium Bromide Monohydrate (SPIRIVA RESPIMAT) 1.25 MCG/ACT AERS Spiriva Respimat 1.25 mcg/actuation solution for inhalation  INHALE 2 PUFFS ONCE A DAY    [provider]  tiZANidine (ZANAFLEX) 2 MG tablet TAKE 1 TABLET BY MOUTH EVERY 6 HOURS AS NEEDED FOR MUSCLE SPASMS. 12/27/20   Biagio Borg, MD  traMADol (ULTRAM) 50 MG tablet Take 1 tablet (50 mg total) by mouth every 6 (six) hours as needed. 08/19/19   Biagio Borg, MD  traZODone (DESYREL) 100 MG tablet TAKE 1 TABLET BY MOUTH EVERYDAY AT BEDTIME 12/06/19   Biagio Borg, MD    Physical Exam: Vitals:   03/05/21 1415 03/05/21 1430 03/05/21 1517 03/05/21 1600  BP: 112/66 100/79 105/64 111/61  Pulse: (!) 53 (!) 55 (!) 55 (!) 51  Resp: _0 Temp:      TempSrc:      SpO2: 95% 98% 99% 100%  Weight:      Height:        General: 80 y.o. female resting in bed in NAD Eyes: PERRL, normal sclera ENMT: Nares patent w/o discharge, orophaynx clear, dentition normal, ears w/o discharge/lesions/ulcers Neck: Supple, trachea midline Cardiovascular: RRR, +S1, S2, no g/r, 1/6 SEM, equal pulses throughout Respiratory: CTABL, no w/r/r, normal WOB GI: BS+, NDNT, no masses noted, no organomegaly noted MSK: No e/c/c Skin: No rashes, bruises, ulcerations noted Neuro: A&O x 3, no focal deficits Psyc:  Appropriate interaction and affect, calm/cooperative  Labs on Admission: I have personally reviewed following labs and imaging studies  CBC: Recent Labs  Lab 03/04/21 1602 03/05/21 1409  WBC 5.0 4.4  NEUTROABS 3.0 2.7  HGB 11.4* 10.9*  HCT 34.8* 35.8*  MCV 82.6 88.8  PLT 220.0 832   Basic Metabolic Panel: Recent Labs  Lab 03/04/21 1602 03/05/21 1409  NA 139 141  K 1.9 repeated * <2.0*  CL 79* 78*  CO2 49* 50*  GLUCOSE 106* 135*  BUN 16 16  CREATININE 1.55* 1.77*  CALCIUM 9.5 9.4  MG  --  3.3*   GFR: Estimated Creatinine Clearance: 28.7 mL/min (A) (by C-G formula based on SCr of 1.77 mg/dL (H)). Liver Function Tests: Recent Labs  Lab 03/04/21 1602 03/05/21 1409  AST 84* 100*  ALT 41* 47*  ALKPHOS 67 69  BILITOT 0.8 0.6  PROT 6.5 7.2  ALBUMIN 3.6 3.8   Recent Labs  Lab 03/05/21 1409  LIPASE 27   No results for input(s): AMMONIA in the last 168 hours. Coagulation Profile: No results for input(s): INR, PROTIME in the last 168 hours. Cardiac Enzymes: No results for input(s): CKTOTAL, CKMB, CKMBINDEX, TROPONINI in the last 168 hours. BNP (last 3 results) No results for input(s): PROBNP in the last 8760 hours. HbA1C: No results for input(s): HGBA1C in the last 72 hours. CBG: No results for input(s): GLUCAP in the last 168 hours. Lipid Profile: No results for input(s): CHOL, HDL, LDLCALC, TRIG, CHOLHDL, LDLDIRECT in the last 72 hours. Thyroid Function Tests: Recent Labs    03/04/21 1602  TSH 0.88   Anemia Panel: Recent Labs    03/04/21 1602  FERRITIN 504.1*  IRON 63   Urine analysis:    Component Value Date/Time   COLORURINE YELLOW 03/04/2021 1602   APPEARANCEUR [place] (A) 03/04/2021 1602   LABSPEC 1.010 03/04/2021 1602   PHURINE 6.0 03/04/2021 1602   GLUCOSEU NEGATIVE 03/04/2021 1602   HGBUR LARGE (A) 03/04/2021  [place] 03/04/2021 [place] 03/04/2021 1602   PROTEINUR NEGATIVE 05/28/2011 1020    UROBILINOGEN 0.2 03/04/2021 1602   NITRITE NEGATIVE 03/04/2021 1602   LEUKOCYTESUR NEGATIVE 03/04/2021 1602    Radiological Exams on Admission: No results found.  EKG: Independently reviewed. Sinus, prolonged QT, no st elevations  Assessment/Plan Hypokalemia     - admit to inpt, tele     - IV K+ started in ED, continue; add PO K+     - hold her lasix for now     - Mg2+ is ok, check phos     - follow up K+ after IV runs  AKI     - baseline SCr is ~0.7, she's 1.77 today     - hold lasix, watch nephrotoxins     - fluids     - check [place] ab complete  Metabolic alkalosis N/V     - she only endorses a couple episodes of nausea/vomiting. Question if she is downplaying this     - spoke with nephro; fluids for now     - prolonged Qtc on EKG; given low K+, will be extra cautious w/ anti-emetics, will use ativan for now  Normocytic anemia     - no evidence of bleed  HTN     - normotensive; resume regimen once med rec complete  CAD     - continue ASA, hold statin d/t elevated LFTs  Elevated LFTs     - check hepatitis panel, [place] ab complete  GERD     - protonix  DVT prophylaxis: SCDs  Code Status: FULL  Family Communication: w/ sister at bedside  Consults called: sidebarred nephro   Status is: Inpatient  Remains inpatient appropriate because:Persistent severe electrolyte disturbances   Dispo: The patient is from: Home              Anticipated d/c is to: Home              Patient currently is not medically stable to d/c.   Difficult to place patient No  Jonnie Finner DO [facility]  If 7PM-7AM, please contact night-coverage [email or web address]  03/05/2021, 4:40 PM

## 2021-03-05 NOTE — Telephone Encounter (Signed)
CRITICAL VALUE STICKER  CRITICAL VALUE: Potassium 1.9, Chloride 79  RECEIVER (on-site recipient of call): Hope  DATE & TIME NOTIFIED: 03/05/21 at 1219  MESSENGER (representative from lab): Hope MD NOTIFIED:  Dr Jenny Reichmann  TIME OF NOTIFICATION: 1219  RESPONSE: Instruct patient to go to ED now.

## 2021-03-05 NOTE — ED Triage Notes (Signed)
Pt sent from PCP for low K+ level, states blood was drawn yesterday. Pt c/o weakness x 1 week and states she had 1 episode of vomit 2 days ago

## 2021-03-05 NOTE — Telephone Encounter (Signed)
Pt notified that Potassium is harmfully low & that PCP wants her to go straight to the emergency room.  Pt verb understanding.

## 2021-03-05 NOTE — ED Triage Notes (Signed)
Emergency Medicine Provider Triage Evaluation Note  [name] , a 80 y.o. female  was evaluated in triage.  Pt complains of dizziness.  Her potassium was significantly low yesterday with her PCP at 1.9.  She reports reports compliance with her potassium pills.  She did have one episode of vomiting 2 days ago.  She has been near syncopal however has not had a syncopal event.    Physical Exam  BP 112/62 (BP Location: Left Arm)   Pulse (!) 51   Temp 99.3 F (37.4 C) (Oral)   Resp 18   Ht 5\' 4"  (1.626 m)   Wt 91.6 kg   SpO2 100%   BMI 34.67 kg/m  Patient is awake and alert, answers questions appropriately.  Patient is in no obvious distress.  Speech is nonslurred.  Respirations are even and unlabored.  Medical Decision Making  Medically screening exam initiated at 1:43 PM.  Appropriate orders placed.  HOANG REICH was informed that the remainder of the evaluation will be completed by another provider, this initial triage assessment does not replace that evaluation, and the importance of remaining in the ED until their evaluation is complete.     Lorin Glass, [place] 03/05/21 1347

## 2021-03-05 NOTE — ED Provider Notes (Signed)
[facility] DEPT Provider Note   CSN: [id number] Arrival date & time: 03/05/21  1256     History Chief Complaint  Patient presents with  . abnormal labs    [name] is a 80 y.o. female.  HPI Patient presents concern of weakness, near syncope, dizziness. Patient notes that she went episode of vomiting 2 days ago.  She takes potassium repletion regularly.  With worsening dizziness/weakness she had labs performed yesterday, and values were notable for critically low value of 1.9.  Currently no pain, no dyspnea, no nausea, no vomiting.    Past Medical History:  Diagnosis Date  . Allergic rhinitis   . Arnold-Chiari malformation ([place])   . Asthma   . CAD (coronary artery disease)   . COPD (chronic obstructive pulmonary disease) ([place])   . Coughing    coughing since last 01-22-2019 , started on cefdinir bid x10 days, has 3 pills left today , reports she feels mcuh better , still coughing copiuus amonts of thick white sputum , denies fever nor chills, nor body aches .   Marland Kitchen DJD (degenerative joint disease)   . GERD (gastroesophageal reflux disease)   . History of absence seizures    last confirmed seizure age 39    . Hx of colonic polyps   . Hypercholesteremia   . Hypertension   . Obesity   . Positive PPD   . Reflex sympathetic dystrophy   . Varicose veins   . Venous insufficiency     Patient Active Problem List   Diagnosis Date Noted  . Generalized weakness 03/04/2021  . Dizziness 03/04/2021  . Vitamin D deficiency 01/16/2021  . Cough 12/13/2020  . Elevated blood-pressure reading, without diagnosis of hypertension 12/11/2020  . 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  . Allergic rhinitis due to animal (cat) (dog) hair and dander 12/04/2020  . Allergic rhinitis due to pollen 12/04/2020  . Moderate persistent asthma, uncomplicated 11/57/2620  . Lumbar radiculopathy 11/19/2020  . Spinal stenosis of lumbar region 11/19/2020  . Leg cramping 11/06/2020  .  Degeneration of lumbar intervertebral disc 09/27/2020  . Degenerative spondylolisthesis 08/15/2020  . Pes anserinus bursitis of right knee 05/07/2020  . Ulcer of toe, left, limited to breakdown of skin ([place]) 08/31/2019  . Diastolic CHF, chronic ([place]) 08/31/2019  . PVD (peripheral vascular disease) ([place]) 08/31/2019  . Bilateral carpal tunnel syndrome 08/19/2019  . Posterior neck pain 08/19/2019  . Vertigo 04/08/2019  . Anemia 04/08/2019  . Stiffness of left knee 02/10/2019  . Failed total knee, left ([place]) 02/03/2019  . Failed total knee, left, initial encounter ([place]) 02/03/2019  . Carpal tunnel syndrome of left wrist 01/21/2019  . Pain of left hand 01/21/2019  . Preop exam for internal medicine 11/02/2018  . Left hand paresthesia 11/02/2018  . 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 Hyperopia with presbyopia, bilateral 06/29/2018  . Abdominal pain, lower 03/02/2018  . Diarrhea 03/02/2018  . Encounter for well adult exam with abnormal findings 11/14/2017  . Arnold-Chiari malformation ([place])   . Asthma   . CAD (coronary artery disease)   . COPD (chronic obstructive pulmonary disease) ([place])   . DJD (degenerative joint disease)   . History of absence seizures   . Hx of colonic polyps   . Hypercholesteremia   . Positive PPD   . Venous insufficiency   . Pain in left lower leg 07/20/2017  . Cellulitis of left lower extremity 06/18/2017  . Bilateral lower extremity edema 04/30/2017  .  Shingles 02/26/2017  . COPD exacerbation ([place]) 01/07/2017  . S/P TKR (total knee replacement), bilateral 01/07/2017  . Right arm pain 05/16/2015  . Generalized anxiety disorder 08/31/2014  . Varicose veins of lower extremities with other complications 16/05/3709  . Edema 08/07/2011  . MENORRHAGIA, POSTMENOPAUSAL 08/13/2010  . CIGARETTE SMOKER 06/11/2010  . CARDIAC MURMUR 11/02/2009  . BRADYCARDIA 11/01/2009  . CHEST PAIN 11/01/2009  . Allergic rhinitis 04/19/2009  .  Chronic obstructive airway disease with asthma ([place]) 04/18/2009  . LEG CRAMPS, NOCTURNAL 11/02/2008  . Essential hypertension 01/17/2008  . COLONIC POLYPS 01/14/2008  . Obesity 01/14/2008  . Reflex sympathetic dystrophy 01/14/2008  . Coronary atherosclerosis 01/14/2008  . GERD 01/14/2008  . Osteoarthritis 01/14/2008  . BACK PAIN, LUMBAR 01/14/2008  . SEIZURES, HX OF 01/14/2008    Past Surgical History:  Procedure Laterality Date  . ABDOMINAL HYSTERECTOMY    . cspine surgery  1993   for arnold-chiari malformation  . JOINT REPLACEMENT  06/05/11   Left total knee replacement  . right knee arthroscopy  12/2007   Dr. Tonita Cong  . right total knee replacement  05/2008   Dr. Tonita Cong  . TOTAL KNEE REVISION Left 02/03/2019   Procedure: LEFT TOTAL KNEE REVISION;  Surgeon: Rod Can, MD;  Location: [facility] ORS;  Service: Orthopedics;  Laterality: Left;     OB History   No obstetric history on file.     Family History  Problem Relation Age of Onset  . Heart disease Mother   . Stroke Mother   . Stroke Father   . Clotting disorder Father   . Colon cancer Neg Hx   . Stomach cancer Neg Hx     Social History   Tobacco Use  . Smoking status: Former Smoker    Packs/day: 0.30    Years: 12.00    Pack years: 3.60    Types: Cigarettes    Quit date: 01/02/2012    Years since quitting: 9.1  . Smokeless tobacco: Never Used  . Tobacco comment: 1 pack per week  Vaping Use  . Vaping Use: Never used  Substance Use Topics  . Alcohol use: No  . Drug use: No    Home Medications Prior to Admission medications   Medication Sig Start Date End Date Taking? Authorizing Provider  albuterol (PROVENTIL HFA;VENTOLIN HFA) 108 (90 BASE) MCG/ACT inhaler Inhale 2 puffs into the lungs every 4 (four) hours as needed. For wheeze or shortness of breath Patient taking differently: Inhale 2 puffs into the lungs every 4 (four) hours as needed for wheezing or shortness of breath. 03/26/12   Noralee Space, MD   alendronate (FOSAMAX) 70 MG tablet TAKE 1 TABLET (70 MG TOTAL) BY MOUTH EVERY 7 (SEVEN) DAYS. TAKE WITH A FULL GLASS OF WATER ON AN EMPTY STOMACH. 12/07/19   Biagio Borg, MD  aspirin 81 MG tablet Take 81 mg by mouth daily.    [provider]  azelastine (ASTELIN) 0.1 % nasal spray Place 1 spray into both nostrils daily as needed for rhinitis or allergies.  11/07/18   [provider]  budesonide-formoterol (SYMBICORT) 160-4.5 MCG/ACT inhaler Inhale 2 puffs into the lungs 2 (two) times daily. 06/19/16 11/07/19  Noralee Space, MD  Cholecalciferol (THERA-D 2000) 50 MCG (2000 UT) TABS 1 tab by mouth once daily 01/09/21   Biagio Borg, MD  [employer] D3 50 MCG (2000 UT) CAPS Take 1 capsule by mouth daily. 01/09/21   [provider]  docusate sodium (COLACE) 100  MG capsule Take 1 capsule (100 mg total) by mouth 2 (two) times daily. 02/04/19   Swinteck, Aaron Edelman, MD  doxycycline (VIBRA-TABS) 100 MG tablet Take 1 tablet (100 mg total) by mouth 2 (two) times daily. 12/13/20   Biagio Borg, MD  EPINEPHrine 0.3 mg/0.3 mL IJ SOAJ injection epinephrine 0.3 mg/0.3 mL injection, auto-injector    [provider]  famotidine (PEPCID) 20 MG tablet Take 20 mg by mouth 2 (two) times daily. 01/07/21   [provider]  ferrous sulfate 325 (65 FE) MG EC tablet Take 325 mg by mouth daily.    [provider]  fluticasone (FLONASE) 50 MCG/ACT nasal spray Place 1 spray into both nostrils 2 (two) times daily as needed for allergies or rhinitis.    [provider]  furosemide (LASIX) 40 MG tablet TAKE 2 TABLETS (80 MG TOTAL) BY MOUTH DAILY. OFFICE VISIT IS DUE FOR REFILLS 11/19/20   Biagio Borg, MD  HYDROcodone-acetaminophen (NORCO/VICODIN) 5-325 MG tablet     [provider]  KLOR-CON M20 20 MEQ tablet TAKE 1 TABLET BY MOUTH EVERY DAY 03/26/20   Biagio Borg, MD  meloxicam (MOBIC) 15 MG tablet TAKE 1 TABLET BY MOUTH EVERY DAY AS NEEDED FOR PAIN 09/26/20   Biagio Borg, MD   Menthol 5 % [facility] Apply 1 patch topically daily as needed (pain).    [provider]  montelukast (SINGULAIR) 10 MG tablet Take 10 mg by mouth every evening.    [provider]  omeprazole (PRILOSEC) 20 MG capsule Take 1 capsule by mouth daily. 01/17/21   [provider]  omeprazole (PRILOSEC) 40 MG capsule TAKE 1 CAPSULE BY MOUTH EVERY DAY 01/03/19   Biagio Borg, MD  oxyCODONE-acetaminophen (PERCOCET) 10-325 MG tablet Take 1 tablet by mouth 3 (three) times daily as needed. 02/05/21   [provider]  pantoprazole (PROTONIX) 40 MG tablet Take 1 tablet (40 mg total) by mouth daily. 01/09/21   Biagio Borg, MD  promethazine-codeine (PHENERGAN WITH CODEINE) 6.25-10 MG/5ML syrup promethazine 6.25 mg-codeine 10 mg/5 mL syrup    [provider]  rosuvastatin (CRESTOR) 40 MG tablet Take 1 tablet (40 mg total) by mouth daily. 12/24/20 03/24/21  Lelon Perla, MD  senna (SENOKOT) 8.6 MG TABS tablet Take 2 tablets (17.2 mg total) by mouth at bedtime. 02/04/19   Swinteck, Aaron Edelman, MD  Sodium Chloride-Sodium Bicarb (SINUS [employer] SQUEEZE BOTTLE) 2300-700 MG KIT See admin instructions.    [provider]  Tiotropium Bromide Monohydrate (SPIRIVA RESPIMAT) 1.25 MCG/ACT AERS Spiriva Respimat 1.25 mcg/actuation solution for inhalation  INHALE 2 PUFFS ONCE A DAY    [provider]  tiZANidine (ZANAFLEX) 2 MG tablet TAKE 1 TABLET BY MOUTH EVERY 6 HOURS AS NEEDED FOR MUSCLE SPASMS. 12/27/20   Biagio Borg, MD  traMADol (ULTRAM) 50 MG tablet Take 1 tablet (50 mg total) by mouth every 6 (six) hours as needed. 08/19/19   Biagio Borg, MD  traZODone (DESYREL) 100 MG tablet TAKE 1 TABLET BY MOUTH EVERYDAY AT BEDTIME 12/06/19   Biagio Borg, MD    Allergies    Patient has no known allergies.  Review of Systems   Review of Systems  Constitutional:       Per HPI, otherwise negative  HENT:       Per HPI, otherwise negative  Respiratory:       Per HPI, otherwise  negative  Cardiovascular:       Per HPI,  otherwise negative  Gastrointestinal: Positive for nausea and vomiting. Negative for abdominal pain.  Endocrine:       Negative aside from HPI  Genitourinary:       Neg aside from HPI   Musculoskeletal:       Per HPI, otherwise negative  Skin: Negative.   Neurological: Positive for dizziness, weakness and light-headedness. Negative for syncope.    Physical Exam Updated Vital Signs BP 105/64 (BP Location: Left Arm)   Pulse (!) 55   Temp 99.3 F (37.4 C) (Oral)   Resp 14   Ht _0  (1.626 m)   Wt 91.6 kg   SpO2 99%   BMI 34.67 kg/m   Physical Exam Vitals and nursing note reviewed.  Constitutional:      General: She is not in acute distress.    Appearance: She is well-developed. She is obese.  HENT:     Head: Normocephalic and atraumatic.  Eyes:     Conjunctiva/sclera: Conjunctivae normal.  Cardiovascular:     Rate and Rhythm: Normal rate and regular rhythm.  Pulmonary:     Effort: Pulmonary effort is normal. No respiratory distress.     Breath sounds: Normal breath sounds. No stridor.  Abdominal:     General: There is no distension.  Skin:    General: Skin is warm and dry.  Neurological:     Mental Status: She is alert and oriented to person, place, and time.     Cranial Nerves: No cranial nerve deficit.      ED Results / Procedures / Treatments   Labs (all labs ordered are listed, but only abnormal results are displayed) Labs Reviewed  COMPREHENSIVE METABOLIC PANEL - Abnormal; Notable for the following components:      Result Value   Potassium <2.0 (*)    Chloride 78 (*)    CO2 50 (*)    Glucose, Bld 135 (*)    Creatinine, Ser 1.77 (*)    AST 100 (*)    ALT 47 (*)    GFR, Estimated 29 (*)    All other components within normal limits  MAGNESIUM - Abnormal; Notable for the following components:   Magnesium 3.3 (*)    All other components within normal limits  CBC WITH DIFFERENTIAL/PLATELET - Abnormal; Notable  for the following components:   Hemoglobin 10.9 (*)    HCT 35.8 (*)    All other components within normal limits  SARS CORONAVIRUS 2 (TAT 6-24 HRS)  LIPASE, BLOOD    EKG EKG Interpretation  Date/Time:  Tuesday March 05 2021 14:10:50 EDT Ventricular Rate:  55 PR Interval:  188 QRS Duration: 112 QT Interval:  584 QTC Calculation: 559 R Axis:   -11 Text Interpretation: Sinus rhythm LVH with IVCD and secondary repol abnrm Prolonged QT interval anterior st-t wave changes new since last tracing Confirmed by Dorie Rank [phone]) on 03/05/2021 2:17:55 PM   Radiology No results found.  Procedures Procedures   Medications Ordered in ED Medications - No data to display  ED Course  I have reviewed the triage vital signs and the nursing notes.  Pertinent labs & imaging results that were available during my care of the patient were reviewed by me and considered in my medical decision making (see chart for details).   Update:, Patient found to have critically abnormal potassium value.  In addition, patient is found to have creatinine 1.7, substantially up from baseline 0.8. She continues to deny complaints beyond weakness.  She has had no  vomiting in several days.  Given her dependency on oral potassium, and her inability to tolerate this during her nausea, vomiting episodes, some suspicion for accommodation of volume loss, p.o. intolerance and vomiting contributing to today's abnormalities. With a soft, nonperitoneal abdomen, no CT indicated.  Patient has begun receive IV potassium repletion, fluid resuscitation for her acute kidney injury.  Given these abnormalities, the patient will require admission for further monitoring, management.  MDM Rules/Calculators/A&P   MDM Number of Diagnoses or Management Options Bilious vomiting with nausea: new, needed workup Hypokalemia: established, worsening   Amount and/or Complexity of Data Reviewed Clinical lab tests: reviewed and ordered Tests in  the medicine section of CPT: reviewed and ordered Decide to obtain previous medical records or to obtain history from someone other than the patient: yes Review and summarize past medical records: yes Discuss the patient with other providers: yes Independent visualization of images, tracings, or specimens: yes  Risk of Complications, Morbidity, and/or Mortality Presenting problems: high Diagnostic procedures: high Management options: high  Critical Care Total time providing critical care: 30-74 minutes (35)  Patient Progress Patient progress: stable  Final Clinical Impression(s) / ED Diagnoses Final diagnoses:  Bilious vomiting with nausea  Hypokalemia     Carmin Muskrat, MD 03/05/21 1731

## 2021-03-06 ENCOUNTER — Inpatient Hospital Stay (HOSPITAL_COMMUNITY): Payer: BC Managed Care – PPO

## 2021-03-06 DIAGNOSIS — R7989 Other specified abnormal findings of blood chemistry: Secondary | ICD-10-CM

## 2021-03-06 DIAGNOSIS — R1114 Bilious vomiting: Secondary | ICD-10-CM

## 2021-03-06 LAB — COMPREHENSIVE METABOLIC PANEL
ALT: 34 U/L (ref 0–44)
AST: 62 U/L — ABNORMAL HIGH (ref 15–41)
Albumin: 2.8 g/dL — ABNORMAL LOW (ref 3.5–5.0)
Alkaline Phosphatase: 54 U/L (ref 38–126)
Anion gap: 13 (ref 5–15)
BUN: 14 mg/dL (ref 8–23)
CO2: 40 mmol/L — ABNORMAL HIGH (ref 22–32)
Calcium: 8.4 mg/dL — ABNORMAL LOW (ref 8.9–10.3)
Chloride: 89 mmol/L — ABNORMAL LOW (ref 98–111)
Creatinine, Ser: 1.23 mg/dL — ABNORMAL HIGH (ref 0.44–1.00)
GFR, Estimated: 45 mL/min — ABNORMAL LOW (ref >60)
Glucose, Bld: 104 mg/dL — ABNORMAL HIGH (ref 70–99)
Potassium: <2 mmol/L — CL (ref 3.5–5.1)
Sodium: 142 mmol/L (ref 135–145)
Total Bilirubin: 0.8 mg/dL (ref 0.3–1.2)
Total Protein: 5.7 g/dL — ABNORMAL LOW (ref 6.5–8.1)

## 2021-03-06 LAB — CBC
HCT: 33 % — ABNORMAL LOW (ref 36.0–46.0)
Hemoglobin: 9.9 g/dL — ABNORMAL LOW (ref 12.0–15.0)
MCH: 27 pg (ref 26.0–34.0)
MCHC: 30 g/dL (ref 30.0–36.0)
MCV: 89.9 fL (ref 80.0–100.0)
Platelets: 175 10*3/uL (ref 150–400)
RBC: 3.67 MIL/uL — ABNORMAL LOW (ref 3.87–5.11)
RDW: 14.6 % (ref 11.5–15.5)
WBC: 3.6 10*3/uL — ABNORMAL LOW (ref 4.0–10.5)
nRBC: 0 % (ref 0.0–0.2)

## 2021-03-06 LAB — POTASSIUM
Potassium: 2.4 mmol/L — CL (ref 3.5–5.1)
Potassium: 2.5 mmol/L — CL (ref 3.5–5.1)
Potassium: 2.6 mmol/L — CL (ref 3.5–5.1)
Potassium: 3.5 mmol/L (ref 3.5–5.1)

## 2021-03-06 LAB — HEPATITIS PANEL, ACUTE
HCV Ab: NONREACTIVE
Hep A IgM: NONREACTIVE
Hep B C IgM: NONREACTIVE
Hepatitis B Surface Ag: NONREACTIVE

## 2021-03-06 MED ORDER — POTASSIUM CHLORIDE CRYS ER 20 MEQ PO TBCR
60.0000 meq | EXTENDED_RELEASE_TABLET | Freq: Once | ORAL | Status: AC
  Administered 2021-03-06: 60 meq via ORAL
  Filled 2021-03-06: qty 3

## 2021-03-06 MED ORDER — ROSUVASTATIN CALCIUM 20 MG PO TABS
40.0000 mg | ORAL_TABLET | Freq: Every day | ORAL | Status: DC
  Administered 2021-03-06 – 2021-03-08 (×3): 40 mg via ORAL
  Filled 2021-03-06 (×3): qty 2

## 2021-03-06 MED ORDER — OXYCODONE HCL 5 MG PO TABS: 5.0000 mg | ORAL_TABLET | Freq: Three times a day (TID) | ORAL | Status: DC | PRN

## 2021-03-06 MED ORDER — OXYCODONE-ACETAMINOPHEN 5-325 MG PO TABS: 1.0000 | ORAL_TABLET | Freq: Three times a day (TID) | ORAL | Status: DC | PRN

## 2021-03-06 MED ORDER — FAMOTIDINE 20 MG PO TABS
20.0000 mg | ORAL_TABLET | Freq: Two times a day (BID) | ORAL | Status: DC
  Administered 2021-03-06 – 2021-03-08 (×5): 20 mg via ORAL
  Filled 2021-03-06 (×5): qty 1

## 2021-03-06 MED ORDER — PANTOPRAZOLE SODIUM 40 MG PO TBEC: 40.0000 mg | DELAYED_RELEASE_TABLET | Freq: Every day | ORAL | Status: DC

## 2021-03-06 MED ORDER — OXYCODONE-ACETAMINOPHEN 10-325 MG PO TABS: 1.0000 | ORAL_TABLET | Freq: Three times a day (TID) | ORAL | Status: DC | PRN

## 2021-03-06 MED ORDER — POTASSIUM CHLORIDE CRYS ER 20 MEQ PO TBCR
40.0000 meq | EXTENDED_RELEASE_TABLET | Freq: Once | ORAL | Status: AC
  Administered 2021-03-06: 40 meq via ORAL
  Filled 2021-03-06: qty 2

## 2021-03-06 MED ORDER — POTASSIUM CHLORIDE CRYS ER 20 MEQ PO TBCR: 20.0000 meq | EXTENDED_RELEASE_TABLET | Freq: Two times a day (BID) | ORAL | Status: DC

## 2021-03-06 MED ORDER — POTASSIUM CHLORIDE 10 MEQ/100ML IV SOLN
10.0000 meq | INTRAVENOUS | Status: AC
  Administered 2021-03-06 (×5): 10 meq via INTRAVENOUS
  Filled 2021-03-06 (×5): qty 100

## 2021-03-06 MED ORDER — POTASSIUM CHLORIDE CRYS ER 20 MEQ PO TBCR
60.0000 meq | EXTENDED_RELEASE_TABLET | Freq: Two times a day (BID) | ORAL | Status: AC
  Administered 2021-03-06 (×2): 60 meq via ORAL
  Filled 2021-03-06 (×2): qty 3

## 2021-03-06 MED ORDER — POTASSIUM CHLORIDE 10 MEQ/100ML IV SOLN
10.0000 meq | INTRAVENOUS | Status: AC
  Administered 2021-03-06 (×2): 10 meq via INTRAVENOUS
  Filled 2021-03-06 (×2): qty 100

## 2021-03-06 MED ORDER — FERROUS SULFATE 325 (65 FE) MG PO TABS
325.0000 mg | ORAL_TABLET | Freq: Every day | ORAL | Status: DC
  Administered 2021-03-06 – 2021-03-08 (×3): 325 mg via ORAL
  Filled 2021-03-06 (×3): qty 1

## 2021-03-06 MED ORDER — ASPIRIN EC 81 MG PO TBEC
81.0000 mg | DELAYED_RELEASE_TABLET | Freq: Every day | ORAL | Status: DC
  Administered 2021-03-06 – 2021-03-08 (×3): 81 mg via ORAL
  Filled 2021-03-06 (×3): qty 1

## 2021-03-06 MED ORDER — POTASSIUM CHLORIDE CRYS ER 20 MEQ PO TBCR
20.0000 meq | EXTENDED_RELEASE_TABLET | Freq: Two times a day (BID) | ORAL | Status: DC
  Administered 2021-03-06: 20 meq via ORAL
  Filled 2021-03-06: qty 1

## 2021-03-06 NOTE — Progress Notes (Signed)
PT Cancellation Note  Patient Details Name: [name] MRN: [id number] DOB: 1941/10/05   Cancelled Treatment:     Noted Pt order, will check on pt in the morning . Holding PT assessment today due to critically low potassium level and brady.    Clide Dales 03/06/2021, 4:45 PM  Gatha Mayer, PT, MPT Acute Rehabilitation Services Office: [phone] Pager: [phone] 03/06/2021

## 2021-03-06 NOTE — TOC Progression Note (Signed)
Transition of Care [facility]) - Progression Note    Patient Details  Name: [name] MRN: [id number] Date of Birth: 05-06-1941  [facility] of [facility] CM/SW Contact  Purcell Mouton, RN Phone Number: 03/06/2021, 11:29 AM  Clinical Narrative:    TOC reviewed pt's chart. Pt from home alone. Will continue to follow.    Expected Discharge Plan: Home/Self Care Barriers to Discharge: No Barriers Identified  Expected Discharge Plan and Services Expected Discharge Plan: Home/Self Care       Living arrangements for the past 2 months: Single Family Home                                       Social Determinants of Health (SDOH) Interventions    Readmission Risk Interventions No flowsheet data found.

## 2021-03-06 NOTE — Progress Notes (Signed)
   03/05/21 2351  Provider Notification  Provider Name/Title Opyd MD  Date Provider Notified 03/05/21  Time Provider Notified 5486  Notification Type Page  Notification Reason Critical result (K<2.0)  Date Critical Result Received 03/05/21  Time Critical Result Received 2344  Provider response See new orders  Date of Provider Response 03/05/21  Time of Provider Response 2358

## 2021-03-06 NOTE — Progress Notes (Signed)
Noted to be sinus brady sustained in lower 40's. Received call from telemetry. Patient resting, but arouses to stimuli. No other needs identified. Will continue to monitor.

## 2021-03-06 NOTE — Progress Notes (Signed)
PROGRESS NOTE    [name]  VPX:[id number] DOB: 07-Jun-1941 DOA: 03/05/2021 PCP: Biagio Borg, MD    Brief Narrative:  80 y.o. female with medical history significant of HTN, HLD, CAD, GERD. Presenting with fatigue. She reports that she's had increased fatigue for the last week. She has not changed any of her normal habits or introduced any new medications prior to that time. She does not know why she feels generally fatigued. She reports a couple of episodes of nausea during this time, but denies any fever, diarrhea, respiratory symptoms or urinary symptoms. She went to her PCP yesterday to get checked out. He drew some labs, and informed her today that her potassium was critically low. He recommended that she come to the ED for care.  Assessment & Plan:   Active Problems:   Hypokalemia  Hypokalemia     - hold her lasix for now     - Mg2+ noted to be stable     - Given aggressive potassium replacement overnight -Potassium remains persistently low, will continue to replace aggressively as tolerated -Repeat bmet in AM  AKI     - baseline SCr is ~0.7, Cr of 1.77 at presentation     - holding lasix, avoid nephrotoxins     - continued on IVF hydration     - abd [place] reviewed. Findings notable for normal appearing kidneys. Incidental large gallstones without obstruction noted -repeat bmet in AM  Metabolic alkalosis N/V     - she only endorses a couple episodes of nausea/vomiting prior to admit     - Continue with IVF hydration  Normocytic anemia     - no evidence of acute blood loss -cont to follow CBC trends  HTN     - normotensive; resume regimen once med rec complete  CAD     - continue ASA, hold statin d/t elevated LFTs  Elevated LFTs     - hepatitis panel is neg -LFT's are trending down  GERD     - protonix and H2 blocker on home med rec, would continue  DVT prophylaxis: SCD's Code Status: Full Family Communication: Pt in room, family not at  bedside  Status is: Inpatient  Remains inpatient appropriate because:Persistent severe electrolyte disturbances   Dispo: The patient is from: Home              Anticipated d/c is to: Home              Patient currently is not medically stable to d/c.   Difficult to place patient No       Consultants:     Procedures:     Antimicrobials: Anti-infectives (From admission, onward)   None       Subjective: Feels weak this AM  Objective: Vitals:   03/06/21 0355 03/06/21 0417 03/06/21 0753 03/06/21 1323  BP: 107/66  118/76 101/70  Pulse: (!) 51  (!) 42 (!) 44  Resp: 18  18 18   Temp: 98.4 F (36.9 C)  98.2 F (36.8 C) 98.3 F (36.8 C)  TempSrc: Oral  Oral Oral  SpO2: 100%  96% 96%  Weight:  93.1 kg    Height:        Intake/Output Summary (Last 24 hours) at 03/06/2021 1435 Last data filed at 03/06/2021 0600 Gross per 24 hour  Intake 2480.46 ml  Output 150 ml  Net 2330.46 ml   Filed Weights   03/05/21 1329 03/05/21 1817 03/06/21 0417  Weight: 91.6 kg 92.2  kg 93.1 kg    Examination: General exam: Awake, laying in bed, in nad Respiratory system: Normal respiratory effort, no wheezing Cardiovascular system: regular rate, s1, s2 Gastrointestinal system: Soft, nondistended, positive BS Central nervous system: CN2-12 grossly intact, strength intact Extremities: Perfused, no clubbing Skin: Normal skin turgor, no notable skin lesions seen Psychiatry: Mood normal // no visual hallucinations   Data Reviewed: I have personally reviewed following labs and imaging studies  CBC: Recent Labs  Lab 03/04/21 1602 03/05/21 1409 03/06/21 0348  WBC 5.0 4.4 3.6*  NEUTROABS 3.0 2.7  --   HGB 11.4* 10.9* 9.9*  HCT 34.8* 35.8* 33.0*  MCV 82.6 88.8 89.9  PLT 220.0 216 132   Basic Metabolic Panel: Recent Labs  Lab 03/04/21 1602 03/05/21 1409 03/05/21 2258 03/06/21 0348 03/06/21 0946 03/06/21 1333  NA 139 141  --  142  --   --   K 1.9 repeated * <2.0* <2.0* <2.0*  2.4* 2.5*  CL 79* 78*  --  89*  --   --   CO2 49* 50*  --  40*  --   --   GLUCOSE 106* 135*  --  104*  --   --   BUN 16 16  --  14  --   --   CREATININE 1.55* 1.77*  --  1.23*  --   --   CALCIUM 9.5 9.4  --  8.4*  --   --   MG  --  3.3*  --   --   --   --   PHOS  --   --  2.3*  --   --   --    GFR: Estimated Creatinine Clearance: 41.7 mL/min (A) (by C-G formula based on SCr of 1.23 mg/dL (H)). Liver Function Tests: Recent Labs  Lab 03/04/21 1602 03/05/21 1409 03/06/21 0348  AST 84* 100* 62*  ALT 41* 47* 34  ALKPHOS 67 69 54  BILITOT 0.8 0.6 0.8  PROT 6.5 7.2 5.7*  ALBUMIN 3.6 3.8 2.8*   Recent Labs  Lab 03/05/21 1409  LIPASE 27   No results for input(s): AMMONIA in the last 168 hours. Coagulation Profile: No results for input(s): INR, PROTIME in the last 168 hours. Cardiac Enzymes: No results for input(s): CKTOTAL, CKMB, CKMBINDEX, TROPONINI in the last 168 hours. BNP (last 3 results) No results for input(s): PROBNP in the last 8760 hours. HbA1C: No results for input(s): HGBA1C in the last 72 hours. CBG: No results for input(s): GLUCAP in the last 168 hours. Lipid Profile: No results for input(s): CHOL, HDL, LDLCALC, TRIG, CHOLHDL, LDLDIRECT in the last 72 hours. Thyroid Function Tests: Recent Labs    03/04/21 1602  TSH 0.88   Anemia Panel: Recent Labs    03/04/21 1602  FERRITIN 504.1*  IRON 63   Sepsis Labs: No results for input(s): PROCALCITON, LATICACIDVEN in the last 168 hours.  Recent Results (from the past 240 hour(s))  SARS CORONAVIRUS 2 (TAT 6-24 HRS) Nasopharyngeal Nasopharyngeal Swab     Status: None   Collection Time: 03/05/21  4:30 PM   Specimen: Nasopharyngeal Swab  Result Value Ref Range Status   SARS Coronavirus 2 NEGATIVE NEGATIVE Final    Comment: (NOTE) SARS-CoV-2 target nucleic acids are NOT DETECTED.  The SARS-CoV-2 RNA is generally detectable in upper and lower respiratory specimens during the acute phase of infection.  Negative results do not preclude SARS-CoV-2 infection, do not rule out co-infections with other pathogens, and should not be used as  the sole basis for treatment or other patient management decisions. Negative results must be combined with clinical observations, patient history, and epidemiological information. The expected result is Negative.  Fact Sheet for Patients: SugarRoll.be  Fact Sheet for Healthcare Providers: https://www.woods-mathews.com/  This test is not yet approved or cleared by the [place] FDA and  has been authorized for detection and/or diagnosis of SARS-CoV-2 by FDA under an Emergency Use Authorization (EUA). This EUA will remain  in effect (meaning this test can be used) for the duration of the COVID-19 declaration under Se ction 564(b)(1) of the Act, 21 U.S.C. section 360bbb-3(b)(1), unless the authorization is terminated or revoked sooner.  Performed at [facility], [place] [address] [place], [place] [address]      Radiology Studies: US Abdomen Complete  Result Date: 03/06/2021 CLINICAL DATA:  Elevated LFTs.  Acute renal insufficiency. EXAM: ABDOMEN ULTRASOUND COMPLETE COMPARISON:  Ultrasound 07/30/2005. FINDINGS: Gallbladder: Gallstone again noted. Gallstone measures 2 cm. Gallbladder wall thickness 1.4 mm. Negative Murphy sign. Common bile duct: Diameter: 5.0 mm Liver: No focal lesion identified. Within normal limits in parenchymal echogenicity. Portal vein is patent on color Doppler imaging with normal direction of blood flow towards the liver. IVC: No abnormality visualized. Pancreas: Mild pancreatic duct dilatation again noted. Pancreatic duct diameter is 3.2 mm. No pancreatic mass identified. Spleen: Size and appearance within normal limits. Right Kidney: Length: 11.6 cm. Echogenicity within normal limits. No mass or hydronephrosis visualized. Left Kidney: Length: 1 cm. Echogenicity within normal limits. No  mass or hydronephrosis visualized. Abdominal aorta: Abdominal aortic diameter 2.9 cm. Other findings: None. IMPRESSION: 1. Large gallstone again noted. Gallstone measures 2 cm. No evidence of cholecystitis. No evidence of biliary distention. Liver appears normal. 2. Mild pancreatic duct dilatation again noted. Pancreatic duct diameter is 3.2 mm. No pancreatic mass identified. Electronically Signed   By: Marcello Moores  Register   On: 03/06/2021 06:35    Scheduled Meds: . aspirin EC  81 mg Oral Daily  . famotidine  20 mg Oral BID  . ferrous sulfate  325 mg Oral Daily  . pantoprazole  40 mg Oral Daily  . potassium chloride  20 mEq Oral BID  . potassium chloride  60 mEq Oral BID   Continuous Infusions: . sodium chloride 75 mL/hr at 03/06/21 0940     LOS: 1 day   Marylu Lund, MD [facility] Pager On Amion  If 7PM-7AM, please contact night-coverage 03/06/2021, 2:35 PM

## 2021-03-06 NOTE — Progress Notes (Signed)
   03/06/21 0451  Provider Notification  Provider Name/Title Opyd MD  Date Provider Notified 03/06/21  Time Provider Notified [phone]  Notification Type Page  Notification Reason Critical result (K <2.0)  Date Critical Result Received 03/06/21  Time Critical Result Received 3570  Provider response See new orders  Date of Provider Response 03/06/21  Time of Provider Response [phone]

## 2021-03-06 NOTE — Plan of Care (Signed)
  Problem: Clinical Measurements: Goal: Ability to maintain clinical measurements within normal limits will improve Outcome: Progressing   Problem: Clinical Measurements: Goal: Will remain free from infection Outcome: Progressing   Problem: Clinical Measurements: Goal: Ability to maintain clinical measurements within normal limits will improve Outcome: Progressing   Problem: Nutrition: Goal: Adequate nutrition will be maintained Outcome: Progressing   Problem: Safety: Goal: Ability to remain free from injury will improve Outcome: Progressing

## 2021-03-06 NOTE — Progress Notes (Signed)
   03/06/21 1430  Provider Notification  Provider Name/Title Donne Hazel MD  Date Provider Notified 03/06/21  Time Provider Notified 1432  Notification Type Page  Notification Reason Critical result (Potassium 2.5.)  Test performed and critical result Potassium 2.5  Date Critical Result Received 03/06/21  Time Critical Result Received 1433  Provider response See new orders  Awaiting new orders. Patient remains alert and oriented. Sinus brady on the monitor.

## 2021-03-06 NOTE — Progress Notes (Signed)
Results for HAILEA, EAGLIN (MRN [id number]) as of 03/06/2021 10:26  Ref. Range 03/06/2021 09:46  Potassium Latest Ref Range: 3.5 - 5.1 mmol/L 2.4 (LL)  Received call from lab. Patient potassium 2.4. Patient currently on last run of IV potassium via IV. Made MD Marylu Lund aware via secure method. Awaiting new orders. Patient resting with RR even and unlabored, arouses to stimuli. Sinus rhythm on the monitor. Will continue to monitor.

## 2021-03-07 DIAGNOSIS — E876 Hypokalemia: Principal | ICD-10-CM

## 2021-03-07 LAB — MAGNESIUM: Magnesium: 2.5 mg/dL — ABNORMAL HIGH (ref 1.7–2.4)

## 2021-03-07 LAB — COMPREHENSIVE METABOLIC PANEL
ALT: 33 U/L (ref 0–44)
AST: 41 U/L (ref 15–41)
Albumin: 3 g/dL — ABNORMAL LOW (ref 3.5–5.0)
Alkaline Phosphatase: 58 U/L (ref 38–126)
Anion gap: 10 (ref 5–15)
BUN: 7 mg/dL — ABNORMAL LOW (ref 8–23)
CO2: 27 mmol/L (ref 22–32)
Calcium: 8.4 mg/dL — ABNORMAL LOW (ref 8.9–10.3)
Chloride: 105 mmol/L (ref 98–111)
Creatinine, Ser: 1.1 mg/dL — ABNORMAL HIGH (ref 0.44–1.00)
GFR, Estimated: 51 mL/min — ABNORMAL LOW (ref >60)
Glucose, Bld: 95 mg/dL (ref 70–99)
Potassium: 3.7 mmol/L (ref 3.5–5.1)
Sodium: 142 mmol/L (ref 135–145)
Total Bilirubin: 0.4 mg/dL (ref 0.3–1.2)
Total Protein: 6.3 g/dL — ABNORMAL LOW (ref 6.5–8.1)

## 2021-03-07 LAB — BASIC METABOLIC PANEL
Anion gap: 9 (ref 5–15)
BUN: 8 mg/dL (ref 8–23)
CO2: 32 mmol/L (ref 22–32)
Calcium: 8 mg/dL — ABNORMAL LOW (ref 8.9–10.3)
Chloride: 101 mmol/L (ref 98–111)
Creatinine, Ser: 1.07 mg/dL — ABNORMAL HIGH (ref 0.44–1.00)
GFR, Estimated: 53 mL/min — ABNORMAL LOW (ref >60)
Glucose, Bld: 97 mg/dL (ref 70–99)
Potassium: 3.4 mmol/L — ABNORMAL LOW (ref 3.5–5.1)
Sodium: 142 mmol/L (ref 135–145)

## 2021-03-07 LAB — POTASSIUM: Potassium: 3.3 mmol/L — ABNORMAL LOW (ref 3.5–5.1)

## 2021-03-07 MED ORDER — POTASSIUM CHLORIDE CRYS ER 20 MEQ PO TBCR: 20.0000 meq | EXTENDED_RELEASE_TABLET | Freq: Two times a day (BID) | ORAL | Status: DC

## 2021-03-07 MED ORDER — POTASSIUM CHLORIDE CRYS ER 20 MEQ PO TBCR
40.0000 meq | EXTENDED_RELEASE_TABLET | Freq: Once | ORAL | Status: AC
  Administered 2021-03-07: 40 meq via ORAL
  Filled 2021-03-07: qty 2

## 2021-03-07 MED ORDER — POTASSIUM CHLORIDE CRYS ER 20 MEQ PO TBCR
40.0000 meq | EXTENDED_RELEASE_TABLET | Freq: Two times a day (BID) | ORAL | Status: AC
  Administered 2021-03-07 (×2): 40 meq via ORAL
  Filled 2021-03-07 (×2): qty 2

## 2021-03-07 NOTE — Evaluation (Addendum)
Physical Therapy Evaluation Patient Details Name: [name] MRN: [id number] DOB: [birth year] Today's Date: 03/07/2021   History of Present Illness  [name] is a 80 y.o. female presents wtih fatigue, increased fatigue, critically low K+.  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 Pt independent at baseline, daughter home 31 of the time and has cousin available to assist as needed, uses [facility] for ambulation, reports 1 fall on Christmas Day due to "I just lost my balance". Pt currently not requiring physical assistance, min G for safety and cues for RW management and hand placement, limited to 10 ft with RW. Pt able to complete pericare after BM while standing, min G for safety. Once in chair, pt able to perform seated BLE strengthening exercises with cues for form; educated to perform additional reps throughout the day when seated up in chair with verbalized understanding. Pt's goal is to return home with daughter and cousin to assist, cousin in room in agreement. Recommending HHPT for further strengthening and gait training with RW due to previously using [facility]. Pt currently with functional limitations due to the deficits listed below (see PT Problem List). Pt will benefit from skilled PT to increase their independence and safety with mobility to allow discharge to the venue listed below.       Follow Up Recommendations Home health PT;Supervision - Intermittent    Equipment Recommendations  Rolling walker with 5" wheels    Recommendations for Other Services       Precautions / Restrictions Precautions Precautions: Fall Restrictions Weight Bearing Restrictions: No      Mobility  Bed Mobility Overal bed mobility: Needs Assistance Bed Mobility: Supine to Sit  Supine to sit: Min guard;HOB elevated  General bed mobility comments: elevated HOB and use of bedrail to upright trunk and scoot out to EOB, min  G for safety    Transfers Overall transfer level: Needs assistance Equipment used: Rolling walker (2 wheeled);None Transfers: Sit to/from [employer] to Stand: Min guard Stand pivot transfers: Min guard  General transfer comment: cues for hand placement with rising to stand, initial STS and pivot to [facility] with hands steading on furniture, BSC to rise with RW with cues for hand placement  Ambulation/Gait Ambulation/Gait assistance: Min guard Gait Distance (Feet): 10 Feet Assistive device: Rolling walker (2 wheeled) Gait Pattern/deviations: Step-through pattern;Decreased stride length;Trunk flexed;Wide base of support Gait velocity: decreased   General Gait Details: short, slow steps with wide BOS in room, using RW to steady self, no LOB  Stairs            Wheelchair Mobility    Modified Rankin (Stroke Patients Only)       Balance Overall balance assessment: Needs assistance Sitting-balance support: Feet supported Sitting balance-Leahy Scale: Good Sitting balance - Comments: seated EOB   Standing balance support: During functional activity Standing balance-Leahy Scale: Fair Standing balance comment: able to release UE from RW to perform standing pericare and transfer, RW for ambulation        Pertinent Vitals/Pain Pain Assessment: No/denies pain    Home Living Family/patient expects to be discharged to:: Private residence Living Arrangements: Children (daughter) Available Help at Discharge: Family;Available PRN/intermittently Type of Home: House Home Access: Stairs to enter Entrance Stairs-Rails: Right;Left;Can reach both Entrance Stairs-Number of Steps: 5 Home Layout: One level Home Equipment: Cane - single point      Prior Function Level  of Independence: Independent with assistive device(s)  Comments: Pt reports independent with with household ambulation, ADLs, drives.     Hand Dominance        Extremity/Trunk Assessment   Upper  Extremity Assessment Upper Extremity Assessment: Overall WFL for tasks assessed    Lower Extremity Assessment Lower Extremity Assessment: Overall WFL for tasks assessed (AROM WNL, strength 3+/5, denies numbness/tingling throughout)    Cervical / Trunk Assessment Cervical / Trunk Assessment: Normal  Communication   Communication: No difficulties  Cognition Arousal/Alertness: Awake/alert Behavior During Therapy: WFL for tasks assessed/performed Overall Cognitive Status: Within Functional Limits for tasks assessed         General Comments General comments (skin integrity, edema, etc.): Pt HR in 50s-60s during eval    Exercises General Exercises - Lower Extremity Long Arc Quad: Seated;AROM;Strengthening;Both;10 reps Hip Flexion/Marching: Seated;AROM;Strengthening;Both;10 reps   Assessment/Plan    PT Assessment Patient needs continued PT services  PT Problem List Decreased strength;Decreased activity tolerance;Decreased balance;Decreased knowledge of use of DME;Obesity       PT Treatment Interventions DME instruction;Gait training;Stair training;Functional mobility training;Therapeutic activities;Therapeutic exercise;Balance training;Patient/family education    PT Goals (Current goals can be found in the Care Plan section)  Acute Rehab PT Goals Patient Stated Goal: "return home with my daughter or cousin to help me" PT Goal Formulation: With patient Time For Goal Achievement: 03/21/21 Potential to Achieve Goals: Good    Frequency Min 3X/week   Barriers to discharge        Co-evaluation               AM-PAC PT "6 Clicks" Mobility  Outcome Measure Help needed turning from your back to your side while in a flat bed without using bedrails?: A Little Help needed moving from lying on your back to sitting on the side of a flat bed without using bedrails?: A Little Help needed moving to and from a bed to a chair (including a wheelchair)?: A Little Help needed standing  up from a chair using your arms (e.g., wheelchair or bedside chair)?: A Little Help needed to walk in hospital room?: A Little Help needed climbing 3-5 steps with a railing? : A Little 6 Click Score: 18    End of Session   Activity Tolerance: Patient tolerated treatment well Patient left: in chair;with call bell/phone within reach Nurse Communication: Mobility status;Other (comment) (BM) PT Visit Diagnosis: Other abnormalities of gait and mobility (R26.89);Unsteadiness on feet (R26.81)    Time: 1103-1130 PT Time Calculation (min) (ACUTE ONLY): 27 min   Charges:   PT Evaluation $PT Eval Low Complexity: 1 Low PT Treatments $Therapeutic Exercise: 8-22 mins         Tori Terique Kawabata PT, DPT 03/07/21, 12:11 PM

## 2021-03-07 NOTE — Plan of Care (Signed)
  Problem: Clinical Measurements: Goal: Ability to maintain clinical measurements within normal limits will improve Outcome: Progressing   Problem: Clinical Measurements: Goal: Diagnostic test results will improve Outcome: Progressing   Problem: Activity: Goal: Risk for activity intolerance will decrease Outcome: Progressing   Problem: Nutrition: Goal: Adequate nutrition will be maintained Outcome: Progressing   Problem: Safety: Goal: Ability to remain free from injury will improve Outcome: Progressing   

## 2021-03-07 NOTE — Progress Notes (Signed)
PROGRESS NOTE    [name]  QJF:[id number] DOB: 07/22/1941 DOA: 03/05/2021 PCP: Biagio Borg, MD    Brief Narrative:  80 y.o. female with medical history significant of HTN, HLD, CAD, GERD. Presenting with fatigue. She reports that she's had increased fatigue for the last week. She has not changed any of her normal habits or introduced any new medications prior to that time. She does not know why she feels generally fatigued. She reports a couple of episodes of nausea during this time, but denies any fever, diarrhea, respiratory symptoms or urinary symptoms. She went to her PCP yesterday to get checked out. He drew some labs, and informed her today that her potassium was critically low. He recommended that she come to the ED for care.  Assessment & Plan:   Active Problems:   Hypokalemia  Hypokalemia     - hold her lasix for now     - Mg2+ noted to be stable     - Given aggressive potassium replacement overnight -Potassium has improved, but still remains low -Will continue to replace, repeat cmp  AKI     - baseline SCr is ~0.7, Cr of 1.77 at presentation     - holding lasix, avoid nephrotoxins     - continued on IVF hydration     - abd [place] reviewed. Findings notable for normal appearing kidneys. Incidental large gallstones without obstruction noted -renal function now much improved  Metabolic alkalosis N/V     - she only endorses a couple episodes of nausea/vomiting prior to admit     - Continue with IVF hydration as tolerated  Normocytic anemia     - no evidence of acute blood loss -cont to follow CBC trends  HTN     - normotensive; resume regimen once med rec complete  CAD     - continue ASA, hold statin d/t elevated LFTs  Elevated LFTs     - hepatitis panel is neg -LFT's are trending down  GERD     - protonix and H2 blocker on home med rec, would continue  DVT prophylaxis: SCD's Code Status: Full Family Communication: Pt in room, family currently at  bedside  Status is: Inpatient  Remains inpatient appropriate because:Persistent severe electrolyte disturbances   Dispo: The patient is from: Home              Anticipated d/c is to: Home with HHPT              Patient currently is not medically stable to d/c.   Difficult to place patient No   Consultants:     Procedures:     Antimicrobials: Anti-infectives (From admission, onward)   None      Subjective: Reports feeling better. Eager to go home soon  Objective: Vitals:   03/06/21 1323 03/06/21 2214 03/07/21 0446 03/07/21 0500  BP: 101/70 119/77 134/81   Pulse: (!) 44 97 (!) 46   Resp: 18 16 16    Temp: 98.3 F (36.8 C) 98.6 F (37 C) 98.5 F (36.9 C)   TempSrc: Oral Oral Oral   SpO2: 96% 100% 100%   Weight:    98 kg  Height:        Intake/Output Summary (Last 24 hours) at 03/07/2021 1336 Last data filed at 03/07/2021 1208 Gross per 24 hour  Intake 2795.1 ml  Output 550 ml  Net 2245.1 ml   Filed Weights   03/05/21 1817 03/06/21 0417 03/07/21 0500  Weight: 92.2 kg  93.1 kg 98 kg    Examination: General exam: Conversant, in no acute distress Respiratory system: normal chest rise, clear, no audible wheezing Cardiovascular system: regular rhythm, s1-s2 Gastrointestinal system: Nondistended, nontender, pos BS Central nervous system: No seizures, no tremors Extremities: No cyanosis, no joint deformities Skin: No rashes, no pallor Psychiatry: Affect normal // no auditory hallucinations   Data Reviewed: I have personally reviewed following labs and imaging studies  CBC: Recent Labs  Lab 03/04/21 1602 03/05/21 1409 03/06/21 0348  WBC 5.0 4.4 3.6*  NEUTROABS 3.0 2.7  --   HGB 11.4* 10.9* 9.9*  HCT 34.8* 35.8* 33.0*  MCV 82.6 88.8 89.9  PLT 220.0 216 259   Basic Metabolic Panel: Recent Labs  Lab 03/04/21 1602 03/05/21 1409 03/05/21 2258 03/06/21 0348 03/06/21 0946 03/06/21 1333 03/06/21 1753 03/06/21 2148 03/07/21 0208 03/07/21 0545  NA  139 141  --  142  --   --   --   --  142  --   K 1.9 repeated * <2.0* <2.0* <2.0*   < > 2.5* 2.6* 3.5 3.4* 3.3*  CL 79* 78*  --  89*  --   --   --   --  101  --   CO2 49* 50*  --  40*  --   --   --   --  32  --   GLUCOSE 106* 135*  --  104*  --   --   --   --  97  --   BUN 16 16  --  14  --   --   --   --  8  --   CREATININE 1.55* 1.77*  --  1.23*  --   --   --   --  1.07*  --   CALCIUM 9.5 9.4  --  8.4*  --   --   --   --  8.0*  --   MG  --  3.3*  --   --   --   --   --   --  2.5*  --   PHOS  --   --  2.3*  --   --   --   --   --   --   --    < > = values in this interval not displayed.   GFR: Estimated Creatinine Clearance: 49.3 mL/min (A) (by C-G formula based on SCr of 1.07 mg/dL (H)). Liver Function Tests: Recent Labs  Lab 03/04/21 1602 03/05/21 1409 03/06/21 0348  AST 84* 100* 62*  ALT 41* 47* 34  ALKPHOS 67 69 54  BILITOT 0.8 0.6 0.8  PROT 6.5 7.2 5.7*  ALBUMIN 3.6 3.8 2.8*   Recent Labs  Lab 03/05/21 1409  LIPASE 27   No results for input(s): AMMONIA in the last 168 hours. Coagulation Profile: No results for input(s): INR, PROTIME in the last 168 hours. Cardiac Enzymes: No results for input(s): CKTOTAL, CKMB, CKMBINDEX, TROPONINI in the last 168 hours. BNP (last 3 results) No results for input(s): PROBNP in the last 8760 hours. HbA1C: No results for input(s): HGBA1C in the last 72 hours. CBG: No results for input(s): GLUCAP in the last 168 hours. Lipid Profile: No results for input(s): CHOL, HDL, LDLCALC, TRIG, CHOLHDL, LDLDIRECT in the last 72 hours. Thyroid Function Tests: Recent Labs    03/04/21 1602  TSH 0.88   Anemia Panel: Recent Labs    03/04/21 1602  FERRITIN 504.1*  IRON 63   Sepsis  Labs: No results for input(s): PROCALCITON, LATICACIDVEN in the last 168 hours.  Recent Results (from the past 240 hour(s))  SARS CORONAVIRUS 2 (TAT 6-24 HRS) Nasopharyngeal Nasopharyngeal Swab     Status: None   Collection Time: 03/05/21  4:30 PM    Specimen: Nasopharyngeal Swab  Result Value Ref Range Status   SARS Coronavirus 2 NEGATIVE NEGATIVE Final    Comment: (NOTE) SARS-CoV-2 target nucleic acids are NOT DETECTED.  The SARS-CoV-2 RNA is generally detectable in upper and lower respiratory specimens during the acute phase of infection. Negative results do not preclude SARS-CoV-2 infection, do not rule out co-infections with other pathogens, and should not be used as the sole basis for treatment or other patient management decisions. Negative results must be combined with clinical observations, patient history, and epidemiological information. The expected result is Negative.  Fact Sheet for Patients: SugarRoll.be  Fact Sheet for Healthcare Providers: https://www.woods-mathews.com/  This test is not yet approved or cleared by the [place] FDA and  has been authorized for detection and/or diagnosis of SARS-CoV-2 by FDA under an Emergency Use Authorization (EUA). This EUA will remain  in effect (meaning this test can be used) for the duration of the COVID-19 declaration under Se ction 564(b)(1) of the Act, 21 U.S.C. section 360bbb-3(b)(1), unless the authorization is terminated or revoked sooner.  Performed at [facility], [place] [address] [place],  [address]      Radiology Studies: US Abdomen Complete  Result Date: 03/06/2021 CLINICAL DATA:  Elevated LFTs.  Acute renal insufficiency. EXAM: ABDOMEN ULTRASOUND COMPLETE COMPARISON:  Ultrasound 07/30/2005. FINDINGS: Gallbladder: Gallstone again noted. Gallstone measures 2 cm. Gallbladder wall thickness 1.4 mm. Negative Murphy sign. Common bile duct: Diameter: 5.0 mm Liver: No focal lesion identified. Within normal limits in parenchymal echogenicity. Portal vein is patent on color Doppler imaging with normal direction of blood flow towards the liver. IVC: No abnormality visualized. Pancreas: Mild pancreatic duct  dilatation again noted. Pancreatic duct diameter is 3.2 mm. No pancreatic mass identified. Spleen: Size and appearance within normal limits. Right Kidney: Length: 11.6 cm. Echogenicity within normal limits. No mass or hydronephrosis visualized. Left Kidney: Length: 1 cm. Echogenicity within normal limits. No mass or hydronephrosis visualized. Abdominal aorta: Abdominal aortic diameter 2.9 cm. Other findings: None. IMPRESSION: 1. Large gallstone again noted. Gallstone measures 2 cm. No evidence of cholecystitis. No evidence of biliary distention. Liver appears normal. 2. Mild pancreatic duct dilatation again noted. Pancreatic duct diameter is 3.2 mm. No pancreatic mass identified. Electronically Signed   By: Marcello Moores  Register   On: 03/06/2021 06:35    Scheduled Meds: . aspirin EC  81 mg Oral Daily  . famotidine  20 mg Oral BID  . ferrous sulfate  325 mg Oral Daily  . pantoprazole  40 mg Oral Daily  . [START ON 03/08/2021] potassium chloride  20 mEq Oral BID  . rosuvastatin  40 mg Oral Daily   Continuous Infusions: . sodium chloride 75 mL/hr at 03/06/21 2309     LOS: 2 days   Marylu Lund, MD [facility] Pager On Amion  If 7PM-7AM, please contact night-coverage 03/07/2021, 1:36 PM

## 2021-03-08 ENCOUNTER — Ambulatory Visit: Payer: BC Managed Care – PPO | Admitting: Internal Medicine

## 2021-03-08 DIAGNOSIS — Z0289 Encounter for other administrative examinations: Secondary | ICD-10-CM

## 2021-03-08 LAB — COMPREHENSIVE METABOLIC PANEL
ALT: 29 U/L (ref 0–44)
AST: 30 U/L (ref 15–41)
Albumin: 2.7 g/dL — ABNORMAL LOW (ref 3.5–5.0)
Alkaline Phosphatase: 52 U/L (ref 38–126)
Anion gap: 7 (ref 5–15)
BUN: 9 mg/dL (ref 8–23)
CO2: 26 mmol/L (ref 22–32)
Calcium: 8.4 mg/dL — ABNORMAL LOW (ref 8.9–10.3)
Chloride: 109 mmol/L (ref 98–111)
Creatinine, Ser: 1.07 mg/dL — ABNORMAL HIGH (ref 0.44–1.00)
GFR, Estimated: 53 mL/min — ABNORMAL LOW (ref >60)
Glucose, Bld: 99 mg/dL (ref 70–99)
Potassium: 3.8 mmol/L (ref 3.5–5.1)
Sodium: 142 mmol/L (ref 135–145)
Total Bilirubin: 0.5 mg/dL (ref 0.3–1.2)
Total Protein: 5.8 g/dL — ABNORMAL LOW (ref 6.5–8.1)

## 2021-03-08 LAB — GLUCOSE, CAPILLARY: Glucose-Capillary: 111 mg/dL — ABNORMAL HIGH (ref 70–99)

## 2021-03-08 MED ORDER — POTASSIUM CHLORIDE CRYS ER 20 MEQ PO TBCR: 20.0000 meq | EXTENDED_RELEASE_TABLET | Freq: Two times a day (BID) | ORAL | 0 refills | Status: DC

## 2021-03-08 MED ORDER — POTASSIUM CHLORIDE CRYS ER 20 MEQ PO TBCR
60.0000 meq | EXTENDED_RELEASE_TABLET | Freq: Once | ORAL | Status: AC
  Administered 2021-03-08: 60 meq via ORAL
  Filled 2021-03-08: qty 3

## 2021-03-08 MED ORDER — POTASSIUM CHLORIDE CRYS ER 20 MEQ PO TBCR: 20.0000 meq | EXTENDED_RELEASE_TABLET | Freq: Two times a day (BID) | ORAL | Status: DC

## 2021-03-08 MED ORDER — FUROSEMIDE 40 MG PO TABS: 40.0000 mg | ORAL_TABLET | Freq: Every day | ORAL | 0 refills | Status: DC

## 2021-03-08 NOTE — Progress Notes (Signed)
Pushed out via wheelchair. Patient alert and oriented upon leaving. Patient left walker at bedside. Per unit secretary family member would be to bedside to pick up walker in 1 hour. No other needs identified. Patient was alert and oriented upon leaving. Swelling to left hand had also resolved.

## 2021-03-08 NOTE — Progress Notes (Signed)
Physical Therapy Treatment Patient Details Name: [name] MRN: [id number] DOB: 10/03/1941 Today's Date: 03/08/2021    History of Present Illness [name] is a 80 y.o. female presents wtih fatigue, increased fatigue, critically low K+.  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 Pt with decreased L weight-shift with distance and fatigue due to chronic L knee pain, no unsteadiness or LOB. Pt tolerates BLE strengthening exercises with cues for motor control. Pt still planning to go home with daughter and cousin to assist as needed. Continue to progress as able.   Follow Up Recommendations  Home health PT     Equipment Recommendations  Rolling walker with 5" wheels    Recommendations for Other Services       Precautions / Restrictions Precautions Precautions: Fall Restrictions Weight Bearing Restrictions: No    Mobility  Bed Mobility  General bed mobility comments: in chair upon arrival    Transfers Overall transfer level: Needs assistance Equipment used: Rolling walker (2 wheeled) Transfers: Sit to/from Stand;Stand Pivot Transfers Sit to Stand: Supervision Stand pivot transfers: Supervision    General transfer comment: able to power up to stand with BUE asisting, good steadiness upon rising, initial verbal cues for hand placement with good carryover for subsequent reps  Ambulation/Gait Ambulation/Gait assistance: Supervision Gait Distance (Feet): 85 Feet (85 ft x2 with seated rest break, 170 ft total) Assistive device: Rolling walker (2 wheeled) Gait Pattern/deviations: Step-through pattern;Decreased stride length;Trunk flexed;Wide base of support;Decreased weight shift to left Gait velocity: decreased   General Gait Details: pt ambulates in hallway with decreased L weight-shift with  distance and fatigue due to L knee pain, continues with sligtly wide BOS and increased lateral weight shifting, good bil foot clearance and no LOB with straightline gait, turns, or backing steps to chair   Stairs             Wheelchair Mobility    Modified Rankin (Stroke Patients Only)       Balance Overall balance assessment: Needs assistance  Standing balance support: During functional activity Standing balance-Leahy Scale: Fair Standing balance comment: static fair, RW for dynamic     Cognition Arousal/Alertness: Awake/alert Behavior During Therapy: WFL for tasks assessed/performed Overall Cognitive Status: Within Functional Limits for tasks assessed     Exercises General Exercises - Lower Extremity Ankle Circles/Pumps: Seated;AROM;Strengthening;Both;10 reps Quad Sets: Seated;AROM;Strengthening;Both;10 reps Long Arc Quad: Seated;AROM;Strengthening;Both;10 reps Straight Leg Raises: Seated;AROM;Strengthening;Both;10 reps    General Comments        Pertinent Vitals/Pain Pain Assessment: No/denies pain    Home Living                      Prior Function            PT Goals (current goals can now be found in the care plan section) Acute Rehab PT Goals Patient Stated Goal: "return home with my daughter or cousin to help me" PT Goal Formulation: With patient Time For Goal Achievement: 03/21/21 Potential to Achieve Goals: Good Progress towards PT goals: Progressing toward goals    Frequency    Min 3X/week      PT Plan Current plan remains appropriate    Co-evaluation              AM-PAC PT "  6 Clicks" Mobility   Outcome Measure  Help needed turning from your back to your side while in a flat bed without using bedrails?: A Little Help needed moving from lying on your back to sitting on the side of a flat bed without using bedrails?: A Little Help needed moving to and from a bed to a chair (including a wheelchair)?: A Little Help needed  standing up from a chair using your arms (e.g., wheelchair or bedside chair)?: A Little Help needed to walk in hospital room?: A Little Help needed climbing 3-5 steps with a railing? : A Little 6 Click Score: 18    End of Session Equipment Utilized During Treatment: Gait belt Activity Tolerance: Patient tolerated treatment well Patient left: in chair;with call bell/phone within reach Nurse Communication: Mobility status PT Visit Diagnosis: Other abnormalities of gait and mobility (R26.89);Unsteadiness on feet (R26.81)     Time: 2330-0762 PT Time Calculation (min) (ACUTE ONLY): 23 min  Charges:  $Gait Training: 8-22 mins $Therapeutic Exercise: 8-22 mins                      Tori Emelda Kohlbeck PT, DPT 03/08/21, 10:49 AM

## 2021-03-08 NOTE — Progress Notes (Signed)
  Subjective:  Patient ID: [name], female    DOB: May 28, 1941,  MRN: [id number]  80 y.o. female presents with at risk foot care. 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 Pain interferes with ambulation. Aggravating factors include wearing enclosed shoe gear. Pain is relieved with periodic professional debridement.  Patient voices no new pedal problems on today's visit. She is accompanied by a friend on today's visit.  Review of Systems: Negative except as noted in the HPI.  PCP is Dr. Cathlean Cower. Last visit was 03/04/2021.   No Known Allergies    Objective:   Constitutional Pt is a pleasant 80 y.o. African American female in NAD.Marland Kitchen AAO x 3.   Vascular Capillary refill time to digits immediate b/l. Palpable pedal pulses b/l LE. Pedal hair sparse. Lower extremity skin temperature gradient within normal limits. No pain with calf compression b/l. Trace edema noted b/l lower extremities. No cyanosis or clubbing noted.  Neurologic Normal speech. Oriented to person, place, and time. Protective sensation intact 5/5 intact bilaterally with 10g monofilament b/l. Vibratory sensation intact b/l. Proprioception intact bilaterally.  Dermatologic Pedal skin with normal turgor, texture and tone bilaterally. No open wounds bilaterally. No interdigital macerations bilaterally. Toenails L hallux, L 4th toe, L 5th toe, R hallux, R 3rd toe, R 4th toe and R 5th toe elongated, discolored, dystrophic, thickened, and crumbly with subungual debris and tenderness to dorsal palpation. Anonychia noted L 3rd toe. Nailbed(s) epithelialized.   Orthopedic: Normal muscle strength 5/5 to all lower extremity muscle groups bilaterally. No pain crepitus or joint limitation noted with ROM b/l. 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   Radiographs: None Assessment:   No diagnosis found. Plan:  Patient was evaluated and  treated and all questions answered.  Onychomycosis with pain -Nails palliatively debridement as below. -Educated on self-care  Procedure: Nail Debridement Rationale: Pain Type of Debridement: manual, sharp debridement. Instrumentation: Nail nipper, rotary burr. Number of Nails: 7  -Examined patient. -No new findings. No new orders. -Toenails L hallux, L 4th toe, L 5th toe, R hallux, R 3rd toe, R 4th toe and R 5th toe debrided in length and girth without iatrogenic bleeding with sterile nail nipper and dremel.  -Patient to report any pedal injuries to medical professional immediately. -Patient to continue soft, supportive shoe gear daily. -Patient/POA to call should there be question/concern in the interim.  Return in about 3 months (around 06/04/2021).  Marzetta Board, DPM

## 2021-03-08 NOTE — Progress Notes (Signed)
Upon attempting to discharge patient. Swelling noted to left lower hand. PIV still in place. PIV removed from hand. Ice pack applied to hand. Arm elevated on pillow. Instructed to wait 30 minutes to make sure swelling resolves before discharge. No other needs identified. Will continue to monitor.

## 2021-03-08 NOTE — Discharge Summary (Signed)
Physician Discharge Summary  [name] KVQ:[id number] DOB: 1941/10/22 DOA: 03/05/2021  PCP: Biagio Borg, MD  Admit date: 03/05/2021 Discharge date: 03/08/2021  Admitted From: Home Disposition:  Home  Recommendations for Outpatient Follow-up:  1. Follow up with PCP in 1-2 weeks 2. Please obtain BMP/CBC in one week 3. Please follow up on the following pending results:  Home Health:PT  Equipment/Devices:Rolling walker    Discharge Condition:Improved CODE STATUS:Full Diet recommendation: Heart healthy   Brief/Interim Summary: 80 y.o.femalewith medical history significant ofHTN, HLD, CAD, GERD. Presenting with fatigue. She reports that she's had increased fatigue for the last week. She has not changed any of her normal habits or introduced any new medications prior to that time. She does not know why she feels generally fatigued. She reports a couple of episodes of nausea during this time, but denies any fever, diarrhea, respiratory symptoms or urinary symptoms. She went to her PCP yesterday to get checked out. He drew some labs, and informed her today that her potassium was critically low. He recommended that she come to the ED for care.  Discharge Diagnoses:  Active Problems:   Hypokalemia  Hypokalemia -This admit, had required ample amounts of potassium replacement -Have increased dose of home K supplement  -Advised better PO intake on d/c -Recommend repeat BMET in 1-2 weeks  AKI - baseline SCr is ~0.7, Cr of 1.77 at presentation - initially held lasix - abd [place] reviewed. Findings notable for normal appearing kidneys. Incidental large gallstones without obstruction noted -renal function now much improved after IVF  Metabolic alkalosis N/V - she only endorses a couple episodes of nausea/vomiting prior to admit - resolved  Normocytic anemia - no evidence of acute blood loss -remained hemodynamically stable  HTN - remained stable and  controlled  CAD - continue ASA -Held statin initially given mildly elevated LFT's  Elevated LFTs - hepatitis panel is neg -LFT's tended down  GERD - protonix and H2 blocker on home med rec, would continue   Discharge Instructions   Allergies as of 03/08/2021   No Known Allergies     Medication List    STOP taking these medications   budesonide-formoterol 160-4.5 MCG/ACT inhaler Commonly known as: SYMBICORT   docusate sodium 100 MG capsule Commonly known as: COLACE   doxycycline 100 MG tablet Commonly known as: VIBRA-TABS   omeprazole 20 MG capsule Commonly known as: PRILOSEC   omeprazole 40 MG capsule Commonly known as: PRILOSEC   senna 8.6 MG Tabs tablet Commonly known as: SENOKOT   traMADol 50 MG tablet Commonly known as: ULTRAM   traZODone 100 MG tablet Commonly known as: DESYREL     TAKE these medications   albuterol 108 (90 Base) MCG/ACT inhaler Commonly known as: VENTOLIN HFA Inhale 2 puffs into the lungs every 4 (four) hours as needed. For wheeze or shortness of breath What changed:   reasons to take this  additional instructions   alendronate 70 MG tablet Commonly known as: FOSAMAX TAKE 1 TABLET (70 MG TOTAL) BY MOUTH EVERY 7 (SEVEN) DAYS. TAKE WITH A FULL GLASS OF WATER ON AN EMPTY STOMACH.   aspirin 81 MG tablet Take 81 mg by mouth daily.   azelastine 0.1 % nasal spray Commonly known as: ASTELIN Place 1 spray into both nostrils daily as needed for rhinitis or allergies.   [employer] D3 50 MCG (2000 UT) Caps Generic drug: Cholecalciferol Take 1 capsule by mouth daily. What changed: Another medication with the same name was removed. Continue taking  this medication, and follow the directions you see here.   EPINEPHrine 0.3 mg/0.3 mL Soaj injection Commonly known as: EPI-PEN Inject 0.3 mg into the muscle as needed for anaphylaxis.   famotidine 20 MG tablet Commonly known as: PEPCID Take 20 mg by mouth 2 (two) times daily.    ferrous sulfate 325 (65 FE) MG EC tablet Take 325 mg by mouth daily.   fluticasone 50 MCG/ACT nasal spray Commonly known as: FLONASE Place 1 spray into both nostrils 2 (two) times daily as needed for allergies or rhinitis.   furosemide 40 MG tablet Commonly known as: Lasix Take 1 tablet (40 mg total) by mouth daily. What changed:   how much to take  additional instructions   meloxicam 15 MG tablet Commonly known as: MOBIC TAKE 1 TABLET BY MOUTH EVERY DAY AS NEEDED FOR PAIN   montelukast 10 MG tablet Commonly known as: SINGULAIR Take 10 mg by mouth every evening.   oxyCODONE-acetaminophen 10-325 MG tablet Commonly known as: PERCOCET Take 1 tablet by mouth 3 (three) times daily as needed for pain.   pantoprazole 40 MG tablet Commonly known as: PROTONIX Take 1 tablet (40 mg total) by mouth daily.   potassium chloride SA 20 MEQ tablet Commonly known as: KLOR-CON Take 1 tablet (20 mEq total) by mouth 2 (two) times daily. Start taking on: March 09, 2021 What changed:   how much to take  when to take this   rosuvastatin 40 MG tablet Commonly known as: CRESTOR Take 1 tablet (40 mg total) by mouth daily.   Sinus Wash Squeeze Bottle 2300-700 MG Kit Generic drug: Sodium Chloride-Sodium Bicarb See admin instructions.   Spiriva Respimat 1.25 MCG/ACT Aers Generic drug: Tiotropium Bromide Monohydrate Inhale 1.25 mcg into the lungs daily.   tiZANidine 2 MG tablet Commonly known as: ZANAFLEX TAKE 1 TABLET BY MOUTH EVERY 6 HOURS AS NEEDED FOR MUSCLE SPASMS.       Follow-up Information    Biagio Borg, MD. Schedule an appointment as soon as possible for a visit in 2 week(s).   Specialties: Internal Medicine, Radiology Contact information: [place] [address] [phone]        Lelon Perla, MD .   Specialty: Cardiology Contact information: [address] [place] [address] [place] [address] [phone]              No Known  Allergies  Procedures/Studies: US Abdomen Complete  Result Date: 03/06/2021 CLINICAL DATA:  Elevated LFTs.  Acute renal insufficiency. EXAM: ABDOMEN ULTRASOUND COMPLETE COMPARISON:  Ultrasound 07/30/2005. FINDINGS: Gallbladder: Gallstone again noted. Gallstone measures 2 cm. Gallbladder wall thickness 1.4 mm. Negative Murphy sign. Common bile duct: Diameter: 5.0 mm Liver: No focal lesion identified. Within normal limits in parenchymal echogenicity. Portal vein is patent on color Doppler imaging with normal direction of blood flow towards the liver. IVC: No abnormality visualized. Pancreas: Mild pancreatic duct dilatation again noted. Pancreatic duct diameter is 3.2 mm. No pancreatic mass identified. Spleen: Size and appearance within normal limits. Right Kidney: Length: 11.6 cm. Echogenicity within normal limits. No mass or hydronephrosis visualized. Left Kidney: Length: 1 cm. Echogenicity within normal limits. No mass or hydronephrosis visualized. Abdominal aorta: Abdominal aortic diameter 2.9 cm. Other findings: None. IMPRESSION: 1. Large gallstone again noted. Gallstone measures 2 cm. No evidence of cholecystitis. No evidence of biliary distention. Liver appears normal. 2. Mild pancreatic duct dilatation again noted. Pancreatic duct diameter is 3.2 mm. No pancreatic mass identified. Electronically Signed   By:  [place]   On: 03/06/2021 06:35     Subjective: Eager to go home  Discharge Exam: Vitals:   03/07/21 2057 03/08/21 0530  BP: 138/77 (!) 142/73  Pulse: (!) 52 (!) 43  Resp: 20 20  Temp: 98.3 F (36.8 C) 97.8 F (36.6 C)  SpO2: 100% 100%   Vitals:   03/07/21 0500 03/07/21 1252 03/07/21 2057 03/08/21 0530  BP:  120/76 138/77 (!) 142/73  Pulse:  78 (!) 52 (!) 43  Resp:  $Remo'20 20 20  'VHfjf$ Temp:  98.4 F (36.9 C) 98.3 F (36.8 C) 97.8 F (36.6 C)  TempSrc:  Oral Oral Oral  SpO2:  99% 100% 100%  Weight: 98 kg     Height:        General: Pt is alert, awake, not in acute  distress Cardiovascular: RRR, S1/S2 +, no rubs, no gallops Respiratory: CTA bilaterally, no wheezing, no rhonchi Abdominal: Soft, NT, ND, bowel sounds + Extremities: no edema, no cyanosis   The results of significant diagnostics from this hospitalization (including imaging, microbiology, ancillary and laboratory) are listed below for reference.     Microbiology: Recent Results (from the past 240 hour(s))  SARS CORONAVIRUS 2 (TAT 6-24 HRS) Nasopharyngeal Nasopharyngeal Swab     Status: None   Collection Time: 03/05/21  4:30 PM   Specimen: Nasopharyngeal Swab  Result Value Ref Range Status   SARS Coronavirus 2 NEGATIVE NEGATIVE Final    Comment: (NOTE) SARS-CoV-2 target nucleic acids are NOT DETECTED.  The SARS-CoV-2 RNA is generally detectable in upper and lower respiratory specimens during the acute phase of infection. Negative results do not preclude SARS-CoV-2 infection, do not rule out co-infections with other pathogens, and should not be used as the sole basis for treatment or other patient management decisions. Negative results must be combined with clinical observations, patient history, and epidemiological information. The expected result is Negative.  Fact Sheet for Patients: SugarRoll.be  Fact Sheet for Healthcare Providers: https://www.woods-mathews.com/  This test is not yet approved or cleared by the [place] FDA and  has been authorized for detection and/or diagnosis of SARS-CoV-2 by FDA under an Emergency Use Authorization (EUA). This EUA will remain  in effect (meaning this test can be used) for the duration of the COVID-19 declaration under Se ction 564(b)(1) of the Act, 21 U.S.C. section 360bbb-3(b)(1), unless the authorization is terminated or revoked sooner.  Performed at [facility], [place] [address], [place], [place] [address]      Labs: BNP (last 3 results) No results for input(s): BNP in the  last 8760 hours. Basic Metabolic Panel: Recent Labs  Lab 03/05/21 1409 03/05/21 2258 03/06/21 0348 03/06/21 0946 03/06/21 2148 03/07/21 0208 03/07/21 0545 03/07/21 1358 03/08/21 0420  NA 141  --  142  --   --  142  --  142 142  K <2.0* <2.0* <2.0*   < > 3.5 3.4* 3.3* 3.7 3.8  CL 78*  --  89*  --   --  101  --  105 109  CO2 50*  --  40*  --   --  32  --  27 26  GLUCOSE 135*  --  104*  --   --  97  --  95 99  BUN 16  --  14  --   --  8  --  7* 9  CREATININE 1.77*  --  1.23*  --   --  1.07*  --  1.10* 1.07*  CALCIUM 9.4  --  8.4*  --   --  8.0*  --  8.4* 8.4*  MG 3.3*  --   --   --   --  2.5*  --   --   --   PHOS  --  2.3*  --   --   --   --   --   --   --    < > = values in this interval not displayed.   Liver Function Tests: Recent Labs  Lab 03/04/21 1602 03/05/21 1409 03/06/21 0348 03/07/21 1358 03/08/21 0420  AST 84* 100* 62* 41 30  ALT 41* 47* 34 33 29  ALKPHOS 67 69 54 58 52  BILITOT 0.8 0.6 0.8 0.4 0.5  PROT 6.5 7.2 5.7* 6.3* 5.8*  ALBUMIN 3.6 3.8 2.8* 3.0* 2.7*   Recent Labs  Lab 03/05/21 1409  LIPASE 27   No results for input(s): AMMONIA in the last 168 hours. CBC: Recent Labs  Lab 03/04/21 1602 03/05/21 1409 03/06/21 0348  WBC 5.0 4.4 3.6*  NEUTROABS 3.0 2.7  --   HGB 11.4* 10.9* 9.9*  HCT 34.8* 35.8* 33.0*  MCV 82.6 88.8 89.9  PLT 220.0 216 175   Cardiac Enzymes: No results for input(s): CKTOTAL, CKMB, CKMBINDEX, TROPONINI in the last 168 hours. BNP: Invalid input(s): POCBNP CBG: No results for input(s): GLUCAP in the last 168 hours. D-Dimer No results for input(s): DDIMER in the last 72 hours. Hgb A1c No results for input(s): HGBA1C in the last 72 hours. Lipid Profile No results for input(s): CHOL, HDL, LDLCALC, TRIG, CHOLHDL, LDLDIRECT in the last 72 hours. Thyroid function studies No results for input(s): TSH, T4TOTAL, T3FREE, THYROIDAB in the last 72 hours.  Invalid input(s): FREET3 Anemia work up No results for input(s):  VITAMINB12, FOLATE, FERRITIN, TIBC, IRON, RETICCTPCT in the last 72 hours. Urinalysis    Component Value Date/Time   COLORURINE YELLOW 03/04/2021 1602   APPEARANCEUR [place] Cloud[place] (A) 03/04/2021 1602   LABSPEC 1.010 03/04/2021 1602   PHURINE 6.0 03/04/2021 1602   GLUCOSEU NEGATIVE 03/04/2021 1602   HGBUR LARGE (A) 03/04/2021 1602   BILIRUBINUR NEGATIVE 03/04/2021 1602   KETONESUR NEGATIVE 03/04/2021 1602   PROTEINUR NEGATIVE 05/28/2011 1020   UROBILINOGEN 0.2 03/04/2021 1602   NITRITE NEGATIVE 03/04/2021 1602   LEUKOCYTESUR NEGATIVE 03/04/2021 1602   Sepsis Labs Invalid input(s): PROCALCITONIN,  WBC,  LACTICIDVEN Microbiology Recent Results (from the past 240 hour(s))  SARS CORONAVIRUS 2 (TAT 6-24 HRS) Nasopharyngeal Nasopharyngeal Swab     Status: None   Collection Time: 03/05/21  4:30 PM   Specimen: Nasopharyngeal Swab  Result Value Ref Range Status   SARS Coronavirus 2 NEGATIVE NEGATIVE Final    Comment: (NOTE) SARS-CoV-2 target nucleic acids are NOT DETECTED.  The SARS-CoV-2 RNA is generally detectable in upper and lower respiratory specimens during the acute phase of infection. Negative results do not preclude SARS-CoV-2 infection, do not rule out co-infections with other pathogens, and should not be used as the sole basis for treatment or other patient management decisions. Negative results must be combined with clinical observations, patient history, and epidemiological information. The expected result is Negative.  Fact Sheet for Patients: SugarRoll.be  Fact Sheet for Healthcare Providers: https://www.woods-mathews.com/  This test is not yet approved or cleared by the [place] FDA and  has been authorized for detection and/or diagnosis of SARS-CoV-2 by FDA under an Emergency Use Authorization (EUA). This EUA will remain  in effect (meaning this test can  be used) for the duration of the COVID-19 declaration under Se ction  564(b)(1) of the Act, 21 U.S.C. section 360bbb-3(b)(1), unless the authorization is terminated or revoked sooner.  Performed at [facility], [place] [address], [place], [place] [address]    Time spent: 30 min  SIGNED:   Marylu Lund, MD  [facility] 03/08/2021, 11:51 AM  If 7PM-7AM, please contact night-coverage

## 2021-03-08 NOTE — TOC Transition Note (Signed)
Transition of Care [facility]) - CM/SW Discharge Note   Patient Details  Name: [name] MRN: [id number] Date of Birth: 11/01/1941  [facility] CM/SW Contact:  Ross Ludwig, LCSW Phone Number: 03/08/2021, 1:03 PM   Clinical Narrative:     CSW spoke to patient, and offered choice for home health equipment and home health agency.  Per patient she did not have a preference.  CSW contacted [employer] who can deliver the walker today before she leaves, and [facility] can accept patient for home health.  CSW to sign off, patient did not have any other questions or needs.  Final next level of care: [place] Barriers to Discharge: Barriers Resolved   Patient Goals and CMS Choice Patient states their goals for this hospitalization and ongoing recovery are:: To go home with home health PT CMS Medicare.gov Compare Post Acute Care list provided to:: Patient Choice offered to / list presented to : Patient  Discharge Placement                       Discharge Plan and Services                DME Arranged: Walker rolling DME Agency: Other - Comment [facility]) Date DME Agency Contacted: 03/08/21 Time DME Agency Contacted: [phone] Representative spoke with at DME Agency: Brenton Grills HH Arranged: PT [place]: [place] Date [place]: 03/08/21 Time [place]: 1303 Representative spoke with at [place]: [place] ([place]) Interventions     Readmission Risk Interventions No flowsheet data found.

## 2021-03-09 ENCOUNTER — Encounter: Payer: Self-pay | Admitting: Internal Medicine

## 2021-03-09 NOTE — Assessment & Plan Note (Addendum)
?   Volume related, for labs as ordered, hold lasix for 2 days and f/u in this office

## 2021-03-09 NOTE — Assessment & Plan Note (Signed)
No overt bleeding, for cbc with labs,  to f/u any worsening symptoms or concerns

## 2021-03-09 NOTE — Assessment & Plan Note (Signed)
Exam o/w benign, for ua with labs

## 2021-03-09 NOTE — Assessment & Plan Note (Signed)
BP Readings from Last 3 Encounters:  03/08/21 137/67  03/04/21 114/76  01/09/21 122/76   Stable, pt to continue medical treatment  lasix

## 2021-03-11 ENCOUNTER — Telehealth: Payer: Self-pay | Admitting: Cardiology

## 2021-03-13 ENCOUNTER — Encounter: Payer: Self-pay | Admitting: Internal Medicine

## 2021-03-13 ENCOUNTER — Other Ambulatory Visit: Payer: Self-pay

## 2021-03-13 ENCOUNTER — Ambulatory Visit: Payer: BC Managed Care – PPO | Admitting: Internal Medicine

## 2021-03-13 VITALS — BP 120/78 | HR 49 | Ht 64.0 in | Wt 208.0 lb

## 2021-03-13 DIAGNOSIS — E876 Hypokalemia: Secondary | ICD-10-CM

## 2021-03-13 DIAGNOSIS — N179 Acute kidney failure, unspecified: Secondary | ICD-10-CM | POA: Insufficient documentation

## 2021-03-13 DIAGNOSIS — E559 Vitamin D deficiency, unspecified: Secondary | ICD-10-CM | POA: Diagnosis not present

## 2021-03-13 DIAGNOSIS — R413 Other amnesia: Secondary | ICD-10-CM | POA: Diagnosis not present

## 2021-03-13 DIAGNOSIS — I10 Essential (primary) hypertension: Secondary | ICD-10-CM

## 2021-03-13 DIAGNOSIS — F419 Anxiety disorder, unspecified: Secondary | ICD-10-CM

## 2021-03-13 LAB — CBC WITH DIFFERENTIAL/PLATELET
Basophils Absolute: 0 10*3/uL (ref 0.0–0.1)
Basophils Relative: 1 % (ref 0.0–3.0)
Eosinophils Absolute: 0.1 10*3/uL (ref 0.0–0.7)
Eosinophils Relative: 3 % (ref 0.0–5.0)
HCT: 33.2 % — ABNORMAL LOW (ref 36.0–46.0)
Hemoglobin: 10.8 g/dL — ABNORMAL LOW (ref 12.0–15.0)
Lymphocytes Relative: 27.1 % (ref 12.0–46.0)
Lymphs Abs: 0.9 10*3/uL (ref 0.7–4.0)
MCHC: 32.6 g/dL (ref 30.0–36.0)
MCV: 83.8 fl (ref 78.0–100.0)
Monocytes Absolute: 0.3 10*3/uL (ref 0.1–1.0)
Monocytes Relative: 8.5 % (ref 3.0–12.0)
Neutro Abs: 1.9 10*3/uL (ref 1.4–7.7)
Neutrophils Relative %: 60.4 % (ref 43.0–77.0)
Platelets: 163 10*3/uL (ref 150.0–400.0)
RBC: 3.97 Mil/uL (ref 3.87–5.11)
RDW: 15.6 % — ABNORMAL HIGH (ref 11.5–15.5)
WBC: 3.2 10*3/uL — ABNORMAL LOW (ref 4.0–10.5)

## 2021-03-13 LAB — BASIC METABOLIC PANEL
BUN: 12 mg/dL (ref 6–23)
CO2: 27 mEq/L (ref 19–32)
Calcium: 9.3 mg/dL (ref 8.4–10.5)
Chloride: 106 mEq/L (ref 96–112)
Creatinine, Ser: 1.17 mg/dL (ref 0.40–1.20)
GFR: 44.81 mL/min — ABNORMAL LOW (ref >60.00)
Glucose, Bld: 73 mg/dL (ref 70–99)
Potassium: 4.6 mEq/L (ref 3.5–5.1)
Sodium: 140 mEq/L (ref 135–145)

## 2021-03-13 LAB — VITAMIN D 25 HYDROXY (VIT D DEFICIENCY, FRACTURES): VITD: 45.09 ng/mL (ref 30.00–100.00)

## 2021-03-13 MED ORDER — CITALOPRAM HYDROBROMIDE 10 MG PO TABS
10.0000 mg | ORAL_TABLET | Freq: Every day | ORAL | 3 refills | Status: DC
Start: 2021-03-13 — End: 2021-05-07

## 2021-03-13 MED ORDER — DONEPEZIL HCL 5 MG PO TBDP: 5.0000 mg | ORAL_TABLET | Freq: Every day | ORAL | 3 refills | Status: DC

## 2021-03-13 NOTE — Progress Notes (Signed)
Patient ID: [name], female   DOB: [birth year], 80 y.o.   MRN: [id number]        Chief Complaint: follow up post hospn       HPI:  [name] is a 80 y.o. female here after hospn apr 5 - 8 after finding critically low K requiring inpatient parenteral tx, encouraged for improved po intact, K home supplement increased and to f/u here for f/u K lab.  Also had AKI with cr 1.77 improved to 0.7 at d/c after IVF and initially holding lasix.  Abd u/s neg for renal.  Post hospn doing well with much less fatigue, good med compliance including lasix and K and diet, and Pt denies chest pain, increased sob or doe, wheezing, orthopnea, PND, increased LE swelling, palpitations, dizziness or syncope.   Pt denies polydipsia, polyuria, Denies new focal neuro s/s.   Pt denies fever, wt loss, night sweats, loss of appetite, or other constitutional symptoms  Is due for lab f/u.  Family with her also brings up new worsening memory issue over the past 6 mo with repeated questions and ST memory issues on regular basis, much of which pt denies and is not aware of.  Denies worsening depressive symptoms, suicidal ideation, or panic; has ongoing anxiety, increased recently it seems and is asking for tx   Wt Readings from Last 3 Encounters:  03/13/21 208 lb (94.3 kg)  03/07/21 216 lb (98 kg)  03/04/21 200 lb (90.7 kg)   BP Readings from Last 3 Encounters:  03/14/21 (!) 169/88  03/13/21 120/78  03/08/21 137/67         Past Medical History:  Diagnosis Date  . Allergic rhinitis   . Arnold-Chiari malformation (HCC)   . Asthma   . CAD (coronary artery disease)   . COPD (chronic obstructive pulmonary disease) (HCC)   . Coughing    coughing since last 01-22-2019 , started on cefdinir bid x10 days, has 3 pills left today , reports she feels mcuh better , still coughing copiuus amonts of thick white sputum , denies fever nor chills, nor body aches .   Marland Kitchen DJD (degenerative joint disease)   . GERD (gastroesophageal reflux  disease)   . History of absence seizures    last confirmed seizure age 53    . Hx of colonic polyps   . Hypercholesteremia   . Hypertension   . Obesity   . Positive PPD   . Reflex sympathetic dystrophy   . Varicose veins   . Venous insufficiency    Past Surgical History:  Procedure Laterality Date  . ABDOMINAL HYSTERECTOMY    . cspine surgery  1993   for arnold-chiari malformation  . JOINT REPLACEMENT  06/05/11   Left total knee replacement  . right knee arthroscopy  12/2007   Dr. Shelle Iron  . right total knee replacement  05/2008   Dr. Shelle Iron  . TOTAL KNEE REVISION Left 02/03/2019   Procedure: LEFT TOTAL KNEE REVISION;  Surgeon: Samson Frederic, MD;  Location: [facility];  Service: Orthopedics;  Laterality: Left;    reports that she quit smoking about 9 years ago. Her smoking use included cigarettes. She has a 3.60 pack-year smoking history. She has never used smokeless tobacco. She reports that she does not drink alcohol and does not use drugs. family history includes Clotting disorder in her father; Heart disease in her mother; Stroke in her father and mother. No Known Allergies Current Outpatient Medications on File Prior to Visit  Medication Sig Dispense Refill  . albuterol (PROVENTIL HFA;VENTOLIN HFA) 108 (90 BASE) MCG/ACT inhaler Inhale 2 puffs into the lungs every 4 (four) hours as needed. For wheeze or shortness of breath (Patient taking differently: Inhale 2 puffs into the lungs every 4 (four) hours as needed for wheezing or shortness of breath.) 1 Inhaler 6  . alendronate (FOSAMAX) 70 MG tablet TAKE 1 TABLET (70 MG TOTAL) BY MOUTH EVERY 7 (SEVEN) DAYS. TAKE WITH A FULL GLASS OF WATER ON AN EMPTY STOMACH. 12 tablet 1  . aspirin 81 MG tablet Take 81 mg by mouth daily.    Marland Kitchen azelastine (ASTELIN) 0.1 % nasal spray Place 1 spray into both nostrils daily as needed for rhinitis or allergies.     . [employer] D3 50 MCG (2000 UT) CAPS Take 1 capsule by mouth daily.    Marland Kitchen EPINEPHrine 0.3 mg/0.3 mL IJ  SOAJ injection Inject 0.3 mg into the muscle as needed for anaphylaxis.    . famotidine (PEPCID) 20 MG tablet Take 20 mg by mouth 2 (two) times daily.    . ferrous sulfate 325 (65 FE) MG EC tablet Take 325 mg by mouth daily.    . fluticasone (FLONASE) 50 MCG/ACT nasal spray Place 1 spray into both nostrils 2 (two) times daily as needed for allergies or rhinitis.    . furosemide (LASIX) 40 MG tablet Take 1 tablet (40 mg total) by mouth daily. 30 tablet 0  . meloxicam (MOBIC) 15 MG tablet TAKE 1 TABLET BY MOUTH EVERY DAY AS NEEDED FOR PAIN 30 tablet 5  . montelukast (SINGULAIR) 10 MG tablet Take 10 mg by mouth every evening.    Marland Kitchen oxyCODONE-acetaminophen (PERCOCET) 10-325 MG tablet Take 1 tablet by mouth 3 (three) times daily as needed for pain.    . pantoprazole (PROTONIX) 40 MG tablet Take 1 tablet (40 mg total) by mouth daily. 90 tablet 3  . potassium chloride SA (KLOR-CON) 20 MEQ tablet Take 1 tablet (20 mEq total) by mouth 2 (two) times daily. (Patient taking differently: Take 20 mEq by mouth 3 (three) times daily.) 60 tablet 0  . rosuvastatin (CRESTOR) 40 MG tablet Take 1 tablet (40 mg total) by mouth daily. 90 tablet 3  . Sodium Chloride-Sodium Bicarb (SINUS [place] SQUEEZE BOTTLE) 2300-700 MG KIT See admin instructions.    . Tiotropium Bromide Monohydrate (SPIRIVA RESPIMAT) 1.25 MCG/ACT AERS Inhale 1.25 mcg into the lungs daily.    Marland Kitchen tiZANidine (ZANAFLEX) 2 MG tablet TAKE 1 TABLET BY MOUTH EVERY 6 HOURS AS NEEDED FOR MUSCLE SPASMS. 40 tablet 1  . budesonide-formoterol (SYMBICORT) 160-4.5 MCG/ACT inhaler      No current facility-administered medications on file prior to visit.        ROS:  All others reviewed and negative.  Objective        PE:  BP 120/78 (BP Location: Left Arm, Patient Position: Sitting, Cuff Size: Large)   Pulse (!) 49   Ht $R'5\' 4"'AI$  (1.626 m)   Wt 208 lb (94.3 kg)   SpO2 95%   BMI 35.70 kg/m                 Constitutional: Pt appears in NAD               HENT: Head:  NCAT.                Right Ear: External ear normal.  Left Ear: External ear normal.                Eyes: . Pupils are equal, round, and reactive to light. Conjunctivae and EOM are normal               Nose: without d/c or deformity               Neck: Neck supple. Gross normal ROM               Cardiovascular: Normal rate and regular rhythm.                 Pulmonary/Chest: Effort normal and breath sounds without rales or wheezing.                Abd:  Soft, NT, ND, + BS, no organomegaly               Neurological: Pt is alert. At baseline orientation, motor grossly intact               Skin: Skin is warm. No rashes, no other new lesions, LE edema - trace bilat               Psychiatric: Pt behavior is normal without agitation , mild nervous  Micro: none  Cardiac tracings I have personally interpreted today:  none  Pertinent Radiological findings (summarize): none   Lab Results  Component Value Date   WBC 3.0 (L) 03/14/2021   HGB 10.7 (L) 03/14/2021   HCT 35.0 (L) 03/14/2021   PLT 177 03/14/2021   GLUCOSE 87 03/14/2021   CHOL 159 11/07/2020   TRIG 83.0 11/07/2020   HDL 59.60 11/07/2020   LDLCALC 83 11/07/2020   ALT 17 03/14/2021   AST 22 03/14/2021   NA 139 03/14/2021   K 4.7 03/14/2021   CL 106 03/14/2021   CREATININE 1.12 (H) 03/14/2021   BUN 8 03/14/2021   CO2 25 03/14/2021   TSH 0.88 03/04/2021   INR 1.14 06/16/2011   HGBA1C 5.7 11/07/2020   Assessment/Plan:  [name] is a 79 y.o. Black or African American [2] female with  has a past medical history of Allergic rhinitis, Arnold-Chiari malformation ([place]), Asthma, CAD (coronary artery disease), COPD (chronic obstructive pulmonary disease) (HCC), Coughing, DJD (degenerative joint disease), GERD (gastroesophageal reflux disease), History of absence seizures, colonic polyps, Hypercholesteremia, Hypertension, Obesity, Positive PPD, Reflex sympathetic dystrophy, Varicose veins, and Venous  insufficiency.  Memory loss D/w pt, most likely at least mild cognitive impairment, for MRI head (after June 1 per pt reqeust) and aricept 5 qd  Hypokalemia Also for lab f/u, cont current lasix and K  Current Outpatient Medications (Endocrine & Metabolic):  .  alendronate (FOSAMAX) 70 MG tablet, TAKE 1 TABLET (70 MG TOTAL) BY MOUTH EVERY 7 (SEVEN) DAYS. TAKE WITH A FULL GLASS OF WATER ON AN EMPTY STOMACH.  Current Outpatient Medications (Cardiovascular):  Marland Kitchen  EPINEPHrine 0.3 mg/0.3 mL IJ SOAJ injection, Inject 0.3 mg into the muscle as needed for anaphylaxis. .  furosemide (LASIX) 40 MG tablet, Take 1 tablet (40 mg total) by mouth daily. .  rosuvastatin (CRESTOR) 40 MG tablet, Take 1 tablet (40 mg total) by mouth daily.  Current Outpatient Medications (Respiratory):  .  albuterol (PROVENTIL HFA;VENTOLIN HFA) 108 (90 BASE) MCG/ACT inhaler, Inhale 2 puffs into the lungs every 4 (four) hours as needed. For wheeze or shortness of breath (Patient taking differently: Inhale 2 puffs into the lungs every 4 (four)  hours as needed for wheezing or shortness of breath.) .  azelastine (ASTELIN) 0.1 % nasal spray, Place 1 spray into both nostrils daily as needed for rhinitis or allergies.  .  fluticasone (FLONASE) 50 MCG/ACT nasal spray, Place 1 spray into both nostrils 2 (two) times daily as needed for allergies or rhinitis. Marland Kitchen  montelukast (SINGULAIR) 10 MG tablet, Take 10 mg by mouth every evening. .  Sodium Chloride-Sodium Bicarb (SINUS [place] SQUEEZE BOTTLE) 2300-700 MG KIT, See admin instructions. .  Tiotropium Bromide Monohydrate (SPIRIVA RESPIMAT) 1.25 MCG/ACT AERS, Inhale 1.25 mcg into the lungs daily. .  budesonide-formoterol (SYMBICORT) 160-4.5 MCG/ACT inhaler,   Current Outpatient Medications (Analgesics):  .  aspirin 81 MG tablet, Take 81 mg by mouth daily. .  meloxicam (MOBIC) 15 MG tablet, TAKE 1 TABLET BY MOUTH EVERY DAY AS NEEDED FOR PAIN .  oxyCODONE-acetaminophen (PERCOCET) 10-325 MG  tablet, Take 1 tablet by mouth 3 (three) times daily as needed for pain.  Current Outpatient Medications (Hematological):  .  ferrous sulfate 325 (65 FE) MG EC tablet, Take 325 mg by mouth daily.  Current Outpatient Medications (Other):  .  citalopram (CELEXA) 10 MG tablet, Take 1 tablet (10 mg total) by mouth daily. .  [employer]S D3 50 MCG (2000 UT) CAPS, Take 1 capsule by mouth daily. Marland Kitchen  donepezil (ARICEPT ODT) 5 MG disintegrating tablet, Take 1 tablet (5 mg total) by mouth at bedtime. .  famotidine (PEPCID) 20 MG tablet, Take 20 mg by mouth 2 (two) times daily. .  pantoprazole (PROTONIX) 40 MG tablet, Take 1 tablet (40 mg total) by mouth daily. .  potassium chloride SA (KLOR-CON) 20 MEQ tablet, Take 1 tablet (20 mEq total) by mouth 2 (two) times daily. (Patient taking differently: Take 20 mEq by mouth 3 (three) times daily.) .  tiZANidine (ZANAFLEX) 2 MG tablet, TAKE 1 TABLET BY MOUTH EVERY 6 HOURS AS NEEDED FOR MUSCLE SPASMS.   AKI (acute kidney injury) ([place]) resolved post d/c, for f/u lab today  Vitamin D deficiency Last vitamin D Lab Results  Component Value Date   VD25OH 45.09 03/13/2021   Stable, cont oral replacement   Essential hypertension  Stable, pt to continue medical treatment  - low salt diet, wt control   Anxiety Mild persistent recent worsening, for celexa 10 qd  Followup: Return in about 6 months (around 09/12/2021).  Cathlean Cower, MD 03/16/2021 10:45 PM [facility]

## 2021-03-13 NOTE — Patient Instructions (Addendum)
Your Mychart ID is [email or web address] and the Password is [id number]  Please take all new medication as prescribed - the celexa for stress, and aricept for memory  You will be contacted regarding the referral for: MRI for after June 1  Please continue all other medications as before, and refills have been done if requested.  Please have the pharmacy call with any other refills you may need.  Please continue your efforts at being more active, low cholesterol diet, and weight control.  Please keep your appointments with your specialists as you may have planned  Please go to the LAB at the blood drawing area for the tests to be done  You will be contacted by phone if any changes need to be made immediately.  Otherwise, you will receive a letter about your results with an explanation, but please check with MyChart first.  Please remember to sign up for MyChart if you have not done so, as this will be important to you in the future with finding out test results, communicating by private email, and scheduling acute appointments online when needed.

## 2021-03-14 ENCOUNTER — Emergency Department (HOSPITAL_COMMUNITY)
Admission: EM | Admit: 2021-03-14 | Discharge: 2021-03-14 | Disposition: A | Discharge status: Home / Self Care | Payer: BC Managed Care – PPO | Attending: Emergency Medicine | Admitting: Emergency Medicine

## 2021-03-14 ENCOUNTER — Telehealth: Payer: Self-pay | Admitting: Cardiology

## 2021-03-14 ENCOUNTER — Emergency Department (HOSPITAL_COMMUNITY): Payer: BC Managed Care – PPO

## 2021-03-14 DIAGNOSIS — Z7951 Long term (current) use of inhaled steroids: Secondary | ICD-10-CM | POA: Insufficient documentation

## 2021-03-14 DIAGNOSIS — R600 Localized edema: Secondary | ICD-10-CM | POA: Insufficient documentation

## 2021-03-14 DIAGNOSIS — J45909 Unspecified asthma, uncomplicated: Secondary | ICD-10-CM | POA: Insufficient documentation

## 2021-03-14 DIAGNOSIS — I2510 Atherosclerotic heart disease of native coronary artery without angina pectoris: Secondary | ICD-10-CM | POA: Insufficient documentation

## 2021-03-14 DIAGNOSIS — Z79899 Other long term (current) drug therapy: Secondary | ICD-10-CM | POA: Diagnosis not present

## 2021-03-14 DIAGNOSIS — Z7982 Long term (current) use of aspirin: Secondary | ICD-10-CM | POA: Insufficient documentation

## 2021-03-14 DIAGNOSIS — M7989 Other specified soft tissue disorders: Secondary | ICD-10-CM | POA: Diagnosis not present

## 2021-03-14 DIAGNOSIS — J441 Chronic obstructive pulmonary disease with (acute) exacerbation: Secondary | ICD-10-CM | POA: Insufficient documentation

## 2021-03-14 DIAGNOSIS — I509 Heart failure, unspecified: Secondary | ICD-10-CM

## 2021-03-14 DIAGNOSIS — Z87891 Personal history of nicotine dependence: Secondary | ICD-10-CM | POA: Insufficient documentation

## 2021-03-14 DIAGNOSIS — R609 Edema, unspecified: Secondary | ICD-10-CM

## 2021-03-14 DIAGNOSIS — I110 Hypertensive heart disease with heart failure: Secondary | ICD-10-CM | POA: Diagnosis not present

## 2021-03-14 DIAGNOSIS — I5032 Chronic diastolic (congestive) heart failure: Secondary | ICD-10-CM | POA: Diagnosis not present

## 2021-03-14 DIAGNOSIS — R001 Bradycardia, unspecified: Secondary | ICD-10-CM | POA: Diagnosis not present

## 2021-03-14 DIAGNOSIS — Z96651 Presence of right artificial knee joint: Secondary | ICD-10-CM | POA: Insufficient documentation

## 2021-03-14 LAB — CBC WITH DIFFERENTIAL/PLATELET
Abs Immature Granulocytes: 0 10*3/uL (ref 0.00–0.07)
Basophils Absolute: 0 10*3/uL (ref 0.0–0.1)
Basophils Relative: 1 %
Eosinophils Absolute: 0.1 10*3/uL (ref 0.0–0.5)
Eosinophils Relative: 3 %
HCT: 35 % — ABNORMAL LOW (ref 36.0–46.0)
Hemoglobin: 10.7 g/dL — ABNORMAL LOW (ref 12.0–15.0)
Immature Granulocytes: 0 %
Lymphocytes Relative: 32 %
Lymphs Abs: 1 10*3/uL (ref 0.7–4.0)
MCH: 27.5 pg (ref 26.0–34.0)
MCHC: 30.6 g/dL (ref 30.0–36.0)
MCV: 90 fL (ref 80.0–100.0)
Monocytes Absolute: 0.2 10*3/uL (ref 0.1–1.0)
Monocytes Relative: 8 %
Neutro Abs: 1.7 10*3/uL (ref 1.7–7.7)
Neutrophils Relative %: 56 %
Platelets: 177 10*3/uL (ref 150–400)
RBC: 3.89 MIL/uL (ref 3.87–5.11)
RDW: 14.8 % (ref 11.5–15.5)
WBC: 3 10*3/uL — ABNORMAL LOW (ref 4.0–10.5)
nRBC: 0 % (ref 0.0–0.2)

## 2021-03-14 LAB — COMPREHENSIVE METABOLIC PANEL
ALT: 17 U/L (ref 0–44)
AST: 22 U/L (ref 15–41)
Albumin: 3.2 g/dL — ABNORMAL LOW (ref 3.5–5.0)
Alkaline Phosphatase: 54 U/L (ref 38–126)
Anion gap: 8 (ref 5–15)
BUN: 8 mg/dL (ref 8–23)
CO2: 25 mmol/L (ref 22–32)
Calcium: 9.3 mg/dL (ref 8.9–10.3)
Chloride: 106 mmol/L (ref 98–111)
Creatinine, Ser: 1.12 mg/dL — ABNORMAL HIGH (ref 0.44–1.00)
GFR, Estimated: 50 mL/min — ABNORMAL LOW (ref >60)
Glucose, Bld: 87 mg/dL (ref 70–99)
Potassium: 4.7 mmol/L (ref 3.5–5.1)
Sodium: 139 mmol/L (ref 135–145)
Total Bilirubin: 0.8 mg/dL (ref 0.3–1.2)
Total Protein: 6.4 g/dL — ABNORMAL LOW (ref 6.5–8.1)

## 2021-03-14 LAB — BRAIN NATRIURETIC PEPTIDE: B Natriuretic Peptide: 61.3 pg/mL (ref 0.0–100.0)

## 2021-03-14 LAB — TROPONIN I (HIGH SENSITIVITY)
Troponin I (High Sensitivity): 9 ng/L (ref <18)
Troponin I (High Sensitivity): 9 ng/L (ref <18)

## 2021-03-14 MED ORDER — FUROSEMIDE 20 MG PO TABS
40.0000 mg | ORAL_TABLET | Freq: Once | ORAL | Status: AC
  Administered 2021-03-14: 40 mg via ORAL
  Filled 2021-03-14: qty 2

## 2021-03-14 NOTE — Telephone Encounter (Signed)
Pt c/o swelling: STAT is pt has developed SOB within 24 hours  1) How much weight have you gained and in what time span? No  2) If swelling, where is the swelling located? Bilateral Legs  3) Are you currently taking a fluid pill? Yes  4) Are you currently SOB? No  5) Do you have a log of your daily weights (if so, list)? No  6) Have you gained 3 pounds in a day or 5 pounds in a week? No  7) Have you traveled recently? No  Pt's legs have been swelling since Monday

## 2021-03-14 NOTE — ED Triage Notes (Signed)
bilateral leg pain and swelling x3 days  Denies increased shortness of breath

## 2021-03-14 NOTE — ED Notes (Signed)
Pt ambulated to and from restroom using walker.

## 2021-03-14 NOTE — ED Notes (Signed)
Patient verbalizes understanding of discharge instructions. Opportunity for questioning and answers were provided. Armband removed by staff, pt discharged from ED via wheelchair with walker in hand.

## 2021-03-14 NOTE — Discharge Instructions (Addendum)
Make sure you are wearing compression stockings and elevating your legs to help with the swelling. Continue taking your Lasix as prescribed. Follow-up with your cardiologist and your primary care provider. Return to the ER if you start to experience chest pain, shortness of breath, worsening swelling causing trouble walking;.

## 2021-03-14 NOTE — Telephone Encounter (Signed)
Spoke to pt. She report she's currently at the ER due to bilateral leg swelling since Monday. She report she decided to be seen because she can barely walk.   Nurse advised pt to continue to stay in ER for further evaluations and contact office if needing a sooner appointment after discharge. Pt verbalized understanding.

## 2021-03-14 NOTE — ED Triage Notes (Signed)
Emergency Medicine Provider Triage Evaluation Note  [name] , a 80 y.o. female  was evaluated in triage.  Pt complains of bilateral leg swelling and pain with walking .  Review of Systems  Positive: Leg swelling,  Negative: No CP, sob, fevers, chills    Physical Exam  There were no vitals taken for this visit. Gen:   Awake, no distress   HEENT:  Atraumatic  Resp:  Normal effort  Cardiac:  Normal rate  Abd:   Nondistended, nontender  MSK:   Moves extremities without difficulty, 3+ pitting edema bilaterally, no overlying erythema  Neuro:  Speech clear   Medical Decision Making  Medically screening exam initiated at 12:48 PM.  Appropriate orders placed.  [name] was informed that the remainder of the evaluation will be completed by another provider, this initial triage assessment does not replace that evaluation, and the importance of remaining in the ED until their evaluation is complete.  Clinical Impression  80 year old female with several days of bilateral leg swelling.  Reports taking "fluid pill".  Denies history of CHF but this is on her problem list.  Denies any chest pain or shortness of breath.  Has pain with walking.  Labs ordered, chest x-ray EKG.  Currently stable for further evaluation.   Garald Balding, PA-C 03/14/21 1251

## 2021-03-14 NOTE — ED Provider Notes (Signed)
[facility] EMERGENCY DEPARTMENT Provider Note   CSN: [id number] Arrival date & time: 03/14/21  1243     History Chief Complaint  Patient presents with  . Leg Pain    [name] is a 80 y.o. female with a past medical history of CAD, CHF, obesity presenting to the ED with a chief complaint of leg swelling.  States that she has noticed for the past few days that her bilateral lower extremity edema has worsened.  Also reports pain in her ankles associated with this.  States that she has been taking a fluid pill as prescribed.  A couple of months ago was decreased from 2 doses a day to 1 dose after being hypokalemic.  She has been taking this as she was told.  She has been elevating her legs and wearing compression stockings as well.  She denies any chest pain, shortness of breath, orthopnea, fever, cough, vomiting, numbness or weakness.  HPI     Past Medical History:  Diagnosis Date  . Allergic rhinitis   . Arnold-Chiari malformation ([place])   . Asthma   . CAD (coronary artery disease)   . COPD (chronic obstructive pulmonary disease) ([place])   . Coughing    coughing since last 01-22-2019 , started on cefdinir bid x10 days, has 3 pills left today , reports she feels mcuh better , still coughing copiuus amonts of thick white sputum , denies fever nor chills, nor body aches .   Marland Kitchen DJD (degenerative joint disease)   . GERD (gastroesophageal reflux disease)   . History of absence seizures    last confirmed seizure age 73    . Hx of colonic polyps   . Hypercholesteremia   . Hypertension   . Obesity   . Positive PPD   . Reflex sympathetic dystrophy   . Varicose veins   . Venous insufficiency     Patient Active Problem List   Diagnosis Date Noted  . AKI (acute kidney injury) ([place]) 03/13/2021  . Memory loss 03/13/2021  . Anxiety 03/13/2021  . Hypokalemia 03/05/2021  . Generalized weakness 03/04/2021  . Dizziness 03/04/2021  . Vitamin D deficiency 01/16/2021  .  Cough 12/13/2020  . 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  . Allergic rhinitis due to animal (cat) (dog) hair and dander 12/04/2020  . Allergic rhinitis due to pollen 12/04/2020  . Moderate persistent asthma, uncomplicated 25/85/2778  . Radiculopathy, lumbar region 11/19/2020  . Spondylolisthesis, lumbar region 11/19/2020  . Leg cramping 11/06/2020  . Degeneration of lumbar intervertebral disc 09/27/2020  . Degenerative spondylolisthesis 08/15/2020  . Pes anserinus bursitis of right knee 05/07/2020  . Ulcer of toe, left, limited to breakdown of skin ([place]) 08/31/2019  . Diastolic CHF, chronic ([place]) 08/31/2019  . PVD (peripheral vascular disease) ([place]) 08/31/2019  . Bilateral carpal tunnel syndrome 08/19/2019  . Posterior neck pain 08/19/2019  . Vertigo 04/08/2019  . Anemia 04/08/2019  . Stiffness of left knee 02/10/2019  . Failed total knee, left ([place]) 02/03/2019  . Failed total knee, left, initial encounter ([place]) 02/03/2019  . Carpal tunnel syndrome of left wrist 01/21/2019  . Pain of left hand 01/21/2019  . Preop exam for internal medicine 11/02/2018  . Left hand paresthesia 11/02/2018  . 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 Hyperopia with presbyopia, bilateral 06/29/2018  . Abdominal pain, lower 03/02/2018  . Diarrhea 03/02/2018  . Encounter for well adult exam with abnormal findings 11/14/2017  . Arnold-Chiari malformation ([place])   . Asthma   .  CAD (coronary artery disease)   . COPD (chronic obstructive pulmonary disease) ([place])   . DJD (degenerative joint disease)   . History of absence seizures   . Hx of colonic polyps   . Hypercholesteremia   . Positive PPD   . Venous insufficiency   . Pain in left lower leg 07/20/2017  . Cellulitis of left lower extremity 06/18/2017  . Bilateral lower extremity edema 04/30/2017  . Shingles 02/26/2017  . COPD exacerbation ([place]) 01/07/2017  . S/P TKR (total knee replacement), bilateral 01/07/2017  . Right arm pain  05/16/2015  . Generalized anxiety disorder 08/31/2014  . Varicose veins of lower extremities with other complications 60/63/0160  . Edema 08/07/2011  . MENORRHAGIA, POSTMENOPAUSAL 08/13/2010  . CIGARETTE SMOKER 06/11/2010  . CARDIAC MURMUR 11/02/2009  . BRADYCARDIA 11/01/2009  . CHEST PAIN 11/01/2009  . Allergic rhinitis 04/19/2009  . Chronic obstructive airway disease with asthma ([place]) 04/18/2009  . LEG CRAMPS, NOCTURNAL 11/02/2008  . Essential hypertension 01/17/2008  . COLONIC POLYPS 01/14/2008  . Body mass index (BMI) 36.0-36.9, adult 01/14/2008  . Reflex sympathetic dystrophy 01/14/2008  . Coronary atherosclerosis 01/14/2008  . GERD 01/14/2008  . Osteoarthritis 01/14/2008  . BACK PAIN, LUMBAR 01/14/2008  . SEIZURES, HX OF 01/14/2008    Past Surgical History:  Procedure Laterality Date  . ABDOMINAL HYSTERECTOMY    . cspine surgery  1993   for arnold-chiari malformation  . JOINT REPLACEMENT  06/05/11   Left total knee replacement  . right knee arthroscopy  12/2007   Dr. Tonita Cong  . right total knee replacement  05/2008   Dr. Tonita Cong  . TOTAL KNEE REVISION Left 02/03/2019   Procedure: LEFT TOTAL KNEE REVISION;  Surgeon: Rod Can, MD;  Location: [facility];  Service: Orthopedics;  Laterality: Left;     OB History   No obstetric history on file.     Family History  Problem Relation Age of Onset  . Heart disease Mother   . Stroke Mother   . Stroke Father   . Clotting disorder Father   . Colon cancer Neg Hx   . Stomach cancer Neg Hx     Social History   Tobacco Use  . Smoking status: Former Smoker    Packs/day: 0.30    Years: 12.00    Pack years: 3.60    Types: Cigarettes    Quit date: 01/02/2012    Years since quitting: 9.2  . Smokeless tobacco: Never Used  . Tobacco comment: 1 pack per week  Vaping Use  . Vaping Use: Never used  Substance Use Topics  . Alcohol use: No  . Drug use: No    Home Medications Prior to Admission medications   Medication Sig  Start Date End Date Taking? Authorizing Provider  albuterol (PROVENTIL HFA;VENTOLIN HFA) 108 (90 BASE) MCG/ACT inhaler Inhale 2 puffs into the lungs every 4 (four) hours as needed. For wheeze or shortness of breath Patient taking differently: Inhale 2 puffs into the lungs every 4 (four) hours as needed for wheezing or shortness of breath. 03/26/12   Noralee Space, MD  alendronate (FOSAMAX) 70 MG tablet TAKE 1 TABLET (70 MG TOTAL) BY MOUTH EVERY 7 (SEVEN) DAYS. TAKE WITH A FULL GLASS OF WATER ON AN EMPTY STOMACH. 12/07/19   Biagio Borg, MD  aspirin 81 MG tablet Take 81 mg by mouth daily.    [provider]  azelastine (ASTELIN) 0.1 % nasal spray Place 1 spray into both nostrils daily as needed for rhinitis  or allergies.  11/07/18   [provider]  budesonide-formoterol (SYMBICORT) 160-4.5 MCG/ACT inhaler     [provider]  citalopram (CELEXA) 10 MG tablet Take 1 tablet (10 mg total) by mouth daily. 03/13/21 03/13/22  Biagio Borg, MD  [employer] D3 50 MCG (2000 UT) CAPS Take 1 capsule by mouth daily. 01/09/21   [provider]  donepezil (ARICEPT ODT) 5 MG disintegrating tablet Take 1 tablet (5 mg total) by mouth at bedtime. 03/13/21   Biagio Borg, MD  EPINEPHrine 0.3 mg/0.3 mL IJ SOAJ injection Inject 0.3 mg into the muscle as needed for anaphylaxis.    [provider]  famotidine (PEPCID) 20 MG tablet Take 20 mg by mouth 2 (two) times daily. 01/07/21   [provider]  ferrous sulfate 325 (65 FE) MG EC tablet Take 325 mg by mouth daily.    [provider]  fluticasone (FLONASE) 50 MCG/ACT nasal spray Place 1 spray into both nostrils 2 (two) times daily as needed for allergies or rhinitis.    [provider]  furosemide (LASIX) 40 MG tablet Take 1 tablet (40 mg total) by mouth daily. 03/08/21 04/07/21  Donne Hazel, MD  meloxicam (MOBIC) 15 MG tablet TAKE 1 TABLET BY MOUTH EVERY DAY AS NEEDED FOR PAIN 09/26/20   Biagio Borg, MD   montelukast (SINGULAIR) 10 MG tablet Take 10 mg by mouth every evening.    [provider]  oxyCODONE-acetaminophen (PERCOCET) 10-325 MG tablet Take 1 tablet by mouth 3 (three) times daily as needed for pain. 02/05/21   [provider]  pantoprazole (PROTONIX) 40 MG tablet Take 1 tablet (40 mg total) by mouth daily. 01/09/21   Biagio Borg, MD  potassium chloride SA (KLOR-CON) 20 MEQ tablet Take 1 tablet (20 mEq total) by mouth 2 (two) times daily. Patient taking differently: Take 20 mEq by mouth 3 (three) times daily. 03/09/21 04/08/21  Donne Hazel, MD  rosuvastatin (CRESTOR) 40 MG tablet Take 1 tablet (40 mg total) by mouth daily. 12/24/20 03/24/21  Lelon Perla, MD  Sodium Chloride-Sodium Bicarb (SINUS [place] SQUEEZE BOTTLE) 2300-700 MG KIT See admin instructions.    [provider]  Tiotropium Bromide Monohydrate (SPIRIVA RESPIMAT) 1.25 MCG/ACT AERS Inhale 1.25 mcg into the lungs daily.    [provider]  tiZANidine (ZANAFLEX) 2 MG tablet TAKE 1 TABLET BY MOUTH EVERY 6 HOURS AS NEEDED FOR MUSCLE SPASMS. 12/27/20   Biagio Borg, MD    Allergies    Patient has no known allergies.  Review of Systems   Review of Systems  Constitutional: Negative for appetite change, chills and fever.  HENT: Negative for ear pain, rhinorrhea, sneezing and sore throat.   Eyes: Negative for photophobia and visual disturbance.  Respiratory: Negative for cough, chest tightness, shortness of breath and wheezing.   Cardiovascular: Positive for leg swelling. Negative for chest pain and palpitations.  Gastrointestinal: Negative for abdominal pain, blood in stool, constipation, diarrhea, nausea and vomiting.  Genitourinary: Negative for dysuria, hematuria and urgency.  Musculoskeletal: Negative for myalgias.  Skin: Negative for rash.  Neurological: Negative for dizziness, weakness and light-headedness.    Physical Exam Updated Vital Signs BP (!) 166/71 (BP Location: Left Arm)    Pulse (!) 41   Temp 98.3 F (36.8 C)   Resp 17   SpO2 100%   Physical Exam Vitals and nursing note reviewed.  Constitutional:      General: She is not in acute distress.  Appearance: She is well-developed.     Comments: Speaking complete sentences without difficulty.  HENT:     Head: Normocephalic and atraumatic.     Nose: Nose normal.  Eyes:     General: No scleral icterus.       Left eye: No discharge.     Conjunctiva/sclera: Conjunctivae normal.  Cardiovascular:     Rate and Rhythm: Regular rhythm. Bradycardia present.     Heart sounds: Normal heart sounds. No murmur heard. No friction rub. No gallop.   Pulmonary:     Effort: Pulmonary effort is normal. No respiratory distress.     Breath sounds: Normal breath sounds.  Abdominal:     General: Bowel sounds are normal. There is no distension.     Palpations: Abdomen is soft.     Tenderness: There is no abdominal tenderness. There is no guarding.  Musculoskeletal:        General: Normal range of motion.     Cervical back: Normal range of motion and neck supple.     Right lower leg: Edema present.     Left lower leg: Edema present.     Comments: 2+ pitting edema noted to bilateral lower extremities.  2+ DP pulse noted bilaterally.  Moving extremities without difficulty.  No calf tenderness noted bilaterally.  No erythema or warmth.  Skin:    General: Skin is warm and dry.     Findings: No rash.  Neurological:     Mental Status: She is alert.     Motor: No abnormal muscle tone.     Coordination: Coordination normal.     ED Results / Procedures / Treatments   Labs (all labs ordered are listed, but only abnormal results are displayed) Labs Reviewed  CBC WITH DIFFERENTIAL/PLATELET - Abnormal; Notable for the following components:      Result Value   WBC 3.0 (*)    Hemoglobin 10.7 (*)    HCT 35.0 (*)    All other components within normal limits  COMPREHENSIVE METABOLIC PANEL - Abnormal; Notable for the following  components:   Creatinine, Ser 1.12 (*)    Total Protein 6.4 (*)    Albumin 3.2 (*)    GFR, Estimated 50 (*)    All other components within normal limits  BRAIN NATRIURETIC PEPTIDE  TROPONIN I (HIGH SENSITIVITY)  TROPONIN I (HIGH SENSITIVITY)    EKG EKG Interpretation  Date/Time:  Thursday March 14 2021 12:52:12 EDT Ventricular Rate:  47 PR Interval:  122 QRS Duration: 78 QT Interval:  434 QTC Calculation: 384 R Axis:   2 Text Interpretation: Sinus bradycardia Minimal voltage criteria for LVH, may be normal variant ( R in aVL ) Cannot rule out Anterior infarct , age undetermined Abnormal ECG No significant change since last tracing Confirmed by Dorie Rank [phone]) on 03/14/2021 5:03:46 PM   Radiology DG Chest 2 View  Result Date: 03/14/2021 CLINICAL DATA:  Bilateral ankle swelling for the past 3 days. EXAM: CHEST - 2 VIEW COMPARISON:  Chest x-ray dated January 09, 2021. FINDINGS: Unchanged mild cardiomegaly. Normal pulmonary vascularity. No focal consolidation, pleural effusion, or pneumothorax. No acute osseous abnormality. IMPRESSION: No active cardiopulmonary disease. Electronically Signed   By: Titus Dubin M.D.   On: 03/14/2021 13:37    Procedures Procedures   Medications Ordered in ED Medications  furosemide (LASIX) tablet 40 mg (has no administration in time range)    ED Course  I have reviewed the triage vital signs and the nursing notes.  Pertinent labs & imaging results that were available during my care of the patient were reviewed by me and considered in my medical decision making (see chart for details).    MDM Rules/Calculators/A&P                          80 year old female with past medical history of CAD, CHF, obesity presenting to the ED with a chief complaint of leg swelling.  Noticed over the past 3 days that she has had worsening bilateral lower extremity edema despite taking her Lasix.  She was decreased from 2 doses of Lasix a day to 1 dose after  being hypokalemic couple months ago.  She denies any chest pain, shortness of breath, orthopnea, fever, cough, weakness or changes to skin.  On exam she does have pitting edema to bilateral lower extremities.  Equal and intact distal pulses noted bilaterally.  Normal sensation noted.  No calf tenderness noted.  She remains ambulatory.  No overlying skin changes.  Patient bradycardic here but she states that this is normal for her.  Chart review confirms this.  She sees Dr. [name] with cardiology.  EKG here shows sinus bradycardia, no changes from prior tracings.  Chest x-ray without any acute findings.  CBC is unremarkable.  CMP with normal potassium level here.  Troponin is negative x2.  BNP is normal here. Suspect this peripheral edema will be improved with elevation and compression stockings.  I have given her an additional dose of her Lasix here but she will need to follow-up with her cardiologist, elevate her legs.  This does not appear to be related to acute heart failure she is having no respiratory symptoms and her work-up is unremarkable.  Patient is agreeable to the plan.  She is requesting discharge home.  Return precautions given.   Patient is hemodynamically stable, in NAD, and able to ambulate in the ED. Evaluation does not show pathology that would require ongoing emergent intervention or inpatient treatment. I explained the diagnosis to the patient. Pain has been managed and has no complaints prior to discharge. Patient is comfortable with above plan and is stable for discharge at this time. All questions were answered prior to disposition. Strict return precautions for returning to the ED were discussed. Encouraged follow up with PCP.   An After Visit Summary was printed and given to the patient.   Portions of this note were generated with [employer]. Dictation errors may occur despite best attempts at proofreading.  Final Clinical Impression(s) / ED Diagnoses Final  diagnoses:  Peripheral edema    Rx / DC Orders ED Discharge Orders    None       Delia Heady, PA-C 03/14/21 2032    Lajean Saver, MD 03/15/21 [phone]

## 2021-03-14 NOTE — ED Notes (Signed)
EDP at bedside  

## 2021-03-15 ENCOUNTER — Telehealth: Payer: Self-pay | Admitting: Cardiology

## 2021-03-15 NOTE — Telephone Encounter (Signed)
Spoke with patient who states that she was seen in the ED yesterday for LE swelling, patient reports that she was given lasix, and was told to follow up with cardiology and to continue current medications.   Patient states that she currently feels well and better than she did yesterday, patient reports the swelling is better as well.   Patient scheduled to see Dr. [name] in June but would like a closer appointment. Appointment made for 5/10 with Almyra Deforest PA-C.   Advised patient to monitor weight and to contact office with weight gain of 3 or more pounds over night or 5 or more pounds in one week. Advised patient to elevate legs. Advised patient of ED precautions should new or worsening symptoms develop. Patient verbalized understanding.

## 2021-03-15 NOTE — Telephone Encounter (Signed)
Patient is calling back in regards to her ER, patient states they said everything is okay with her. They gave the patient lasic and told her to conitnue with her medicine.

## 2021-03-16 ENCOUNTER — Encounter: Payer: Self-pay | Admitting: Internal Medicine

## 2021-03-16 NOTE — Assessment & Plan Note (Signed)
Last vitamin D Lab Results  Component Value Date   VD25OH 45.09 03/13/2021   Stable, cont oral replacement

## 2021-03-16 NOTE — Assessment & Plan Note (Signed)
Mild persistent recent worsening, for celexa 10 qd

## 2021-03-16 NOTE — Assessment & Plan Note (Signed)
resolved post d/c, for f/u lab today

## 2021-03-16 NOTE — Assessment & Plan Note (Signed)
  Stable, pt to continue medical treatment  - low salt diet, wt control

## 2021-03-16 NOTE — Assessment & Plan Note (Signed)
Also for lab f/u, cont current lasix and K  Current Outpatient Medications (Endocrine & Metabolic):  .  alendronate (FOSAMAX) 70 MG tablet, TAKE 1 TABLET (70 MG TOTAL) BY MOUTH EVERY 7 (SEVEN) DAYS. TAKE WITH A FULL GLASS OF WATER ON AN EMPTY STOMACH.  Current Outpatient Medications (Cardiovascular):  Marland Kitchen  EPINEPHrine 0.3 mg/0.3 mL IJ SOAJ injection, Inject 0.3 mg into the muscle as needed for anaphylaxis. .  furosemide (LASIX) 40 MG tablet, Take 1 tablet (40 mg total) by mouth daily. .  rosuvastatin (CRESTOR) 40 MG tablet, Take 1 tablet (40 mg total) by mouth daily.  Current Outpatient Medications (Respiratory):  .  albuterol (PROVENTIL HFA;VENTOLIN HFA) 108 (90 BASE) MCG/ACT inhaler, Inhale 2 puffs into the lungs every 4 (four) hours as needed. For wheeze or shortness of breath (Patient taking differently: Inhale 2 puffs into the lungs every 4 (four) hours as needed for wheezing or shortness of breath.) .  azelastine (ASTELIN) 0.1 % nasal spray, Place 1 spray into both nostrils daily as needed for rhinitis or allergies.  .  fluticasone (FLONASE) 50 MCG/ACT nasal spray, Place 1 spray into both nostrils 2 (two) times daily as needed for allergies or rhinitis. Marland Kitchen  montelukast (SINGULAIR) 10 MG tablet, Take 10 mg by mouth every evening. .  Sodium Chloride-Sodium Bicarb (SINUS [place] SQUEEZE BOTTLE) 2300-700 MG KIT, See admin instructions. .  Tiotropium Bromide Monohydrate (SPIRIVA RESPIMAT) 1.25 MCG/ACT AERS, Inhale 1.25 mcg into the lungs daily. .  budesonide-formoterol (SYMBICORT) 160-4.5 MCG/ACT inhaler,   Current Outpatient Medications (Analgesics):  .  aspirin 81 MG tablet, Take 81 mg by mouth daily. .  meloxicam (MOBIC) 15 MG tablet, TAKE 1 TABLET BY MOUTH EVERY DAY AS NEEDED FOR PAIN .  oxyCODONE-acetaminophen (PERCOCET) 10-325 MG tablet, Take 1 tablet by mouth 3 (three) times daily as needed for pain.  Current Outpatient Medications (Hematological):  .  ferrous sulfate 325 (65 FE) MG EC  tablet, Take 325 mg by mouth daily.  Current Outpatient Medications (Other):  .  citalopram (CELEXA) 10 MG tablet, Take 1 tablet (10 mg total) by mouth daily. .  [employer]S D3 50 MCG (2000 UT) CAPS, Take 1 capsule by mouth daily. Marland Kitchen  donepezil (ARICEPT ODT) 5 MG disintegrating tablet, Take 1 tablet (5 mg total) by mouth at bedtime. .  famotidine (PEPCID) 20 MG tablet, Take 20 mg by mouth 2 (two) times daily. .  pantoprazole (PROTONIX) 40 MG tablet, Take 1 tablet (40 mg total) by mouth daily. .  potassium chloride SA (KLOR-CON) 20 MEQ tablet, Take 1 tablet (20 mEq total) by mouth 2 (two) times daily. (Patient taking differently: Take 20 mEq by mouth 3 (three) times daily.) .  tiZANidine (ZANAFLEX) 2 MG tablet, TAKE 1 TABLET BY MOUTH EVERY 6 HOURS AS NEEDED FOR MUSCLE SPASMS.

## 2021-03-16 NOTE — Assessment & Plan Note (Signed)
D/w pt, most likely at least mild cognitive impairment, for MRI head (after June 1 per pt reqeust) and aricept 5 qd

## 2021-03-30 ENCOUNTER — Other Ambulatory Visit: Payer: Self-pay | Admitting: Internal Medicine

## 2021-03-30 NOTE — Telephone Encounter (Signed)
Please refill as per office routine med refill policy (all routine meds refilled for 3 mo or monthly per pt preference up to one year from last visit, then month to month grace period for 3 mo, then further med refills will have to be denied)  

## 2021-04-01 MED ORDER — POTASSIUM CHLORIDE CRYS ER 20 MEQ PO TBCR: 20.0000 meq | EXTENDED_RELEASE_TABLET | Freq: Two times a day (BID) | ORAL | 5 refills | Status: DC

## 2021-04-09 ENCOUNTER — Encounter: Payer: Self-pay | Admitting: Physician Assistant

## 2021-04-09 ENCOUNTER — Ambulatory Visit: Payer: BC Managed Care – PPO | Admitting: Physician Assistant

## 2021-04-09 ENCOUNTER — Other Ambulatory Visit: Payer: Self-pay

## 2021-04-09 VITALS — BP 100/62 | HR 57 | Ht 63.0 in | Wt 209.0 lb

## 2021-04-09 DIAGNOSIS — R600 Localized edema: Secondary | ICD-10-CM | POA: Diagnosis not present

## 2021-04-09 DIAGNOSIS — E785 Hyperlipidemia, unspecified: Secondary | ICD-10-CM

## 2021-04-09 DIAGNOSIS — I2510 Atherosclerotic heart disease of native coronary artery without angina pectoris: Secondary | ICD-10-CM

## 2021-04-09 DIAGNOSIS — I10 Essential (primary) hypertension: Secondary | ICD-10-CM | POA: Diagnosis not present

## 2021-04-09 NOTE — Progress Notes (Signed)
Cardiology Office Note:    Date:  04/11/2021   ID:  [name], DOB [birth year], MRN [id number]  PCP:  Biagio Borg, MD    [place] Providers Cardiologist:  Kirk Ruths, MD {   Referring MD: Biagio Borg, MD   Chief Complaint  Patient presents with  . Follow-up    Seen for Dr. [name]    History of Present Illness:    [name] is a 80 y.o. female with a hx of CAD, COPD, GERD, hypertension, hyperlipidemia, obesity and history of venous insufficiency.  Previous cardiac catheterization in February 2005 showed mild irregularities in the left main, 40% proximal LAD, 30% distal LAD lesion, 30 to 40% proximal D1 lesion, 60% stenosis in the ostial LAD, nonobstructive disease in left circumflex artery and RCA.  EF was normal.  Myoview in January 2016 showed EF 58%, normal perfusion.  Echocardiogram in April 2019 showed normal LV function, mild LVH, grade 1 DD, mild right atrial and right ventricular enlargement with mild tricuspid regurgitation.  ABI in 2019 was normal.  Venous Doppler June 2021 showed no DVT.  Patient was last seen by Dr. [name] in January 2022 at which time she was doing well.  Patient was admitted with increasing fatigue in early April 2022 with nausea and vomiting.  Potassium was severely low at that time.  She also had AKI with creatinine up to 1.77, baseline creatinine has always been 0.7.  Lasix was initially held.  Abdominal ultrasound revealed normal kidney, incidental finding of large gallstones without obstruction.  Patient was given IV fluid with improvement in the symptoms.  Her diuretic was reportedly decreased.  Patient went to the ED on 03/14/2021 due to complaint of bilateral lower extremity edema.  Symptom improved after patient was given IV diuretic.  BNP 61.3.  Troponin normal.  Chest x-ray normal.  EKG obtained in the ED does show sinus bradycardia with heart rate 47 bpm, no significant ST-T wave changes.  She was discharged on higher dose of  diuretic.  Patient presents today for evaluation of swelling.  On exam, she does not have significant edema.  Her lungs is clear.  She denies any orthopnea or PND.  She denies any recent chest pain or worsening shortness of breath.  I recommended extra Lasix on an as-needed basis only.  She has scheduled follow-up next month.    Past Medical History:  Diagnosis Date  . Allergic rhinitis   . Arnold-Chiari malformation ([place])   . Asthma   . CAD (coronary artery disease)   . COPD (chronic obstructive pulmonary disease) ([place])   . Coughing    coughing since last 01-22-2019 , started on cefdinir bid x10 days, has 3 pills left today , reports she feels mcuh better , still coughing copiuus amonts of thick white sputum , denies fever nor chills, nor body aches .   Marland Kitchen DJD (degenerative joint disease)   . GERD (gastroesophageal reflux disease)   . History of absence seizures    last confirmed seizure age 28    . Hx of colonic polyps   . Hypercholesteremia   . Hypertension   . Obesity   . Positive PPD   . Reflex sympathetic dystrophy   . Varicose veins   . Venous insufficiency     Past Surgical History:  Procedure Laterality Date  . ABDOMINAL HYSTERECTOMY    . cspine surgery  1993   for arnold-chiari malformation  . JOINT REPLACEMENT  06/05/11   Left  total knee replacement  . right knee arthroscopy  12/2007   Dr. Tonita Cong  . right total knee replacement  05/2008   Dr. Tonita Cong  . TOTAL KNEE REVISION Left 02/03/2019   Procedure: LEFT TOTAL KNEE REVISION;  Surgeon: Rod Can, MD;  Location: [facility];  Service: Orthopedics;  Laterality: Left;    Current Medications: Current Meds  Medication Sig  . albuterol (PROVENTIL HFA;VENTOLIN HFA) 108 (90 BASE) MCG/ACT inhaler Inhale 2 puffs into the lungs every 4 (four) hours as needed. For wheeze or shortness of breath  . alendronate (FOSAMAX) 70 MG tablet TAKE 1 TABLET (70 MG TOTAL) BY MOUTH EVERY 7 (SEVEN) DAYS. TAKE WITH A FULL GLASS OF WATER ON AN  EMPTY STOMACH.  Marland Kitchen aspirin 81 MG tablet Take 81 mg by mouth daily.  Marland Kitchen azelastine (ASTELIN) 0.1 % nasal spray Place 1 spray into both nostrils daily as needed for rhinitis or allergies.   . budesonide-formoterol (SYMBICORT) 160-4.5 MCG/ACT inhaler   . [employer] D3 50 MCG (2000 UT) CAPS Take 1 capsule by mouth daily.  Marland Kitchen EPINEPHrine 0.3 mg/0.3 mL IJ SOAJ injection Inject 0.3 mg into the muscle as needed for anaphylaxis.  . famotidine (PEPCID) 20 MG tablet Take 20 mg by mouth 2 (two) times daily.  . ferrous sulfate 325 (65 FE) MG EC tablet Take 325 mg by mouth daily.  . fluticasone (FLONASE) 50 MCG/ACT nasal spray Place 1 spray into both nostrils 2 (two) times daily as needed for allergies or rhinitis.  . meloxicam (MOBIC) 15 MG tablet TAKE 1 TABLET BY MOUTH EVERY DAY AS NEEDED FOR PAIN  . montelukast (SINGULAIR) 10 MG tablet Take 10 mg by mouth every evening.  Marland Kitchen oxyCODONE-acetaminophen (PERCOCET) 10-325 MG tablet Take 1 tablet by mouth 3 (three) times daily as needed for pain.  . pantoprazole (PROTONIX) 40 MG tablet Take 1 tablet (40 mg total) by mouth daily.  . potassium chloride SA (KLOR-CON M20) 20 MEQ tablet Take 1 tablet (20 mEq total) by mouth 2 (two) times daily.  . rosuvastatin (CRESTOR) 40 MG tablet Take 1 tablet (40 mg total) by mouth daily.  . Sodium Chloride-Sodium Bicarb (SINUS [place] SQUEEZE BOTTLE) 2300-700 MG KIT See admin instructions.  . Tiotropium Bromide Monohydrate (SPIRIVA RESPIMAT) 1.25 MCG/ACT AERS Inhale 1.25 mcg into the lungs daily.  Marland Kitchen tiZANidine (ZANAFLEX) 2 MG tablet TAKE 1 TABLET BY MOUTH EVERY 6 HOURS AS NEEDED FOR MUSCLE SPASMS.     Allergies:   Patient has no known allergies.   Social History   Socioeconomic History  . Marital status: Widowed    Spouse name: Not on file  . Number of children: 2  . Years of education: 75  . Highest education level: Not on file  Occupational History  . Occupation: retired  Tobacco Use  . Smoking status: Former Smoker    Packs/day:  0.30    Years: 12.00    Pack years: 3.60    Types: Cigarettes    Quit date: 01/02/2012    Years since quitting: 9.2  . Smokeless tobacco: Never Used  . Tobacco comment: 1 pack per week  Vaping Use  . Vaping Use: Never used  Substance and Sexual Activity  . Alcohol use: No  . Drug use: No  . Sexual activity: Not on file  Other Topics Concern  . Not on file  Social History Narrative   Fun/Hobby: Playing cards and mingling.   Denies abuse and feels safe at home.    Social Determinants of Health  Financial Resource Strain: Not on file  Food Insecurity: Not on file  Transportation Needs: Not on file  Physical Activity: Not on file  Stress: Not on file  Social Connections: Not on file     Family History: The patient's family history includes Clotting disorder in her father; Heart disease in her mother; Stroke in her father and mother. There is no history of Colon cancer or Stomach cancer.  ROS:   Please see the history of present illness.     All other systems reviewed and are negative.  EKGs/Labs/Other Studies Reviewed:    The following studies were reviewed today:  Echo 03/26/2018 LV EF: 55% -  60%   -------------------------------------------------------------------  Indications:   R60.0 Bilateral lower extremity edema.   -------------------------------------------------------------------  History:  PMH: Acquired from the patient and from the patient&'s  chart. PMH: Asthma. CAD. COPD. Risk factors: Hypertension.  Hypercholesterolemia.   -------------------------------------------------------------------  Study Conclusions   - Left ventricle: The cavity size was normal. Wall thickness was  increased in a pattern of mild LVH. Systolic function was normal.  The estimated ejection fraction was in the range of 55% to 60%.  Wall motion was normal; there were no regional wall motion  abnormalities. Doppler parameters are consistent with abnormal   left ventricular relaxation (grade 1 diastolic dysfunction).  - Aortic valve: Mildly calcified annulus. Trileaflet.  - Mitral valve: Mildly thickened leaflets .  - Right ventricle: The cavity size was mildly dilated. Wall  thickness was normal. Systolic function was low normal.  - Right atrium: The atrium was mildly dilated.  - Tricuspid valve: There was mild regurgitation.  - Pulmonary arteries: PA peak pressure: 36 mm Hg (S).   EKG:  EKG is not ordered today.     Recent Labs: 03/04/2021: TSH 0.88 03/07/2021: Magnesium 2.5 03/14/2021: ALT 17; B Natriuretic Peptide 61.3; BUN 8; Creatinine, Ser 1.12; Hemoglobin 10.7; Platelets 177; Potassium 4.7; Sodium 139  Recent Lipid Panel    Component Value Date/Time   CHOL 159 11/07/2020 1015   CHOL 144 11/07/2019 1605   TRIG 83.0 11/07/2020 1015   HDL 59.60 11/07/2020 1015   HDL 68 11/07/2019 1605   CHOLHDL 3 11/07/2020 1015   VLDL 16.6 11/07/2020 1015   LDLCALC 83 11/07/2020 1015   LDLCALC 57 11/07/2019 1605     Risk Assessment/Calculations:       Physical Exam:    VS:  BP 100/62   Pulse (!) 57   Ht _0  (1.6 m)   Wt 209 lb (94.8 kg)   SpO2 97%   BMI 37.02 kg/m     Wt Readings from Last 3 Encounters:  04/09/21 209 lb (94.8 kg)  03/13/21 208 lb (94.3 kg)  03/07/21 216 lb (98 kg)     GEN:  Well nourished, well developed in no acute distress HEENT: Normal NECK: No JVD; No carotid bruits LYMPHATICS: No lymphadenopathy CARDIAC: RRR, no murmurs, rubs, gallops RESPIRATORY:  Clear to auscultation without rales, wheezing or rhonchi  ABDOMEN: Soft, non-tender, non-distended MUSCULOSKELETAL:  No edema; No deformity  SKIN: Warm and dry NEUROLOGIC:  Alert and oriented x 3 PSYCHIATRIC:  Normal affect   ASSESSMENT:    1. Leg edema   2. Coronary artery disease involving native coronary artery of native heart without angina pectoris   3. Essential hypertension   4. Hyperlipidemia LDL goal <70    PLAN:    In order of  problems listed above:  1. Leg edema: Recently seen in the hospital  for leg edema after her diuretic was reduced.  She appears to be euvolemic on current exam.  I recommended extra dose of Lasix on as-needed basis.  2. CAD: Denies any recent chest pain  3. Hypertension: Blood pressure stable on current therapy  4. Hyperlipidemia: On Crestor.        Medication Adjustments/Labs and Tests Ordered: Current medicines are reviewed at length with the patient today.  Concerns regarding medicines are outlined above.  No orders of the defined types were placed in this encounter.  No orders of the defined types were placed in this encounter.   Patient Instructions  Medication Instructions:  Take an Additional Lasix 40 mg (As needed for Swelling) *If you need a refill on your cardiac medications before your next appointment, please call your pharmacy*   Lab Work: No Labs If you have labs (blood work) drawn today and your tests are completely normal, you will receive your results only by: Marland Kitchen MyChart Message (if you have MyChart) OR . A paper copy in the mail If you have any lab test that is abnormal or we need to change your treatment, we will call you to review the results.   Testing/Procedures: No Testing   Follow-Up: At [facility], you and your health needs are our priority.  As part of our continuing mission to provide you with exceptional heart care, we have created designated Provider Care Teams.  These Care Teams include your primary Cardiologist (physician) and Advanced Practice Providers (APPs -  Physician Assistants and Nurse Practitioners) who all work together to provide you with the care you need, when you need it.  We recommend signing up for the patient portal called "MyChart".  Sign up information is provided on this After Visit Summary.  MyChart is used to connect with patients for Virtual Visits (Telemedicine).  Patients are able to view lab/test results, encounter  notes, upcoming appointments, etc.  Non-urgent messages can be sent to your provider as well.   To learn more about what you can do with MyChart, go to [email or web address].    Your next appointment:   May 23, 2021 8:20 AM  The format for your next appointment:   In Person  Provider:   Kirk Ruths, MD   Other Instructions  Call the office is weight gain is more that 3lbs overnight and 5 lbs in a week. Please call our office    Signed, Almyra Deforest, [place]  04/11/2021 10:53 PM    [place]

## 2021-04-09 NOTE — Patient Instructions (Signed)
Medication Instructions:  Take an Additional Lasix 40 mg (As needed for Swelling) *If you need a refill on your cardiac medications before your next appointment, please call your pharmacy*   Lab Work: No Labs If you have labs (blood work) drawn today and your tests are completely normal, you will receive your results only by: Marland Kitchen MyChart Message (if you have MyChart) OR . A paper copy in the mail If you have any lab test that is abnormal or we need to change your treatment, we will call you to review the results.   Testing/Procedures: No Testing   Follow-Up: At [facility], you and your health needs are our priority.  As part of our continuing mission to provide you with exceptional heart care, we have created designated Provider Care Teams.  These Care Teams include your primary Cardiologist (physician) and Advanced Practice Providers (APPs -  Physician Assistants and Nurse Practitioners) who all work together to provide you with the care you need, when you need it.  We recommend signing up for the patient portal called "MyChart".  Sign up information is provided on this After Visit Summary.  MyChart is used to connect with patients for Virtual Visits (Telemedicine).  Patients are able to view lab/test results, encounter notes, upcoming appointments, etc.  Non-urgent messages can be sent to your provider as well.   To learn more about what you can do with MyChart, go to [email or web address].    Your next appointment:   May 23, 2021 8:20 AM  The format for your next appointment:   In Person  Provider:   Kirk Ruths, MD   Other Instructions  Call the office is weight gain is more that 3lbs overnight and 5 lbs in a week. Please call our office

## 2021-04-11 ENCOUNTER — Encounter: Payer: Self-pay | Admitting: Physician Assistant

## 2021-04-22 ENCOUNTER — Other Ambulatory Visit: Payer: Self-pay

## 2021-04-22 ENCOUNTER — Encounter (HOSPITAL_COMMUNITY): Payer: Self-pay

## 2021-04-22 DIAGNOSIS — R519 Headache, unspecified: Secondary | ICD-10-CM | POA: Insufficient documentation

## 2021-04-22 DIAGNOSIS — M79672 Pain in left foot: Secondary | ICD-10-CM | POA: Insufficient documentation

## 2021-04-22 DIAGNOSIS — W01198A Fall on same level from slipping, tripping and stumbling with subsequent striking against other object, initial encounter: Secondary | ICD-10-CM | POA: Diagnosis not present

## 2021-04-22 DIAGNOSIS — Z79899 Other long term (current) drug therapy: Secondary | ICD-10-CM | POA: Insufficient documentation

## 2021-04-22 DIAGNOSIS — M25512 Pain in left shoulder: Secondary | ICD-10-CM | POA: Insufficient documentation

## 2021-04-22 DIAGNOSIS — Y92009 Unspecified place in unspecified non-institutional (private) residence as the place of occurrence of the external cause: Secondary | ICD-10-CM | POA: Diagnosis not present

## 2021-04-22 DIAGNOSIS — J45909 Unspecified asthma, uncomplicated: Secondary | ICD-10-CM | POA: Diagnosis not present

## 2021-04-22 DIAGNOSIS — I5032 Chronic diastolic (congestive) heart failure: Secondary | ICD-10-CM | POA: Diagnosis not present

## 2021-04-22 DIAGNOSIS — I2510 Atherosclerotic heart disease of native coronary artery without angina pectoris: Secondary | ICD-10-CM | POA: Diagnosis not present

## 2021-04-22 DIAGNOSIS — Z96652 Presence of left artificial knee joint: Secondary | ICD-10-CM | POA: Diagnosis not present

## 2021-04-22 DIAGNOSIS — J441 Chronic obstructive pulmonary disease with (acute) exacerbation: Secondary | ICD-10-CM | POA: Insufficient documentation

## 2021-04-22 DIAGNOSIS — Z87891 Personal history of nicotine dependence: Secondary | ICD-10-CM | POA: Insufficient documentation

## 2021-04-22 DIAGNOSIS — M25562 Pain in left knee: Secondary | ICD-10-CM | POA: Diagnosis not present

## 2021-04-22 DIAGNOSIS — I110 Hypertensive heart disease with heart failure: Secondary | ICD-10-CM | POA: Insufficient documentation

## 2021-04-22 DIAGNOSIS — Z7982 Long term (current) use of aspirin: Secondary | ICD-10-CM | POA: Diagnosis not present

## 2021-04-22 DIAGNOSIS — Z96651 Presence of right artificial knee joint: Secondary | ICD-10-CM | POA: Diagnosis not present

## 2021-04-22 DIAGNOSIS — Z7951 Long term (current) use of inhaled steroids: Secondary | ICD-10-CM | POA: Insufficient documentation

## 2021-04-23 ENCOUNTER — Emergency Department (HOSPITAL_COMMUNITY)
Admission: EM | Admit: 2021-04-23 | Discharge: 2021-04-23 | Disposition: A | Discharge status: Home / Self Care | Payer: BC Managed Care – PPO | Attending: Emergency Medicine | Admitting: Emergency Medicine

## 2021-04-23 ENCOUNTER — Emergency Department (HOSPITAL_COMMUNITY): Payer: BC Managed Care – PPO

## 2021-04-23 DIAGNOSIS — S0990XA Unspecified injury of head, initial encounter: Secondary | ICD-10-CM

## 2021-04-23 DIAGNOSIS — S8002XA Contusion of left knee, initial encounter: Secondary | ICD-10-CM

## 2021-04-23 DIAGNOSIS — W19XXXA Unspecified fall, initial encounter: Secondary | ICD-10-CM

## 2021-04-23 DIAGNOSIS — S022XXA Fracture of nasal bones, initial encounter for closed fracture: Secondary | ICD-10-CM

## 2021-04-23 DIAGNOSIS — S93602A Unspecified sprain of left foot, initial encounter: Secondary | ICD-10-CM

## 2021-04-23 DIAGNOSIS — S161XXA Strain of muscle, fascia and tendon at neck level, initial encounter: Secondary | ICD-10-CM

## 2021-04-23 MED ORDER — TRAMADOL HCL 50 MG PO TABS: 50.0000 mg | ORAL_TABLET | Freq: Four times a day (QID) | ORAL | 0 refills | Status: DC | PRN

## 2021-04-23 MED ORDER — HYDROCODONE-ACETAMINOPHEN 5-325 MG PO TABS
2.0000 | ORAL_TABLET | Freq: Once | ORAL | Status: AC
Start: 2021-04-23 — End: 2021-04-23
  Administered 2021-04-23: 2 via ORAL
  Filled 2021-04-23: qty 2

## 2021-04-23 NOTE — Discharge Instructions (Addendum)
Take tramadol as prescribed as needed for pain.  Follow-up with primary doctor if symptoms are not improving in the next few days.

## 2021-04-23 NOTE — ED Provider Notes (Signed)
[facility] DEPT Provider Note   CSN: [id number] Arrival date & time: 04/22/21  2333     History Chief Complaint  Patient presents with  . Fall    [name] is a 80 y.o. female.  Patient is a 80 year old female with past medical history of coronary artery disease, COPD, asthma, degenerative joint disease with left total knee replacement, hypertension.  Patient presents today for evaluation of fall.  Patient was at home ambulating when she states her left knee gave out on her.  This caused her to fall to the ground.  She struck her head on the floor.  She describes headache, but no loss of consciousness.  She has pain in the left shoulder, left knee, at the left foot.  She also had some bleeding from the nose and nose pain.  The history is provided by the patient.       Past Medical History:  Diagnosis Date  . Allergic rhinitis   . Arnold-Chiari malformation ([place])   . Asthma   . CAD (coronary artery disease)   . COPD (chronic obstructive pulmonary disease) ([place])   . Coughing    coughing since last 01-22-2019 , started on cefdinir bid x10 days, has 3 pills left today , reports she feels mcuh better , still coughing copiuus amonts of thick white sputum , denies fever nor chills, nor body aches .   Marland Kitchen DJD (degenerative joint disease)   . GERD (gastroesophageal reflux disease)   . History of absence seizures    last confirmed seizure age 60    . Hx of colonic polyps   . Hypercholesteremia   . Hypertension   . Obesity   . Positive PPD   . Reflex sympathetic dystrophy   . Varicose veins   . Venous insufficiency     Patient Active Problem List   Diagnosis Date Noted  . AKI (acute kidney injury) ([place]) 03/13/2021  . Memory loss 03/13/2021  . Anxiety 03/13/2021  . Hypokalemia 03/05/2021  . Generalized weakness 03/04/2021  . Dizziness 03/04/2021  . Vitamin D deficiency 01/16/2021  . Cough 12/13/2020  . 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  . Allergic  rhinitis due to animal (cat) (dog) hair and dander 12/04/2020  . Allergic rhinitis due to pollen 12/04/2020  . Moderate persistent asthma, uncomplicated 37/08/6268  . Radiculopathy, lumbar region 11/19/2020  . Spondylolisthesis, lumbar region 11/19/2020  . Leg cramping 11/06/2020  . Degeneration of lumbar intervertebral disc 09/27/2020  . Degenerative spondylolisthesis 08/15/2020  . Pes anserinus bursitis of right knee 05/07/2020  . Ulcer of toe, left, limited to breakdown of skin ([place]) 08/31/2019  . Diastolic CHF, chronic ([place]) 08/31/2019  . PVD (peripheral vascular disease) ([place]) 08/31/2019  . Bilateral carpal tunnel syndrome 08/19/2019  . Posterior neck pain 08/19/2019  . Vertigo 04/08/2019  . Anemia 04/08/2019  . Stiffness of left knee 02/10/2019  . Failed total knee, left ([place]) 02/03/2019  . Failed total knee, left, initial encounter ([place]) 02/03/2019  . Carpal tunnel syndrome of left wrist 01/21/2019  . Pain of left hand 01/21/2019  . Preop exam for internal medicine 11/02/2018  . Left hand paresthesia 11/02/2018  . 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 Hyperopia with presbyopia, bilateral 06/29/2018  . Abdominal pain, lower 03/02/2018  . Diarrhea 03/02/2018  . Encounter for well adult exam with abnormal findings 11/14/2017  . Arnold-Chiari malformation ([place])   . Asthma   . CAD (coronary artery disease)   . COPD (chronic  obstructive pulmonary disease) ([place])   . DJD (degenerative joint disease)   . History of absence seizures   . Hx of colonic polyps   . Hypercholesteremia   . Positive PPD   . Venous insufficiency   . Pain in left lower leg 07/20/2017  . Cellulitis of left lower extremity 06/18/2017  . Bilateral lower extremity edema 04/30/2017  . Shingles 02/26/2017  . COPD exacerbation ([place]) 01/07/2017  . S/P TKR (total knee replacement), bilateral 01/07/2017  . Right arm pain 05/16/2015  . Generalized anxiety disorder 08/31/2014  .  Varicose veins of lower extremities with other complications 17/79/3903  . Edema 08/07/2011  . MENORRHAGIA, POSTMENOPAUSAL 08/13/2010  . CIGARETTE SMOKER 06/11/2010  . CARDIAC MURMUR 11/02/2009  . BRADYCARDIA 11/01/2009  . CHEST PAIN 11/01/2009  . Allergic rhinitis 04/19/2009  . Chronic obstructive airway disease with asthma ([place]) 04/18/2009  . LEG CRAMPS, NOCTURNAL 11/02/2008  . Essential hypertension 01/17/2008  . COLONIC POLYPS 01/14/2008  . Body mass index (BMI) 36.0-36.9, adult 01/14/2008  . Reflex sympathetic dystrophy 01/14/2008  . Coronary atherosclerosis 01/14/2008  . GERD 01/14/2008  . Osteoarthritis 01/14/2008  . BACK PAIN, LUMBAR 01/14/2008  . SEIZURES, HX OF 01/14/2008    Past Surgical History:  Procedure Laterality Date  . ABDOMINAL HYSTERECTOMY    . cspine surgery  1993   for arnold-chiari malformation  . JOINT REPLACEMENT  06/05/11   Left total knee replacement  . right knee arthroscopy  12/2007   Dr. Tonita Cong  . right total knee replacement  05/2008   Dr. Tonita Cong  . TOTAL KNEE REVISION Left 02/03/2019   Procedure: LEFT TOTAL KNEE REVISION;  Surgeon: Rod Can, MD;  Location: [facility];  Service: Orthopedics;  Laterality: Left;     OB History   No obstetric history on file.     Family History  Problem Relation Age of Onset  . Heart disease Mother   . Stroke Mother   . Stroke Father   . Clotting disorder Father   . Colon cancer Neg Hx   . Stomach cancer Neg Hx     Social History   Tobacco Use  . Smoking status: Former Smoker    Packs/day: 0.30    Years: 12.00    Pack years: 3.60    Types: Cigarettes    Quit date: 01/02/2012    Years since quitting: 9.3  . Smokeless tobacco: Never Used  . Tobacco comment: 1 pack per week  Vaping Use  . Vaping Use: Never used  Substance Use Topics  . Alcohol use: No  . Drug use: No    Home Medications Prior to Admission medications   Medication Sig Start Date End Date Taking? Authorizing Provider  albuterol  (PROVENTIL HFA;VENTOLIN HFA) 108 (90 BASE) MCG/ACT inhaler Inhale 2 puffs into the lungs every 4 (four) hours as needed. For wheeze or shortness of breath 03/26/12   Noralee Space, MD  alendronate (FOSAMAX) 70 MG tablet TAKE 1 TABLET (70 MG TOTAL) BY MOUTH EVERY 7 (SEVEN) DAYS. TAKE WITH A FULL GLASS OF WATER ON AN EMPTY STOMACH. 12/07/19   Biagio Borg, MD  aspirin 81 MG tablet Take 81 mg by mouth daily.    [provider]  azelastine (ASTELIN) 0.1 % nasal spray Place 1 spray into both nostrils daily as needed for rhinitis or allergies.  11/07/18   [provider]  budesonide-formoterol (SYMBICORT) 160-4.5 MCG/ACT inhaler     [provider]  citalopram (CELEXA) 10 MG tablet Take 1  tablet (10 mg total) by mouth daily. Patient not taking: Reported on 04/09/2021 03/13/21 03/13/22  Biagio Borg, MD  [employer] D3 50 MCG (2000 UT) CAPS Take 1 capsule by mouth daily. 01/09/21   [provider]  donepezil (ARICEPT ODT) 5 MG disintegrating tablet Take 1 tablet (5 mg total) by mouth at bedtime. Patient not taking: Reported on 04/09/2021 03/13/21   Biagio Borg, MD  EPINEPHrine 0.3 mg/0.3 mL IJ SOAJ injection Inject 0.3 mg into the muscle as needed for anaphylaxis.    [provider]  famotidine (PEPCID) 20 MG tablet Take 20 mg by mouth 2 (two) times daily. 01/07/21   [provider]  ferrous sulfate 325 (65 FE) MG EC tablet Take 325 mg by mouth daily.    [provider]  fluticasone (FLONASE) 50 MCG/ACT nasal spray Place 1 spray into both nostrils 2 (two) times daily as needed for allergies or rhinitis.    [provider]  furosemide (LASIX) 40 MG tablet Take 1 tablet (40 mg total) by mouth daily. 03/08/21 04/07/21  Donne Hazel, MD  meloxicam (MOBIC) 15 MG tablet TAKE 1 TABLET BY MOUTH EVERY DAY AS NEEDED FOR PAIN 09/26/20   Biagio Borg, MD  montelukast (SINGULAIR) 10 MG tablet Take 10 mg by mouth every evening.    [provider]   oxyCODONE-acetaminophen (PERCOCET) 10-325 MG tablet Take 1 tablet by mouth 3 (three) times daily as needed for pain. 02/05/21   [provider]  pantoprazole (PROTONIX) 40 MG tablet Take 1 tablet (40 mg total) by mouth daily. 01/09/21   Biagio Borg, MD  potassium chloride SA (KLOR-CON M20) 20 MEQ tablet Take 1 tablet (20 mEq total) by mouth 2 (two) times daily. 04/01/21   Biagio Borg, MD  rosuvastatin (CRESTOR) 40 MG tablet Take 1 tablet (40 mg total) by mouth daily. 12/24/20 03/24/21  Lelon Perla, MD  Sodium Chloride-Sodium Bicarb (SINUS [place] SQUEEZE BOTTLE) 2300-700 MG KIT See admin instructions.    [provider]  Tiotropium Bromide Monohydrate (SPIRIVA RESPIMAT) 1.25 MCG/ACT AERS Inhale 1.25 mcg into the lungs daily.    [provider]  tiZANidine (ZANAFLEX) 2 MG tablet TAKE 1 TABLET BY MOUTH EVERY 6 HOURS AS NEEDED FOR MUSCLE SPASMS. 12/27/20   Biagio Borg, MD    Allergies    Patient has no known allergies.  Review of Systems   Review of Systems  All other systems reviewed and are negative.   Physical Exam Updated Vital Signs BP 138/86 (BP Location: Right Arm)   Pulse (!) 50   Temp 98.3 F (36.8 C) (Oral)   Ht _0  (1.6 m)   Wt 93.9 kg   SpO2 100%   BMI 36.67 kg/m   Physical Exam Vitals and nursing note reviewed.  Constitutional:      General: She is not in acute distress.    Appearance: She is well-developed. She is not diaphoretic.  HENT:     Head: Normocephalic and atraumatic.     Nose: Nose normal.     Comments: Nose appears midline and normal.  There is slight dried blood in the nares.  There is no septal hematoma. Eyes:     Extraocular Movements: Extraocular movements intact.     Pupils: Pupils are equal, round, and reactive to light.  Cardiovascular:     Rate and Rhythm: Normal rate and regular rhythm.     Heart sounds: No murmur heard. No friction rub. No gallop.  Pulmonary:     Effort: Pulmonary effort is normal. No  respiratory distress.     Breath sounds: Normal breath sounds. No wheezing.  Abdominal:     General: Bowel sounds are normal. There is no distension.     Palpations: Abdomen is soft.     Tenderness: There is no abdominal tenderness.  Musculoskeletal:        General: Normal range of motion.     Cervical back: Normal range of motion and neck supple.     Comments: Left shoulder appears grossly normal.  She has good range of motion, but with some discomfort.  Ulnar and radial pulses are easily palpable and motor and sensation are intact throughout the entire hand.  The left knee is grossly normal without deformity.  There is no palpable effusion.  There is some varus instability, but negative anterior and posterior drawer.  Distal PMS is intact.  Skin:    General: Skin is warm and dry.  Neurological:     General: No focal deficit present.     Mental Status: She is alert and oriented to person, place, and time.     Cranial Nerves: No cranial nerve deficit.     Motor: No weakness.     ED Results / Procedures / Treatments   Labs (all labs ordered are listed, but only abnormal results are displayed) Labs Reviewed - No data to display  EKG None  Radiology CT Head Wo Contrast  Result Date: 04/23/2021 CLINICAL DATA:  Facial trauma.  Status post fall EXAM: CT HEAD WITHOUT CONTRAST CT MAXILLOFACIAL WITHOUT CONTRAST CT CERVICAL SPINE WITHOUT CONTRAST TECHNIQUE: Multidetector CT imaging of the head, cervical spine, and maxillofacial structures were performed using the standard protocol without intravenous contrast. Multiplanar CT image reconstructions of the cervical spine and maxillofacial structures were also generated. COMPARISON:  None. FINDINGS: CT HEAD FINDINGS Brain: Patchy and confluent areas of decreased attenuation are noted throughout the deep and periventricular white matter of the cerebral hemispheres bilaterally, compatible with chronic microvascular ischemic disease. No evidence of  large-territorial acute infarction. No parenchymal hemorrhage. No mass lesion. No extra-axial collection. No mass effect or midline shift. No hydrocephalus. Basilar cisterns are patent. Vascular: No hyperdense vessel. Skull: No acute fracture or focal lesion. Other: None. CT MAXILLOFACIAL FINDINGS Osseous: Age-indeterminate nondisplaced nasal bone fracture on the left. No fracture or mandibular dislocation. No destructive process. Patient is edentulous. Sinuses/Orbits: Paranasal sinuses and mastoid air cells are clear. The orbits are unremarkable. Soft tissues: Negative. CT CERVICAL SPINE FINDINGS Alignment: Grade 1 anterolisthesis of C2 on C3 likely due to associated degenerative changes. Skull base and vertebrae: Severe multilevel degenerative changes of the spine including intervertebral disc space narrowing, facet arthropathy, uncovertebral arthropathy, osteophyte formation. No acute fracture. No aggressive appearing focal osseous lesion or focal pathologic process. Soft tissues and spinal canal: No prevertebral fluid or swelling. No visible canal hematoma. Upper chest: Unremarkable. Other: None. IMPRESSION: 1. No acute intracranial abnormality. 2. Age-indeterminate nondisplaced left nasal bone fracture. 3. No acute displaced fracture or traumatic listhesis of the cervical spine. Electronically Signed   By: Iven Finn M.D.   On: 04/23/2021 01:19   CT Cervical Spine Wo Contrast  Result Date: 04/23/2021 CLINICAL DATA:  Facial trauma.  Status post fall EXAM: CT HEAD WITHOUT CONTRAST CT MAXILLOFACIAL WITHOUT CONTRAST CT CERVICAL SPINE WITHOUT CONTRAST TECHNIQUE: Multidetector CT imaging of the head, cervical spine, and maxillofacial structures were performed using the standard protocol without intravenous contrast. Multiplanar CT image reconstructions of the  cervical spine and maxillofacial structures were also generated. COMPARISON:  None. FINDINGS: CT HEAD FINDINGS Brain: Patchy and confluent areas of  decreased attenuation are noted throughout the deep and periventricular white matter of the cerebral hemispheres bilaterally, compatible with chronic microvascular ischemic disease. No evidence of large-territorial acute infarction. No parenchymal hemorrhage. No mass lesion. No extra-axial collection. No mass effect or midline shift. No hydrocephalus. Basilar cisterns are patent. Vascular: No hyperdense vessel. Skull: No acute fracture or focal lesion. Other: None. CT MAXILLOFACIAL FINDINGS Osseous: Age-indeterminate nondisplaced nasal bone fracture on the left. No fracture or mandibular dislocation. No destructive process. Patient is edentulous. Sinuses/Orbits: Paranasal sinuses and mastoid air cells are clear. The orbits are unremarkable. Soft tissues: Negative. CT CERVICAL SPINE FINDINGS Alignment: Grade 1 anterolisthesis of C2 on C3 likely due to associated degenerative changes. Skull base and vertebrae: Severe multilevel degenerative changes of the spine including intervertebral disc space narrowing, facet arthropathy, uncovertebral arthropathy, osteophyte formation. No acute fracture. No aggressive appearing focal osseous lesion or focal pathologic process. Soft tissues and spinal canal: No prevertebral fluid or swelling. No visible canal hematoma. Upper chest: Unremarkable. Other: None. IMPRESSION: 1. No acute intracranial abnormality. 2. Age-indeterminate nondisplaced left nasal bone fracture. 3. No acute displaced fracture or traumatic listhesis of the cervical spine. Electronically Signed   By: Iven Finn M.D.   On: 04/23/2021 01:19   DG Shoulder Left  Result Date: 04/23/2021 CLINICAL DATA:  Status post fall. EXAM: LEFT SHOULDER - 2+ VIEW COMPARISON:  Chest x-ray 01/08/2017, chest x-ray 03/14/2021 FINDINGS: There is no evidence of fracture or dislocation. Degenerative changes of the left humeral head and acromioclavicular joint. Soft tissues are unremarkable. IMPRESSION: 1.  No acute displaced  fracture or dislocation. 2. Degenerative changes of the left shoulder. Electronically Signed   By: Iven Finn M.D.   On: 04/23/2021 01:04   CT Maxillofacial Wo Contrast  Result Date: 04/23/2021 CLINICAL DATA:  Facial trauma.  Status post fall EXAM: CT HEAD WITHOUT CONTRAST CT MAXILLOFACIAL WITHOUT CONTRAST CT CERVICAL SPINE WITHOUT CONTRAST TECHNIQUE: Multidetector CT imaging of the head, cervical spine, and maxillofacial structures were performed using the standard protocol without intravenous contrast. Multiplanar CT image reconstructions of the cervical spine and maxillofacial structures were also generated. COMPARISON:  None. FINDINGS: CT HEAD FINDINGS Brain: Patchy and confluent areas of decreased attenuation are noted throughout the deep and periventricular white matter of the cerebral hemispheres bilaterally, compatible with chronic microvascular ischemic disease. No evidence of large-territorial acute infarction. No parenchymal hemorrhage. No mass lesion. No extra-axial collection. No mass effect or midline shift. No hydrocephalus. Basilar cisterns are patent. Vascular: No hyperdense vessel. Skull: No acute fracture or focal lesion. Other: None. CT MAXILLOFACIAL FINDINGS Osseous: Age-indeterminate nondisplaced nasal bone fracture on the left. No fracture or mandibular dislocation. No destructive process. Patient is edentulous. Sinuses/Orbits: Paranasal sinuses and mastoid air cells are clear. The orbits are unremarkable. Soft tissues: Negative. CT CERVICAL SPINE FINDINGS Alignment: Grade 1 anterolisthesis of C2 on C3 likely due to associated degenerative changes. Skull base and vertebrae: Severe multilevel degenerative changes of the spine including intervertebral disc space narrowing, facet arthropathy, uncovertebral arthropathy, osteophyte formation. No acute fracture. No aggressive appearing focal osseous lesion or focal pathologic process. Soft tissues and spinal canal: No prevertebral fluid or  swelling. No visible canal hematoma. Upper chest: Unremarkable. Other: None. IMPRESSION: 1. No acute intracranial abnormality. 2. Age-indeterminate nondisplaced left nasal bone fracture. 3. No acute displaced fracture or traumatic listhesis of the cervical spine. Electronically Signed  By: Iven Finn M.D.   On: 04/23/2021 01:19    Procedures Procedures   Medications Ordered in ED Medications  HYDROcodone-acetaminophen (NORCO/VICODIN) 5-325 MG per tablet 2 tablet (has no administration in time range)    ED Course  I have reviewed the triage vital signs and the nursing notes.  Pertinent labs & imaging results that were available during my care of the patient were reviewed by me and considered in my medical decision making (see chart for details).    MDM Rules/Calculators/A&P  Patient is a 80 year old female presenting after a fall at home.  Patient states her left knee gave out on her causing her to fall forward.  She struck her face on the floor.  There is no loss of consciousness, she is neurologically intact, and head CT is negative.  Cervical spine CT is also negative.  X-rays of the knee and foot were also obtained showing no fracture or dislocation.  Patient tells me her knee gives out on her frequently and that this is not a rare occurrence.  Patient does have help at home and discharge seems appropriate at this time.  She will be given medicine for pain and is to return as needed.  Final Clinical Impression(s) / ED Diagnoses Final diagnoses:  None    Rx / DC Orders ED Discharge Orders    None       Veryl Speak, MD 04/23/21 [phone]

## 2021-04-23 NOTE — ED Triage Notes (Signed)
Pt presents from home via [facility], states she had a fall at home where she felt like her legs gave out. States this happens frequently. C/o L shoulder, leg and head pain. Pt denies any blood thinners or blurry vision. Does report a nose bleed after the fall that has since resolved

## 2021-04-23 NOTE — ED Notes (Signed)
Patient states left foot gave out and she fell in her bathroom while getting ready for bed. Patient is complaining of left side of body pain.

## 2021-04-24 ENCOUNTER — Other Ambulatory Visit: Payer: Self-pay | Admitting: Internal Medicine

## 2021-05-03 ENCOUNTER — Other Ambulatory Visit: Payer: Self-pay

## 2021-05-03 ENCOUNTER — Ambulatory Visit (INDEPENDENT_AMBULATORY_CARE_PROVIDER_SITE_OTHER): Payer: BC Managed Care – PPO

## 2021-05-03 ENCOUNTER — Encounter: Payer: Self-pay | Admitting: Podiatrist

## 2021-05-03 ENCOUNTER — Ambulatory Visit (INDEPENDENT_AMBULATORY_CARE_PROVIDER_SITE_OTHER): Payer: BC Managed Care – PPO | Admitting: Podiatrist

## 2021-05-03 DIAGNOSIS — M7130 Other bursal cyst, unspecified site: Secondary | ICD-10-CM

## 2021-05-03 DIAGNOSIS — S99922A Unspecified injury of left foot, initial encounter: Secondary | ICD-10-CM | POA: Diagnosis not present

## 2021-05-03 DIAGNOSIS — I2510 Atherosclerotic heart disease of native coronary artery without angina pectoris: Secondary | ICD-10-CM

## 2021-05-03 DIAGNOSIS — M71372 Other bursal cyst, left ankle and foot: Secondary | ICD-10-CM | POA: Diagnosis not present

## 2021-05-03 MED ORDER — TRIAMCINOLONE ACETONIDE 10 MG/ML IJ SUSP
10.0000 mg | Freq: Once | INTRAMUSCULAR | Status: AC
  Administered 2021-05-03: 10 mg

## 2021-05-03 NOTE — Patient Instructions (Signed)
You may apply ice to your heel if you like-  It may or may not be helpful with the pain.

## 2021-05-03 NOTE — Progress Notes (Signed)
Chief Complaint  Patient presents with  . heel problem    Pt states her left heel is sore and painful since Monday night. Pt states she has placed her foot In hot water and soaked her foot/      HPI: Patient is 80 y.o. female who presents today for the concerns as listed above. Lonni relates she fell recently and had ankle pain that has since resolved but now she has noticed a pain in the central aspect of the left heel that feels like a blister.  She has tried no treatments.    Current Outpatient Medications on File Prior to Visit  Medication Sig Dispense Refill  . albuterol (PROVENTIL HFA;VENTOLIN HFA) 108 (90 BASE) MCG/ACT inhaler Inhale 2 puffs into the lungs every 4 (four) hours as needed. For wheeze or shortness of breath 1 Inhaler 6  . alendronate (FOSAMAX) 70 MG tablet TAKE 1 TABLET (70 MG TOTAL) BY MOUTH EVERY 7 (SEVEN) DAYS. TAKE WITH A FULL GLASS OF WATER ON AN EMPTY STOMACH. 12 tablet 1  . aspirin 81 MG tablet Take 81 mg by mouth daily.    Marland Kitchen azelastine (ASTELIN) 0.1 % nasal spray Place 1 spray into both nostrils daily as needed for rhinitis or allergies.     . budesonide-formoterol (SYMBICORT) 160-4.5 MCG/ACT inhaler     . citalopram (CELEXA) 10 MG tablet Take 1 tablet (10 mg total) by mouth daily. (Patient not taking: Reported on 04/09/2021) 90 tablet 3  . [facility] D3 50 MCG (2000 UT) CAPS Take 1 capsule by mouth daily.    Marland Kitchen donepezil (ARICEPT ODT) 5 MG disintegrating tablet Take 1 tablet (5 mg total) by mouth at bedtime. (Patient not taking: Reported on 04/09/2021) 90 tablet 3  . EPINEPHrine 0.3 mg/0.3 mL IJ SOAJ injection Inject 0.3 mg into the muscle as needed for anaphylaxis.    . famotidine (PEPCID) 20 MG tablet Take 20 mg by mouth 2 (two) times daily.    . ferrous sulfate 325 (65 FE) MG EC tablet Take 325 mg by mouth daily.    . fluticasone (FLONASE) 50 MCG/ACT nasal spray Place 1 spray into both nostrils 2 (two) times daily as needed for allergies or rhinitis.    . furosemide  (LASIX) 40 MG tablet Take 1 tablet (40 mg total) by mouth daily. 30 tablet 0  . meloxicam (MOBIC) 15 MG tablet TAKE 1 TABLET BY MOUTH EVERY DAY AS NEEDED FOR PAIN 30 tablet 5  . montelukast (SINGULAIR) 10 MG tablet Take 10 mg by mouth every evening.    Marland Kitchen oxyCODONE-acetaminophen (PERCOCET) 10-325 MG tablet Take 1 tablet by mouth 3 (three) times daily as needed for pain.    . pantoprazole (PROTONIX) 40 MG tablet Take 1 tablet (40 mg total) by mouth daily. 90 tablet 3  . potassium chloride SA (KLOR-CON M20) 20 MEQ tablet Take 1 tablet (20 mEq total) by mouth 2 (two) times daily. 60 tablet 5  . rosuvastatin (CRESTOR) 40 MG tablet Take 1 tablet (40 mg total) by mouth daily. 90 tablet 3  . Sodium Chloride-Sodium Bicarb (SINUS [place] SQUEEZE BOTTLE) 2300-700 MG KIT See admin instructions.    . Tiotropium Bromide Monohydrate (SPIRIVA RESPIMAT) 1.25 MCG/ACT AERS Inhale 1.25 mcg into the lungs daily.    Marland Kitchen tiZANidine (ZANAFLEX) 2 MG tablet TAKE 1 TABLET BY MOUTH EVERY 6 HOURS AS NEEDED FOR MUSCLE SPASMS. 40 tablet 1  . traMADol (ULTRAM) 50 MG tablet Take 1 tablet (50 mg total) by mouth every 6 (six) hours  as needed. 15 tablet 0  . TRAMADOL HCL PO tramadol     No current facility-administered medications on file prior to visit.    No Known Allergies  Review of Systems No fevers, chills, nausea, muscle aches, no difficulty breathing, no calf pain, no chest pain or shortness of breath.   Physical Exam  GENERAL APPEARANCE: Alert, conversant. Appropriately groomed. No acute distress.   VASCULAR: Pedal pulses palpable DP and PT bilateral.  Capillary refill time is immediate to all digits,  Proximal to distal cooling it warm to warm.  Digital perfusion adequate.   NEUROLOGIC: sensation is intact and unchanged from her last visit.  MUSCULOSKELETAL: acceptable muscle strength, tone and stability bilateral.  Pes planus noted bilateral.  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  Bunion  and lateral deviation of the left hallux noted.   Pain is present with direct pressure on the central aspect of the plantar heel.  A cyst/ bursa is palpated just superficial to the plantar fascia at its insertion- it is painful with pressure.  DERMATOLOGIC: skin is warm, supple, and dry.  No open lesions noted.  No rash, no pre ulcerative lesions. Digital nails are asymptomatic.    Xrays:  3 views of the left foot are obtained.  No obvious spurring on the plantar calcaneus is noted.  On the oblique view there is arthritic spurring present laterally. Bunion present.  Amputation of the second digit noted.  Hammertoe third digit noted.  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  Old healed fracture of the fourth metatarsal is also noted.   Assessment     ICD-10-CM   1. Foot injury, left, initial encounter  E99.371I DG Foot Complete Left  2. Other bursal cyst, left ankle and foot  M71.372      Plan  Exam findings and xrays discussed with the patient.  I recommended trying a steriod injection into the bursa/cyst to see if this would help with her pain in her heel as she had obvious pain with pressure in this area.  She agreed and the skin was prepped with alcohol.  An injection consisting of $RemoveBefor'10mg'NyHnWEfqlWOS$  kenalog and lidocaine plain was infiltrated into the area of tenderness. She tolerated this well.  She will ice the area and try to stay off of it for the next few days.  She will be seen back in 1 month for recheck of the plantar left heel cyst/bursa as well as for her routine care appointment.  She normally sees Dr. Elisha Ponder for routine care.  If any concerns arise in the meantime she will call.

## 2021-05-06 ENCOUNTER — Other Ambulatory Visit: Payer: Self-pay

## 2021-05-07 ENCOUNTER — Encounter: Payer: Self-pay | Admitting: Internal Medicine

## 2021-05-07 ENCOUNTER — Ambulatory Visit: Payer: BC Managed Care – PPO | Admitting: Internal Medicine

## 2021-05-07 VITALS — BP 114/74 | HR 50 | Temp 98.3°F | Ht 63.0 in | Wt 214.0 lb

## 2021-05-07 DIAGNOSIS — E7800 Pure hypercholesterolemia, unspecified: Secondary | ICD-10-CM | POA: Diagnosis not present

## 2021-05-07 DIAGNOSIS — E538 Deficiency of other specified B group vitamins: Secondary | ICD-10-CM | POA: Diagnosis not present

## 2021-05-07 DIAGNOSIS — I10 Essential (primary) hypertension: Secondary | ICD-10-CM

## 2021-05-07 DIAGNOSIS — Z0001 Encounter for general adult medical examination with abnormal findings: Secondary | ICD-10-CM

## 2021-05-07 DIAGNOSIS — F419 Anxiety disorder, unspecified: Secondary | ICD-10-CM

## 2021-05-07 DIAGNOSIS — M21372 Foot drop, left foot: Secondary | ICD-10-CM

## 2021-05-07 DIAGNOSIS — J309 Allergic rhinitis, unspecified: Secondary | ICD-10-CM | POA: Diagnosis not present

## 2021-05-07 DIAGNOSIS — E559 Vitamin D deficiency, unspecified: Secondary | ICD-10-CM | POA: Diagnosis not present

## 2021-05-07 LAB — URINALYSIS, ROUTINE W REFLEX MICROSCOPIC
Bilirubin Urine: NEGATIVE
Hgb urine dipstick: NEGATIVE
Leukocytes,Ua: NEGATIVE
Nitrite: NEGATIVE
RBC / HPF: NONE SEEN (ref 0–?)
Specific Gravity, Urine: >=1.03 — AB (ref 1.000–1.030)
Total Protein, Urine: 30 — AB
Urine Glucose: NEGATIVE
Urobilinogen, UA: 0.2 (ref 0.0–1.0)
pH: 6 (ref 5.0–8.0)

## 2021-05-07 LAB — HEPATIC FUNCTION PANEL
ALT: 16 U/L (ref 0–35)
AST: 19 U/L (ref 0–37)
Albumin: 3.8 g/dL (ref 3.5–5.2)
Alkaline Phosphatase: 60 U/L (ref 39–117)
Bilirubin, Direct: 0 mg/dL (ref 0.0–0.3)
Total Bilirubin: 0.3 mg/dL (ref 0.2–1.2)
Total Protein: 6.9 g/dL (ref 6.0–8.3)

## 2021-05-07 LAB — BASIC METABOLIC PANEL
BUN: 18 mg/dL (ref 6–23)
CO2: 25 mEq/L (ref 19–32)
Calcium: 9.2 mg/dL (ref 8.4–10.5)
Chloride: 107 mEq/L (ref 96–112)
Creatinine, Ser: 0.9 mg/dL (ref 0.40–1.20)
GFR: 61.32 mL/min (ref >60.00)
Glucose, Bld: 90 mg/dL (ref 70–99)
Potassium: 4.8 mEq/L (ref 3.5–5.1)
Sodium: 141 mEq/L (ref 135–145)

## 2021-05-07 LAB — CBC WITH DIFFERENTIAL/PLATELET
Basophils Absolute: 0 10*3/uL (ref 0.0–0.1)
Basophils Relative: 0.6 % (ref 0.0–3.0)
Eosinophils Absolute: 0.1 10*3/uL (ref 0.0–0.7)
Eosinophils Relative: 1.9 % (ref 0.0–5.0)
HCT: 33.1 % — ABNORMAL LOW (ref 36.0–46.0)
Hemoglobin: 10.7 g/dL — ABNORMAL LOW (ref 12.0–15.0)
Lymphocytes Relative: 30.6 % (ref 12.0–46.0)
Lymphs Abs: 1 10*3/uL (ref 0.7–4.0)
MCHC: 32.2 g/dL (ref 30.0–36.0)
MCV: 82.7 fl (ref 78.0–100.0)
Monocytes Absolute: 0.3 10*3/uL (ref 0.1–1.0)
Monocytes Relative: 10.4 % (ref 3.0–12.0)
Neutro Abs: 1.9 10*3/uL (ref 1.4–7.7)
Neutrophils Relative %: 56.5 % (ref 43.0–77.0)
Platelets: 156 10*3/uL (ref 150.0–400.0)
RBC: 4 Mil/uL (ref 3.87–5.11)
RDW: 16 % — ABNORMAL HIGH (ref 11.5–15.5)
WBC: 3.4 10*3/uL — ABNORMAL LOW (ref 4.0–10.5)

## 2021-05-07 LAB — TSH: TSH: 1.63 u[IU]/mL (ref 0.35–4.50)

## 2021-05-07 LAB — LIPID PANEL
Cholesterol: 121 mg/dL (ref 0–200)
HDL: 72.3 mg/dL (ref >39.00)
LDL Cholesterol: 38 mg/dL (ref 0–99)
NonHDL: 48.45
Total CHOL/HDL Ratio: 2
Triglycerides: 52 mg/dL (ref 0.0–149.0)
VLDL: 10.4 mg/dL (ref 0.0–40.0)

## 2021-05-07 LAB — VITAMIN D 25 HYDROXY (VIT D DEFICIENCY, FRACTURES): VITD: 36.22 ng/mL (ref 30.00–100.00)

## 2021-05-07 LAB — VITAMIN B12: Vitamin B-12: 320 pg/mL (ref 211–911)

## 2021-05-07 MED ORDER — CITALOPRAM HYDROBROMIDE 10 MG PO TABS: 10.0000 mg | ORAL_TABLET | Freq: Every day | ORAL | 3 refills | Status: DC

## 2021-05-07 MED ORDER — DONEPEZIL HCL 5 MG PO TBDP: 5.0000 mg | ORAL_TABLET | Freq: Every day | ORAL | 3 refills | Status: DC

## 2021-05-07 MED ORDER — PANTOPRAZOLE SODIUM 40 MG PO TBEC: 40.0000 mg | DELAYED_RELEASE_TABLET | Freq: Every day | ORAL | 3 refills | Status: DC

## 2021-05-07 MED ORDER — ALENDRONATE SODIUM 70 MG PO TABS: 70.0000 mg | ORAL_TABLET | ORAL | 3 refills | Status: DC

## 2021-05-07 MED ORDER — FAMOTIDINE 20 MG PO TABS: 20.0000 mg | ORAL_TABLET | Freq: Two times a day (BID) | ORAL | 3 refills | Status: DC

## 2021-05-07 MED ORDER — POTASSIUM CHLORIDE CRYS ER 20 MEQ PO TBCR: 20.0000 meq | EXTENDED_RELEASE_TABLET | Freq: Two times a day (BID) | ORAL | 3 refills | Status: DC

## 2021-05-07 MED ORDER — ROSUVASTATIN CALCIUM 40 MG PO TABS: 40.0000 mg | ORAL_TABLET | Freq: Every day | ORAL | 3 refills | Status: AC

## 2021-05-07 MED ORDER — MONTELUKAST SODIUM 10 MG PO TABS: 10.0000 mg | ORAL_TABLET | Freq: Every evening | ORAL | 3 refills | Status: DC

## 2021-05-07 NOTE — Progress Notes (Signed)
Patient ID: [name], female   DOB: Sep 08, 1941, 80 y.o.   MRN: [id number]         Chief Complaint:: yearly exam and allergies, left foot drop, htn, hld       HPI:  [name] is a 80 y.o. female here for yearly exam; declines shingrix and pneumovax for now, o/w up to date with preventive referrals and immunizations                        Also s/p farily recent fall x 1 ( 2 wks ago) with broken bone now and left ankle sprain seen per ortho, for AFO fitting later today for left foot drop.  Also Does have several wks ongoing nasal allergy symptoms with clearish congestion, itch and sneezing, without fever, pain, ST, cough, swelling or wheezing, but has been out of singulair recently.  Pt denies chest pain, increased sob or doe, wheezing, orthopnea, PND, increased LE swelling, palpitations, dizziness or syncope.   Pt denies polydipsia, polyuria, or new focal neuro s/s.   Pt denies fever, wt loss, night sweats, loss of appetite, or other constitutional symptoms  No other new complaints   Wt Readings from Last 3 Encounters:  05/07/21 214 lb (97.1 kg)  04/22/21 207 lb (93.9 kg)  04/09/21 209 lb (94.8 kg)   BP Readings from Last 3 Encounters:  05/07/21 114/74  04/23/21 121/74  04/09/21 100/62   Immunization History  Administered Date(s) Administered  . Fluad Quad(high Dose 65+) 09/24/2019  . Influenza Split 09/01/2015  . Influenza Whole 09/12/2009, 09/20/2010, 08/07/2011  . Influenza, High Dose Seasonal PF 02/02/2014, 09/11/2015, 12/10/2016, 01/11/2017, 09/25/2017, 12/17/2017, 09/01/2018, 01/24/2019, 05/23/2020, 01/07/2021  . Influenza,inj,Quad PF,6+ Mos 08/31/2014  . Influenza-Unspecified 08/02/2020  . PFIZER Comirnaty(Gray Top)Covid-19 Tri-Sucrose Vaccine 01/08/2021  . PFIZER(Purple Top)SARS-COV-2 Vaccination 01/27/2020, 02/22/2020, 05/23/2020  . Pneumococcal Conjugate-13 11/02/2018  . Pneumococcal Polysaccharide-23 07/22/2018, 01/24/2019, 05/23/2020, 01/07/2021  .  Pneumococcal-Unspecified 12/01/2013  . Tdap 11/14/2017   There are no preventive care reminders to display for this patient.    Past Medical History:  Diagnosis Date  . Allergic rhinitis   . Arnold-Chiari malformation ([place])   . Asthma   . CAD (coronary artery disease)   . COPD (chronic obstructive pulmonary disease) ([place])   . Coughing    coughing since last 01-22-2019 , started on cefdinir bid x10 days, has 3 pills left today , reports she feels mcuh better , still coughing copiuus amonts of thick white sputum , denies fever nor chills, nor body aches .   Marland Kitchen DJD (degenerative joint disease)   . GERD (gastroesophageal reflux disease)   . History of absence seizures    last confirmed seizure age 47    . Hx of colonic polyps   . Hypercholesteremia   . Hypertension   . Obesity   . Positive PPD   . Reflex sympathetic dystrophy   . Varicose veins   . Venous insufficiency    Past Surgical History:  Procedure Laterality Date  . ABDOMINAL HYSTERECTOMY    . cspine surgery  1993   for arnold-chiari malformation  . JOINT REPLACEMENT  06/05/11   Left total knee replacement  . right knee arthroscopy  12/2007   Dr. Tonita Cong  . right total knee replacement  05/2008   Dr. Tonita Cong  . TOTAL KNEE REVISION Left 02/03/2019   Procedure: LEFT TOTAL KNEE REVISION;  Surgeon: Rod Can, MD;  Location: [facility];  Service: Orthopedics;  Laterality:  Left;    reports that she quit smoking about 9 years ago. Her smoking use included cigarettes. She has a 3.60 pack-year smoking history. She has never used smokeless tobacco. She reports that she does not drink alcohol and does not use drugs. family history includes Clotting disorder in her father; Heart disease in her mother; Stroke in her father and mother. No Known Allergies Current Outpatient Medications on File Prior to Visit  Medication Sig Dispense Refill  . albuterol (PROVENTIL HFA;VENTOLIN HFA) 108 (90 BASE) MCG/ACT inhaler Inhale 2 puffs into the lungs  every 4 (four) hours as needed. For wheeze or shortness of breath 1 Inhaler 6  . aspirin 81 MG tablet Take 81 mg by mouth daily.    Marland Kitchen azelastine (ASTELIN) 0.1 % nasal spray Place 1 spray into both nostrils daily as needed for rhinitis or allergies.     . budesonide-formoterol (SYMBICORT) 160-4.5 MCG/ACT inhaler     . CVS D3 50 MCG (2000 UT) CAPS Take 1 capsule by mouth daily.    Marland Kitchen EPINEPHrine 0.3 mg/0.3 mL IJ SOAJ injection Inject 0.3 mg into the muscle as needed for anaphylaxis.    . ferrous sulfate 325 (65 FE) MG EC tablet Take 325 mg by mouth daily.    . fluticasone (FLONASE) 50 MCG/ACT nasal spray Place 1 spray into both nostrils 2 (two) times daily as needed for allergies or rhinitis.    . meloxicam (MOBIC) 15 MG tablet TAKE 1 TABLET BY MOUTH EVERY DAY AS NEEDED FOR PAIN 30 tablet 5  . oxyCODONE-acetaminophen (PERCOCET) 10-325 MG tablet Take 1 tablet by mouth 3 (three) times daily as needed for pain.    . Sodium Chloride-Sodium Bicarb (SINUS [place] SQUEEZE BOTTLE) 2300-700 MG KIT See admin instructions.    . Tiotropium Bromide Monohydrate (SPIRIVA RESPIMAT) 1.25 MCG/ACT AERS Inhale 1.25 mcg into the lungs daily.    Marland Kitchen tiZANidine (ZANAFLEX) 2 MG tablet TAKE 1 TABLET BY MOUTH EVERY 6 HOURS AS NEEDED FOR MUSCLE SPASMS. 40 tablet 1  . traMADol (ULTRAM) 50 MG tablet Take 1 tablet (50 mg total) by mouth every 6 (six) hours as needed. 15 tablet 0  . TRAMADOL HCL PO tramadol    . furosemide (LASIX) 40 MG tablet Take 1 tablet (40 mg total) by mouth daily. 30 tablet 0   No current facility-administered medications on file prior to visit.        ROS:  All others reviewed and negative.  Objective        PE:  BP 114/74 (BP Location: Left Arm, Patient Position: Sitting, Cuff Size: Large)   Pulse (!) 50   Temp 98.3 F (36.8 C) (Oral)   Ht _0  (1.6 m)   Wt 214 lb (97.1 kg)   SpO2 97%   BMI 37.91 kg/m                 Constitutional: Pt appears in NAD               HENT: Head: NCAT.                 Right Ear: External ear normal.                 Left Ear: External ear normal. Bilat tm's with mild erythema.  Max sinus areas non tender.  Pharynx with mild erythema, no exudate               Eyes: . Pupils are equal, round, and reactive to  light. Conjunctivae and EOM are normal               Nose: without d/c or deformity               Neck: Neck supple. Gross normal ROM               Cardiovascular: Normal rate and regular rhythm.                 Pulmonary/Chest: Effort normal and breath sounds without rales or wheezing.                Abd:  Soft, NT, ND, + BS, no organomegaly               Neurological: Pt is alert. At baseline orientation, motor grossly intact               Skin: Skin is warm. No rashes, no other new lesions, LE edema - none               Psychiatric: Pt behavior is normal without agitation   Micro: none  Cardiac tracings I have personally interpreted today:  none  Pertinent Radiological findings (summarize): none   Lab Results  Component Value Date   WBC 3.4 (L) 05/07/2021   HGB 10.7 (L) 05/07/2021   HCT 33.1 (L) 05/07/2021   PLT 156.0 05/07/2021   GLUCOSE 90 05/07/2021   CHOL 121 05/07/2021   TRIG 52.0 05/07/2021   HDL 72.30 05/07/2021   LDLCALC 38 05/07/2021   ALT 16 05/07/2021   AST 19 05/07/2021   NA 141 05/07/2021   K 4.8 05/07/2021   CL 107 05/07/2021   CREATININE 0.90 05/07/2021   BUN 18 05/07/2021   CO2 25 05/07/2021   TSH 1.63 05/07/2021   INR 1.14 06/16/2011   HGBA1C 5.7 11/07/2020   Assessment/Plan:  [name] is a 80 y.o. Black or African American [2] female with  has a past medical history of Allergic rhinitis, Arnold-Chiari malformation ([place]), Asthma, CAD (coronary artery disease), COPD (chronic obstructive pulmonary disease) ([facility]), Coughing, DJD (degenerative joint disease), GERD (gastroesophageal reflux disease), History of absence seizures, colonic polyps, Hypercholesteremia, Hypertension, Obesity, Positive PPD, Reflex  sympathetic dystrophy, Varicose veins, and Venous insufficiency.  Vitamin D deficiency . Last vitamin D Lab Results  Component Value Date   VD25OH 45.09 03/13/2021   Stable, cont oral replacement   Anxiety Improved recently, states not having to take recent prn med,  to f/u any worsening symptoms or concerns  Hypercholesteremia Lab Results  Component Value Date   LDLCALC 83 11/07/2020   Stable, pt to continue current statin crestor 40   Essential hypertension BP Readings from Last 3 Encounters:  05/07/21 114/74  04/23/21 121/74  04/09/21 100/62   Stable, pt to continue medical treatment lasix   Allergic rhinitis Mild seasonal flare, for restart singulair qd  Foot-drop Recent onset, for AFO fitting later today  Followup: Return in about 6 months (around 11/06/2021).  Cathlean Cower, MD 05/08/2021 9:23 PM [facility]

## 2021-05-07 NOTE — Assessment & Plan Note (Signed)
.   Last vitamin D Lab Results  Component Value Date   VD25OH 45.09 03/13/2021   Stable, cont oral replacement

## 2021-05-07 NOTE — Patient Instructions (Signed)
Please continue all other medications as before, and refills have been done if requested including singulair  Please have the pharmacy call with any other refills you may need.  Please continue your efforts at being more active, low cholesterol diet, and weight control.  You are otherwise up to date with prevention measures today.  Please keep your appointments with your specialists as you may have planned

## 2021-05-07 NOTE — Assessment & Plan Note (Signed)
BP Readings from Last 3 Encounters:  05/07/21 114/74  04/23/21 121/74  04/09/21 100/62   Stable, pt to continue medical treatment lasix

## 2021-05-07 NOTE — Assessment & Plan Note (Signed)
Improved recently, states not having to take recent prn med,  to f/u any worsening symptoms or concerns

## 2021-05-07 NOTE — Assessment & Plan Note (Signed)
Lab Results  Component Value Date   LDLCALC 83 11/07/2020   Stable, pt to continue current statin crestor 40

## 2021-05-07 NOTE — Assessment & Plan Note (Signed)
Mild seasonal flare, for restart singulair qd

## 2021-05-08 ENCOUNTER — Encounter: Payer: Self-pay | Admitting: Internal Medicine

## 2021-05-08 NOTE — Assessment & Plan Note (Signed)
Recent onset, for AFO fitting later today

## 2021-05-13 NOTE — Progress Notes (Deleted)
HPI: FU coronary disease. Cardiac catheterization 2/05 showed mild irregularities in the left main. There was a 40% stenosis in the proximal LAD and a distal 30% lesion. The first diagonal had a 30-40% proximal lesion. The ostium of the LAD by intravascular ultrasound had a luminal area of 3.9 mm squared and percent stenosis 60%. She had nonobstructive disease in the left circumflex and right coronary. Her LV function was normal. Nuclear study 1/16 showed EF 58 and normal perfusion. Echocardiogram April 2019 showed normal LV function, mild left ventricular hypertrophy, grade 1 diastolic dysfunction, mild right atrial and right ventricular enlargement and mild tricuspid regurgitation.  ABIs May 2019 normal.  Venous Dopplers November 2021 showed no DVT.  Since I last saw her,   Current Outpatient Medications  Medication Sig Dispense Refill   albuterol (PROVENTIL HFA;VENTOLIN HFA) 108 (90 BASE) MCG/ACT inhaler Inhale 2 puffs into the lungs every 4 (four) hours as needed. For wheeze or shortness of breath 1 Inhaler 6   alendronate (FOSAMAX) 70 MG tablet Take 1 tablet (70 mg total) by mouth every 7 (seven) days. Take with a full glass of water on an empty stomach. 12 tablet 3   aspirin 81 MG tablet Take 81 mg by mouth daily.     azelastine (ASTELIN) 0.1 % nasal spray Place 1 spray into both nostrils daily as needed for rhinitis or allergies.      budesonide-formoterol (SYMBICORT) 160-4.5 MCG/ACT inhaler      citalopram (CELEXA) 10 MG tablet Take 1 tablet (10 mg total) by mouth daily. 90 tablet 3   [facility]S D3 50 MCG (2000 UT) CAPS Take 1 capsule by mouth daily.     donepezil (ARICEPT ODT) 5 MG disintegrating tablet Take 1 tablet (5 mg total) by mouth at bedtime. 90 tablet 3   EPINEPHrine 0.3 mg/0.3 mL IJ SOAJ injection Inject 0.3 mg into the muscle as needed for anaphylaxis.     famotidine (PEPCID) 20 MG tablet Take 1 tablet (20 mg total) by mouth 2 (two) times daily. 180 tablet 3   ferrous sulfate 325  (65 FE) MG EC tablet Take 325 mg by mouth daily.     fluticasone (FLONASE) 50 MCG/ACT nasal spray Place 1 spray into both nostrils 2 (two) times daily as needed for allergies or rhinitis.     furosemide (LASIX) 40 MG tablet Take 1 tablet (40 mg total) by mouth daily. 30 tablet 0   meloxicam (MOBIC) 15 MG tablet TAKE 1 TABLET BY MOUTH EVERY DAY AS NEEDED FOR PAIN 30 tablet 5   montelukast (SINGULAIR) 10 MG tablet Take 1 tablet (10 mg total) by mouth every evening. 90 tablet 3   oxyCODONE-acetaminophen (PERCOCET) 10-325 MG tablet Take 1 tablet by mouth 3 (three) times daily as needed for pain.     pantoprazole (PROTONIX) 40 MG tablet Take 1 tablet (40 mg total) by mouth daily. 90 tablet 3   potassium chloride SA (KLOR-CON M20) 20 MEQ tablet Take 1 tablet (20 mEq total) by mouth 2 (two) times daily. 180 tablet 3   rosuvastatin (CRESTOR) 40 MG tablet Take 1 tablet (40 mg total) by mouth daily. 90 tablet 3   Sodium Chloride-Sodium Bicarb (SINUS [place] SQUEEZE BOTTLE) 2300-700 MG KIT See admin instructions.     Tiotropium Bromide Monohydrate (SPIRIVA RESPIMAT) 1.25 MCG/ACT AERS Inhale 1.25 mcg into the lungs daily.     tiZANidine (ZANAFLEX) 2 MG tablet TAKE 1 TABLET BY MOUTH EVERY 6 HOURS AS NEEDED FOR MUSCLE SPASMS.  40 tablet 1   traMADol (ULTRAM) 50 MG tablet Take 1 tablet (50 mg total) by mouth every 6 (six) hours as needed. 15 tablet 0   TRAMADOL HCL PO tramadol     No current facility-administered medications for this visit.     Past Medical History:  Diagnosis Date   Allergic rhinitis    Arnold-Chiari malformation ([facility])    Asthma    CAD (coronary artery disease)    COPD (chronic obstructive pulmonary disease) ([facility])    Coughing    coughing since last 01-22-2019 , started on cefdinir bid x10 days, has 3 pills left today , reports she feels mcuh better , still coughing copiuus amonts of thick white sputum , denies fever nor chills, nor body aches .    DJD (degenerative joint disease)    GERD  (gastroesophageal reflux disease)    History of absence seizures    last confirmed seizure age 44     Hx of colonic polyps    Hypercholesteremia    Hypertension    Obesity    Positive PPD    Reflex sympathetic dystrophy    Varicose veins    Venous insufficiency     Past Surgical History:  Procedure Laterality Date   ABDOMINAL HYSTERECTOMY     cspine surgery  1993   for arnold-chiari malformation   JOINT REPLACEMENT  06/05/11   Left total knee replacement   right knee arthroscopy  12/2007   Dr. Tonita Cong   right total knee replacement  05/2008   Dr. Tonita Cong   TOTAL KNEE REVISION Left 02/03/2019   Procedure: LEFT TOTAL KNEE REVISION;  Surgeon: Rod Can, MD;  Location: [facility];  Service: Orthopedics;  Laterality: Left;    Social History   Socioeconomic History   Marital status: Widowed    Spouse name: Not on file   Number of children: 2   Years of education: 63   Highest education level: Not on file  Occupational History   Occupation: retired  Tobacco Use   Smoking status: Former    Packs/day: 0.30    Years: 12.00    Pack years: 3.60    Types: Cigarettes    Quit date: 01/02/2012    Years since quitting: 9.3   Smokeless tobacco: Never   Tobacco comments:    1 pack per week  Vaping Use   Vaping Use: Never used  Substance and Sexual Activity   Alcohol use: No   Drug use: No   Sexual activity: Not on file  Other Topics Concern   Not on file  Social History Narrative   Fun/Hobby: Playing cards and mingling.   Denies abuse and feels safe at home.    Social Determinants of Health   Financial Resource Strain: Not on file  Food Insecurity: Not on file  Transportation Needs: Not on file  Physical Activity: Not on file  Stress: Not on file  Social Connections: Not on file  Intimate Partner Violence: Not on file    Family History  Problem Relation Age of Onset   Heart disease Mother    Stroke Mother    Stroke Father    Clotting disorder Father    Colon cancer  Neg Hx    Stomach cancer Neg Hx     ROS: no fevers or chills, productive cough, hemoptysis, dysphasia, odynophagia, melena, hematochezia, dysuria, hematuria, rash, seizure activity, orthopnea, PND, pedal edema, claudication. Remaining systems are negative.  Physical Exam: Well-developed well-nourished in no acute distress.  Skin  is warm and dry.  HEENT is normal.  Neck is supple.  Chest is clear to auscultation with normal expansion.  Cardiovascular exam is regular rate and rhythm.  Abdominal exam nontender or distended. No masses palpated. Extremities show no edema. neuro grossly intact  ECG- personally reviewed  A/P  1 coronary artery disease-patient denies chest pain.  Continue aspirin and statin.  2 hypertension-blood pressure controlled.  Continue present medical regimen.  3 hyperlipidemia-continue Crestor at present dose.  Check lipids and liver.  4 lower extremity edema-continue Lasix at present dose.  Kirk Ruths, MD

## 2021-05-23 ENCOUNTER — Ambulatory Visit: Payer: BC Managed Care – PPO | Admitting: Cardiology

## 2021-05-29 ENCOUNTER — Other Ambulatory Visit: Payer: BC Managed Care – PPO

## 2021-05-30 ENCOUNTER — Other Ambulatory Visit: Payer: Self-pay | Admitting: Podiatrist

## 2021-05-30 DIAGNOSIS — M7130 Other bursal cyst, unspecified site: Secondary | ICD-10-CM

## 2021-06-04 ENCOUNTER — Encounter: Payer: Self-pay | Admitting: Podiatry

## 2021-06-04 ENCOUNTER — Other Ambulatory Visit: Payer: Self-pay

## 2021-06-04 ENCOUNTER — Ambulatory Visit (INDEPENDENT_AMBULATORY_CARE_PROVIDER_SITE_OTHER): Payer: BC Managed Care – PPO | Admitting: Podiatry

## 2021-06-04 DIAGNOSIS — M79674 Pain in right toe(s): Secondary | ICD-10-CM | POA: Diagnosis not present

## 2021-06-04 DIAGNOSIS — I739 Peripheral vascular disease, unspecified: Secondary | ICD-10-CM | POA: Diagnosis not present

## 2021-06-04 DIAGNOSIS — M79675 Pain in left toe(s): Secondary | ICD-10-CM | POA: Diagnosis not present

## 2021-06-04 DIAGNOSIS — M71372 Other bursal cyst, left ankle and foot: Secondary | ICD-10-CM

## 2021-06-04 DIAGNOSIS — B351 Tinea unguium: Secondary | ICD-10-CM | POA: Diagnosis not present

## 2021-06-07 ENCOUNTER — Ambulatory Visit: Payer: BC Managed Care – PPO | Admitting: Internal Medicine

## 2021-06-07 ENCOUNTER — Encounter: Payer: Self-pay | Admitting: Internal Medicine

## 2021-06-07 ENCOUNTER — Other Ambulatory Visit: Payer: Self-pay

## 2021-06-07 VITALS — BP 124/70 | HR 51 | Temp 98.3°F | Ht 63.0 in | Wt 211.0 lb

## 2021-06-07 DIAGNOSIS — E7800 Pure hypercholesterolemia, unspecified: Secondary | ICD-10-CM

## 2021-06-07 DIAGNOSIS — E559 Vitamin D deficiency, unspecified: Secondary | ICD-10-CM | POA: Diagnosis not present

## 2021-06-07 DIAGNOSIS — M542 Cervicalgia: Secondary | ICD-10-CM

## 2021-06-07 DIAGNOSIS — I10 Essential (primary) hypertension: Secondary | ICD-10-CM

## 2021-06-07 DIAGNOSIS — E876 Hypokalemia: Secondary | ICD-10-CM

## 2021-06-07 LAB — HEPATIC FUNCTION PANEL
ALT: 13 U/L (ref 0–35)
AST: 17 U/L (ref 0–37)
Albumin: 4 g/dL (ref 3.5–5.2)
Alkaline Phosphatase: 54 U/L (ref 39–117)
Bilirubin, Direct: 0.1 mg/dL (ref 0.0–0.3)
Total Bilirubin: 0.4 mg/dL (ref 0.2–1.2)
Total Protein: 7.2 g/dL (ref 6.0–8.3)

## 2021-06-07 LAB — CBC WITH DIFFERENTIAL/PLATELET
Basophils Absolute: 0 10*3/uL (ref 0.0–0.1)
Basophils Relative: 1.2 % (ref 0.0–3.0)
Eosinophils Absolute: 0.1 10*3/uL (ref 0.0–0.7)
Eosinophils Relative: 1.9 % (ref 0.0–5.0)
HCT: 34.7 % — ABNORMAL LOW (ref 36.0–46.0)
Hemoglobin: 11.3 g/dL — ABNORMAL LOW (ref 12.0–15.0)
Lymphocytes Relative: 30 % (ref 12.0–46.0)
Lymphs Abs: 0.9 10*3/uL (ref 0.7–4.0)
MCHC: 32.5 g/dL (ref 30.0–36.0)
MCV: 80.9 fl (ref 78.0–100.0)
Monocytes Absolute: 0.3 10*3/uL (ref 0.1–1.0)
Monocytes Relative: 8.7 % (ref 3.0–12.0)
Neutro Abs: 1.8 10*3/uL (ref 1.4–7.7)
Neutrophils Relative %: 58.2 % (ref 43.0–77.0)
Platelets: 157 10*3/uL (ref 150.0–400.0)
RBC: 4.29 Mil/uL (ref 3.87–5.11)
RDW: 15.9 % — ABNORMAL HIGH (ref 11.5–15.5)
WBC: 3.1 10*3/uL — ABNORMAL LOW (ref 4.0–10.5)

## 2021-06-07 LAB — URINALYSIS, ROUTINE W REFLEX MICROSCOPIC
Bilirubin Urine: NEGATIVE
Hgb urine dipstick: NEGATIVE
Ketones, ur: NEGATIVE
Leukocytes,Ua: NEGATIVE
Nitrite: NEGATIVE
Specific Gravity, Urine: 1.02 (ref 1.000–1.030)
Total Protein, Urine: 30 — AB
Urine Glucose: NEGATIVE
Urobilinogen, UA: 1 (ref 0.0–1.0)
pH: 7 (ref 5.0–8.0)

## 2021-06-07 LAB — BASIC METABOLIC PANEL
BUN: 17 mg/dL (ref 6–23)
CO2: 27 mEq/L (ref 19–32)
Calcium: 9.6 mg/dL (ref 8.4–10.5)
Chloride: 106 mEq/L (ref 96–112)
Creatinine, Ser: 0.98 mg/dL (ref 0.40–1.20)
GFR: 55.33 mL/min — ABNORMAL LOW (ref >60.00)
Glucose, Bld: 89 mg/dL (ref 70–99)
Potassium: 4.8 mEq/L (ref 3.5–5.1)
Sodium: 140 mEq/L (ref 135–145)

## 2021-06-07 MED ORDER — TRAMADOL HCL 50 MG PO TABS: 50.0000 mg | ORAL_TABLET | Freq: Four times a day (QID) | ORAL | 1 refills | Status: DC | PRN

## 2021-06-07 MED ORDER — PREDNISONE 10 MG PO TABS: ORAL_TABLET | ORAL | 0 refills | Status: DC

## 2021-06-07 MED ORDER — TIZANIDINE HCL 2 MG PO TABS: 2.0000 mg | ORAL_TABLET | Freq: Four times a day (QID) | ORAL | 1 refills | Status: AC | PRN

## 2021-06-07 NOTE — Patient Instructions (Addendum)
Please take all new medication as prescribed - the prednisone  Please continue all other medications as before, including the refills for the tramadol and muscle relaxer  Please return in 1-2 weeks if not better (or getting worse) to consider MRI  Please have the pharmacy call with any other refills you may need.  Please continue your efforts at being more active, low cholesterol diet, and weight control.  Please keep your appointments with your specialists as you may have planned  Please go to the LAB at the blood drawing area for the tests to be done  You will be contacted by phone if any changes need to be made immediately.  Otherwise, you will receive a letter about your results with an explanation, but please check with MyChart first.  Please remember to sign up for MyChart if you have not done so, as this will be important to you in the future with finding out test results, communicating by private email, and scheduling acute appointments online when needed.  Please make an Appointment to return in 6 months, or sooner if needed, also with Lab Appointment for testing done 3-5 days before at the [facility] (so this is for TWO appointments - please see the scheduling desk as you leave)  Due to the ongoing Covid 19 pandemic, our lab now requires an appointment for any labs done at our office.  If you need labs done and do not have an appointment, please call our office ahead of time to schedule before presenting to the lab for your testing.

## 2021-06-07 NOTE — Progress Notes (Signed)
Patient ID: [name], female   DOB: 13-Oct-1941, 80 y.o.   MRN: [id number]        Chief Complaint: follow up HTN, HLD, low vit d, post right neck pain, and low K       HPI:  [name] is a 80 y.o. female here with family who is concerned pt is more sleepy lately, fatigued and maybe her K is low again, asking for recheck.  Pt denies chest pain, increased sob or doe, wheezing, orthopnea, PND, increased LE swelling, palpitations, dizziness or syncope.   Pt denies polydipsia, polyuria, or new focal neuro s/s.  Taking Vit D.  Family working on trying to have her follow lower chol diet.   Pt denies fever, wt loss, night sweats, loss of appetite, or other constitutional symptoms, but does also have 1 wk onset right post neck pain without radiation to the UE or other, no rash or swelling, worse to lie at night, ran out of med for pain control, asking for refill, nothing else makes better or worse.        Wt Readings from Last 3 Encounters:  06/07/21 211 lb (95.7 kg)  05/07/21 214 lb (97.1 kg)  04/22/21 207 lb (93.9 kg)   BP Readings from Last 3 Encounters:  06/07/21 124/70  05/07/21 114/74  04/23/21 121/74         Past Medical History:  Diagnosis Date   Allergic rhinitis    Arnold-Chiari malformation (HCC)    Asthma    CAD (coronary artery disease)    COPD (chronic obstructive pulmonary disease) (HCC)    Coughing    coughing since last 01-22-2019 , started on cefdinir bid x10 days, has 3 pills left today , reports she feels mcuh better , still coughing copiuus amonts of thick white sputum , denies fever nor chills, nor body aches .    DJD (degenerative joint disease)    GERD (gastroesophageal reflux disease)    History of absence seizures    last confirmed seizure age 22     Hx of colonic polyps    Hypercholesteremia    Hypertension    Obesity    Positive PPD    Reflex sympathetic dystrophy    Varicose veins    Venous insufficiency    Past Surgical History:  Procedure  Laterality Date   ABDOMINAL HYSTERECTOMY     cspine surgery  1993   for arnold-chiari malformation   JOINT REPLACEMENT  06/05/11   Left total knee replacement   right knee arthroscopy  12/2007   Dr. Tonita Cong   right total knee replacement  05/2008   Dr. Tonita Cong   TOTAL KNEE REVISION Left 02/03/2019   Procedure: LEFT TOTAL KNEE REVISION;  Surgeon: Rod Can, MD;  Location: [facility];  Service: Orthopedics;  Laterality: Left;    reports that she quit smoking about 9 years ago. Her smoking use included cigarettes. She has a 3.60 pack-year smoking history. She has never used smokeless tobacco. She reports that she does not drink alcohol and does not use drugs. family history includes Clotting disorder in her father; Heart disease in her mother; Stroke in her father and mother. No Known Allergies Current Outpatient Medications on File Prior to Visit  Medication Sig Dispense Refill   albuterol (PROVENTIL HFA;VENTOLIN HFA) 108 (90 BASE) MCG/ACT inhaler Inhale 2 puffs into the lungs every 4 (four) hours as needed. For wheeze or shortness of breath 1 Inhaler 6   alendronate (FOSAMAX) 70 MG tablet  Take 1 tablet (70 mg total) by mouth every 7 (seven) days. Take with a full glass of water on an empty stomach. 12 tablet 3   aspirin 81 MG tablet Take 81 mg by mouth daily.     azelastine (ASTELIN) 0.1 % nasal spray Place 1 spray into both nostrils daily as needed for rhinitis or allergies.      budesonide-formoterol (SYMBICORT) 160-4.5 MCG/ACT inhaler      citalopram (CELEXA) 10 MG tablet Take 1 tablet (10 mg total) by mouth daily. 90 tablet 3   [facility] D3 50 MCG (2000 UT) CAPS Take 1 capsule by mouth daily.     donepezil (ARICEPT ODT) 5 MG disintegrating tablet Take 1 tablet (5 mg total) by mouth at bedtime. 90 tablet 3   EPINEPHrine 0.3 mg/0.3 mL IJ SOAJ injection Inject 0.3 mg into the muscle as needed for anaphylaxis.     famotidine (PEPCID) 20 MG tablet Take 1 tablet (20 mg total) by mouth 2 (two) times daily.  180 tablet 3   ferrous sulfate 325 (65 FE) MG EC tablet Take 325 mg by mouth daily.     fluticasone (FLONASE) 50 MCG/ACT nasal spray Place 1 spray into both nostrils 2 (two) times daily as needed for allergies or rhinitis.     meloxicam (MOBIC) 15 MG tablet TAKE 1 TABLET BY MOUTH EVERY DAY AS NEEDED FOR PAIN 30 tablet 5   montelukast (SINGULAIR) 10 MG tablet Take 1 tablet (10 mg total) by mouth every evening. 90 tablet 3   oxyCODONE-acetaminophen (PERCOCET) 10-325 MG tablet Take 1 tablet by mouth 3 (three) times daily as needed for pain.     pantoprazole (PROTONIX) 40 MG tablet Take 1 tablet (40 mg total) by mouth daily. 90 tablet 3   potassium chloride SA (KLOR-CON M20) 20 MEQ tablet Take 1 tablet (20 mEq total) by mouth 2 (two) times daily. 180 tablet 3   rosuvastatin (CRESTOR) 40 MG tablet Take 1 tablet (40 mg total) by mouth daily. 90 tablet 3   Sodium Chloride-Sodium Bicarb (SINUS [place] SQUEEZE BOTTLE) 2300-700 MG KIT See admin instructions.     Tiotropium Bromide Monohydrate (SPIRIVA RESPIMAT) 1.25 MCG/ACT AERS Inhale 1.25 mcg into the lungs daily.     TRAMADOL HCL PO tramadol     furosemide (LASIX) 40 MG tablet Take 1 tablet (40 mg total) by mouth daily. 30 tablet 0   No current facility-administered medications on file prior to visit.        ROS:  All others reviewed and negative.  Objective        PE:  BP 124/70 (BP Location: Left Arm, Patient Position: Sitting, Cuff Size: Large)   Pulse (!) 51   Temp 98.3 F (36.8 C) (Oral)   Ht $R'5\' 3"'vQ$  (1.6 m)   Wt 211 lb (95.7 kg)   SpO2 98%   BMI 37.38 kg/m                 Constitutional: Pt appears in NAD               HENT: Head: NCAT.                Right Ear: External ear normal.                 Left Ear: External ear normal.                Eyes: . Pupils are equal, round, and reactive to light. Conjunctivae and EOM  are normal               Nose: without d/c or deformity               Neck: Neck supple. Gross normal ROM                Cardiovascular: Normal rate and regular rhythm.                 Pulmonary/Chest: Effort normal and breath sounds without rales or wheezing.                Abd:  Soft, NT, ND, + BS, no organomegaly               Neurological: Pt is alert. At baseline orientation, motor grossly intact               Skin: Skin is warm. No rashes, no other new lesions, LE edema - none; right post lateral neck paracervical mod tender with spasm               Psychiatric: Pt behavior is normal without agitation   Micro: none  Cardiac tracings I have personally interpreted today:  none  Pertinent Radiological findings (summarize): none   Lab Results  Component Value Date   WBC 3.1 (L) 06/07/2021   HGB 11.3 (L) 06/07/2021   HCT 34.7 (L) 06/07/2021   PLT 157.0 06/07/2021   GLUCOSE 89 06/07/2021   CHOL 121 05/07/2021   TRIG 52.0 05/07/2021   HDL 72.30 05/07/2021   LDLCALC 38 05/07/2021   ALT 13 06/07/2021   AST 17 06/07/2021   NA 140 06/07/2021   K 4.8 06/07/2021   CL 106 06/07/2021   CREATININE 0.98 06/07/2021   BUN 17 06/07/2021   CO2 27 06/07/2021   TSH 1.63 05/07/2021   INR 1.14 06/16/2011   HGBA1C 5.7 11/07/2020   Assessment/Plan:  LADAJAH SOLTYS is a 80 y.o. Black or African American [2] female with  has a past medical history of Allergic rhinitis, Arnold-Chiari malformation ([place]), Asthma, CAD (coronary artery disease), COPD (chronic obstructive pulmonary disease) (HCC), Coughing, DJD (degenerative joint disease), GERD (gastroesophageal reflux disease), History of absence seizures, colonic polyps, Hypercholesteremia, Hypertension, Obesity, Positive PPD, Reflex sympathetic dystrophy, Varicose veins, and Venous insufficiency.  Hypokalemia Ok for f/u lab,  to f/u any worsening symptoms or concerns  Posterior neck pain C/w msk strain with underlying djd, for tramadol prn, predpac asd, and tizanidine prn,  to f/u any worsening symptoms or concerns  Essential hypertension BP Readings from Last 3  Encounters:  06/07/21 124/70  05/07/21 114/74  04/23/21 121/74   Stable, pt to continue medical treatment  - diet , wt control   Hypercholesteremia Lab Results  Component Value Date   LDLCALC 38 05/07/2021   Stable, pt to continue current statin crestor   Vitamin D deficiency Last vitamin D Lab Results  Component Value Date   VD25OH 36.22 05/07/2021   Stable, cont oral replacement  Followup: Return in about 6 months (around 12/08/2021).  Cathlean Cower, MD 06/11/2021 8:22 PM [facility]

## 2021-06-08 NOTE — Progress Notes (Signed)
  Subjective:  Patient ID: [name], female    DOB: Sep 15, 1941,  MRN: [id number]  Chief Complaint  Patient presents with   Foot Pain     recheck of heel cyst/ bursae   Nail Problem    Thick painful toenails, 3 month follow up    80 y.o. female presents with the above complaint. History confirmed with patient. She saw Dr. Rolley Sims few weeks ago for a painful bursa in the left heel.  Feeling somewhat better with injection.  Still somewhat tender.  Nails are also thickened elongated and painful for her.  Objective:  Physical Exam: warm, good capillary refill, no trophic changes or ulcerative lesions, weakly palpable DP and PT pulses, and normal sensory exam. Left Foot: point tenderness over the heel pad and dystrophic yellowed discolored nail plates with subungual debris Right Foot: dystrophic yellowed discolored nail plates with subungual debris   Assessment:   1. Other bursal cyst, left ankle and foot   2. Pain due to onychomycosis of toenails of both feet   3. PVD (peripheral vascular disease) ([place])      Plan:  Patient was evaluated and treated and all questions answered.  Heel pad is improving, I do think the injection helped.  I think is too early to inject again I would not want her to get any side effects including fat pad thinning from the corticosteroids.  I did dispense a gel heel pad for her to wear to alleviate pain and pressure.  Discussed the etiology and treatment options for the condition in detail with the patient. Educated patient on the topical and oral treatment options for mycotic nails. Recommended debridement of the nails today. Sharp and mechanical debridement performed of all painful and mycotic nails today. Nails debrided in length and thickness using a nail nipper to level of comfort. Discussed treatment options including appropriate shoe gear. Follow up as needed for painful nails.    Return in about 3 months (around 09/04/2021) for routine foot care .

## 2021-06-11 ENCOUNTER — Encounter: Payer: Self-pay | Admitting: Internal Medicine

## 2021-06-11 NOTE — Assessment & Plan Note (Signed)
[place] for f/u lab,  to f/u any worsening symptoms or concerns

## 2021-06-11 NOTE — Assessment & Plan Note (Signed)
BP Readings from Last 3 Encounters:  06/07/21 124/70  05/07/21 114/74  04/23/21 121/74   Stable, pt to continue medical treatment  - diet , wt control

## 2021-06-11 NOTE — Assessment & Plan Note (Signed)
C/w msk strain with underlying djd, for tramadol prn, predpac asd, and tizanidine prn,  to f/u any worsening symptoms or concerns

## 2021-06-11 NOTE — Assessment & Plan Note (Signed)
Last vitamin D Lab Results  Component Value Date   VD25OH 36.22 05/07/2021   Stable, cont oral replacement

## 2021-06-11 NOTE — Assessment & Plan Note (Signed)
Lab Results  Component Value Date   LDLCALC 38 05/07/2021   Stable, pt to continue current statin crestor

## 2021-06-14 ENCOUNTER — Telehealth: Payer: Self-pay | Admitting: Internal Medicine

## 2021-06-14 NOTE — Telephone Encounter (Signed)
According to OV note on 06/07/21, pt was to notify MD if not getting better or getting worse.  Pt states she is better.  Pt advised that to let us know if her symptoms return; continue to take additional medication as needed.  Pt verb understanding & has no further ques/concerns at this time.

## 2021-06-14 NOTE — Telephone Encounter (Signed)
   Patient called and said that  predniSONE (DELTASONE) 10 MG tablet Did help. She was wondering what she needed to do next

## 2021-06-18 ENCOUNTER — Ambulatory Visit: Payer: Medicare Other | Admitting: Podiatry

## 2021-07-02 LAB — HM MAMMOGRAPHY

## 2021-07-04 ENCOUNTER — Encounter: Payer: Self-pay | Admitting: Internal Medicine

## 2021-08-20 ENCOUNTER — Telehealth: Payer: Self-pay | Admitting: Internal Medicine

## 2021-08-20 MED ORDER — TRAMADOL HCL 50 MG PO TABS: 50.0000 mg | ORAL_TABLET | Freq: Four times a day (QID) | ORAL | 1 refills | Status: DC | PRN

## 2021-08-20 NOTE — Telephone Encounter (Signed)
1.Medication Requested: oxyCODONE-acetaminophen (PERCOCET) 10-325 MG tablet traMADol (ULTRAM) 50 MG tablet   2. Pharmacy (Name, [place]): [id number] - [place], [place] - [address]  Phone:  [phone] Fax:  [phone]   3. On Med List: yes  4. Last Visit with PCP: 07.08.22  5. Next visit date with PCP: 01.10.23   Agent: Please be advised that RX refills may take up to 3 business days. We ask that you follow-up with your pharmacy.

## 2021-08-20 NOTE — Telephone Encounter (Signed)
[place] for tramadol only as they are both controlled substances for pain (cannot refil percocet too) -

## 2021-08-20 NOTE — Telephone Encounter (Signed)
PMP done; Percocet last filled 04/25/21 and Tramadol 06/07/21

## 2021-08-23 NOTE — Telephone Encounter (Signed)
traZODone (DESYREL) 100 MG tablet [[id number]]   [id number] - [place], [place]  [address], [place] [address]   Patient asked to have this medication refilled also as it helps her sleep at night  This medication is not on her current med list

## 2021-08-26 MED ORDER — TRAZODONE HCL 100 MG PO TABS: 100.0000 mg | ORAL_TABLET | Freq: Every day | ORAL | 1 refills | Status: DC

## 2021-08-26 NOTE — Telephone Encounter (Signed)
[facility] 

## 2021-08-26 NOTE — Telephone Encounter (Signed)
Patient notified

## 2021-09-09 ENCOUNTER — Ambulatory Visit (INDEPENDENT_AMBULATORY_CARE_PROVIDER_SITE_OTHER): Payer: BC Managed Care – PPO | Admitting: Podiatry

## 2021-09-09 ENCOUNTER — Other Ambulatory Visit: Payer: Self-pay

## 2021-09-09 ENCOUNTER — Encounter: Payer: Self-pay | Admitting: Podiatry

## 2021-09-09 DIAGNOSIS — M79674 Pain in right toe(s): Secondary | ICD-10-CM | POA: Diagnosis not present

## 2021-09-09 DIAGNOSIS — B351 Tinea unguium: Secondary | ICD-10-CM

## 2021-09-09 DIAGNOSIS — M79675 Pain in left toe(s): Secondary | ICD-10-CM | POA: Diagnosis not present

## 2021-09-09 DIAGNOSIS — Z89422 Acquired absence of other left toe(s): Secondary | ICD-10-CM

## 2021-09-14 NOTE — Progress Notes (Signed)
Subjective: [name] is a 80 y.o. female patient seen today for follow up of  painful thick toenails that are difficult to trim. Pain interferes with ambulation. Aggravating factors include wearing enclosed shoe gear. Pain is relieved with periodic professional debridement.  New problems reported today: Patient still c/o heel pain. She was found to have a painful bursa of the left heel. She received a steroid injection by Dr. Valentina [name] and has also seen Dr. Sherryle [name]. At Dr. Maxie [name] visit, he felt it was too soon for another injection. She states she has been wearing her heel protectors in bed, but not when sitting up in her recliner  PCP is April Borg, MD. Last visit was: 06/07/2021.  No Known Allergies  PCP is April Borg, MD .  Objective: Physical Exam  General: Patient is a pleasant 80 y.o. African American female in NAD. AAO x 3.   Neurovascular Examination: Capillary fill time to digits <3 seconds b/l lower extremities. Palpable DP pulse(s) b/l lower extremities Palpable PT pulse(s) b/l lower extremities Pedal hair absent. Lower extremity skin temperature gradient within normal limits. No pain with calf compression b/l.  Protective sensation intact 5/5 intact bilaterally with 10g monofilament b/l. Vibratory sensation intact b/l.  Dermatological:  Skin warm and supple b/l lower extremities. No open wounds b/l LE. No interdigital macerations b/l lower extremities. Toenails L hallux, L 4th toe, L 5th toe, R hallux, R 3rd toe, R 4th toe, and R 5th toe elongated, discolored, dystrophic, thickened, and crumbly with subungual debris and tenderness to dorsal palpation. Anonychia noted L 3rd toe and R 2nd toe. Nailbed(s) epithelialized.   Musculoskeletal:  Normal muscle strength 5/5 to all lower extremity muscle groups bilaterally. Lower extremity amputation(s): digital amputation L 2nd toe.  Assessment: 1. Pain due to onychomycosis of toenails of both feet   2. Status post  amputation of lesser toe of left foot ([place])    Plan: Patient was evaluated and treated and all questions answered. Consent given for treatment as described below: -Examined patient. -Patient advised to continue heel protectors in bed and also start wearing when she is in recliner. If she continues to have pain, may schedule appointment with Dr. Sherryle [name] or Dr. Valentina [name]. -Patient to continue soft, supportive shoe gear daily. -Toenails L hallux, L 4th toe, L 5th toe, R hallux, R 3rd toe, R 4th toe, and R 5th toe debrided in length and girth without iatrogenic bleeding with sterile nail nipper and dremel.  -Patient to report any pedal injuries to medical professional immediately. -Patient/POA to call should there be question/concern in the interim.  Return in about 3 months (around 12/10/2021).  [name], DPM

## 2021-09-17 NOTE — Progress Notes (Signed)
HPI: FU coronary disease. Cardiac catheterization 2/05 showed mild irregularities in the left main. There was a 40% stenosis in the proximal LAD and a distal 30% lesion. The first diagonal had a 30-40% proximal lesion. The ostium of the LAD by intravascular ultrasound had a luminal area of 3.9 mm squared and percent stenosis 60%. She had nonobstructive disease in the left circumflex and right coronary. Her LV function was normal. Nuclear study 1/16 showed EF 58 and normal perfusion. Echocardiogram April 2019 showed normal LV function, mild left ventricular hypertrophy, grade 1 diastolic dysfunction, mild right atrial and right ventricular enlargement and mild tricuspid regurgitation.  ABIs May 2019 normal.  Venous Dopplers November 2021 showed no DVT.  Since I last saw her, she denies dyspnea, chest pain, palpitations or syncope.  She has chronic mild pedal edema that she takes Lasix for.  Current Outpatient Medications  Medication Sig Dispense Refill   albuterol (PROVENTIL HFA;VENTOLIN HFA) 108 (90 BASE) MCG/ACT inhaler Inhale 2 puffs into the lungs every 4 (four) hours as needed. For wheeze or shortness of breath 1 Inhaler 6   alendronate (FOSAMAX) 70 MG tablet Take 1 tablet (70 mg total) by mouth every 7 (seven) days. Take with a full glass of water on an empty stomach. 12 tablet 3   aspirin 81 MG tablet Take 81 mg by mouth daily.     azelastine (ASTELIN) 0.1 % nasal spray Place 1 spray into both nostrils daily as needed for rhinitis or allergies.      budesonide-formoterol (SYMBICORT) 160-4.5 MCG/ACT inhaler      EPINEPHrine 0.3 mg/0.3 mL IJ SOAJ injection Inject 0.3 mg into the muscle as needed for anaphylaxis.     famotidine (PEPCID) 20 MG tablet Take 1 tablet (20 mg total) by mouth 2 (two) times daily. 180 tablet 3   ferrous sulfate 325 (65 FE) MG EC tablet Take 325 mg by mouth daily.     fluticasone (FLONASE) 50 MCG/ACT nasal spray Place 1 spray into both nostrils 2 (two) times daily as  needed for allergies or rhinitis.     meloxicam (MOBIC) 15 MG tablet TAKE 1 TABLET BY MOUTH EVERY DAY AS NEEDED FOR PAIN 30 tablet 5   montelukast (SINGULAIR) 10 MG tablet Take 1 tablet (10 mg total) by mouth every evening. 90 tablet 3   oxyCODONE-acetaminophen (PERCOCET) 10-325 MG tablet Take 1 tablet by mouth 3 (three) times daily as needed for pain.     pantoprazole (PROTONIX) 40 MG tablet Take 1 tablet (40 mg total) by mouth daily. 90 tablet 3   potassium chloride SA (KLOR-CON M20) 20 MEQ tablet Take 1 tablet (20 mEq total) by mouth 2 (two) times daily. 180 tablet 3   predniSONE (DELTASONE) 10 MG tablet 3 tabs by mouth per day for 3 days,2tabs per day for 3 days,1tab per day for 3 days 18 tablet 0   Sodium Chloride-Sodium Bicarb (SINUS [place] SQUEEZE BOTTLE) 2300-700 MG KIT See admin instructions.     Tiotropium Bromide Monohydrate (SPIRIVA RESPIMAT) 1.25 MCG/ACT AERS Inhale 1.25 mcg into the lungs daily.     tiZANidine (ZANAFLEX) 2 MG tablet Take 1 tablet (2 mg total) by mouth every 6 (six) hours as needed for muscle spasms. 40 tablet 1   traMADol (ULTRAM) 50 MG tablet Take 1 tablet (50 mg total) by mouth every 6 (six) hours as needed. 60 tablet 1   TRAMADOL HCL PO tramadol     traZODone (DESYREL) 100 MG tablet Take 1  tablet (100 mg total) by mouth at bedtime. 90 tablet 1   citalopram (CELEXA) 10 MG tablet Take 1 tablet (10 mg total) by mouth daily. (Patient not taking: Reported on 09/24/2021) 90 tablet 3   CVS D3 50 MCG (2000 UT) CAPS Take 1 capsule by mouth daily. (Patient not taking: Reported on 09/24/2021)     donepezil (ARICEPT ODT) 5 MG disintegrating tablet Take 1 tablet (5 mg total) by mouth at bedtime. (Patient not taking: Reported on 09/24/2021) 90 tablet 3   furosemide (LASIX) 40 MG tablet Take 1 tablet (40 mg total) by mouth daily. 30 tablet 0   rosuvastatin (CRESTOR) 40 MG tablet Take 1 tablet (40 mg total) by mouth daily. 90 tablet 3   No current facility-administered medications  for this visit.     Past Medical History:  Diagnosis Date   Allergic rhinitis    Arnold-Chiari malformation (HCC)    Asthma    CAD (coronary artery disease)    COPD (chronic obstructive pulmonary disease) (HCC)    Coughing    coughing since last 01-22-2019 , started on cefdinir bid x10 days, has 3 pills left today , reports she feels mcuh better , still coughing copiuus amonts of thick white sputum , denies fever nor chills, nor body aches .    DJD (degenerative joint disease)    GERD (gastroesophageal reflux disease)    History of absence seizures    last confirmed seizure age 64     Hx of colonic polyps    Hypercholesteremia    Hypertension    Obesity    Positive PPD    Reflex sympathetic dystrophy    Varicose veins    Venous insufficiency     Past Surgical History:  Procedure Laterality Date   ABDOMINAL HYSTERECTOMY     cspine surgery  1993   for arnold-chiari malformation   JOINT REPLACEMENT  06/05/11   Left total knee replacement   right knee arthroscopy  12/2007   Dr. Tonita Cong   right total knee replacement  05/2008   Dr. Tonita Cong   TOTAL KNEE REVISION Left 02/03/2019   Procedure: LEFT TOTAL KNEE REVISION;  Surgeon: Rod Can, MD;  Location: [facility];  Service: Orthopedics;  Laterality: Left;    Social History   Socioeconomic History   Marital status: Widowed    Spouse name: Not on file   Number of children: 2   Years of education: 64   Highest education level: Not on file  Occupational History   Occupation: retired  Tobacco Use   Smoking status: Former    Packs/day: 0.30    Years: 12.00    Pack years: 3.60    Types: Cigarettes    Quit date: 01/02/2012    Years since quitting: 9.7   Smokeless tobacco: Never   Tobacco comments:    1 pack per week  Vaping Use   Vaping Use: Never used  Substance and Sexual Activity   Alcohol use: No   Drug use: No   Sexual activity: Not on file  Other Topics Concern   Not on file  Social History Narrative   Fun/Hobby:  Playing cards and mingling.   Denies abuse and feels safe at home.    Social Determinants of Health   Financial Resource Strain: Not on file  Food Insecurity: Not on file  Transportation Needs: Not on file  Physical Activity: Not on file  Stress: Not on file  Social Connections: Not on file  Intimate Partner Violence:  Not on file    Family History  Problem Relation Age of Onset   Heart disease Mother    Stroke Mother    Stroke Father    Clotting disorder Father    Colon cancer Neg Hx    Stomach cancer Neg Hx     ROS: Back pain and knee arthralgias but no fevers or chills, productive cough, hemoptysis, dysphasia, odynophagia, melena, hematochezia, dysuria, hematuria, rash, seizure activity, orthopnea, PND, claudication. Remaining systems are negative.  Physical Exam: Well-developed obese in no acute distress.  Skin is warm and dry.  HEENT is normal.  Neck is supple.  Chest is clear to auscultation with normal expansion.  Cardiovascular exam is regular rate and rhythm. 1/6 SEM Abdominal exam nontender or distended. No masses palpated. Extremities show trace to 1+ edema. neuro grossly intact   A/P  1 coronary artery disease-patient denies chest pain.  Continue aspirin and statin.  2 hypertension-patient's blood pressure is controlled today.  Continue present medical regimen.  3 hyperlipidemia-continue statin.  4 lower extremity edema-continue diuretic at present dose.  Kirk Ruths, MD

## 2021-09-24 ENCOUNTER — Other Ambulatory Visit: Payer: Self-pay

## 2021-09-24 ENCOUNTER — Telehealth: Payer: Self-pay | Admitting: Cardiology

## 2021-09-24 ENCOUNTER — Encounter: Payer: Self-pay | Admitting: Cardiology

## 2021-09-24 ENCOUNTER — Ambulatory Visit (INDEPENDENT_AMBULATORY_CARE_PROVIDER_SITE_OTHER): Payer: BC Managed Care – PPO | Admitting: Cardiology

## 2021-09-24 VITALS — BP 113/62 | HR 68 | Ht 64.0 in | Wt 221.0 lb

## 2021-09-24 DIAGNOSIS — R600 Localized edema: Secondary | ICD-10-CM

## 2021-09-24 DIAGNOSIS — I2510 Atherosclerotic heart disease of native coronary artery without angina pectoris: Secondary | ICD-10-CM | POA: Diagnosis not present

## 2021-09-24 DIAGNOSIS — E785 Hyperlipidemia, unspecified: Secondary | ICD-10-CM

## 2021-09-24 DIAGNOSIS — I10 Essential (primary) hypertension: Secondary | ICD-10-CM | POA: Diagnosis not present

## 2021-09-24 NOTE — Telephone Encounter (Signed)
Routed to MD/RN to review Patient was just in the office today

## 2021-09-24 NOTE — Patient Instructions (Signed)

## 2021-09-24 NOTE — Telephone Encounter (Signed)
Left message that MD said OK to take magnesium   [name], [name], MD  You 27 minutes ago (11:36 AM)   Yes  Kirk Ruths

## 2021-09-24 NOTE — Telephone Encounter (Signed)
Patient would like to know if it's okay for her to take a magnesium 500mg  tablet.

## 2021-10-28 ENCOUNTER — Other Ambulatory Visit: Payer: Self-pay | Admitting: Internal Medicine

## 2021-10-29 ENCOUNTER — Ambulatory Visit (INDEPENDENT_AMBULATORY_CARE_PROVIDER_SITE_OTHER): Payer: BC Managed Care – PPO | Admitting: Podiatry

## 2021-10-29 ENCOUNTER — Other Ambulatory Visit: Payer: Self-pay

## 2021-10-29 DIAGNOSIS — M722 Plantar fascial fibromatosis: Secondary | ICD-10-CM | POA: Diagnosis not present

## 2021-10-29 NOTE — Patient Instructions (Signed)

## 2021-10-29 NOTE — Progress Notes (Signed)
  Subjective:  Patient ID: [name], female    DOB: 02-27-1941,  MRN: [id number]  Chief Complaint  Patient presents with   Foot Pain      left foot heel pain    80 y.o. female presents with the above complaint. History confirmed with patient.  Pain is returned in the heel again  Objective:  Physical Exam: warm, good capillary refill, no trophic changes or ulcerative lesions, normal DP and PT pulses, and normal sensory exam. Left Foot: point tenderness over the heel pad and point tenderness of the mid plantar fascia Assessment:   1. Plantar fasciitis of left foot      Plan:  Patient was evaluated and treated and all questions answered.  Discussed the etiology and treatment options for plantar fasciitis including stretching, formal physical therapy, supportive shoegears such as a running shoe or sneaker, pre fabricated orthoses, injection therapy, and oral medications. We also discussed the role of surgical treatment of this for patients who do not improve after exhausting non-surgical treatment options.   -Educated patient on stretching and icing of the affected limb -Injection delivered to the plantar fascia of the left foot. -Home exercise plan given and recommend she do this twice daily.  After sterile prep with povidone-iodine solution and alcohol, the left heel was injected with 0.5cc 2% xylocaine plain, 0.5cc 0.5% marcaine plain, 5mg  triamcinolone acetonide, and 2mg  dexamethasone was injected along the medial plantar fascia at the insertion on the plantar calcaneus. The patient tolerated the procedure well without complication.   Return if symptoms worsen or fail to improve.

## 2021-11-06 ENCOUNTER — Ambulatory Visit: Payer: BC Managed Care – PPO | Admitting: Internal Medicine

## 2021-11-12 ENCOUNTER — Telehealth: Payer: Self-pay | Admitting: Internal Medicine

## 2021-11-12 DIAGNOSIS — E7800 Pure hypercholesterolemia, unspecified: Secondary | ICD-10-CM

## 2021-11-12 DIAGNOSIS — R739 Hyperglycemia, unspecified: Secondary | ICD-10-CM

## 2021-11-12 DIAGNOSIS — E559 Vitamin D deficiency, unspecified: Secondary | ICD-10-CM

## 2021-11-12 DIAGNOSIS — E538 Deficiency of other specified B group vitamins: Secondary | ICD-10-CM

## 2021-11-12 NOTE — Telephone Encounter (Signed)
Pt has 6 month f/u with PCP 1/10 Lab appt for 1/6  Please enter lab orders

## 2021-11-22 DIAGNOSIS — M19079 Primary osteoarthritis, unspecified ankle and foot: Secondary | ICD-10-CM | POA: Insufficient documentation

## 2021-11-22 DIAGNOSIS — M2012 Hallux valgus (acquired), left foot: Secondary | ICD-10-CM | POA: Insufficient documentation

## 2021-12-06 ENCOUNTER — Other Ambulatory Visit: Payer: BC Managed Care – PPO

## 2021-12-10 ENCOUNTER — Ambulatory Visit (INDEPENDENT_AMBULATORY_CARE_PROVIDER_SITE_OTHER): Payer: Medicare PPO | Admitting: Internal Medicine

## 2021-12-10 ENCOUNTER — Encounter: Payer: Self-pay | Admitting: Internal Medicine

## 2021-12-10 ENCOUNTER — Other Ambulatory Visit: Payer: Self-pay

## 2021-12-10 VITALS — BP 128/76 | HR 43 | Temp 98.1°F | Ht 64.0 in | Wt 222.0 lb

## 2021-12-10 DIAGNOSIS — J449 Chronic obstructive pulmonary disease, unspecified: Secondary | ICD-10-CM

## 2021-12-10 DIAGNOSIS — R001 Bradycardia, unspecified: Secondary | ICD-10-CM

## 2021-12-10 DIAGNOSIS — Z23 Encounter for immunization: Secondary | ICD-10-CM | POA: Diagnosis not present

## 2021-12-10 DIAGNOSIS — Z0001 Encounter for general adult medical examination with abnormal findings: Secondary | ICD-10-CM | POA: Diagnosis not present

## 2021-12-10 DIAGNOSIS — E538 Deficiency of other specified B group vitamins: Secondary | ICD-10-CM | POA: Diagnosis not present

## 2021-12-10 DIAGNOSIS — R739 Hyperglycemia, unspecified: Secondary | ICD-10-CM | POA: Diagnosis not present

## 2021-12-10 DIAGNOSIS — Z00121 Encounter for routine child health examination with abnormal findings: Secondary | ICD-10-CM | POA: Insufficient documentation

## 2021-12-10 DIAGNOSIS — I10 Essential (primary) hypertension: Secondary | ICD-10-CM | POA: Diagnosis not present

## 2021-12-10 DIAGNOSIS — E559 Vitamin D deficiency, unspecified: Secondary | ICD-10-CM

## 2021-12-10 DIAGNOSIS — E7800 Pure hypercholesterolemia, unspecified: Secondary | ICD-10-CM

## 2021-12-10 LAB — CBC WITH DIFFERENTIAL/PLATELET
Basophils Absolute: 0 10*3/uL (ref 0.0–0.1)
Basophils Relative: 0.6 % (ref 0.0–3.0)
Eosinophils Absolute: 0.1 10*3/uL (ref 0.0–0.7)
Eosinophils Relative: 2.1 % (ref 0.0–5.0)
HCT: 34.5 % — ABNORMAL LOW (ref 36.0–46.0)
Hemoglobin: 10.8 g/dL — ABNORMAL LOW (ref 12.0–15.0)
Lymphocytes Relative: 30 % (ref 12.0–46.0)
Lymphs Abs: 0.7 10*3/uL (ref 0.7–4.0)
MCHC: 31.3 g/dL (ref 30.0–36.0)
MCV: 82.1 fl (ref 78.0–100.0)
Monocytes Absolute: 0.2 10*3/uL (ref 0.1–1.0)
Monocytes Relative: 9.5 % (ref 3.0–12.0)
Neutro Abs: 1.4 10*3/uL (ref 1.4–7.7)
Neutrophils Relative %: 57.8 % (ref 43.0–77.0)
Platelets: 154 10*3/uL (ref 150.0–400.0)
RBC: 4.21 Mil/uL (ref 3.87–5.11)
RDW: 15.3 % (ref 11.5–15.5)
WBC: 2.5 10*3/uL — ABNORMAL LOW (ref 4.0–10.5)

## 2021-12-10 LAB — LIPID PANEL
Cholesterol: 165 mg/dL (ref 0–200)
HDL: 62.6 mg/dL (ref >39.00)
LDL Cholesterol: 78 mg/dL (ref 0–99)
NonHDL: 102.38
Total CHOL/HDL Ratio: 3
Triglycerides: 120 mg/dL (ref 0.0–149.0)
VLDL: 24 mg/dL (ref 0.0–40.0)

## 2021-12-10 LAB — HEPATIC FUNCTION PANEL
ALT: 9 U/L (ref 0–35)
AST: 14 U/L (ref 0–37)
Albumin: 3.8 g/dL (ref 3.5–5.2)
Alkaline Phosphatase: 57 U/L (ref 39–117)
Bilirubin, Direct: 0.1 mg/dL (ref 0.0–0.3)
Total Bilirubin: 0.4 mg/dL (ref 0.2–1.2)
Total Protein: 6.7 g/dL (ref 6.0–8.3)

## 2021-12-10 LAB — BASIC METABOLIC PANEL
BUN: 13 mg/dL (ref 6–23)
CO2: 26 mEq/L (ref 19–32)
Calcium: 9.4 mg/dL (ref 8.4–10.5)
Chloride: 104 mEq/L (ref 96–112)
Creatinine, Ser: 0.93 mg/dL (ref 0.40–1.20)
GFR: 58.71 mL/min — ABNORMAL LOW (ref >60.00)
Glucose, Bld: 81 mg/dL (ref 70–99)
Potassium: 4.3 mEq/L (ref 3.5–5.1)
Sodium: 140 mEq/L (ref 135–145)

## 2021-12-10 LAB — VITAMIN D 25 HYDROXY (VIT D DEFICIENCY, FRACTURES): VITD: 30.02 ng/mL (ref 30.00–100.00)

## 2021-12-10 LAB — TSH: TSH: 1.64 u[IU]/mL (ref 0.35–5.50)

## 2021-12-10 LAB — HEMOGLOBIN A1C: Hgb A1c MFr Bld: 6 % (ref 4.6–6.5)

## 2021-12-10 LAB — VITAMIN B12: Vitamin B-12: 245 pg/mL (ref 211–911)

## 2021-12-10 NOTE — Progress Notes (Signed)
Patient ID: [name], female   DOB: [birth year], 81 y.o.   MRN: [id number]         Chief Complaint:: wellness exam and Follow-up  Low pulse, copd, diast chf, htn, hld       HPI:  [name] is a 81 y.o. female here for wellness exam; declines covid booster, shingrix, due for flu shot, o/w up to date                        Also Pt denies chest pain, increased sob or doe, wheezing, orthopnea, PND, increased LE swelling, palpitations, dizziness or syncope.   Pt denies polydipsia, polyuria, or new focal neuro s/s.  Pt denies fever, wt loss, night sweats, loss of appetite, or other constitutional symptoms   has had low pulse for several yrs without falls or dizzines.  Trying to follow lower salt, low chol diet.  No other new complaints.      Wt Readings from Last 3 Encounters:  12/10/21 222 lb (100.7 kg)  09/24/21 221 lb (100.2 kg)  06/07/21 211 lb (95.7 kg)   BP Readings from Last 3 Encounters:  12/10/21 128/76  09/24/21 113/62  06/07/21 124/70   Immunization History  Administered Date(s) Administered   Fluad Quad(high Dose 65+) 09/24/2019, 12/10/2021   Influenza Split 09/01/2015   Influenza Whole 09/12/2009, 09/20/2010, 08/07/2011   Influenza, High Dose Seasonal PF 02/02/2014, 09/11/2015, 12/10/2016, 01/11/2017, 09/25/2017, 12/17/2017, 09/01/2018, 01/24/2019, 05/23/2020, 01/07/2021   Influenza,inj,Quad PF,6+ Mos 08/31/2014   Influenza-Unspecified 08/02/2020   PFIZER Comirnaty(Gray Top)Covid-19 Tri-Sucrose Vaccine 01/08/2021   PFIZER(Purple Top)SARS-COV-2 Vaccination 01/27/2020, 02/22/2020, 05/23/2020   Pneumococcal Conjugate-13 11/02/2018   Pneumococcal Polysaccharide-23 07/22/2018, 01/24/2019, 05/23/2020, 01/07/2021   Pneumococcal-Unspecified 12/01/2013   Tdap 11/14/2017   There are no preventive care reminders to display for this patient.     Past Medical History:  Diagnosis Date   Allergic rhinitis    Arnold-Chiari malformation (HCC)    Asthma    CAD (coronary artery  disease)    COPD (chronic obstructive pulmonary disease) (HCC)    Coughing    coughing since last 01-22-2019 , started on cefdinir bid x10 days, has 3 pills left today , reports she feels mcuh better , still coughing copiuus amonts of thick white sputum , denies fever nor chills, nor body aches .    DJD (degenerative joint disease)    GERD (gastroesophageal reflux disease)    History of absence seizures    last confirmed seizure age 89     Hx of colonic polyps    Hypercholesteremia    Hypertension    Obesity    Positive PPD    Reflex sympathetic dystrophy    Varicose veins    Venous insufficiency    Past Surgical History:  Procedure Laterality Date   ABDOMINAL HYSTERECTOMY     cspine surgery  1993   for arnold-chiari malformation   JOINT REPLACEMENT  06/05/11   Left total knee replacement   right knee arthroscopy  12/2007   Dr. Tonita Cong   right total knee replacement  05/2008   Dr. Tonita Cong   TOTAL KNEE REVISION Left 02/03/2019   Procedure: LEFT TOTAL KNEE REVISION;  Surgeon: Rod Can, MD;  Location: [facility];  Service: Orthopedics;  Laterality: Left;    reports that she quit smoking about 9 years ago. Her smoking use included cigarettes. She has a 3.60 pack-year smoking history. She has never used smokeless tobacco. She reports that she does  not drink alcohol and does not use drugs. family history includes Clotting disorder in her father; Heart disease in her mother; Stroke in her father and mother. No Known Allergies Current Outpatient Medications on File Prior to Visit  Medication Sig Dispense Refill   albuterol (PROVENTIL HFA;VENTOLIN HFA) 108 (90 BASE) MCG/ACT inhaler Inhale 2 puffs into the lungs every 4 (four) hours as needed. For wheeze or shortness of breath 1 Inhaler 6   alendronate (FOSAMAX) 70 MG tablet Take 1 tablet (70 mg total) by mouth every 7 (seven) days. Take with a full glass of water on an empty stomach. 12 tablet 3   aspirin 81 MG tablet Take 81 mg by mouth daily.      azelastine (ASTELIN) 0.1 % nasal spray Place 1 spray into both nostrils daily as needed for rhinitis or allergies.      budesonide-formoterol (SYMBICORT) 160-4.5 MCG/ACT inhaler      [employer] D3 50 MCG (2000 UT) CAPS Take 1 capsule by mouth daily.     EPINEPHrine 0.3 mg/0.3 mL IJ SOAJ injection Inject 0.3 mg into the muscle as needed for anaphylaxis.     famotidine (PEPCID) 20 MG tablet Take 1 tablet (20 mg total) by mouth 2 (two) times daily. 180 tablet 3   ferrous sulfate 325 (65 FE) MG EC tablet Take 325 mg by mouth daily.     fluticasone (FLONASE) 50 MCG/ACT nasal spray Place 1 spray into both nostrils 2 (two) times daily as needed for allergies or rhinitis.     furosemide (LASIX) 40 MG tablet Take 1 tablet (40 mg total) by mouth daily. 30 tablet 0   meloxicam (MOBIC) 15 MG tablet TAKE 1 TABLET BY MOUTH EVERY DAY AS NEEDED FOR PAIN 30 tablet 5   montelukast (SINGULAIR) 10 MG tablet Take 1 tablet (10 mg total) by mouth every evening. 90 tablet 3   pantoprazole (PROTONIX) 40 MG tablet Take 1 tablet (40 mg total) by mouth daily. 90 tablet 3   potassium chloride SA (KLOR-CON M20) 20 MEQ tablet Take 1 tablet (20 mEq total) by mouth 2 (two) times daily. 180 tablet 3   rosuvastatin (CRESTOR) 40 MG tablet Take 1 tablet (40 mg total) by mouth daily. 90 tablet 3   Tiotropium Bromide Monohydrate (SPIRIVA RESPIMAT) 1.25 MCG/ACT AERS Inhale 1.25 mcg into the lungs daily.     tiZANidine (ZANAFLEX) 2 MG tablet Take 1 tablet (2 mg total) by mouth every 6 (six) hours as needed for muscle spasms. 40 tablet 1   traMADol (ULTRAM) 50 MG tablet Take 1 tablet (50 mg total) by mouth every 6 (six) hours as needed. 60 tablet 1   TRAMADOL HCL PO tramadol     traZODone (DESYREL) 100 MG tablet Take 1 tablet (100 mg total) by mouth at bedtime. 90 tablet 1   citalopram (CELEXA) 10 MG tablet Take 1 tablet (10 mg total) by mouth daily. (Patient not taking: Reported on 09/24/2021) 90 tablet 3   donepezil (ARICEPT ODT) 5 MG  disintegrating tablet Take 1 tablet (5 mg total) by mouth at bedtime. (Patient not taking: Reported on 09/24/2021) 90 tablet 3   oxyCODONE-acetaminophen (PERCOCET) 10-325 MG tablet Take 1 tablet by mouth 3 (three) times daily as needed for pain. (Patient not taking: Reported on 12/10/2021)     Sodium Chloride-Sodium Bicarb (SINUS [place] SQUEEZE BOTTLE) 2300-700 MG KIT See admin instructions.     No current facility-administered medications on file prior to visit.        ROS:  All others reviewed and negative.  Objective        PE:  BP 128/76 (BP Location: Left Arm, Patient Position: Sitting, Cuff Size: Large)    Pulse (!) 43    Temp 98.1 F (36.7 C) (Oral)    Ht $R'5\' 4"'re$  (1.626 m)    Wt 222 lb (100.7 kg)    SpO2 98%    BMI 38.11 kg/m                 Constitutional: Pt appears in NAD               HENT: Head: NCAT.                Right Ear: External ear normal.                 Left Ear: External ear normal.                Eyes: . Pupils are equal, round, and reactive to light. Conjunctivae and EOM are normal               Nose: without d/c or deformity               Neck: Neck supple. Gross normal ROM               Cardiovascular: Normal rate and regular rhythm.                 Pulmonary/Chest: Effort normal and breath sounds without rales or wheezing.                Abd:  Soft, NT, ND, + BS, no organomegaly               Neurological: Pt is alert. At baseline orientation, motor grossly intact               Skin: Skin is warm. No rashes, no other new lesions, LE edema - none               Psychiatric: Pt behavior is normal without agitation   Micro: none  Cardiac tracings I have personally interpreted today:  none  Pertinent Radiological findings (summarize): none   Lab Results  Component Value Date   WBC 2.5 (L) 12/10/2021   HGB 10.8 (L) 12/10/2021   HCT 34.5 (L) 12/10/2021   PLT 154.0 12/10/2021   GLUCOSE 81 12/10/2021   CHOL 165 12/10/2021   TRIG 120.0 12/10/2021   HDL 62.60  12/10/2021   LDLCALC 78 12/10/2021   ALT 9 12/10/2021   AST 14 12/10/2021   NA 140 12/10/2021   K 4.3 12/10/2021   CL 104 12/10/2021   CREATININE 0.93 12/10/2021   BUN 13 12/10/2021   CO2 26 12/10/2021   TSH 1.64 12/10/2021   INR 1.14 06/16/2011   HGBA1C 6.0 12/10/2021   Assessment/Plan:  TEKESHIA KLAHR is a 81 y.o. Black or African American [2] female with  has a past medical history of Allergic rhinitis, Arnold-Chiari malformation ([place]), Asthma, CAD (coronary artery disease), COPD (chronic obstructive pulmonary disease) (HCC), Coughing, DJD (degenerative joint disease), GERD (gastroesophageal reflux disease), History of absence seizures, colonic polyps, Hypercholesteremia, Hypertension, Obesity, Positive PPD, Reflex sympathetic dystrophy, Varicose veins, and Venous insufficiency.  Encounter for well adult exam with abnormal findings Age and sex appropriate education and counseling updated with regular exercise and diet Referrals for preventative services - none needed Immunizations addressed - declines covid booster ad shingrix, ok  for flu shot today Smoking counseling  - none needed Evidence for depression or other mood disorder - none significant Most recent labs reviewed. I have personally reviewed and have noted: 1) the patient's medical and social history 2) The patient's current medications and supplements 3) The patient's height, weight, and BMI have been recorded in the chart   COPD (chronic obstructive pulmonary disease) (HCC) Stable overall, cont current in haler prn  Essential hypertension BP Readings from Last 3 Encounters:  12/10/21 128/76  09/24/21 113/62  06/07/21 124/70   Stable, pt to continue medical treatment lasix   Hypercholesteremia Lab Results  Component Value Date   LDLCALC 78 12/10/2021   Mild uncontrolled, goal ldl < 70, pt to continue current statin crestor 9, declines add zetia for now   Vitamin D deficiency Last vitamin D Lab Results   Component Value Date   VD25OH 30.02 12/10/2021   Low, to start oral replacement   Bradycardia Chronic stable, continue currrent med tx  Followup: Return in about 6 months (around 06/09/2022).  Cathlean Cower, MD 12/15/2021 7:30 AM [facility]

## 2021-12-10 NOTE — Patient Instructions (Addendum)
You had the flu shot today  Please continue all other medications as before, and refills have been done if requested.  Please have the pharmacy call with any other refills you may need.  Please continue your efforts at being more active, low cholesterol diet, and weight control.  You are otherwise up to date with prevention measures today.  Please keep your appointments with your specialists as you may have planned  .Please go to the LAB at the blood drawing area for the tests to be done  You will be contacted by phone if any changes need to be made immediately.  Otherwise, you will receive a letter about your results with an explanation, but please check with MyChart first.  Please remember to sign up for MyChart if you have not done so, as this will be important to you in the future with finding out test results, communicating by private email, and scheduling acute appointments online when needed.  Please make an Appointment to return in 6 months, or sooner if needed  

## 2021-12-15 ENCOUNTER — Encounter: Payer: Self-pay | Admitting: Internal Medicine

## 2021-12-15 NOTE — Assessment & Plan Note (Signed)
Chronic stable, continue currrent med tx

## 2021-12-15 NOTE — Assessment & Plan Note (Signed)
Last vitamin D Lab Results  Component Value Date   VD25OH 30.02 12/10/2021   Low, to start oral replacement  

## 2021-12-15 NOTE — Assessment & Plan Note (Signed)
BP Readings from Last 3 Encounters:  12/10/21 128/76  09/24/21 113/62  06/07/21 124/70   Stable, pt to continue medical treatment lasix

## 2021-12-15 NOTE — Assessment & Plan Note (Addendum)
Age and sex appropriate education and counseling updated with regular exercise and diet Referrals for preventative services - none needed Immunizations addressed - declines covid booster ad shingrix, ok for flu shot today Smoking counseling  - none needed Evidence for depression or other mood disorder - none significant Most recent labs reviewed. I have personally reviewed and have noted: 1) the patient's medical and social history 2) The patient's current medications and supplements 3) The patient's height, weight, and BMI have been recorded in the chart

## 2021-12-15 NOTE — Assessment & Plan Note (Signed)
Stable overall, cont current in haler prn

## 2021-12-15 NOTE — Assessment & Plan Note (Signed)
Lab Results  Component Value Date   LDLCALC 78 12/10/2021   Mild uncontrolled, goal ldl < 70, pt to continue current statin crestor 40, declines add zetia for now

## 2021-12-20 ENCOUNTER — Ambulatory Visit (INDEPENDENT_AMBULATORY_CARE_PROVIDER_SITE_OTHER): Payer: Medicare PPO | Admitting: Podiatry

## 2021-12-20 ENCOUNTER — Other Ambulatory Visit: Payer: Self-pay

## 2021-12-20 DIAGNOSIS — M79675 Pain in left toe(s): Secondary | ICD-10-CM | POA: Diagnosis not present

## 2021-12-20 DIAGNOSIS — Z89422 Acquired absence of other left toe(s): Secondary | ICD-10-CM | POA: Diagnosis not present

## 2021-12-20 DIAGNOSIS — B351 Tinea unguium: Secondary | ICD-10-CM

## 2021-12-20 DIAGNOSIS — M79674 Pain in right toe(s): Secondary | ICD-10-CM | POA: Diagnosis not present

## 2021-12-27 ENCOUNTER — Encounter: Payer: Self-pay | Admitting: Podiatry

## 2021-12-27 NOTE — Progress Notes (Signed)
°  Subjective:  Patient ID: [name], female    DOB: 07/31/1941,  MRN: [id number]  [name] presents to clinic today for at risk foot care. Patient has h/o amputation of digital amputation L 2nd toe and painful elongated mycotic toenails 1-5 bilaterally which are tender when wearing enclosed shoe gear. Pain is relieved with periodic professional debridement.  PCP is Biagio Borg, MD , and last visit was 12/10/2021.  No Known Allergies  Review of Systems: Negative except as noted in the HPI. Objective:   Constitutional [name] is a pleasant 81 y.o. African American female, in NAD. AAO x 3.   Vascular CFT <3 seconds b/l LE. Palpable DP/PT pulses b/l LE. Digital hair absent b/l. Skin temperature gradient WNL b/l. No pain with calf compression b/l. No edema noted b/l. No cyanosis or clubbing noted b/l LE.  Neurologic Normal speech. Oriented to person, place, and time. Protective sensation intact 5/5 intact bilaterally with 10g monofilament b/l.  Dermatologic Pedal integument with normal turgor, texture and tone BLE. No open wounds b/l LE. No interdigital macerations noted b/l LE. Toenails bilateral great toes, bilateral 3rd toes, bilateral 4th toes, bilateral 5th toes, and R 2nd toe elongated, discolored, dystrophic, thickened, and crumbly with subungual debris and tenderness to dorsal palpation. No hyperkeratotic nor porokeratotic lesions present on today's visit.  Orthopedic: Muscle strength 5/5 to all lower extremity muscle groups bilaterally. Lower extremity amputation(s): digital amputation L 2nd toe.   Radiographs: None  Last A1c:  Hemoglobin A1C Latest Ref Rng & Units 12/10/2021  HGBA1C 4.6 - 6.5 % 6.0  Some recent data might be hidden   Assessment:   1. Pain due to onychomycosis of toenails of both feet   2. 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 Consent given for treatment as  described below: -No new findings. No new orders. -Mycotic toenails bilateral great toes, 3-5 bilaterally, and R 2nd toe were debrided in length and girth with sterile nail nippers and dremel without iatrogenic bleeding. -Patient/POA to call should there be question/concern in the interim.  Return in about 3 months (around 03/20/2022).  Marzetta Board, DPM

## 2022-02-14 ENCOUNTER — Other Ambulatory Visit: Payer: Self-pay | Admitting: Internal Medicine

## 2022-03-13 DIAGNOSIS — J3089 Other allergic rhinitis: Secondary | ICD-10-CM | POA: Diagnosis not present

## 2022-03-20 DIAGNOSIS — E785 Hyperlipidemia, unspecified: Secondary | ICD-10-CM | POA: Diagnosis not present

## 2022-03-20 DIAGNOSIS — G3184 Mild cognitive impairment, so stated: Secondary | ICD-10-CM | POA: Diagnosis not present

## 2022-03-20 DIAGNOSIS — K219 Gastro-esophageal reflux disease without esophagitis: Secondary | ICD-10-CM | POA: Diagnosis not present

## 2022-03-20 DIAGNOSIS — G8929 Other chronic pain: Secondary | ICD-10-CM | POA: Diagnosis not present

## 2022-03-20 DIAGNOSIS — Z89422 Acquired absence of other left toe(s): Secondary | ICD-10-CM | POA: Diagnosis not present

## 2022-03-20 DIAGNOSIS — Z6841 Body Mass Index (BMI) 40.0 and over, adult: Secondary | ICD-10-CM | POA: Diagnosis not present

## 2022-03-20 DIAGNOSIS — J449 Chronic obstructive pulmonary disease, unspecified: Secondary | ICD-10-CM | POA: Diagnosis not present

## 2022-03-20 DIAGNOSIS — R030 Elevated blood-pressure reading, without diagnosis of hypertension: Secondary | ICD-10-CM | POA: Diagnosis not present

## 2022-03-24 ENCOUNTER — Ambulatory Visit (INDEPENDENT_AMBULATORY_CARE_PROVIDER_SITE_OTHER): Payer: Medicare PPO | Admitting: Podiatry

## 2022-03-24 ENCOUNTER — Encounter: Payer: Self-pay | Admitting: Podiatry

## 2022-03-24 DIAGNOSIS — B351 Tinea unguium: Secondary | ICD-10-CM

## 2022-03-24 DIAGNOSIS — M79675 Pain in left toe(s): Secondary | ICD-10-CM

## 2022-03-24 DIAGNOSIS — M79672 Pain in left foot: Secondary | ICD-10-CM | POA: Diagnosis not present

## 2022-03-24 DIAGNOSIS — M79674 Pain in right toe(s): Secondary | ICD-10-CM

## 2022-03-24 DIAGNOSIS — Z89422 Acquired absence of other left toe(s): Secondary | ICD-10-CM | POA: Diagnosis not present

## 2022-03-24 NOTE — Progress Notes (Signed)
?  Subjective:  ?Patient ID: [name], female    DOB: [birth year],  MRN: [id number] ? ?ARYANNE GILLELAND presents to clinic today for at risk foot care. Patient has h/o amputation of digital amputation L 2nd toe and thick, elongated toenails remaining digits of both feet which are tender when wearing enclosed shoe gear. ? ?New problem(s): She relates some discomfort to plantar left heel which is the extremity where she wears her AFO. ? ?PCP is Biagio Borg, MD , and last visit was December 10, 2021. ? ?No Known Allergies ? ?Review of Systems: Negative except as noted in the HPI. ? ?Objective: No changes noted in today's physical examination. ?Constitutional [name] is a pleasant 81 y.o. African American female, in NAD. AAO x 3.   ?Vascular CFT <3 seconds b/l LE. Palpable DP/PT pulses b/l LE. Digital hair absent b/l. Skin temperature gradient WNL b/l. No pain with calf compression b/l. No edema noted b/l. No cyanosis or clubbing noted b/l LE.  ?Neurologic Normal speech. Oriented to person, place, and time. Protective sensation intact 5/5 intact bilaterally with 10g monofilament b/l.  ?Dermatologic Pedal integument with normal turgor, texture and tone BLE. No open wounds b/l LE. No interdigital macerations noted b/l LE. Toenails left great toe, right 2nd and 3rd toe, bilateral 4th toes, bilateral 5th toes, elongated, discolored, dystrophic, thickened, and crumbly with subungual debris and tenderness to dorsal palpation. Anonychia right great toe, left 3rd toe. No hyperkeratotic nor porokeratotic lesions present on today's visit. She does have indention on plantar aspect of left heel which corresponds to contact with metal plate of AFO  ?Orthopedic: Muscle strength 5/5 to all lower extremity muscle groups bilaterally. Lower extremity amputation(s): digital amputation L 2nd toe.  ? ?Radiographs: None ? ?  Latest Ref Rng & Units 12/10/2021  ? 10:49 AM  ?Hemoglobin A1C  ?Hemoglobin-A1c 4.6 - 6.5 % 6.0     ? ?Assessment/Plan: ?1. Pain due to onychomycosis of toenails of both feet   ?2. Pain of left heel   ?3. Status post amputation of lesser toe of left foot ([place])   ?  ?-Examined patient. ?-Placed felt heel lift for protection of left heel and to prevent contact with metal plate. If it begins to feel uncomfortable, she may remove it at any time; afterwhich I recommend her placing a gel heel pad in the left shoe. She related understanding. ?-Patient to continue soft, supportive shoe gear daily. ?-Toenails 2-5 right foot, L hallux, L 4th toe, and L 5th toe debrided in length and girth without iatrogenic bleeding with sterile nail nipper and dremel.  ?-Patient/POA to call should there be question/concern in the interim.  ? ?Return in about 3 months (around 06/23/2022). ? ?Marzetta Board, DPM  ?

## 2022-03-28 ENCOUNTER — Other Ambulatory Visit: Payer: Self-pay | Admitting: Internal Medicine

## 2022-03-29 NOTE — Telephone Encounter (Signed)
Please refill as per office routine med refill policy (all routine meds to be refilled for 3 mo or monthly (per pt preference) up to one year from last visit, then month to month grace period for 3 mo, then further med refills will have to be denied) ? ?

## 2022-04-02 DIAGNOSIS — M19072 Primary osteoarthritis, left ankle and foot: Secondary | ICD-10-CM | POA: Diagnosis not present

## 2022-04-03 ENCOUNTER — Other Ambulatory Visit: Payer: Self-pay | Admitting: Internal Medicine

## 2022-04-03 NOTE — Telephone Encounter (Signed)
Please refill as per office routine med refill policy (all routine meds to be refilled for 3 mo or monthly (per pt preference) up to one year from last visit, then month to month grace period for 3 mo, then further med refills will have to be denied) ? ?

## 2022-04-11 DIAGNOSIS — J3089 Other allergic rhinitis: Secondary | ICD-10-CM | POA: Diagnosis not present

## 2022-04-29 ENCOUNTER — Emergency Department (HOSPITAL_COMMUNITY): Payer: Medicare PPO

## 2022-04-29 ENCOUNTER — Other Ambulatory Visit: Payer: Self-pay

## 2022-04-29 ENCOUNTER — Ambulatory Visit (HOSPITAL_COMMUNITY)
Admission: EM | Admit: 2022-04-29 | Discharge: 2022-04-29 | Disposition: A | Discharge status: Home / Self Care | Payer: Medicare PPO

## 2022-04-29 ENCOUNTER — Encounter (HOSPITAL_COMMUNITY): Payer: Self-pay | Admitting: Emergency Medicine

## 2022-04-29 ENCOUNTER — Emergency Department (HOSPITAL_COMMUNITY)
Admission: EM | Admit: 2022-04-29 | Discharge: 2022-04-30 | Disposition: A | Discharge status: Home / Self Care | Payer: Medicare PPO | Attending: Emergency Medicine | Admitting: Emergency Medicine

## 2022-04-29 DIAGNOSIS — J441 Chronic obstructive pulmonary disease with (acute) exacerbation: Secondary | ICD-10-CM | POA: Diagnosis not present

## 2022-04-29 DIAGNOSIS — S0990XA Unspecified injury of head, initial encounter: Secondary | ICD-10-CM

## 2022-04-29 DIAGNOSIS — S61210A Laceration without foreign body of right index finger without damage to nail, initial encounter: Secondary | ICD-10-CM | POA: Diagnosis not present

## 2022-04-29 DIAGNOSIS — S161XXA Strain of muscle, fascia and tendon at neck level, initial encounter: Secondary | ICD-10-CM | POA: Diagnosis not present

## 2022-04-29 DIAGNOSIS — M7989 Other specified soft tissue disorders: Secondary | ICD-10-CM | POA: Diagnosis not present

## 2022-04-29 DIAGNOSIS — Y92511 Restaurant or cafe as the place of occurrence of the external cause: Secondary | ICD-10-CM | POA: Insufficient documentation

## 2022-04-29 DIAGNOSIS — S61320A Laceration with foreign body of right index finger with damage to nail, initial encounter: Secondary | ICD-10-CM

## 2022-04-29 DIAGNOSIS — Y9301 Activity, walking, marching and hiking: Secondary | ICD-10-CM | POA: Diagnosis not present

## 2022-04-29 DIAGNOSIS — W19XXXA Unspecified fall, initial encounter: Secondary | ICD-10-CM | POA: Insufficient documentation

## 2022-04-29 DIAGNOSIS — M25551 Pain in right hip: Secondary | ICD-10-CM | POA: Diagnosis not present

## 2022-04-29 DIAGNOSIS — S199XXA Unspecified injury of neck, initial encounter: Secondary | ICD-10-CM | POA: Diagnosis not present

## 2022-04-29 DIAGNOSIS — I2510 Atherosclerotic heart disease of native coronary artery without angina pectoris: Secondary | ICD-10-CM | POA: Diagnosis not present

## 2022-04-29 DIAGNOSIS — I6381 Other cerebral infarction due to occlusion or stenosis of small artery: Secondary | ICD-10-CM | POA: Diagnosis not present

## 2022-04-29 DIAGNOSIS — J45909 Unspecified asthma, uncomplicated: Secondary | ICD-10-CM | POA: Insufficient documentation

## 2022-04-29 NOTE — ED Provider Triage Note (Cosign Needed Addendum)
Emergency Medicine Provider Triage Evaluation Note  [name] , a 81 y.o. female  was evaluated in triage.  Pt complains of multiple injuries following a fall.  Was stepping up onto a curb when she lost her balance fell backwards hit her head.  Also lacerated her finger.  Was seen at urgent care, they put a Band-Aid on her finger.  Was urged to come to the emergency room for further evaluation/imaging.  Denies loss of consciousness, dizziness, lightheadedness preceding or after the fall.  Denies vision changes, nausea, vomiting, or bleeding from the head.  Endorses tenderness to the upper back side of her head.  Review of Systems  Positive:  Negative: As above  Physical Exam  BP 127/70 (BP Location: Left Arm)   Pulse (!) 59   Temp 99 F (37.2 C) (Oral)   Resp 18   SpO2 92%  Gen:   Awake, no distress, presenting in c-collar Resp:  Normal effort, CTAB MSK:   Moves extremities without difficulty with the exception of the right index finger.  Bandage was removed, deep laceration appreciated and assessed.  Sensation and blood flow appears intact of the distal finger.  No active bleeding.  Rewrapped appropriately. Other:  Mild warmth and hematoma appreciated on the upper occipital region of the skull, without appreciated wounds or bleeding.  PERRLA.  Normal EOMs.  Overall neuro exam unremarkable.  Medical Decision Making  Medically screening exam initiated at 9:28 PM.  Appropriate orders placed.  INOCENCIA MURTAUGH was informed that the remainder of the evaluation will be completed by another provider, this initial triage assessment does not replace that evaluation, and the importance of remaining in the ED until their evaluation is complete.  Imaging ordered     Candace Cruise 93/23/55 2143  Not on anticoagulation.    Prince Rome, PA-C 73/22/02 2144

## 2022-04-29 NOTE — ED Triage Notes (Signed)
Pt sent here by urgent care for fall. Pt was in a parking lot and was stepping up onto a curb and fell backward, hit posterior head on pavement. Pt denies LOC, no thinners. Pt reports posterior head, neck pain and dizziness.

## 2022-04-29 NOTE — ED Triage Notes (Signed)
Pt reports fell in parking lot when stepping up on high curb. Reports fell backwards and hit head on concrete. Denies LOC but having dizziness.  Pt adds to [place] PA at bedside that having blurred vision and headache.

## 2022-04-30 MED ORDER — LIDOCAINE HCL (PF) 1 % IJ SOLN
10.0000 mL | Freq: Once | INTRAMUSCULAR | Status: DC
  Filled 2022-04-30: qty 10

## 2022-04-30 MED ORDER — CEPHALEXIN 500 MG PO CAPS: 500.0000 mg | ORAL_CAPSULE | Freq: Two times a day (BID) | ORAL | 0 refills | Status: AC

## 2022-04-30 NOTE — Discharge Instructions (Signed)
You were seen in the emergency room today after a fall.  Your finger laceration was repaired with 6 sutures.  These will need to be removed in 7 to 10 days.  Because there was some dirt in the wound initially I am starting on antibiotics for the next 5 days.  Please return with any new or suddenly worsening symptoms.

## 2022-04-30 NOTE — ED Provider Notes (Signed)
Emergency Department Provider Note   I have reviewed the triage vital signs and the nursing notes.   HISTORY  Chief Complaint Fall   HPI [name] is a 81 y.o. female with past medical history reviewed below presents emergency department for evaluation after fall while walking into a restaurant this evening.  Patient tells me the she was stepping up onto the curb from the pavement when she missed her footing and fell backward.  She hit the posterior head on the ground.  There was no loss of consciousness.  She is having some mild neck discomfort.  She notes a laceration to the right index finger.  She felt somewhat lightheaded after the fall but that has resolved.  No chest pain, palpitations, shortness of breath prior to falling. She initially went to [facility] and was re-directed to the ED for evaluation.    Past Medical History:  Diagnosis Date   Allergic rhinitis    Arnold-Chiari malformation (HCC)    Asthma    CAD (coronary artery disease)    COPD (chronic obstructive pulmonary disease) (HCC)    Coughing    coughing since last 01-22-2019 , started on cefdinir bid x10 days, has 3 pills left today , reports she feels mcuh better , still coughing copiuus amonts of thick white sputum , denies fever nor chills, nor body aches .    DJD (degenerative joint disease)    GERD (gastroesophageal reflux disease)    History of absence seizures    last confirmed seizure age 25     Hx of colonic polyps    Hypercholesteremia    Hypertension    Obesity    Positive PPD    Reflex sympathetic dystrophy    Varicose veins    Venous insufficiency     Review of Systems  Constitutional: No fever/chills Eyes: No visual changes. ENT: No sore throat. Cardiovascular: Denies chest pain. Respiratory: Denies shortness of breath. Gastrointestinal: No abdominal pain.  No nausea, no vomiting.  No diarrhea.  No constipation. Genitourinary: Negative for dysuria. Musculoskeletal: Positive right index  finger pain.  Skin: Negative for rash. Neurological: Negative for focal weakness or numbness. Positive posterior HA.    ____________________________________________   PHYSICAL EXAM:  VITAL SIGNS: ED Triage Vitals  Enc Vitals Group     BP 04/29/22 1956 127/70     Pulse Rate 04/29/22 1956 (!) 59     Resp 04/29/22 1956 18     Temp 04/29/22 1956 99 F (37.2 C)     Temp Source 04/29/22 1956 Oral     SpO2 04/29/22 1956 92 %   Constitutional: Alert and oriented. Well appearing and in no acute distress. Eyes: Conjunctivae are normal.  Head: Atraumatic. Nose: No congestion/rhinnorhea. Mouth/Throat: Mucous membranes are moist.   Neck: No stridor. No cervical spine tenderness to palpation. Cardiovascular: Normal rate, regular rhythm. Good peripheral circulation. Grossly normal heart sounds.   Respiratory: Normal respiratory effort.  No retractions. Lungs CTAB. Gastrointestinal: Soft and nontender. No distention.  Musculoskeletal: No lower extremity tenderness nor edema. No gross deformities of extremities.  Laceration over the dorsal aspect of the right index finger overlying the PIP joint and extending laterally. Normal flexor-extensor function. No joint space violation. Entire depth of wound visualized.  Neurologic:  Normal speech and language. No gross focal neurologic deficits are appreciated.  Skin:  Skin is warm and dry. 4 cm laceration to the right index finger.   ____________________________________________  RADIOLOGY  CT HEAD WO CONTRAST (5MM)  Result Date: 04/29/2022 CLINICAL DATA:  Fall injury, head and neck trauma. EXAM: CT HEAD WITHOUT CONTRAST CT CERVICAL SPINE WITHOUT CONTRAST TECHNIQUE: Multidetector CT imaging of the head and cervical spine was performed following the standard protocol without intravenous contrast. Multiplanar CT image reconstructions of the cervical spine were also generated. RADIATION DOSE REDUCTION: This exam was performed according to the  departmental dose-optimization program which includes automated exposure control, adjustment of the mA and/or kV according to patient size and/or use of iterative reconstruction technique. COMPARISON:  CT scan head and cervical spine both 04/23/2021. FINDINGS: CT HEAD FINDINGS Brain: There is mild atrophy, small vessel disease and atrophic ventriculomegaly of the cerebral hemispheres with unremarkable cerebellum and brainstem. There are bilateral small chronic lacunar infarcts in the heads of the caudate nuclei. No asymmetry is seen concerning for acute infarct, hemorrhage or mass. Basal cisterns are clear. Vascular:There are calcifications of the carotid siphons but no hyperdense central vessels. Skull: The calvarium, skull base and orbits are intact without focal skull lesions. There is no visible scalp hematoma. Sinuses/Orbits: No acute finding.  Mild S shaped nasal septum. Other: None. CT CERVICAL SPINE FINDINGS Alignment: There is an increased now 5 mm grade 1 C2-3 anterolisthesis associated with complete worsening disc collapse at this level with reactive endplate sclerosis and subcortical cystic changes. There are no further new alignment abnormalities. Narrowing and spurring of the anterior atlantodental joint appears similar as well as reversed lordosis centered at C4-C5. Skull base and vertebrae: Osteopenia is noted without evidence of compression fractures or displaced fractures. 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 No focal or destructive bone lesion is seen. Soft tissues and spinal canal: No prevertebral fluid or swelling. No visible canal hematoma. There are calcifications in both proximal cervical ICAs. Disc levels: The discs are diffusely degenerated and collapsed. There are bidirectional osteophytes. At C2-3 there is increasing narrowing of the spinal canal to 6 mm due to the increased anterolisthesis with mild compressive effect on the spinal cord. There is  similar ventral encroachment on the thecal sac and cord surface due to disc osteophyte complexes at C3-4, C4-5 and C5-6. C6-7 again demonstrates a disc osteophyte complex with more significant spinal canal stenosis and spondylotic cord compression with thecal sac AP narrowing to 5 mm. Multilevel facet joint and uncinate hypertrophy are also again noted with multilevel moderate to severe foraminal stenosis, increasingly so at C2-3 and otherwise unchanged. Upper chest: Negative. Other: None. IMPRESSION: 1. No acute intracranial CT findings or depressed skull fractures. 2. Atrophy and small-vessel disease. Chronic lacunae in the caudate heads. 3. Osteopenia and degenerative change without evidence of cervical fractures. 4. Increased grade 1 C2-3 spondylolisthesis, disc collapse and reactive endplate sclerosis. 5. Resulting increased spinal canal stenosis at C2-3 and mild compressive effect on the cord. 6. 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 Electronically Signed   By: Telford Nab M.D.   On: 04/29/2022 21:16   CT Cervical Spine Wo Contrast  Result Date: 04/29/2022 CLINICAL DATA:  Fall injury, head and neck trauma. EXAM: CT HEAD WITHOUT CONTRAST CT CERVICAL SPINE WITHOUT CONTRAST TECHNIQUE: Multidetector CT imaging of the head and cervical spine was performed following the standard protocol without intravenous contrast. Multiplanar CT image reconstructions of the cervical spine were also generated. RADIATION DOSE REDUCTION: This exam was performed according to the departmental dose-optimization program which includes automated exposure control, adjustment of the mA and/or  kV according to patient size and/or use of iterative reconstruction technique. COMPARISON:  CT scan head and cervical spine both 04/23/2021. FINDINGS: CT HEAD FINDINGS Brain: There is mild atrophy, small vessel disease and atrophic ventriculomegaly of the  cerebral hemispheres with unremarkable cerebellum and brainstem. There are bilateral small chronic lacunar infarcts in the heads of the caudate nuclei. No asymmetry is seen concerning for acute infarct, hemorrhage or mass. Basal cisterns are clear. Vascular:There are calcifications of the carotid siphons but no hyperdense central vessels. Skull: The calvarium, skull base and orbits are intact without focal skull lesions. There is no visible scalp hematoma. Sinuses/Orbits: No acute finding.  Mild S shaped nasal septum. Other: None. CT CERVICAL SPINE FINDINGS Alignment: There is an increased now 5 mm grade 1 C2-3 anterolisthesis associated with complete worsening disc collapse at this level with reactive endplate sclerosis and subcortical cystic changes. There are no further new alignment abnormalities. Narrowing and spurring of the anterior atlantodental joint appears similar as well as reversed lordosis centered at C4-C5. Skull base and vertebrae: Osteopenia is noted without evidence of compression fractures or displaced fractures. 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 No focal or destructive bone lesion is seen. Soft tissues and spinal canal: No prevertebral fluid or swelling. No visible canal hematoma. There are calcifications in both proximal cervical ICAs. Disc levels: The discs are diffusely degenerated and collapsed. There are bidirectional osteophytes. At C2-3 there is increasing narrowing of the spinal canal to 6 mm due to the increased anterolisthesis with mild compressive effect on the spinal cord. There is similar ventral encroachment on the thecal sac and cord surface due to disc osteophyte complexes at C3-4, C4-5 and C5-6. C6-7 again demonstrates a disc osteophyte complex with more significant spinal canal stenosis and spondylotic cord compression with thecal sac AP narrowing to 5 mm. Multilevel facet joint and uncinate hypertrophy are also again noted with  multilevel moderate to severe foraminal stenosis, increasingly so at C2-3 and otherwise unchanged. Upper chest: Negative. Other: None. IMPRESSION: 1. No acute intracranial CT findings or depressed skull fractures. 2. Atrophy and small-vessel disease. Chronic lacunae in the caudate heads. 3. Osteopenia and degenerative change without evidence of cervical fractures. 4. Increased grade 1 C2-3 spondylolisthesis, disc collapse and reactive endplate sclerosis. 5. Resulting increased spinal canal stenosis at C2-3 and mild compressive effect on the cord. 6. 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 Electronically Signed   By: Telford Nab M.D.   On: 04/29/2022 21:16   DG Finger Index Right  Result Date: 04/29/2022 CLINICAL DATA:  Fall, laceration. EXAM: RIGHT INDEX FINGER 2+V COMPARISON:  None Available. FINDINGS: There is soft tissue swelling and air compatible with laceration posterior to the second proximal phalanx. There are few punctate radiopaque densities in the soft tissues posterior to the second proximal interphalangeal joint. There is no evidence for fracture or dislocation. Joint spaces are maintained. There are mild degenerative changes of the first metatarsophalangeal joint and first interphalangeal joint. IMPRESSION: 1. Soft tissue swelling and laceration of the posterior second finger with punctate densities worrisome for foreign bodies posterior to the second proximal interphalangeal joint. 2. No acute fracture or dislocation. Electronically Signed   By: Ronney Asters M.D.   On: 04/29/2022 21:58   DG Hip Unilat  With Pelvis 2-3 Views Right  Result Date: 04/29/2022 CLINICAL DATA:  Fall, right hip pain. EXAM: DG HIP (WITH OR WITHOUT  PELVIS) 2-3V RIGHT COMPARISON:  None Available. FINDINGS: AP projection mildly internally rotated, query difficulty externally rotating hip. Mild axial loss of articular space in both  hips. No visible bony discontinuity to indicate right hip fracture. There is sclerosis compatible with chronic avascular necrosis in the contralateral (left) femoral head, without flattening. IMPRESSION: 1. No fracture is identified. The frontal projection has the hip mildly internally rotated, the patient may have some limitation inability to externally rotate the hip, which may mild mildly reduced diagnostic sensitivity. If the patient is unable to bear weight, consider cross-sectional imaging. 2. 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 3. Mild degenerative axial loss of articular space in both hips. Electronically Signed   By: Van Clines M.D.   On: 04/29/2022 20:53    ____________________________________________   PROCEDURES  Procedure(s) performed:   Marland KitchenMarland KitchenLaceration Repair  Date/Time: 04/30/2022 1:38 AM Performed by: Margette Fast, MD Authorized by: Margette Fast, MD   Consent:    Consent obtained:  Verbal   Consent given by:  Patient   Risks, benefits, and alternatives were discussed: yes     Risks discussed:  Infection, need for additional repair, nerve damage, poor cosmetic result, poor wound healing, pain, retained foreign body, tendon damage and vascular damage Universal protocol:    Patient identity confirmed:  Verbally with patient Anesthesia:    Anesthesia method:  Local infiltration   Local anesthetic:  Lidocaine 1% w/o epi Laceration details:    Location:  Finger   Finger location:  R index finger   Length (cm):  4   Depth (mm):  4 Pre-procedure details:    Preparation:  Patient was prepped and draped in usual sterile fashion and imaging obtained to evaluate for foreign bodies Exploration:    Hemostasis achieved with:  Direct pressure   Imaging obtained: x-ray     Imaging outcome: foreign body noted     Wound exploration: wound explored through full range of motion and entire depth of wound visualized     Wound extent: foreign bodies/material      Wound extent: no nerve damage noted, no tendon damage noted, no underlying fracture noted and no vascular damage noted     Foreign bodies/material:  Removed with irrigation (1L)   Contaminated: no   Treatment:    Area cleansed with:  Povidone-iodine and saline   Amount of cleaning:  Extensive   Irrigation solution:  Sterile saline   Irrigation volume:  1000 ml   Irrigation method:  Pressure wash   Visualized foreign bodies/material removed: yes     Debridement:  None Skin repair:    Repair method:  Sutures   Suture size:  3-0   Suture material:  Prolene   Suture technique:  Simple interrupted   Number of sutures:  6 Approximation:    Approximation:  Close Repair type:    Repair type:  Intermediate Post-procedure details:    Dressing:  Bulky dressing and splint for protection   Procedure completion:  Tolerated well, no immediate complications   ____________________________________________   INITIAL IMPRESSION / ASSESSMENT AND PLAN / ED COURSE  Pertinent labs & imaging results that were available during my care of the patient were reviewed by me and considered in my medical decision making (see chart for details).   This patient is Presenting for Evaluation of fall, which does require a range of treatment options, and is a complaint that involves a high risk of morbidity and mortality.  The Differential Diagnoses includes subdural  hematoma, epidural hematoma, acute concussion, traumatic subarachnoid hemorrhage, cerebral contusions, etc.   Critical Interventions-    Medications  lidocaine (PF) (XYLOCAINE) 1 % injection 10 mL (has no administration in time range)    Reassessment after intervention: Pain improved. Finger wound sutured and hemostatic.    I did obtain Additional Historical Information from family at bedside.    Clinical Laboratory Tests: Considered need for lab work-up but patient gives a good description of the fall that was mechanical without prodrome.  Defer labs for now.   Radiologic Tests Ordered, included CT head/c-spine along with finger and hip x-ray. I independently interpreted the images and agree with radiology interpretation.   Cardiac Monitor Tracing which shows NSR.   Social Determinants of Health Risk patient is a former smoker.   Medical Decision Making: Summary:  Patient presents to the emergency department for evaluation after mechanical fall.  She has laceration to the right index finger which I washed out to remove some grit which was left behind.  I do not visualize any additional foreign bodies in the wound.  The wound was sutured as above without complication.  We will place the patient in a finger splint for protection.  She has seen Dr. Amedeo Plenty in the past and will schedule follow up.   CT imaging is reassuring.  No pain in the hip or difficulty with range of motion.  Do not plan for cross-sectional imaging.   Reevaluation with update and discussion with patient. Splint in place. Plan for suture removal in 7 days.   Disposition: discharge  ____________________________________________  FINAL CLINICAL IMPRESSION(S) / ED DIAGNOSES  Final diagnoses:  Injury of head, initial encounter  Fall, initial encounter  Strain of neck muscle, initial encounter  Laceration of right index finger with foreign body and damage to nail, initial encounter     NEW OUTPATIENT MEDICATIONS STARTED DURING THIS VISIT:  Discharge Medication List as of 04/30/2022  2:16 AM     START taking these medications   Details  cephALEXin (KEFLEX) 500 MG capsule Take 1 capsule (500 mg total) by mouth 2 (two) times daily for 5 days., Starting Wed 04/30/2022, Until Mon 05/05/2022, Normal        Note:  This document was prepared using Dragon voice recognition software and may include unintentional dictation errors.  Nanda Quinton, MD, [facility] Emergency Medicine    Bellatrix Devonshire, [facility], MD 04/30/22 [phone]

## 2022-05-01 DIAGNOSIS — M79644 Pain in right finger(s): Secondary | ICD-10-CM | POA: Diagnosis not present

## 2022-05-08 DIAGNOSIS — S61210A Laceration without foreign body of right index finger without damage to nail, initial encounter: Secondary | ICD-10-CM | POA: Insufficient documentation

## 2022-05-13 DIAGNOSIS — J3081 Allergic rhinitis due to animal (cat) (dog) hair and dander: Secondary | ICD-10-CM | POA: Diagnosis not present

## 2022-05-13 DIAGNOSIS — R052 Subacute cough: Secondary | ICD-10-CM | POA: Diagnosis not present

## 2022-05-13 DIAGNOSIS — J4540 Moderate persistent asthma, uncomplicated: Secondary | ICD-10-CM | POA: Diagnosis not present

## 2022-05-13 DIAGNOSIS — J301 Allergic rhinitis due to pollen: Secondary | ICD-10-CM | POA: Diagnosis not present

## 2022-05-13 DIAGNOSIS — J3089 Other allergic rhinitis: Secondary | ICD-10-CM | POA: Diagnosis not present

## 2022-05-13 DIAGNOSIS — K219 Gastro-esophageal reflux disease without esophagitis: Secondary | ICD-10-CM | POA: Diagnosis not present

## 2022-05-20 ENCOUNTER — Other Ambulatory Visit: Payer: Self-pay | Admitting: Internal Medicine

## 2022-06-10 ENCOUNTER — Ambulatory Visit: Payer: BC Managed Care – PPO | Admitting: Internal Medicine

## 2022-06-14 ENCOUNTER — Other Ambulatory Visit: Payer: Self-pay | Admitting: Internal Medicine

## 2022-06-18 ENCOUNTER — Encounter: Payer: Self-pay | Admitting: Internal Medicine

## 2022-06-18 ENCOUNTER — Ambulatory Visit (INDEPENDENT_AMBULATORY_CARE_PROVIDER_SITE_OTHER): Payer: Medicare PPO | Admitting: Internal Medicine

## 2022-06-18 VITALS — BP 122/64 | HR 45 | Temp 99.0°F | Ht 64.0 in | Wt 217.0 lb

## 2022-06-18 DIAGNOSIS — I10 Essential (primary) hypertension: Secondary | ICD-10-CM

## 2022-06-18 DIAGNOSIS — R739 Hyperglycemia, unspecified: Secondary | ICD-10-CM | POA: Diagnosis not present

## 2022-06-18 DIAGNOSIS — E7800 Pure hypercholesterolemia, unspecified: Secondary | ICD-10-CM

## 2022-06-18 DIAGNOSIS — R197 Diarrhea, unspecified: Secondary | ICD-10-CM

## 2022-06-18 DIAGNOSIS — L03115 Cellulitis of right lower limb: Secondary | ICD-10-CM | POA: Diagnosis not present

## 2022-06-18 DIAGNOSIS — E559 Vitamin D deficiency, unspecified: Secondary | ICD-10-CM | POA: Diagnosis not present

## 2022-06-18 LAB — CBC WITH DIFFERENTIAL/PLATELET
Basophils Absolute: 0 10*3/uL (ref 0.0–0.1)
Basophils Relative: 1.2 % (ref 0.0–3.0)
Eosinophils Absolute: 0 10*3/uL (ref 0.0–0.7)
Eosinophils Relative: 1.7 % (ref 0.0–5.0)
HCT: 35.5 % — ABNORMAL LOW (ref 36.0–46.0)
Hemoglobin: 11.3 g/dL — ABNORMAL LOW (ref 12.0–15.0)
Lymphocytes Relative: 32.3 % (ref 12.0–46.0)
Lymphs Abs: 0.8 10*3/uL (ref 0.7–4.0)
MCHC: 31.9 g/dL (ref 30.0–36.0)
MCV: 83.5 fl (ref 78.0–100.0)
Monocytes Absolute: 0.3 10*3/uL (ref 0.1–1.0)
Monocytes Relative: 10.5 % (ref 3.0–12.0)
Neutro Abs: 1.4 10*3/uL (ref 1.4–7.7)
Neutrophils Relative %: 54.3 % (ref 43.0–77.0)
Platelets: 147 10*3/uL — ABNORMAL LOW (ref 150.0–400.0)
RBC: 4.25 Mil/uL (ref 3.87–5.11)
RDW: 14.5 % (ref 11.5–15.5)
WBC: 2.5 10*3/uL — ABNORMAL LOW (ref 4.0–10.5)

## 2022-06-18 LAB — BASIC METABOLIC PANEL
BUN: 12 mg/dL (ref 6–23)
CO2: 30 mEq/L (ref 19–32)
Calcium: 9.1 mg/dL (ref 8.4–10.5)
Chloride: 103 mEq/L (ref 96–112)
Creatinine, Ser: 0.84 mg/dL (ref 0.40–1.20)
GFR: 66.1 mL/min (ref >60.00)
Glucose, Bld: 85 mg/dL (ref 70–99)
Potassium: 4.5 mEq/L (ref 3.5–5.1)
Sodium: 140 mEq/L (ref 135–145)

## 2022-06-18 LAB — LIPID PANEL
Cholesterol: 151 mg/dL (ref 0–200)
HDL: 58.3 mg/dL (ref >39.00)
LDL Cholesterol: 78 mg/dL (ref 0–99)
NonHDL: 92.31
Total CHOL/HDL Ratio: 3
Triglycerides: 74 mg/dL (ref 0.0–149.0)
VLDL: 14.8 mg/dL (ref 0.0–40.0)

## 2022-06-18 LAB — HEMOGLOBIN A1C: Hgb A1c MFr Bld: 5.6 % (ref 4.6–6.5)

## 2022-06-18 LAB — HEPATIC FUNCTION PANEL
ALT: 9 U/L (ref 0–35)
AST: 17 U/L (ref 0–37)
Albumin: 4 g/dL (ref 3.5–5.2)
Alkaline Phosphatase: 67 U/L (ref 39–117)
Bilirubin, Direct: 0.1 mg/dL (ref 0.0–0.3)
Total Bilirubin: 0.4 mg/dL (ref 0.2–1.2)
Total Protein: 6.9 g/dL (ref 6.0–8.3)

## 2022-06-18 LAB — VITAMIN D 25 HYDROXY (VIT D DEFICIENCY, FRACTURES): VITD: 28.46 ng/mL — ABNORMAL LOW (ref 30.00–100.00)

## 2022-06-18 MED ORDER — CEPHALEXIN 500 MG PO CAPS: 500.0000 mg | ORAL_CAPSULE | Freq: Three times a day (TID) | ORAL | 0 refills | Status: DC

## 2022-06-18 NOTE — Assessment & Plan Note (Signed)
Last vitamin D Lab Results  Component Value Date   VD25OH 30.02 12/10/2021   Low, to start oral replacement

## 2022-06-18 NOTE — Progress Notes (Signed)
Patient ID: [name], female   DOB: May 13, 1941, 81 y.o.   MRN: [id number]        Chief Complaint: follow up HTN, HLD and hyperglycemia , chronic neck pain       HPI:  [name] is a 81 y.o. female here with c/o persistent post neck pain  and polyarthralgias; has done well before with tramadol prn, Has appt with rheumatology soon.  Pt denies chest pain, increased sob or doe, wheezing, orthopnea, PND, increased LE swelling, palpitations, dizziness or syncope.   Pt denies polydipsia, polyuria, or new focal neuro s/s.    Pt denies fever, unintentional wt loss, night sweats, loss of appetite, or other constitutional symptoms  but incidentally with 6 cm area distal RLE with 3 days worsening redness, tender without ulcer or drainage.  Did have mild diarrhea last wk some improved but o/w Denies worsening reflux, abd pain, dysphagia, n/v, bowel change or blood.  Wt Readings from Last 3 Encounters:  06/18/22 217 lb (98.4 kg)  12/10/21 222 lb (100.7 kg)  09/24/21 221 lb (100.2 kg)   BP Readings from Last 3 Encounters:  06/18/22 122/64  04/30/22 (!) 158/88  12/10/21 128/76         Past Medical History:  Diagnosis Date   Allergic rhinitis    Arnold-Chiari malformation (HCC)    Asthma    CAD (coronary artery disease)    COPD (chronic obstructive pulmonary disease) (HCC)    Coughing    coughing since last 01-22-2019 , started on cefdinir bid x10 days, has 3 pills left today , reports she feels mcuh better , still coughing copiuus amonts of thick white sputum , denies fever nor chills, nor body aches .    DJD (degenerative joint disease)    GERD (gastroesophageal reflux disease)    History of absence seizures    last confirmed seizure age 18     Hx of colonic polyps    Hypercholesteremia    Hypertension    Obesity    Positive PPD    Reflex sympathetic dystrophy    Varicose veins    Venous insufficiency    Past Surgical History:  Procedure Laterality Date   ABDOMINAL HYSTERECTOMY      cspine surgery  1993   for arnold-chiari malformation   JOINT REPLACEMENT  06/05/11   Left total knee replacement   right knee arthroscopy  12/2007   Dr. Tonita Cong   right total knee replacement  05/2008   Dr. Tonita Cong   TOTAL KNEE REVISION Left 02/03/2019   Procedure: LEFT TOTAL KNEE REVISION;  Surgeon: Rod Can, MD;  Location: [facility];  Service: Orthopedics;  Laterality: Left;    reports that she quit smoking about 10 years ago. Her smoking use included cigarettes. She has a 3.60 pack-year smoking history. She has never used smokeless tobacco. She reports that she does not drink alcohol and does not use drugs. family history includes Clotting disorder in her father; Heart disease in her mother; Stroke in her father and mother. No Known Allergies Current Outpatient Medications on File Prior to Visit  Medication Sig Dispense Refill   albuterol (PROVENTIL HFA;VENTOLIN HFA) 108 (90 BASE) MCG/ACT inhaler Inhale 2 puffs into the lungs every 4 (four) hours as needed. For wheeze or shortness of breath 1 Inhaler 6   alendronate (FOSAMAX) 70 MG tablet TAKE 1 TABLET BY MOUTH EVERY 7 DAYS. TAKE WITH A FULL GLASS OF WATER ON AN EMPTY STOMACH. 12 tablet 2   aspirin  81 MG tablet Take 81 mg by mouth daily.     azelastine (ASTELIN) 0.1 % nasal spray Place 1 spray into both nostrils daily as needed for rhinitis or allergies.      budesonide-formoterol (SYMBICORT) 160-4.5 MCG/ACT inhaler      [facility] D3 50 MCG (2000 UT) CAPS Take 1 capsule by mouth daily.     diclofenac Sodium (VOLTAREN) 1 % GEL Voltaren Arthritis Pain 1 % topical gel  APPLY 2 GRAMS TO THE AFFECTED AREA(S) BY TOPICAL ROUTE 4 TIMES PER DAY     donepezil (ARICEPT ODT) 5 MG disintegrating tablet Take 1 tablet (5 mg total) by mouth at bedtime. 90 tablet 3   EPINEPHrine 0.3 mg/0.3 mL IJ SOAJ injection Inject 0.3 mg into the muscle as needed for anaphylaxis.     famotidine (PEPCID) 20 MG tablet TAKE 1 TABLET BY MOUTH TWICE A DAY 180 tablet 1   ferrous  sulfate 325 (65 FE) MG EC tablet Take 325 mg by mouth daily.     fluticasone (FLONASE) 50 MCG/ACT nasal spray Place 1 spray into both nostrils 2 (two) times daily as needed for allergies or rhinitis.     furosemide (LASIX) 40 MG tablet TAKE 2 TABLETS BY MOUTH EVERY DAY NEED OFFICE VISIT 180 tablet 2   meloxicam (MOBIC) 15 MG tablet TAKE 1 TABLET BY MOUTH EVERY DAY AS NEEDED FOR PAIN 30 tablet 5   montelukast (SINGULAIR) 10 MG tablet TAKE 1 TABLET BY MOUTH EVERY DAY IN THE EVENING 90 tablet 1   oxyCODONE-acetaminophen (PERCOCET) 10-325 MG tablet Take 1 tablet by mouth 3 (three) times daily as needed for pain.     pantoprazole (PROTONIX) 40 MG tablet TAKE 1 TABLET BY MOUTH EVERY DAY 90 tablet 1   potassium chloride SA (KLOR-CON M20) 20 MEQ tablet Take 1 tablet (20 mEq total) by mouth 2 (two) times daily. 180 tablet 3   Sodium Chloride-Sodium Bicarb (SINUS [place] SQUEEZE BOTTLE) 2300-700 MG KIT See admin instructions.     Tiotropium Bromide Monohydrate (SPIRIVA RESPIMAT) 1.25 MCG/ACT AERS Inhale 1.25 mcg into the lungs daily.     tiZANidine (ZANAFLEX) 2 MG tablet Take 1 tablet (2 mg total) by mouth every 6 (six) hours as needed for muscle spasms. 40 tablet 1   traMADol (ULTRAM) 50 MG tablet TAKE 1 TABLET BY MOUTH EVERY 6 HOURS AS NEEDED. 60 tablet 1   traZODone (DESYREL) 100 MG tablet TAKE 1 TABLET BY MOUTH EVERYDAY AT BEDTIME 90 tablet 1   citalopram (CELEXA) 10 MG tablet Take 1 tablet (10 mg total) by mouth daily. 90 tablet 3   rosuvastatin (CRESTOR) 40 MG tablet Take 1 tablet (40 mg total) by mouth daily. 90 tablet 3   No current facility-administered medications on file prior to visit.        ROS:  All others reviewed and negative.  Objective        PE:  BP 122/64 (BP Location: Right Arm, Patient Position: Sitting, Cuff Size: Large)   Pulse (!) 45   Temp 99 F (37.2 C) (Oral)   Ht $R'5\' 4"'YM$  (1.626 m)   Wt 217 lb (98.4 kg)   SpO2 97%   BMI 37.25 kg/m                 Constitutional: Pt  appears in NAD               HENT: Head: NCAT.  Right Ear: External ear normal.                 Left Ear: External ear normal.                Eyes: . Pupils are equal, round, and reactive to light. Conjunctivae and EOM are normal               Nose: without d/c or deformity               Neck: Neck supple. Gross normal ROM               Cardiovascular: Normal rate and regular rhythm.                 Pulmonary/Chest: Effort normal and breath sounds without rales or wheezing.                Abd:  Soft, NT, ND, + BS, no organomegaly               Neurological: Pt is alert. At baseline orientation, motor grossly intact               Skin:  LE edema - chronic 1+ bilat but also right medial distal leg with 6 cm area red, tender, swelling without ulcer o/w neurovasc intact               Psychiatric: Pt behavior is normal without agitation   Micro: none  Cardiac tracings I have personally interpreted today:  none  Pertinent Radiological findings (summarize): none   Lab Results  Component Value Date   WBC 2.5 (L) 06/18/2022   HGB 11.3 (L) 06/18/2022   HCT 35.5 (L) 06/18/2022   PLT 147.0 (L) 06/18/2022   GLUCOSE 85 06/18/2022   CHOL 151 06/18/2022   TRIG 74.0 06/18/2022   HDL 58.30 06/18/2022   LDLCALC 78 06/18/2022   ALT 9 06/18/2022   AST 17 06/18/2022   NA 140 06/18/2022   K 4.5 06/18/2022   CL 103 06/18/2022   CREATININE 0.84 06/18/2022   BUN 12 06/18/2022   CO2 30 06/18/2022   TSH 1.64 12/10/2021   INR 1.14 06/16/2011   HGBA1C 5.6 06/18/2022   Assessment/Plan:  [name] is a 81 y.o. Black or African American [2] female with  has a past medical history of Allergic rhinitis, Arnold-Chiari malformation (HCC), Asthma, CAD (coronary artery disease), COPD (chronic obstructive pulmonary disease) (HCC), Coughing, DJD (degenerative joint disease), GERD (gastroesophageal reflux disease), History of absence seizures, colonic polyps, Hypercholesteremia, Hypertension,  Obesity, Positive PPD, Reflex sympathetic dystrophy, Varicose veins, and Venous insufficiency.  Vitamin D deficiency Last vitamin D Lab Results  Component Value Date   VD25OH 30.02 12/10/2021   Low, to start oral replacement   Hyperglycemia Lab Results  Component Value Date   HGBA1C 5.6 06/18/2022   Stable, pt to continue current medical treatment  - diet, wt control   Hypercholesteremia Lab Results  Component Value Date   LDLCALC 78 06/18/2022   Uncontrolled, goal ldl < 70, pt to continue current statin crestor 40 qd and for lower hol diet as declines other change  Essential hypertension BP Readings from Last 3 Encounters:  06/18/22 122/64  04/30/22 (!) 158/88  12/10/21 128/76   Stable, pt to continue medical treatment  - diet, low salt, wt control   Cellulitis of right leg Mild to mod, for antibx course - cephalexin,  to f/u any worsening symptoms or concerns  Followup: Return in about 6 months (around 12/19/2022).  Cathlean Cower, MD 06/21/2022 5:43 PM [facility]

## 2022-06-18 NOTE — Patient Instructions (Signed)
Please take all new medication as prescribed- - the antibiotic  Please also take an OTC Probiotic such as Align until the diarrhea improved  Please continue all other medications as before, and refills have been done if requested.  Please have the pharmacy call with any other refills you may need.  Please keep your appointments with your specialists as you may have planned - rheumatology on Aug 4  Please go to the LAB at the blood drawing area for the tests to be done  You will be contacted by phone if any changes need to be made immediately.  Otherwise, you will receive a letter about your results with an explanation, but please check with MyChart first.  Please remember to sign up for MyChart if you have not done so, as this will be important to you in the future with finding out test results, communicating by private email, and scheduling acute appointments online when needed.  Please make an Appointment to return in 6 months, or sooner if needed

## 2022-06-21 ENCOUNTER — Encounter: Payer: Self-pay | Admitting: Internal Medicine

## 2022-06-21 NOTE — Assessment & Plan Note (Signed)
Mild to mod, for antibx course - cephalexin,  to f/u any worsening symptoms or concerns

## 2022-06-21 NOTE — Assessment & Plan Note (Signed)
BP Readings from Last 3 Encounters:  06/18/22 122/64  04/30/22 (!) 158/88  12/10/21 128/76   Stable, pt to continue medical treatment  - diet, low salt, wt control

## 2022-06-21 NOTE — Assessment & Plan Note (Signed)
Lab Results  Component Value Date   LDLCALC 78 06/18/2022   Uncontrolled, goal ldl < 70, pt to continue current statin crestor 40 qd and for lower hol diet as declines other change

## 2022-06-21 NOTE — Assessment & Plan Note (Signed)
Lab Results  Component Value Date   HGBA1C 5.6 06/18/2022   Stable, pt to continue current medical treatment  - diet, wt control

## 2022-06-30 ENCOUNTER — Ambulatory Visit: Payer: Medicare PPO | Admitting: Podiatry

## 2022-06-30 ENCOUNTER — Encounter: Payer: Self-pay | Admitting: Podiatry

## 2022-06-30 DIAGNOSIS — Z89422 Acquired absence of other left toe(s): Secondary | ICD-10-CM

## 2022-06-30 DIAGNOSIS — M79675 Pain in left toe(s): Secondary | ICD-10-CM

## 2022-06-30 DIAGNOSIS — B351 Tinea unguium: Secondary | ICD-10-CM | POA: Diagnosis not present

## 2022-06-30 DIAGNOSIS — M79674 Pain in right toe(s): Secondary | ICD-10-CM

## 2022-06-30 DIAGNOSIS — I739 Peripheral vascular disease, unspecified: Secondary | ICD-10-CM | POA: Diagnosis not present

## 2022-07-04 DIAGNOSIS — Z1231 Encounter for screening mammogram for malignant neoplasm of breast: Secondary | ICD-10-CM | POA: Diagnosis not present

## 2022-07-04 NOTE — Progress Notes (Signed)
  Subjective:  Patient ID: [name], female    DOB: [birth year],  MRN: [id number]  [name] presents to clinic today for at risk foot care. Patient has h/o amputation of digital amputation L 2nd toe and painful, discolored, thick toenails which interfere with daily activities  Patient states she has been having problems with leg swelling. She states she is presently on antibiotics.  She is asking to have felt heel lift removed from her left shoe. She states she purchase a gel heel pad for the shoe.  PCP is Biagio Borg, MD , and last visit was  June 18, 2022  No Known Allergies  Review of Systems: Negative except as noted in the HPI.  Objective:  Constitutional [name] is a pleasant 81 y.o. African American female, in NAD. AAO x 3.   Vascular CFT <3 seconds b/l LE. Palpable DP/PT pulses b/l LE. Digital hair absent b/l. Skin temperature gradient WNL b/l. No pain with calf compression b/l. +1 pitting edema b/l LE. Slight warmth. No fluctuance, no ascending cellulitis. No cyanosis or clubbing noted b/l LE.  Neurologic Normal speech. Oriented to person, place, and time. Protective sensation intact 5/5 intact bilaterally with 10g monofilament b/l.  Dermatologic Pedal integument with normal turgor, texture and tone BLE. No open wounds b/l LE. No interdigital macerations noted b/l LE. Toenails left great toe, right 2nd and 3rd toe, bilateral 4th toes, bilateral 5th toes, elongated, discolored, dystrophic, thickened, and crumbly with subungual debris and tenderness to dorsal palpation. Anonychia right great toe, left 3rd toe, right 2nd toe. No hyperkeratotic nor porokeratotic lesions present on today's visit.   Orthopedic: Muscle strength 5/5 to all lower extremity muscle groups bilaterally. Lower extremity amputation(s): digital amputation L 2nd toe.   Radiographs: None    Latest Ref Rng & Units 06/18/2022   11:43 AM 12/10/2021   10:49 AM  Hemoglobin A1C  Hemoglobin-A1c 4.6 - 6.5 %  5.6  6.0    Assessment/Plan: 1. Pain due to onychomycosis of toenails of both feet   2. Status post amputation of lesser toe of left foot ([place])   3. PVD (peripheral vascular disease) ([place])      -Patient was evaluated and treated. All patient's and/or POA's questions/concerns answered on today's visit. -Patient states she is on antibiotics for her leg swelling. -Removed felt heel lift from left shoe. She will purchase gel pad for this shoe. Continue AFO LLE. -Patient to continue soft, supportive shoe gear daily. -Mycotic toenails 3-5 right foot, left great toe, L 4th toe, and L 5th toe were debrided in length and girth with sterile nail nippers and dremel without iatrogenic bleeding. -Patient/POA to call should there be question/concern in the interim.   Return in about 3 months (around 09/30/2022).  Marzetta Board, DPM

## 2022-07-11 ENCOUNTER — Ambulatory Visit: Payer: Medicare PPO | Admitting: Internal Medicine

## 2022-08-01 DIAGNOSIS — J3089 Other allergic rhinitis: Secondary | ICD-10-CM | POA: Diagnosis not present

## 2022-08-13 DIAGNOSIS — I878 Other specified disorders of veins: Secondary | ICD-10-CM | POA: Diagnosis not present

## 2022-08-13 DIAGNOSIS — G629 Polyneuropathy, unspecified: Secondary | ICD-10-CM | POA: Diagnosis not present

## 2022-08-13 DIAGNOSIS — M79641 Pain in right hand: Secondary | ICD-10-CM | POA: Diagnosis not present

## 2022-08-13 DIAGNOSIS — Z6841 Body Mass Index (BMI) 40.0 and over, adult: Secondary | ICD-10-CM | POA: Diagnosis not present

## 2022-08-13 DIAGNOSIS — M79642 Pain in left hand: Secondary | ICD-10-CM | POA: Diagnosis not present

## 2022-08-13 DIAGNOSIS — M1991 Primary osteoarthritis, unspecified site: Secondary | ICD-10-CM | POA: Diagnosis not present

## 2022-08-13 DIAGNOSIS — M79672 Pain in left foot: Secondary | ICD-10-CM | POA: Diagnosis not present

## 2022-08-13 DIAGNOSIS — M79671 Pain in right foot: Secondary | ICD-10-CM | POA: Diagnosis not present

## 2022-08-13 DIAGNOSIS — R600 Localized edema: Secondary | ICD-10-CM | POA: Diagnosis not present

## 2022-08-20 ENCOUNTER — Other Ambulatory Visit: Payer: Self-pay | Admitting: Internal Medicine

## 2022-08-22 DIAGNOSIS — J3089 Other allergic rhinitis: Secondary | ICD-10-CM | POA: Diagnosis not present

## 2022-08-25 DIAGNOSIS — J3081 Allergic rhinitis due to animal (cat) (dog) hair and dander: Secondary | ICD-10-CM | POA: Diagnosis not present

## 2022-08-25 DIAGNOSIS — J3089 Other allergic rhinitis: Secondary | ICD-10-CM | POA: Diagnosis not present

## 2022-08-25 DIAGNOSIS — J301 Allergic rhinitis due to pollen: Secondary | ICD-10-CM | POA: Diagnosis not present

## 2022-08-27 DIAGNOSIS — J3089 Other allergic rhinitis: Secondary | ICD-10-CM | POA: Diagnosis not present

## 2022-08-29 DIAGNOSIS — J3081 Allergic rhinitis due to animal (cat) (dog) hair and dander: Secondary | ICD-10-CM | POA: Diagnosis not present

## 2022-08-29 DIAGNOSIS — J3089 Other allergic rhinitis: Secondary | ICD-10-CM | POA: Diagnosis not present

## 2022-08-29 DIAGNOSIS — J301 Allergic rhinitis due to pollen: Secondary | ICD-10-CM | POA: Diagnosis not present

## 2022-09-02 DIAGNOSIS — I10 Essential (primary) hypertension: Secondary | ICD-10-CM | POA: Diagnosis not present

## 2022-09-02 DIAGNOSIS — F339 Major depressive disorder, recurrent, unspecified: Secondary | ICD-10-CM | POA: Diagnosis not present

## 2022-09-02 DIAGNOSIS — J302 Other seasonal allergic rhinitis: Secondary | ICD-10-CM | POA: Diagnosis not present

## 2022-09-02 DIAGNOSIS — J3089 Other allergic rhinitis: Secondary | ICD-10-CM | POA: Diagnosis not present

## 2022-09-02 DIAGNOSIS — J3081 Allergic rhinitis due to animal (cat) (dog) hair and dander: Secondary | ICD-10-CM | POA: Diagnosis not present

## 2022-09-02 DIAGNOSIS — J301 Allergic rhinitis due to pollen: Secondary | ICD-10-CM | POA: Diagnosis not present

## 2022-09-02 DIAGNOSIS — Z0000 Encounter for general adult medical examination without abnormal findings: Secondary | ICD-10-CM | POA: Diagnosis not present

## 2022-09-02 DIAGNOSIS — J4540 Moderate persistent asthma, uncomplicated: Secondary | ICD-10-CM | POA: Diagnosis not present

## 2022-09-02 DIAGNOSIS — Z23 Encounter for immunization: Secondary | ICD-10-CM | POA: Diagnosis not present

## 2022-09-02 DIAGNOSIS — I739 Peripheral vascular disease, unspecified: Secondary | ICD-10-CM | POA: Diagnosis not present

## 2022-09-02 DIAGNOSIS — Z87891 Personal history of nicotine dependence: Secondary | ICD-10-CM | POA: Diagnosis not present

## 2022-09-04 DIAGNOSIS — J3089 Other allergic rhinitis: Secondary | ICD-10-CM | POA: Diagnosis not present

## 2022-09-10 DIAGNOSIS — J3089 Other allergic rhinitis: Secondary | ICD-10-CM | POA: Diagnosis not present

## 2022-09-10 DIAGNOSIS — J301 Allergic rhinitis due to pollen: Secondary | ICD-10-CM | POA: Diagnosis not present

## 2022-09-10 DIAGNOSIS — J3081 Allergic rhinitis due to animal (cat) (dog) hair and dander: Secondary | ICD-10-CM | POA: Diagnosis not present

## 2022-09-12 DIAGNOSIS — J3089 Other allergic rhinitis: Secondary | ICD-10-CM | POA: Diagnosis not present

## 2022-09-12 DIAGNOSIS — J301 Allergic rhinitis due to pollen: Secondary | ICD-10-CM | POA: Diagnosis not present

## 2022-09-12 DIAGNOSIS — J3081 Allergic rhinitis due to animal (cat) (dog) hair and dander: Secondary | ICD-10-CM | POA: Diagnosis not present

## 2022-09-15 DIAGNOSIS — J3089 Other allergic rhinitis: Secondary | ICD-10-CM | POA: Diagnosis not present

## 2022-09-17 DIAGNOSIS — J3089 Other allergic rhinitis: Secondary | ICD-10-CM | POA: Diagnosis not present

## 2022-09-19 DIAGNOSIS — J301 Allergic rhinitis due to pollen: Secondary | ICD-10-CM | POA: Diagnosis not present

## 2022-09-19 DIAGNOSIS — J3089 Other allergic rhinitis: Secondary | ICD-10-CM | POA: Diagnosis not present

## 2022-09-19 DIAGNOSIS — J3081 Allergic rhinitis due to animal (cat) (dog) hair and dander: Secondary | ICD-10-CM | POA: Diagnosis not present

## 2022-09-22 DIAGNOSIS — J301 Allergic rhinitis due to pollen: Secondary | ICD-10-CM | POA: Diagnosis not present

## 2022-09-22 DIAGNOSIS — J3081 Allergic rhinitis due to animal (cat) (dog) hair and dander: Secondary | ICD-10-CM | POA: Diagnosis not present

## 2022-09-22 DIAGNOSIS — J3089 Other allergic rhinitis: Secondary | ICD-10-CM | POA: Diagnosis not present

## 2022-09-24 ENCOUNTER — Other Ambulatory Visit: Payer: Self-pay | Admitting: Internal Medicine

## 2022-09-24 DIAGNOSIS — I10 Essential (primary) hypertension: Secondary | ICD-10-CM | POA: Diagnosis not present

## 2022-09-24 DIAGNOSIS — E7800 Pure hypercholesterolemia, unspecified: Secondary | ICD-10-CM | POA: Diagnosis not present

## 2022-09-24 DIAGNOSIS — G8929 Other chronic pain: Secondary | ICD-10-CM | POA: Diagnosis not present

## 2022-09-24 DIAGNOSIS — J4540 Moderate persistent asthma, uncomplicated: Secondary | ICD-10-CM | POA: Diagnosis not present

## 2022-09-24 NOTE — Telephone Encounter (Signed)
Please refill as per office routine med refill policy (all routine meds to be refilled for 3 mo or monthly (per pt preference) up to one year from last visit, then month to month grace period for 3 mo, then further med refills will have to be denied) ? ?

## 2022-09-25 DIAGNOSIS — J3081 Allergic rhinitis due to animal (cat) (dog) hair and dander: Secondary | ICD-10-CM | POA: Diagnosis not present

## 2022-09-25 DIAGNOSIS — J301 Allergic rhinitis due to pollen: Secondary | ICD-10-CM | POA: Diagnosis not present

## 2022-09-25 DIAGNOSIS — J3089 Other allergic rhinitis: Secondary | ICD-10-CM | POA: Diagnosis not present

## 2022-09-30 DIAGNOSIS — J3081 Allergic rhinitis due to animal (cat) (dog) hair and dander: Secondary | ICD-10-CM | POA: Diagnosis not present

## 2022-09-30 DIAGNOSIS — J301 Allergic rhinitis due to pollen: Secondary | ICD-10-CM | POA: Diagnosis not present

## 2022-09-30 DIAGNOSIS — J3089 Other allergic rhinitis: Secondary | ICD-10-CM | POA: Diagnosis not present

## 2022-10-02 DIAGNOSIS — J3089 Other allergic rhinitis: Secondary | ICD-10-CM | POA: Diagnosis not present

## 2022-10-02 DIAGNOSIS — J301 Allergic rhinitis due to pollen: Secondary | ICD-10-CM | POA: Diagnosis not present

## 2022-10-02 DIAGNOSIS — J3081 Allergic rhinitis due to animal (cat) (dog) hair and dander: Secondary | ICD-10-CM | POA: Diagnosis not present

## 2022-10-03 DIAGNOSIS — M7631 Iliotibial band syndrome, right leg: Secondary | ICD-10-CM | POA: Diagnosis not present

## 2022-10-03 DIAGNOSIS — M25562 Pain in left knee: Secondary | ICD-10-CM | POA: Diagnosis not present

## 2022-10-03 DIAGNOSIS — T8484XD Pain due to internal orthopedic prosthetic devices, implants and grafts, subsequent encounter: Secondary | ICD-10-CM | POA: Diagnosis not present

## 2022-10-03 DIAGNOSIS — M7051 Other bursitis of knee, right knee: Secondary | ICD-10-CM | POA: Diagnosis not present

## 2022-10-03 DIAGNOSIS — Z96652 Presence of left artificial knee joint: Secondary | ICD-10-CM | POA: Diagnosis not present

## 2022-10-07 DIAGNOSIS — J3089 Other allergic rhinitis: Secondary | ICD-10-CM | POA: Diagnosis not present

## 2022-10-07 DIAGNOSIS — J3081 Allergic rhinitis due to animal (cat) (dog) hair and dander: Secondary | ICD-10-CM | POA: Diagnosis not present

## 2022-10-07 DIAGNOSIS — J301 Allergic rhinitis due to pollen: Secondary | ICD-10-CM | POA: Diagnosis not present

## 2022-10-09 DIAGNOSIS — J3089 Other allergic rhinitis: Secondary | ICD-10-CM | POA: Diagnosis not present

## 2022-10-09 DIAGNOSIS — J3081 Allergic rhinitis due to animal (cat) (dog) hair and dander: Secondary | ICD-10-CM | POA: Diagnosis not present

## 2022-10-09 DIAGNOSIS — J301 Allergic rhinitis due to pollen: Secondary | ICD-10-CM | POA: Diagnosis not present

## 2022-10-13 ENCOUNTER — Ambulatory Visit: Payer: Medicare PPO | Admitting: Podiatry

## 2022-10-14 DIAGNOSIS — J301 Allergic rhinitis due to pollen: Secondary | ICD-10-CM | POA: Diagnosis not present

## 2022-10-14 DIAGNOSIS — J3089 Other allergic rhinitis: Secondary | ICD-10-CM | POA: Diagnosis not present

## 2022-10-14 DIAGNOSIS — J3081 Allergic rhinitis due to animal (cat) (dog) hair and dander: Secondary | ICD-10-CM | POA: Diagnosis not present

## 2022-10-20 DIAGNOSIS — J3081 Allergic rhinitis due to animal (cat) (dog) hair and dander: Secondary | ICD-10-CM | POA: Diagnosis not present

## 2022-10-20 DIAGNOSIS — J301 Allergic rhinitis due to pollen: Secondary | ICD-10-CM | POA: Diagnosis not present

## 2022-10-20 DIAGNOSIS — J3089 Other allergic rhinitis: Secondary | ICD-10-CM | POA: Diagnosis not present

## 2022-10-27 DIAGNOSIS — J3081 Allergic rhinitis due to animal (cat) (dog) hair and dander: Secondary | ICD-10-CM | POA: Diagnosis not present

## 2022-10-27 DIAGNOSIS — J301 Allergic rhinitis due to pollen: Secondary | ICD-10-CM | POA: Diagnosis not present

## 2022-10-27 DIAGNOSIS — J3089 Other allergic rhinitis: Secondary | ICD-10-CM | POA: Diagnosis not present

## 2022-10-30 ENCOUNTER — Emergency Department (HOSPITAL_COMMUNITY)
Admission: EM | Admit: 2022-10-30 | Discharge: 2022-10-30 | Disposition: A | Discharge status: Home / Self Care | Payer: Medicare PPO | Attending: Emergency Medicine | Admitting: Emergency Medicine

## 2022-10-30 ENCOUNTER — Encounter (HOSPITAL_COMMUNITY): Payer: Self-pay

## 2022-10-30 ENCOUNTER — Emergency Department (HOSPITAL_COMMUNITY): Payer: Medicare PPO

## 2022-10-30 ENCOUNTER — Other Ambulatory Visit: Payer: Self-pay

## 2022-10-30 DIAGNOSIS — M40202 Unspecified kyphosis, cervical region: Secondary | ICD-10-CM | POA: Diagnosis not present

## 2022-10-30 DIAGNOSIS — M546 Pain in thoracic spine: Secondary | ICD-10-CM | POA: Insufficient documentation

## 2022-10-30 DIAGNOSIS — Z7982 Long term (current) use of aspirin: Secondary | ICD-10-CM | POA: Insufficient documentation

## 2022-10-30 DIAGNOSIS — I2510 Atherosclerotic heart disease of native coronary artery without angina pectoris: Secondary | ICD-10-CM | POA: Diagnosis not present

## 2022-10-30 DIAGNOSIS — M40204 Unspecified kyphosis, thoracic region: Secondary | ICD-10-CM | POA: Diagnosis not present

## 2022-10-30 DIAGNOSIS — R079 Chest pain, unspecified: Secondary | ICD-10-CM | POA: Diagnosis not present

## 2022-10-30 DIAGNOSIS — Z79899 Other long term (current) drug therapy: Secondary | ICD-10-CM | POA: Diagnosis not present

## 2022-10-30 DIAGNOSIS — M5030 Other cervical disc degeneration, unspecified cervical region: Secondary | ICD-10-CM | POA: Insufficient documentation

## 2022-10-30 DIAGNOSIS — Z7951 Long term (current) use of inhaled steroids: Secondary | ICD-10-CM | POA: Diagnosis not present

## 2022-10-30 DIAGNOSIS — M542 Cervicalgia: Secondary | ICD-10-CM | POA: Diagnosis not present

## 2022-10-30 DIAGNOSIS — R059 Cough, unspecified: Secondary | ICD-10-CM | POA: Diagnosis not present

## 2022-10-30 DIAGNOSIS — M5136 Other intervertebral disc degeneration, lumbar region: Secondary | ICD-10-CM | POA: Diagnosis not present

## 2022-10-30 DIAGNOSIS — M47812 Spondylosis without myelopathy or radiculopathy, cervical region: Secondary | ICD-10-CM | POA: Diagnosis not present

## 2022-10-30 DIAGNOSIS — J449 Chronic obstructive pulmonary disease, unspecified: Secondary | ICD-10-CM | POA: Insufficient documentation

## 2022-10-30 DIAGNOSIS — I10 Essential (primary) hypertension: Secondary | ICD-10-CM | POA: Diagnosis not present

## 2022-10-30 DIAGNOSIS — M545 Low back pain, unspecified: Secondary | ICD-10-CM | POA: Diagnosis not present

## 2022-10-30 MED ORDER — MELOXICAM 15 MG PO TABS: ORAL_TABLET | ORAL | 5 refills | Status: DC

## 2022-10-30 MED ORDER — LIDOCAINE 5 % EX PTCH: 1.0000 | MEDICATED_PATCH | CUTANEOUS | 0 refills | Status: AC

## 2022-10-30 MED ORDER — ACETAMINOPHEN 325 MG PO TABS
650.0000 mg | ORAL_TABLET | Freq: Once | ORAL | Status: AC
  Administered 2022-10-30: 650 mg via ORAL
  Filled 2022-10-30: qty 2

## 2022-10-30 MED ORDER — HYDROCODONE-ACETAMINOPHEN 5-325 MG PO TABS: 1.0000 | ORAL_TABLET | Freq: Four times a day (QID) | ORAL | 0 refills | Status: DC | PRN

## 2022-10-30 NOTE — ED Triage Notes (Signed)
Patient c/o a non productive cough that started this AM.  Patient also c/o right shoulder blade, right lateral neck, left lower back pain that radiates down th eleft leg since last night.

## 2022-10-30 NOTE — ED Provider Notes (Signed)
[facility] DEPT Provider Note   CSN: [id number] Arrival date & time: 10/30/22  0957     History  Chief Complaint  Patient presents with   Cough   Neck Pain   Back Pain    [name] is a 81 y.o. female.   Cough Neck Pain Back Pain    Patient has a history of COPD, hypertension, coronary artery disease, hypercholesterolemia, GERD, degenerative joint disease who presents to the ED with complaints of pain in her neck and mid back.  Patient states she denies any recent falls or trauma.  Today she started having pain in the neck more on the right side also into her shoulder blade.  Sometimes she also has pain in her lower back going down her left leg.  She denies any recent fevers or chills.  She has had some intermittent coughing.  No complaints of shortness of breath.  No leg swelling.  Home Medications Prior to Admission medications   Medication Sig Start Date End Date Taking? Authorizing Provider  HYDROcodone-acetaminophen (NORCO/VICODIN) 5-325 MG tablet Take 1 tablet by mouth every 6 (six) hours as needed for severe pain. 10/30/22  Yes Dorie Rank, MD  lidocaine (LIDODERM) 5 % Place 1 patch onto the skin daily. Remove & Discard patch within 12 hours or as directed by MD 10/30/22  Yes Dorie Rank, MD  albuterol (PROVENTIL HFA;VENTOLIN HFA) 108 (90 BASE) MCG/ACT inhaler Inhale 2 puffs into the lungs every 4 (four) hours as needed. For wheeze or shortness of breath 03/26/12   Noralee Space, MD  alendronate (FOSAMAX) 70 MG tablet TAKE 1 TABLET BY MOUTH EVERY 7 DAYS. TAKE WITH A FULL GLASS OF WATER ON AN EMPTY STOMACH. 04/04/22   Biagio Borg, MD  aspirin 81 MG tablet Take 81 mg by mouth daily.    [provider]  azelastine (ASTELIN) 0.1 % nasal spray Place 1 spray into both nostrils daily as needed for rhinitis or allergies.  11/07/18   [provider]  budesonide-formoterol (SYMBICORT) 160-4.5 MCG/ACT inhaler     [provider]  cephALEXin (KEFLEX) 500 MG capsule Take 1 capsule (500 mg total) by mouth 3 (three) times daily. 06/18/22   Biagio Borg, MD  citalopram (CELEXA) 10 MG tablet Take 1 tablet (10 mg total) by mouth daily. 05/07/21 05/07/22  Biagio Borg, MD  [facility] D3 50 MCG (2000 UT) CAPS Take 1 capsule by mouth daily. 01/09/21   [provider]  diclofenac Sodium (VOLTAREN) 1 % GEL Voltaren Arthritis Pain 1 % topical gel  APPLY 2 GRAMS TO THE AFFECTED AREA(S) BY TOPICAL ROUTE 4 TIMES PER DAY    [provider]  donepezil (ARICEPT ODT) 5 MG disintegrating tablet Take 1 tablet (5 mg total) by mouth at bedtime. 05/07/21   Biagio Borg, MD  EPINEPHrine 0.3 mg/0.3 mL IJ SOAJ injection Inject 0.3 mg into the muscle as needed for anaphylaxis.    [provider]  famotidine (PEPCID) 20 MG tablet TAKE 1 TABLET BY MOUTH TWICE A DAY 05/20/22   Biagio Borg, MD  ferrous sulfate 325 (65 FE) MG EC tablet Take 325 mg by mouth daily.    [provider]  fluticasone (FLONASE) 50 MCG/ACT nasal spray Place 1 spray into both nostrils 2 (two) times daily as needed for allergies or rhinitis.    [provider]  furosemide (LASIX) 40 MG tablet TAKE 2 TABLETS BY MOUTH EVERY DAY NEED OFFICE VISIT 03/31/22  Biagio Borg, MD  meloxicam (MOBIC) 15 MG tablet TAKE 1 TABLET BY MOUTH EVERY DAY AS NEEDED FOR PAIN 10/30/22   Dorie Rank, MD  montelukast (SINGULAIR) 10 MG tablet TAKE 1 TABLET BY MOUTH EVERY DAY IN THE EVENING 05/20/22   Biagio Borg, MD  oxyCODONE-acetaminophen (PERCOCET) 10-325 MG tablet Take 1 tablet by mouth 3 (three) times daily as needed for pain. 02/05/21   [provider]  pantoprazole (PROTONIX) 40 MG tablet TAKE 1 TABLET BY MOUTH EVERY DAY 05/20/22   Biagio Borg, MD  potassium chloride SA (KLOR-CON M20) 20 MEQ tablet Take 1 tablet (20 mEq total) by mouth 2 (two) times daily. 05/07/21   Biagio Borg, MD  rosuvastatin (CRESTOR) 40 MG tablet Take 1 tablet (40 mg total) by mouth  daily. 05/07/21 12/10/21  Biagio Borg, MD  Sodium Chloride-Sodium Bicarb (SINUS [place] SQUEEZE BOTTLE) 2300-700 MG KIT See admin instructions.    [provider]  Tiotropium Bromide Monohydrate (SPIRIVA RESPIMAT) 1.25 MCG/ACT AERS Inhale 1.25 mcg into the lungs daily.    [provider]  tiZANidine (ZANAFLEX) 2 MG tablet Take 1 tablet (2 mg total) by mouth every 6 (six) hours as needed for muscle spasms. 06/07/21   Biagio Borg, MD  traMADol Veatrice [place]) 50 MG tablet TAKE 1 TABLET BY MOUTH EVERY 6 HOURS AS NEEDED. 06/15/22   Biagio Borg, MD  traZODone (DESYREL) 100 MG tablet TAKE 1 TABLET BY MOUTH EVERYDAY AT BEDTIME 08/20/22   Biagio Borg, MD      Allergies    Patient has no known allergies.    Review of Systems   Review of Systems  Respiratory:  Positive for cough.   Musculoskeletal:  Positive for back pain and neck pain.    Physical Exam Updated Vital Signs BP 118/72 (BP Location: Left Arm)   Pulse (!) 50   Temp 98.4 F (36.9 C) (Oral)   Resp 16   Ht 1.638 m (5' 4.5")   Wt 96.2 kg   SpO2 97%   BMI 35.83 kg/m  Physical Exam Vitals and nursing note reviewed.  Constitutional:      General: She is not in acute distress.    Appearance: She is well-developed.  HENT:     Head: Normocephalic and atraumatic.     Right Ear: External ear normal.     Left Ear: External ear normal.  Eyes:     General: No scleral icterus.       Right eye: No discharge.        Left eye: No discharge.     Conjunctiva/sclera: Conjunctivae normal.  Neck:     Trachea: No tracheal deviation.  Cardiovascular:     Rate and Rhythm: Normal rate.  Pulmonary:     Effort: Pulmonary effort is normal. No respiratory distress.     Breath sounds: Normal breath sounds. No stridor.  Abdominal:     General: There is no distension.     Palpations: There is no mass.     Tenderness: There is no abdominal tenderness.  Musculoskeletal:        General: No swelling or deformity.     Cervical back: Neck  supple. Tenderness present. No rigidity. Pain with movement present.     Thoracic back: Tenderness present. No swelling or deformity.     Right lower leg: No edema.     Left lower leg: No edema.  Skin:    General: Skin is warm and dry.  Findings: No rash.  Neurological:     Mental Status: She is alert.     Cranial Nerves: Cranial nerve deficit: no gross deficits.     ED Results / Procedures / Treatments   Labs (all labs ordered are listed, but only abnormal results are displayed) Labs Reviewed - No data to display  EKG None  Radiology DG Cervical Spine Complete  Result Date: 10/30/2022 CLINICAL DATA:  Nonproductive cough with root shoulder, lateral neck and low back pain since last night. No reported acute injury. EXAM: CERVICAL SPINE - COMPLETE 4+ VIEW COMPARISON:  CT cervical spine 04/29/2022. FINDINGS: Image quality degraded by body habitus. The cervicothoracic junction is not well visualized in the lateral projection. There is a chronic cervical kyphosis with a chronic anterolisthesis at C2-3 measuring approximately 8 mm, similar to previous CT. There is multilevel spondylosis with disc space narrowing, uncinate spurring and facet hypertrophy. Resulting mild to moderate foraminal narrowing at multiple levels, similar to prior CT. No acute findings are evident. IMPRESSION: 1. Multilevel cervical spondylosis with chronic anterolisthesis at C2-3, similar to previous CT. 2. No acute findings are identified. This study is not optimal for exclusion of acute injury, and if history or symptoms warrant, further evaluation with follow-up CT should be considered. Electronically Signed   By: Richardean Sale M.D.   On: 10/30/2022 11:39   DG Chest 2 View  Result Date: 10/30/2022 CLINICAL DATA:  Nonproductive cough.  Neck pain. EXAM: CHEST - 2 VIEW COMPARISON:  01/09/21 CXR FINDINGS: No pleural effusion. No pneumothorax. No focal airspace opacity. Persistent enlarged cardiac and mediastinal  contours, not significantly changed compared to comparison chest radiograph. Visualized upper abdomen is notable for a likely large gallstone. Degenerative changes of the bilateral AC joints. Postprocedural changes of the right humeral head. IMPRESSION: 1. No active cardiopulmonary disease. 2. Persistent enlarged cardiac and mediastinal contours, not significantly changed compared to comparison chest radiograph. 3. Likely large gallstone. Electronically Signed   By: Marin Roberts M.D.   On: 10/30/2022 11:38   DG Thoracic Spine 2 View  Result Date: 10/30/2022 CLINICAL DATA:  Back pain. EXAM: THORACIC SPINE 2 VIEWS COMPARISON:  None Available. FINDINGS: Increased thoracic kyphosis. Multilevel disc degeneration with disc space narrowing and spurring in the mid and lower thoracic spine. No fracture identified. IMPRESSION: Thoracic kyphosis and degenerative change. No acute abnormality. Electronically Signed   By: Franchot Gallo M.D.   On: 10/30/2022 11:36    Procedures Procedures    Medications Ordered in ED Medications  acetaminophen (TYLENOL) tablet 650 mg (650 mg Oral Given 10/30/22 1133)    ED Course/ Medical Decision Making/ A&P                           Medical Decision Making Problems Addressed: Degenerative disc disease, cervical: chronic illness or injury with exacerbation, progression, or side effects of treatment  Amount and/or Complexity of Data Reviewed Radiology: ordered and independent interpretation performed.  Risk OTC drugs. Prescription drug management.   Patient presented to ED with complaints of neck back pain.  Patient is not having any fevers.  No abdominal pain.  X-rays show evidence of degenerative disc disease.  Incidental gallstones noted but she does not have any abdominal tenderness and I do not think this is related to her symptoms.  Patient also has had a cough but x-ray does not show pneumonia patient is afebrile and is breathing easily.  SPECT symptoms  are musculoskeletal in nature.  Will discharge home with medications for pain.  Recommend outpatient follow-up with PCP or spine doctor.        Final Clinical Impression(s) / ED Diagnoses Final diagnoses:  Degenerative disc disease, cervical    Rx / DC Orders ED Discharge Orders          Ordered    HYDROcodone-acetaminophen (NORCO/VICODIN) 5-325 MG tablet  Every 6 hours PRN        10/30/22 1217    lidocaine (LIDODERM) 5 %  Every 24 hours        10/30/22 1217    meloxicam (MOBIC) 15 MG tablet        10/30/22 1217              Dorie Rank, MD 10/30/22 1219

## 2022-10-30 NOTE — Discharge Instructions (Addendum)
Take the medications prescribed to help with your pain.  You can also take the meloxicam.  Follow-up with your primary care doctor or consider seeing a spine doctor for persistent symptoms.  Return for fevers chills or other concerning symptoms

## 2022-11-03 DIAGNOSIS — J3089 Other allergic rhinitis: Secondary | ICD-10-CM | POA: Diagnosis not present

## 2022-11-03 DIAGNOSIS — J3081 Allergic rhinitis due to animal (cat) (dog) hair and dander: Secondary | ICD-10-CM | POA: Diagnosis not present

## 2022-11-03 DIAGNOSIS — J301 Allergic rhinitis due to pollen: Secondary | ICD-10-CM | POA: Diagnosis not present

## 2022-11-07 ENCOUNTER — Other Ambulatory Visit: Payer: Self-pay | Admitting: Internal Medicine

## 2022-11-10 DIAGNOSIS — J3089 Other allergic rhinitis: Secondary | ICD-10-CM | POA: Diagnosis not present

## 2022-11-10 DIAGNOSIS — J301 Allergic rhinitis due to pollen: Secondary | ICD-10-CM | POA: Diagnosis not present

## 2022-11-10 DIAGNOSIS — J3081 Allergic rhinitis due to animal (cat) (dog) hair and dander: Secondary | ICD-10-CM | POA: Diagnosis not present

## 2022-11-17 DIAGNOSIS — J3089 Other allergic rhinitis: Secondary | ICD-10-CM | POA: Diagnosis not present

## 2022-11-17 DIAGNOSIS — J301 Allergic rhinitis due to pollen: Secondary | ICD-10-CM | POA: Diagnosis not present

## 2022-11-17 DIAGNOSIS — J3081 Allergic rhinitis due to animal (cat) (dog) hair and dander: Secondary | ICD-10-CM | POA: Diagnosis not present

## 2022-11-18 DIAGNOSIS — R197 Diarrhea, unspecified: Secondary | ICD-10-CM | POA: Diagnosis not present

## 2022-11-18 DIAGNOSIS — R1031 Right lower quadrant pain: Secondary | ICD-10-CM | POA: Diagnosis not present

## 2022-11-21 ENCOUNTER — Other Ambulatory Visit: Payer: Self-pay | Admitting: Internal Medicine

## 2022-11-25 DIAGNOSIS — M79605 Pain in left leg: Secondary | ICD-10-CM | POA: Diagnosis not present

## 2022-11-25 DIAGNOSIS — G8929 Other chronic pain: Secondary | ICD-10-CM | POA: Diagnosis not present

## 2022-11-27 ENCOUNTER — Other Ambulatory Visit: Payer: Self-pay | Admitting: Internal Medicine

## 2022-11-27 NOTE — Telephone Encounter (Signed)
Please refill as per office routine med refill policy (all routine meds to be refilled for 3 mo or monthly (per pt preference) up to one year from last visit, then month to month grace period for 3 mo, then further med refills will have to be denied) ? ?

## 2022-12-02 IMAGING — CT CT HEAD W/O CM
3 of 5 series · 14 of 47 positions shown, 16 images · non-contrast
Comparison: CT scan head and cervical spine both 04/23/2021.

CLINICAL DATA: Fall injury, head and neck trauma.



[Series 4: head 2.0 h70h · axial · 0.46mm/px · z∈[-589,-453]mm · 8 of 78 slices shown, 10 images]
[im 5/78  brain]
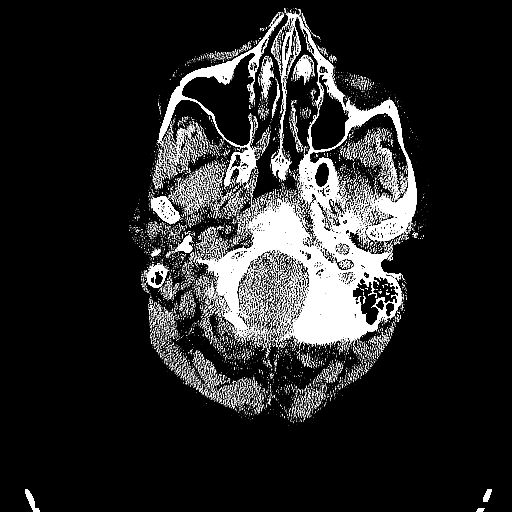
[im 5/78  bone]
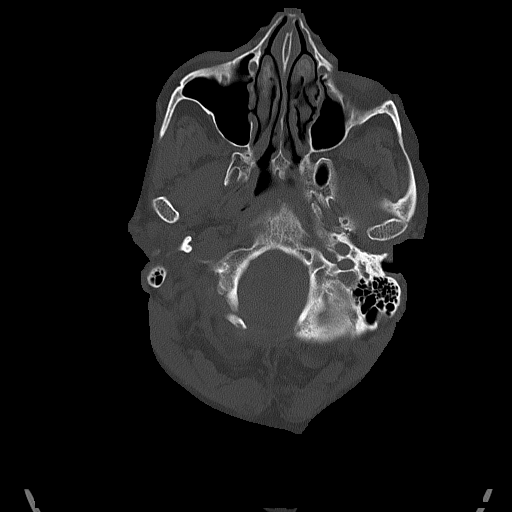
[im 15/78  brain]
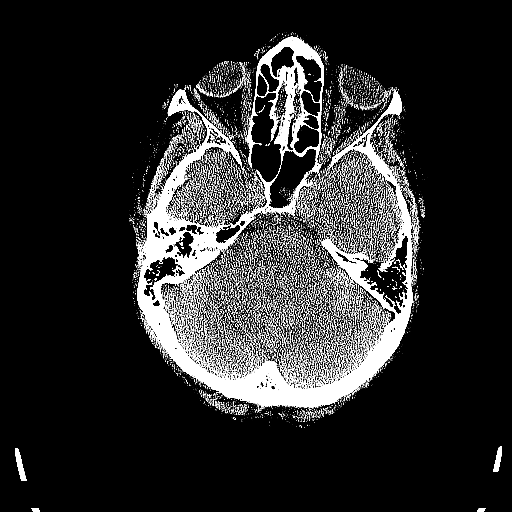
[im 25/78  brain]
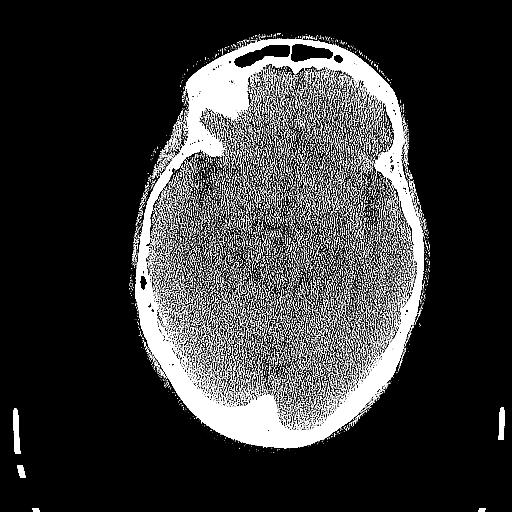
[im 34/78  brain]
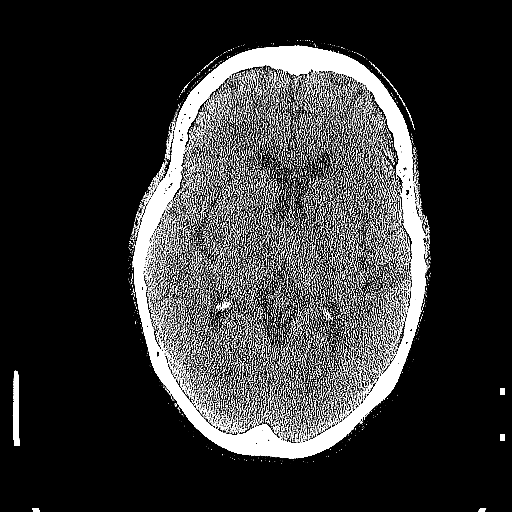
[im 44/78  brain]
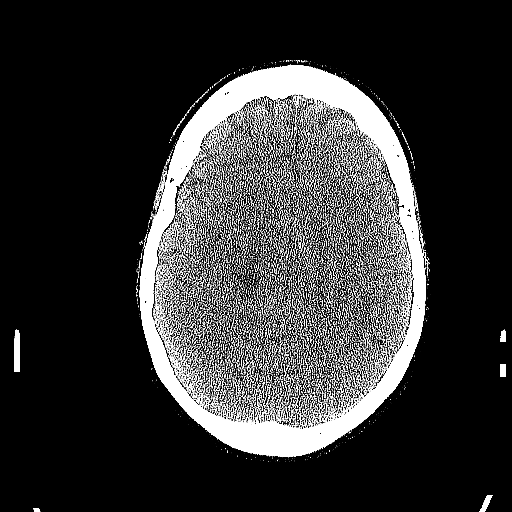
[im 44/78  bone]
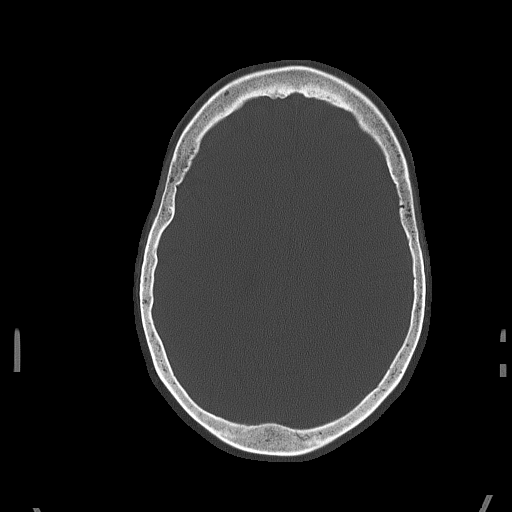
[im 53/78  brain]
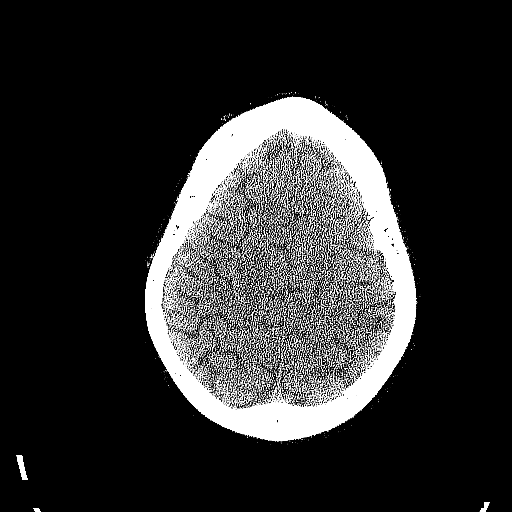
[im 63/78  brain]
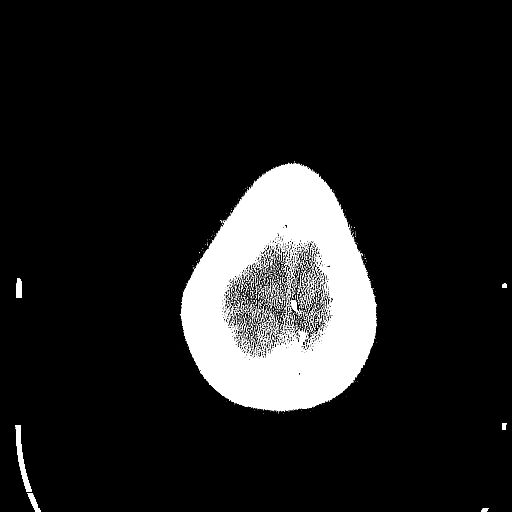
[im 73/78  brain]
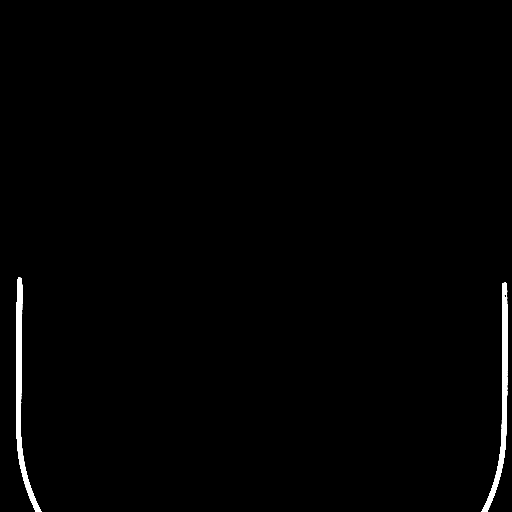

[Series 5: head 3.0 mpr cor · coronal · 0.32mm/px · 3 of 71 slices shown]
[im 24/71  brain]
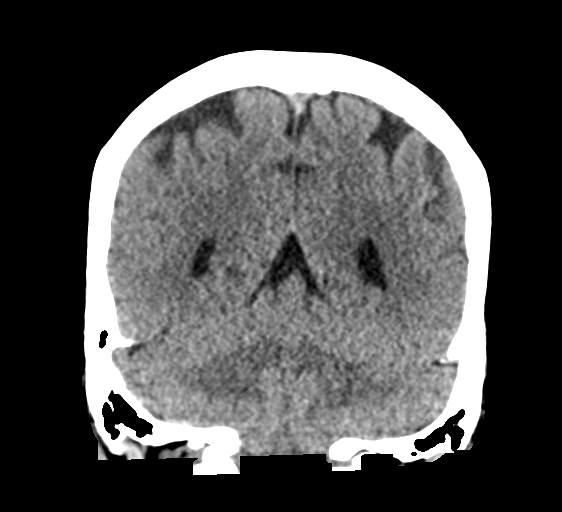
[im 32/71  brain]
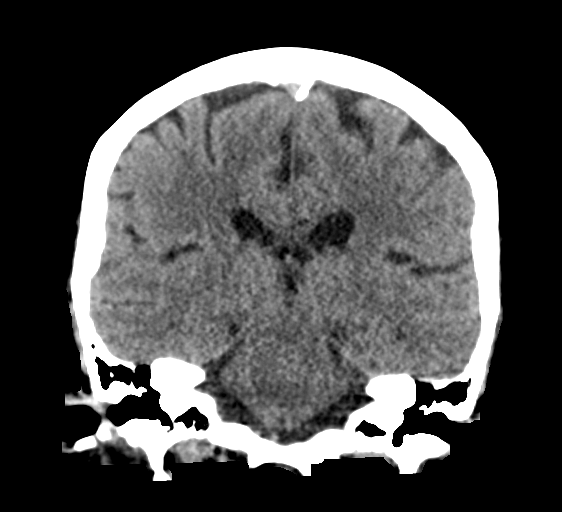
[im 39/71  brain]
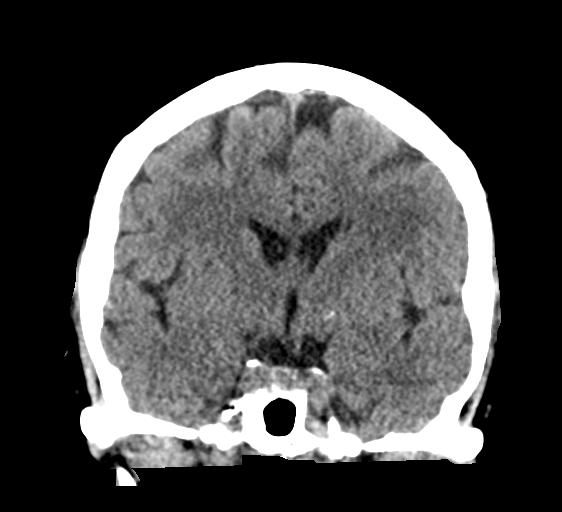

[Series 6: head 3.0 mpr sag · sagittal · 0.30mm/px · 3 of 59 slices shown]
[im 20/59  brain]
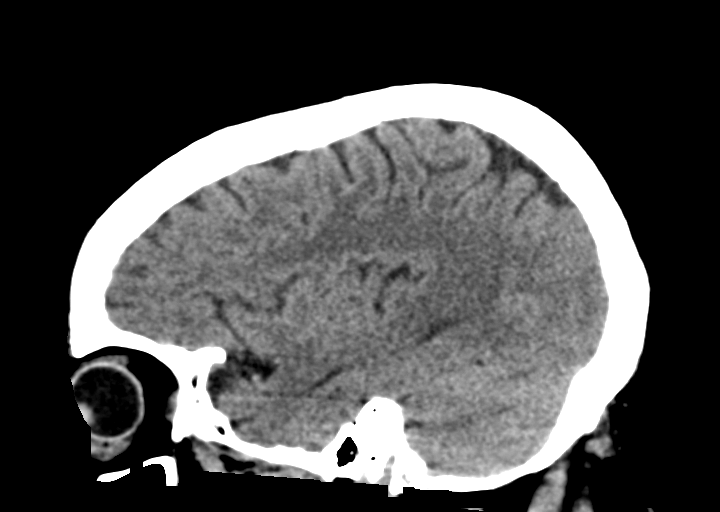
[im 30/59  brain]
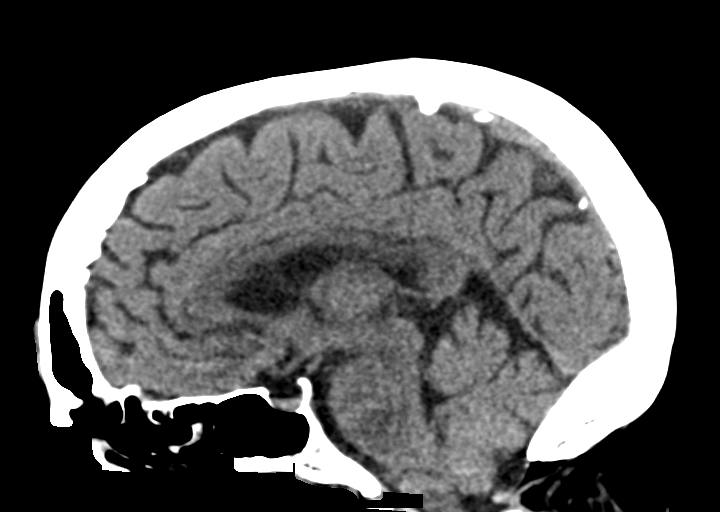
[im 39/59  brain]
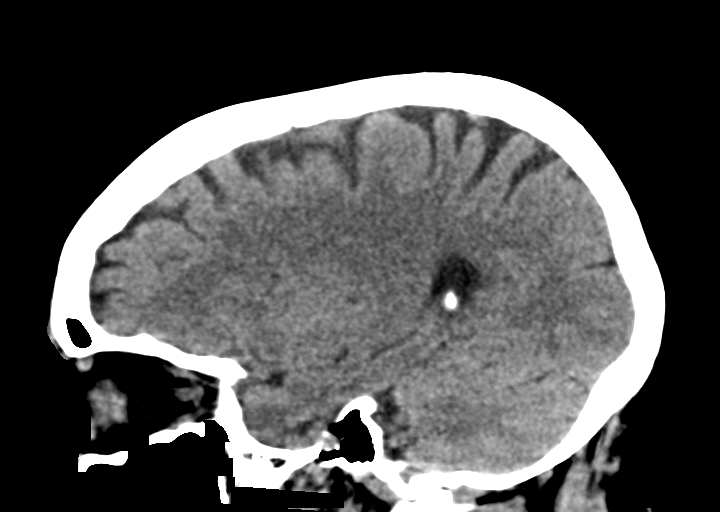

[14 of 47 positions shown; findings below may reference images not displayed]

FINDINGS: CT HEAD FINDINGS

Brain: There is mild atrophy, small vessel disease and atrophic
ventriculomegaly of the cerebral hemispheres with unremarkable
cerebellum and brainstem. There are bilateral small chronic lacunar
infarcts in the heads of the caudate nuclei. No asymmetry is seen
concerning for acute infarct, hemorrhage or mass. Basal cisterns are
clear.

Vascular:There are calcifications of the carotid siphons but no
hyperdense central vessels.

Skull: The calvarium, skull base and orbits are intact without focal
skull lesions. There is no visible scalp hematoma.

Sinuses/Orbits: No acute finding.  Mild S shaped nasal septum.

Other: None.

CT CERVICAL SPINE FINDINGS

Alignment: There is an increased now 5 mm grade 1 C2-3
anterolisthesis associated with complete worsening disc collapse at
this level with reactive endplate sclerosis and subcortical cystic
changes.

There are no further new alignment abnormalities. Narrowing and
spurring of the anterior atlantodental joint appears similar as well
as reversed lordosis centered at C4-C5.

Skull base and vertebrae: Osteopenia is noted without evidence of
compression fractures or displaced fractures. A 2 cm wide congenital
fusion defect is again noted in the posterior C1 ring. No focal or
destructive bone lesion is seen.

Soft tissues and spinal canal: No prevertebral fluid or swelling. No
visible canal hematoma. There are calcifications in both proximal
cervical ICAs.

Disc levels: The discs are diffusely degenerated and collapsed.
There are bidirectional osteophytes.

At C2-3 there is increasing narrowing of the spinal canal to 6 mm
due to the increased anterolisthesis with mild compressive effect on
the spinal cord.

There is similar ventral encroachment on the thecal sac and cord
surface due to disc osteophyte complexes at C3-4, C4-5 and C5-6.

C6-7 again demonstrates a disc osteophyte complex with more
significant spinal canal stenosis and spondylotic cord compression
with thecal sac AP narrowing to 5 mm.

Multilevel facet joint and uncinate hypertrophy are also again noted
with multilevel moderate to severe foraminal stenosis, increasingly
so at C2-3 and otherwise unchanged.

Upper chest: Negative.

Other: None.
IMPRESSION: 1. No acute intracranial CT findings or depressed skull fractures.
2. Atrophy and small-vessel disease. Chronic lacunae in the caudate
heads.
3. Osteopenia and degenerative change without evidence of cervical
fractures.
4. Increased grade 1 C2-3 spondylolisthesis, disc collapse and
reactive endplate sclerosis.
5. Resulting increased spinal canal stenosis at C2-3 and mild
compressive effect on the cord.
6. Chronic C6-7 spondylotic cord compression, and multilevel
degenerative foraminal stenosis the latter interval worsened at
C2-3.

## 2022-12-02 IMAGING — CR DG FINGER INDEX 2+V*R*
3 series · 3 of 3 positions shown · non-contrast
Comparison: None Available.

CLINICAL DATA: Fall, laceration.

EXAM:
RIGHT INDEX FINGER 2+V

[finger ap]
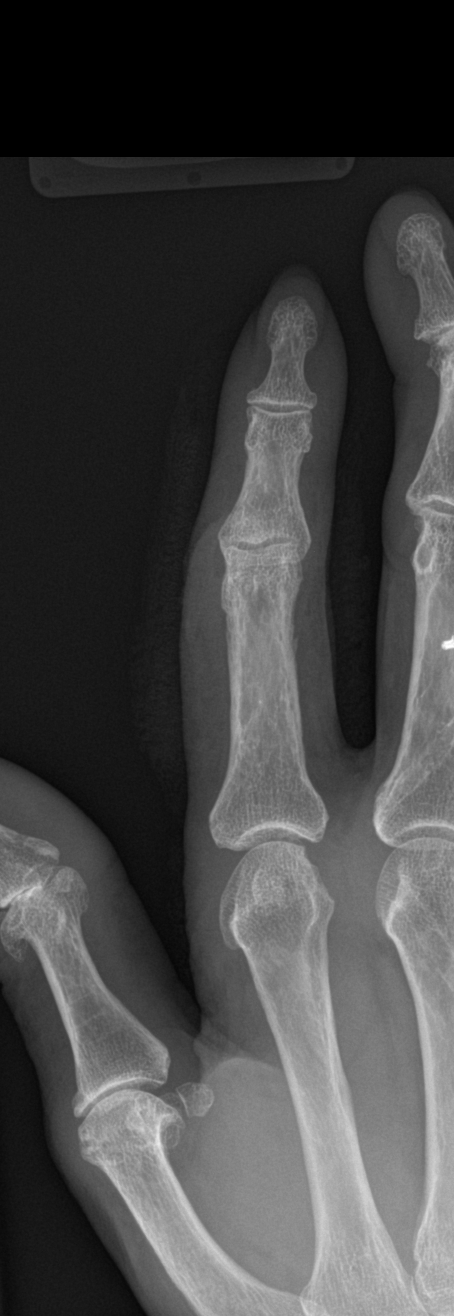

[finger obl]
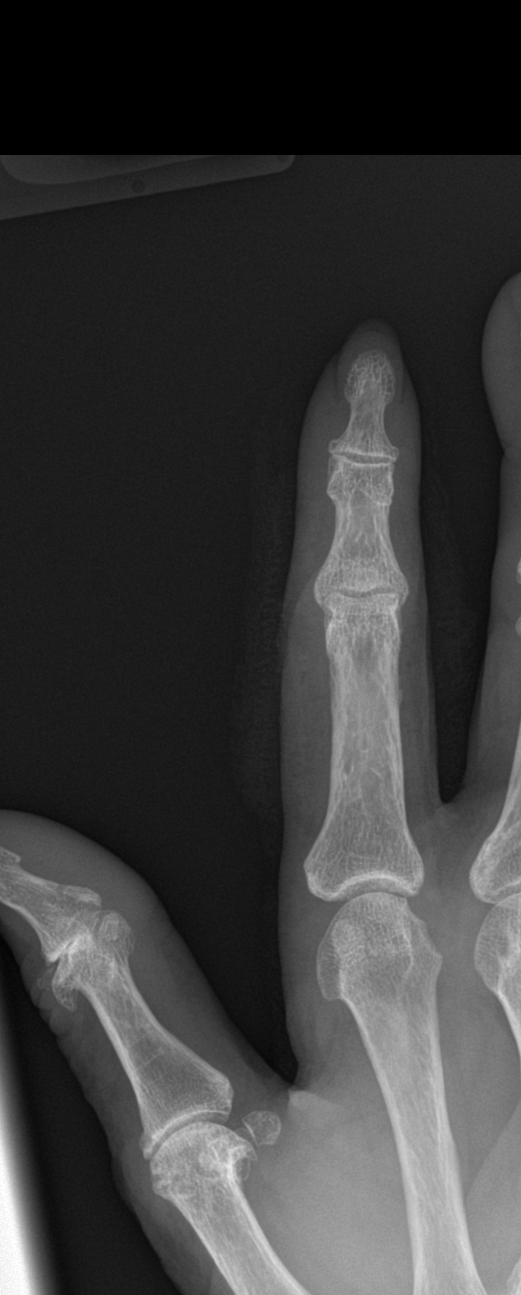

[finger lat]
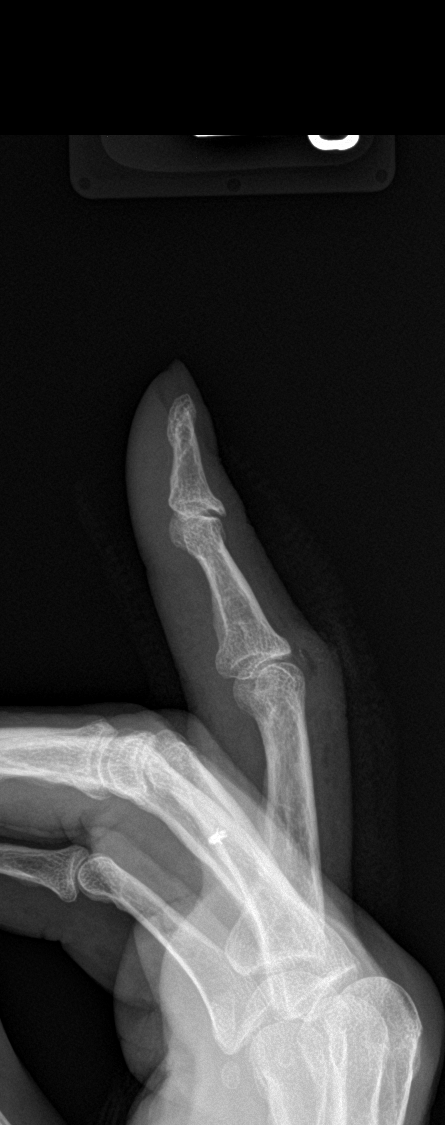

[3 of 3 positions shown; findings below may reference images not displayed]

FINDINGS: There is soft tissue swelling and air compatible with laceration
posterior to the second proximal phalanx. There are few punctate
radiopaque densities in the soft tissues posterior to the second
proximal interphalangeal joint. There is no evidence for fracture or
dislocation. Joint spaces are maintained. There are mild
degenerative changes of the first metatarsophalangeal joint and
first interphalangeal joint.
IMPRESSION: 1. Soft tissue swelling and laceration of the posterior second
finger with punctate densities worrisome for foreign bodies
posterior to the second proximal interphalangeal joint.
2. No acute fracture or dislocation.

## 2022-12-10 DIAGNOSIS — R600 Localized edema: Secondary | ICD-10-CM | POA: Diagnosis not present

## 2022-12-10 DIAGNOSIS — R109 Unspecified abdominal pain: Secondary | ICD-10-CM | POA: Diagnosis not present

## 2022-12-10 DIAGNOSIS — I739 Peripheral vascular disease, unspecified: Secondary | ICD-10-CM | POA: Diagnosis not present

## 2022-12-10 DIAGNOSIS — R413 Other amnesia: Secondary | ICD-10-CM | POA: Diagnosis not present

## 2022-12-10 DIAGNOSIS — D649 Anemia, unspecified: Secondary | ICD-10-CM | POA: Diagnosis not present

## 2022-12-10 DIAGNOSIS — K529 Noninfective gastroenteritis and colitis, unspecified: Secondary | ICD-10-CM | POA: Diagnosis not present

## 2022-12-10 DIAGNOSIS — I10 Essential (primary) hypertension: Secondary | ICD-10-CM | POA: Diagnosis not present

## 2022-12-11 ENCOUNTER — Other Ambulatory Visit: Payer: Self-pay | Admitting: Family Medicine

## 2022-12-11 ENCOUNTER — Ambulatory Visit
Admission: RE | Admit: 2022-12-11 | Discharge: 2022-12-11 | Disposition: A | Discharge status: Home / Self Care | Payer: Medicare PPO | Source: Ambulatory Visit | Attending: Family Medicine | Admitting: Family Medicine

## 2022-12-11 DIAGNOSIS — R109 Unspecified abdominal pain: Secondary | ICD-10-CM | POA: Diagnosis not present

## 2022-12-11 DIAGNOSIS — I771 Stricture of artery: Secondary | ICD-10-CM | POA: Diagnosis not present

## 2022-12-11 DIAGNOSIS — K838 Other specified diseases of biliary tract: Secondary | ICD-10-CM | POA: Diagnosis not present

## 2022-12-11 DIAGNOSIS — R1030 Lower abdominal pain, unspecified: Secondary | ICD-10-CM

## 2022-12-11 DIAGNOSIS — R197 Diarrhea, unspecified: Secondary | ICD-10-CM | POA: Diagnosis not present

## 2022-12-11 MED ORDER — IOPAMIDOL (ISOVUE-300) INJECTION 61%
100.0000 mL | Freq: Once | INTRAVENOUS | Status: AC | PRN
  Administered 2022-12-11: 100 mL via INTRAVENOUS

## 2022-12-16 ENCOUNTER — Ambulatory Visit (INDEPENDENT_AMBULATORY_CARE_PROVIDER_SITE_OTHER): Payer: Medicare PPO | Admitting: Gastroenterology

## 2022-12-16 ENCOUNTER — Encounter: Payer: Self-pay | Admitting: Gastroenterology

## 2022-12-16 VITALS — BP 118/58 | HR 67 | Ht 64.0 in | Wt 225.0 lb

## 2022-12-16 DIAGNOSIS — K838 Other specified diseases of biliary tract: Secondary | ICD-10-CM | POA: Diagnosis not present

## 2022-12-16 DIAGNOSIS — R197 Diarrhea, unspecified: Secondary | ICD-10-CM

## 2022-12-16 NOTE — Patient Instructions (Signed)
Janett Billow recommends that you complete a bowel purge (to clean out your bowels). Please do the following: Purchase a bottle of Miralax over the counter as well as a box of 5 mg dulcolax tablets. Take 4 dulcolax tablets. Wait 1 hour. You will then drink 6-8 capfuls of Miralax mixed in an adequate amount of water/juice/gatorade (you may choose which of these liquids to drink) over the next 2-3 hours. You should expect results within 1 to 6 hours after completing the bowel purge.  _______________________________________________________  If your blood pressure at your visit was 140/90 or greater, please contact your primary care physician to follow up on this.  _______________________________________________________  If you are age 84 or older, your body mass index should be between 23-30. Your Body mass index is 38.62 kg/m. If this is out of the aforementioned range listed, please consider follow up with your Primary Care Provider.  If you are age 67 or younger, your body mass index should be between 19-25. Your Body mass index is 38.62 kg/m. If this is out of the aformentioned range listed, please consider follow up with your Primary Care Provider.   ________________________________________________________  The [place] [facility] providers would like to encourage you to use [facility] to communicate with providers for non-urgent requests or questions.  Due to long hold times on the telephone, sending your provider a message by [facility] may be a faster and more efficient way to get a response.  Please allow 48 business hours for a response.  Please remember that this is for non-urgent requests.  _______________________________________________________

## 2022-12-16 NOTE — Progress Notes (Signed)
12/16/2022 BAYLYN SICKLES [id number] 04/12/1941   HISTORY OF PRESENT ILLNESS: This is a 82 year old female who is a patient of Dr. Lynne Leader.  She is here today with complaints of diarrhea.  She says that she been having diarrhea for the past 2 to 3 months.  Says that she has a BM anywhere from 7 to 8 to 9 times a day.  Has abdominal discomfort across her lower abdomen, mostly on the left side.  Says it is very noisy.  No blood in her stool.  Says that her appetite is fine.  Weight is actually up.  Tells me she not had any recent antibiotic use or travel.  None of her medications are new.  Tells me that she had labs and stool studies performed by [facility] primary care.  She had a CT scan of the abdomen and pelvis with contrast performed just 5 days ago that showed moderate stool burden.  It also showed intra and extrahepatic biliary dilatation.  Will await results of labs.  May need further imaging.  Of note, it looks like she was seen here for issues with diarrhea back in May 2019.  Her last colonoscopy was in November 2014 at which time she had a couple of small polyps removed that were hyperplastic on pathology so she is given a 10-year colonoscopy recall.   Past Medical History:  Diagnosis Date   Allergic rhinitis    Arnold-Chiari malformation (HCC)    Asthma    CAD (coronary artery disease)    COPD (chronic obstructive pulmonary disease) (HCC)    Coughing    coughing since last 01-22-2019 , started on cefdinir bid x10 days, has 3 pills left today , reports she feels mcuh better , still coughing copiuus amonts of thick white sputum , denies fever nor chills, nor body aches .    DJD (degenerative joint disease)    GERD (gastroesophageal reflux disease)    History of absence seizures    last confirmed seizure age 65     Hx of colonic polyps    Hypercholesteremia    Hypertension    Obesity    Positive PPD    Reflex sympathetic dystrophy    Sleep apnea    Varicose veins    Venous  insufficiency    Past Surgical History:  Procedure Laterality Date   ABDOMINAL HYSTERECTOMY     cspine surgery  1993   for arnold-chiari malformation   JOINT REPLACEMENT  06/05/11   Left total knee replacement   right knee arthroscopy  12/2007   Dr. Tonita Cong   right total knee replacement  05/2008   Dr. Tonita Cong   TOTAL KNEE REVISION Left 02/03/2019   Procedure: LEFT TOTAL KNEE REVISION;  Surgeon: Rod Can, MD;  Location: [facility];  Service: Orthopedics;  Laterality: Left;    reports that she quit smoking about 10 years ago. Her smoking use included cigarettes. She has a 3.60 pack-year smoking history. She has never used smokeless tobacco. She reports that she does not drink alcohol and does not use drugs. family history includes Clotting disorder in her father; Heart disease in her mother; Stroke in her father and mother. No Known Allergies    Outpatient Encounter Medications as of 12/16/2022  Medication Sig   albuterol (PROVENTIL HFA;VENTOLIN HFA) 108 (90 BASE) MCG/ACT inhaler Inhale 2 puffs into the lungs every 4 (four) hours as needed. For wheeze or shortness of breath   alendronate (FOSAMAX) 70 MG tablet TAKE 1 TABLET  BY MOUTH ONCE WEEKLY BEFORE THE FIRST FOOD, BEVERAGE OR MEDICINE OF THE DAY WITH PLAIN WATER   aspirin 81 MG tablet Take 81 mg by mouth daily.   azelastine (ASTELIN) 0.1 % nasal spray Place 1 spray into both nostrils daily as needed for rhinitis or allergies.    budesonide-formoterol (SYMBICORT) 160-4.5 MCG/ACT inhaler    cephALEXin (KEFLEX) 500 MG capsule Take 1 capsule (500 mg total) by mouth 3 (three) times daily.   citalopram (CELEXA) 10 MG tablet Take 1 tablet (10 mg total) by mouth daily.   [employer] D3 50 MCG (2000 UT) CAPS Take 1 capsule by mouth daily.   diclofenac Sodium (VOLTAREN) 1 % GEL Voltaren Arthritis Pain 1 % topical gel  APPLY 2 GRAMS TO THE AFFECTED AREA(S) BY TOPICAL ROUTE 4 TIMES PER DAY   donepezil (ARICEPT ODT) 5 MG disintegrating tablet Take 1 tablet (5  mg total) by mouth at bedtime.   EPINEPHrine 0.3 mg/0.3 mL IJ SOAJ injection Inject 0.3 mg into the muscle as needed for anaphylaxis.   famotidine (PEPCID) 20 MG tablet TAKE 1 TABLET BY MOUTH TWICE A DAY   ferrous sulfate 325 (65 FE) MG EC tablet Take 325 mg by mouth daily.   fluticasone (FLONASE) 50 MCG/ACT nasal spray Place 1 spray into both nostrils 2 (two) times daily as needed for allergies or rhinitis.   furosemide (LASIX) 40 MG tablet TAKE 2 TABLETS BY MOUTH EVERY DAY NEED OFFICE VISIT   HYDROcodone-acetaminophen (NORCO/VICODIN) 5-325 MG tablet Take 1 tablet by mouth every 6 (six) hours as needed for severe pain. (Patient not taking: Reported on 12/16/2022)   lidocaine (LIDODERM) 5 % Place 1 patch onto the skin daily. Remove & Discard patch within 12 hours or as directed by MD (Patient not taking: Reported on 12/16/2022)   meloxicam (MOBIC) 15 MG tablet TAKE 1 TABLET BY MOUTH EVERY DAY AS NEEDED FOR PAIN   montelukast (SINGULAIR) 10 MG tablet TAKE 1 TABLET BY MOUTH EVERY DAY IN THE EVENING   oxyCODONE-acetaminophen (PERCOCET) 10-325 MG tablet Take 1 tablet by mouth 3 (three) times daily as needed for pain.   pantoprazole (PROTONIX) 40 MG tablet TAKE 1 TABLET BY MOUTH EVERY DAY   potassium chloride SA (KLOR-CON M20) 20 MEQ tablet Take 1 tablet (20 mEq total) by mouth 2 (two) times daily.   rosuvastatin (CRESTOR) 40 MG tablet Take 1 tablet (40 mg total) by mouth daily.   Sodium Chloride-Sodium Bicarb (SINUS [place] SQUEEZE BOTTLE) 2300-700 MG KIT See admin instructions.   Tiotropium Bromide Monohydrate (SPIRIVA RESPIMAT) 1.25 MCG/ACT AERS Inhale 1.25 mcg into the lungs daily.   tiZANidine (ZANAFLEX) 2 MG tablet Take 1 tablet (2 mg total) by mouth every 6 (six) hours as needed for muscle spasms.   traMADol (ULTRAM) 50 MG tablet TAKE 1 TABLET BY MOUTH EVERY 6 HOURS AS NEEDED.   traZODone (DESYREL) 100 MG tablet TAKE 1 TABLET BY MOUTH EVERYDAY AT BEDTIME   No facility-administered encounter  medications on file as of 12/16/2022.     REVIEW OF SYSTEMS  : All other systems reviewed and negative except where noted in the History of Present Illness.   PHYSICAL EXAM: BP (!) 118/58   Pulse 67   Ht '5\' 4"'$  (1.626 m)   Wt 225 lb (102.1 kg)   BMI 38.62 kg/m  General: Well developed female in no acute distress Head: Normocephalic and atraumatic Eyes:  Sclerae anicteric, conjunctiva pink. Ears: Normal auditory acuity Lungs: Clear throughout to auscultation; no W/R/R. Heart:  Regular rate and rhythm; no M/R/G. Abdomen: Soft, non-distended.  BS present.  Non-tender. Musculoskeletal: Symmetrical with no gross deformities  Skin: No lesions on visible extremities Extremities: No edema  Neurological: Alert oriented x 4, grossly non-focal Psychological:  Alert and cooperative. Normal mood and affect  ASSESSMENT AND PLAN: *Diarrhea: She describes having diarrhea 7, 8, 9 times a day for the past 2 to 3 months.  Her CT scan just 5 days ago showed moderate stool throughout the colon.  I wonder if she is actually having more of a constipation issue and then having some overflow diarrhea.  She is on a lot of constipating meds, but denies any significant constipation leading up to this.  She reports having stool studies and labs at her PCPs office at [facility].  We will request those and review those.  I would like her to do MiraLAX bowel purge this evening and then may need to begin doing a dose of MiraLAX daily to keep things moving well from thereon out.  I will have my nurse reach out to her on Thursday hopefully by which time I can reviewed her labs and stool study results and we can get an update from her. *CT scan recently showing intra and extrahepatic biliary dilatation:  Will await results of her recent labs.  May need further imaging.   CC:  Biagio Borg, MD

## 2022-12-17 ENCOUNTER — Inpatient Hospital Stay: Payer: Medicare PPO | Admitting: Hematology and Oncology

## 2022-12-17 ENCOUNTER — Inpatient Hospital Stay: Payer: Medicare PPO

## 2022-12-17 NOTE — Progress Notes (Deleted)
[facility] NOTE  Patient Care Team: Biagio Borg, MD as PCP - General (Internal Medicine) [name] [name], MD as PCP - Cardiology (Cardiology) Susa Day, MD (Orthopedic Surgery) [name] [name], MD (Cardiology)  CHIEF COMPLAINTS/PURPOSE OF CONSULTATION:  Anemia  ASSESSMENT & PLAN:  No problem-specific Assessment & Plan notes found for this encounter.  No orders of the defined types were placed in this encounter.    HISTORY OF PRESENTING ILLNESS:  [name] 82 y.o. female is here because of normocytic normochromic anemia and leukopenia.  REVIEW OF SYSTEMS:   Constitutional: Denies fevers, chills or abnormal night sweats Eyes: Denies blurriness of vision, double vision or watery eyes Ears, nose, mouth, throat, and face: Denies mucositis or sore throat Respiratory: Denies cough, dyspnea or wheezes Cardiovascular: Denies palpitation, chest discomfort or lower extremity swelling Gastrointestinal:  Denies nausea, heartburn or change in bowel habits Skin: Denies abnormal skin rashes Lymphatics: Denies new lymphadenopathy or easy bruising Neurological:Denies numbness, tingling or new weaknesses Behavioral/Psych: Mood is stable, no new changes  All other systems were reviewed with the patient and are negative.  MEDICAL HISTORY:  Past Medical History:  Diagnosis Date   Allergic rhinitis    Arnold-Chiari malformation (HCC)    Asthma    CAD (coronary artery disease)    COPD (chronic obstructive pulmonary disease) (HCC)    Coughing    coughing since last 01-22-2019 , started on cefdinir bid x10 days, has 3 pills left today , reports she feels mcuh better , still coughing copiuus amonts of thick white sputum , denies fever nor chills, nor body aches .    DJD (degenerative joint disease)    GERD (gastroesophageal reflux disease)    History of absence seizures    last confirmed seizure age 32     Hx of colonic polyps    Hypercholesteremia     Hypertension    Obesity    Positive PPD    Reflex sympathetic dystrophy    Sleep apnea    Varicose veins    Venous insufficiency     SURGICAL HISTORY: Past Surgical History:  Procedure Laterality Date   ABDOMINAL HYSTERECTOMY     cspine surgery  1993   for arnold-chiari malformation   JOINT REPLACEMENT  06/05/11   Left total knee replacement   right knee arthroscopy  12/2007   Dr. Tonita Cong   right total knee replacement  05/2008   Dr. Tonita Cong   TOTAL KNEE REVISION Left 02/03/2019   Procedure: LEFT TOTAL KNEE REVISION;  Surgeon: Rod Can, MD;  Location: [facility];  Service: Orthopedics;  Laterality: Left;    SOCIAL HISTORY: Social History   Socioeconomic History   Marital status: Widowed    Spouse name: Not on file   Number of children: 2   Years of education: 86   Highest education level: Not on file  Occupational History   Occupation: retired  Tobacco Use   Smoking status: Former    Packs/day: 0.30    Years: 12.00    Total pack years: 3.60    Types: Cigarettes    Quit date: 01/02/2012    Years since quitting: 10.9   Smokeless tobacco: Never   Tobacco comments:    1 pack per week  Vaping Use   Vaping Use: Never used  Substance and Sexual Activity   Alcohol use: No   Drug use: No   Sexual activity: Not on file  Other Topics Concern   Not on file  Social History Narrative   Fun/Hobby: Playing cards and mingling.   Denies abuse and feels safe at home.    Social Determinants of Health   Financial Resource Strain: Not on file  Food Insecurity: Not on file  Transportation Needs: Not on file  Physical Activity: Not on file  Stress: Not on file  Social Connections: Not on file  Intimate Partner Violence: Not on file    FAMILY HISTORY: Family History  Problem Relation Age of Onset   Heart disease Mother    Stroke Mother    Stroke Father    Clotting disorder Father    Colon cancer Neg Hx    Stomach cancer Neg Hx     ALLERGIES:  has No Known  Allergies.  MEDICATIONS:  Current Outpatient Medications  Medication Sig Dispense Refill   albuterol (PROVENTIL HFA;VENTOLIN HFA) 108 (90 BASE) MCG/ACT inhaler Inhale 2 puffs into the lungs every 4 (four) hours as needed. For wheeze or shortness of breath 1 Inhaler 6   alendronate (FOSAMAX) 70 MG tablet TAKE 1 TABLET BY MOUTH ONCE WEEKLY BEFORE THE FIRST FOOD, BEVERAGE OR MEDICINE OF THE DAY WITH PLAIN WATER 4 tablet 1   aspirin 81 MG tablet Take 81 mg by mouth daily.     azelastine (ASTELIN) 0.1 % nasal spray Place 1 spray into both nostrils daily as needed for rhinitis or allergies.      budesonide-formoterol (SYMBICORT) 160-4.5 MCG/ACT inhaler      cephALEXin (KEFLEX) 500 MG capsule Take 1 capsule (500 mg total) by mouth 3 (three) times daily. 30 capsule 0   citalopram (CELEXA) 10 MG tablet Take 1 tablet (10 mg total) by mouth daily. 90 tablet 3   [employer] D3 50 MCG (2000 UT) CAPS Take 1 capsule by mouth daily.     diclofenac Sodium (VOLTAREN) 1 % GEL Voltaren Arthritis Pain 1 % topical gel  APPLY 2 GRAMS TO THE AFFECTED AREA(S) BY TOPICAL ROUTE 4 TIMES PER DAY     donepezil (ARICEPT ODT) 5 MG disintegrating tablet Take 1 tablet (5 mg total) by mouth at bedtime. 90 tablet 3   EPINEPHrine 0.3 mg/0.3 mL IJ SOAJ injection Inject 0.3 mg into the muscle as needed for anaphylaxis.     famotidine (PEPCID) 20 MG tablet TAKE 1 TABLET BY MOUTH TWICE A DAY 180 tablet 1   ferrous sulfate 325 (65 FE) MG EC tablet Take 325 mg by mouth daily.     fluticasone (FLONASE) 50 MCG/ACT nasal spray Place 1 spray into both nostrils 2 (two) times daily as needed for allergies or rhinitis.     furosemide (LASIX) 40 MG tablet TAKE 2 TABLETS BY MOUTH EVERY DAY NEED OFFICE VISIT 180 tablet 2   HYDROcodone-acetaminophen (NORCO/VICODIN) 5-325 MG tablet Take 1 tablet by mouth every 6 (six) hours as needed for severe pain. (Patient not taking: Reported on 12/16/2022) 10 tablet 0   lidocaine (LIDODERM) 5 % Place 1 patch onto the  skin daily. Remove & Discard patch within 12 hours or as directed by MD (Patient not taking: Reported on 12/16/2022) 7 patch 0   meloxicam (MOBIC) 15 MG tablet TAKE 1 TABLET BY MOUTH EVERY DAY AS NEEDED FOR PAIN 30 tablet 5   montelukast (SINGULAIR) 10 MG tablet TAKE 1 TABLET BY MOUTH EVERY DAY IN THE EVENING 90 tablet 1   oxyCODONE-acetaminophen (PERCOCET) 10-325 MG tablet Take 1 tablet by mouth 3 (three) times daily as needed for pain.     pantoprazole (PROTONIX) 40 MG tablet  TAKE 1 TABLET BY MOUTH EVERY DAY 90 tablet 1   potassium chloride SA (KLOR-CON M20) 20 MEQ tablet Take 1 tablet (20 mEq total) by mouth 2 (two) times daily. 180 tablet 3   rosuvastatin (CRESTOR) 40 MG tablet Take 1 tablet (40 mg total) by mouth daily. 90 tablet 3   Sodium Chloride-Sodium Bicarb (SINUS [place] SQUEEZE BOTTLE) 2300-700 MG KIT See admin instructions.     Tiotropium Bromide Monohydrate (SPIRIVA RESPIMAT) 1.25 MCG/ACT AERS Inhale 1.25 mcg into the lungs daily.     tiZANidine (ZANAFLEX) 2 MG tablet Take 1 tablet (2 mg total) by mouth every 6 (six) hours as needed for muscle spasms. 40 tablet 1   traMADol (ULTRAM) 50 MG tablet TAKE 1 TABLET BY MOUTH EVERY 6 HOURS AS NEEDED. 60 tablet 1   traZODone (DESYREL) 100 MG tablet TAKE 1 TABLET BY MOUTH EVERYDAY AT BEDTIME 90 tablet 1   No current facility-administered medications for this visit.     PHYSICAL EXAMINATION: ECOG PERFORMANCE STATUS: 0 - Asymptomatic  There were no vitals filed for this visit. There were no vitals filed for this visit.  GENERAL:alert, no distress and comfortable SKIN: skin color, texture, turgor are normal, no rashes or significant lesions EYES: normal, conjunctiva are pink and non-injected, sclera clear OROPHARYNX:no exudate, no erythema and lips, buccal mucosa, and tongue normal  NECK: supple, thyroid normal size, non-tender, without nodularity LYMPH:  no palpable lymphadenopathy in the cervical, axillary or inguinal LUNGS: clear to  auscultation and percussion with normal breathing effort HEART: regular rate & rhythm and no murmurs and no lower extremity edema ABDOMEN:abdomen soft, non-tender and normal bowel sounds Musculoskeletal:no cyanosis of digits and no clubbing  PSYCH: alert & oriented x 3 with fluent speech NEURO: no focal motor/sensory deficits  LABORATORY DATA:  I have reviewed the data as listed Lab Results  Component Value Date   WBC 2.5 (L) 06/18/2022   HGB 11.3 (L) 06/18/2022   HCT 35.5 (L) 06/18/2022   MCV 83.5 06/18/2022   PLT 147.0 (L) 06/18/2022     Chemistry      Component Value Date/Time   NA 140 06/18/2022 1143   NA 142 11/07/2019 1605   K 4.5 06/18/2022 1143   CL 103 06/18/2022 1143   CO2 30 06/18/2022 1143   BUN 12 06/18/2022 1143   BUN 15 11/07/2019 1605   CREATININE 0.84 06/18/2022 1143      Component Value Date/Time   CALCIUM 9.1 06/18/2022 1143   ALKPHOS 67 06/18/2022 1143   AST 17 06/18/2022 1143   ALT 9 06/18/2022 1143   BILITOT 0.4 06/18/2022 1143   BILITOT 0.4 11/07/2019 1605       RADIOGRAPHIC STUDIES: I have personally reviewed the radiological images as listed and agreed with the findings in the report. CT ABDOMEN PELVIS W CONTRAST  Result Date: 12/11/2022 CLINICAL DATA:  Left-sided abdominal pain and diarrhea. EXAM: CT ABDOMEN AND PELVIS WITH CONTRAST TECHNIQUE: Multidetector CT imaging of the abdomen and pelvis was performed using the standard protocol following bolus administration of intravenous contrast. RADIATION DOSE REDUCTION: This exam was performed according to the departmental dose-optimization program which includes automated exposure control, adjustment of the mA and/or kV according to patient size and/or use of iterative reconstruction technique. CONTRAST:  191m ISOVUE-300 IOPAMIDOL (ISOVUE-300) INJECTION 61% COMPARISON:  Remote study from 2006 FINDINGS: Lower chest: No acute pulmonary findings at the lung bases. The heart is mildly enlarged. No  pericardial effusion. There is a moderate tortuosity of the  lower thoracic aorta. Hepatobiliary: No worrisome hepatic lesions are identified. Mild central intrahepatic biliary dilatation and moderate common bile duct dilatation. The common bile duct measures a maximum 13 mm in the porta hepatis it does taper in the head of the pancreas. Recommend correlation with liver function studies. Right upper quadrant ultrasound may be helpful for further evaluation also. I do not see any evidence of acute cholecystitis. Pancreas: No mass, inflammation or ductal dilatation. Spleen: Normal size. No focal lesions. Adrenals/Urinary Tract: Adrenal glands are normal. No worrisome renal lesions or hydronephrosis. The bladder is unremarkable. No collecting system abnormalities are identified on the delayed images. Stomach/Bowel: The stomach, duodenum, small and colon unremarkable. No acute inflammatory process, mass lesions or obstructive findings. The terminal ileum is normal. Suspect prior appendectomy. Moderate stool burden. Vascular/Lymphatic: Moderate tortuosity and calcification of the abdominal aorta but no aneurysm or dissection. The branch vessels are patent. The major venous structures are patent. Small scattered mesenteric and retroperitoneal lymph nodes but no mass or adenopathy. Reproductive: Surgically absent. Other: No pelvic mass or adenopathy. No free pelvic fluid collections. No inguinal mass or adenopathy. No abdominal wall hernia or subcutaneous lesions. Musculoskeletal: No significant bony findings. Age related degenerative changes involving the spine. 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 IMPRESSION: 1. No acute abdominal/pelvic findings, mass lesions or lymphadenopathy. 2. Intra and extrahepatic biliary dilatation. Recommend correlation with liver function studies. Right upper  quadrant ultrasound may be helpful for further evaluation. 3. Moderate stool burden. Aortic Atherosclerosis (ICD10-I70.0). Electronically Signed   By: Marijo Sanes M.D.   On: 12/11/2022 12:17    All questions were answered. The patient knows to call the clinic with any problems, questions or concerns. I spent 45 minutes in the care of this patient including H and P, review of records, counseling and coordination of care.     Benay Pike, MD 12/17/2022 7:38 AM

## 2022-12-18 ENCOUNTER — Telehealth (HOSPITAL_COMMUNITY): Payer: Self-pay

## 2022-12-18 ENCOUNTER — Telehealth: Payer: Self-pay

## 2022-12-18 DIAGNOSIS — K838 Other specified diseases of biliary tract: Secondary | ICD-10-CM

## 2022-12-18 DIAGNOSIS — R1010 Upper abdominal pain, unspecified: Secondary | ICD-10-CM

## 2022-12-18 DIAGNOSIS — J3089 Other allergic rhinitis: Secondary | ICD-10-CM | POA: Diagnosis not present

## 2022-12-18 DIAGNOSIS — R9389 Abnormal findings on diagnostic imaging of other specified body structures: Secondary | ICD-10-CM

## 2022-12-18 NOTE — Telephone Encounter (Signed)
Left message on machine to call back  

## 2022-12-18 NOTE — Telephone Encounter (Signed)
[name] is it ok to call the pt today.  You wanted me to touch base with you before I call the pt

## 2022-12-18 NOTE — Telephone Encounter (Signed)
-----  Message from Timothy Lasso, RN sent at 12/16/2022  1:47 PM EST -----  ----- Message ----- From: Loralie Champagne, PA-C Sent: 12/16/2022   1:32 PM EST To: Timothy Lasso, RN  Can you please sent a reminder to yourself to reach out to this patient and get an update from her on Thursday?  She is supposed to do a MiraLAX bowel purge this evening.  I am waiting on stool study results and lab results to review from [facility].  Please touch base with me first before contacting her though to see if I have received those results.  Thank you!  Jess

## 2022-12-18 NOTE — Telephone Encounter (Signed)
Records printed from [facility] and placed on your desk for review.

## 2022-12-19 DIAGNOSIS — R1010 Upper abdominal pain, unspecified: Secondary | ICD-10-CM | POA: Insufficient documentation

## 2022-12-19 DIAGNOSIS — R9389 Abnormal findings on diagnostic imaging of other specified body structures: Secondary | ICD-10-CM | POA: Insufficient documentation

## 2022-12-19 NOTE — Telephone Encounter (Signed)
The pt has been advised of the recommendation per Janett Billow after review of records.    She states the miralax purge worked very well and she will continue miralax daily.    She agrees to the [place].  Order entered and sent to the schedulers.    She does report continued abd pain above the belly button and slightly to the left.  She also reports black stools.

## 2022-12-20 ENCOUNTER — Encounter: Payer: Self-pay | Admitting: Hematology and Oncology

## 2022-12-20 ENCOUNTER — Inpatient Hospital Stay: Payer: Medicare PPO

## 2022-12-20 ENCOUNTER — Inpatient Hospital Stay: Payer: Medicare PPO | Attending: Hematology and Oncology | Admitting: Hematology and Oncology

## 2022-12-20 VITALS — BP 143/66 | HR 53 | Temp 97.7°F | Resp 18 | Ht 64.0 in | Wt 223.1 lb

## 2022-12-20 DIAGNOSIS — I2510 Atherosclerotic heart disease of native coronary artery without angina pectoris: Secondary | ICD-10-CM

## 2022-12-20 DIAGNOSIS — D72819 Decreased white blood cell count, unspecified: Secondary | ICD-10-CM | POA: Diagnosis not present

## 2022-12-20 DIAGNOSIS — I10 Essential (primary) hypertension: Secondary | ICD-10-CM

## 2022-12-20 DIAGNOSIS — R109 Unspecified abdominal pain: Secondary | ICD-10-CM | POA: Insufficient documentation

## 2022-12-20 DIAGNOSIS — I739 Peripheral vascular disease, unspecified: Secondary | ICD-10-CM | POA: Insufficient documentation

## 2022-12-20 DIAGNOSIS — D649 Anemia, unspecified: Secondary | ICD-10-CM

## 2022-12-20 DIAGNOSIS — J4489 Other specified chronic obstructive pulmonary disease: Secondary | ICD-10-CM

## 2022-12-20 DIAGNOSIS — K838 Other specified diseases of biliary tract: Secondary | ICD-10-CM | POA: Insufficient documentation

## 2022-12-20 DIAGNOSIS — R197 Diarrhea, unspecified: Secondary | ICD-10-CM | POA: Insufficient documentation

## 2022-12-20 DIAGNOSIS — Z79899 Other long term (current) drug therapy: Secondary | ICD-10-CM | POA: Insufficient documentation

## 2022-12-20 LAB — CBC WITH DIFFERENTIAL/PLATELET
Abs Immature Granulocytes: 0.01 10*3/uL (ref 0.00–0.07)
Basophils Absolute: 0 10*3/uL (ref 0.0–0.1)
Basophils Relative: 0 %
Eosinophils Absolute: 0.1 10*3/uL (ref 0.0–0.5)
Eosinophils Relative: 3 %
HCT: 35 % — ABNORMAL LOW (ref 36.0–46.0)
Hemoglobin: 10.9 g/dL — ABNORMAL LOW (ref 12.0–15.0)
Immature Granulocytes: 0 %
Lymphocytes Relative: 33 %
Lymphs Abs: 0.9 10*3/uL (ref 0.7–4.0)
MCH: 26.8 pg (ref 26.0–34.0)
MCHC: 31.1 g/dL (ref 30.0–36.0)
MCV: 86 fL (ref 80.0–100.0)
Monocytes Absolute: 0.2 10*3/uL (ref 0.1–1.0)
Monocytes Relative: 8 %
Neutro Abs: 1.5 10*3/uL — ABNORMAL LOW (ref 1.7–7.7)
Neutrophils Relative %: 56 %
Platelets: 174 10*3/uL (ref 150–400)
RBC: 4.07 MIL/uL (ref 3.87–5.11)
RDW: 14 % (ref 11.5–15.5)
WBC: 2.7 10*3/uL — ABNORMAL LOW (ref 4.0–10.5)
nRBC: 0 % (ref 0.0–0.2)

## 2022-12-20 LAB — CMP (CANCER CENTER ONLY)
ALT: 7 U/L (ref 0–44)
AST: 15 U/L (ref 15–41)
Albumin: 3.6 g/dL (ref 3.5–5.0)
Alkaline Phosphatase: 62 U/L (ref 38–126)
Anion gap: 5 (ref 5–15)
BUN: 8 mg/dL (ref 8–23)
CO2: 27 mmol/L (ref 22–32)
Calcium: 9.1 mg/dL (ref 8.9–10.3)
Chloride: 107 mmol/L (ref 98–111)
Creatinine: 0.79 mg/dL (ref 0.44–1.00)
GFR, Estimated: >60 mL/min (ref >60)
Glucose, Bld: 77 mg/dL (ref 70–99)
Potassium: 3.9 mmol/L (ref 3.5–5.1)
Sodium: 139 mmol/L (ref 135–145)
Total Bilirubin: 0.4 mg/dL (ref 0.3–1.2)
Total Protein: 6.6 g/dL (ref 6.5–8.1)

## 2022-12-20 LAB — IRON AND IRON BINDING CAPACITY (CC-WL,HP ONLY)
Iron: 56 ug/dL (ref 28–170)
Saturation Ratios: 18 % (ref 10.4–31.8)
TIBC: 312 ug/dL (ref 250–450)
UIBC: 256 ug/dL (ref 148–442)

## 2022-12-20 LAB — TECHNOLOGIST SMEAR REVIEW: Plt Morphology: ADEQUATE

## 2022-12-20 LAB — HEPATITIS PANEL, ACUTE
HCV Ab: NONREACTIVE
Hep A IgM: NONREACTIVE
Hep B C IgM: NONREACTIVE
Hepatitis B Surface Ag: NONREACTIVE

## 2022-12-20 LAB — TSH: TSH: 2.113 u[IU]/mL (ref 0.350–4.500)

## 2022-12-20 LAB — FERRITIN: Ferritin: 117 ng/mL (ref 11–307)

## 2022-12-20 LAB — VITAMIN B12: Vitamin B-12: 194 pg/mL (ref 180–914)

## 2022-12-20 NOTE — Progress Notes (Signed)
[facility] NOTE  Patient Care Team: Biagio Borg, MD as PCP - General (Internal Medicine) [name] [name], MD as PCP - Cardiology (Cardiology) Susa Day, MD (Orthopedic Surgery) Lelon Perla, MD (Cardiology)  CHIEF COMPLAINTS/PURPOSE OF CONSULTATION:  Leukopenia and anemia  ASSESSMENT & PLAN:   This is a very pleasant 82 year old female patient with past medical history significant for hypertension, dyslipidemia, coronary artery disease, peripheral vascular disease referred to hematology for evaluation and recommendations regarding leukopenia and normocytic normochromic anemia  #1 leukopenia, mild and progressive.  I have reviewed labs for the past couple years and she has had mild leukopenia and neutropenia for the past couple years which has been slowly progressive.  She does not have any history of recurrent infections or hospitalizations or antibiotic use.  We have discussed the common causes of leukopenia including but not limited to nutritional deficiencies, autoimmune diseases, medications, bone marrow disorders etc.  Will repeat CBC, iron panel, ferritin, O67, folic acid levels, ANA and TSH today.   She understands that myelodysplasia could be a potential etiology for this leukopenia normocytic normochromic anemia.  #2 normocytic normochromic anemia, on iron oral iron supplementation.  Her last ferritin was 132, not consistent with iron deficiency.  However her anemia has overall remained mild and stable.  I do not believe her current symptoms of diarrhea are related to her hematological issues.  Once again I wonder if she has myelodysplasia given her age and progressive mild cytopenias.  She however is not a candidate for aggressive treatment for myelodysplasia.   I would recommend surveillance and consider bone marrow aspiration biopsy if there is significant change in the cytopenias.  She is agreeable to this plan.  Return to clinic in a couple weeks  to review lab results from today and to discuss any additional recommendations.  Orders Placed This Encounter  Procedures   CBC with Differential/Platelet    Standing Status:   Standing    Number of Occurrences:   22    Standing Expiration Date:   12/21/2023   Iron and Iron Binding Capacity ([facility] only)    Standing Status:   Future    Standing Expiration Date:   12/21/2023   Ferritin    Standing Status:   Future    Standing Expiration Date:   12/20/2023   Vitamin B12    Standing Status:   Future    Standing Expiration Date:   12/20/2023   Folate RBC    Standing Status:   Future    Standing Expiration Date:   12/21/2023   Hepatitis panel, acute    Standing Status:   Future    Standing Expiration Date:   12/20/2023   TSH    Standing Status:   Standing    Number of Occurrences:   22    Standing Expiration Date:   12/21/2023   ANA, IFA (with reflex)    Standing Status:   Future    Standing Expiration Date:   12/20/2023   CMP ([facility] only)    Standing Status:   Future    Standing Expiration Date:   12/21/2023   Technologist smear review    Order Specific Question:   Clinical information:    Answer:   leukopenia and anemia     HISTORY OF PRESENTING ILLNESS:  [name] 82 y.o. female is here because of Leukopenia and Anemia.  This is a very pleasant 82 year old female patient with past medical history significant for  coronary artery disease, COPD, hypertension, dyslipidemia peripheral vascular disease referred to hematology for progressive leukopenia and anemia.  Patient also has started noticing severe diarrhea for the past 2 to 3 months.  She describes it as watery bowel movements which happened around 8-9 times a day, disabling.  She denies any prior infections before this diarrhea.  Diarrhea persist despite eating or not.   She denies any fevers, drenching night sweats, loss of appetite or loss of weight.  No change in breathing.  No change in urinary habits or new  neurological complaints.  No new medications.  No recent infections or antibiotic use.  She takes oral iron supplementation.  Rest of the pertinent 10 point ROS reviewed and negative  REVIEW OF SYSTEMS:   Constitutional: Denies fevers, chills or abnormal night sweats Eyes: Denies blurriness of vision, double vision or watery eyes Ears, nose, mouth, throat, and face: Denies mucositis or sore throat Respiratory: Denies cough, dyspnea or wheezes Cardiovascular: Denies palpitation, chest discomfort or lower extremity swelling Gastrointestinal:  Denies nausea, heartburn or change in bowel habits Skin: Denies abnormal skin rashes Lymphatics: Denies new lymphadenopathy or easy bruising Neurological:Denies numbness, tingling or new weaknesses Behavioral/Psych: Mood is stable, no new changes  All other systems were reviewed with the patient and are negative.  MEDICAL HISTORY:  Past Medical History:  Diagnosis Date   Allergic rhinitis    Arnold-Chiari malformation (HCC)    Asthma    CAD (coronary artery disease)    COPD (chronic obstructive pulmonary disease) (HCC)    Coughing    coughing since last 01-22-2019 , started on cefdinir bid x10 days, has 3 pills left today , reports she feels mcuh better , still coughing copiuus amonts of thick white sputum , denies fever nor chills, nor body aches .    DJD (degenerative joint disease)    GERD (gastroesophageal reflux disease)    History of absence seizures    last confirmed seizure age 42     Hx of colonic polyps    Hypercholesteremia    Hypertension    Obesity    Positive PPD    Reflex sympathetic dystrophy    Sleep apnea    Varicose veins    Venous insufficiency     SURGICAL HISTORY: Past Surgical History:  Procedure Laterality Date   ABDOMINAL HYSTERECTOMY     cspine surgery  1993   for arnold-chiari malformation   JOINT REPLACEMENT  06/05/11   Left total knee replacement   right knee arthroscopy  12/2007   Dr. Tonita Cong   right total  knee replacement  05/2008   Dr. Tonita Cong   TOTAL KNEE REVISION Left 02/03/2019   Procedure: LEFT TOTAL KNEE REVISION;  Surgeon: Rod Can, MD;  Location: [facility];  Service: Orthopedics;  Laterality: Left;    SOCIAL HISTORY: Social History   Socioeconomic History   Marital status: Widowed    Spouse name: Not on file   Number of children: 2   Years of education: 68   Highest education level: Not on file  Occupational History   Occupation: retired  Tobacco Use   Smoking status: Former    Packs/day: 0.30    Years: 12.00    Total pack years: 3.60    Types: Cigarettes    Quit date: 01/02/2012    Years since quitting: 10.9   Smokeless tobacco: Never   Tobacco comments:    1 pack per week  Vaping Use   Vaping Use: Never used  Substance and  Sexual Activity   Alcohol use: No   Drug use: No   Sexual activity: Not on file  Other Topics Concern   Not on file  Social History Narrative   Fun/Hobby: Playing cards and mingling.   Denies abuse and feels safe at home.    Social Determinants of Health   Financial Resource Strain: Not on file  Food Insecurity: Not on file  Transportation Needs: Not on file  Physical Activity: Not on file  Stress: Not on file  Social Connections: Not on file  Intimate Partner Violence: Not on file    FAMILY HISTORY: Family History  Problem Relation Age of Onset   Heart disease Mother    Stroke Mother    Stroke Father    Clotting disorder Father    Colon cancer Neg Hx    Stomach cancer Neg Hx     ALLERGIES:  has No Known Allergies.  MEDICATIONS:  Current Outpatient Medications  Medication Sig Dispense Refill   albuterol (PROVENTIL HFA;VENTOLIN HFA) 108 (90 BASE) MCG/ACT inhaler Inhale 2 puffs into the lungs every 4 (four) hours as needed. For wheeze or shortness of breath 1 Inhaler 6   alendronate (FOSAMAX) 70 MG tablet TAKE 1 TABLET BY MOUTH ONCE WEEKLY BEFORE THE FIRST FOOD, BEVERAGE OR MEDICINE OF THE DAY WITH PLAIN WATER 4 tablet 1    aspirin 81 MG tablet Take 81 mg by mouth daily.     azelastine (ASTELIN) 0.1 % nasal spray Place 1 spray into both nostrils daily as needed for rhinitis or allergies.      budesonide-formoterol (SYMBICORT) 160-4.5 MCG/ACT inhaler      citalopram (CELEXA) 10 MG tablet Take 1 tablet (10 mg total) by mouth daily. 90 tablet 3   [employer] D3 50 MCG (2000 UT) CAPS Take 1 capsule by mouth daily.     diclofenac Sodium (VOLTAREN) 1 % GEL Voltaren Arthritis Pain 1 % topical gel  APPLY 2 GRAMS TO THE AFFECTED AREA(S) BY TOPICAL ROUTE 4 TIMES PER DAY     donepezil (ARICEPT ODT) 5 MG disintegrating tablet Take 1 tablet (5 mg total) by mouth at bedtime. 90 tablet 3   EPINEPHrine 0.3 mg/0.3 mL IJ SOAJ injection Inject 0.3 mg into the muscle as needed for anaphylaxis.     famotidine (PEPCID) 20 MG tablet TAKE 1 TABLET BY MOUTH TWICE A DAY 180 tablet 1   ferrous sulfate 325 (65 FE) MG EC tablet Take 325 mg by mouth daily.     fluticasone (FLONASE) 50 MCG/ACT nasal spray Place 1 spray into both nostrils 2 (two) times daily as needed for allergies or rhinitis.     furosemide (LASIX) 40 MG tablet TAKE 2 TABLETS BY MOUTH EVERY DAY NEED OFFICE VISIT 180 tablet 2   lidocaine (LIDODERM) 5 % Place 1 patch onto the skin daily. Remove & Discard patch within 12 hours or as directed by MD (Patient not taking: Reported on 12/16/2022) 7 patch 0   meloxicam (MOBIC) 15 MG tablet TAKE 1 TABLET BY MOUTH EVERY DAY AS NEEDED FOR PAIN 30 tablet 5   montelukast (SINGULAIR) 10 MG tablet TAKE 1 TABLET BY MOUTH EVERY DAY IN THE EVENING 90 tablet 1   pantoprazole (PROTONIX) 40 MG tablet TAKE 1 TABLET BY MOUTH EVERY DAY 90 tablet 1   potassium chloride SA (KLOR-CON M20) 20 MEQ tablet Take 1 tablet (20 mEq total) by mouth 2 (two) times daily. 180 tablet 3   rosuvastatin (CRESTOR) 40 MG tablet Take 1 tablet (40  mg total) by mouth daily. 90 tablet 3   Sodium Chloride-Sodium Bicarb (SINUS [place] SQUEEZE BOTTLE) 2300-700 MG KIT See admin instructions.      Tiotropium Bromide Monohydrate (SPIRIVA RESPIMAT) 1.25 MCG/ACT AERS Inhale 1.25 mcg into the lungs daily.     tiZANidine (ZANAFLEX) 2 MG tablet Take 1 tablet (2 mg total) by mouth every 6 (six) hours as needed for muscle spasms. 40 tablet 1   traMADol (ULTRAM) 50 MG tablet TAKE 1 TABLET BY MOUTH EVERY 6 HOURS AS NEEDED. 60 tablet 1   traZODone (DESYREL) 100 MG tablet TAKE 1 TABLET BY MOUTH EVERYDAY AT BEDTIME 90 tablet 1   No current facility-administered medications for this visit.     PHYSICAL EXAMINATION: ECOG PERFORMANCE STATUS: 0 - Asymptomatic  Vitals:   12/20/22 1138  BP: (!) 143/66  Pulse: (!) 53  Resp: 18  Temp: 97.7 F (36.5 C)  SpO2: 95%   Filed Weights   12/20/22 1138  Weight: 223 lb 1.6 oz (101.2 kg)    GENERAL:alert, no distress and comfortable, walks with a cane SKIN: skin color, texture, turgor are normal, no rashes or significant lesions EYES: normal, conjunctiva are pink and non-injected, sclera clear NECK: supple, thyroid normal size, non-tender, without nodularity LYMPH:  no palpable lymphadenopathy in the cervical, axillary LUNGS: clear to auscultation and percussion with normal breathing effort HEART: regular rate & rhythm and no murmurs  Lower extremities: Chronic venous stasis changes, hyperpigmentation noted in the bilateral lower extremities ABDOMEN:abdomen soft, non-tender and normal bowel sounds Musculoskeletal:no cyanosis of digits and no clubbing  PSYCH: alert & oriented x 3 with fluent speech NEURO: no focal motor/sensory deficits  LABORATORY DATA:  I have reviewed the data as listed Lab Results  Component Value Date   WBC 2.5 (L) 06/18/2022   HGB 11.3 (L) 06/18/2022   HCT 35.5 (L) 06/18/2022   MCV 83.5 06/18/2022   PLT 147.0 (L) 06/18/2022     Chemistry      Component Value Date/Time   NA 140 06/18/2022 1143   NA 142 11/07/2019 1605   K 4.5 06/18/2022 1143   CL 103 06/18/2022 1143   CO2 30 06/18/2022 1143   BUN 12 06/18/2022  1143   BUN 15 11/07/2019 1605   CREATININE 0.84 06/18/2022 1143      Component Value Date/Time   CALCIUM 9.1 06/18/2022 1143   ALKPHOS 67 06/18/2022 1143   AST 17 06/18/2022 1143   ALT 9 06/18/2022 1143   BILITOT 0.4 06/18/2022 1143   BILITOT 0.4 11/07/2019 1605     I have reviewed her labs for the past couple years.  She does have progressive leukopenia and neutropenia.  Anemia has overall remained stable, normocytic normochromic Ferritin of 132.3 TIBC of 311 Iron Sat 17 Folate serum 10.9  RADIOGRAPHIC STUDIES: I have personally reviewed the radiological images as listed and agreed with the findings in the report. CT ABDOMEN PELVIS W CONTRAST  Result Date: 12/11/2022 CLINICAL DATA:  Left-sided abdominal pain and diarrhea. EXAM: CT ABDOMEN AND PELVIS WITH CONTRAST TECHNIQUE: Multidetector CT imaging of the abdomen and pelvis was performed using the standard protocol following bolus administration of intravenous contrast. RADIATION DOSE REDUCTION: This exam was performed according to the departmental dose-optimization program which includes automated exposure control, adjustment of the mA and/or kV according to patient size and/or use of iterative reconstruction technique. CONTRAST:  137m ISOVUE-300 IOPAMIDOL (ISOVUE-300) INJECTION 61% COMPARISON:  Remote study from 2006 FINDINGS: Lower chest: No acute pulmonary findings at the  lung bases. The heart is mildly enlarged. No pericardial effusion. There is a moderate tortuosity of the lower thoracic aorta. Hepatobiliary: No worrisome hepatic lesions are identified. Mild central intrahepatic biliary dilatation and moderate common bile duct dilatation. The common bile duct measures a maximum 13 mm in the porta hepatis it does taper in the head of the pancreas. Recommend correlation with liver function studies. Right upper quadrant ultrasound may be helpful for further evaluation also. I do not see any evidence of acute cholecystitis. Pancreas: No  mass, inflammation or ductal dilatation. Spleen: Normal size. No focal lesions. Adrenals/Urinary Tract: Adrenal glands are normal. No worrisome renal lesions or hydronephrosis. The bladder is unremarkable. No collecting system abnormalities are identified on the delayed images. Stomach/Bowel: The stomach, duodenum, small and colon unremarkable. No acute inflammatory process, mass lesions or obstructive findings. The terminal ileum is normal. Suspect prior appendectomy. Moderate stool burden. Vascular/Lymphatic: Moderate tortuosity and calcification of the abdominal aorta but no aneurysm or dissection. The branch vessels are patent. The major venous structures are patent. Small scattered mesenteric and retroperitoneal lymph nodes but no mass or adenopathy. Reproductive: Surgically absent. Other: No pelvic mass or adenopathy. No free pelvic fluid collections. No inguinal mass or adenopathy. No abdominal wall hernia or subcutaneous lesions. Musculoskeletal: No significant bony findings. Age related degenerative changes involving the spine. 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 IMPRESSION: 1. No acute abdominal/pelvic findings, mass lesions or lymphadenopathy. 2. Intra and extrahepatic biliary dilatation. Recommend correlation with liver function studies. Right upper quadrant ultrasound may be helpful for further evaluation. 3. Moderate stool burden. Aortic Atherosclerosis (ICD10-I70.0). Electronically Signed   By: Marijo Sanes M.D.   On: 12/11/2022 12:17    All questions were answered. The patient knows to call the clinic with any problems, questions or concerns. I spent 45 minutes in the care of this patient including H and P, review of records, counseling and coordination of care.     Benay Pike, MD 12/20/2022 11:55 AM

## 2022-12-22 LAB — ANTINUCLEAR ANTIBODIES, IFA: ANA Ab, IFA: NEGATIVE

## 2022-12-22 NOTE — Telephone Encounter (Signed)
The pt has been advised that the iron will cause dark stools and states she has not been taking any pepto. She will proceed with [place] and miralax.

## 2022-12-23 DIAGNOSIS — K219 Gastro-esophageal reflux disease without esophagitis: Secondary | ICD-10-CM | POA: Diagnosis not present

## 2022-12-23 DIAGNOSIS — R059 Cough, unspecified: Secondary | ICD-10-CM | POA: Diagnosis not present

## 2022-12-23 DIAGNOSIS — J4540 Moderate persistent asthma, uncomplicated: Secondary | ICD-10-CM | POA: Diagnosis not present

## 2022-12-23 DIAGNOSIS — J3089 Other allergic rhinitis: Secondary | ICD-10-CM | POA: Diagnosis not present

## 2022-12-23 LAB — FOLATE RBC
Folate, Hemolysate: 303 ng/mL
Folate, RBC: 907 ng/mL (ref >498)
Hematocrit: 33.4 % — ABNORMAL LOW (ref 34.0–46.6)

## 2022-12-24 DIAGNOSIS — G8929 Other chronic pain: Secondary | ICD-10-CM | POA: Diagnosis not present

## 2022-12-24 DIAGNOSIS — I10 Essential (primary) hypertension: Secondary | ICD-10-CM | POA: Diagnosis not present

## 2022-12-24 DIAGNOSIS — E7800 Pure hypercholesterolemia, unspecified: Secondary | ICD-10-CM | POA: Diagnosis not present

## 2022-12-24 DIAGNOSIS — I509 Heart failure, unspecified: Secondary | ICD-10-CM | POA: Diagnosis not present

## 2022-12-24 DIAGNOSIS — J4540 Moderate persistent asthma, uncomplicated: Secondary | ICD-10-CM | POA: Diagnosis not present

## 2022-12-25 ENCOUNTER — Ambulatory Visit (HOSPITAL_COMMUNITY)
Admission: RE | Admit: 2022-12-25 | Discharge: 2022-12-25 | Disposition: A | Discharge status: Home / Self Care | Payer: Medicare PPO | Source: Ambulatory Visit | Attending: Gastroenterology | Admitting: Gastroenterology

## 2022-12-25 DIAGNOSIS — K838 Other specified diseases of biliary tract: Secondary | ICD-10-CM

## 2022-12-25 DIAGNOSIS — R9389 Abnormal findings on diagnostic imaging of other specified body structures: Secondary | ICD-10-CM

## 2022-12-25 DIAGNOSIS — R109 Unspecified abdominal pain: Secondary | ICD-10-CM | POA: Diagnosis not present

## 2022-12-25 DIAGNOSIS — R1010 Upper abdominal pain, unspecified: Secondary | ICD-10-CM

## 2022-12-27 ENCOUNTER — Other Ambulatory Visit: Payer: Self-pay | Admitting: Internal Medicine

## 2022-12-27 NOTE — Telephone Encounter (Signed)
Please refill as per office routine med refill policy (all routine meds to be refilled for 3 mo or monthly (per pt preference) up to one year from last visit, then month to month grace period for 3 mo, then further med refills will have to be denied)

## 2023-01-05 ENCOUNTER — Other Ambulatory Visit: Payer: Self-pay

## 2023-01-05 ENCOUNTER — Encounter: Payer: Self-pay | Admitting: Hematology and Oncology

## 2023-01-05 ENCOUNTER — Inpatient Hospital Stay: Payer: Medicare PPO | Attending: Hematology and Oncology | Admitting: Hematology and Oncology

## 2023-01-05 VITALS — BP 116/57 | HR 51 | Temp 97.8°F | Resp 16 | Ht 64.0 in | Wt 218.4 lb

## 2023-01-05 DIAGNOSIS — I2510 Atherosclerotic heart disease of native coronary artery without angina pectoris: Secondary | ICD-10-CM | POA: Diagnosis not present

## 2023-01-05 DIAGNOSIS — D72819 Decreased white blood cell count, unspecified: Secondary | ICD-10-CM | POA: Insufficient documentation

## 2023-01-05 DIAGNOSIS — I739 Peripheral vascular disease, unspecified: Secondary | ICD-10-CM | POA: Diagnosis not present

## 2023-01-05 DIAGNOSIS — I10 Essential (primary) hypertension: Secondary | ICD-10-CM | POA: Diagnosis not present

## 2023-01-05 DIAGNOSIS — D649 Anemia, unspecified: Secondary | ICD-10-CM | POA: Insufficient documentation

## 2023-01-05 NOTE — Progress Notes (Unsigned)
[place] NOTE  Patient Care Team: Biagio Borg, MD as PCP - General (Internal Medicine) [name] [name], MD as PCP - Cardiology (Cardiology) Susa Day, MD (Orthopedic Surgery) Lelon Perla, MD (Cardiology)  CHIEF COMPLAINTS/PURPOSE OF CONSULTATION:  Leukopenia and anemia  ASSESSMENT & PLAN:   This is a very pleasant 82 year old female patient with past medical history significant for hypertension, dyslipidemia, coronary artery disease, peripheral vascular disease referred to hematology for evaluation and recommendations regarding leukopenia and normocytic normochromic anemia  #1 leukopenia, mild and progressive.  I have reviewed labs for the past couple years and she has had mild leukopenia and neutropenia for the past couple years which has been slowly progressive.  She does not have any history of recurrent infections or hospitalizations or antibiotic use.  We have discussed the common causes of leukopenia including but not limited to nutritional deficiencies, autoimmune diseases, medications, bone marrow disorders etc.  Will repeat CBC, iron panel, ferritin, Q33, folic acid levels, ANA and TSH today.   She understands that myelodysplasia could be a potential etiology for this leukopenia normocytic normochromic anemia.  Most recent labs with no evidence of clear abnormal morphology.  No clear evidence of iron deficiency, A07 or folic acid deficiency.  ANA negative.  No evidence of acute hepatitis or hypothyroidism.  #2 normocytic normochromic anemia, on iron oral iron supplementation Stable compared to last visit.  No evidence of nutritional deficiency or hemolysis.  I still believe she may have myelodysplasia however she is not a good candidate for any treatment.  Today we have discussed about proceeding with bone marrow aspiration and biopsy. She is agreeable to proceeding with this. She is still struggling with diarrhea, had some imaging and has a  follow up scheduled, but not exactly sure of the plan. RTC in 4 weeks with me.  HISTORY OF PRESENTING ILLNESS:  [name] 82 y.o. female is here because of Leukopenia and Anemia.  This is a very pleasant 82 year old female patient with past medical history significant for coronary artery disease, COPD, hypertension, dyslipidemia peripheral vascular disease referred to hematology for progressive leukopenia and anemia.  She is here to review most recent labs.  She only reported some watery bowel movements and diarrhea which was her biggest symptom during her last visit.  Interval History She still reports ongoing diarrhea, had some imaging which showed gall stones. She has a GI doctor and was also referred to surgical center for the same. She is not entirely sure of the plan at this time for surgery. She says her doctor asked her to take dulcolax and miralax because they wondered if the diarrhea is because of leakage of stool with underlying constipation, this hasn't helped any. Rest of the pertinent 10 point ROS reviewed and with no change.   REVIEW OF SYSTEMS:   Constitutional: Denies fevers, chills or abnormal night sweats Eyes: Denies blurriness of vision, double vision or watery eyes Ears, nose, mouth, throat, and face: Denies mucositis or sore throat Respiratory: Denies cough, dyspnea or wheezes Cardiovascular: Denies palpitation, chest discomfort or lower extremity swelling Gastrointestinal:  Denies nausea, heartburn or change in bowel habits Skin: Denies abnormal skin rashes Lymphatics: Denies new lymphadenopathy or easy bruising Neurological:Denies numbness, tingling or new weaknesses Behavioral/Psych: Mood is stable, no new changes  All other systems were reviewed with the patient and are negative.  MEDICAL HISTORY:  Past Medical History:  Diagnosis Date   Allergic rhinitis    Arnold-Chiari malformation ([place])  Asthma    CAD (coronary artery disease)    COPD (chronic  obstructive pulmonary disease) (HCC)    Coughing    coughing since last 01-22-2019 , started on cefdinir bid x10 days, has 3 pills left today , reports she feels mcuh better , still coughing copiuus amonts of thick white sputum , denies fever nor chills, nor body aches .    DJD (degenerative joint disease)    GERD (gastroesophageal reflux disease)    History of absence seizures    last confirmed seizure age 73     Hx of colonic polyps    Hypercholesteremia    Hypertension    Obesity    Positive PPD    Reflex sympathetic dystrophy    Sleep apnea    Varicose veins    Venous insufficiency     SURGICAL HISTORY: Past Surgical History:  Procedure Laterality Date   ABDOMINAL HYSTERECTOMY     cspine surgery  1993   for arnold-chiari malformation   JOINT REPLACEMENT  06/05/11   Left total knee replacement   right knee arthroscopy  12/2007   Dr. Tonita Cong   right total knee replacement  05/2008   Dr. Tonita Cong   TOTAL KNEE REVISION Left 02/03/2019   Procedure: LEFT TOTAL KNEE REVISION;  Surgeon: Rod Can, MD;  Location: [facility];  Service: Orthopedics;  Laterality: Left;    SOCIAL HISTORY: Social History   Socioeconomic History   Marital status: Widowed    Spouse name: Not on file   Number of children: 2   Years of education: 47   Highest education level: Not on file  Occupational History   Occupation: retired  Tobacco Use   Smoking status: Former    Packs/day: 0.30    Years: 12.00    Total pack years: 3.60    Types: Cigarettes    Quit date: 01/02/2012    Years since quitting: 11.0   Smokeless tobacco: Never   Tobacco comments:    1 pack per week  Vaping Use   Vaping Use: Never used  Substance and Sexual Activity   Alcohol use: No   Drug use: No   Sexual activity: Not on file  Other Topics Concern   Not on file  Social History Narrative   Fun/Hobby: Playing cards and mingling.   Denies abuse and feels safe at home.    Social Determinants of Health   Financial Resource  Strain: Not on file  Food Insecurity: Not on file  Transportation Needs: Not on file  Physical Activity: Not on file  Stress: Not on file  Social Connections: Not on file  Intimate Partner Violence: Not on file    FAMILY HISTORY: Family History  Problem Relation Age of Onset   Heart disease Mother    Stroke Mother    Stroke Father    Clotting disorder Father    Colon cancer Neg Hx    Stomach cancer Neg Hx     ALLERGIES:  has No Known Allergies.  MEDICATIONS:  Current Outpatient Medications  Medication Sig Dispense Refill   albuterol (PROVENTIL HFA;VENTOLIN HFA) 108 (90 BASE) MCG/ACT inhaler Inhale 2 puffs into the lungs every 4 (four) hours as needed. For wheeze or shortness of breath 1 Inhaler 6   alendronate (FOSAMAX) 70 MG tablet TAKE 1 TABLET BY MOUTH ONCE WEEKLY BEFORE THE FIRST FOOD, BEVERAGE OR MEDICINE OF THE DAY WITH PLAIN WATER 4 tablet 1   aspirin 81 MG tablet Take 81 mg by mouth daily.  azelastine (ASTELIN) 0.1 % nasal spray Place 1 spray into both nostrils daily as needed for rhinitis or allergies.      budesonide-formoterol (SYMBICORT) 160-4.5 MCG/ACT inhaler      citalopram (CELEXA) 10 MG tablet Take 1 tablet (10 mg total) by mouth daily. 90 tablet 3   [employer]S D3 50 MCG (2000 UT) CAPS Take 1 capsule by mouth daily.     diclofenac Sodium (VOLTAREN) 1 % GEL Voltaren Arthritis Pain 1 % topical gel  APPLY 2 GRAMS TO THE AFFECTED AREA(S) BY TOPICAL ROUTE 4 TIMES PER DAY     donepezil (ARICEPT ODT) 5 MG disintegrating tablet Take 1 tablet (5 mg total) by mouth at bedtime. 90 tablet 3   EPINEPHrine 0.3 mg/0.3 mL IJ SOAJ injection Inject 0.3 mg into the muscle as needed for anaphylaxis.     famotidine (PEPCID) 20 MG tablet TAKE 1 TABLET BY MOUTH TWICE A DAY 180 tablet 1   ferrous sulfate 325 (65 FE) MG EC tablet Take 325 mg by mouth daily.     fluticasone (FLONASE) 50 MCG/ACT nasal spray Place 1 spray into both nostrils 2 (two) times daily as needed for allergies or  rhinitis.     furosemide (LASIX) 40 MG tablet TAKE 2 TABLETS BY MOUTH EVERY DAY NEED OFFICE VISIT 180 tablet 2   lidocaine (LIDODERM) 5 % Place 1 patch onto the skin daily. Remove & Discard patch within 12 hours or as directed by MD (Patient not taking: Reported on 12/16/2022) 7 patch 0   meloxicam (MOBIC) 15 MG tablet TAKE 1 TABLET BY MOUTH EVERY DAY AS NEEDED FOR PAIN 30 tablet 5   montelukast (SINGULAIR) 10 MG tablet TAKE 1 TABLET BY MOUTH EVERY DAY IN THE EVENING 90 tablet 1   pantoprazole (PROTONIX) 40 MG tablet TAKE 1 TABLET BY MOUTH EVERY DAY 90 tablet 1   potassium chloride SA (KLOR-CON M20) 20 MEQ tablet Take 1 tablet (20 mEq total) by mouth 2 (two) times daily. 180 tablet 3   rosuvastatin (CRESTOR) 40 MG tablet Take 1 tablet (40 mg total) by mouth daily. 90 tablet 3   Sodium Chloride-Sodium Bicarb (SINUS [place] SQUEEZE BOTTLE) 2300-700 MG KIT See admin instructions.     Tiotropium Bromide Monohydrate (SPIRIVA RESPIMAT) 1.25 MCG/ACT AERS Inhale 1.25 mcg into the lungs daily.     tiZANidine (ZANAFLEX) 2 MG tablet Take 1 tablet (2 mg total) by mouth every 6 (six) hours as needed for muscle spasms. 40 tablet 1   traMADol (ULTRAM) 50 MG tablet TAKE 1 TABLET BY MOUTH EVERY 6 HOURS AS NEEDED. 60 tablet 1   traZODone (DESYREL) 100 MG tablet TAKE 1 TABLET BY MOUTH EVERYDAY AT BEDTIME 90 tablet 1   No current facility-administered medications for this visit.     PHYSICAL EXAMINATION: ECOG PERFORMANCE STATUS: 0 - Asymptomatic  Vitals:   01/05/23 1032  BP: (!) 116/57  Pulse: (!) 51  Resp: 16  Temp: 97.8 F (36.6 C)  SpO2: 98%    Filed Weights   01/05/23 1032  Weight: 218 lb 6.4 oz (99.1 kg)    PE deferred in lieu of counseling30  LABORATORY DATA:  I have reviewed the data as listed Lab Results  Component Value Date   WBC 2.7 (L) 12/20/2022   HGB 10.9 (L) 12/20/2022   HCT 33.4 (L) 12/20/2022   MCV 86.0 12/20/2022   PLT 174 12/20/2022     Chemistry      Component Value  Date/Time   NA 139 12/20/2022  1204   NA 142 11/07/2019 1605   K 3.9 12/20/2022 1204   CL 107 12/20/2022 1204   CO2 27 12/20/2022 1204   BUN 8 12/20/2022 1204   BUN 15 11/07/2019 1605   CREATININE 0.79 12/20/2022 1204      Component Value Date/Time   CALCIUM 9.1 12/20/2022 1204   ALKPHOS 62 12/20/2022 1204   AST 15 12/20/2022 1204   ALT 7 12/20/2022 1204   BILITOT 0.4 12/20/2022 1204     I have reviewed her labs for the past couple years.  She does have progressive leukopenia and neutropenia.  Anemia has overall remained stable, normocytic normochromic Ferritin of 132.3 TIBC of 311 Iron Sat 17 Folate serum 10.9  RADIOGRAPHIC STUDIES: I have personally reviewed the radiological images as listed and agreed with the findings in the report. US Abdomen Limited RUQ (LIVER/GB)  Result Date: 12/25/2022 CLINICAL DATA:  Abdominal pain for 3 months EXAM: ULTRASOUND ABDOMEN LIMITED RIGHT UPPER QUADRANT COMPARISON:  March 06, 2021 ultrasound FINDINGS: Gallbladder: Ten shadowing posterior to the gallbladder consistent with numerous stones. There is a dominant 1.9 cm stone near the neck of the gallbladder. The gallbladder wall measures 3 mm which is borderline thickness. No Murphy's sign or pericholecystic fluid. Common bile duct: Diameter: 7 mm Liver: No focal lesion identified. Within normal limits in parenchymal echogenicity. Portal vein is patent on color Doppler imaging with normal direction of blood flow towards the liver. Other: None. IMPRESSION: 1. Numerous stones in the gallbladder. The gallbladder wall measures 3 mm which is borderline. No Murphy's sign or pericholecystic fluid. 2. The common bile duct measures 7 mm which is prominent. Recommend correlation with LFTs/bilirubin. If there is concern for obstruction, recommend MRCP or ERCP. 3. No other abnormalities. Electronically Signed   By: Dorise Bullion III M.D.   On: 12/25/2022 10:45   CT ABDOMEN PELVIS W CONTRAST  Result Date:  12/11/2022 CLINICAL DATA:  Left-sided abdominal pain and diarrhea. EXAM: CT ABDOMEN AND PELVIS WITH CONTRAST TECHNIQUE: Multidetector CT imaging of the abdomen and pelvis was performed using the standard protocol following bolus administration of intravenous contrast. RADIATION DOSE REDUCTION: This exam was performed according to the departmental dose-optimization program which includes automated exposure control, adjustment of the mA and/or kV according to patient size and/or use of iterative reconstruction technique. CONTRAST:  148m ISOVUE-300 IOPAMIDOL (ISOVUE-300) INJECTION 61% COMPARISON:  Remote study from 2006 FINDINGS: Lower chest: No acute pulmonary findings at the lung bases. The heart is mildly enlarged. No pericardial effusion. There is a moderate tortuosity of the lower thoracic aorta. Hepatobiliary: No worrisome hepatic lesions are identified. Mild central intrahepatic biliary dilatation and moderate common bile duct dilatation. The common bile duct measures a maximum 13 mm in the porta hepatis it does taper in the head of the pancreas. Recommend correlation with liver function studies. Right upper quadrant ultrasound may be helpful for further evaluation also. I do not see any evidence of acute cholecystitis. Pancreas: No mass, inflammation or ductal dilatation. Spleen: Normal size. No focal lesions. Adrenals/Urinary Tract: Adrenal glands are normal. No worrisome renal lesions or hydronephrosis. The bladder is unremarkable. No collecting system abnormalities are identified on the delayed images. Stomach/Bowel: The stomach, duodenum, small and colon unremarkable. No acute inflammatory process, mass lesions or obstructive findings. The terminal ileum is normal. Suspect prior appendectomy. Moderate stool burden. Vascular/Lymphatic: Moderate tortuosity and calcification of the abdominal aorta but no aneurysm or dissection. The branch vessels are patent. The major venous structures are patent. Small  scattered mesenteric and retroperitoneal lymph nodes but no mass or adenopathy. Reproductive: Surgically absent. Other: No pelvic mass or adenopathy. No free pelvic fluid collections. No inguinal mass or adenopathy. No abdominal wall hernia or subcutaneous lesions. Musculoskeletal: No significant bony findings. Age related degenerative changes involving the spine. 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 IMPRESSION: 1. No acute abdominal/pelvic findings, mass lesions or lymphadenopathy. 2. Intra and extrahepatic biliary dilatation. Recommend correlation with liver function studies. Right upper quadrant ultrasound may be helpful for further evaluation. 3. Moderate stool burden. Aortic Atherosclerosis (ICD10-I70.0). Electronically Signed   By: Marijo Sanes M.D.   On: 12/11/2022 12:17    All questions were answered. The patient knows to call the clinic with any problems, questions or concerns. I spent 30 minutes in the care of this patient including H and P, review of records, counseling and coordination of care.     Benay Pike, MD 01/05/2023 10:42 AM

## 2023-01-09 DIAGNOSIS — K8020 Calculus of gallbladder without cholecystitis without obstruction: Secondary | ICD-10-CM | POA: Diagnosis not present

## 2023-01-09 DIAGNOSIS — R197 Diarrhea, unspecified: Secondary | ICD-10-CM | POA: Diagnosis not present

## 2023-01-15 ENCOUNTER — Other Ambulatory Visit: Payer: Self-pay | Admitting: Internal Medicine

## 2023-01-27 ENCOUNTER — Other Ambulatory Visit: Payer: Self-pay | Admitting: Radiology

## 2023-01-27 DIAGNOSIS — D72819 Decreased white blood cell count, unspecified: Secondary | ICD-10-CM

## 2023-01-27 NOTE — Consult Note (Signed)
Chief Complaint: Patient was seen in consultation today for image guided bone marrow biopsy   Requesting Physician: Lonell Face  Supervising Physician: Aletta Edouard  Patient Status: [name]  History of Present Illness: [name] is an 82 y.o. female with past medical history of Roselie Awkward Chiari malformation, asthma, coronary artery disease, COPD, DJD, GERD, childhood absence seizures, colon polyps, hypercholesterolemia, hypertension, obesity, RSD, sleep apnea, venous insufficiency, peripheral vascular disease who presents now with mildly progressive leukopenia and normocytic normochromic anemia.  She is scheduled today for image guided bone marrow biopsy to rule out myelodysplasia.  Past Medical History:  Diagnosis Date   Allergic rhinitis    Arnold-Chiari malformation (HCC)    Asthma    CAD (coronary artery disease)    COPD (chronic obstructive pulmonary disease) (HCC)    Coughing    coughing since last 01-22-2019 , started on cefdinir bid x10 days, has 3 pills left today , reports she feels mcuh better , still coughing copiuus amonts of thick white sputum , denies fever nor chills, nor body aches .    DJD (degenerative joint disease)    GERD (gastroesophageal reflux disease)    History of absence seizures    last confirmed seizure age 60     Hx of colonic polyps    Hypercholesteremia    Hypertension    Obesity    Positive PPD    Reflex sympathetic dystrophy    Sleep apnea    Varicose veins    Venous insufficiency     Past Surgical History:  Procedure Laterality Date   ABDOMINAL HYSTERECTOMY     cspine surgery  1993   for arnold-chiari malformation   JOINT REPLACEMENT  06/05/11   Left total knee replacement   right knee arthroscopy  12/2007   Dr. Tonita Cong   right total knee replacement  05/2008   Dr. Tonita Cong   TOTAL KNEE REVISION Left 02/03/2019   Procedure: LEFT TOTAL KNEE REVISION;  Surgeon: Rod Can, MD;  Location: [facility];  Service: Orthopedics;   Laterality: Left;    Allergies: Patient has no known allergies.  Medications: Prior to Admission medications   Medication Sig Start Date End Date Taking? Authorizing Provider  albuterol (PROVENTIL HFA;VENTOLIN HFA) 108 (90 BASE) MCG/ACT inhaler Inhale 2 puffs into the lungs every 4 (four) hours as needed. For wheeze or shortness of breath 03/26/12   Noralee Space, MD  alendronate (FOSAMAX) 70 MG tablet TAKE 1 TABLET BY MOUTH ONCE WEEKLY BEFORE THE FIRST FOOD, BEVERAGE OR MEDICINE OF THE DAY WITH PLAIN WATER 11/07/22   Biagio Borg, MD  aspirin 81 MG tablet Take 81 mg by mouth daily.    [provider]  azelastine (ASTELIN) 0.1 % nasal spray Place 1 spray into both nostrils daily as needed for rhinitis or allergies.  11/07/18   [provider]  budesonide-formoterol (SYMBICORT) 160-4.5 MCG/ACT inhaler     [provider]  citalopram (CELEXA) 10 MG tablet Take 1 tablet (10 mg total) by mouth daily. 05/07/21 05/07/22  Biagio Borg, MD  CVS D3 50 MCG (2000 UT) CAPS Take 1 capsule by mouth daily. 01/09/21   [provider]  diclofenac Sodium (VOLTAREN) 1 % GEL Voltaren Arthritis Pain 1 % topical gel  APPLY 2 GRAMS TO THE AFFECTED AREA(S) BY TOPICAL ROUTE 4 TIMES PER DAY    [provider]  donepezil (ARICEPT ODT) 5 MG disintegrating tablet Take 1 tablet (5 mg total) by mouth at bedtime. 05/07/21  Biagio Borg, MD  EPINEPHrine 0.3 mg/0.3 mL IJ SOAJ injection Inject 0.3 mg into the muscle as needed for anaphylaxis.    [provider]  famotidine (PEPCID) 20 MG tablet TAKE 1 TABLET BY MOUTH TWICE A DAY 11/21/22   Biagio Borg, MD  ferrous sulfate 325 (65 FE) MG EC tablet Take 325 mg by mouth daily.    [provider]  fluticasone (FLONASE) 50 MCG/ACT nasal spray Place 1 spray into both nostrils 2 (two) times daily as needed for allergies or rhinitis.    [provider]  furosemide (LASIX) 40 MG tablet TAKE 2 TABLETS BY MOUTH EVERY DAY  NEED OFFICE VISIT 12/29/22   Biagio Borg, MD  lidocaine (LIDODERM) 5 % Place 1 patch onto the skin daily. Remove & Discard patch within 12 hours or as directed by MD Patient not taking: Reported on 12/16/2022 10/30/22   Dorie Rank, MD  meloxicam (MOBIC) 15 MG tablet TAKE ONE TABLET BY MOUTH daily AS NEEDED FOR PAIN 01/15/23   Biagio Borg, MD  montelukast (SINGULAIR) 10 MG tablet TAKE 1 TABLET BY MOUTH EVERY DAY IN THE EVENING 11/27/22   Biagio Borg, MD  pantoprazole (PROTONIX) 40 MG tablet TAKE 1 TABLET BY MOUTH EVERY DAY 05/20/22   Biagio Borg, MD  potassium chloride SA (KLOR-CON M20) 20 MEQ tablet Take 1 tablet (20 mEq total) by mouth 2 (two) times daily. 05/07/21   Biagio Borg, MD  rosuvastatin (CRESTOR) 40 MG tablet Take 1 tablet (40 mg total) by mouth daily. 05/07/21 12/10/21  Biagio Borg, MD  Sodium Chloride-Sodium Bicarb (SINUS [place] SQUEEZE BOTTLE) 2300-700 MG KIT See admin instructions.    [provider]  Tiotropium Bromide Monohydrate (SPIRIVA RESPIMAT) 1.25 MCG/ACT AERS Inhale 1.25 mcg into the lungs daily.    [provider]  tiZANidine (ZANAFLEX) 2 MG tablet Take 1 tablet (2 mg total) by mouth every 6 (six) hours as needed for muscle spasms. 06/07/21   Biagio Borg, MD  traMADol Veatrice [place]) 50 MG tablet TAKE 1 TABLET BY MOUTH EVERY 6 HOURS AS NEEDED. 06/15/22   Biagio Borg, MD  traZODone (DESYREL) 100 MG tablet TAKE 1 TABLET BY MOUTH EVERYDAY AT BEDTIME 08/20/22   Biagio Borg, MD     Family History  Problem Relation Age of Onset   Heart disease Mother    Stroke Mother    Stroke Father    Clotting disorder Father    Colon cancer Neg Hx    Stomach cancer Neg Hx     Social History   Socioeconomic History   Marital status: Widowed    Spouse name: Not on file   Number of children: 2   Years of education: 67   Highest education level: Not on file  Occupational History   Occupation: retired  Tobacco Use   Smoking status: Former    Packs/day: 0.30     Years: 12.00    Total pack years: 3.60    Types: Cigarettes    Quit date: 01/02/2012    Years since quitting: 11.0   Smokeless tobacco: Never   Tobacco comments:    1 pack per week  Vaping Use   Vaping Use: Never used  Substance and Sexual Activity   Alcohol use: No   Drug use: No   Sexual activity: Not on file  Other Topics Concern   Not on file  Social History Narrative   Fun/Hobby: Playing cards and mingling.  Denies abuse and feels safe at home.    Social Determinants of Health   Financial Resource Strain: Not on file  Food Insecurity: Not on file  Transportation Needs: Not on file  Physical Activity: Not on file  Stress: Not on file  Social Connections: Not on file      Review of Systems  Vital Signs:   Code Status:    Physical Exam  Imaging: No results found.  Labs:  CBC: Recent Labs    06/18/22 1143 12/20/22 1204 12/20/22 1205  WBC 2.5* 2.7*  --   HGB 11.3* 10.9*  --   HCT 35.5* 35.0* 33.4*  PLT 147.0* 174  --     COAGS: No results for input(s): "INR", "APTT" in the last 8760 hours.  BMP: Recent Labs    06/18/22 1143 12/20/22 1204  NA 140 139  K 4.5 3.9  CL 103 107  CO2 30 27  GLUCOSE 85 77  BUN 12 8  CALCIUM 9.1 9.1  CREATININE 0.84 0.79  GFRNONAA  --  >60    LIVER FUNCTION TESTS: Recent Labs    06/18/22 1143 12/20/22 1204  BILITOT 0.4 0.4  AST 17 15  ALT 9 7  ALKPHOS 67 62  PROT 6.9 6.6  ALBUMIN 4.0 3.6    TUMOR MARKERS: No results for input(s): "AFPTM", "CEA", "CA199", "CHROMGRNA" in the last 8760 hours.  Assessment and Plan: 82 y.o. female with past medical history of Roselie Awkward Chiari malformation, asthma, coronary artery disease, COPD, DJD, GERD, childhood absence seizures, colon polyps, hypercholesterolemia, hypertension, obesity, RSD, sleep apnea, venous insufficiency, peripheral vascular disease who presents now with mildly progressive leukopenia and normocytic normochromic anemia.  She is scheduled today for  image guided bone marrow biopsy to rule out myelodysplasia.Risks and benefits of procedure was discussed with the patient including, but not limited to bleeding, infection, damage to adjacent structures or low yield requiring additional tests.  All of the questions were answered and there is agreement to proceed.  Consent signed and in chart.    Thank you for this interesting consult.  I greatly enjoyed meeting AIZA COTLER and look forward to participating in their care.  A copy of this report was sent to the requesting provider on this date.  Electronically Signed: D. Rowe Robert, PA-C 01/27/2023, 2:18 PM   I spent a total of  20 minutes   in face to face in clinical consultation, greater than 50% of which was counseling/coordinating care for image guided bone marrow biopsy

## 2023-01-28 ENCOUNTER — Encounter (HOSPITAL_COMMUNITY): Payer: Self-pay

## 2023-01-28 ENCOUNTER — Ambulatory Visit (HOSPITAL_COMMUNITY)
Admission: RE | Admit: 2023-01-28 | Discharge: 2023-01-28 | Disposition: A | Discharge status: Home / Self Care | Payer: Medicare PPO | Source: Ambulatory Visit | Attending: Hematology and Oncology | Admitting: Hematology and Oncology

## 2023-01-28 DIAGNOSIS — I10 Essential (primary) hypertension: Secondary | ICD-10-CM | POA: Diagnosis not present

## 2023-01-28 DIAGNOSIS — E7800 Pure hypercholesterolemia, unspecified: Secondary | ICD-10-CM | POA: Insufficient documentation

## 2023-01-28 DIAGNOSIS — K219 Gastro-esophageal reflux disease without esophagitis: Secondary | ICD-10-CM | POA: Insufficient documentation

## 2023-01-28 DIAGNOSIS — I872 Venous insufficiency (chronic) (peripheral): Secondary | ICD-10-CM | POA: Diagnosis not present

## 2023-01-28 DIAGNOSIS — G4730 Sleep apnea, unspecified: Secondary | ICD-10-CM | POA: Insufficient documentation

## 2023-01-28 DIAGNOSIS — Q0700 Arnold-Chiari syndrome without spina bifida or hydrocephalus: Secondary | ICD-10-CM | POA: Insufficient documentation

## 2023-01-28 DIAGNOSIS — I2510 Atherosclerotic heart disease of native coronary artery without angina pectoris: Secondary | ICD-10-CM | POA: Insufficient documentation

## 2023-01-28 DIAGNOSIS — D649 Anemia, unspecified: Secondary | ICD-10-CM | POA: Diagnosis not present

## 2023-01-28 DIAGNOSIS — I739 Peripheral vascular disease, unspecified: Secondary | ICD-10-CM | POA: Diagnosis not present

## 2023-01-28 DIAGNOSIS — Z87891 Personal history of nicotine dependence: Secondary | ICD-10-CM | POA: Diagnosis not present

## 2023-01-28 DIAGNOSIS — D72819 Decreased white blood cell count, unspecified: Secondary | ICD-10-CM | POA: Diagnosis not present

## 2023-01-28 HISTORY — PX: IR BONE MARROW BIOPSY & ASPIRATION: IMG5727

## 2023-01-28 HISTORY — DX: Diaphragmatic hernia without obstruction or gangrene: K44.9

## 2023-01-28 LAB — CBC WITH DIFFERENTIAL/PLATELET
Abs Immature Granulocytes: 0 10*3/uL (ref 0.00–0.07)
Basophils Absolute: 0 10*3/uL (ref 0.0–0.1)
Basophils Relative: 1 %
Eosinophils Absolute: 0.1 10*3/uL (ref 0.0–0.5)
Eosinophils Relative: 3 %
HCT: 36.1 % (ref 36.0–46.0)
Hemoglobin: 10.9 g/dL — ABNORMAL LOW (ref 12.0–15.0)
Immature Granulocytes: 0 %
Lymphocytes Relative: 28 %
Lymphs Abs: 0.9 10*3/uL (ref 0.7–4.0)
MCH: 26.1 pg (ref 26.0–34.0)
MCHC: 30.2 g/dL (ref 30.0–36.0)
MCV: 86.4 fL (ref 80.0–100.0)
Monocytes Absolute: 0.3 10*3/uL (ref 0.1–1.0)
Monocytes Relative: 11 %
Neutro Abs: 1.8 10*3/uL (ref 1.7–7.7)
Neutrophils Relative %: 57 %
Platelets: 184 10*3/uL (ref 150–400)
RBC: 4.18 MIL/uL (ref 3.87–5.11)
RDW: 13.9 % (ref 11.5–15.5)
WBC: 3.1 10*3/uL — ABNORMAL LOW (ref 4.0–10.5)
nRBC: 0 % (ref 0.0–0.2)

## 2023-01-28 MED ORDER — FENTANYL CITRATE (PF) 100 MCG/2ML IJ SOLN
INTRAMUSCULAR | Status: AC | PRN
  Administered 2023-01-28: 50 ug via INTRAVENOUS

## 2023-01-28 MED ORDER — MIDAZOLAM HCL 2 MG/2ML IJ SOLN
INTRAMUSCULAR | Status: AC | PRN
  Administered 2023-01-28: 1 mg via INTRAVENOUS

## 2023-01-28 MED ORDER — FLUMAZENIL 0.5 MG/5ML IV SOLN
INTRAVENOUS | Status: AC
  Filled 2023-01-28: qty 5

## 2023-01-28 MED ORDER — ATROPINE SULFATE 1 MG/10ML IJ SOSY
PREFILLED_SYRINGE | INTRAMUSCULAR | Status: AC
  Filled 2023-01-28: qty 10

## 2023-01-28 MED ORDER — SODIUM CHLORIDE 0.9 % IV SOLN: INTRAVENOUS | Status: DC

## 2023-01-28 MED ORDER — MIDAZOLAM HCL 2 MG/2ML IJ SOLN
INTRAMUSCULAR | Status: AC
  Filled 2023-01-28: qty 2

## 2023-01-28 MED ORDER — LIDOCAINE HCL (PF) 1 % IJ SOLN
INTRAMUSCULAR | Status: AC
  Filled 2023-01-28: qty 30

## 2023-01-28 MED ORDER — NALOXONE HCL 0.4 MG/ML IJ SOLN
INTRAMUSCULAR | Status: AC
  Filled 2023-01-28: qty 1

## 2023-01-28 MED ORDER — FENTANYL CITRATE (PF) 100 MCG/2ML IJ SOLN
INTRAMUSCULAR | Status: AC
  Filled 2023-01-28: qty 2

## 2023-01-28 NOTE — Discharge Instructions (Addendum)
Bone Marrow Aspiration and Bone Marrow Biopsy, Adult, Care After  The following information offers guidance on how to care for yourself after your procedure. Your health care provider may also give you more specific instructions. If you have problems or questions, contact your health care provider.  What can I expect after the procedure?  Take your regular medications.  May remove dressing or bandaid and shower tomorrow.  Keep site clean and dry. Replace with clean dressing or bandaid as necessary.  Urgent needs IR clinic [phone] (mon-fri 8-5).  After the procedure, it is common to have: Mild pain and tenderness. Swelling. Bruising. Follow these instructions at home: Incision care  Follow instructions from your health care provider about how to take care of the incision site. Make sure you: Wash your hands with soap and water for at least 20 seconds before and after you change your bandage (dressing). If soap and water are not available, use hand sanitizer. Change your dressing as told by your health care provider. Leave stitches (sutures), skin glue, or adhesive strips in place. These skin closures may need to stay in place for 2 weeks or longer. If adhesive strip edges start to loosen and curl up, you may trim the loose edges. Do not remove adhesive strips completely unless your health care provider tells you to do that. Check your incision site every day for signs of infection. Check for: More redness, swelling, or pain. Fluid or blood. Warmth. Pus or a bad smell. Activity Return to your normal activities as told by your health care provider. Ask your health care provider what activities are safe for you. Do not lift anything that is heavier than 10 lb (4.5 kg), or the limit that you are told, until your health care provider says that it is safe. If you were given a sedative during the procedure, it can affect you for several  hours. Do not drive or operate machinery until your health care provider says that it is safe. General instructions  Take over-the-counter and prescription medicines only as told by your health care provider. Do not take baths, swim, or use a hot tub until your health care provider approves. Ask your health care provider if you may take showers. You may only be allowed to take sponge baths. If directed, put ice on the affected area. To do this: Put ice in a plastic bag. Place a towel between your skin and the bag. Leave the ice on for 20 minutes, 2-3 times a day. If your skin turns bright red, remove the ice right away to prevent skin damage. The risk of skin damage is higher if you cannot feel pain, heat, or cold. Contact a health care provider if: You have signs of infection. Your pain is not controlled with medicine. You have cancer, and a temperature of 100.75F (38C) or higher. Get help right away if: You have a temperature of 101F (38.3C) or higher, or as told by your health care provider. You have bleeding from the incision site that cannot be controlled. This information is not intended to replace advice given to you by your health care provider. Make sure you discuss any questions you have with your health care provider. Document Revised: 03/24/2022 Document Reviewed: 03/24/2022 Elsevier Patient Education  [place].                            Moderate Conscious Sedation, Adult, Care After  This sheet gives  you information about how to care for yourself after your procedure. Your health care provider may also give you more specific instructions. If you have problems or questions, contact your health care provider. What can I expect after the procedure? After the procedure, it is common to have: Sleepiness for several hours. Impaired judgment for several hours. Difficulty with balance. Vomiting if you eat too soon. Follow these instructions at home: For the time  period you were told by your health care provider:     Rest. Do not participate in activities where you could fall or become injured. Do not drive or use machinery. Do not drink alcohol. Do not take sleeping pills or medicines that cause drowsiness. Do not make important decisions or sign legal documents. Do not take care of children on your own. Eating and drinking  Follow the diet recommended by your health care provider. Drink enough fluid to keep your urine pale yellow. If you vomit: Drink water, juice, or soup when you can drink without vomiting. Make sure you have little or no nausea before eating solid foods. General instructions Take over-the-counter and prescription medicines only as told by your health care provider. Have a responsible adult stay with you for the time you are told. It is important to have someone help care for you until you are awake and alert. Do not smoke. Keep all follow-up visits as told by your health care provider. This is important. Contact a health care provider if: You are still sleepy or having trouble with balance after 24 hours. You feel light-headed. You keep feeling nauseous or you keep vomiting. You develop a rash. You have a fever. You have redness or swelling around the IV site. Get help right away if: You have trouble breathing. You have new-onset confusion at home. Summary After the procedure, it is common to feel sleepy, have impaired judgment, or feel nauseous if you eat too soon. Rest after you get home. Know the things you should not do after the procedure. Follow the diet recommended by your health care provider and drink enough fluid to keep your urine pale yellow. Get help right away if you have trouble breathing or new-onset confusion at home. This information is not intended to replace advice given to you by your health care provider. Make sure you discuss any questions you have with your health care provider. Document  Revised: 03/16/2020 Document Reviewed: 10/13/2019 Elsevier Patient Education  [place].

## 2023-01-28 NOTE — Procedures (Signed)
Interventional Radiology Procedure Note  Procedure: CT guided bone marrow aspiration and biopsy  Complications: None  EBL: < 10 mL  Findings: Aspirate and core biopsy performed of bone marrow in right iliac bone.  Plan: Bedrest supine x 1 hrs  Cortavius Montesinos T. Zaeden Lastinger, M.D Pager:  [phone]   

## 2023-01-30 ENCOUNTER — Ambulatory Visit: Payer: Medicare PPO | Admitting: Podiatry

## 2023-01-30 ENCOUNTER — Encounter: Payer: Self-pay | Admitting: Podiatry

## 2023-01-30 DIAGNOSIS — J3089 Other allergic rhinitis: Secondary | ICD-10-CM | POA: Diagnosis not present

## 2023-01-30 DIAGNOSIS — Z89422 Acquired absence of other left toe(s): Secondary | ICD-10-CM | POA: Diagnosis not present

## 2023-01-30 DIAGNOSIS — M79674 Pain in right toe(s): Secondary | ICD-10-CM

## 2023-01-30 DIAGNOSIS — I739 Peripheral vascular disease, unspecified: Secondary | ICD-10-CM

## 2023-01-30 DIAGNOSIS — B351 Tinea unguium: Secondary | ICD-10-CM | POA: Diagnosis not present

## 2023-01-30 DIAGNOSIS — M79675 Pain in left toe(s): Secondary | ICD-10-CM | POA: Diagnosis not present

## 2023-01-30 LAB — SURGICAL PATHOLOGY

## 2023-01-30 NOTE — Patient Instructions (Signed)
Purchase Company secretary from [employer] on [address] in [place].  To [facility] Staff: Please measure patient's feet due to bunion deformity.  [facility] [address] [place], [place] [address] [phone]

## 2023-01-30 NOTE — Progress Notes (Unsigned)
  Subjective:  Patient ID: [name], female    DOB: 1941-05-22,  MRN: [id number]  April EARNEY presents to clinic today for at risk foot care. Patient has h/o PAD with h/o amputation(s) of left second digit  Chief Complaint  Patient presents with   Nail Problem    [place] PCP-[facility]  PCP VST-12/2022   New problem(s): None.   PCP is [facility].  No Known Allergies  Review of Systems: Negative except as noted in the HPI.  Objective: No changes noted in today's physical examination. There were no vitals filed for this visit. [name] is a pleasant 82 y.o. female in NAD. AAO x 3. Vascular CFT <3 seconds b/l LE. Palpable DP/PT pulses b/l LE. Digital hair absent b/l. Skin temperature gradient WNL b/l. No pain with calf compression b/l. +1 pitting edema b/l LE. Slight warmth. No fluctuance, no ascending cellulitis. No cyanosis or clubbing noted b/l LE.  Neurologic Normal speech. Oriented to person, place, and time. Protective sensation intact 5/5 intact bilaterally with 10g monofilament b/l.  Dermatologic Pedal integument with normal turgor, texture and tone BLE. No open wounds b/l LE. No interdigital macerations noted b/l LE. Toenails left great toe, right 2nd and 3rd toe, bilateral 4th toes, bilateral 5th toes, elongated, discolored, dystrophic, thickened, and crumbly with subungual debris and tenderness to dorsal palpation. Anonychia right great toe, left 3rd toe, right 2nd toe. No hyperkeratotic nor porokeratotic lesions present on today's visit.   Orthopedic: Muscle strength 5/5 to all lower extremity muscle groups bilaterally. Lower extremity amputation(s): digital amputation L 2nd toe.   Radiographs: None  Assessment/Plan: 1. Pain due to onychomycosis of toenails of both feet   2. Status post amputation of lesser toe of left foot ([place])   3. PVD (peripheral vascular disease) ([place])     -Consent given for treatment as described below: -Examined  patient. -Toenails were debrided in length and girth 3-5 right foot, L hallux, left fourth digit, and L 5th toe with sterile nail nippers and dremel without iatrogenic bleeding.  -Patient/POA to call should there be question/concern in the interim.   Return in about 3 months (around 05/02/2023).  Marzetta Board, DPM

## 2023-02-03 ENCOUNTER — Encounter (HOSPITAL_COMMUNITY): Payer: Self-pay | Admitting: Physician Assistant

## 2023-02-03 NOTE — Pre-Procedure Instructions (Signed)
Surgical Instructions    Your procedure is scheduled on February 05, 2023.  Report to [facility] Main Entrance "A" at 9:30 A.M., then check in with the Admitting office.  Call this number if you have problems the morning of surgery:  [phone]  If you have any questions prior to your surgery date call [phone]: Open Monday-Friday 8am-4pm If you experience any cold or flu symptoms such as cough, fever, chills, shortness of breath, etc. between now and your scheduled surgery, please notify us at the above number.     Remember:  Do not eat after midnight the night before your surgery  You may drink clear liquids until 8:30 AM the morning of your surgery.   Clear liquids allowed are: Water, Non-Citrus Juices (without pulp), Carbonated Beverages, Clear Tea, Black Coffee Only (NO MILK, CREAM OR POWDERED CREAMER of any kind), and Gatorade.     Take these medicines the morning of surgery with A SIP OF WATER:  citalopram (CELEXA)   famotidine (PEPCID)   pantoprazole (PROTONIX)   rosuvastatin (CRESTOR)   Tiotropium Bromide Monohydrate (SPIRIVA RESPIMAT)    May take these medicines IF NEEDED:  acetaminophen (TYLENOL)   albuterol (PROVENTIL HFA;VENTOLIN HFA) inhaler   azelastine (ASTELIN) nasal spray   budesonide-formoterol (SYMBICORT) inhaler   EPINEPHrine   fluticasone (FLONASE) nasal spray loperamide (IMODIUM A-D)     Follow your surgeon's instructions on when to stop Aspirin.  If no instructions were given by your surgeon then you will need to call the office to get those instructions.    As of today, STOP taking any Aleve, Naproxen, Ibuprofen, Motrin, Advil, Goody's, BC's, all herbal medications, fish oil, and all vitamins. This includes your medication: meloxicam (MOBIC)   You can continue to take your Potassium Supplement up until the day before surgery. DO NOT take any the morning of surgery.                     Do NOT Smoke (Tobacco/Vaping) for 24 hours prior to your  procedure.  If you use a CPAP at night, you may bring your mask/headgear for your overnight stay.   Contacts, glasses, piercing's, hearing aid's, dentures or partials may not be worn into surgery, please bring cases for these belongings.    For patients admitted to the hospital, discharge time will be determined by your treatment team.   Patients discharged the day of surgery will not be allowed to drive home, and someone needs to stay with them for 24 hours.  SURGICAL WAITING ROOM VISITATION Patients having surgery or a procedure may have no more than 2 support people in the waiting area - these visitors may rotate.   Children under the age of 14 must have an adult with them who is not the patient. If the patient needs to stay at the hospital during part of their recovery, the visitor guidelines for inpatient rooms apply. Pre-op nurse will coordinate an appropriate time for 1 support person to accompany patient in pre-op.  This support person may not rotate.   Please refer to the [facility] website for the visitor guidelines for Inpatients (after your surgery is over and you are in a regular room).    Special instructions:   [place]- Preparing For Surgery  Before surgery, you can play an important role. Because skin is not sterile, your skin needs to be as free of germs as possible. You can reduce the number of germs on your skin by washing with CHG (  chlorahexidine gluconate) Soap before surgery.  CHG is an antiseptic cleaner which kills germs and bonds with the skin to continue killing germs even after washing.    Oral Hygiene is also important to reduce your risk of infection.  Remember - BRUSH YOUR TEETH THE MORNING OF SURGERY WITH YOUR REGULAR TOOTHPASTE  Please do not use if you have an allergy to CHG or antibacterial soaps. If your skin becomes reddened/irritated stop using the CHG.  Do not shave (including legs and underarms) for at least 48 hours prior to first CHG shower. It  is OK to shave your face.  Please follow these instructions carefully.   Shower the NIGHT BEFORE SURGERY and the MORNING OF SURGERY  If you chose to wash your hair, wash your hair first as usual with your normal shampoo.  After you shampoo, rinse your hair and body thoroughly to remove the shampoo.  Use CHG Soap as you would any other liquid soap. You can apply CHG directly to the skin and wash gently with a scrungie or a clean washcloth.   Apply the CHG Soap to your body ONLY FROM THE NECK DOWN.  Do not use on open wounds or open sores. Avoid contact with your eyes, ears, mouth and genitals (private parts). Wash Face and genitals (private parts)  with your normal soap.   Wash thoroughly, paying special attention to the area where your surgery will be performed.  Thoroughly rinse your body with warm water from the neck down.  DO NOT shower/wash with your normal soap after using and rinsing off the CHG Soap.  Pat yourself dry with a CLEAN TOWEL.  Wear CLEAN PAJAMAS to bed the night before surgery  Place CLEAN SHEETS on your bed the night before your surgery  DO NOT SLEEP WITH PETS.   Day of Surgery: Take a shower with CHG soap. Do not wear jewelry or makeup Do not wear lotions, powders, perfumes/colognes, or deodorant. Do not shave 48 hours prior to surgery.  Men may shave face and neck. Do not bring valuables to the hospital.  [facility] is not responsible for any belongings or valuables. Do not wear nail polish, gel polish, artificial nails, or any other type of covering on natural nails (fingers and toes) If you have artificial nails or gel coating that need to be removed by a nail salon, please have this removed prior to surgery. Artificial nails or gel coating may interfere with anesthesia's ability to adequately monitor your vital signs. Wear Clean/Comfortable clothing the morning of surgery Remember to brush your teeth WITH YOUR REGULAR TOOTHPASTE.   Please read over  the following fact sheets that you were given.    If you received a COVID test during your pre-op visit  it is requested that you wear a mask when out in public, stay away from anyone that may not be feeling well and notify your surgeon if you develop symptoms. If you have been in contact with anyone that has tested positive in the last 10 days please notify you surgeon.

## 2023-02-03 NOTE — Progress Notes (Signed)
Anesthesia Chart Review:  82 year old female for laparoscopic cholecystectomy 02/05/2023 with Dr. Redmond Pulling.  Patient recently evaluated by hematology for leukopenia and normocytic normochromic anemia.  Concern for myelodysplasia, however she was not felt to be a good candidate for any treatment.  She underwent bone marrow biopsy on 01/28/2023 which showed bone marrow hypercellular for age with mild dyspoietic changes which were not considered specifically diagnostic of myeloid neoplasm.  At that time was stable with WBC 3.1 and hemoglobin 10.9.  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  Nuclear stress 2016 showed normal perfusion.  Echocardiogram 2018 showed normal LV function, mild LVH, grade 1 DD, mild tricuspid regurgitation.  Last seen by Dr. [name] 09/24/2021 noted to be stable at that time.  No changes to management.  History of COPD/asthma, maintained on Spiriva Respimat, Symbicort, Singulair, as needed albuterol.   TTE 03/26/2018: - Left ventricle: The cavity size was normal. Wall thickness was    increased in a pattern of mild LVH. Systolic function was normal.    The estimated ejection fraction was in the range of 55% to 60%.    Wall motion was normal; there were no regional wall motion    abnormalities. Doppler parameters are consistent with abnormal    left ventricular relaxation (grade 1 diastolic dysfunction).  - Aortic valve: Mildly calcified annulus. Trileaflet.  - Mitral valve: Mildly thickened leaflets .  - Right ventricle: The cavity size was mildly dilated. Wall    thickness was normal. Systolic function was low normal.  - Right atrium: The atrium was mildly dilated.  - Tricuspid valve: There was mild regurgitation.  - Pulmonary arteries: PA peak pressure: 36 mm Hg (S).   Nuclear stress 12/28/2014: Impression Exercise Capacity:  Lexiscan with no exercise. BP Response:  Normal blood pressure  response. Clinical Symptoms:  No significant symptoms noted. ECG Impression:  No significant ECG changes with Lexiscan. Comparison with Prior Nuclear Study: No images to compare     Overall Impression:  Normal stress nuclear study.

## 2023-02-04 ENCOUNTER — Encounter (HOSPITAL_COMMUNITY): Payer: Self-pay

## 2023-02-04 ENCOUNTER — Telehealth: Payer: Self-pay | Admitting: Cardiology

## 2023-02-04 ENCOUNTER — Ambulatory Visit: Payer: Self-pay | Admitting: General Surgery

## 2023-02-04 ENCOUNTER — Other Ambulatory Visit: Payer: Self-pay

## 2023-02-04 ENCOUNTER — Encounter (HOSPITAL_COMMUNITY)
Admission: RE | Admit: 2023-02-04 | Discharge: 2023-02-04 | Disposition: A | Discharge status: Home / Self Care | Payer: Medicare PPO | Source: Ambulatory Visit | Attending: General Surgery | Admitting: General Surgery

## 2023-02-04 VITALS — BP 109/61 | HR 42 | Temp 97.6°F | Resp 17 | Ht 64.0 in | Wt 217.0 lb

## 2023-02-04 DIAGNOSIS — Z01818 Encounter for other preprocedural examination: Secondary | ICD-10-CM | POA: Insufficient documentation

## 2023-02-04 DIAGNOSIS — I10 Essential (primary) hypertension: Secondary | ICD-10-CM | POA: Insufficient documentation

## 2023-02-04 LAB — BASIC METABOLIC PANEL
Anion gap: 9 (ref 5–15)
BUN: 13 mg/dL (ref 8–23)
CO2: 29 mmol/L (ref 22–32)
Calcium: 8.8 mg/dL — ABNORMAL LOW (ref 8.9–10.3)
Chloride: 102 mmol/L (ref 98–111)
Creatinine, Ser: 1.02 mg/dL — ABNORMAL HIGH (ref 0.44–1.00)
GFR, Estimated: 56 mL/min — ABNORMAL LOW (ref >60)
Glucose, Bld: 94 mg/dL (ref 70–99)
Potassium: 3.8 mmol/L (ref 3.5–5.1)
Sodium: 140 mmol/L (ref 135–145)

## 2023-02-04 LAB — CBC
HCT: 38.3 % (ref 36.0–46.0)
Hemoglobin: 11.7 g/dL — ABNORMAL LOW (ref 12.0–15.0)
MCH: 26.4 pg (ref 26.0–34.0)
MCHC: 30.5 g/dL (ref 30.0–36.0)
MCV: 86.5 fL (ref 80.0–100.0)
Platelets: 165 10*3/uL (ref 150–400)
RBC: 4.43 MIL/uL (ref 3.87–5.11)
RDW: 13.7 % (ref 11.5–15.5)
WBC: 3.1 10*3/uL — ABNORMAL LOW (ref 4.0–10.5)
nRBC: 0 % (ref 0.0–0.2)

## 2023-02-04 LAB — SURGICAL PCR SCREEN
MRSA, PCR: NEGATIVE
Staphylococcus aureus: NEGATIVE

## 2023-02-04 MED ORDER — SPY AGENT GREEN - (INDOCYANINE FOR INJECTION): 1.2500 mg | Freq: Once | INTRAMUSCULAR | Status: AC

## 2023-02-04 NOTE — Telephone Encounter (Signed)
[name], from [place] called back and said she did not have pt down as needing cardiac clearance. She did say that it looks like Charolotte Capuchin may have called our office about clearance. [name] said she will have Shamera call me about the clearance.

## 2023-02-04 NOTE — Telephone Encounter (Signed)
    Primary Cardiologist:Brian [name], MD  Chart reviewed as part of pre-operative protocol coverage. Because of [name]'s past medical history and time since last visit, he/she will require a follow-up office visit in order to better assess preoperative cardiovascular risk.  Pre-op covering staff: - Please schedule appointment and call patient to inform them. - Please contact requesting surgeon's office via preferred method (i.e, phone, fax) to inform them of need for appointment prior to surgery.  If applicable, this message will also be routed to pharmacy pool and/or primary cardiologist for input on holding anticoagulant/antiplatelet agent as requested below so that this information is available at time of patient's appointment.   Deberah Pelton, NP  02/04/2023, 2:52 PM

## 2023-02-04 NOTE — Telephone Encounter (Signed)
I called surgeon office back and left message for surgery scheduler [name], I do not see a clearance request in the chart. I am not sure when clearance was faxed to Dr. [name]. I left message for [name] to call me back at [phone]. I am able to gather information from epic but I need a call back to confirm.      Pre-operative Risk Assessment    Patient Name: [name]  DOB: 1941-05-03 MRN: [id number]      Request for Surgical Clearance    Procedure:  LAPAROSCOPIC CHOLECYSTECTOMY WITH ICG DYE & INTRAOPERATIVE CHOLANGIOGRAM  Date of Surgery:  Clearance 02/05/23                                 Surgeon:  DR. Greer Pickerel Surgeon's Group or Practice Name:  [facility]  Phone number:  [phone] Fax number:  [phone]   Type of Clearance Requested:   - Medical ; ASA   5. What type of anesthesia will be used?  Press F2 and select the anesthesia to be used for the procedure.  :1}  Type of Anesthesia:  General    Additional requests/questions:    [name]   02/04/2023, 2:01 PM

## 2023-02-04 NOTE — Telephone Encounter (Signed)
Mickel Baas, RN with [facility] called me back. Mickel Baas stated the pt went for PAT which at that time their office was told they need to get cardiac clearance. I stated the anesthesiologist most likely is requesting clearance. Generally if a pt is seen by cardiology or is on blood thinners the anesthesiologist generally will require cardiac clearance. I did tell Mickel Baas that the pt has not been seen since 08/2021 and was to f/u 1 yr after that appt and did not. I informed Mickel Baas that I did send this to pre op pool, however since we have not seen the pt since 2022 clearance probably will not be given until the pt has been seen. Mickel Baas verbalized understanding to this. I did tell her that we will let them know either way if pt has been cleared or will need an appt.

## 2023-02-04 NOTE — Progress Notes (Addendum)
Received phone call from [facility], [phone]. Patient has not been seen by Cardiology in over a year and is not cleared for surgery.  Karoline Caldwell, PA-C made aware.

## 2023-02-04 NOTE — Progress Notes (Deleted)
PCP - [facility] Cardiologist - Dr. [name]  PPM/ICD - Denies  Chest x-ray - 10/30/2022 EKG - today at PAT 02/04/2023 Stress Test - 12/28/2014 ECHO - 03/26/2018 Cardiac Cath - 01/2004  Sleep Study - Denies CPAP - Denies  Non-diabetic  Blood Thinner Instructions: Denies Aspirin Instructions:Last dose was 02/01/2023  ERAS Protcol -Yes, clear liquids 3 hours prior to surgery PRE-SURGERY Ensure or G2- No  COVID TEST- Denies   Anesthesia review: yes, Karoline Caldwell is reviewing for Cardiac clearance.   Patient denies shortness of breath, fever, cough and chest pain at PAT appointment   All instructions explained to the patient, with a verbal understanding of the material. Patient agrees to go over the instructions while at home for a better understanding. Patient also instructed to self quarantine after being tested for COVID-19. The opportunity to ask questions was provided.

## 2023-02-04 NOTE — Telephone Encounter (Signed)
[facility] called stating that they had sent a fax over for the pt. The pt is scheduled for surgery tomorrow morning and they needed to make sure we received the fax. Please advise

## 2023-02-04 NOTE — Telephone Encounter (Signed)
Pt has been scheduled to see Coletta Memos, FNP 02/06/23 @ 10:30 at [facility] location. I will update all parties involved.

## 2023-02-04 NOTE — Telephone Encounter (Signed)
I will send FYI to surgeon's office the pt is going to need an appt in office as we have note seen the pt since 2022. Procedure will need to be post poned until the pt has been cleared by cardiology.

## 2023-02-04 NOTE — Progress Notes (Signed)
PCP - [facility] Cardiologist - Dr [name]  PPM/ICD - Denies  Chest x-ray - 10/30/2022 EKG - Today at PAT, 02/04/2023 Stress Test - 12/28/2014 ECHO - 03/26/2018 Cardiac Cath - 01/2004  Sleep Study - Denies CPAP - Denies  Non-diabetic Last dose of GLP1 agonist-  Non-diabetic  Blood Thinner Instructions:Denies Aspirin Instructions:Last dose was 02/01/2023  ERAS Protcol - Yes, clear liquids until 3 hours prior to surgery PRE-SURGERY Ensure or G2- No  COVID TEST- Denies   Anesthesia review: yes, Karoline Caldwell is reviewing re: cardiac clearance. Last visit was 08/2022  Patient denies shortness of breath, fever, cough and chest pain at PAT appointment   All instructions explained to the patient, with a verbal understanding of the material. Patient agrees to go over the instructions while at home for a better understanding. Patient also instructed to self quarantine after being tested for COVID-19. The opportunity to ask questions was provided.

## 2023-02-05 ENCOUNTER — Ambulatory Visit (HOSPITAL_COMMUNITY): Admission: RE | Admit: 2023-02-05 | Payer: Medicare PPO | Source: Home / Self Care | Admitting: General Surgery

## 2023-02-05 SURGERY — LAPAROSCOPIC CHOLECYSTECTOMY WITH INTRAOPERATIVE CHOLANGIOGRAM
Anesthesia: General

## 2023-02-05 NOTE — Progress Notes (Signed)
Cardiology Clinic Note   Patient Name: [name] Date of Encounter: 02/06/2023  Primary Care Provider:  [facility] Cardiologist:  Kirk Ruths, MD  Patient Profile    [name] 82 year old female presents to the clinic today for follow-up evaluation of her coronary artery disease and preoperative cardiac evaluation.  Past Medical History    Past Medical History:  Diagnosis Date   Allergic rhinitis    Arnold-Chiari malformation (HCC)    Asthma    CAD (coronary artery disease)    COPD (chronic obstructive pulmonary disease) (HCC)    Coughing    coughing since last 01-22-2019 , started on cefdinir bid x10 days, has 3 pills left today , reports she feels mcuh better , still coughing copiuus amonts of thick white sputum , denies fever nor chills, nor body aches .    DJD (degenerative joint disease)    GERD (gastroesophageal reflux disease)    Hiatal hernia    History of absence seizures    last confirmed seizure age 59     Hx of colonic polyps    Hypercholesteremia    Hypertension    Obesity    Positive PPD    Reflex sympathetic dystrophy    Sleep apnea    Varicose veins    Venous insufficiency    Past Surgical History:  Procedure Laterality Date   ABDOMINAL HYSTERECTOMY     cspine surgery  1993   for arnold-chiari malformation   IR BONE MARROW BIOPSY & ASPIRATION  01/28/2023   JOINT REPLACEMENT  06/05/11   Left total knee replacement   right knee arthroscopy  12/2007   Dr. Tonita Cong   right total knee replacement  05/2008   Dr. Tonita Cong   TOTAL KNEE REVISION Left 02/03/2019   Procedure: LEFT TOTAL KNEE REVISION;  Surgeon: Rod Can, MD;  Location: [facility];  Service: Orthopedics;  Laterality: Left;    Allergies  No Known Allergies  History of Present Illness    [name] is a PMH of HTN, HLD, coronary atherosclerosis, diastolic CHF, asthma, COPD, GERD, degenerative lumbar disc, AKI, cardiac murmur, generalized anxiety  disorder, total knee replacement, shingles, vertigo, anemia, cough, generalized weakness, and pain of upper abdomen.  Cardiac catheterization 2/05 showed mild irregularities in left main, 40% proximal LAD, 30% distal LAD lesion, first diagonal 30-40% proximal lesion, ostial LAD 60%, and nonobstructive circumflex and RCA.  She was noted to have normal LV function.  She underwent stress testing 1/16 which showed an EF of 58% and normal perfusion.  Echocardiogram 4/19 showed normal LV function mild left ventricular hypertrophy, G1 DD, mild right atrial and right ventricular enlargement, and mild tricuspid regurgitation.  Her venous Dopplers 11/21 showed no DVT.  She was seen in follow-up by Dr. [name] on 09/24/2021.  During that time she denied chest discomfort, dyspnea, palpitations, and syncope.  She was noted to have chronic mild pedal edema.  She presents to the clinic today for follow-up evaluation of her coronary artery disease and preoperative cardiac evaluation.  She states her surgery was canceled because she had not follow-up with cardiology.  Her EKG today shows sinus bradycardia 44 bpm.  She comes in wearing her support stockings today.  She does have slight ankle edema 1+ pitting.  She is limited in her physical activity due to her leg pain.  Her blood pressure has been well-controlled.  We reviewed the importance of fluid restriction and she expressed understanding.  I will  increase her Lasix for the next 2 days as well as her potassium, give the salty 6 diet she and plan follow-up in 6 months.  Today she denies chest pain, increased shortness of breath, lower extremity edema, fatigue, palpitations, melena, hematuria, hemoptysis, diaphoresis, weakness, presyncope, syncope, orthopnea, and PND.    Home Medications    Prior to Admission medications   Medication Sig Start Date End Date Taking? Authorizing Provider  acetaminophen (TYLENOL) 500 MG tablet Take 1,000 mg by mouth every 6 (six)  hours as needed for moderate pain.    [provider]  albuterol (PROVENTIL HFA;VENTOLIN HFA) 108 (90 BASE) MCG/ACT inhaler Inhale 2 puffs into the lungs every 4 (four) hours as needed. For wheeze or shortness of breath 03/26/12   Noralee Space, MD  alendronate (FOSAMAX) 70 MG tablet TAKE 1 TABLET BY MOUTH ONCE WEEKLY BEFORE THE FIRST FOOD, BEVERAGE OR MEDICINE OF THE DAY WITH PLAIN WATER 11/07/22   Biagio Borg, MD  aspirin 81 MG tablet Take 81 mg by mouth daily.    [provider]  azelastine (ASTELIN) 0.1 % nasal spray Place 1 spray into both nostrils 2 (two) times daily as needed for rhinitis or allergies. 11/07/18   [provider]  budesonide-formoterol (SYMBICORT) 80-4.5 MCG/ACT inhaler Inhale 2 puffs into the lungs 2 (two) times daily as needed (shortness of breath).    [provider]  citalopram (CELEXA) 10 MG tablet Take 1 tablet (10 mg total) by mouth daily. Patient taking differently: Take 20 mg by mouth daily. 05/07/21 02/02/23  Biagio Borg, MD  [employer] D3 50 MCG (2000 UT) CAPS Take 1 capsule by mouth daily. 01/09/21   [provider]  donepezil (ARICEPT ODT) 5 MG disintegrating tablet Take 1 tablet (5 mg total) by mouth at bedtime. Patient not taking: Reported on 02/02/2023 05/07/21   Biagio Borg, MD  donepezil (ARICEPT) 5 MG tablet Take 5 mg by mouth at bedtime.    [provider]  EPINEPHrine 0.3 mg/0.3 mL IJ SOAJ injection Inject 0.3 mg into the muscle as needed for anaphylaxis.    [provider]  famotidine (PEPCID) 20 MG tablet TAKE 1 TABLET BY MOUTH TWICE A DAY 11/21/22   Biagio Borg, MD  ferrous sulfate 325 (65 FE) MG EC tablet Take 325 mg by mouth daily.    [provider]  fluticasone (FLONASE) 50 MCG/ACT nasal spray Place 1 spray into both nostrils 2 (two) times daily as needed for allergies or rhinitis.    [provider]  furosemide (LASIX) 40 MG tablet TAKE 2 TABLETS BY MOUTH EVERY DAY NEED OFFICE  VISIT 12/29/22   Biagio Borg, MD  lidocaine (LIDODERM) 5 % Place 1 patch onto the skin daily. Remove & Discard patch within 12 hours or as directed by MD Patient taking differently: Place 1 patch onto the skin daily as needed (pain). Remove & Discard patch within 12 hours or as directed by MD 10/30/22   Dorie Rank, MD  loperamide (IMODIUM A-D) 2 MG tablet Take 2 mg by mouth as needed for diarrhea or loose stools.    [provider]  meloxicam (MOBIC) 15 MG tablet TAKE ONE TABLET BY MOUTH daily AS NEEDED FOR PAIN 01/15/23   Biagio Borg, MD  montelukast (SINGULAIR) 10 MG tablet TAKE 1 TABLET BY MOUTH EVERY DAY IN THE EVENING 11/27/22   Biagio Borg, MD  pantoprazole (PROTONIX) 40 MG tablet TAKE 1 TABLET BY MOUTH EVERY DAY  05/20/22   Biagio Borg, MD  potassium chloride SA (KLOR-CON M20) 20 MEQ tablet Take 1 tablet (20 mEq total) by mouth 2 (two) times daily. Patient taking differently: Take 20-40 mEq by mouth See admin instructions. Take 20 meq twice daily. May increase to 40 meq twice daily as needed for increased fluid retention 05/07/21   Biagio Borg, MD  rosuvastatin (CRESTOR) 40 MG tablet Take 1 tablet (40 mg total) by mouth daily. 05/07/21 02/02/23  Biagio Borg, MD  Tiotropium Bromide Monohydrate (SPIRIVA RESPIMAT) 1.25 MCG/ACT AERS Inhale 1.25 mcg into the lungs daily.    [provider]  tiZANidine (ZANAFLEX) 2 MG tablet Take 1 tablet (2 mg total) by mouth every 6 (six) hours as needed for muscle spasms. Patient not taking: Reported on 02/02/2023 06/07/21   Biagio Borg, MD  traMADol (ULTRAM) 50 MG tablet TAKE 1 TABLET BY MOUTH EVERY 6 HOURS AS NEEDED. Patient not taking: Reported on 02/02/2023 06/15/22   Biagio Borg, MD  traZODone (DESYREL) 100 MG tablet TAKE 1 TABLET BY MOUTH EVERYDAY AT BEDTIME 08/20/22   Biagio Borg, MD    Family History    Family History  Problem Relation Age of Onset   Heart disease Mother    Stroke Mother    Stroke Father    Clotting disorder  Father    Colon cancer Neg Hx    Stomach cancer Neg Hx    She indicated that her mother is deceased. She indicated that her father is alive. She indicated that the status of her neg hx is unknown.  Social History    Social History   Socioeconomic History   Marital status: Widowed    Spouse name: Not on file   Number of children: 2   Years of education: 54   Highest education level: Not on file  Occupational History   Occupation: retired  Tobacco Use   Smoking status: Former    Packs/day: 0.30    Years: 12.00    Total pack years: 3.60    Types: Cigarettes    Quit date: 01/02/2012    Years since quitting: 11.1   Smokeless tobacco: Never   Tobacco comments:    1 pack per week  Vaping Use   Vaping Use: Never used  Substance and Sexual Activity   Alcohol use: No   Drug use: No   Sexual activity: Not on file  Other Topics Concern   Not on file  Social History Narrative   Fun/Hobby: Playing cards and mingling.   Denies abuse and feels safe at home.    Social Determinants of Health   Financial Resource Strain: Not on file  Food Insecurity: Not on file  Transportation Needs: Not on file  Physical Activity: Not on file  Stress: Not on file  Social Connections: Not on file  Intimate Partner Violence: Not on file     Review of Systems    General:  No chills, fever, night sweats or weight changes.  Cardiovascular:  No chest pain, dyspnea on exertion, edema, orthopnea, palpitations, paroxysmal nocturnal dyspnea. Dermatological: No rash, lesions/masses Respiratory: No cough, dyspnea Urologic: No hematuria, dysuria Abdominal:   No nausea, vomiting, diarrhea, bright red blood per rectum, melena, or hematemesis Neurologic:  No visual changes, wkns, changes in mental status. All other systems reviewed and are otherwise negative except as noted above.  Physical Exam    VS:  BP 112/63   Pulse (!) 44   Ht '5\' 4"'$  (  1.626 m)   Wt 221 lb 3.2 oz (100.3 kg)   BMI 37.97 kg/m  ,  BMI Body mass index is 37.97 kg/m. GEN: Well nourished, well developed, in no acute distress. HEENT: normal. Neck: Supple, no JVD, carotid bruits, or masses. Cardiac: RRR, no murmurs, rubs, or gallops. No clubbing, cyanosis, ankle edema 1+.  Radials/DP/PT 2+ and equal bilaterally.  Respiratory:  Respirations regular and unlabored, clear to auscultation bilaterally. GI: Soft, nontender, nondistended, BS + x 4. MS: no deformity or atrophy. Skin: warm and dry, no rash. Neuro:  Strength and sensation are intact. Psych: Normal affect.  Accessory Clinical Findings    Recent Labs: 12/20/2022: ALT 7; TSH 2.113 02/04/2023: BUN 13; Creatinine, Ser 1.02; Hemoglobin 11.7; Platelets 165; Potassium 3.8; Sodium 140   Recent Lipid Panel    Component Value Date/Time   CHOL 151 06/18/2022 1143   CHOL 144 11/07/2019 1605   TRIG 74.0 06/18/2022 1143   HDL 58.30 06/18/2022 1143   HDL 68 11/07/2019 1605   CHOLHDL 3 06/18/2022 1143   VLDL 14.8 06/18/2022 1143   LDLCALC 78 06/18/2022 1143   LDLCALC 57 11/07/2019 1605         ECG personally reviewed by me today-sinus bradycardia 44 bpm- No acute changes  Echocardiogram 03/26/2018  Study Conclusions   - Left ventricle: The cavity size was normal. Wall thickness was    increased in a pattern of mild LVH. Systolic function was normal.    The estimated ejection fraction was in the range of 55% to 60%.    Wall motion was normal; there were no regional wall motion    abnormalities. Doppler parameters are consistent with abnormal    left ventricular relaxation (grade 1 diastolic dysfunction).  - Aortic valve: Mildly calcified annulus. Trileaflet.  - Mitral valve: Mildly thickened leaflets .  - Right ventricle: The cavity size was mildly dilated. Wall    thickness was normal. Systolic function was low normal.  - Right atrium: The atrium was mildly dilated.  - Tricuspid valve: There was mild regurgitation.  - Pulmonary arteries: PA peak pressure: 36  mm Hg (S).   -------------------------------------------------------------------  Study data:   Study status:  Routine.  Procedure:  The patient  reported no pain pre or post test. Transthoracic echocardiography  for left ventricular function evaluation, for right ventricular  function evaluation, and for assessment of valvular function. Image  quality was adequate.  Study completion:  There were no  complications.          Transthoracic echocardiography.  M-mode,  complete 2D, spectral Doppler, and color Doppler.  Birthdate:  Patient birthdate: 07-12-1941.  Age:  Patient is 82 yr old.  Sex:  Gender: female.    BMI: 37.8 kg/m^2.  Patient status:  Outpatient.  Study date:  Study date: 03/26/2018. Study time: 09:42 AM.  Location:  [facility]   -------------------------------------------------------------------   -------------------------------------------------------------------  Left ventricle:  The cavity size was normal. Wall thickness was  increased in a pattern of mild LVH. Systolic function was normal.  The estimated ejection fraction was in the range of 55% to 60%.  Wall motion was normal; there were no regional wall motion  abnormalities. Doppler parameters are consistent with abnormal left  ventricular relaxation (grade 1 diastolic dysfunction). There was  no evidence of elevated ventricular filling pressure by Doppler  parameters.   -------------------------------------------------------------------  Aortic valve:   Mildly calcified annulus. Trileaflet.  Doppler:  There was no stenosis.   There was  no regurgitation.   -------------------------------------------------------------------  Aorta: Aortic root: The aortic root was normal in size.   -------------------------------------------------------------------  Mitral valve:   Mildly thickened leaflets .  Doppler:  There was  trivial regurgitation.    Peak gradient (D): 4 mm Hg.    -------------------------------------------------------------------  Left atrium:  The atrium was normal in size.   -------------------------------------------------------------------  Atrial septum:  No defect or patent foramen ovale was identified.    -------------------------------------------------------------------  Right ventricle:  The cavity size was mildly dilated. Wall  thickness was normal. Systolic function was low normal.   -------------------------------------------------------------------  Pulmonic valve:    The valve appears to be grossly normal.  Doppler:  There was no significant regurgitation.   -------------------------------------------------------------------  Tricuspid valve:   Structurally normal valve.   Leaflet separation  was normal.  Doppler:  There was mild regurgitation.   -------------------------------------------------------------------  Right atrium:  The atrium was mildly dilated.   -------------------------------------------------------------------  Pericardium: There was no pericardial effusion.   -------------------------------------------------------------------  Systemic veins:  Inferior vena cava: The vessel was normal in size. The  respirophasic diameter changes were in the normal range (>= 50%),  consistent with normal central venous pressure.    Assessment & Plan   1.  Coronary artery disease-no chest pain today.  Underwent cardiac catheterization 2005 which showed mild irregularities in left main and nonobstructive CAD.  Nuclear stress testing 1/16 showed EF of 58% normal perfusion. Continue aspirin, rosuvastatin Heart healthy low-sodium diet-salty 6 given Increase physical activity as tolerated  Bilateral lower extremity edema-1+ ankle edema.  Reports drinking an increased amount of fluid. Fluid restriction-64 ounces or less Continue lower extremity support stockings Increase Lasix to 80 mg x 2 days then resume 40 mg  dosing Increase potassium to 40 mill equivalents twice daily x 2 days then resume 20 mill equivalent dosing  Hyperlipidemia- 06/18/2022: Cholesterol 151; HDL 58.30; LDL Cholesterol 78; Triglycerides 74.0; VLDL 14.8 Continue aspirin, rosuvastatin Heart healthy low-sodium high-fiber diet Increase physical activity as tolerated Repeat lipids and lft's 7/24  Essential hypertension-BP 112/63 Continue current medical therapy Heart healthy low-sodium diet Increase physical activity as tolerated  Preoperative cardiac evaluation-laparoscopic cholecystectomy with ICG dye and interoperative cholangiogram, Dr. Greer Pickerel, [place]  Primary Cardiologist: Kirk Ruths, MD  Chart reviewed as part of pre-operative protocol coverage. Given past medical history and time since last visit, based on ACC/AHA guidelines, [name] would be at acceptable risk for the planned procedure without further cardiovascular testing.   Patient was advised that if she develops new symptoms prior to surgery to contact our office to arrange a follow-up appointment.  She verbalized understanding.  Her RCRI is a class II risk, 0.9% risk of major cardiac event.  She is able to complete greater than 4 METS of physical activity.  Her aspirin may be held for 5 to 7 days prior to her procedure.  Please resume as soon as hemostasis is achieved.  I will route this recommendation to the requesting party via Epic fax function and remove from pre-op pool.  Disposition: Follow-up with Dr. [name] in 6 months.   April Ng. Eulamae Greenstein NP-C     02/06/2023, 11:11 AM [place] [address] Office [phone] Fax [phone]    I spent 14 minutes examining this patient, reviewing medications, and using patient centered shared decision making involving her cardiac care.  Prior to her visit I spent greater than 20 minutes reviewing her past medical history,  medications, and prior  cardiac  tests.

## 2023-02-06 ENCOUNTER — Ambulatory Visit: Payer: Medicare PPO | Attending: General Practice | Admitting: General Practice

## 2023-02-06 ENCOUNTER — Encounter: Payer: Self-pay | Admitting: General Practice

## 2023-02-06 VITALS — BP 112/63 | HR 44 | Ht 64.0 in | Wt 221.2 lb

## 2023-02-06 DIAGNOSIS — J301 Allergic rhinitis due to pollen: Secondary | ICD-10-CM | POA: Diagnosis not present

## 2023-02-06 DIAGNOSIS — E785 Hyperlipidemia, unspecified: Secondary | ICD-10-CM

## 2023-02-06 DIAGNOSIS — I2510 Atherosclerotic heart disease of native coronary artery without angina pectoris: Secondary | ICD-10-CM | POA: Diagnosis not present

## 2023-02-06 DIAGNOSIS — I10 Essential (primary) hypertension: Secondary | ICD-10-CM

## 2023-02-06 DIAGNOSIS — Z01810 Encounter for preprocedural cardiovascular examination: Secondary | ICD-10-CM

## 2023-02-06 DIAGNOSIS — J3089 Other allergic rhinitis: Secondary | ICD-10-CM | POA: Diagnosis not present

## 2023-02-06 DIAGNOSIS — J3081 Allergic rhinitis due to animal (cat) (dog) hair and dander: Secondary | ICD-10-CM | POA: Diagnosis not present

## 2023-02-06 NOTE — Patient Instructions (Signed)
Medication Instructions:   INCREASE Lasix to 80 mg daily for 2 days (Saturday and Sunday), then resume 40 mg daily INCREASE Potassium to 40 meq 2 times a day for 2 days (Saturday and Sunday) then resume 20 meq 2 times a day   *If you need a refill on your cardiac medications before your next appointment, please call your pharmacy*  Lab Work: NONE ordered at this time of appointment   If you have labs (blood work) drawn today and your tests are completely normal, you will receive your results only by: [place] (if you have MyChart) OR A paper copy in the mail If you have any lab test that is abnormal or we need to change your treatment, we will call you to review the results.  Testing/Procedures: NONE ordered at this time of appointment   Follow-Up: At [facility], you and your health needs are our priority.  As part of our continuing mission to provide you with exceptional heart care, we have created designated Provider Care Teams.  These Care Teams include your primary Cardiologist (physician) and Advanced Practice Providers (APPs -  Physician Assistants and Nurse Practitioners) who all work together to provide you with the care you need, when you need it.   Your next appointment:   6 month(s)  Provider:   Kirk Ruths, MD     Other Instructions CONTINUE daily weights and log  Weigh yourself EVERY morning after you go to the bathroom but before you eat or drink anything. Write this number down in a weight log/diary. If you gain 3 pounds overnight or 5 pounds in a week, call the office. Take your medicines as prescribed. If you have concerns about your medications, please call us before you stop taking them.  Limit all fluids for the day to 64 fluid ounces or less. Fluid includes all drinks, coffee, juice, ice chips, soup, jello, and all other liquids.

## 2023-02-09 ENCOUNTER — Encounter (HOSPITAL_COMMUNITY): Payer: Self-pay | Admitting: Hematology and Oncology

## 2023-02-10 ENCOUNTER — Other Ambulatory Visit: Payer: Self-pay

## 2023-02-10 ENCOUNTER — Other Ambulatory Visit: Payer: Self-pay | Admitting: Internal Medicine

## 2023-02-10 ENCOUNTER — Inpatient Hospital Stay: Payer: Medicare PPO | Attending: Hematology and Oncology | Admitting: Hematology and Oncology

## 2023-02-10 VITALS — BP 122/76 | HR 43 | Temp 97.7°F | Resp 16 | Ht 64.0 in | Wt 216.1 lb

## 2023-02-10 DIAGNOSIS — I10 Essential (primary) hypertension: Secondary | ICD-10-CM | POA: Diagnosis not present

## 2023-02-10 DIAGNOSIS — D649 Anemia, unspecified: Secondary | ICD-10-CM | POA: Diagnosis not present

## 2023-02-10 DIAGNOSIS — I739 Peripheral vascular disease, unspecified: Secondary | ICD-10-CM | POA: Insufficient documentation

## 2023-02-10 DIAGNOSIS — I2510 Atherosclerotic heart disease of native coronary artery without angina pectoris: Secondary | ICD-10-CM | POA: Diagnosis not present

## 2023-02-10 DIAGNOSIS — D72819 Decreased white blood cell count, unspecified: Secondary | ICD-10-CM | POA: Insufficient documentation

## 2023-02-10 NOTE — Progress Notes (Signed)
[place] NOTE  Patient Care Team: [facility] as PCP - General [name], [name], MD as PCP - Cardiology (Cardiology) Susa Day, MD (Orthopedic Surgery) Lelon Perla, MD (Cardiology)  CHIEF COMPLAINTS/PURPOSE OF CONSULTATION:  Leukopenia and anemia  ASSESSMENT & PLAN:   This is a very pleasant 82 year-old female patient with past medical history significant for hypertension, dyslipidemia, coronary artery disease, peripheral vascular disease referred to hematology for evaluation and recommendations regarding leukopenia and normocytic normochromic anemia.  #1 Leukopenia, mild. Most recent labs with no evidence of clear abnormal morphology.  No clear evidence of iron deficiency, [id number] or folic acid deficiency.  ANA negative.  No evidence of acute hepatitis or hypothyroidism. BMB hypercellular but no frank evidence of MDS. Cytogenetics and FISH panel neg for MDS.  #2 normocytic normochromic anemia, on  oral iron supplementation Stable compared to last visit.  No evidence of nutritional deficiency or hemolysis. BMB with no major concerns except for hypercellularity.  At this time since there is no clear evidence of myelodysplasia or other bone marrow abnormalities and since the cytopenias are overall mild, we have recommended surveillance and she is agreeable to this. We will continue for FU in 3/4 month with repeat labs.   HISTORY OF PRESENTING ILLNESS:  [name] 82 y.o. female is here because of Leukopenia and Anemia.  This is a very pleasant 82 year old female patient with past medical history significant for coronary artery disease, COPD, hypertension, dyslipidemia peripheral vascular disease referred to hematology for progressive leukopenia and anemia.  She is here to review most recent labs.  She only reported some watery bowel movements and diarrhea which was her biggest symptom during her last visit.  Interval History She  is going to be scheduled for gall bladder surgery soon. She says she had gastroenterologist evaluation and they suggested cholecystectomy. Surgery postponed because of some heart issues, but she has followed up with cardiology recently. Other than the diarrhea, she denies any major issues.  All other systems were reviewed with the patient and are negative.  MEDICAL HISTORY:  Past Medical History:  Diagnosis Date   Allergic rhinitis    Arnold-Chiari malformation (HCC)    Asthma    CAD (coronary artery disease)    COPD (chronic obstructive pulmonary disease) (HCC)    Coughing    coughing since last 01-22-2019 , started on cefdinir bid x10 days, has 3 pills left today , reports she feels mcuh better , still coughing copiuus amonts of thick white sputum , denies fever nor chills, nor body aches .    DJD (degenerative joint disease)    GERD (gastroesophageal reflux disease)    Hiatal hernia    History of absence seizures    last confirmed seizure age 77     Hx of colonic polyps    Hypercholesteremia    Hypertension    Obesity    Positive PPD    Reflex sympathetic dystrophy    Sleep apnea    Varicose veins    Venous insufficiency     SURGICAL HISTORY: Past Surgical History:  Procedure Laterality Date   ABDOMINAL HYSTERECTOMY     cspine surgery  1993   for arnold-chiari malformation   IR BONE MARROW BIOPSY & ASPIRATION  01/28/2023   JOINT REPLACEMENT  06/05/11   Left total knee replacement   right knee arthroscopy  12/2007   Dr. Tonita Cong   right total knee replacement  05/2008   Dr. Tonita Cong  TOTAL KNEE REVISION Left 02/03/2019   Procedure: LEFT TOTAL KNEE REVISION;  Surgeon: Rod Can, MD;  Location: [facility];  Service: Orthopedics;  Laterality: Left;    SOCIAL HISTORY: Social History   Socioeconomic History   Marital status: Widowed    Spouse name: Not on file   Number of children: 2   Years of education: 54   Highest education level: Not on file  Occupational History    Occupation: retired  Tobacco Use   Smoking status: Former    Packs/day: 0.30    Years: 12.00    Total pack years: 3.60    Types: Cigarettes    Quit date: 01/02/2012    Years since quitting: 11.1   Smokeless tobacco: Never   Tobacco comments:    1 pack per week  Vaping Use   Vaping Use: Never used  Substance and Sexual Activity   Alcohol use: No   Drug use: No   Sexual activity: Not on file  Other Topics Concern   Not on file  Social History Narrative   Fun/Hobby: Playing cards and mingling.   Denies abuse and feels safe at home.    Social Determinants of Health   Financial Resource Strain: Not on file  Food Insecurity: Not on file  Transportation Needs: Not on file  Physical Activity: Not on file  Stress: Not on file  Social Connections: Not on file  Intimate Partner Violence: Not on file    FAMILY HISTORY: Family History  Problem Relation Age of Onset   Heart disease Mother    Stroke Mother    Stroke Father    Clotting disorder Father    Colon cancer Neg Hx    Stomach cancer Neg Hx     ALLERGIES:  has No Known Allergies.  MEDICATIONS:  Current Outpatient Medications  Medication Sig Dispense Refill   acetaminophen (TYLENOL) 500 MG tablet Take 1,000 mg by mouth every 6 (six) hours as needed for moderate pain.     albuterol (PROVENTIL HFA;VENTOLIN HFA) 108 (90 BASE) MCG/ACT inhaler Inhale 2 puffs into the lungs every 4 (four) hours as needed. For wheeze or shortness of breath 1 Inhaler 6   alendronate (FOSAMAX) 70 MG tablet TAKE ONE TABLET BY MOUTH ONCE WEEKLY ON WEDNESDAY 4 tablet 1   aspirin 81 MG tablet Take 81 mg by mouth daily.     azelastine (ASTELIN) 0.1 % nasal spray Place 1 spray into both nostrils 2 (two) times daily as needed for rhinitis or allergies.     budesonide-formoterol (SYMBICORT) 80-4.5 MCG/ACT inhaler Inhale 2 puffs into the lungs 2 (two) times daily as needed (shortness of breath).     citalopram (CELEXA) 10 MG tablet Take 1 tablet (10 mg  total) by mouth daily. (Patient taking differently: Take 20 mg by mouth daily.) 90 tablet 3   [employer] D3 50 MCG (2000 UT) CAPS Take 1 capsule by mouth daily.     donepezil (ARICEPT ODT) 5 MG disintegrating tablet Take 1 tablet (5 mg total) by mouth at bedtime. (Patient not taking: Reported on 02/06/2023) 90 tablet 3   donepezil (ARICEPT) 5 MG tablet Take 5 mg by mouth at bedtime.     EPINEPHrine 0.3 mg/0.3 mL IJ SOAJ injection Inject 0.3 mg into the muscle as needed for anaphylaxis.     famotidine (PEPCID) 20 MG tablet TAKE ONE TABLET BY MOUTH TWICE DAILY 60 tablet 1   ferrous sulfate 325 (65 FE) MG EC tablet Take 325 mg by mouth daily.  fluticasone (FLONASE) 50 MCG/ACT nasal spray Place 1 spray into both nostrils 2 (two) times daily as needed for allergies or rhinitis.     furosemide (LASIX) 40 MG tablet TAKE 2 TABLETS BY MOUTH EVERY DAY NEED OFFICE VISIT 180 tablet 2   lidocaine (LIDODERM) 5 % Place 1 patch onto the skin daily. Remove & Discard patch within 12 hours or as directed by MD (Patient taking differently: Place 1 patch onto the skin daily as needed (pain). Remove & Discard patch within 12 hours or as directed by MD) 7 patch 0   loperamide (IMODIUM A-D) 2 MG tablet Take 2 mg by mouth as needed for diarrhea or loose stools.     meloxicam (MOBIC) 15 MG tablet TAKE ONE TABLET BY MOUTH ONCE daily AS NEEDED FOR PAIN 30 tablet 2   montelukast (SINGULAIR) 10 MG tablet TAKE 1 TABLET BY MOUTH EVERY DAY IN THE EVENING 90 tablet 1   pantoprazole (PROTONIX) 40 MG tablet TAKE 1 TABLET BY MOUTH EVERY DAY 90 tablet 1   potassium chloride SA (KLOR-CON M20) 20 MEQ tablet Take 1 tablet (20 mEq total) by mouth 2 (two) times daily. (Patient taking differently: Take 20-40 mEq by mouth See admin instructions. Take 20 meq twice daily. May increase to 40 meq twice daily as needed for increased fluid retention) 180 tablet 3   rosuvastatin (CRESTOR) 40 MG tablet Take 1 tablet (40 mg total) by mouth daily. 90 tablet 3    Tiotropium Bromide Monohydrate (SPIRIVA RESPIMAT) 1.25 MCG/ACT AERS Inhale 1.25 mcg into the lungs daily.     tiZANidine (ZANAFLEX) 2 MG tablet Take 1 tablet (2 mg total) by mouth every 6 (six) hours as needed for muscle spasms. 40 tablet 1   traMADol (ULTRAM) 50 MG tablet TAKE 1 TABLET BY MOUTH EVERY 6 HOURS AS NEEDED. 60 tablet 1   traZODone (DESYREL) 100 MG tablet TAKE 1 TABLET BY MOUTH EVERYDAY AT BEDTIME 90 tablet 1   No current facility-administered medications for this visit.   Facility-Administered Medications Ordered in Other Visits  Medication Dose Route Frequency Provider Last Rate Last Admin   Spy Agent Green / Firefly Optime  1.25 mg Intravenous Once Greer Pickerel, MD         PHYSICAL EXAMINATION: ECOG PERFORMANCE STATUS: 0 - Asymptomatic  Vitals:   02/10/23 1042  BP: 122/76  Pulse: (!) 43  Resp: 16  Temp: 97.7 F (36.5 C)  SpO2: 100%    Filed Weights   02/10/23 1042  Weight: 216 lb 1.6 oz (98 kg)    PE deferred in lieu of counseling  LABORATORY DATA:  I have reviewed the data as listed Lab Results  Component Value Date   WBC 3.1 (L) 02/04/2023   HGB 11.7 (L) 02/04/2023   HCT 38.3 02/04/2023   MCV 86.5 02/04/2023   PLT 165 02/04/2023     Chemistry      Component Value Date/Time   NA 140 02/04/2023 1000   NA 142 11/07/2019 1605   K 3.8 02/04/2023 1000   CL 102 02/04/2023 1000   CO2 29 02/04/2023 1000   BUN 13 02/04/2023 1000   BUN 15 11/07/2019 1605   CREATININE 1.02 (H) 02/04/2023 1000   CREATININE 0.79 12/20/2022 1204      Component Value Date/Time   CALCIUM 8.8 (L) 02/04/2023 1000   ALKPHOS 62 12/20/2022 1204   AST 15 12/20/2022 1204   ALT 7 12/20/2022 1204   BILITOT 0.4 12/20/2022 1204  I have reviewed her labs for the past couple years.  She does have progressive leukopenia and neutropenia.  Anemia has overall remained stable, normocytic normochromic Ferritin of 132.3 TIBC of 311 Iron Sat 17 Folate serum 10.9  RADIOGRAPHIC  STUDIES: I have personally reviewed the radiological images as listed and agreed with the findings in the report. IR BONE MARROW BIOPSY & ASPIRATION  Result Date: 01/28/2023 CLINICAL DATA:  Leukopenia and anemia. Need for bone marrow biopsy for further hematologic workup. EXAM: FLUOROSCOPIC GUIDED BONE MARROW ASPIRATION AND BIOPSY ANESTHESIA/SEDATION: Moderate (conscious) sedation was employed during this procedure. A total of Versed 1.0 mg and Fentanyl 50 mcg was administered intravenously. Moderate Sedation Time: 10 minutes. The patient's level of consciousness and vital signs were monitored continuously by radiology nursing throughout the procedure under my direct supervision. PROCEDURE: The procedure risks, benefits, and alternatives were explained to the patient. Questions regarding the procedure were encouraged and answered. The patient understands and consents to the procedure. A time out was performed prior to initiating the procedure. The right gluteal region was prepped with chlorhexidine. Sterile gown and sterile gloves were used for the procedure. Local anesthesia was provided with 1% Lidocaine. Under fluoroscopic guidance, an 11 gauge On Control bone cutting needle was advanced from a posterior approach into the right iliac bone. Initial non heparinized and heparinized aspirate samples were obtained of bone marrow. Core biopsy was performed via the On Control drill needle. COMPLICATIONS: None FINDINGS: Inspection of initial aspirate did reveal visible particles. Intact core biopsy sample was obtained. IMPRESSION: Image guided bone marrow biopsy of right posterior iliac bone with both aspirate and core samples obtained. Electronically Signed   By: Aletta Edouard M.D.   On: 01/28/2023 11:57    All questions were answered. The patient knows to call the clinic with any problems, questions or concerns. I spent 30 minutes in the care of this patient including H and P, review of records, counseling and  coordination of care.     Benay Pike, MD 02/10/2023 10:43 AM

## 2023-02-11 ENCOUNTER — Other Ambulatory Visit: Payer: Self-pay | Admitting: Internal Medicine

## 2023-02-17 DIAGNOSIS — F339 Major depressive disorder, recurrent, unspecified: Secondary | ICD-10-CM | POA: Diagnosis not present

## 2023-02-17 DIAGNOSIS — I509 Heart failure, unspecified: Secondary | ICD-10-CM | POA: Diagnosis not present

## 2023-02-17 DIAGNOSIS — G8929 Other chronic pain: Secondary | ICD-10-CM | POA: Diagnosis not present

## 2023-02-17 DIAGNOSIS — J4540 Moderate persistent asthma, uncomplicated: Secondary | ICD-10-CM | POA: Diagnosis not present

## 2023-02-17 DIAGNOSIS — E7800 Pure hypercholesterolemia, unspecified: Secondary | ICD-10-CM | POA: Diagnosis not present

## 2023-02-17 DIAGNOSIS — I10 Essential (primary) hypertension: Secondary | ICD-10-CM | POA: Diagnosis not present

## 2023-02-18 ENCOUNTER — Ambulatory Visit: Payer: Self-pay | Admitting: General Surgery

## 2023-02-18 MED ORDER — SPY AGENT GREEN - (INDOCYANINE FOR INJECTION)
1.2500 mg | Freq: Once | INTRAMUSCULAR | Status: AC
  Administered 2023-02-19: 1.25 mg via INTRAVENOUS

## 2023-02-18 NOTE — Progress Notes (Addendum)
Called and updated patient on new date and arrival time for surgery.  Cardiac clearance received from Coletta Memos, NP, [place]   PCP - [facility] Cardiologist - Dr [name]   PPM/ICD - Denies   Chest x-ray - 10/30/2022 EKG - 02/04/2023 Stress Test - 12/28/2014 ECHO - 03/26/2018 Cardiac Cath - 01/2004   Sleep Study - Denies CPAP - Denies   Non-diabetic Last dose of GLP1 agonist-  Non-diabetic   Blood Thinner Instructions:Denies Aspirin Instructions:Last dose was 02/01/2023   ERAS Protcol - NPO PRE-SURGERY Ensure or G2- No   COVID TEST- Denies     Anesthesia review: No   Patient denies shortness of breath, fever, cough and chest pain at PAT appointment     All instructions explained to the patient, with a verbal understanding of the material. Patient agrees to go over the instructions while at home for a better understanding. Patient also instructed to self quarantine after being tested for COVID-19. The opportunity to ask questions was provided.

## 2023-02-19 ENCOUNTER — Ambulatory Visit (HOSPITAL_COMMUNITY): Payer: Medicare PPO

## 2023-02-19 ENCOUNTER — Other Ambulatory Visit: Payer: Self-pay

## 2023-02-19 ENCOUNTER — Other Ambulatory Visit (HOSPITAL_COMMUNITY): Payer: Self-pay

## 2023-02-19 ENCOUNTER — Encounter (HOSPITAL_COMMUNITY)
Admission: RE | Disposition: A | Discharge status: Home / Self Care | Payer: Self-pay | Source: Home / Self Care | Attending: General Surgery

## 2023-02-19 ENCOUNTER — Ambulatory Visit (HOSPITAL_COMMUNITY)
Admission: RE | Admit: 2023-02-19 | Discharge: 2023-02-19 | Disposition: A | Discharge status: Home / Self Care | Payer: Medicare PPO | Attending: General Surgery | Admitting: General Surgery

## 2023-02-19 ENCOUNTER — Encounter (HOSPITAL_COMMUNITY): Payer: Self-pay | Admitting: General Surgery

## 2023-02-19 ENCOUNTER — Ambulatory Visit (HOSPITAL_BASED_OUTPATIENT_CLINIC_OR_DEPARTMENT_OTHER): Payer: Medicare PPO

## 2023-02-19 DIAGNOSIS — Z90710 Acquired absence of both cervix and uterus: Secondary | ICD-10-CM | POA: Insufficient documentation

## 2023-02-19 DIAGNOSIS — I110 Hypertensive heart disease with heart failure: Secondary | ICD-10-CM

## 2023-02-19 DIAGNOSIS — I739 Peripheral vascular disease, unspecified: Secondary | ICD-10-CM | POA: Diagnosis not present

## 2023-02-19 DIAGNOSIS — K8020 Calculus of gallbladder without cholecystitis without obstruction: Secondary | ICD-10-CM | POA: Diagnosis not present

## 2023-02-19 DIAGNOSIS — I509 Heart failure, unspecified: Secondary | ICD-10-CM

## 2023-02-19 DIAGNOSIS — K8010 Calculus of gallbladder with chronic cholecystitis without obstruction: Secondary | ICD-10-CM | POA: Insufficient documentation

## 2023-02-19 DIAGNOSIS — D649 Anemia, unspecified: Secondary | ICD-10-CM | POA: Diagnosis not present

## 2023-02-19 DIAGNOSIS — I2510 Atherosclerotic heart disease of native coronary artery without angina pectoris: Secondary | ICD-10-CM | POA: Diagnosis not present

## 2023-02-19 DIAGNOSIS — J4489 Other specified chronic obstructive pulmonary disease: Secondary | ICD-10-CM | POA: Diagnosis not present

## 2023-02-19 DIAGNOSIS — K449 Diaphragmatic hernia without obstruction or gangrene: Secondary | ICD-10-CM | POA: Diagnosis not present

## 2023-02-19 DIAGNOSIS — Z87891 Personal history of nicotine dependence: Secondary | ICD-10-CM

## 2023-02-19 DIAGNOSIS — M1990 Unspecified osteoarthritis, unspecified site: Secondary | ICD-10-CM | POA: Diagnosis not present

## 2023-02-19 DIAGNOSIS — K219 Gastro-esophageal reflux disease without esophagitis: Secondary | ICD-10-CM | POA: Insufficient documentation

## 2023-02-19 DIAGNOSIS — Z09 Encounter for follow-up examination after completed treatment for conditions other than malignant neoplasm: Secondary | ICD-10-CM | POA: Diagnosis not present

## 2023-02-19 HISTORY — PX: CHOLECYSTECTOMY: SHX55

## 2023-02-19 SURGERY — LAPAROSCOPIC CHOLECYSTECTOMY WITH INTRAOPERATIVE CHOLANGIOGRAM
Anesthesia: General

## 2023-02-19 MED ORDER — ACETAMINOPHEN 500 MG PO TABS: 1000.0000 mg | ORAL_TABLET | Freq: Once | ORAL | Status: DC | PRN

## 2023-02-19 MED ORDER — OXYCODONE HCL 5 MG PO TABS: 5.0000 mg | ORAL_TABLET | Freq: Once | ORAL | Status: DC | PRN

## 2023-02-19 MED ORDER — 0.9 % SODIUM CHLORIDE (POUR BTL) OPTIME
TOPICAL | Status: DC | PRN
  Administered 2023-02-19: 1000 mL

## 2023-02-19 MED ORDER — PROPOFOL 10 MG/ML IV BOLUS
INTRAVENOUS | Status: DC | PRN
  Administered 2023-02-19: 110 mg via INTRAVENOUS

## 2023-02-19 MED ORDER — KETOROLAC TROMETHAMINE 30 MG/ML IJ SOLN
INTRAMUSCULAR | Status: DC | PRN
  Administered 2023-02-19: 15 mg via INTRAVENOUS

## 2023-02-19 MED ORDER — TRAMADOL HCL 50 MG PO TABS
50.0000 mg | ORAL_TABLET | Freq: Four times a day (QID) | ORAL | 0 refills | Status: AC | PRN
  Filled 2023-02-19: qty 15, 4d supply, fill #0

## 2023-02-19 MED ORDER — LACTATED RINGERS IV SOLN: INTRAVENOUS | Status: DC | PRN

## 2023-02-19 MED ORDER — SUGAMMADEX SODIUM 200 MG/2ML IV SOLN
INTRAVENOUS | Status: DC | PRN
  Administered 2023-02-19: 200 mg via INTRAVENOUS

## 2023-02-19 MED ORDER — GLYCOPYRROLATE 0.2 MG/ML IJ SOLN
INTRAMUSCULAR | Status: DC | PRN
  Administered 2023-02-19: .1 mg via INTRAVENOUS

## 2023-02-19 MED ORDER — FENTANYL CITRATE (PF) 250 MCG/5ML IJ SOLN
INTRAMUSCULAR | Status: DC | PRN
  Administered 2023-02-19 (×3): 50 ug via INTRAVENOUS

## 2023-02-19 MED ORDER — PROPOFOL 10 MG/ML IV BOLUS
INTRAVENOUS | Status: AC
  Filled 2023-02-19: qty 20

## 2023-02-19 MED ORDER — CHLORHEXIDINE GLUCONATE CLOTH 2 % EX PADS: 6.0000 | MEDICATED_PAD | Freq: Once | CUTANEOUS | Status: DC

## 2023-02-19 MED ORDER — FENTANYL CITRATE (PF) 100 MCG/2ML IJ SOLN: 25.0000 ug | INTRAMUSCULAR | Status: DC | PRN

## 2023-02-19 MED ORDER — BUPIVACAINE HCL 0.25 % IJ SOLN
INTRAMUSCULAR | Status: DC | PRN
  Administered 2023-02-19: 25 mL

## 2023-02-19 MED ORDER — DEXAMETHASONE SODIUM PHOSPHATE 10 MG/ML IJ SOLN
INTRAMUSCULAR | Status: DC | PRN
  Administered 2023-02-19: 10 mg via INTRAVENOUS

## 2023-02-19 MED ORDER — CHLORHEXIDINE GLUCONATE 0.12 % MT SOLN
15.0000 mL | Freq: Once | OROMUCOSAL | Status: AC
  Administered 2023-02-19: 15 mL via OROMUCOSAL
  Filled 2023-02-19: qty 15

## 2023-02-19 MED ORDER — LIDOCAINE 2% (20 MG/ML) 5 ML SYRINGE
INTRAMUSCULAR | Status: DC | PRN
  Administered 2023-02-19: 80 mg via INTRAVENOUS

## 2023-02-19 MED ORDER — ACETAMINOPHEN 500 MG PO TABS
1000.0000 mg | ORAL_TABLET | ORAL | Status: AC
  Administered 2023-02-19: 1000 mg via ORAL
  Filled 2023-02-19: qty 2

## 2023-02-19 MED ORDER — ROCURONIUM BROMIDE 10 MG/ML (PF) SYRINGE
PREFILLED_SYRINGE | INTRAVENOUS | Status: DC | PRN
  Administered 2023-02-19: 50 mg via INTRAVENOUS
  Administered 2023-02-19: 30 mg via INTRAVENOUS

## 2023-02-19 MED ORDER — ACETAMINOPHEN 160 MG/5ML PO SOLN: 1000.0000 mg | Freq: Once | ORAL | Status: DC | PRN

## 2023-02-19 MED ORDER — LACTATED RINGERS IV SOLN: INTRAVENOUS | Status: DC

## 2023-02-19 MED ORDER — INDOCYANINE GREEN 25 MG IV SOLR
1.2500 mg | Freq: Once | INTRAVENOUS | Status: DC
  Filled 2023-02-19: qty 10

## 2023-02-19 MED ORDER — ORAL CARE MOUTH RINSE: 15.0000 mL | Freq: Once | OROMUCOSAL | Status: AC

## 2023-02-19 MED ORDER — SODIUM CHLORIDE 0.9 % IV SOLN
2.0000 g | INTRAVENOUS | Status: AC
  Administered 2023-02-19: 2 g via INTRAVENOUS
  Filled 2023-02-19: qty 2

## 2023-02-19 MED ORDER — SODIUM CHLORIDE 0.9 % IR SOLN
Status: DC | PRN
  Administered 2023-02-19: 1000 mL

## 2023-02-19 MED ORDER — ACETAMINOPHEN 10 MG/ML IV SOLN: 1000.0000 mg | Freq: Once | INTRAVENOUS | Status: DC | PRN

## 2023-02-19 MED ORDER — ONDANSETRON HCL 4 MG/2ML IJ SOLN
INTRAMUSCULAR | Status: DC | PRN
  Administered 2023-02-19: 4 mg via INTRAVENOUS

## 2023-02-19 MED ORDER — OXYCODONE HCL 5 MG/5ML PO SOLN: 5.0000 mg | Freq: Once | ORAL | Status: DC | PRN

## 2023-02-19 MED ORDER — FENTANYL CITRATE (PF) 250 MCG/5ML IJ SOLN
INTRAMUSCULAR | Status: AC
  Filled 2023-02-19: qty 5

## 2023-02-19 MED ORDER — SODIUM CHLORIDE 0.9 % IV SOLN
INTRAVENOUS | Status: DC | PRN
  Administered 2023-02-19: 25 mL

## 2023-02-19 SURGICAL SUPPLY — 57 items
ADH SKN CLS APL DERMABOND .7 (GAUZE/BANDAGES/DRESSINGS) ×1
APL PRP STRL LF DISP 70% ISPRP (MISCELLANEOUS) ×1
APPLIER CLIP 5 13 M/L LIGAMAX5 (MISCELLANEOUS) ×1
APR CLP MED LRG 5 ANG JAW (MISCELLANEOUS) ×1
BAG COUNTER SPONGE SURGICOUNT (BAG) ×1 IMPLANT
BAG SPEC RTRVL 10 TROC 200 (ENDOMECHANICALS) ×1
BAG SPNG CNTER NS LX DISP (BAG) ×1
BLADE CLIPPER SURG (BLADE) IMPLANT
CANISTER SUCT 3000ML PPV (MISCELLANEOUS) ×1 IMPLANT
CHLORAPREP W/TINT 26 (MISCELLANEOUS) ×1 IMPLANT
CLIP APPLIE 5 13 M/L LIGAMAX5 (MISCELLANEOUS) ×1 IMPLANT
COVER MAYO STAND STRL (DRAPES) ×1 IMPLANT
COVER SURGICAL LIGHT HANDLE (MISCELLANEOUS) ×1 IMPLANT
DERMABOND ADVANCED .7 DNX12 (GAUZE/BANDAGES/DRESSINGS) IMPLANT
DRAPE C-ARM 42X120 X-RAY (DRAPES) ×1 IMPLANT
DRSG TEGADERM 2-3/8X2-3/4 SM (GAUZE/BANDAGES/DRESSINGS) ×3 IMPLANT
DRSG TEGADERM 4X4.75 (GAUZE/BANDAGES/DRESSINGS) ×1 IMPLANT
ELECT REM PT RETURN 9FT ADLT (ELECTROSURGICAL) ×1
ELECTRODE REM PT RTRN 9FT ADLT (ELECTROSURGICAL) ×1 IMPLANT
ENDOLOOP SUT PDS II  0 18 (SUTURE) ×1
ENDOLOOP SUT PDS II 0 18 (SUTURE) IMPLANT
GAUZE SPONGE 2X2 8PLY STRL LF (GAUZE/BANDAGES/DRESSINGS) ×1 IMPLANT
GLOVE BIOGEL M STRL SZ7.5 (GLOVE) ×1 IMPLANT
GLOVE INDICATOR 8.0 STRL GRN (GLOVE) ×2 IMPLANT
GOWN STRL REUS W/ TWL LRG LVL3 (GOWN DISPOSABLE) ×3 IMPLANT
GOWN STRL REUS W/ TWL XL LVL3 (GOWN DISPOSABLE) ×1 IMPLANT
GOWN STRL REUS W/TWL LRG LVL3 (GOWN DISPOSABLE) ×3
GOWN STRL REUS W/TWL XL LVL3 (GOWN DISPOSABLE) ×1
GRASPER SUT TROCAR 14GX15 (MISCELLANEOUS) ×1 IMPLANT
IRRIG SUCT STRYKERFLOW 2 WTIP (MISCELLANEOUS) ×1
IRRIGATION SUCT STRKRFLW 2 WTP (MISCELLANEOUS) ×1 IMPLANT
KIT BASIN OR (CUSTOM PROCEDURE TRAY) ×1 IMPLANT
KIT TURNOVER KIT B (KITS) ×1 IMPLANT
L-HOOK LAP DISP 36CM (ELECTROSURGICAL) ×1
LHOOK LAP DISP 36CM (ELECTROSURGICAL) IMPLANT
NS IRRIG 1000ML POUR BTL (IV SOLUTION) ×1 IMPLANT
PAD ARMBOARD 7.5X6 YLW CONV (MISCELLANEOUS) ×1 IMPLANT
POUCH RETRIEVAL ECOSAC 10 (ENDOMECHANICALS) ×1 IMPLANT
POUCH RETRIEVAL ECOSAC 10MM (ENDOMECHANICALS) ×1
SCISSORS LAP 5X35 DISP (ENDOMECHANICALS) ×1 IMPLANT
SET CHOLANGIOGRAPH 5 50 .035 (SET/KITS/TRAYS/PACK) ×1 IMPLANT
SET TUBE SMOKE EVAC HIGH FLOW (TUBING) ×1 IMPLANT
SLEEVE Z-THREAD 5X100MM (TROCAR) ×2 IMPLANT
SPECIMEN JAR SMALL (MISCELLANEOUS) ×1 IMPLANT
STRIP CLOSURE SKIN 1/2X4 (GAUZE/BANDAGES/DRESSINGS) ×1 IMPLANT
SUT MNCRL AB 4-0 PS2 18 (SUTURE) ×1 IMPLANT
SUT VIC AB 0 UR5 27 (SUTURE) ×1 IMPLANT
SUT VICRYL 0 TIES 12 18 (SUTURE) IMPLANT
SUT VICRYL 0 UR6 27IN ABS (SUTURE) IMPLANT
TOWEL GREEN STERILE (TOWEL DISPOSABLE) ×1 IMPLANT
TOWEL GREEN STERILE FF (TOWEL DISPOSABLE) ×1 IMPLANT
TRAY LAPAROSCOPIC MC (CUSTOM PROCEDURE TRAY) ×1 IMPLANT
TROCAR 11X100 Z THREAD (TROCAR) IMPLANT
TROCAR BALLN 12MMX100 BLUNT (TROCAR) ×1 IMPLANT
TROCAR Z-THREAD OPTICAL 5X100M (TROCAR) ×1 IMPLANT
WARMER LAPAROSCOPE (MISCELLANEOUS) ×1 IMPLANT
WATER STERILE IRR 1000ML POUR (IV SOLUTION) ×1 IMPLANT

## 2023-02-19 NOTE — H&P (Signed)
CC: here for surgery  Requesting provider: n/a  HPI: [name] is an 82 y.o. female who is here for lap chole with icg. Denies any changes since seen  Old hpi: [name] is a 82 y.o. female who is seen today as an office consultation at the request of Dr. Myrtice Lauth for evaluation of New Consultation (Gallstones - c/o abd pain ) .  She recently saw hematology on February 5 for mild and progressive leukopenia as well as normocytic normochromic anemia.   She saw GI on January 16 with complaints of a 2 to 30-month history of diarrhea. She told them she was having a bowel movement anywhere from 7 to 8-9 times a day and had lower abdominal discomfort mostly on the left side. No melena or hematochezia. She denied any antibiotic use or travel or new medications. She told them she had had labs and stool studies by her [facility] primary care team.  [facility] has a past medical history of COPD, hypertension, coronary artery disease, degenerative joint disease.  She states that she has been having loose stool for the past several months. It can occur during and after eating but it can also occur at other random times. The loose stool can be watery, it can be brown-tinged or black tinge. She does take supplemental iron. She initially had pain in her lower abdomen but now she states her pain is in her upper abdomen and points to across her upper abdomen. The pain is on both the right side, upper midline and left side. It does not radiate to the back or shoulder. Sometimes she may have nausea but that is infrequent. When she does have the discomfort it generally lasts for most of the day. It can occur after eating but not necessarily. She denies any bright red blood. She does take Tylenol and Aleve daily. She has had a prior abdominal hysterectomy. She denies any unplanned weight change   Past Medical History:  Diagnosis Date   Allergic rhinitis    Arnold-Chiari malformation (HCC)    Asthma    CAD (coronary artery  disease)    COPD (chronic obstructive pulmonary disease) (HCC)    Coughing    coughing since last 01-22-2019 , started on cefdinir bid x10 days, has 3 pills left today , reports she feels mcuh better , still coughing copiuus amonts of thick white sputum , denies fever nor chills, nor body aches .    DJD (degenerative joint disease)    GERD (gastroesophageal reflux disease)    Hiatal hernia    History of absence seizures    last confirmed seizure age 75     Hx of colonic polyps    Hypercholesteremia    Hypertension    Obesity    Positive PPD    Reflex sympathetic dystrophy    Sleep apnea    Varicose veins    Venous insufficiency     Past Surgical History:  Procedure Laterality Date   ABDOMINAL HYSTERECTOMY     cspine surgery  1993   for arnold-chiari malformation   IR BONE MARROW BIOPSY & ASPIRATION  01/28/2023   JOINT REPLACEMENT  06/05/11   Left total knee replacement   right knee arthroscopy  12/2007   Dr. Tonita Cong   right total knee replacement  05/2008   Dr. Tonita Cong   TOTAL KNEE REVISION Left 02/03/2019   Procedure: LEFT TOTAL KNEE REVISION;  Surgeon: Rod Can, MD;  Location: [facility];  Service: Orthopedics;  Laterality: Left;  Family History  Problem Relation Age of Onset   Heart disease Mother    Stroke Mother    Stroke Father    Clotting disorder Father    Colon cancer Neg Hx    Stomach cancer Neg Hx     Social:  reports that she quit smoking about 11 years ago. Her smoking use included cigarettes. She has a 3.60 pack-year smoking history. She has never used smokeless tobacco. She reports that she does not drink alcohol and does not use drugs.  Allergies: No Known Allergies  Medications: I have reviewed the patient's current medications.   ROS - all of the below systems have been reviewed with the patient and positives are indicated with bold text General: chills, fever or night sweats Eyes: blurry vision or double vision ENT: epistaxis or sore  throat Allergy/Immunology: itchy/watery eyes or nasal congestion Hematologic/Lymphatic: bleeding problems, blood clots or swollen lymph nodes Endocrine: temperature intolerance or unexpected weight changes Breast: new or changing breast lumps or nipple discharge Resp: cough, shortness of breath, or wheezing CV: chest pain or dyspnea on exertion GI: as per HPI GU: dysuria, trouble voiding, or hematuria MSK: joint pain or joint stiffness Neuro: TIA or stroke symptoms Derm: pruritus and skin lesion changes Psych: anxiety and depression  PE Blood pressure 109/69, pulse (!) 50, temperature 98.8 F (37.1 C), temperature source Oral, resp. rate 18, height 5\' 4"  (1.626 m), weight 98 kg, SpO2 98 %. Constitutional: NAD; conversant; no deformities Eyes: Moist conjunctiva; no lid lag; anicteric; PERRL Neck: Trachea midline; no thyromegaly Lungs: Normal respiratory effort; no tactile fremitus CV: RRR; no palpable thrills; no pitting edema GI: Abd soft, nt; no palpable hepatosplenomegaly MSK: ; no clubbing/cyanosis Psychiatric: Appropriate affect; alert and oriented x3 Lymphatic: No palpable cervical or axillary lymphadenopathy Skin:no rash/lesions  No results found for this or any previous visit (from the past 62 hour(s)).  No results found.  Imaging: reviewed  A/P: [name] is an 82 y.o. female with  Diarrhea in adult patient  Symptomatic cholelithiasis   To OR for lap chole with icg dye All questions asked and answered  Leighton Ruff. Redmond Pulling, MD, FACS General, Bariatric, & Minimally Invasive Surgery [place]

## 2023-02-19 NOTE — Op Note (Signed)
VERDELLA STOEN [id number] June 13, 1941 02/19/2023  Laparoscopic Cholecystectomy with IOC & near infrared fluorescent cholangiography procedure Note  Indications: This patient presents with symptomatic gallbladder disease and will undergo laparoscopic cholecystectomy.  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 The risks, benefits, complications, treatment options, and expected outcomes were discussed with the patient. The possibilities of reaction to medication, pulmonary aspiration, perforation of viscus, bleeding, recurrent infection, finding a normal gallbladder, the need for additional procedures, failure to diagnose a condition, the possible need to convert to an open procedure, and creating a complication requiring transfusion or operation were discussed with the patient. The likelihood of improving the patient's symptoms with return to their baseline status is good.  The patient and/or family concurred with the proposed plan, giving informed consent. The site of surgery properly noted. The patient was taken to Operating Room, identified as [name] and the procedure verified as Laparoscopic Cholecystectomy with Intraoperative Cholangiogram. A Time Out was held and the above information confirmed. Antibiotic prophylaxis was administered.  The patient had been administered ICG dye preoperatively.  Prior to the induction of general anesthesia, antibiotic prophylaxis was administered. General endotracheal anesthesia was then administered and tolerated well. After the induction, the  abdomen was prepped with Chloraprep and draped in the sterile fashion. The patient was positioned in the supine position.  Local anesthetic agent was injected into the skin near the umbilicus and an incision made. We dissected down to the abdominal fascia with blunt dissection.  The fascia was incised vertically and we entered the peritoneal cavity bluntly.  I felt what appeared to be adhesions when I did my finger sweep.  So therefore I made a small incision in the left upper quadrant just the left of the midline underneath the left subcostal margin and using a 0 degree 5 mm laparoscope through a 5 mm trocar Optiview.  Pneumoperitoneum was established.  Laparoscope was advanced.  I surveilled the supraumbilical area.  There is some omental adhesions to the area but no bowel.  I then placed a A pursestring suture of 0-Vicryl was placed around the supraumbilical fascial opening.  The Hasson cannula was inserted and secured with the stay suture.  Two 5-mm ports were placed in the right upper quadrant. All skin incisions were infiltrated with a local anesthetic agent before making the incision and placing the trocars.  I inspected the Optiview site in the upper abdomen.  There is no evidence of injury to the stomach.  Her stomach was quite distended and I had anesthesia placed an OG to decompress her stomach.  We positioned the patient in reverse Trendelenburg, tilted slightly to the patient's left.  The gallbladder was identified, the fundus grasped and retracted cephalad. Adhesions were lysed bluntly and with the electrocautery where indicated, taking care not to injure any adjacent organs or viscus. The infundibulum was grasped and retracted laterally, exposing the peritoneum overlying the triangle of Calot. This was then divided and exposed in a blunt fashion.  The patient had essentially a very large gallstone taking up her entire gallbladder.  It extended all the way down to the infundibulum.  It was a little  bit difficult in retracting the infundibulum with the procedure grasper due to the stone taking up the entire infundibulum.  In order to facilitate retraction I ended up upsizing the mid right abdominal wall 5 mm trocar to a 10 mm trocar so I could place a laparoscopic grasper with teeth to easily grasp and retract the infundibulum.  A critical view of the cystic duct and cystic artery was obtained.  The cystic duct was clearly identified and bluntly dissected circumferentially.  The cystic duct appeared dilated.  Using the Stryker system we activated the special optics to visualize the ICG dye.  Near infrared fluorescent activity was seen within the cystic duct, common hepatic duct and common bile duct and liver.  The common hepatic duct and common bile duct appeared dilated as well.  I can see the confluence of the cystic duct going into the common bile duct.  This served as secondary confirmation of my anatomy.  The cystic duct was ligated with a clip distally.   An incision was made in the cystic duct and the [facility] cholangiogram catheter introduced. The catheter was secured using a clip. A cholangiogram was then obtained which showed good visualization of the distal and proximal biliary tree with no sign of filling defects or obstruction.  Contrast flowed easily into the duodenum. The catheter was then removed.   The cystic duct was then ligated with clips and divided.  Given how dilated the cystic duct was I decided to place a PDS Endoloop around the base of the cystic duct stump.  The cystic artery which had been identified & dissected free was ligated with clips and divided as well.   The gallbladder was dissected from the liver bed in retrograde fashion with the electrocautery. The gallbladder was removed and placed in an Ecco sac. The liver bed was irrigated and inspected. Hemostasis was achieved with the electrocautery. Copious irrigation was utilized and was repeatedly aspirated until clear.  The  gallbladder and Ecco sac were then removed through the umbilical port site.  The pursestring suture was used to close the umbilical fascia.  2 additional interrupted 0 Vicryl's were placed at the umbilical fascia using the PMI suture passer with laparoscopic guidance since her fascia was attenuated.  We again inspected the right upper quadrant for hemostasis.  The umbilical closure was inspected and there was no air leak and nothing trapped within the closure.  I also placed a 0 Vicryl at the right mid abdominal 10 mm trocar site using the PMI suture passer with laparoscopic guidance.  Local was infiltrated in this location as well.  Pneumoperitoneum was released as we removed the trocars.  4-0 Monocryl was used to close the skin.   Dermabond was applied. The patient was then extubated and brought to the recovery room in stable condition. Instrument, sponge, and needle counts were correct at closure and at the conclusion of the case.   Findings: Cholecystitis with Cholelithiasis Positive critical view Infrared fluorescent activity visualized within the cystic duct, common hepatic duct, common bile duct  Estimated Blood Loss: Minimal         Drains: none         Specimens: Gallbladder           Complications: None; patient tolerated the procedure well.         Disposition: PACU - hemodynamically stable.         Condition: stable  Leighton Ruff. Redmond Pulling, MD, FACS General, Bariatric, & Minimally Invasive Surgery [facility]  Surgery,  [place]

## 2023-02-19 NOTE — Anesthesia Procedure Notes (Signed)
Procedure Name: Intubation Date/Time: 02/19/2023 7:45 AM  Performed by: Griffin Dakin, CRNAPre-anesthesia Checklist: Patient identified, Emergency Drugs available, Suction available and Patient being monitored Patient Re-evaluated:Patient Re-evaluated prior to induction Oxygen Delivery Method: Circle system utilized Preoxygenation: Pre-oxygenation with 100% oxygen Induction Type: IV induction Ventilation: Mask ventilation without difficulty and Oral airway inserted - appropriate to patient size Laryngoscope Size: Mac and 3 Grade View: Grade I Tube type: Oral Tube size: 7.0 mm Number of attempts: 1 Airway Equipment and Method: Stylet and Oral airway Placement Confirmation: ETT inserted through vocal cords under direct vision, positive ETCO2 and breath sounds checked- equal and bilateral Secured at: 21 cm Tube secured with: Tape Dental Injury: Teeth and Oropharynx as per pre-operative assessment  Comments: Intubation done by paramedic student.

## 2023-02-19 NOTE — Anesthesia Preprocedure Evaluation (Signed)
Anesthesia Evaluation  Patient identified by MRN, date of birth, ID band Patient awake    Reviewed: Allergy & Precautions, NPO status , Patient's Chart, lab work & pertinent test results  History of Anesthesia Complications Negative for: history of anesthetic complications  Airway Mallampati: III  TM Distance: >3 FB Neck ROM: Full    Dental  (+) Edentulous Upper, Edentulous Lower, Upper Dentures, Lower Dentures, Dental Advisory Given   Pulmonary neg shortness of breath, asthma , neg sleep apnea, neg recent URI, former smoker   breath sounds clear to auscultation       Cardiovascular hypertension, + CAD, + Peripheral Vascular Disease and +CHF   Rhythm:Regular  Left ventricle: The cavity size was normal. Wall thickness was    increased in a pattern of mild LVH. Systolic function was normal.    The estimated ejection fraction was in the range of 55% to 60%.    Wall motion was normal; there were no regional wall motion    abnormalities. Doppler parameters are consistent with abnormal    left ventricular relaxation (grade 1 diastolic dysfunction).  - Aortic valve: Mildly calcified annulus. Trileaflet.  - Mitral valve: Mildly thickened leaflets .  - Right ventricle: The cavity size was mildly dilated. Wall    thickness was normal. Systolic function was low normal.  - Right atrium: The atrium was mildly dilated.  - Tricuspid valve: There was mild regurgitation.  - Pulmonary arteries: PA peak pressure: 36 mm Hg (S).     Cardiac catheterization 2/05 showed mild irregularities in left main, 40% proximal LAD, 30% distal LAD lesion, first diagonal 30-40% proximal lesion, ostial LAD 60%, and nonobstructive circumflex and RCA.  She was noted to have normal LV function.  She underwent stress testing 1/16 which showed an EF of 58% and normal perfusion.    Neuro/Psych  PSYCHIATRIC DISORDERS Anxiety        GI/Hepatic Neg liver ROS, hiatal  hernia,GERD  Medicated,,  Endo/Other    Renal/GU Renal InsufficiencyRenal diseaseLab Results      Component                Value               Date                      CREATININE               1.02 (H)            02/04/2023                Musculoskeletal  (+) Arthritis ,    Abdominal   Peds  Hematology  (+) Blood dyscrasia, anemia Lab Results      Component                Value               Date                      WBC                      3.1 (L)             02/04/2023                HGB  11.7 (L)            02/04/2023                HCT                      38.3                02/04/2023                MCV                      86.5                02/04/2023                PLT                      165                 02/04/2023              Anesthesia Other Findings   Reproductive/Obstetrics                             Anesthesia Physical Anesthesia Plan  ASA: 3  Anesthesia Plan: General   Post-op Pain Management: Tylenol PO (pre-op)*   Induction: Intravenous  PONV Risk Score and Plan: 3 and Ondansetron and Dexamethasone  Airway Management Planned: Oral ETT  Additional Equipment: None  Intra-op Plan:   Post-operative Plan: Extubation in OR  Informed Consent: I have reviewed the patients History and Physical, chart, labs and discussed the procedure including the risks, benefits and alternatives for the proposed anesthesia with the patient or authorized representative who has indicated his/her understanding and acceptance.     Dental advisory given  Plan Discussed with: CRNA  Anesthesia Plan Comments:        Anesthesia Quick Evaluation

## 2023-02-19 NOTE — Transfer of Care (Signed)
Immediate Anesthesia Transfer of Care Note  Patient: [name]  Procedure(s) Performed: LAPAROSCOPIC CHOLECYSTECTOMY WITH INTRAOPERATIVE CHOLANGIOGRAM AND ICG DYE  Patient Location: PACU  Anesthesia Type:General  Level of Consciousness: awake, alert , and oriented  Airway & Oxygen Therapy: Patient Spontanous Breathing  Post-op Assessment: Report given to RN and Post -op Vital signs reviewed and stable  Post vital signs: Reviewed and stable  Last Vitals:  Vitals Value Taken Time  BP 139/85 02/19/23 0930  Temp    Pulse 66 02/19/23 0932  Resp 17 02/19/23 0932  SpO2 93 % 02/19/23 0932  Vitals shown include unvalidated device data.  Last Pain:  Vitals:   02/19/23 0557  TempSrc: Oral  PainSc: 5          Complications: No notable events documented.

## 2023-02-19 NOTE — Discharge Instructions (Signed)
[facility] LAPAROSCOPIC SURGERY: POST OP INSTRUCTIONS Always review your discharge instruction sheet given to you by the facility where your surgery was performed. IF YOU HAVE DISABILITY OR FAMILY LEAVE FORMS, YOU MUST BRING THEM TO THE OFFICE FOR PROCESSING.   DO NOT GIVE THEM TO YOUR DOCTOR.  PAIN CONTROL  First take acetaminophen (Tylenol) AND/or ibuprofen (Advil) to control your pain after surgery.  Follow directions on package.  Taking acetaminophen (Tylenol) and/or ibuprofen (Advil) regularly after surgery will help to control your pain and lower the amount of prescription pain medication you may need.  You should not take more than 3,000 mg (3 grams) of acetaminophen (Tylenol) in 24 hours.  You should not take ibuprofen (Advil), aleve, motrin, naprosyn or other NSAIDS if you have a history of stomach ulcers or chronic kidney disease.  A prescription for pain medication may be given to you upon discharge.  Take your pain medication as prescribed, if you still have uncontrolled pain after taking acetaminophen (Tylenol) or ibuprofen (Advil). Use ice packs to help control pain. If you need a refill on your pain medication, please contact your pharmacy.  They will contact our office to request authorization. Prescriptions will not be filled after 5pm or on week-ends.  HOME MEDICATIONS Take your usually prescribed medications unless otherwise directed.  DIET You should follow a light diet the first few days after arrival home.  Be sure to include lots of fluids daily. Avoid fatty, fried foods.   CONSTIPATION It is common to experience some constipation after surgery and if you are taking pain medication.  Increasing fluid intake and taking a stool softener (such as Colace) will usually help or prevent this problem from occurring.  A mild laxative (Milk of Magnesia or Miralax) should be taken according to package instructions if there are no bowel movements after 48  hours.  WOUND/INCISION CARE Most patients will experience some swelling and bruising in the area of the incisions.  Ice packs will help.  Swelling and bruising can take several days to resolve.  Unless discharge instructions indicate otherwise, follow guidelines below  STERI-STRIPS - you may remove your outer bandages 48 hours after surgery, and you may shower at that time.  You have steri-strips (small skin tapes) in place directly over the incision.  These strips should be left on the skin for 7-10 days.   DERMABOND/SKIN GLUE - you may shower in 24 hours.  The glue will flake off over the next 2-3 weeks. Any sutures or staples will be removed at the office during your follow-up visit.  ACTIVITIES You may resume regular (light) daily activities beginning the next day--such as daily self-care, walking, climbing stairs--gradually increasing activities as tolerated.  You may have sexual intercourse when it is comfortable.  Refrain from any heavy lifting or straining until approved by your doctor. You may drive when you are no longer taking prescription pain medication, you can comfortably wear a seatbelt, and you can safely maneuver your car and apply brakes.  FOLLOW-UP You should see your doctor in the office for a follow-up appointment approximately 2-3 weeks after your surgery.  You should have been given your post-op/follow-up appointment when your surgery was scheduled.  If you did not receive a post-op/follow-up appointment, make sure that you call for this appointment within a day or two after you arrive home to insure a convenient appointment time.  OTHER INSTRUCTIONS Expect pain at the belly button incision and middle right abdominal incision - have  stitches deep in muscle in those locations.  Pain will improve with time  WHEN TO CALL YOUR DOCTOR: Fever over 101.0 Inability to urinate Continued bleeding from incision. Increased pain, redness, or drainage from the incision. Increasing  abdominal pain  The clinic staff is available to answer your questions during regular business hours.  Please don't hesitate to call and ask to speak to one of the nurses for clinical concerns.  If you have a medical emergency, go to the nearest emergency room or call 911.  A surgeon from [facility] is always on call at the hospital. [address], [place], [place], [place]  [address] ? [address], [place], [place]   [address] [phone] ? [phone] ? FAX [phone] Web site: [email or web address]

## 2023-02-20 ENCOUNTER — Encounter (HOSPITAL_COMMUNITY): Payer: Self-pay | Admitting: General Surgery

## 2023-02-20 LAB — SURGICAL PATHOLOGY

## 2023-02-23 ENCOUNTER — Encounter (HOSPITAL_COMMUNITY): Payer: Self-pay | Admitting: General Surgery

## 2023-02-23 NOTE — Anesthesia Postprocedure Evaluation (Signed)
Anesthesia Post Note  Patient: [name]  Procedure(s) Performed: LAPAROSCOPIC CHOLECYSTECTOMY WITH INTRAOPERATIVE CHOLANGIOGRAM AND ICG DYE     Patient location during evaluation: PACU Anesthesia Type: General Level of consciousness: awake and patient cooperative Pain management: pain level controlled Vital Signs Assessment: post-procedure vital signs reviewed and stable Respiratory status: spontaneous breathing, nonlabored ventilation, respiratory function stable and patient connected to nasal cannula oxygen Cardiovascular status: blood pressure returned to baseline and stable Postop Assessment: no apparent nausea or vomiting Anesthetic complications: no   No notable events documented.  Last Vitals:  Vitals:   02/19/23 1000 02/19/23 1015  BP: 132/87 (!) 144/73  Pulse: (!) 54 (!) 54  Resp: 16 16  Temp:  36.6 C  SpO2: 100% 95%    Last Pain:  Vitals:   02/19/23 0557  TempSrc: Oral  PainSc: 5                  Roxann Vierra

## 2023-03-09 DIAGNOSIS — J3081 Allergic rhinitis due to animal (cat) (dog) hair and dander: Secondary | ICD-10-CM | POA: Diagnosis not present

## 2023-03-09 DIAGNOSIS — J3089 Other allergic rhinitis: Secondary | ICD-10-CM | POA: Diagnosis not present

## 2023-03-09 DIAGNOSIS — J301 Allergic rhinitis due to pollen: Secondary | ICD-10-CM | POA: Diagnosis not present

## 2023-03-11 ENCOUNTER — Other Ambulatory Visit: Payer: Self-pay | Admitting: Internal Medicine

## 2023-03-25 DIAGNOSIS — M13841 Other specified arthritis, right hand: Secondary | ICD-10-CM | POA: Diagnosis not present

## 2023-03-30 DIAGNOSIS — I10 Essential (primary) hypertension: Secondary | ICD-10-CM | POA: Diagnosis not present

## 2023-03-30 DIAGNOSIS — I509 Heart failure, unspecified: Secondary | ICD-10-CM | POA: Diagnosis not present

## 2023-03-30 DIAGNOSIS — J4540 Moderate persistent asthma, uncomplicated: Secondary | ICD-10-CM | POA: Diagnosis not present

## 2023-03-30 DIAGNOSIS — E7800 Pure hypercholesterolemia, unspecified: Secondary | ICD-10-CM | POA: Diagnosis not present

## 2023-03-30 DIAGNOSIS — G8929 Other chronic pain: Secondary | ICD-10-CM | POA: Diagnosis not present

## 2023-03-30 DIAGNOSIS — F339 Major depressive disorder, recurrent, unspecified: Secondary | ICD-10-CM | POA: Diagnosis not present

## 2023-04-08 ENCOUNTER — Other Ambulatory Visit: Payer: Self-pay | Admitting: Internal Medicine

## 2023-04-08 DIAGNOSIS — J3081 Allergic rhinitis due to animal (cat) (dog) hair and dander: Secondary | ICD-10-CM | POA: Diagnosis not present

## 2023-04-08 DIAGNOSIS — J301 Allergic rhinitis due to pollen: Secondary | ICD-10-CM | POA: Diagnosis not present

## 2023-04-08 DIAGNOSIS — J3089 Other allergic rhinitis: Secondary | ICD-10-CM | POA: Diagnosis not present

## 2023-04-10 ENCOUNTER — Other Ambulatory Visit: Payer: Self-pay

## 2023-04-13 DIAGNOSIS — E7800 Pure hypercholesterolemia, unspecified: Secondary | ICD-10-CM | POA: Diagnosis not present

## 2023-04-13 DIAGNOSIS — J4540 Moderate persistent asthma, uncomplicated: Secondary | ICD-10-CM | POA: Diagnosis not present

## 2023-04-13 DIAGNOSIS — I10 Essential (primary) hypertension: Secondary | ICD-10-CM | POA: Diagnosis not present

## 2023-04-13 DIAGNOSIS — I509 Heart failure, unspecified: Secondary | ICD-10-CM | POA: Diagnosis not present

## 2023-04-13 DIAGNOSIS — G8929 Other chronic pain: Secondary | ICD-10-CM | POA: Diagnosis not present

## 2023-04-15 DIAGNOSIS — J3089 Other allergic rhinitis: Secondary | ICD-10-CM | POA: Diagnosis not present

## 2023-05-01 DIAGNOSIS — Z96653 Presence of artificial knee joint, bilateral: Secondary | ICD-10-CM | POA: Diagnosis not present

## 2023-05-01 DIAGNOSIS — M7052 Other bursitis of knee, left knee: Secondary | ICD-10-CM | POA: Diagnosis not present

## 2023-05-08 DIAGNOSIS — I10 Essential (primary) hypertension: Secondary | ICD-10-CM | POA: Diagnosis not present

## 2023-05-08 DIAGNOSIS — J3089 Other allergic rhinitis: Secondary | ICD-10-CM | POA: Diagnosis not present

## 2023-05-08 DIAGNOSIS — J301 Allergic rhinitis due to pollen: Secondary | ICD-10-CM | POA: Diagnosis not present

## 2023-05-08 DIAGNOSIS — I509 Heart failure, unspecified: Secondary | ICD-10-CM | POA: Diagnosis not present

## 2023-05-08 DIAGNOSIS — J3081 Allergic rhinitis due to animal (cat) (dog) hair and dander: Secondary | ICD-10-CM | POA: Diagnosis not present

## 2023-05-08 DIAGNOSIS — G8929 Other chronic pain: Secondary | ICD-10-CM | POA: Diagnosis not present

## 2023-05-08 DIAGNOSIS — J4540 Moderate persistent asthma, uncomplicated: Secondary | ICD-10-CM | POA: Diagnosis not present

## 2023-05-08 DIAGNOSIS — E7800 Pure hypercholesterolemia, unspecified: Secondary | ICD-10-CM | POA: Diagnosis not present

## 2023-05-20 ENCOUNTER — Encounter: Payer: Self-pay | Admitting: Podiatry

## 2023-05-20 ENCOUNTER — Ambulatory Visit: Payer: Medicare PPO | Admitting: Podiatry

## 2023-05-20 DIAGNOSIS — L089 Local infection of the skin and subcutaneous tissue, unspecified: Secondary | ICD-10-CM

## 2023-05-20 DIAGNOSIS — S90821A Blister (nonthermal), right foot, initial encounter: Secondary | ICD-10-CM | POA: Diagnosis not present

## 2023-05-20 DIAGNOSIS — M25562 Pain in left knee: Secondary | ICD-10-CM | POA: Diagnosis not present

## 2023-05-20 DIAGNOSIS — M25561 Pain in right knee: Secondary | ICD-10-CM | POA: Diagnosis not present

## 2023-05-20 DIAGNOSIS — M79675 Pain in left toe(s): Secondary | ICD-10-CM

## 2023-05-20 DIAGNOSIS — B351 Tinea unguium: Secondary | ICD-10-CM

## 2023-05-20 DIAGNOSIS — M79676 Pain in unspecified toe(s): Secondary | ICD-10-CM

## 2023-05-20 NOTE — Progress Notes (Signed)
Subjective:   Patient ID: [name], female   DOB: 82 y.o.   MRN: [id number]   HPI Patient presents with 2 different problems with 1 being a blister of the distal portion of the right hallux and secondarily thick toenails both feet that she cannot take care of and are trim periodically   ROS      Objective:  Physical Exam  Neurovascular status unchanged from previous visits with patient found to have a distal blister of the right hallux localized no proximal edema erythema drainage noted and is found to have thick toenails of both feet that are painful when pressed     Assessment:  Probability for trauma to the right hallux which created a localized blister formation and mycotic nail infection 1-5 both feet     Plan:  Reviewed both conditions and I did advise on cushioning and dispensed a cushioning device for the right hallux and advised if any drainage or start to occur or proximal edema erythema I want to see back and I then went ahead and I debrided nailbeds bilateral no angiogenic bleeding

## 2023-05-27 ENCOUNTER — Telehealth: Payer: Self-pay | Admitting: Hematology and Oncology

## 2023-05-27 DIAGNOSIS — M5416 Radiculopathy, lumbar region: Secondary | ICD-10-CM | POA: Diagnosis not present

## 2023-05-27 DIAGNOSIS — M545 Low back pain, unspecified: Secondary | ICD-10-CM | POA: Diagnosis not present

## 2023-05-27 NOTE — Telephone Encounter (Signed)
Called twice and also called other contacts, left a message regarding rescheduled appointment time/dates

## 2023-05-28 DIAGNOSIS — M25562 Pain in left knee: Secondary | ICD-10-CM | POA: Diagnosis not present

## 2023-05-28 DIAGNOSIS — M25561 Pain in right knee: Secondary | ICD-10-CM | POA: Diagnosis not present

## 2023-06-03 ENCOUNTER — Ambulatory Visit: Payer: Medicare PPO | Admitting: Podiatry

## 2023-06-08 DIAGNOSIS — M25561 Pain in right knee: Secondary | ICD-10-CM | POA: Diagnosis not present

## 2023-06-08 DIAGNOSIS — J3089 Other allergic rhinitis: Secondary | ICD-10-CM | POA: Diagnosis not present

## 2023-06-08 DIAGNOSIS — M25562 Pain in left knee: Secondary | ICD-10-CM | POA: Diagnosis not present

## 2023-06-08 DIAGNOSIS — J3081 Allergic rhinitis due to animal (cat) (dog) hair and dander: Secondary | ICD-10-CM | POA: Diagnosis not present

## 2023-06-08 DIAGNOSIS — J301 Allergic rhinitis due to pollen: Secondary | ICD-10-CM | POA: Diagnosis not present

## 2023-06-09 ENCOUNTER — Other Ambulatory Visit: Payer: Medicare PPO

## 2023-06-09 ENCOUNTER — Ambulatory Visit: Payer: Medicare PPO | Admitting: Hematology and Oncology

## 2023-06-11 DIAGNOSIS — M25561 Pain in right knee: Secondary | ICD-10-CM | POA: Diagnosis not present

## 2023-06-11 DIAGNOSIS — M25562 Pain in left knee: Secondary | ICD-10-CM | POA: Diagnosis not present

## 2023-06-15 ENCOUNTER — Other Ambulatory Visit: Payer: Self-pay | Admitting: Internal Medicine

## 2023-06-15 DIAGNOSIS — Z6841 Body Mass Index (BMI) 40.0 and over, adult: Secondary | ICD-10-CM | POA: Diagnosis not present

## 2023-06-15 DIAGNOSIS — M545 Low back pain, unspecified: Secondary | ICD-10-CM | POA: Diagnosis not present

## 2023-06-15 DIAGNOSIS — M25561 Pain in right knee: Secondary | ICD-10-CM | POA: Diagnosis not present

## 2023-06-15 DIAGNOSIS — M25562 Pain in left knee: Secondary | ICD-10-CM | POA: Diagnosis not present

## 2023-06-15 DIAGNOSIS — M79604 Pain in right leg: Secondary | ICD-10-CM | POA: Diagnosis not present

## 2023-06-16 DIAGNOSIS — I10 Essential (primary) hypertension: Secondary | ICD-10-CM | POA: Diagnosis not present

## 2023-06-16 DIAGNOSIS — E7800 Pure hypercholesterolemia, unspecified: Secondary | ICD-10-CM | POA: Diagnosis not present

## 2023-06-16 DIAGNOSIS — G8929 Other chronic pain: Secondary | ICD-10-CM | POA: Diagnosis not present

## 2023-06-16 DIAGNOSIS — J4540 Moderate persistent asthma, uncomplicated: Secondary | ICD-10-CM | POA: Diagnosis not present

## 2023-06-16 DIAGNOSIS — I509 Heart failure, unspecified: Secondary | ICD-10-CM | POA: Diagnosis not present

## 2023-06-18 DIAGNOSIS — M25561 Pain in right knee: Secondary | ICD-10-CM | POA: Diagnosis not present

## 2023-06-18 DIAGNOSIS — M25562 Pain in left knee: Secondary | ICD-10-CM | POA: Diagnosis not present

## 2023-06-22 DIAGNOSIS — M7051 Other bursitis of knee, right knee: Secondary | ICD-10-CM | POA: Diagnosis not present

## 2023-06-22 DIAGNOSIS — M7052 Other bursitis of knee, left knee: Secondary | ICD-10-CM | POA: Diagnosis not present

## 2023-06-22 DIAGNOSIS — Z96653 Presence of artificial knee joint, bilateral: Secondary | ICD-10-CM | POA: Diagnosis not present

## 2023-06-22 DIAGNOSIS — M545 Low back pain, unspecified: Secondary | ICD-10-CM | POA: Diagnosis not present

## 2023-07-01 DIAGNOSIS — J4541 Moderate persistent asthma with (acute) exacerbation: Secondary | ICD-10-CM | POA: Diagnosis not present

## 2023-07-04 ENCOUNTER — Ambulatory Visit (HOSPITAL_COMMUNITY)
Admission: EM | Admit: 2023-07-04 | Discharge: 2023-07-04 | Disposition: A | Discharge status: Home / Self Care | Payer: Medicare PPO

## 2023-07-04 ENCOUNTER — Encounter (HOSPITAL_COMMUNITY): Payer: Self-pay

## 2023-07-04 DIAGNOSIS — T148XXA Other injury of unspecified body region, initial encounter: Secondary | ICD-10-CM | POA: Diagnosis not present

## 2023-07-04 DIAGNOSIS — R001 Bradycardia, unspecified: Secondary | ICD-10-CM

## 2023-07-04 MED ORDER — MUPIROCIN CALCIUM 2 % EX CREA: 1.0000 | TOPICAL_CREAM | Freq: Two times a day (BID) | CUTANEOUS | 0 refills | Status: AC

## 2023-07-04 MED ORDER — CEPHALEXIN 500 MG PO CAPS: 500.0000 mg | ORAL_CAPSULE | Freq: Two times a day (BID) | ORAL | 0 refills | Status: AC

## 2023-07-04 NOTE — Discharge Instructions (Addendum)
-   Apply the new cream twice a day -Take Keflex 500 mg 1 capsule by mouth twice a day for 5 days. -Report new or worsening symptoms to the primary care clinic or local emergency room.

## 2023-07-04 NOTE — ED Triage Notes (Signed)
Pt reports she burned her left elbow on a heating pad x 1 week.   Applied neosporin

## 2023-07-04 NOTE — ED Provider Notes (Signed)
[facility]    CSN: [id number] Arrival date & time: 07/04/23  1610    History   Chief Complaint No chief complaint on file.   HPI [name] is a 82 y.o. female presented to urgent care this evening with complaints of wound to left elbow.  Patient reports the wound has been there since Thursday of last week.  She burned her elbow with heating pads that she was using to warm her lower extremities with.  Patient has been applying Neosporin cream and covering the wound with dressings.  She reports no fever, chills, or any other sign of infection.  Patient reports pain to left elbow with activity, flexion/extension.  No  history of fracture today joint in the past.  Past Medical History:  Diagnosis Date   Allergic rhinitis    Arnold-Chiari malformation (HCC)    Asthma    CAD (coronary artery disease)    COPD (chronic obstructive pulmonary disease) (HCC)    Coughing    coughing since last 01-22-2019 , started on cefdinir bid x10 days, has 3 pills left today , reports she feels mcuh better , still coughing copiuus amonts of thick white sputum , denies fever nor chills, nor body aches .    DJD (degenerative joint disease)    GERD (gastroesophageal reflux disease)    Hiatal hernia    History of absence seizures    last confirmed seizure age 7     Hx of colonic polyps    Hypercholesteremia    Hypertension    Obesity    Positive PPD    Reflex sympathetic dystrophy    Sleep apnea    Varicose veins    Venous insufficiency     Patient Active Problem List   Diagnosis Date Noted   Abnormal CT scan 12/19/2022   Pain of upper abdomen 12/19/2022   Dilation of biliary tract 12/16/2022   Cellulitis of right leg 06/18/2022   Hyperglycemia 06/18/2022   Laceration of right index finger 05/08/2022   Acquired hallux valgus of left foot 11/22/2021   Primary localized osteoarthrosis of ankle and foot 11/22/2021   AKI (acute kidney injury) (HCC) 03/13/2021   Memory loss  03/13/2021   Anxiety 03/13/2021   Hypokalemia 03/05/2021   Generalized weakness 03/04/2021   Dizziness 03/04/2021   Vitamin D deficiency 01/16/2021   Cough 12/13/2020   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   Allergic rhinitis due to animal (cat) (dog) hair and dander 12/04/2020   Allergic rhinitis due to pollen 12/04/2020   Moderate persistent asthma, uncomplicated 12/04/2020   Radiculopathy, lumbar region 11/19/2020   Spondylolisthesis, lumbar region 11/19/2020   Leg cramping 11/06/2020   Degeneration of lumbar intervertebral disc 09/27/2020   Degenerative spondylolisthesis 08/15/2020   Pes anserinus bursitis of right knee 05/07/2020   Ulcer of toe, left, limited to breakdown of skin (HCC) 08/31/2019   Diastolic CHF, chronic (HCC) 08/31/2019   PVD (peripheral vascular disease) (HCC) 08/31/2019   Bilateral carpal tunnel syndrome 08/19/2019   Posterior neck pain 08/19/2019   Vertigo 04/08/2019   Anemia 04/08/2019   Stiffness of left knee 02/10/2019   Failed total knee, left (HCC) 02/03/2019   Failed total knee, left, initial encounter (HCC) 02/03/2019   Carpal tunnel syndrome of left wrist 01/21/2019   Pain of left hand 01/21/2019   Preop exam for internal medicine 11/02/2018   Left hand paresthesia 11/02/2018   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) 11/01/2018   Hyperopia with presbyopia, bilateral 06/29/2018  Abdominal pain, lower 03/02/2018   Diarrhea 03/02/2018   Encounter for well adult exam with abnormal findings 11/14/2017   Arnold-Chiari malformation (HCC)    Asthma    CAD (coronary artery disease)    COPD (chronic obstructive pulmonary disease) (HCC)    DJD (degenerative joint disease)    History of absence seizures    Hx of colonic polyps    Hypercholesteremia    Positive PPD    Venous insufficiency    Pain in left lower leg 07/20/2017   Cellulitis of left lower extremity 06/18/2017   Bilateral lower extremity edema 04/30/2017   Shingles 02/26/2017   COPD  exacerbation (HCC) 01/07/2017   S/P TKR (total knee replacement), bilateral 01/07/2017   Right arm pain 05/16/2015   Generalized anxiety disorder 08/31/2014   Varicose veins of lower extremities with other complications 01/06/2012   Edema 08/07/2011   MENORRHAGIA, POSTMENOPAUSAL 08/13/2010   CIGARETTE SMOKER 06/11/2010   CARDIAC MURMUR 11/02/2009   Bradycardia 11/01/2009   CHEST PAIN 11/01/2009   Allergic rhinitis 04/19/2009   Chronic obstructive airway disease with asthma (HCC) 04/18/2009   LEG CRAMPS, NOCTURNAL 11/02/2008   Essential hypertension 01/17/2008   COLONIC POLYPS 01/14/2008   Body mass index (BMI) 36.0-36.9, adult 01/14/2008   Reflex sympathetic dystrophy 01/14/2008   Coronary atherosclerosis 01/14/2008   GERD 01/14/2008   Osteoarthritis 01/14/2008   BACK PAIN, LUMBAR 01/14/2008   SEIZURES, HX OF 01/14/2008    Past Surgical History:  Procedure Laterality Date   ABDOMINAL HYSTERECTOMY     CHOLECYSTECTOMY N/A 02/19/2023   Procedure: LAPAROSCOPIC CHOLECYSTECTOMY WITH INTRAOPERATIVE CHOLANGIOGRAM AND ICG DYE;  Surgeon: Gaynelle Adu, MD;  Location: [facility] OR;  Service: General;  Laterality: N/A;   cspine surgery  1993   for arnold-chiari malformation   IR BONE MARROW BIOPSY & ASPIRATION  01/28/2023   JOINT REPLACEMENT  06/05/11   Left total knee replacement   right knee arthroscopy  12/2007   Dr. Shelle Iron   right total knee replacement  05/2008   Dr. Shelle Iron   TOTAL KNEE REVISION Left 02/03/2019   Procedure: LEFT TOTAL KNEE REVISION;  Surgeon: Samson Frederic, MD;  Location: [facility] ORS;  Service: Orthopedics;  Laterality: Left;    OB History   No obstetric history on file.      Home Medications    Prior to Admission medications   Medication Sig Start Date End Date Taking? Authorizing Provider  cephALEXin (KEFLEX) 500 MG capsule Take 1 capsule (500 mg total) by mouth 2 (two) times daily for 5 days. 07/04/23 07/09/23 Yes Eleonore Chiquito, FNP  mupirocin cream (BACTROBAN) 2 % Apply  1 Application topically 2 (two) times daily. 07/04/23  Yes Eleonore Chiquito, FNP  predniSONE (DELTASONE) 20 MG tablet 2 tablets Orally Once a day for 5 days 07/01/23  Yes [provider]  acetaminophen (TYLENOL) 500 MG tablet Take 1,000 mg by mouth every 6 (six) hours as needed for moderate pain.    [provider]  albuterol (PROVENTIL HFA;VENTOLIN HFA) 108 (90 BASE) MCG/ACT inhaler Inhale 2 puffs into the lungs every 4 (four) hours as needed. For wheeze or shortness of breath 03/26/12   Michele Mcalpine, MD  alendronate (FOSAMAX) 70 MG tablet TAKE ONE TABLET BY MOUTH ONCE WEEKLY ON [facility] 02/10/23   Corwin Levins, MD  aspirin 81 MG tablet Take 81 mg by mouth daily.    [provider]  azelastine (ASTELIN) 0.1 % nasal spray Place 1 spray into both nostrils 2 (two) times daily  as needed for rhinitis or allergies. 11/07/18   [provider]  budesonide-formoterol (SYMBICORT) 80-4.5 MCG/ACT inhaler Inhale 2 puffs into the lungs 2 (two) times daily as needed (shortness of breath).    [provider]  citalopram (CELEXA) 10 MG tablet Take 1 tablet (10 mg total) by mouth daily. Patient taking differently: Take 20 mg by mouth daily. 05/07/21 02/06/23  Corwin Levins, MD  [employer] D3 50 MCG (2000 UT) CAPS Take 1 capsule by mouth daily. 01/09/21   [provider]  donepezil (ARICEPT ODT) 5 MG disintegrating tablet Take 1 tablet (5 mg total) by mouth at bedtime. Patient not taking: Reported on 02/06/2023 05/07/21   Corwin Levins, MD  donepezil (ARICEPT) 5 MG tablet Take 5 mg by mouth at bedtime.    [provider]  EPINEPHrine 0.3 mg/0.3 mL IJ SOAJ injection Inject 0.3 mg into the muscle as needed for anaphylaxis.    [provider]  famotidine (PEPCID) 20 MG tablet TAKE ONE TABLET BY MOUTH TWICE DAILY 04/10/23   Corwin Levins, MD  ferrous sulfate 325 (65 FE) MG EC tablet Take 325 mg by mouth daily.    [provider]  fluticasone (FLONASE) 50  MCG/ACT nasal spray Place 1 spray into both nostrils 2 (two) times daily as needed for allergies or rhinitis.    [provider]  furosemide (LASIX) 40 MG tablet TAKE 2 TABLETS BY MOUTH EVERY DAY NEED OFFICE VISIT 12/29/22   Corwin Levins, MD  lidocaine (LIDODERM) 5 % Place 1 patch onto the skin daily. Remove & Discard patch within 12 hours or as directed by MD Patient taking differently: Place 1 patch onto the skin daily as needed (pain). Remove & Discard patch within 12 hours or as directed by MD 10/30/22   Linwood Dibbles, MD  loperamide (IMODIUM A-D) 2 MG tablet Take 2 mg by mouth as needed for diarrhea or loose stools.    [provider]  meloxicam (MOBIC) 15 MG tablet TAKE ONE TABLET BY MOUTH ONCE daily AS NEEDED FOR PAIN 02/10/23   Corwin Levins, MD  montelukast (SINGULAIR) 10 MG tablet TAKE 1 TABLET BY MOUTH EVERY DAY IN THE EVENING 11/27/22   Corwin Levins, MD  pantoprazole (PROTONIX) 40 MG tablet TAKE 1 TABLET BY MOUTH EVERY DAY 05/20/22   Corwin Levins, MD  potassium chloride SA (KLOR-CON M20) 20 MEQ tablet Take 1 tablet (20 mEq total) by mouth 2 (two) times daily. Patient taking differently: Take 20-40 mEq by mouth See admin instructions. Take 20 meq twice daily. May increase to 40 meq twice daily as needed for increased fluid retention 05/07/21   Corwin Levins, MD  rosuvastatin (CRESTOR) 40 MG tablet Take 1 tablet (40 mg total) by mouth daily. 05/07/21 02/02/23  Corwin Levins, MD  Tiotropium Bromide Monohydrate (SPIRIVA RESPIMAT) 1.25 MCG/ACT AERS Inhale 1.25 mcg into the lungs daily.    [provider]  tiZANidine (ZANAFLEX) 2 MG tablet Take 1 tablet (2 mg total) by mouth every 6 (six) hours as needed for muscle spasms. 06/07/21   Corwin Levins, MD  traZODone (DESYREL) 100 MG tablet TAKE ONE TABLET BY MOUTH EVERYDAY AT BEDTIME 04/10/23   Corwin Levins, MD    Family History Family History  Problem Relation Age of Onset   Heart disease Mother    Stroke Mother    Stroke  Father    Clotting disorder Father    Colon cancer Neg Hx  Stomach cancer Neg Hx     Social History Social History   Tobacco Use   Smoking status: Former    Current packs/day: 0.00    Average packs/day: 0.3 packs/day for 12.0 years (3.6 ttl pk-yrs)    Types: Cigarettes    Start date: 01/02/2000    Quit date: 01/02/2012    Years since quitting: 11.5   Smokeless tobacco: Never   Tobacco comments:    1 pack per week  Vaping Use   Vaping status: Never Used  Substance Use Topics   Alcohol use: No   Drug use: No     Allergies   Patient has no known allergies.   Review of Systems Review of Systems  Constitutional: Negative.   Eyes: Negative.   Respiratory: Negative.    Cardiovascular: Negative.   Gastrointestinal: Negative.   Endocrine: Negative.   Genitourinary: Negative.   Skin:  Positive for wound.  Neurological: Negative.    Physical Exam Triage Vital Signs ED Triage Vitals  Encounter Vitals Group     BP 07/04/23 1710 127/65     Systolic BP Percentile --      Diastolic BP Percentile --      Pulse Rate 07/04/23 1710 (!) 46     Resp 07/04/23 1710 14     Temp 07/04/23 1710 98.1 F (36.7 C)     Temp Source 07/04/23 1710 Oral     SpO2 07/04/23 1710 94 %     Weight --      Height --      Head Circumference --      Peak Flow --      Pain Score 07/04/23 1708 9     Pain Loc --      Pain Education --      Exclude from Growth Chart --    No data found.  Updated Vital Signs BP 127/65 (BP Location: Left Wrist)   Pulse (!) 46   Temp 98.1 F (36.7 C) (Oral)   Resp 14   SpO2 94%   Visual Acuity Right Eye Distance:   Left Eye Distance:   Bilateral Distance:    Right Eye Near:   Left Eye Near:    Bilateral Near:     Physical Exam Constitutional:      Appearance: Normal appearance.  Cardiovascular:     Rate and Rhythm: Regular rhythm. Bradycardia present.     Pulses: Normal pulses.     Heart sounds: Normal heart sounds.  Pulmonary:     Effort:  Pulmonary effort is normal.     Breath sounds: Normal breath sounds.  Abdominal:     General: Bowel sounds are normal.  Skin:    Findings: Lesion present.          Comments: There is a quarter size open skin to left elbow with redness to its edges.  No oozing, discharge, but there is pain with palpation to the elbow.  There is tenderness with flexion and extension of the elbow.  Patient is able to flex and extend her left wrist and left  shoulder.  Neurological:     General: No focal deficit present.     Mental Status: She is alert and oriented to person, place, and time.  Psychiatric:        Mood and Affect: Mood normal.        Behavior: Behavior normal.     UC Treatments / Results  Labs (all labs ordered are listed, but only abnormal results  are displayed) Labs Reviewed - No data to display  EKG   Radiology No results found.  Procedures Procedures (including critical care time)  Medications Ordered in UC Medications - No data to display  Initial Impression / Assessment and Plan / UC Course  I have reviewed the triage vital signs and the nursing notes. Pertinent labs & imaging results that were available during my care of the patient were reviewed by me and considered in my medical decision making (see chart for details).  Assessment: Patient is a 82 year old female with a history of open skin wound to left elbow for 9 days.  She has been applying Neosporin cream to the wound and covering it with dressing.  She is managing the wound well but the wound has some red edges, which could affect the joint (elbow).  Patient with pulse rate of 46 is a symptomatic.  She has chronic bradycardia   Plan: 1) Open wound of skin:   -Apply Bactroban 2% twice per day  -Take Keflex 500 mg 1 tablet by mouth twice a day  -Stay hydrated and report new or worsening symptoms to primary care providers or local emergency room.  2) Chronic Bradycardia:  -Follow-up with primary care  -Stay  hydrated and for new symptoms call 911 or go to the nearest emergency room.   Final diagnoses:  None   Discharge Instructions   None    ED Prescriptions     Medication Sig Dispense Auth. Provider   mupirocin cream (BACTROBAN) 2 % Apply 1 Application topically 2 (two) times daily. 15 g Eleonore Chiquito, FNP   cephALEXin (KEFLEX) 500 MG capsule Take 1 capsule (500 mg total) by mouth 2 (two) times daily for 5 days. 10 capsule Eleonore Chiquito, FNP      PDMP not reviewed this encounter.   Eleonore Chiquito, FNP 07/04/23 1744

## 2023-07-08 DIAGNOSIS — J3089 Other allergic rhinitis: Secondary | ICD-10-CM | POA: Diagnosis not present

## 2023-07-08 DIAGNOSIS — J3081 Allergic rhinitis due to animal (cat) (dog) hair and dander: Secondary | ICD-10-CM | POA: Diagnosis not present

## 2023-07-08 DIAGNOSIS — J301 Allergic rhinitis due to pollen: Secondary | ICD-10-CM | POA: Diagnosis not present

## 2023-07-14 DIAGNOSIS — M5136 Other intervertebral disc degeneration, lumbar region: Secondary | ICD-10-CM | POA: Diagnosis not present

## 2023-07-14 DIAGNOSIS — M5416 Radiculopathy, lumbar region: Secondary | ICD-10-CM | POA: Diagnosis not present

## 2023-07-20 DIAGNOSIS — J3081 Allergic rhinitis due to animal (cat) (dog) hair and dander: Secondary | ICD-10-CM | POA: Diagnosis not present

## 2023-07-20 DIAGNOSIS — J301 Allergic rhinitis due to pollen: Secondary | ICD-10-CM | POA: Diagnosis not present

## 2023-07-20 DIAGNOSIS — J3089 Other allergic rhinitis: Secondary | ICD-10-CM | POA: Diagnosis not present

## 2023-07-21 DIAGNOSIS — M5416 Radiculopathy, lumbar region: Secondary | ICD-10-CM | POA: Diagnosis not present

## 2023-07-22 ENCOUNTER — Inpatient Hospital Stay: Payer: Medicare PPO | Admitting: Hematology and Oncology

## 2023-07-22 ENCOUNTER — Inpatient Hospital Stay: Payer: Medicare PPO | Attending: Hematology and Oncology

## 2023-07-27 DIAGNOSIS — M5416 Radiculopathy, lumbar region: Secondary | ICD-10-CM | POA: Diagnosis not present

## 2023-07-27 DIAGNOSIS — U071 COVID-19: Secondary | ICD-10-CM | POA: Diagnosis not present

## 2023-07-27 DIAGNOSIS — J069 Acute upper respiratory infection, unspecified: Secondary | ICD-10-CM | POA: Diagnosis not present

## 2023-07-27 DIAGNOSIS — B9789 Other viral agents as the cause of diseases classified elsewhere: Secondary | ICD-10-CM | POA: Diagnosis not present

## 2023-07-27 DIAGNOSIS — R059 Cough, unspecified: Secondary | ICD-10-CM | POA: Diagnosis not present

## 2023-07-30 DIAGNOSIS — H2513 Age-related nuclear cataract, bilateral: Secondary | ICD-10-CM | POA: Diagnosis not present

## 2023-07-30 DIAGNOSIS — H5203 Hypermetropia, bilateral: Secondary | ICD-10-CM | POA: Diagnosis not present

## 2023-08-04 NOTE — Progress Notes (Signed)
HPI: FU coronary disease. Cardiac catheterization 2/05 showed mild irregularities in the left main. There was a 40% stenosis in the proximal LAD and a distal 30% lesion. The first diagonal had a 30-40% proximal lesion. The ostium of the LAD by intravascular ultrasound had a luminal area of 3.9 mm squared and percent stenosis 60%. She had nonobstructive disease in the left circumflex and right coronary. Her LV function was normal. Nuclear study 1/16 showed EF 58 and normal perfusion. Echocardiogram April 2019 showed normal LV function, mild left ventricular hypertrophy, grade 1 diastolic dysfunction, mild right atrial and right ventricular enlargement and mild tricuspid regurgitation.  ABIs May 2019 normal.  Venous Dopplers November 2021 showed no DVT.  Since I last saw her, she denies increased dyspnea, chest pain, palpitations or syncope.  She does note increased pedal edema.  Current Outpatient Medications  Medication Sig Dispense Refill   acetaminophen (TYLENOL) 500 MG tablet Take 1,000 mg by mouth every 6 (six) hours as needed for moderate pain.     albuterol (PROVENTIL HFA;VENTOLIN HFA) 108 (90 BASE) MCG/ACT inhaler Inhale 2 puffs into the lungs every 4 (four) hours as needed. For wheeze or shortness of breath 1 Inhaler 6   alendronate (FOSAMAX) 70 MG tablet TAKE ONE TABLET BY MOUTH ONCE WEEKLY ON WEDNESDAY 4 tablet 1   aspirin 81 MG tablet Take 81 mg by mouth daily.     azelastine (ASTELIN) 0.1 % nasal spray Place 1 spray into both nostrils 2 (two) times daily as needed for rhinitis or allergies.     budesonide-formoterol (SYMBICORT) 80-4.5 MCG/ACT inhaler Inhale 2 puffs into the lungs 2 (two) times daily as needed (shortness of breath).     CVS D3 50 MCG (2000 UT) CAPS Take 1 capsule by mouth daily.     donepezil (ARICEPT ODT) 5 MG disintegrating tablet Take 1 tablet (5 mg total) by mouth at bedtime. 90 tablet 3   donepezil (ARICEPT) 5 MG tablet Take 5 mg by mouth at bedtime.      EPINEPHrine 0.3 mg/0.3 mL IJ SOAJ injection Inject 0.3 mg into the muscle as needed for anaphylaxis.     famotidine (PEPCID) 20 MG tablet TAKE ONE TABLET BY MOUTH TWICE DAILY 60 tablet 1   ferrous sulfate 325 (65 FE) MG EC tablet Take 325 mg by mouth daily.     fluticasone (FLONASE) 50 MCG/ACT nasal spray Place 1 spray into both nostrils 2 (two) times daily as needed for allergies or rhinitis.     furosemide (LASIX) 40 MG tablet TAKE 2 TABLETS BY MOUTH EVERY DAY NEED OFFICE VISIT 180 tablet 2   lidocaine (LIDODERM) 5 % Place 1 patch onto the skin daily. Remove & Discard patch within 12 hours or as directed by MD (Patient taking differently: Place 1 patch onto the skin daily as needed (pain). Remove & Discard patch within 12 hours or as directed by MD) 7 patch 0   loperamide (IMODIUM A-D) 2 MG tablet Take 2 mg by mouth as needed for diarrhea or loose stools.     meloxicam (MOBIC) 15 MG tablet TAKE ONE TABLET BY MOUTH ONCE daily AS NEEDED FOR PAIN 30 tablet 2   montelukast (SINGULAIR) 10 MG tablet TAKE 1 TABLET BY MOUTH EVERY DAY IN THE EVENING 90 tablet 1   mupirocin cream (BACTROBAN) 2 % Apply 1 Application topically 2 (two) times daily. 15 g 0   pantoprazole (PROTONIX) 40 MG tablet TAKE 1 TABLET BY MOUTH EVERY DAY 90 tablet  1   potassium chloride SA (KLOR-CON M20) 20 MEQ tablet Take 1 tablet (20 mEq total) by mouth 2 (two) times daily. (Patient taking differently: Take 20-40 mEq by mouth See admin instructions. Take 20 meq twice daily. May increase to 40 meq twice daily as needed for increased fluid retention) 180 tablet 3   Tiotropium Bromide Monohydrate (SPIRIVA RESPIMAT) 1.25 MCG/ACT AERS Inhale 1.25 mcg into the lungs daily.     tiZANidine (ZANAFLEX) 2 MG tablet Take 1 tablet (2 mg total) by mouth every 6 (six) hours as needed for muscle spasms. 40 tablet 1   traZODone (DESYREL) 100 MG tablet TAKE ONE TABLET BY MOUTH EVERYDAY AT BEDTIME 90 tablet 2   citalopram (CELEXA) 10 MG tablet Take 1 tablet  (10 mg total) by mouth daily. (Patient taking differently: Take 20 mg by mouth daily.) 90 tablet 3   predniSONE (DELTASONE) 20 MG tablet 2 tablets Orally Once a day for 5 days (Patient not taking: Reported on 08/11/2023)     rosuvastatin (CRESTOR) 40 MG tablet Take 1 tablet (40 mg total) by mouth daily. 90 tablet 3   No current facility-administered medications for this visit.   Facility-Administered Medications Ordered in Other Visits  Medication Dose Route Frequency Provider Last Rate Last Admin   Spy Agent Green / Firefly Optime  1.25 mg Intravenous Once Gaynelle Adu, MD         Past Medical History:  Diagnosis Date   Allergic rhinitis    Arnold-Chiari malformation (HCC)    Asthma    CAD (coronary artery disease)    COPD (chronic obstructive pulmonary disease) (HCC)    Coughing    coughing since last 01-22-2019 , started on cefdinir bid x10 days, has 3 pills left today , reports she feels mcuh better , still coughing copiuus amonts of thick white sputum , denies fever nor chills, nor body aches .    DJD (degenerative joint disease)    GERD (gastroesophageal reflux disease)    Hiatal hernia    History of absence seizures    last confirmed seizure age 42     Hx of colonic polyps    Hypercholesteremia    Hypertension    Obesity    Positive PPD    Reflex sympathetic dystrophy    Sleep apnea    Varicose veins    Venous insufficiency     Past Surgical History:  Procedure Laterality Date   ABDOMINAL HYSTERECTOMY     CHOLECYSTECTOMY N/A 02/19/2023   Procedure: LAPAROSCOPIC CHOLECYSTECTOMY WITH INTRAOPERATIVE CHOLANGIOGRAM AND ICG DYE;  Surgeon: Gaynelle Adu, MD;  [facility] OR;  Service: General;  Laterality: N/A;   cspine surgery  1993   for arnold-chiari malformation   IR BONE MARROW BIOPSY & ASPIRATION  01/28/2023   JOINT REPLACEMENT  06/05/11   Left total knee replacement   right knee arthroscopy  12/2007   Dr. Shelle Iron   right total knee replacement  05/2008   Dr. Shelle Iron    TOTAL KNEE REVISION Left 02/03/2019   Procedure: LEFT TOTAL KNEE REVISION;  Surgeon: Samson Frederic, MD;  Location: [facility];  Service: Orthopedics;  Laterality: Left;    Social History   Socioeconomic History   Marital status: Widowed    Spouse name: Not on file   Number of children: 2   Years of education: 35   Highest education level: Not on file  Occupational History   Occupation: retired  Tobacco Use   Smoking status: Former    Current  packs/day: 0.00    Average packs/day: 0.3 packs/day for 12.0 years (3.6 ttl pk-yrs)    Types: Cigarettes    Start date: 01/02/2000    Quit date: 01/02/2012    Years since quitting: 11.6   Smokeless tobacco: Never   Tobacco comments:    1 pack per week  Vaping Use   Vaping status: Never Used  Substance and Sexual Activity   Alcohol use: No   Drug use: No   Sexual activity: Not on file  Other Topics Concern   Not on file  Social History Narrative   Fun/Hobby: Playing cards and mingling.   Denies abuse and feels safe at home.    Social Determinants of Health   Financial Resource Strain: Not on file  Food Insecurity: Not on file  Transportation Needs: Not on file  Physical Activity: Not on file  Stress: Not on file  Social Connections: Not on file  Intimate Partner Violence: Not on file    Family History  Problem Relation Age of Onset   Heart disease Mother    Stroke Mother    Stroke Father    Clotting disorder Father    Colon cancer Neg Hx    Stomach cancer Neg Hx     ROS: no fevers or chills, productive cough, hemoptysis, dysphasia, odynophagia, melena, hematochezia, dysuria, hematuria, rash, seizure activity, orthopnea, PND, pedal edema, claudication. Remaining systems are negative.  Physical Exam: Well-developed well-nourished in no acute distress.  Skin is warm and dry.  HEENT is normal.  Neck is supple.  Chest is clear to auscultation with normal expansion.  Cardiovascular exam is regular rate and rhythm.  Abdominal  exam nontender or distended. No masses palpated. Extremities show 1+ edema. neuro grossly intact   A/P  1 coronary artery disease-patient doing well from a symptomatic standpoint.  Continue aspirin.  2 hyperlipidemia-patient is complaining of muscle pain and arthralgias.  Unclear if this is related to her Crestor but will hold for 4 weeks.  If symptoms do not improve will resume and check lipids and liver 8 weeks later.  If her symptoms do improve we will try a different medication (either a different statin, Zetia or PCSK9 inhibitor).  3 hypertension-patient's blood pressure is controlled.  Continue present medications and follow.  Check potassium and renal function.  4 history of lower extremity edema-edema is worse.  We will increase Lasix to 80 mg daily.  Increase potassium to 20 mill equivalents twice daily.  Check potassium and renal function in 1 week.  Olga Millers, MD

## 2023-08-08 ENCOUNTER — Other Ambulatory Visit: Payer: Self-pay | Admitting: Family

## 2023-08-08 ENCOUNTER — Other Ambulatory Visit: Payer: Self-pay | Admitting: Internal Medicine

## 2023-08-10 DIAGNOSIS — M5416 Radiculopathy, lumbar region: Secondary | ICD-10-CM | POA: Diagnosis not present

## 2023-08-10 DIAGNOSIS — M792 Neuralgia and neuritis, unspecified: Secondary | ICD-10-CM | POA: Diagnosis not present

## 2023-08-11 ENCOUNTER — Encounter: Payer: Self-pay | Admitting: Cardiology

## 2023-08-11 ENCOUNTER — Ambulatory Visit: Payer: Medicare PPO | Attending: Cardiology | Admitting: Cardiology

## 2023-08-11 VITALS — BP 120/70 | HR 47 | Ht 64.0 in | Wt 220.0 lb

## 2023-08-11 DIAGNOSIS — E785 Hyperlipidemia, unspecified: Secondary | ICD-10-CM

## 2023-08-11 DIAGNOSIS — I2510 Atherosclerotic heart disease of native coronary artery without angina pectoris: Secondary | ICD-10-CM

## 2023-08-11 DIAGNOSIS — K219 Gastro-esophageal reflux disease without esophagitis: Secondary | ICD-10-CM | POA: Diagnosis not present

## 2023-08-11 DIAGNOSIS — J3089 Other allergic rhinitis: Secondary | ICD-10-CM | POA: Diagnosis not present

## 2023-08-11 DIAGNOSIS — R052 Subacute cough: Secondary | ICD-10-CM | POA: Diagnosis not present

## 2023-08-11 DIAGNOSIS — R600 Localized edema: Secondary | ICD-10-CM

## 2023-08-11 DIAGNOSIS — J4540 Moderate persistent asthma, uncomplicated: Secondary | ICD-10-CM | POA: Diagnosis not present

## 2023-08-11 DIAGNOSIS — I10 Essential (primary) hypertension: Secondary | ICD-10-CM

## 2023-08-11 MED ORDER — POTASSIUM CHLORIDE CRYS ER 20 MEQ PO TBCR: 20.0000 meq | EXTENDED_RELEASE_TABLET | Freq: Two times a day (BID) | ORAL | 3 refills | Status: DC

## 2023-08-11 MED ORDER — FUROSEMIDE 40 MG PO TABS: 80.0000 mg | ORAL_TABLET | Freq: Every day | ORAL | 3 refills | Status: DC

## 2023-08-11 NOTE — Patient Instructions (Signed)
Medication Instructions:   TAKE FUROSEMIDE 80 MG ONCE DAILY= 2 OF THE 40 MG TABLETS ONCE DAILY  TAKE POTASSIUM 20 MEQ TWICE DAILY  STOP ROSUVASTATIN AND CALL AFTER 4 WEEKS OFF THE MEDICATION  *If you need a refill on your cardiac medications before your next appointment, please call your pharmacy*   Lab Work:  Your physician recommends that you return for lab work in: ONE WEEK-DO NOT NEED TO FAST  If you have labs (blood work) drawn today and your tests are completely normal, you will receive your results only by: MyChart Message (if you have MyChart) OR A paper copy in the mail If you have any lab test that is abnormal or we need to change your treatment, we will call you to review the results.   Follow-Up: At [facility], you and your health needs are our priority.  As part of our continuing mission to provide you with exceptional heart care, we have created designated Provider Care Teams.  These Care Teams include your primary Cardiologist (physician) and Advanced Practice Providers (APPs -  Physician Assistants and Nurse Practitioners) who all work together to provide you with the care you need, when you need it.  We recommend signing up for the patient portal called "MyChart".  Sign up information is provided on this After Visit Summary.  MyChart is used to connect with patients for Virtual Visits (Telemedicine).  Patients are able to view lab/test results, encounter notes, upcoming appointments, etc.  Non-urgent messages can be sent to your provider as well.   To learn more about what you can do with MyChart, go to [email or web address].    Your next appointment:   6 month(s)  Provider:   Olga Millers, MD

## 2023-08-18 DIAGNOSIS — R600 Localized edema: Secondary | ICD-10-CM | POA: Diagnosis not present

## 2023-08-18 DIAGNOSIS — J3081 Allergic rhinitis due to animal (cat) (dog) hair and dander: Secondary | ICD-10-CM | POA: Diagnosis not present

## 2023-08-18 DIAGNOSIS — J301 Allergic rhinitis due to pollen: Secondary | ICD-10-CM | POA: Diagnosis not present

## 2023-08-18 DIAGNOSIS — J3089 Other allergic rhinitis: Secondary | ICD-10-CM | POA: Diagnosis not present

## 2023-08-21 ENCOUNTER — Encounter: Payer: Self-pay | Admitting: *Deleted

## 2023-08-25 ENCOUNTER — Ambulatory Visit: Payer: Medicare PPO | Admitting: Podiatry

## 2023-08-25 DIAGNOSIS — J3081 Allergic rhinitis due to animal (cat) (dog) hair and dander: Secondary | ICD-10-CM | POA: Diagnosis not present

## 2023-08-25 DIAGNOSIS — M79674 Pain in right toe(s): Secondary | ICD-10-CM | POA: Diagnosis not present

## 2023-08-25 DIAGNOSIS — M79675 Pain in left toe(s): Secondary | ICD-10-CM | POA: Diagnosis not present

## 2023-08-25 DIAGNOSIS — Z89422 Acquired absence of other left toe(s): Secondary | ICD-10-CM

## 2023-08-25 DIAGNOSIS — B351 Tinea unguium: Secondary | ICD-10-CM | POA: Diagnosis not present

## 2023-08-25 DIAGNOSIS — I739 Peripheral vascular disease, unspecified: Secondary | ICD-10-CM

## 2023-08-25 DIAGNOSIS — J301 Allergic rhinitis due to pollen: Secondary | ICD-10-CM | POA: Diagnosis not present

## 2023-08-25 DIAGNOSIS — J3089 Other allergic rhinitis: Secondary | ICD-10-CM | POA: Diagnosis not present

## 2023-08-25 NOTE — Progress Notes (Signed)
Subjective:  Patient ID: [name], female    DOB: Aug 19, 1941,   MRN: [id number]  No chief complaint on file.   82 y.o. female presents for  concern of thickened elongated and painful nails that are difficult to trim. Requesting to have them trimmed today. Relates burning and tingling in their feet. Patient iwith history of PVD and amputation of left second toe.   PCP:  [facility]     . Denies any other pedal complaints. Denies n/v/f/c.   Past Medical History:  Diagnosis Date   Allergic rhinitis    Arnold-Chiari malformation (HCC)    Asthma    CAD (coronary artery disease)    COPD (chronic obstructive pulmonary disease) (HCC)    Coughing    coughing since last 01-22-2019 , started on cefdinir bid x10 days, has 3 pills left today , reports she feels mcuh better , still coughing copiuus amonts of thick white sputum , denies fever nor chills, nor body aches .    DJD (degenerative joint disease)    GERD (gastroesophageal reflux disease)    Hiatal hernia    History of absence seizures    last confirmed seizure age 88     Hx of colonic polyps    Hypercholesteremia    Hypertension    Obesity    Positive PPD    Reflex sympathetic dystrophy    Sleep apnea    Varicose veins    Venous insufficiency     Objective:  Physical Exam: Vascular: DP/PT pulses 2/4 bilateral. CFT <3 seconds. Normal hair growth on digits. No edema.  Skin. No lacerations or abrasions bilateral feet. Remaining nails 1-5 bilateral elongated thickened and dystrophic with subungual debris.  Musculoskeletal: MMT 5/5 bilateral lower extremities in DF, PF, Inversion and Eversion. Deceased ROM in DF of ankle joint. Amputation of left second toe with severe HAV deformity noted.  Neurological: Sensation intact to light touch.   Assessment:   1. Pain due to onychomycosis of toenails of both feet   2. PVD (peripheral vascular disease) (HCC)   3. Status post amputation of lesser toe of left foot  (HCC)      Plan:  Patient was evaluated and treated and all questions answered. -Toecaps given for soreness on great toe on right and left third toe.  -Mechanically debrided all nails 1-5 bilateral using sterile nail nipper and filed with dremel without incident  -Answered all patient questions -Patient to return  in 3 months for at risk foot care -Patient advised to call the office if any problems or questions arise in the meantime.   Louann Sjogren, DPM

## 2023-08-29 DIAGNOSIS — M7918 Myalgia, other site: Secondary | ICD-10-CM | POA: Diagnosis not present

## 2023-09-01 DIAGNOSIS — J301 Allergic rhinitis due to pollen: Secondary | ICD-10-CM | POA: Diagnosis not present

## 2023-09-01 DIAGNOSIS — J3081 Allergic rhinitis due to animal (cat) (dog) hair and dander: Secondary | ICD-10-CM | POA: Diagnosis not present

## 2023-09-01 DIAGNOSIS — J3089 Other allergic rhinitis: Secondary | ICD-10-CM | POA: Diagnosis not present

## 2023-09-08 DIAGNOSIS — J3081 Allergic rhinitis due to animal (cat) (dog) hair and dander: Secondary | ICD-10-CM | POA: Diagnosis not present

## 2023-09-08 DIAGNOSIS — J3089 Other allergic rhinitis: Secondary | ICD-10-CM | POA: Diagnosis not present

## 2023-09-08 DIAGNOSIS — J301 Allergic rhinitis due to pollen: Secondary | ICD-10-CM | POA: Diagnosis not present

## 2023-09-10 DIAGNOSIS — Z79899 Other long term (current) drug therapy: Secondary | ICD-10-CM | POA: Diagnosis not present

## 2023-09-10 DIAGNOSIS — E7800 Pure hypercholesterolemia, unspecified: Secondary | ICD-10-CM | POA: Diagnosis not present

## 2023-09-10 DIAGNOSIS — J4489 Other specified chronic obstructive pulmonary disease: Secondary | ICD-10-CM | POA: Diagnosis not present

## 2023-09-10 DIAGNOSIS — Z0000 Encounter for general adult medical examination without abnormal findings: Secondary | ICD-10-CM | POA: Diagnosis not present

## 2023-09-10 DIAGNOSIS — I2510 Atherosclerotic heart disease of native coronary artery without angina pectoris: Secondary | ICD-10-CM | POA: Diagnosis not present

## 2023-09-10 DIAGNOSIS — I739 Peripheral vascular disease, unspecified: Secondary | ICD-10-CM | POA: Diagnosis not present

## 2023-09-10 DIAGNOSIS — M4807 Spinal stenosis, lumbosacral region: Secondary | ICD-10-CM | POA: Diagnosis not present

## 2023-09-10 DIAGNOSIS — D649 Anemia, unspecified: Secondary | ICD-10-CM | POA: Diagnosis not present

## 2023-09-10 DIAGNOSIS — F339 Major depressive disorder, recurrent, unspecified: Secondary | ICD-10-CM | POA: Diagnosis not present

## 2023-09-14 DIAGNOSIS — J3081 Allergic rhinitis due to animal (cat) (dog) hair and dander: Secondary | ICD-10-CM | POA: Diagnosis not present

## 2023-09-14 DIAGNOSIS — J301 Allergic rhinitis due to pollen: Secondary | ICD-10-CM | POA: Diagnosis not present

## 2023-09-14 DIAGNOSIS — J3089 Other allergic rhinitis: Secondary | ICD-10-CM | POA: Diagnosis not present

## 2023-09-15 ENCOUNTER — Telehealth: Payer: Self-pay | Admitting: Cardiology

## 2023-09-15 DIAGNOSIS — H2511 Age-related nuclear cataract, right eye: Secondary | ICD-10-CM | POA: Diagnosis not present

## 2023-09-15 DIAGNOSIS — H25811 Combined forms of age-related cataract, right eye: Secondary | ICD-10-CM | POA: Diagnosis not present

## 2023-09-15 DIAGNOSIS — H25011 Cortical age-related cataract, right eye: Secondary | ICD-10-CM | POA: Diagnosis not present

## 2023-09-15 DIAGNOSIS — Z961 Presence of intraocular lens: Secondary | ICD-10-CM | POA: Diagnosis not present

## 2023-09-15 NOTE — Telephone Encounter (Signed)
Spoke with [name] and she is aware provider states per patient's chart her heart rate has been low

## 2023-09-15 NOTE — Telephone Encounter (Signed)
STAT if HR is under 50 or over 120 (normal HR is 60-100 beats per minute)  What is your heart rate? Pre surgical it was 36, now it is 55  Do you have a log of your heart rate readings (document readings)?   Do you have any other symptoms?

## 2023-09-15 NOTE — Telephone Encounter (Signed)
Took stat call from [facility] team. Call was routed to [facility] instead of [facility].  Susie called about Pt's HR being 36 before surgery. Pt was given 0.4 of a medication (still unclear on medication given) and HR increased to 55. Susie just wants to know if a low HR is normal for this pt. Gave Susie HR ratings for past few office visits but told her I would contact [facility] and Dr Jens Som for follow up.  Susie's number is [phone].

## 2023-09-22 DIAGNOSIS — J3089 Other allergic rhinitis: Secondary | ICD-10-CM | POA: Diagnosis not present

## 2023-09-22 DIAGNOSIS — J301 Allergic rhinitis due to pollen: Secondary | ICD-10-CM | POA: Diagnosis not present

## 2023-09-22 DIAGNOSIS — J3081 Allergic rhinitis due to animal (cat) (dog) hair and dander: Secondary | ICD-10-CM | POA: Diagnosis not present

## 2023-09-28 DIAGNOSIS — J301 Allergic rhinitis due to pollen: Secondary | ICD-10-CM | POA: Diagnosis not present

## 2023-09-28 DIAGNOSIS — J3081 Allergic rhinitis due to animal (cat) (dog) hair and dander: Secondary | ICD-10-CM | POA: Diagnosis not present

## 2023-09-28 DIAGNOSIS — J3089 Other allergic rhinitis: Secondary | ICD-10-CM | POA: Diagnosis not present

## 2023-10-02 ENCOUNTER — Other Ambulatory Visit: Payer: Self-pay | Admitting: Family

## 2023-10-06 ENCOUNTER — Encounter: Payer: Self-pay | Admitting: Gastroenterology

## 2023-10-06 DIAGNOSIS — Z96652 Presence of left artificial knee joint: Secondary | ICD-10-CM | POA: Diagnosis not present

## 2023-10-06 DIAGNOSIS — M7052 Other bursitis of knee, left knee: Secondary | ICD-10-CM | POA: Diagnosis not present

## 2023-10-06 DIAGNOSIS — M7051 Other bursitis of knee, right knee: Secondary | ICD-10-CM | POA: Diagnosis not present

## 2023-10-15 DIAGNOSIS — R944 Abnormal results of kidney function studies: Secondary | ICD-10-CM | POA: Diagnosis not present

## 2023-10-19 DIAGNOSIS — J301 Allergic rhinitis due to pollen: Secondary | ICD-10-CM | POA: Diagnosis not present

## 2023-10-19 DIAGNOSIS — J3081 Allergic rhinitis due to animal (cat) (dog) hair and dander: Secondary | ICD-10-CM | POA: Diagnosis not present

## 2023-10-19 DIAGNOSIS — J3089 Other allergic rhinitis: Secondary | ICD-10-CM | POA: Diagnosis not present

## 2023-10-20 DIAGNOSIS — M25562 Pain in left knee: Secondary | ICD-10-CM | POA: Diagnosis not present

## 2023-10-26 DIAGNOSIS — M5416 Radiculopathy, lumbar region: Secondary | ICD-10-CM | POA: Diagnosis not present

## 2023-10-26 DIAGNOSIS — M51362 Other intervertebral disc degeneration, lumbar region with discogenic back pain and lower extremity pain: Secondary | ICD-10-CM | POA: Diagnosis not present

## 2023-10-26 DIAGNOSIS — M48062 Spinal stenosis, lumbar region with neurogenic claudication: Secondary | ICD-10-CM | POA: Diagnosis not present

## 2023-11-03 DIAGNOSIS — H25812 Combined forms of age-related cataract, left eye: Secondary | ICD-10-CM | POA: Diagnosis not present

## 2023-11-03 DIAGNOSIS — H25012 Cortical age-related cataract, left eye: Secondary | ICD-10-CM | POA: Diagnosis not present

## 2023-11-03 DIAGNOSIS — H2512 Age-related nuclear cataract, left eye: Secondary | ICD-10-CM | POA: Diagnosis not present

## 2023-11-04 DIAGNOSIS — J3081 Allergic rhinitis due to animal (cat) (dog) hair and dander: Secondary | ICD-10-CM | POA: Diagnosis not present

## 2023-11-04 DIAGNOSIS — J301 Allergic rhinitis due to pollen: Secondary | ICD-10-CM | POA: Diagnosis not present

## 2023-11-04 DIAGNOSIS — J3089 Other allergic rhinitis: Secondary | ICD-10-CM | POA: Diagnosis not present

## 2023-11-06 ENCOUNTER — Telehealth: Payer: Self-pay | Admitting: Cardiology

## 2023-11-06 DIAGNOSIS — M545 Low back pain, unspecified: Secondary | ICD-10-CM | POA: Diagnosis not present

## 2023-11-06 DIAGNOSIS — R600 Localized edema: Secondary | ICD-10-CM | POA: Diagnosis not present

## 2023-11-06 NOTE — Telephone Encounter (Signed)
Patient stated her PCP Dr. Atha Starks recommended she cancel her therapy due to patient having swelling and inflammation.  Patient has appointment scheduled on 12/17.

## 2023-11-06 NOTE — Telephone Encounter (Signed)
Left message for pt to call.

## 2023-11-09 ENCOUNTER — Ambulatory Visit: Payer: Medicare PPO | Attending: Physician Assistant | Admitting: Physician Assistant

## 2023-11-09 ENCOUNTER — Encounter: Payer: Self-pay | Admitting: Physician Assistant

## 2023-11-09 VITALS — BP 122/80 | HR 51 | Ht 64.0 in | Wt 211.0 lb

## 2023-11-09 DIAGNOSIS — R001 Bradycardia, unspecified: Secondary | ICD-10-CM

## 2023-11-09 DIAGNOSIS — I2510 Atherosclerotic heart disease of native coronary artery without angina pectoris: Secondary | ICD-10-CM | POA: Diagnosis not present

## 2023-11-09 DIAGNOSIS — I10 Essential (primary) hypertension: Secondary | ICD-10-CM | POA: Diagnosis not present

## 2023-11-09 DIAGNOSIS — R0609 Other forms of dyspnea: Secondary | ICD-10-CM

## 2023-11-09 DIAGNOSIS — M7989 Other specified soft tissue disorders: Secondary | ICD-10-CM

## 2023-11-09 DIAGNOSIS — R0601 Orthopnea: Secondary | ICD-10-CM | POA: Diagnosis not present

## 2023-11-09 DIAGNOSIS — E785 Hyperlipidemia, unspecified: Secondary | ICD-10-CM

## 2023-11-09 MED ORDER — TORSEMIDE 40 MG PO TABS: ORAL_TABLET | ORAL | 6 refills | Status: DC

## 2023-11-09 NOTE — Progress Notes (Signed)
Cardiology Office Note:    Date:  11/09/2023   ID:  [name], DOB 09/17/1941, MRN [id number]  PCP:  [facility]   [facility] Cardiologist:  Olga Millers, MD     Referring MD: [facility]   Chief Complaint  Patient presents with   Follow-up   Edema    Legs.   Chest Pain    History of Present Illness:    [name] is a 82 y.o. female with a hx of CAD, HLD, dementia, COPD, and DJD.  She had a heart catheterization 01/2004 showed 40% proximal LAD, 30% distal LAD lesion, 30-40% first diagonal proximal lesion, ostial LAD 60% and nonobstructive circumflex and RCA disease.  She had normal LV function.  Nuclear stress test 12/2014 with an LVEF 58% and normal perfusion.  Echocardiogram 03/2018 showed normal LVEF, mild LVH, G1 DD, mild right atrial and right ventricular enlargement, mild TR.  She ruled out for DVT by Dopplers 10/2020.  She has a history of increasing bilateral lower extremity swelling with increased fluid intake.  In March 2024 she underwent short course of increased Lasix for lower extremity swelling.  She was last seen by Dr. Jens Som in clinic 08/11/2023 and was doing well at that time.  She is placed on my schedule today for bilateral lower extremity swelling.  She reports swelling in her legs for about 1-2 weeks. She reports mild shortness of breath with exertion. DOE is limiting her activity. She also reports orthopnea, but also states this has been her baseline. She reports intermittent chest pain, but this is not new for her.   She is currently taking 80 mg lasix daily. PCP on Friday also stated her HR was bradycardic. She denies syncope. BNP 23.8. Bilateral leg swelling.    Past Medical History:  Diagnosis Date   Allergic rhinitis    Arnold-Chiari malformation (HCC)    Asthma    CAD (coronary artery disease)    COPD (chronic obstructive pulmonary disease) (HCC)    Coughing    coughing since last  01-22-2019 , started on cefdinir bid x10 days, has 3 pills left today , reports she feels mcuh better , still coughing copiuus amonts of thick white sputum , denies fever nor chills, nor body aches .    DJD (degenerative joint disease)    GERD (gastroesophageal reflux disease)    Hiatal hernia    History of absence seizures    last confirmed seizure age 80     Hx of colonic polyps    Hypercholesteremia    Hypertension    Obesity    Positive PPD    Reflex sympathetic dystrophy    Sleep apnea    Varicose veins    Venous insufficiency     Past Surgical History:  Procedure Laterality Date   ABDOMINAL HYSTERECTOMY     CHOLECYSTECTOMY N/A 02/19/2023   Procedure: LAPAROSCOPIC CHOLECYSTECTOMY WITH INTRAOPERATIVE CHOLANGIOGRAM AND ICG DYE;  Surgeon: Gaynelle Adu, MD;  Location: [facility] OR;  Service: General;  Laterality: N/A;   cspine surgery  1993   for arnold-chiari malformation   IR BONE MARROW BIOPSY & ASPIRATION  01/28/2023   JOINT REPLACEMENT  06/05/11   Left total knee replacement   right knee arthroscopy  12/2007   Dr. Shelle Iron   right total knee replacement  05/2008   Dr. Shelle Iron   TOTAL KNEE REVISION Left 02/03/2019   Procedure: LEFT TOTAL KNEE REVISION;  Surgeon: Samson Frederic, MD;  Location:  [facility] ORS;  Service: Orthopedics;  Laterality: Left;    Current Medications: No outpatient medications have been marked as taking for the 11/09/23 encounter (Office Visit) with Marcelino Duster, PA.     Allergies:   Patient has no known allergies.   Social History   Socioeconomic History   Marital status: Widowed    Spouse name: Not on file   Number of children: 2   Years of education: 22   Highest education level: Not on file  Occupational History   Occupation: retired  Tobacco Use   Smoking status: Former    Current packs/day: 0.00    Average packs/day: 0.3 packs/day for 12.0 years (3.6 ttl pk-yrs)    Types: Cigarettes    Start date: 01/02/2000    Quit date: 01/02/2012    Years since  quitting: 11.8   Smokeless tobacco: Never   Tobacco comments:    1 pack per week  Vaping Use   Vaping status: Never Used  Substance and Sexual Activity   Alcohol use: No   Drug use: No   Sexual activity: Not on file  Other Topics Concern   Not on file  Social History Narrative   Fun/Hobby: Playing cards and mingling.   Denies abuse and feels safe at home.    Social Determinants of Health   Financial Resource Strain: Not on file  Food Insecurity: Not on file  Transportation Needs: Not on file  Physical Activity: Not on file  Stress: Not on file  Social Connections: Not on file     Family History: The patient's family history includes Clotting disorder in her father; Heart disease in her mother; Stroke in her father and mother. There is no history of Colon cancer or Stomach cancer.  ROS:   Please see the history of present illness.     All other systems reviewed and are negative.  EKGs/Labs/Other Studies Reviewed:    The following studies were reviewed today:  EKG Interpretation Date/Time:  Monday November 09 2023 13:37:40 EST Ventricular Rate:  51 PR Interval:  144 QRS Duration:  80 QT Interval:  424 QTC Calculation: 390 R Axis:   -19  Text Interpretation: Sinus bradycardia Moderate voltage criteria for LVH, may be normal variant ( R in aVL , Cornell product ) Anterior infarct , age undetermined When compared with ECG of 04-Feb-2023 09:41, No significant change was found Confirmed by Micah Flesher ([id number]) on 11/09/2023 1:41:49 PM    Recent Labs: 12/20/2022: ALT 7; TSH 2.113 02/04/2023: Hemoglobin 11.7; Platelets 165 08/18/2023: BUN 22; Creatinine, Ser 1.04; Potassium 3.9; Sodium 141  Recent Lipid Panel    Component Value Date/Time   CHOL 151 06/18/2022 1143   CHOL 144 11/07/2019 1605   TRIG 74.0 06/18/2022 1143   HDL 58.30 06/18/2022 1143   HDL 68 11/07/2019 1605   CHOLHDL 3 06/18/2022 1143   VLDL 14.8 06/18/2022 1143   LDLCALC 78 06/18/2022 1143   LDLCALC 57  11/07/2019 1605     Risk Assessment/Calculations:                Physical Exam:    VS:  BP 122/80 (BP Location: Left Arm, Patient Position: Sitting, Cuff Size: Normal)   Pulse (!) 51   Ht 5\' 4"  (1.626 m)   Wt 211 lb (95.7 kg)   BMI 36.22 kg/m     Wt Readings from Last 3 Encounters:  11/09/23 211 lb (95.7 kg)  08/11/23 220 lb (99.8 kg)  02/19/23 216 lb (98  kg)     GEN:  Well nourished, well developed in no acute distress HEENT: Normal NECK: No JVD; No carotid bruits LYMPHATICS: No lymphadenopathy CARDIAC: RRR, no murmurs, rubs, gallops RESPIRATORY:  Clear to auscultation without rales, wheezing or rhonchi  ABDOMEN: Soft, non-tender, non-distended MUSCULOSKELETAL:  B LE edema, 2+ pitting edema to thighs SKIN: Warm and dry NEUROLOGIC:  Alert and oriented x 3 PSYCHIATRIC:  Normal affect   ASSESSMENT:    1. Coronary artery disease involving native heart, unspecified vessel or lesion type, unspecified whether angina present   2. Essential hypertension   3. Swelling of lower extremity   4. DOE (dyspnea on exertion)   5. Orthopnea   6. Bradycardia   7. Atherosclerosis of native coronary artery of native heart without angina pectoris   8. Hyperlipidemia with target LDL less than 70   9. Primary hypertension    PLAN:    In order of problems listed above:  Lower extremity swelling HFpEF -- also reports PND, orthopnea and worsening DOE, although she is very sedentary due to arthritis - 80 mg Lasix daily - this has been her dose for greater than 1 year - along with 40 mEq potassium -- interestingly BNP was 23 on Friday -- switch lasix to 40 mg torsemide, keep K supplement -- will have close follow up next week with BMP -- gave ER precautions - but may ultimately need IV medication -- will see how she responds to torsemide, may need to repeat an echo, will hold off for now - interestingly, lungs clear, no JVD   Bradycardia - HR in the 50s, this is chronic - no  syncope - avoid AV nodal agents   CAD -- Nonobstructive disease by heart catheterization 2005 - Reassuring nuclear stress test 12/2014 - Risk factor modification, continue aspirin -- stable chest pain   Hyperlipidemia with LDL goal less than 70 Continue Crestor, aspirin   Hypertension - BP controlled today   Follow up next week with BMP      Medication Adjustments/Labs and Tests Ordered: Current medicines are reviewed at length with the patient today.  Concerns regarding medicines are outlined above.  Orders Placed This Encounter  Procedures   EKG 12-Lead   No orders of the defined types were placed in this encounter.   Patient Instructions  Medication Instructions:  Stop Furosemide Start Torsemide 40 mg daily Continue all other medications *If you need a refill on your cardiac medications before your next appointment, please call your pharmacy*   Lab Work: None ordered   Testing/Procedures: None ordered   Follow-Up: At [facility], you and your health needs are our priority.  As part of our continuing mission to provide you with exceptional heart care, we have created designated Provider Care Teams.  These Care Teams include your primary Cardiologist (physician) and Advanced Practice Providers (APPs -  Physician Assistants and Nurse Practitioners) who all work together to provide you with the care you need, when you need it.  We recommend signing up for the patient portal called "MyChart".  Sign up information is provided on this After Visit Summary.  MyChart is used to connect with patients for Virtual Visits (Telemedicine).  Patients are able to view lab/test results, encounter notes, upcoming appointments, etc.  Non-urgent messages can be sent to your provider as well.   To learn more about what you can do with MyChart, go to [email or web address].    Your next appointment:  Friday 11/20/23 at 3:35 pm  Provider:  Azalee Course PA      Signed, Roe Rutherford [place], [place]  11/09/2023 2:09 PM    [facility]

## 2023-11-09 NOTE — Patient Instructions (Signed)
Medication Instructions:  Stop Furosemide Start Torsemide 40 mg daily Continue all other medications *If you need a refill on your cardiac medications before your next appointment, please call your pharmacy*   Lab Work: None ordered   Testing/Procedures: None ordered   Follow-Up: At [facility], you and your health needs are our priority.  As part of our continuing mission to provide you with exceptional heart care, we have created designated Provider Care Teams.  These Care Teams include your primary Cardiologist (physician) and Advanced Practice Providers (APPs -  Physician Assistants and Nurse Practitioners) who all work together to provide you with the care you need, when you need it.  We recommend signing up for the patient portal called "MyChart".  Sign up information is provided on this After Visit Summary.  MyChart is used to connect with patients for Virtual Visits (Telemedicine).  Patients are able to view lab/test results, encounter notes, upcoming appointments, etc.  Non-urgent messages can be sent to your provider as well.   To learn more about what you can do with MyChart, go to [email or web address].    Your next appointment:  Friday 11/20/23 at 3:35 pm    Provider:  Azalee Course PA

## 2023-11-10 NOTE — Telephone Encounter (Signed)
Patient identification verified by 2 forms. Marilynn Rail, RN    Called and spoke to patient  Patient states:   -she had OV yesterday   -all questions/concerns addressed at 12/9 appointment  Patient has no further questions at this time  Advised patient to outreach with any concerns

## 2023-11-11 ENCOUNTER — Other Ambulatory Visit: Payer: Self-pay

## 2023-11-11 ENCOUNTER — Emergency Department (HOSPITAL_COMMUNITY): Payer: Medicare PPO

## 2023-11-11 ENCOUNTER — Encounter (HOSPITAL_COMMUNITY): Payer: Self-pay

## 2023-11-11 ENCOUNTER — Emergency Department (HOSPITAL_COMMUNITY)
Admission: EM | Admit: 2023-11-11 | Discharge: 2023-11-12 | Disposition: A | Discharge status: Home / Self Care | Payer: Medicare PPO | Attending: Emergency Medicine | Admitting: Emergency Medicine

## 2023-11-11 DIAGNOSIS — R0600 Dyspnea, unspecified: Secondary | ICD-10-CM | POA: Insufficient documentation

## 2023-11-11 DIAGNOSIS — G8929 Other chronic pain: Secondary | ICD-10-CM | POA: Insufficient documentation

## 2023-11-11 DIAGNOSIS — I517 Cardiomegaly: Secondary | ICD-10-CM | POA: Diagnosis not present

## 2023-11-11 DIAGNOSIS — R0789 Other chest pain: Secondary | ICD-10-CM | POA: Diagnosis not present

## 2023-11-11 DIAGNOSIS — R079 Chest pain, unspecified: Secondary | ICD-10-CM | POA: Diagnosis not present

## 2023-11-11 DIAGNOSIS — Z7982 Long term (current) use of aspirin: Secondary | ICD-10-CM | POA: Diagnosis not present

## 2023-11-11 LAB — CBC
HCT: 41.4 % (ref 36.0–46.0)
Hemoglobin: 12.5 g/dL (ref 12.0–15.0)
MCH: 26.9 pg (ref 26.0–34.0)
MCHC: 30.2 g/dL (ref 30.0–36.0)
MCV: 89 fL (ref 80.0–100.0)
Platelets: 192 10*3/uL (ref 150–400)
RBC: 4.65 MIL/uL (ref 3.87–5.11)
RDW: 14.5 % (ref 11.5–15.5)
WBC: 3.9 10*3/uL — ABNORMAL LOW (ref 4.0–10.5)
nRBC: 0 % (ref 0.0–0.2)

## 2023-11-11 LAB — BASIC METABOLIC PANEL
Anion gap: 12 (ref 5–15)
BUN: 20 mg/dL (ref 8–23)
CO2: 29 mmol/L (ref 22–32)
Calcium: 9.7 mg/dL (ref 8.9–10.3)
Chloride: 95 mmol/L — ABNORMAL LOW (ref 98–111)
Creatinine, Ser: 1.44 mg/dL — ABNORMAL HIGH (ref 0.44–1.00)
GFR, Estimated: 37 mL/min — ABNORMAL LOW (ref >60)
Glucose, Bld: 121 mg/dL — ABNORMAL HIGH (ref 70–99)
Potassium: 3.9 mmol/L (ref 3.5–5.1)
Sodium: 136 mmol/L (ref 135–145)

## 2023-11-11 LAB — TROPONIN I (HIGH SENSITIVITY): Troponin I (High Sensitivity): 10 ng/L (ref <18)

## 2023-11-11 NOTE — ED Triage Notes (Signed)
Chest pain that started today, radiates to both arms. Pt states she saw PCP yesterday and they changed her medication, pt was changed from furosemide to torsemide. Intermittent SOB.

## 2023-11-12 DIAGNOSIS — R0789 Other chest pain: Secondary | ICD-10-CM | POA: Diagnosis not present

## 2023-11-12 LAB — TROPONIN I (HIGH SENSITIVITY): Troponin I (High Sensitivity): 10 ng/L (ref <18)

## 2023-11-12 LAB — MAGNESIUM: Magnesium: 2.4 mg/dL (ref 1.7–2.4)

## 2023-11-12 LAB — BRAIN NATRIURETIC PEPTIDE: B Natriuretic Peptide: 20.2 pg/mL (ref 0.0–100.0)

## 2023-11-12 NOTE — ED Provider Notes (Signed)
[place] EMERGENCY DEPARTMENT AT [facility] Provider Note   CSN: [id number] Arrival date & time: 11/11/23  2054     History  Chief Complaint  Patient presents with   Chest Pain    [name] is a 82 y.o. female.  Patient presents to the emergency department for evaluation of chest pain.  Patient reports that she has been experiencing pain across her chest that does travel down both of her arms.  She has been having some shaking of her upper extremities.  Sometimes the pain shoots down into her legs as well.  Patient reports that her doctors have recently been changing her medications because of fluid.  She had previously been on Lasix, this was changed to torsemide yesterday by her cardiology office because of increased swelling of her legs.  Patient has had chronic dyspnea on exertion with some orthopnea which prompted the change.       Home Medications Prior to Admission medications   Medication Sig Start Date End Date Taking? Authorizing Provider  acetaminophen (TYLENOL) 500 MG tablet Take 1,000 mg by mouth every 6 (six) hours as needed for moderate pain.    [provider]  albuterol (PROVENTIL HFA;VENTOLIN HFA) 108 (90 BASE) MCG/ACT inhaler Inhale 2 puffs into the lungs every 4 (four) hours as needed. For wheeze or shortness of breath 03/26/12   Michele Mcalpine, MD  alendronate (FOSAMAX) 70 MG tablet TAKE ONE TABLET BY MOUTH ONCE WEEKLY ON [facility] 02/10/23   Corwin Levins, MD  aspirin 81 MG tablet Take 81 mg by mouth daily.    [provider]  azelastine (ASTELIN) 0.1 % nasal spray Place 1 spray into both nostrils 2 (two) times daily as needed for rhinitis or allergies. 11/07/18   [provider]  budesonide-formoterol (SYMBICORT) 80-4.5 MCG/ACT inhaler Inhale 2 puffs into the lungs 2 (two) times daily as needed (shortness of breath).    [provider]  citalopram (CELEXA) 10 MG tablet Take 1 tablet (10 mg total) by mouth  daily. Patient taking differently: Take 20 mg by mouth daily. 05/07/21 02/06/23  Corwin Levins, MD  [employer] D3 50 MCG (2000 UT) CAPS Take 1 capsule by mouth daily. 01/09/21   [provider]  donepezil (ARICEPT ODT) 5 MG disintegrating tablet Take 1 tablet (5 mg total) by mouth at bedtime. 05/07/21   Corwin Levins, MD  donepezil (ARICEPT) 5 MG tablet Take 5 mg by mouth at bedtime.    [provider]  EPINEPHrine 0.3 mg/0.3 mL IJ SOAJ injection Inject 0.3 mg into the muscle as needed for anaphylaxis.    [provider]  famotidine (PEPCID) 20 MG tablet TAKE ONE TABLET BY MOUTH TWICE DAILY 04/10/23   Corwin Levins, MD  ferrous sulfate 325 (65 FE) MG EC tablet Take 325 mg by mouth daily.    [provider]  fluticasone (FLONASE) 50 MCG/ACT nasal spray Place 1 spray into both nostrils 2 (two) times daily as needed for allergies or rhinitis.    [provider]  lidocaine (LIDODERM) 5 % Place 1 patch onto the skin daily. Remove & Discard patch within 12 hours or as directed by MD Patient taking differently: Place 1 patch onto the skin daily as needed (pain). Remove & Discard patch within 12 hours or as directed by MD 10/30/22   Linwood Dibbles, MD  loperamide (IMODIUM A-D) 2 MG tablet Take 2 mg by mouth as needed for diarrhea or loose stools.  [provider]  meloxicam (MOBIC) 15 MG tablet TAKE ONE TABLET BY MOUTH ONCE daily AS NEEDED FOR PAIN 02/10/23   Corwin Levins, MD  montelukast (SINGULAIR) 10 MG tablet TAKE 1 TABLET BY MOUTH EVERY DAY IN THE EVENING 11/27/22   Corwin Levins, MD  mupirocin cream (BACTROBAN) 2 % Apply 1 Application topically 2 (two) times daily. 07/04/23   Eleonore Chiquito, FNP  pantoprazole (PROTONIX) 40 MG tablet TAKE 1 TABLET BY MOUTH EVERY DAY 05/20/22   Corwin Levins, MD  potassium chloride SA (KLOR-CON M20) 20 MEQ tablet Take 1 tablet (20 mEq total) by mouth 2 (two) times daily. 08/11/23   Lewayne Bunting, MD  predniSONE (DELTASONE) 20 MG  tablet 2 tablets Orally Once a day for 5 days Patient not taking: Reported on 08/11/2023 07/01/23   [provider]  rosuvastatin (CRESTOR) 40 MG tablet Take 1 tablet (40 mg total) by mouth daily. 05/07/21 02/02/23  Corwin Levins, MD  Tiotropium Bromide Monohydrate (SPIRIVA RESPIMAT) 1.25 MCG/ACT AERS Inhale 1.25 mcg into the lungs daily.    [provider]  tiZANidine (ZANAFLEX) 2 MG tablet Take 1 tablet (2 mg total) by mouth every 6 (six) hours as needed for muscle spasms. 06/07/21   Corwin Levins, MD  Torsemide 40 MG TABS Take 40 mg every morning 11/09/23   Marcelino Duster, PA  traZODone (DESYREL) 100 MG tablet TAKE ONE TABLET BY MOUTH EVERYDAY AT BEDTIME 04/10/23   Corwin Levins, MD      Allergies    Patient has no known allergies.    Review of Systems   Review of Systems  Physical Exam Updated Vital Signs BP 124/73   Pulse (!) 54   Temp 99.4 F (37.4 C)   Resp 17   Ht 5\' 4"  (1.626 m)   Wt 92.1 kg   SpO2 99%   BMI 34.84 kg/m  Physical Exam Vitals and nursing note reviewed.  Constitutional:      General: She is not in acute distress.    Appearance: She is well-developed.  HENT:     Head: Normocephalic and atraumatic.     Mouth/Throat:     Mouth: Mucous membranes are moist.  Eyes:     General: Vision grossly intact. Gaze aligned appropriately.     Extraocular Movements: Extraocular movements intact.     Conjunctiva/sclera: Conjunctivae normal.  Cardiovascular:     Rate and Rhythm: Normal rate and regular rhythm.     Pulses: Normal pulses.     Heart sounds: Normal heart sounds, S1 normal and S2 normal. No murmur heard.    No friction rub. No gallop.  Pulmonary:     Effort: Pulmonary effort is normal. No respiratory distress.     Breath sounds: Normal breath sounds.  Abdominal:     General: Bowel sounds are normal.     Palpations: Abdomen is soft.     Tenderness: There is no abdominal tenderness. There is no guarding or rebound.     Hernia: No hernia is  present.  Musculoskeletal:        General: No swelling.     Cervical back: Full passive range of motion without pain, normal range of motion and neck supple. No spinous process tenderness or muscular tenderness. Normal range of motion.     Right lower leg: Edema present.     Left lower leg: Edema present.  Skin:    General: Skin is warm and dry.  Capillary Refill: Capillary refill takes less than 2 seconds.     Findings: No ecchymosis, erythema, rash or wound.  Neurological:     General: No focal deficit present.     Mental Status: She is alert and oriented to person, place, and time.     GCS: GCS eye subscore is 4. GCS verbal subscore is 5. GCS motor subscore is 6.     Cranial Nerves: Cranial nerves 2-12 are intact.     Sensory: Sensation is intact.     Motor: Motor function is intact.     Coordination: Coordination is intact.  Psychiatric:        Attention and Perception: Attention normal.        Mood and Affect: Mood normal.        Speech: Speech normal.        Behavior: Behavior normal.     ED Results / Procedures / Treatments   Labs (all labs ordered are listed, but only abnormal results are displayed) Labs Reviewed  BASIC METABOLIC PANEL - Abnormal; Notable for the following components:      Result Value   Chloride 95 (*)    Glucose, Bld 121 (*)    Creatinine, Ser 1.44 (*)    GFR, Estimated 37 (*)    All other components within normal limits  CBC - Abnormal; Notable for the following components:   WBC 3.9 (*)    All other components within normal limits  BRAIN NATRIURETIC PEPTIDE  MAGNESIUM  TROPONIN I (HIGH SENSITIVITY)  TROPONIN I (HIGH SENSITIVITY)    EKG EKG Interpretation Date/Time:  Wednesday November 11 2023 21:24:07 EST Ventricular Rate:  56 PR Interval:  149 QRS Duration:  84 QT Interval:  418 QTC Calculation: 404 R Axis:   -26  Text Interpretation: Sinus rhythm Left atrial enlargement Left ventricular hypertrophy Confirmed by Gilda Crease [phone]) on 11/12/2023 12:02:16 AM  Radiology DG Chest 2 View Result Date: 11/11/2023 CLINICAL DATA:  Chest pain EXAM: CHEST - 2 VIEW COMPARISON:  10/30/2022 FINDINGS: Lungs are clear.  No pleural effusion or pneumothorax. Mild cardiomegaly. Mild degenerative changes of the visualized thoracolumbar spine. IMPRESSION: Mild cardiomegaly. No acute cardiopulmonary disease. Electronically Signed   By: Charline Bills M.D.   On: 11/11/2023 21:42    Procedures Procedures    Medications Ordered in ED Medications - No data to display  ED Course/ Medical Decision Making/ A&P                                 Medical Decision Making Amount and/or Complexity of Data Reviewed Labs: ordered. Radiology: ordered.   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  Reviewing her records reveals that this is chronic, all previous EKGs have a heart rate in the 40s and 50s.  No intervention necessary for her bradycardia.  Patient has had a previous catheterization (01/2004) with nonobstructive heart disease.  Symptoms were witnessed here.  She will have an onset of pain while at rest that lasts for some minutes and then resolves.  This does not seem cardiac in nature.  Lab work seems to suggest that she is slightly dry.  She was changed from Lasix to torsemide.  Chest x-ray is clear.  BNP is 20.  This seems to suggest that her leg swelling is secondary  to venous stasis and not congestive heart failure.  She may need to be back off on her diuretics.  She is instructed to follow-up with her doctor in 1 week for recheck of her electrolytes and kidney function.  Her cardiac evaluation today was reassuring, does not require hospitalization.        Final Clinical Impression(s) / ED Diagnoses Final  diagnoses:  Atypical chest pain    Rx / DC Orders ED Discharge Orders     None         Akira Perusse, Canary Brim, MD 11/12/23 [phone]

## 2023-11-12 NOTE — Discharge Instructions (Addendum)
Please schedule follow-up with your primary doctor for next week to have blood drawn to recheck your kidney function and electrolytes.  This is to see how they change with the new diuretic.

## 2023-11-17 ENCOUNTER — Ambulatory Visit: Payer: Medicare PPO | Admitting: Cardiology

## 2023-11-17 DIAGNOSIS — M5416 Radiculopathy, lumbar region: Secondary | ICD-10-CM | POA: Diagnosis not present

## 2023-11-20 ENCOUNTER — Ambulatory Visit: Payer: Medicare PPO | Attending: Physician Assistant | Admitting: Emergency Medicine

## 2023-11-20 ENCOUNTER — Encounter: Payer: Self-pay | Admitting: Physician Assistant

## 2023-11-20 VITALS — BP 128/76 | HR 66 | Ht 59.0 in | Wt 208.0 lb

## 2023-11-20 DIAGNOSIS — M7989 Other specified soft tissue disorders: Secondary | ICD-10-CM | POA: Diagnosis not present

## 2023-11-20 DIAGNOSIS — I10 Essential (primary) hypertension: Secondary | ICD-10-CM | POA: Diagnosis not present

## 2023-11-20 DIAGNOSIS — I2510 Atherosclerotic heart disease of native coronary artery without angina pectoris: Secondary | ICD-10-CM

## 2023-11-20 DIAGNOSIS — I739 Peripheral vascular disease, unspecified: Secondary | ICD-10-CM | POA: Diagnosis not present

## 2023-11-20 DIAGNOSIS — R0602 Shortness of breath: Secondary | ICD-10-CM | POA: Diagnosis not present

## 2023-11-20 DIAGNOSIS — E785 Hyperlipidemia, unspecified: Secondary | ICD-10-CM | POA: Diagnosis not present

## 2023-11-20 DIAGNOSIS — I5032 Chronic diastolic (congestive) heart failure: Secondary | ICD-10-CM

## 2023-11-20 NOTE — Progress Notes (Signed)
Cardiology Office Note:    Date:  11/20/2023  ID:  [name], DOB 1941-11-04, MRN [id number] PCP: Aliene Beams, MD  [facility] Providers Cardiologist:  Olga Millers, MD       Patient Profile:      [name] is a 82 year old female with visit pertinent history of CAD, HLD, dementia, COPD, DJD.  She had a heart catheterization in 2005 showing 40% proximal LAD, 30% distal LAD lesion, 30-40% first diagonal proximal lesion, ostial LAD 60% and nonobstructive circumflex and RCA disease.  Nuclear stress test in 2016 with an LVEF 58% and normal perfusion.  Echocardiogram April 2019 showed normal LVEF, mild LVH, grade 1 DD, mild right atrial and right ventricular enlargement, mild TR.  She was ruled out for DVT by Dopplers November 2021.  She has history of increasing bilateral lower extremity swelling with increased fluid intake.  In March 2024 she underwent short course of increased Lasix for lower extremity swelling.  Last seen in clinic by Micah Flesher, PA on 11/09/2023 for bilateral lower extremity swelling.  She noted PND, orthopnea, worsening DOE that was limiting her activity.  Her 80 mg Lasix was switched to 40 mg torsemide.  She was most recently seen in the ED on 11/11/2023 for evaluation of chest pain.  She had a normal chest x-ray and a BNP of 20.  High sensitive troponin x2 was 10.  It was suggested that her leg swelling is secondary to venous stasis and not CHF.  Lab work does show possible dehydration given elevated creatinine of 1.44 and GFR 37.      History of Present Illness:  Discussed the use of AI scribe software for clinical note transcription with the patient, who gave verbal consent to proceed.  [name] is a 82 y.o. female who returns for lower extremity swelling.  Today patient notes that she is doing well.  She tells me that her lower extremity swelling has in fact improved since the addition of torsemide.  She tells me that her shortness of breath,  DOE, orthopnea have also improved.  She does note that she does have chronic shortness of breath and she feels that she is back at her baseline currently.  She notes that she drinks at least  four 16 ounces of bottled water daily with sweet tea and also soft drinks.  She notes that it was mainly the shortness of breath that had brought her into the emergency room last week however she feels much better.  She does note bilateral lower extremity pain that radiates from both calves up her legs that worsens when she walks and typically improves at rest.  She is not having any chest pain or exertional angina at this time.  She denies any decrease in urination.   She denies chest pain, fatigue, palpitations, melena, hematuria, hemoptysis, diaphoresis, weakness, presyncope, syncope, and PND.       Review of Systems  Constitutional: Negative for weight gain and weight loss.  Cardiovascular:  Positive for claudication, dyspnea on exertion, leg swelling and orthopnea. Negative for chest pain, irregular heartbeat, near-syncope, palpitations, paroxysmal nocturnal dyspnea and syncope.  Respiratory:  Positive for shortness of breath. Negative for cough and hemoptysis.   Gastrointestinal:  Negative for abdominal pain, hematochezia and melena.  Genitourinary:  Negative for hematuria.     See HPI    Studies Reviewed:       Echocardiogram 03/26/2018 Study Conclusions  - Left ventricle: The cavity size was normal. Wall  thickness was    increased in a pattern of mild LVH. Systolic function was normal.    The estimated ejection fraction was in the range of 55% to 60%.    Wall motion was normal; there were no regional wall motion    abnormalities. Doppler parameters are consistent with abnormal    left ventricular relaxation (grade 1 diastolic dysfunction).  - Aortic valve: Mildly calcified annulus. Trileaflet.  - Mitral valve: Mildly thickened leaflets .  - Right ventricle: The cavity size was mildly dilated.  Wall    thickness was normal. Systolic function was low normal.  - Right atrium: The atrium was mildly dilated.  - Tricuspid valve: There was mild regurgitation.  - Pulmonary arteries: PA peak pressure: 36 mm Hg (S).   MPI 12/28/2014 Impression Exercise Capacity:  Lexiscan with no exercise. BP Response:  Normal blood pressure response. Clinical Symptoms:  No significant symptoms noted. ECG Impression:  No significant ECG changes with Lexiscan. Comparison with Prior Nuclear Study: No images to compare Risk Assessment/Calculations:             Physical Exam:   VS:  BP 128/76 (BP Location: Left Arm, Patient Position: Sitting, Cuff Size: Normal)   Pulse 66   Ht 4\' 11"  (1.499 m)   Wt 208 lb (94.3 kg)   SpO2 91%   BMI 42.01 kg/m    Wt Readings from Last 3 Encounters:  11/20/23 208 lb (94.3 kg)  11/11/23 203 lb (92.1 kg)  11/09/23 211 lb (95.7 kg)    Constitutional:      Appearance: Normal and healthy appearance.  HENT:     Head: Normocephalic.  Neck:     Vascular: JVD normal.  Pulmonary:     Effort: Pulmonary effort is normal.     Breath sounds: Normal breath sounds.  Chest:     Chest wall: Not tender to palpatation.  Cardiovascular:     PMI at left midclavicular line. Normal rate. Regular rhythm. Normal S1. Normal S2.      Murmurs: There is no murmur.     No gallop.  No click. No rub.  Pulses:    Intact distal pulses.  Edema:    Pretibial: bilateral 1+ edema of the pretibial area.    Ankle: bilateral 1+ edema of the ankle.    Feet: bilateral 1+ edema of the feet. Musculoskeletal: Normal range of motion.     Cervical back: Normal range of motion and neck supple. Skin:    General: Skin is warm and dry.  Neurological:     General: No focal deficit present.     Mental Status: Alert, oriented to person, place, and time and oriented to person, place and time.  Psychiatric:        Attention and Perception: Attention and perception normal.        Mood and Affect: Mood  normal.        Behavior: Behavior is cooperative.        Thought Content: Thought content normal.        Assessment and Plan:  HFpEF / Lower extremity swelling  -Echo 03/2018 LVEF 55 to 60% -Since addition of torsemide 40mg  last week her lower extremity swelling, SOB, orthopnea, DOE have improved.  Her BNP was 20.2.  Swelling likely in the setting of venous stasis. She is euvolemic and well compensated at this time. NYHA class II.  Due to recent exacerbation of lower extremity swelling and her ongoing shortness of breath will plan to repeat  echocardiogram given most recent was in 2019.  Of note her most recent creatinine was 1.44 which was increased from 1.04.  This BMP was taken just 2 days after the transition from Lasix to torsemide.  Plan to repeat BMP today, if creatinine is stable we will continue with current therapy.  We did advise to do home avoid dehydration and drink 64 ounces of water daily.  Continue torsemide 40 mg once daily, potassium 20 mEq 2 times daily.   Claudication -She notes pain to back of calves and hamstrings that worsens when she walks and improves during rest.  She notes the leg pain has worsened over the last 2 weeks.  Left leg greater than right leg currently.  Plan for ABIs.  CAD / Hyperlipidemia  -Nonobstructive disease by heart catheterization in 2005.  Reassuring and low risk nuclear stress test in 2016.  Currently without chest pain and chronic SOB stable at this time.  Overall, stable with no anginal symptoms, no indication for ischemic evaluation at this time.  LDL 62 on 09/10/2023 and below goal of 70.  Continue aspirin 81 mg daily, rosuvastatin 40 mg.  Bradycardia  -Current heart rate 66bpm.  No complaints of syncope.  Avoid AV nodal blocking agents.   Hypertension  -BP today 120/76 and under good control today.              Dispo:  Return in about 2 months (around 01/21/2024).  Signed, [name], NP

## 2023-11-20 NOTE — Patient Instructions (Signed)
Medication Instructions:  NO CHANGES *If you need a refill on your cardiac medications before your next appointment, please call your pharmacy*   Lab Work: BMP TODAY If you have labs (blood work) drawn today and your tests are completely normal, you will receive your results only by: MyChart Message (if you have MyChart) OR A paper copy in the mail If you have any lab test that is abnormal or we need to change your treatment, we will call you to review the results.   Testing/Procedures:[address] Your physician has requested that you have an ankle brachial index (ABI). During this test an ultrasound and blood pressure cuff are used to evaluate the arteries that supply the arms and legs with blood. Allow thirty minutes for this exam. There are no restrictions or special instructions.  Please note: We ask at that you not bring children with you during ultrasound (echo/ vascular) testing. Due to room size and safety concerns, children are not allowed in the ultrasound rooms during exams. Our front office staff cannot provide observation of children in our lobby area while testing is being conducted. An adult accompanying a patient to their appointment will only be allowed in the ultrasound room at the discretion of the ultrasound technician under special circumstances. We apologize for any inconvenience.    Follow-Up: At [facility], you and your health needs are our priority.  As part of our continuing mission to provide you with exceptional heart care, we have created designated Provider Care Teams.  These Care Teams include your primary Cardiologist (physician) and Advanced Practice Providers (APPs -  Physician Assistants and Nurse Practitioners) who all work together to provide you with the care you need, when you need it.    Your next appointment:   2 month(s)  Provider:   Azalee Course, PA

## 2023-11-21 LAB — BASIC METABOLIC PANEL
BUN/Creatinine Ratio: 16 (ref 12–28)
BUN: 17 mg/dL (ref 8–27)
CO2: 26 mmol/L (ref 20–29)
Calcium: 9.5 mg/dL (ref 8.7–10.3)
Chloride: 102 mmol/L (ref 96–106)
Creatinine, Ser: 1.08 mg/dL — ABNORMAL HIGH (ref 0.57–1.00)
Glucose: 80 mg/dL (ref 70–99)
Potassium: 4.3 mmol/L (ref 3.5–5.2)
Sodium: 143 mmol/L (ref 134–144)
eGFR: 52 mL/min/{1.73_m2} — ABNORMAL LOW (ref >59)

## 2023-12-09 ENCOUNTER — Encounter: Payer: Self-pay | Admitting: Podiatry

## 2023-12-09 ENCOUNTER — Ambulatory Visit (INDEPENDENT_AMBULATORY_CARE_PROVIDER_SITE_OTHER): Payer: Medicare HMO | Admitting: Podiatry

## 2023-12-09 DIAGNOSIS — M79675 Pain in left toe(s): Secondary | ICD-10-CM | POA: Diagnosis not present

## 2023-12-09 DIAGNOSIS — Z89422 Acquired absence of other left toe(s): Secondary | ICD-10-CM | POA: Diagnosis not present

## 2023-12-09 DIAGNOSIS — B351 Tinea unguium: Secondary | ICD-10-CM | POA: Diagnosis not present

## 2023-12-09 DIAGNOSIS — I739 Peripheral vascular disease, unspecified: Secondary | ICD-10-CM | POA: Diagnosis not present

## 2023-12-09 DIAGNOSIS — M79674 Pain in right toe(s): Secondary | ICD-10-CM

## 2023-12-16 NOTE — Progress Notes (Signed)
  Subjective:  Patient ID: [name], female    DOB: 16-May-1941,  MRN: [id number]  83 y.o. female presents at risk foot care. Patient has h/o PAD with h/o amputation(s) of L 2nd toe  Chief Complaint  Patient presents with   RFC    RM#17 RFC patient states big toe nail on left foot is getting thicker would like it looked at.   New problem(s): None   PCP is [name], MD.  No Known Allergies  Review of Systems: Negative except as noted in the HPI.   Objective:  [name] is a pleasant 83 y.o. female in NAD. AAO x 3.  Vascular Examination: CFT <3 seconds b/l LE. Palpable DP/PT pulses b/l LE. Digital hair absent b/l. Skin temperature gradient WNL b/l. No pain with calf compression b/l. +1 pitting edema b/l LE. Slight warmth. No fluctuance, no ascending cellulitis. No cyanosis or clubbing noted b/l LE.  Neurological Examination: Sensation grossly intact b/l with 10 gram monofilament. Vibratory sensation intact b/l.  Dermatological Examination: Pedal skin with normal turgor, texture and tone b/l. No open wounds nor interdigital macerations noted. Anonychia noted L 3rd toe, R hallux, and R 2nd toe. Nailbed(s) epithelialized.  Toenails .bilateral 4th toes, bilateral 5th toes, left great toe, R 2nd toe, and R 3rd toe thick, discolored, elongated with subungual debris and pain on dorsal palpation. No hyperkeratotic lesions noted b/l.   Musculoskeletal Examination: Muscle strength 5/5 to all lower extremity muscle groups bilaterally. HAV with bunion deformity LLE. Lower extremity amputation(s): digital amputation L 2nd toe.  Radiographs: None  Assessment:   1. Pain due to onychomycosis of toenails of both feet   2. Status post amputation of lesser toe of left foot (HCC)   3. PVD (peripheral vascular disease) (HCC)    Plan:  Patient was evaluated and treated. All patient's and/or POA's questions/concerns addressed on today's visit. Toenails 3-5 right foot, left great toe, L  4th toe, and L 5th toe debrided in length and girth without incident. Continue soft, supportive shoe gear daily. Report any pedal injuries to medical professional. Call office if there are any questions/concerns. -Patient/POA to call should there be question/concern in the interim.  Return in about 3 months (around 03/08/2024).  Delon LITTIE Merlin, DPM      [facility] LOCATION: [address] [place] [address]                   Office [phone]   [facility] LOCATION: [address] [place], [place] [address] Office [phone]

## 2023-12-18 ENCOUNTER — Ambulatory Visit (HOSPITAL_COMMUNITY)
Admission: RE | Admit: 2023-12-18 | Discharge: 2023-12-18 | Disposition: A | Discharge status: Home / Self Care | Payer: Self-pay | Source: Ambulatory Visit | Attending: Cardiovascular Disease | Admitting: Cardiovascular Disease

## 2023-12-18 DIAGNOSIS — I739 Peripheral vascular disease, unspecified: Secondary | ICD-10-CM | POA: Diagnosis present

## 2023-12-21 LAB — VAS US ABI WITH/WO TBI
Left ABI: 1.04
Right ABI: 1.09

## 2023-12-29 ENCOUNTER — Ambulatory Visit (HOSPITAL_COMMUNITY)
Admission: RE | Admit: 2023-12-29 | Discharge: 2023-12-29 | Disposition: A | Discharge status: Home / Self Care | Payer: Medicare HMO | Source: Ambulatory Visit | Attending: Physician Assistant | Admitting: Physician Assistant

## 2023-12-29 DIAGNOSIS — R0602 Shortness of breath: Secondary | ICD-10-CM | POA: Diagnosis not present

## 2023-12-29 LAB — ECHOCARDIOGRAM COMPLETE
AR max vel: 1.66 cm2
AV Area VTI: 1.66 cm2
AV Area mean vel: 1.75 cm2
AV Mean grad: 7.5 mm[Hg]
AV Peak grad: 15.2 mm[Hg]
Ao pk vel: 1.95 m/s
Area-P 1/2: 3.48 cm2
S' Lateral: 3.47 cm

## 2024-01-04 DIAGNOSIS — J301 Allergic rhinitis due to pollen: Secondary | ICD-10-CM | POA: Diagnosis not present

## 2024-01-04 DIAGNOSIS — J3089 Other allergic rhinitis: Secondary | ICD-10-CM | POA: Diagnosis not present

## 2024-01-04 DIAGNOSIS — J3081 Allergic rhinitis due to animal (cat) (dog) hair and dander: Secondary | ICD-10-CM | POA: Diagnosis not present

## 2024-01-11 DIAGNOSIS — J3089 Other allergic rhinitis: Secondary | ICD-10-CM | POA: Diagnosis not present

## 2024-01-21 ENCOUNTER — Ambulatory Visit: Payer: Medicare PPO | Admitting: Physician Assistant

## 2024-02-04 DIAGNOSIS — J301 Allergic rhinitis due to pollen: Secondary | ICD-10-CM | POA: Diagnosis not present

## 2024-02-04 DIAGNOSIS — J3089 Other allergic rhinitis: Secondary | ICD-10-CM | POA: Diagnosis not present

## 2024-02-04 DIAGNOSIS — J3081 Allergic rhinitis due to animal (cat) (dog) hair and dander: Secondary | ICD-10-CM | POA: Diagnosis not present

## 2024-02-07 ENCOUNTER — Other Ambulatory Visit: Payer: Self-pay | Admitting: Internal Medicine

## 2024-02-07 NOTE — Progress Notes (Unsigned)
 Cardiology Office Note:    Date:  02/07/2024  ID:  [name], DOB 18-Apr-1941, MRN [id number] PCP: Aliene Beams, MD  [facility] Providers Cardiologist:  Olga Millers, MD { Click to update primary MD,subspecialty MD or APP then REFRESH:1}    {Click to Open Review  :1}   Patient Profile:      Chief Complaint: *** History of Present Illness:  [name] is a 83 y.o. female with visit-pertinent history of CAD, HLD, dementia, COPD, DJD HFpEF, bradycardia.   She had a heart catheterization in 2005 showing 40% proximal LAD, 30% distal LAD lesion, 30-40% first diagonal proximal lesion, ostial LAD 60% and nonobstructive circumflex and RCA disease.  Nuclear stress test in 2016 with an LVEF 58% and normal perfusion.  Echocardiogram April 2019 showed normal LVEF, mild LVH, grade 1 DD, mild right atrial and right ventricular enlargement, mild TR.  She was ruled out for DVT by Dopplers November 2021.  She has history of increasing bilateral lower extremity swelling with increased fluid intake.  In March 2024 she underwent short course of increased Lasix for lower extremity swelling.   Seen in clinic by Micah Flesher, PA on 11/09/2023 for bilateral lower extremity swelling.  She noted PND, orthopnea, worsening DOE that was limiting her activity.  Her 80 mg Lasix was switched to 40 mg torsemide.   Seen in the ED on 11/11/2023 for evaluation of chest pain.  She had a normal chest x-ray and a BNP of 20.  High sensitive troponin x2 was 10.  It was suggested that her leg swelling is secondary to venous stasis and not CHF.  Lab work does show possible dehydration given elevated creatinine of 1.44 and GFR 37.  Last seen in clinic on 11/20/2023.  Her SOB, DOE, orthopnea have improved however SOB was ongoing.  She also noted to have some claudication.  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  ABIs 12/18/2023 were normal.  Discussed the use of AI scribe software for clinical note transcription with the patient, who gave verbal consent to proceed.  History of Present Illness            Review of systems:  Please see the history of present illness. All other systems are reviewed and otherwise negative. ***     Home Medications:    No outpatient medications have been marked as taking for the 02/08/24 encounter (Appointment) with [name], NP.   Studies Reviewed:       *** Risk Assessment/Calculations:   {Does this patient have ATRIAL FIBRILLATION?:[phone]} No BP recorded.  {Refresh Note OR Click here to enter BP  :1}***       Physical Exam:   VS:  There were no vitals taken for this visit.   Wt Readings from Last 3 Encounters:  11/20/23 208 lb (94.3 kg)  11/11/23 203 lb (92.1 kg)  11/09/23 211 lb (95.7 kg)    GEN: Well nourished, well developed in no acute distress NECK: No JVD; No carotid bruits CARDIAC: ***RRR, no murmurs, rubs, gallops RESPIRATORY:  Clear to auscultation without rales, wheezing or rhonchi  ABDOMEN: Soft, non-tender, non-distended EXTREMITIES:  No edema; No acute deformity ***     Assessment and Plan:  Assessment and Plan              {Are  you ordering a CV Procedure (e.g. stress test, cath, DCCV, TEE, etc)?   Press F2        :[id number]}  Dispo:  No follow-ups on file.  Signed, [name], NP

## 2024-02-08 ENCOUNTER — Ambulatory Visit: Payer: No Typology Code available for payment source | Attending: Emergency Medicine | Admitting: Emergency Medicine

## 2024-02-08 ENCOUNTER — Encounter: Payer: Self-pay | Admitting: Emergency Medicine

## 2024-02-08 VITALS — BP 106/60 | HR 52 | Ht 59.0 in | Wt 208.0 lb

## 2024-02-08 DIAGNOSIS — R001 Bradycardia, unspecified: Secondary | ICD-10-CM | POA: Diagnosis not present

## 2024-02-08 DIAGNOSIS — I5032 Chronic diastolic (congestive) heart failure: Secondary | ICD-10-CM

## 2024-02-08 DIAGNOSIS — M7989 Other specified soft tissue disorders: Secondary | ICD-10-CM

## 2024-02-08 DIAGNOSIS — I77810 Thoracic aortic ectasia: Secondary | ICD-10-CM

## 2024-02-08 DIAGNOSIS — I10 Essential (primary) hypertension: Secondary | ICD-10-CM

## 2024-02-08 DIAGNOSIS — K5909 Other constipation: Secondary | ICD-10-CM | POA: Diagnosis not present

## 2024-02-08 DIAGNOSIS — M545 Low back pain, unspecified: Secondary | ICD-10-CM | POA: Diagnosis not present

## 2024-02-08 DIAGNOSIS — R0789 Other chest pain: Secondary | ICD-10-CM | POA: Diagnosis not present

## 2024-02-08 DIAGNOSIS — I2510 Atherosclerotic heart disease of native coronary artery without angina pectoris: Secondary | ICD-10-CM | POA: Diagnosis not present

## 2024-02-08 DIAGNOSIS — K219 Gastro-esophageal reflux disease without esophagitis: Secondary | ICD-10-CM | POA: Diagnosis not present

## 2024-02-08 DIAGNOSIS — E785 Hyperlipidemia, unspecified: Secondary | ICD-10-CM

## 2024-02-08 NOTE — Patient Instructions (Signed)
 Medication Instructions:  NO CHANGES    Lab Work: NONE    Testing/Procedures: NONE   Follow-Up: At [facility], you and your health needs are our priority.  As part of our continuing mission to provide you with exceptional heart care, we have created designated Provider Care Teams.  These Care Teams include your primary Cardiologist (physician) and Advanced Practice Providers (APPs -  Physician Assistants and Nurse Practitioners) who all work together to provide you with the care you need, when you need it.    Your next appointment:   6 month(s)  Provider:   DR. Flora Lipps  Other Instructions:

## 2024-02-11 DIAGNOSIS — J3081 Allergic rhinitis due to animal (cat) (dog) hair and dander: Secondary | ICD-10-CM | POA: Diagnosis not present

## 2024-02-11 DIAGNOSIS — J301 Allergic rhinitis due to pollen: Secondary | ICD-10-CM | POA: Diagnosis not present

## 2024-02-11 DIAGNOSIS — J3089 Other allergic rhinitis: Secondary | ICD-10-CM | POA: Diagnosis not present

## 2024-02-17 DIAGNOSIS — R197 Diarrhea, unspecified: Secondary | ICD-10-CM | POA: Diagnosis not present

## 2024-02-18 DIAGNOSIS — J3089 Other allergic rhinitis: Secondary | ICD-10-CM | POA: Diagnosis not present

## 2024-02-19 DIAGNOSIS — M545 Low back pain, unspecified: Secondary | ICD-10-CM | POA: Diagnosis not present

## 2024-02-24 DIAGNOSIS — Z1211 Encounter for screening for malignant neoplasm of colon: Secondary | ICD-10-CM | POA: Diagnosis not present

## 2024-02-25 DIAGNOSIS — J3089 Other allergic rhinitis: Secondary | ICD-10-CM | POA: Diagnosis not present

## 2024-02-25 DIAGNOSIS — J3081 Allergic rhinitis due to animal (cat) (dog) hair and dander: Secondary | ICD-10-CM | POA: Diagnosis not present

## 2024-02-25 DIAGNOSIS — J301 Allergic rhinitis due to pollen: Secondary | ICD-10-CM | POA: Diagnosis not present

## 2024-02-29 DIAGNOSIS — J3089 Other allergic rhinitis: Secondary | ICD-10-CM | POA: Diagnosis not present

## 2024-03-02 DIAGNOSIS — J3089 Other allergic rhinitis: Secondary | ICD-10-CM | POA: Diagnosis not present

## 2024-03-04 DIAGNOSIS — M545 Low back pain, unspecified: Secondary | ICD-10-CM | POA: Diagnosis not present

## 2024-03-08 ENCOUNTER — Other Ambulatory Visit: Payer: Self-pay | Admitting: Allergy and Immunology

## 2024-03-08 ENCOUNTER — Ambulatory Visit
Admission: RE | Admit: 2024-03-08 | Discharge: 2024-03-08 | Disposition: A | Discharge status: Home / Self Care | Source: Ambulatory Visit | Attending: Allergy and Immunology | Admitting: Allergy and Immunology

## 2024-03-08 DIAGNOSIS — K219 Gastro-esophageal reflux disease without esophagitis: Secondary | ICD-10-CM | POA: Diagnosis not present

## 2024-03-08 DIAGNOSIS — J3089 Other allergic rhinitis: Secondary | ICD-10-CM | POA: Diagnosis not present

## 2024-03-08 DIAGNOSIS — J4540 Moderate persistent asthma, uncomplicated: Secondary | ICD-10-CM | POA: Diagnosis not present

## 2024-03-08 DIAGNOSIS — R052 Subacute cough: Secondary | ICD-10-CM

## 2024-03-08 DIAGNOSIS — R059 Cough, unspecified: Secondary | ICD-10-CM | POA: Diagnosis not present

## 2024-03-15 NOTE — Progress Notes (Signed)
 Chief Complaint: Primary GI MD: Dr. Russella Dar  HPI: 83 year old female history of HFpEF (LVEF 55 to 60% January 2025), CAD (2005), GERD, and others as listed below presents for evaluation of  Seen by Doug Sou, PA-C January 2024 for diarrhea occurring up to 9 times a day with CT scan showing moderate stool throughout the colon.  She was recommended to do MiraLAX bowel purge and begin on a regular dose of MiraLAX for overflow diarrhea   Discussed the use of AI scribe software for clinical note transcription with the patient, who gave verbal consent to proceed.  History of Present Illness      PREVIOUS GI WORKUP   Her last colonoscopy was in November 2014 at which time she had a couple of small polyps removed that were hyperplastic on pathology so she is given a 10-year colonoscopy recall.   Past Medical History:  Diagnosis Date   Allergic rhinitis    Arnold-Chiari malformation (HCC)    Asthma    CAD (coronary artery disease)    COPD (chronic obstructive pulmonary disease) (HCC)    Coughing    coughing since last 01-22-2019 , started on cefdinir bid x10 days, has 3 pills left today , reports she feels mcuh better , still coughing copiuus amonts of thick white sputum , denies fever nor chills, nor body aches .    DJD (degenerative joint disease)    GERD (gastroesophageal reflux disease)    Hiatal hernia    History of absence seizures    last confirmed seizure age 26     Hx of colonic polyps    Hypercholesteremia    Hypertension    Obesity    Positive PPD    Reflex sympathetic dystrophy    Sleep apnea    Varicose veins    Venous insufficiency     Past Surgical History:  Procedure Laterality Date   ABDOMINAL HYSTERECTOMY     CHOLECYSTECTOMY N/A 02/19/2023   Procedure: LAPAROSCOPIC CHOLECYSTECTOMY WITH INTRAOPERATIVE CHOLANGIOGRAM AND ICG DYE;  Surgeon: Gaynelle Adu, MD;  Location: [facility] OR;  Service: General;  Laterality: N/A;   cspine surgery  1993   for arnold-chiari  malformation   IR BONE MARROW BIOPSY & ASPIRATION  01/28/2023   JOINT REPLACEMENT  06/05/11   Left total knee replacement   right knee arthroscopy  12/2007   Dr. Shelle Iron   right total knee replacement  05/2008   Dr. Shelle Iron   TOTAL KNEE REVISION Left 02/03/2019   Procedure: LEFT TOTAL KNEE REVISION;  Surgeon: Samson Frederic, MD;  Location: [facility] ORS;  Service: Orthopedics;  Laterality: Left;    Current Outpatient Medications  Medication Sig Dispense Refill   acetaminophen (TYLENOL) 500 MG tablet Take 1,000 mg by mouth every 6 (six) hours as needed for moderate pain.     albuterol (PROVENTIL HFA;VENTOLIN HFA) 108 (90 BASE) MCG/ACT inhaler Inhale 2 puffs into the lungs every 4 (four) hours as needed. For wheeze or shortness of breath 1 Inhaler 6   alendronate (FOSAMAX) 70 MG tablet TAKE ONE TABLET BY MOUTH ONCE WEEKLY ON WEDNESDAY 4 tablet 1   aspirin 81 MG tablet Take 81 mg by mouth daily.     azelastine (ASTELIN) 0.1 % nasal spray Place 1 spray into both nostrils 2 (two) times daily as needed for rhinitis or allergies.     budesonide-formoterol (SYMBICORT) 80-4.5 MCG/ACT inhaler Inhale 2 puffs into the lungs 2 (two) times daily as needed (shortness of breath).     citalopram (CELEXA) 10  MG tablet Take 1 tablet (10 mg total) by mouth daily. (Patient taking differently: Take 20 mg by mouth daily.) 90 tablet 3   CVS D3 50 MCG (2000 UT) CAPS Take 1 capsule by mouth daily.     donepezil (ARICEPT ODT) 5 MG disintegrating tablet Take 1 tablet (5 mg total) by mouth at bedtime. 90 tablet 3   donepezil (ARICEPT) 5 MG tablet Take 5 mg by mouth at bedtime.     EPINEPHrine 0.3 mg/0.3 mL IJ SOAJ injection Inject 0.3 mg into the muscle as needed for anaphylaxis.     famotidine (PEPCID) 20 MG tablet TAKE ONE TABLET BY MOUTH TWICE DAILY 60 tablet 1   ferrous sulfate 325 (65 FE) MG EC tablet Take 325 mg by mouth daily.     fluticasone (FLONASE) 50 MCG/ACT nasal spray Place 1 spray into both nostrils 2 (two) times daily  as needed for allergies or rhinitis.     lidocaine (LIDODERM) 5 % Place 1 patch onto the skin daily. Remove & Discard patch within 12 hours or as directed by MD (Patient taking differently: Place 1 patch onto the skin daily as needed (pain). Remove & Discard patch within 12 hours or as directed by MD) 7 patch 0   loperamide (IMODIUM A-D) 2 MG tablet Take 2 mg by mouth as needed for diarrhea or loose stools.     meloxicam (MOBIC) 15 MG tablet TAKE ONE TABLET BY MOUTH ONCE daily AS NEEDED FOR PAIN 30 tablet 2   montelukast (SINGULAIR) 10 MG tablet TAKE 1 TABLET BY MOUTH EVERY DAY IN THE EVENING 90 tablet 1   mupirocin cream (BACTROBAN) 2 % Apply 1 Application topically 2 (two) times daily. 15 g 0   pantoprazole (PROTONIX) 40 MG tablet TAKE 1 TABLET BY MOUTH EVERY DAY 90 tablet 1   potassium chloride SA (KLOR-CON M20) 20 MEQ tablet Take 1 tablet (20 mEq total) by mouth 2 (two) times daily. 180 tablet 3   predniSONE (DELTASONE) 20 MG tablet      rosuvastatin (CRESTOR) 40 MG tablet Take 1 tablet (40 mg total) by mouth daily. 90 tablet 3   Tiotropium Bromide Monohydrate (SPIRIVA RESPIMAT) 1.25 MCG/ACT AERS Inhale 1.25 mcg into the lungs daily.     tiZANidine (ZANAFLEX) 2 MG tablet Take 1 tablet (2 mg total) by mouth every 6 (six) hours as needed for muscle spasms. 40 tablet 1   Torsemide 40 MG TABS Take 40 mg every morning 30 tablet 6   traZODone (DESYREL) 100 MG tablet TAKE ONE TABLET BY MOUTH EVERYDAY AT BEDTIME 90 tablet 2   No current facility-administered medications for this visit.   Facility-Administered Medications Ordered in Other Visits  Medication Dose Route Frequency Provider Last Rate Last Admin   Spy Agent Green / Firefly Optime  1.25 mg Intravenous Once Gaynelle Adu, MD        Allergies as of 03/17/2024   (No Known Allergies)    Family History  Problem Relation Age of Onset   Heart disease Mother    Stroke Mother    Stroke Father    Clotting disorder Father    Colon cancer  Neg Hx    Stomach cancer Neg Hx     Social History   Socioeconomic History   Marital status: Widowed    Spouse name: Not on file   Number of children: 2   Years of education: 53   Highest education level: Not on file  Occupational History   Occupation: retired  Tobacco Use   Smoking status: Former    Current packs/day: 0.00    Average packs/day: 0.3 packs/day for 12.0 years (3.6 ttl pk-yrs)    Types: Cigarettes    Start date: 01/02/2000    Quit date: 01/02/2012    Years since quitting: 12.2   Smokeless tobacco: Never   Tobacco comments:    1 pack per week  Vaping Use   Vaping status: Never Used  Substance and Sexual Activity   Alcohol use: No   Drug use: No   Sexual activity: Not Currently  Other Topics Concern   Not on file  Social History Narrative   Fun/Hobby: Playing cards and mingling.   Denies abuse and feels safe at home.    Social Drivers of Corporate investment banker Strain: Not on file  Food Insecurity: Not on file  Transportation Needs: Not on file  Physical Activity: Not on file  Stress: Not on file  Social Connections: Not on file  Intimate Partner Violence: Not on file    Review of Systems:    Constitutional: No weight loss, fever, chills, weakness or fatigue HEENT: Eyes: No change in vision               Ears, Nose, Throat:  No change in hearing or congestion Skin: No rash or itching Cardiovascular: No chest pain, chest pressure or palpitations   Respiratory: No SOB or cough Gastrointestinal: See HPI and otherwise negative Genitourinary: No dysuria or change in urinary frequency Neurological: No headache, dizziness or syncope Musculoskeletal: No new muscle or joint pain Hematologic: No bleeding or bruising Psychiatric: No history of depression or anxiety    Physical Exam:  Vital signs: There were no vitals taken for this visit.  Constitutional: NAD, alert and cooperative Head:  Normocephalic and atraumatic. Eyes:   PEERL, EOMI. No  icterus. Conjunctiva pink. Respiratory: Respirations even and unlabored. Lungs clear to auscultation bilaterally.   No wheezes, crackles, or rhonchi.  Cardiovascular:  Regular rate and rhythm. No peripheral edema, cyanosis or pallor.  Gastrointestinal:  Soft, nondistended, nontender. No rebound or guarding. Normal bowel sounds. No appreciable masses or hepatomegaly. Rectal:  Declines Msk:  Symmetrical without gross deformities. Without edema, no deformity or joint abnormality.  Neurologic:  Alert and  oriented x4;  grossly normal neurologically.  Skin:   Dry and intact without significant lesions or rashes. Psychiatric: Oriented to person, place and time. Demonstrates good judgement and reason without abnormal affect or behaviors.  Physical Exam    RELEVANT LABS AND IMAGING: CBC    Component Value Date/Time   WBC 3.9 (L) 11/11/2023 2127   RBC 4.65 11/11/2023 2127   HGB 12.5 11/11/2023 2127   HCT 41.4 11/11/2023 2127   HCT 33.4 (L) 12/20/2022 1205   PLT 192 11/11/2023 2127   MCV 89.0 11/11/2023 2127   MCH 26.9 11/11/2023 2127   MCHC 30.2 11/11/2023 2127   RDW 14.5 11/11/2023 2127   LYMPHSABS 0.9 01/28/2023 0900   MONOABS 0.3 01/28/2023 0900   EOSABS 0.1 01/28/2023 0900   BASOSABS 0.0 01/28/2023 0900    CMP     Component Value Date/Time   NA 143 11/20/2023 1635   K 4.3 11/20/2023 1635   CL 102 11/20/2023 1635   CO2 26 11/20/2023 1635   GLUCOSE 80 11/20/2023 1635   GLUCOSE 121 (H) 11/11/2023 2127   BUN 17 11/20/2023 1635   CREATININE 1.08 (H) 11/20/2023 1635   CREATININE 0.79 12/20/2022 1204   CALCIUM 9.5 11/20/2023  1635   PROT 6.6 12/20/2022 1204   PROT 6.9 11/07/2019 1605   ALBUMIN 3.6 12/20/2022 1204   ALBUMIN 4.2 11/07/2019 1605   AST 15 12/20/2022 1204   ALT 7 12/20/2022 1204   ALKPHOS 62 12/20/2022 1204   BILITOT 0.4 12/20/2022 1204   GFRNONAA 37 (L) 11/11/2023 2127   GFRNONAA >60 12/20/2022 1204   GFRAA 84 11/07/2019 1605     Assessment/Plan:    HFpEF Echocardiogram January 2025 with LVEF 55 to 60%  CAD Nonobstructive disease by heart catheterization in 2005 with a low risk nuclear stress test 2016  Diarrhea Seen January 2024 negative stool studies and CT showing moderate stool burden with recommendation to do bowel purge.  Colonoscopy 2014 with hyperplastic polyps and repeat of 10 years       [name] [facility] 03/15/2024, 12:38 PM  Cc: Dorena Gander, MD

## 2024-03-17 ENCOUNTER — Ambulatory Visit (INDEPENDENT_AMBULATORY_CARE_PROVIDER_SITE_OTHER): Admitting: Gastroenterology

## 2024-03-17 ENCOUNTER — Encounter: Payer: Self-pay | Admitting: Gastroenterology

## 2024-03-17 VITALS — BP 124/70 | HR 40 | Ht 59.5 in | Wt 207.2 lb

## 2024-03-17 DIAGNOSIS — R1030 Lower abdominal pain, unspecified: Secondary | ICD-10-CM

## 2024-03-17 DIAGNOSIS — I2510 Atherosclerotic heart disease of native coronary artery without angina pectoris: Secondary | ICD-10-CM

## 2024-03-17 DIAGNOSIS — K5900 Constipation, unspecified: Secondary | ICD-10-CM

## 2024-03-17 DIAGNOSIS — Z860102 Personal history of hyperplastic colon polyps: Secondary | ICD-10-CM

## 2024-03-17 DIAGNOSIS — J4489 Other specified chronic obstructive pulmonary disease: Secondary | ICD-10-CM

## 2024-03-17 NOTE — Patient Instructions (Addendum)
 _______________________________________________________  If your blood pressure at your visit was 140/90 or greater, please contact your primary care physician to follow up on this.  _______________________________________________________  If you are age 81 or older, your body mass index should be between 23-30. Your Body mass index is 41.16 kg/m. If this is out of the aforementioned range listed, please consider follow up with your Primary Care Provider.  If you are age 68 or younger, your body mass index should be between 19-25. Your Body mass index is 41.16 kg/m. If this is out of the aformentioned range listed, please consider follow up with your Primary Care Provider.   ________________________________________________________  The [facility] providers would like to encourage you to use MYCHART to communicate with providers for non-urgent requests or questions.  Due to long hold times on the telephone, sending your provider a message by [facility] may be a faster and more efficient way to get a response.  Please allow 48 business hours for a response.  Please remember that this is for non-urgent requests.  _______________________________________________________  We have given you samples of the following medication to take: Linzess  You have been scheduled for an appointment with Everett Hitt  on  06-16-24 at 10am . Please arrive 10 minutes early for your appointment.  Please call us  with any questions or concerns  Thank you for trusting me with your gastrointestinal care!   Suzanna Erp, PA

## 2024-03-24 ENCOUNTER — Telehealth: Payer: Self-pay | Admitting: Gastroenterology

## 2024-03-24 NOTE — Telephone Encounter (Signed)
 I have spoken to patient who states she has been taking linzess 72 mcg daily for 1 week and has had only 1 bowel movement. Patient has not tried addition of high fiber foods, benefiber or increased water  intake as previously recommended. She states she will do this. Bayley, do you want to change anything at this time?

## 2024-03-24 NOTE — Telephone Encounter (Signed)
 Inbound call from patient, states the medication prescribed on 4/17, Linzess is not working, patient would like to discuss more options.

## 2024-03-25 DIAGNOSIS — J301 Allergic rhinitis due to pollen: Secondary | ICD-10-CM | POA: Diagnosis not present

## 2024-03-25 DIAGNOSIS — J3089 Other allergic rhinitis: Secondary | ICD-10-CM | POA: Diagnosis not present

## 2024-03-25 DIAGNOSIS — J3081 Allergic rhinitis due to animal (cat) (dog) hair and dander: Secondary | ICD-10-CM | POA: Diagnosis not present

## 2024-03-25 MED ORDER — LINACLOTIDE 145 MCG PO CAPS: 145.0000 ug | ORAL_CAPSULE | Freq: Every day | ORAL | 0 refills | Status: DC

## 2024-03-25 NOTE — Telephone Encounter (Signed)
 PA Team-  Can you help with Linzess 145 mcg PA on this patient?

## 2024-03-25 NOTE — Telephone Encounter (Signed)
 I have spoken with patient to advise we can send increased dosage for Linzess for her to take daily x 30 days in addition to increased fiber intake and increased water  intake. Patient is in agreement with this and will reach out if medicine change is still not effective. Linzess 145 mcg dosing sent to pharmacy.

## 2024-03-25 NOTE — Telephone Encounter (Signed)
 PT returning call to advise that new script for Linzess is not covered by insurance and she needs an alternative. Please advise.

## 2024-03-28 ENCOUNTER — Telehealth: Payer: Self-pay

## 2024-03-28 ENCOUNTER — Other Ambulatory Visit (HOSPITAL_COMMUNITY): Payer: Self-pay

## 2024-03-28 MED ORDER — LUBIPROSTONE 8 MCG PO CAPS: 8.0000 ug | ORAL_CAPSULE | Freq: Two times a day (BID) | ORAL | 3 refills | Status: DC

## 2024-03-28 NOTE — Telephone Encounter (Signed)
 Amitiza twice daily sent. Patient is advised.

## 2024-03-28 NOTE — Addendum Note (Signed)
 Addended by: Glennette Lanius on: 03/28/2024 01:56 PM   Modules accepted: Orders

## 2024-03-28 NOTE — Telephone Encounter (Signed)
 [name]-  Patient is unable to get Linzess as it is not cost effective. Please advise of any alternative you think may be appropriate.

## 2024-03-28 NOTE — Telephone Encounter (Signed)
 Tried submitting PA, but is not required.

## 2024-03-28 NOTE — Telephone Encounter (Signed)
 Pharmacy Patient Advocate Encounter   Received notification from Pt Calls Messages that prior authorization for Linzess 145MCG capsules is required/requested.   Insurance verification completed.   The patient is insured through [facility] Medicare .   Per test claim: PA not required

## 2024-03-30 ENCOUNTER — Encounter: Payer: Self-pay | Admitting: Podiatry

## 2024-03-30 ENCOUNTER — Other Ambulatory Visit: Payer: Self-pay

## 2024-03-30 ENCOUNTER — Ambulatory Visit (INDEPENDENT_AMBULATORY_CARE_PROVIDER_SITE_OTHER): Payer: Medicare HMO | Admitting: Podiatry

## 2024-03-30 DIAGNOSIS — B351 Tinea unguium: Secondary | ICD-10-CM

## 2024-03-30 DIAGNOSIS — M79674 Pain in right toe(s): Secondary | ICD-10-CM

## 2024-03-30 DIAGNOSIS — I739 Peripheral vascular disease, unspecified: Secondary | ICD-10-CM

## 2024-03-30 DIAGNOSIS — M79675 Pain in left toe(s): Secondary | ICD-10-CM

## 2024-03-30 DIAGNOSIS — Z89422 Acquired absence of other left toe(s): Secondary | ICD-10-CM

## 2024-03-30 MED ORDER — TORSEMIDE 40 MG PO TABS: ORAL_TABLET | ORAL | 10 refills | Status: DC

## 2024-04-05 NOTE — Progress Notes (Unsigned)
  Subjective:  Patient ID: [name], female    DOB: Aug 17, 1941,  MRN: [id number]  [name] presents to clinic today for at risk foot care. Patient has h/o PAD with h/o amputation(s) of left second digit and painful mycotic toenails of both feet that are difficult to trim. Pain interferes with daily activities and wearing enclosed shoe gear comfortably.  Chief Complaint  Patient presents with   Nail Problem    "Trim them."   New problem(s): None.   PCP is Dorena Gander, MD.  No Known Allergies  Review of Systems: Negative except as noted in the HPI.  Objective: No changes noted in today's physical examination. There were no vitals filed for this visit. April INGEMI is a pleasant 83 y.o. female in NAD. AAO x 3.  Vascular Examination: Capillary refill time immediate b/l. Vascular status intact b/l with palpable pedal pulses. Pedal hair absent b/l. No pain with calf compression b/l. Skin temperature gradient WNL b/l. No cyanosis or clubbing b/l. No ischemia or gangrene noted b/l. Trace edema noted BLE.  Neurological Examination: Sensation grossly intact b/l with 10 gram monofilament. Vibratory sensation intact b/l.   Dermatological Examination: Pedal skin with normal turgor, texture and tone b/l.  No open wounds. No interdigital macerations.   Toenails 4th, 5th toes b/l, left hallux, right 2nd, right 3rd  thick, discolored, elongated with subungual debris and pain on dorsal palpation.   Anonychia noted L 3rd toe and right great toe. Nailbed(s) epithelialized.   Musculoskeletal Examination: Muscle strength 5/5 to all lower extremity muscle groups bilaterally. Lower extremity amputation(s): digital amputation {jgPodToeLocator:[id number]}. HAV with bunion deformity noted b/l LE.  Radiographs: None  Assessment/Plan: 1. Pain due to onychomycosis of toenails of both feet   2. Status post amputation of lesser toe of left foot (HCC)   3. PVD (peripheral vascular disease) (HCC)      No orders of the defined types were placed in this encounter.   None {Jgplan:23602::"-Patient/POA to call should there be question/concern in the interim."}   Return in about 3 months (around 06/29/2024).  [name], DPM      [place] LOCATION: [address] [place] [address]                   Office [phone]   [facility] LOCATION: [address] [place], [place] [address] Office [phone]

## 2024-04-07 DIAGNOSIS — R7301 Impaired fasting glucose: Secondary | ICD-10-CM | POA: Diagnosis not present

## 2024-04-07 DIAGNOSIS — R202 Paresthesia of skin: Secondary | ICD-10-CM | POA: Diagnosis not present

## 2024-04-07 DIAGNOSIS — R001 Bradycardia, unspecified: Secondary | ICD-10-CM | POA: Diagnosis not present

## 2024-04-07 DIAGNOSIS — M4807 Spinal stenosis, lumbosacral region: Secondary | ICD-10-CM | POA: Diagnosis not present

## 2024-04-21 NOTE — Progress Notes (Signed)
 Addendum: Reviewed and agree with assessment and management plan. Asha Grumbine, Carie Caddy, MD

## 2024-04-24 ENCOUNTER — Other Ambulatory Visit: Payer: Self-pay | Admitting: Gastroenterology

## 2024-04-26 ENCOUNTER — Other Ambulatory Visit: Payer: Self-pay | Admitting: Gastroenterology

## 2024-04-26 ENCOUNTER — Other Ambulatory Visit (HOSPITAL_COMMUNITY): Payer: Self-pay

## 2024-04-26 ENCOUNTER — Telehealth: Payer: Self-pay

## 2024-04-26 DIAGNOSIS — M5416 Radiculopathy, lumbar region: Secondary | ICD-10-CM | POA: Diagnosis not present

## 2024-04-26 NOTE — Telephone Encounter (Signed)
 PA request has been Approved. New Encounter has been or will be created for follow up. For additional info see Pharmacy Prior Auth telephone encounter from 05/27.

## 2024-04-26 NOTE — Telephone Encounter (Signed)
*  Gastro  Pharmacy Patient Advocate Encounter  Received notification from [facility] that Prior Authorization for Amitiza  capsules  has been APPROVED from 01/27/2024 to 04/26/2025. Ran test claim, Copay is $126.00. This test claim was processed through [facility]- copay amounts may vary at other pharmacies due to pharmacy/plan contracts, or as the patient moves through the different stages of their insurance plan.   PA #/Case ID/Reference #: [id number]

## 2024-04-27 ENCOUNTER — Telehealth: Payer: Self-pay | Admitting: Gastroenterology

## 2024-04-27 NOTE — Telephone Encounter (Signed)
 Inbound call from patient stating she is still experiencing constipation states medication has not been helping. Patient is requesting a call back. Please advise, thank you.

## 2024-04-28 NOTE — Telephone Encounter (Signed)
 Pt calling states she is taking Amitiza  8mcg BID and her fiber supplement. . Pt reports this regimen is not working. Pt states she had a BM yesterday and she felt pretty empty but the stool was very loose. Reports she has BM about 2-3 times/week but at times she is not able to control her bowels. She feels like she cannot go anywhere because she slways feels like she needs to have a BM. Pt also reports she goes back and forth between constipation and diarrhea. She wanted to let Bayley know how she was doing. Please advise.

## 2024-04-28 NOTE — Telephone Encounter (Signed)
 Spoke with pt and she states she will try taking the Amitiza  8mcg every other day and call back if this does not help.

## 2024-04-30 DIAGNOSIS — F339 Major depressive disorder, recurrent, unspecified: Secondary | ICD-10-CM | POA: Diagnosis not present

## 2024-04-30 DIAGNOSIS — I2510 Atherosclerotic heart disease of native coronary artery without angina pectoris: Secondary | ICD-10-CM | POA: Diagnosis not present

## 2024-04-30 DIAGNOSIS — M810 Age-related osteoporosis without current pathological fracture: Secondary | ICD-10-CM | POA: Diagnosis not present

## 2024-05-02 ENCOUNTER — Encounter: Payer: Self-pay | Admitting: Emergency Medicine

## 2024-05-02 ENCOUNTER — Ambulatory Visit: Attending: Emergency Medicine | Admitting: Emergency Medicine

## 2024-05-02 VITALS — BP 122/66 | HR 43 | Ht 59.5 in | Wt 205.0 lb

## 2024-05-02 DIAGNOSIS — E785 Hyperlipidemia, unspecified: Secondary | ICD-10-CM

## 2024-05-02 DIAGNOSIS — I5032 Chronic diastolic (congestive) heart failure: Secondary | ICD-10-CM

## 2024-05-02 DIAGNOSIS — I77810 Thoracic aortic ectasia: Secondary | ICD-10-CM | POA: Diagnosis not present

## 2024-05-02 DIAGNOSIS — R001 Bradycardia, unspecified: Secondary | ICD-10-CM | POA: Diagnosis not present

## 2024-05-02 DIAGNOSIS — I2510 Atherosclerotic heart disease of native coronary artery without angina pectoris: Secondary | ICD-10-CM | POA: Diagnosis not present

## 2024-05-02 DIAGNOSIS — I10 Essential (primary) hypertension: Secondary | ICD-10-CM | POA: Diagnosis not present

## 2024-05-02 NOTE — Progress Notes (Signed)
 Cardiology Office Note:    Date:  05/02/2024  ID:  [name], DOB December 04, 1940, MRN [id number] PCP: Dorena Gander, MD   HeartCare Providers Cardiologist:  Alexandria Angel, MD       Patient Profile:       Chief Complaint: Acute visit for bradycardia History of Present Illness:  [name] is a 83 y.o. female with visit-pertinent history of CAD, HLD, dementia, COPD, DJD HFpEF, bradycardia.   She had a heart catheterization in 2005 showing 40% proximal LAD, 30% distal LAD lesion, 30-40% first diagonal proximal lesion, ostial LAD 60% and nonobstructive circumflex and RCA disease.  Nuclear stress test in 2016 with an LVEF 58% and normal perfusion.  Echocardiogram April 2019 showed normal LVEF, mild LVH, grade 1 DD, mild right atrial and right ventricular enlargement, mild TR.  She was ruled out for DVT by Dopplers November 2021.  She has history of increasing bilateral lower extremity swelling with increased fluid intake.  In March 2024 she underwent short course of increased Lasix  for lower extremity swelling.   Seen in clinic by Marcie Sever, PA on 11/09/2023 for bilateral lower extremity swelling.  She noted PND, orthopnea, worsening DOE that was limiting her activity.  Her 80 mg Lasix  was switched to 40 mg torsemide .   Seen in the ED on 11/11/2023 for evaluation of chest pain.  She had a normal chest x-ray and a BNP of 20.  High sensitive troponin x2 was 10.  It was suggested that her leg swelling is secondary to venous stasis and not CHF.  Lab work does show possible dehydration given elevated creatinine of 1.44 and GFR 37.   Seen in clinic on 11/20/2023.  Her SOB, DOE, orthopnea have improved however SOB was ongoing.  She also noted to have some claudication.  Echocardiogram was ordered and completed on 12/29/2023 showing LVEF 55-60%, no RWMA, RV function and size normal, normal PASP, biatrial size moderately dilated, no valvular abnormalities, mild dilation of ascending aorta  measuring 41 mm.  ABIs 12/18/2023 were normal.  Patient was last seen in clinic on 02/08/2024.  The patient's leg swelling has improved.  She had noted some epigastric/substernal pain particularly after eating.  The pain occurred between 7 and 8:00 each night approximately 1 hour after eating dinner.  The pain was relieved by Tums and sitting upward.  She was to follow-up with PCP regarding GERD.  She was completing water  aerobics and other activities without any exertional symptoms.  She was euvolemic and well compensated on exam.  She has a follow-up in 6 months.  Recently seen by PCP.  She was noted to be bradycardic on exam.  She was to follow-up with cardiology.   Discussed the use of AI scribe software for clinical note transcription with the patient, who gave verbal consent to proceed.  History of Present Illness [name] is an 83 year old female who presents for bradycardia.  She is accompanied by her cousin. She was referred by her primary care doctor for evaluation of her low heart rate.  Today patient reports she is doing well overall.  She is without any acute cardiovascular concerns or complaints.  She walks with her walker without limitation.  No recent falls.  No concerning symptoms with her bradycardia.  Patient has a long history of bradycardia dating back to when she first established with cardiology service in 2013.  She denies any new and/or acute symptoms with relation to her bradycardia.  She denies dizziness,  syncope, presyncope, dyspnea, lightheadedness, or falls.  She notes that she does get "wobbly" without her walker.  She denies fatigue, chest pain, or shortness of breath. She occasionally experiences pain in her left breast without specific activity.  Chronic leg swelling well-managed on current diuretic.  Review of systems:  Please see the history of present illness. All other systems are reviewed and otherwise negative.      Studies Reviewed:    EKG  Interpretation Date/Time:  Monday May 02 2024 14:21:35 EDT Ventricular Rate:  43 PR Interval:  154 QRS Duration:  98 QT Interval:  468 QTC Calculation: 395 R Axis:   -12  Text Interpretation: Marked sinus bradycardia Moderate voltage criteria for LVH, may be normal variant ( R in aVL , Cornell product ) Nonspecific T wave abnormality When compared with ECG of 11-Nov-2023 21:24, PREVIOUS ECG IS PRESENT Confirmed by Palmer Bobo [phone]) on 05/02/2024 5:05:55 PM    Echocardiogram 12/29/2023  1. Left ventricular ejection fraction, by estimation, is 55 to 60%. The  left ventricle has normal function. The left ventricle has no regional  wall motion abnormalities. Left ventricular diastolic parameters are  indeterminate.   2. Right ventricular systolic function is normal. The right ventricular  size is normal. There is normal pulmonary artery systolic pressure.   3. Left atrial size was moderately dilated.   4. Right atrial size was moderately dilated.   5. The mitral valve is normal in structure. No evidence of mitral valve  regurgitation. No evidence of mitral stenosis.   6. The aortic valve is tricuspid. Aortic valve regurgitation is not  visualized. No aortic stenosis is present.   7. Aortic dilatation noted. There is mild dilatation of the ascending  aorta, measuring 41 mm.   8. The inferior vena cava is normal in size with greater than 50%  respiratory variability, suggesting right atrial pressure of 3 mmHg.  Risk Assessment/Calculations:              Physical Exam:   VS:  BP 122/66 (BP Location: Left Arm, Patient Position: Sitting, Cuff Size: Normal)   Pulse (!) 43   Ht 4' 11.5" (1.511 m)   Wt 205 lb (93 kg)   BMI 40.71 kg/m    Wt Readings from Last 3 Encounters:  05/02/24 205 lb (93 kg)  03/17/24 207 lb 4 oz (94 kg)  02/08/24 208 lb (94.3 kg)    GEN: Well nourished, well developed in no acute distress NECK: No JVD; No carotid bruits CARDIAC: RRR, no murmurs, rubs,  gallops RESPIRATORY:  Clear to auscultation without rales, wheezing or rhonchi  ABDOMEN: Soft, non-tender, non-distended EXTREMITIES: Trace bilateral pedal edema; No acute deformity      Assessment and Plan:  Bradycardia Referred by PCP to be evaluated for bradycardia Since establishing with cardiology service in 2013 patient's HR has been consistently in the 40s-50s - Recently (HR 44 on 01/2023, 47 on 08/2023, 51 on 11/2023, and 52 on 01/2024) - EKG today shows heart rate 43 bpm - Today she is asymptomatic without syncope, presyncope, lightheadedness, dizziness, falls - Spoke with the EP APP regarding case.  There is no indication for pacemaker at this time as patient remains asymptomatic and there has been no acute changes to heart rate as she has had stable chronic bradycardia.  If patient becomes symptomatic in the future can refer to EP at that time - Will order 7-day ZIO monitor for any significant pauses or blocks - BMET, MAG, TSH today -  Avoid AV nodal blocking agents  HFpEF Echocardiogram 12/2023 with LVEF 55-60%, no RWMA, RV normal, no valvular abnormalities - Today she is euvolemic and well compensated on exam.  NYHA class II - No SOB, DOE, orthopnea, PND.  LE edema well-controlled - She continues with water  aerobics without limitation or exertional symptoms - Continue leg elevation, sodium restriction, lower extremity stockings - Continue torsemide  40 mg daily and potassium chloride  20 mEq daily twice daily   Coronary artery disease / Hyperlipidemia  Nonobstructive disease by heart catheterization in 2005.  Reassuring and low risk nuclear stress test in 2016.    LDL 62 on 09/10/2023 and below goal of 70 - Today and since last office visit she is without any anginal symptoms, no indication for further ischemic evaluation at this time - Continue aspirin  81 mg daily, rosuvastatin  40 mg   Hypertension  BP today 106/60 and under excellent control - Continue current  antihypertensive regimen   Ascending aorta dilation Echocardiogram 12/2023 showing mild dilation of the ascending aorta measuring 41 mm - Consider repeating echocardiogram in 1 year for routine monitoring      Dispo:  Return in about 3 months (around 08/02/2024).  Signed, Ava Boatman, NP

## 2024-05-02 NOTE — Patient Instructions (Signed)
 Medication Instructions:  NO CHANGES    Lab Work: BMET, TSH, AND MAGNESIUM TO BE DONE TODAY.  Testing/Procedures: Delane Fear- Long Term Monitor Instructions  Your physician has requested you wear a ZIO patch monitor for 7 days.  This is a single patch monitor. [employer] supplies one patch monitor per enrollment. Additional stickers are not available. Please do not apply patch if you will be having a Nuclear Stress Test,  Echocardiogram, Cardiac CT, MRI, or Chest Xray during the period you would be wearing the  monitor. The patch cannot be worn during these tests. You cannot remove and re-apply the  ZIO XT patch monitor.  Your ZIO patch monitor will be mailed 3 day USPS to your address on file. It may take 3-5 days  to receive your monitor after you have been enrolled.  Once you have received your monitor, please review the enclosed instructions. Your monitor  has already been registered assigning a specific monitor serial # to you.  Billing and Patient Assistance Program Information  We have supplied [employer] with any of your insurance information on file for billing purposes. [employer] offers a sliding scale Patient Assistance Program for patients that do not have  insurance, or whose insurance does not completely cover the cost of the ZIO monitor.  You must apply for the Patient Assistance Program to qualify for this discounted rate.  To apply, please call [employer] at [phone], select option 4, select option 2, ask to apply for  Patient Assistance Program. Sanna Justeen Hehr will ask your household income, and how many people  are in your household. They will quote your out-of-pocket cost based on that information.  [employer] will also be able to set up a 67-month, interest-free payment plan if needed.  Applying the monitor   Shave hair from upper left chest.  Hold abrader disc by orange tab. Rub abrader in 40 strokes over the upper left chest as  indicated in your monitor instructions.  Clean  area with 4 enclosed alcohol  pads. Let dry.  Apply patch as indicated in monitor instructions. Patch will be placed under collarbone on left  side of chest with arrow pointing upward.  Rub patch adhesive wings for 2 minutes. Remove white label marked "1". Remove the white  label marked "2". Rub patch adhesive wings for 2 additional minutes.  While looking in a mirror, press and release button in center of patch. A small green light will  flash 3-4 times. This will be your only indicator that the monitor has been turned on.  Do not shower for the first 24 hours. You may shower after the first 24 hours.  Press the button if you feel a symptom. You will hear a small click. Record Date, Time and  Symptom in the Patient Logbook.  When you are ready to remove the patch, follow instructions on the last 2 pages of Patient  Logbook. Stick patch monitor onto the last page of Patient Logbook.  Place Patient Logbook in the blue and white box. Use locking tab on box and tape box closed  securely. The blue and white box has prepaid postage on it. Please place it in the mailbox as  soon as possible. Your physician should have your test results approximately 7 days after the  monitor has been mailed back to [facility].  Call [facility] Customer Care at [phone] if you have questions regarding  your ZIO XT patch monitor. Call them immediately if you see an orange light blinking on your  monitor.  If your monitor falls off in less than 4 days, contact our Monitor department at [phone].  If your monitor becomes loose or falls off after 4 days call [employer] at [phone] for  suggestions on securing your monitor   Follow-Up: At [facility], you and your health needs are our priority.  As part of our continuing mission to provide you with exceptional heart care, our providers are all part of one team.  This team includes your primary Cardiologist (physician) and Advanced Practice  Providers or APPs (Physician Assistants and Nurse Practitioners) who all work together to provide you with the care you need, when you need it.  Your next appointment:   3 month(s)  Provider:   Alexandria Angel, MD OR Palmer Bobo, DNP

## 2024-05-03 ENCOUNTER — Ambulatory Visit: Attending: Emergency Medicine

## 2024-05-03 DIAGNOSIS — F339 Major depressive disorder, recurrent, unspecified: Secondary | ICD-10-CM | POA: Diagnosis not present

## 2024-05-03 DIAGNOSIS — M4807 Spinal stenosis, lumbosacral region: Secondary | ICD-10-CM | POA: Diagnosis not present

## 2024-05-03 DIAGNOSIS — R001 Bradycardia, unspecified: Secondary | ICD-10-CM

## 2024-05-03 DIAGNOSIS — M792 Neuralgia and neuritis, unspecified: Secondary | ICD-10-CM | POA: Diagnosis not present

## 2024-05-03 LAB — TSH: TSH: 1.12 u[IU]/mL (ref 0.450–4.500)

## 2024-05-03 LAB — BASIC METABOLIC PANEL WITH GFR
BUN/Creatinine Ratio: 11 — ABNORMAL LOW (ref 12–28)
BUN: 11 mg/dL (ref 8–27)
CO2: 26 mmol/L (ref 20–29)
Calcium: 9.4 mg/dL (ref 8.7–10.3)
Chloride: 103 mmol/L (ref 96–106)
Creatinine, Ser: 1.03 mg/dL — ABNORMAL HIGH (ref 0.57–1.00)
Glucose: 85 mg/dL (ref 70–99)
Potassium: 4.5 mmol/L (ref 3.5–5.2)
Sodium: 141 mmol/L (ref 134–144)
eGFR: 55 mL/min/{1.73_m2} — ABNORMAL LOW (ref >59)

## 2024-05-03 LAB — MAGNESIUM: Magnesium: 2.4 mg/dL — ABNORMAL HIGH (ref 1.6–2.3)

## 2024-05-03 MED ORDER — TORSEMIDE 40 MG PO TABS: ORAL_TABLET | ORAL | 11 refills | Status: AC

## 2024-05-03 NOTE — Progress Notes (Unsigned)
Enrolled patient for a 7 day Zio XT monitor to be mailed to patients home   [place] to read 

## 2024-05-03 NOTE — Addendum Note (Signed)
 Addended by: Gayleen Kawasaki D on: 05/03/2024 08:27 AM   Modules accepted: Orders

## 2024-05-04 ENCOUNTER — Ambulatory Visit: Payer: Self-pay | Admitting: Emergency Medicine

## 2024-05-18 DIAGNOSIS — R001 Bradycardia, unspecified: Secondary | ICD-10-CM | POA: Diagnosis not present

## 2024-05-21 ENCOUNTER — Telehealth: Payer: Self-pay | Admitting: Cardiology

## 2024-05-21 DIAGNOSIS — Z79899 Other long term (current) drug therapy: Secondary | ICD-10-CM

## 2024-05-21 NOTE — Telephone Encounter (Signed)
 Outpatient service line: Swelling  Patient reports that she has been having swelling in her lower extremities and some in her face and left arm but does not have any accompanying shortness of breath.  Very mild complaints of orthopnea.  Discussed with patient we could plan for the following:  Recommended increasing her torsemide  from 40 mg to 80 mg for the next 3 to 5 days.  Increase potassium 20 mEq twice daily to 40 mill equivalents twice daily during same period of diuresis. Instructed her to call her office to see if she has improved symptoms or not. Asked her to weigh herself, today she is 203 pounds which is actually less than what she has been and lower than her office weight earlier this month. I have scheduled appointment with [place] July 1 to assess need for further diuresis. If she does not respond to diuresis, has worsening shortness of breath or orthopnea she has been instructed to go to the emergency room for diuresis however I think given information provided we can diurese outpatient. Repeat BMP 1 week. Would call her Monday to assess symptoms and need for more urgent appointment

## 2024-05-23 ENCOUNTER — Emergency Department (HOSPITAL_COMMUNITY)
Admission: EM | Admit: 2024-05-23 | Discharge: 2024-05-24 | Disposition: A | Discharge status: Home / Self Care | Attending: Emergency Medicine | Admitting: Emergency Medicine

## 2024-05-23 ENCOUNTER — Emergency Department (HOSPITAL_COMMUNITY)

## 2024-05-23 ENCOUNTER — Ambulatory Visit: Attending: Cardiology | Admitting: Cardiology

## 2024-05-23 ENCOUNTER — Encounter: Payer: Self-pay | Admitting: Cardiology

## 2024-05-23 ENCOUNTER — Other Ambulatory Visit: Payer: Self-pay

## 2024-05-23 ENCOUNTER — Telehealth: Payer: Self-pay | Admitting: *Deleted

## 2024-05-23 ENCOUNTER — Encounter (HOSPITAL_COMMUNITY): Payer: Self-pay

## 2024-05-23 VITALS — BP 124/80 | HR 91 | Ht 59.0 in | Wt 204.2 lb

## 2024-05-23 DIAGNOSIS — R001 Bradycardia, unspecified: Secondary | ICD-10-CM | POA: Diagnosis not present

## 2024-05-23 DIAGNOSIS — Z8673 Personal history of transient ischemic attack (TIA), and cerebral infarction without residual deficits: Secondary | ICD-10-CM | POA: Diagnosis not present

## 2024-05-23 DIAGNOSIS — R531 Weakness: Secondary | ICD-10-CM | POA: Insufficient documentation

## 2024-05-23 DIAGNOSIS — R200 Anesthesia of skin: Secondary | ICD-10-CM | POA: Diagnosis not present

## 2024-05-23 DIAGNOSIS — R519 Headache, unspecified: Secondary | ICD-10-CM | POA: Diagnosis not present

## 2024-05-23 DIAGNOSIS — I5032 Chronic diastolic (congestive) heart failure: Secondary | ICD-10-CM

## 2024-05-23 DIAGNOSIS — I2510 Atherosclerotic heart disease of native coronary artery without angina pectoris: Secondary | ICD-10-CM | POA: Diagnosis not present

## 2024-05-23 DIAGNOSIS — I10 Essential (primary) hypertension: Secondary | ICD-10-CM | POA: Insufficient documentation

## 2024-05-23 DIAGNOSIS — J4489 Other specified chronic obstructive pulmonary disease: Secondary | ICD-10-CM | POA: Diagnosis not present

## 2024-05-23 DIAGNOSIS — I6782 Cerebral ischemia: Secondary | ICD-10-CM | POA: Diagnosis not present

## 2024-05-23 DIAGNOSIS — M25511 Pain in right shoulder: Secondary | ICD-10-CM | POA: Diagnosis not present

## 2024-05-23 DIAGNOSIS — R29818 Other symptoms and signs involving the nervous system: Secondary | ICD-10-CM | POA: Diagnosis not present

## 2024-05-23 DIAGNOSIS — Z87891 Personal history of nicotine dependence: Secondary | ICD-10-CM | POA: Insufficient documentation

## 2024-05-23 LAB — COMPREHENSIVE METABOLIC PANEL WITH GFR
ALT: 17 U/L (ref 0–44)
AST: 31 U/L (ref 15–41)
Albumin: 3.5 g/dL (ref 3.5–5.0)
Alkaline Phosphatase: 70 U/L (ref 38–126)
Anion gap: 12 (ref 5–15)
BUN: 19 mg/dL (ref 8–23)
CO2: 24 mmol/L (ref 22–32)
Calcium: 9.3 mg/dL (ref 8.9–10.3)
Chloride: 98 mmol/L (ref 98–111)
Creatinine, Ser: 1.35 mg/dL — ABNORMAL HIGH (ref 0.44–1.00)
GFR, Estimated: 39 mL/min — ABNORMAL LOW (ref >60)
Glucose, Bld: 90 mg/dL (ref 70–99)
Potassium: 4.7 mmol/L (ref 3.5–5.1)
Sodium: 134 mmol/L — ABNORMAL LOW (ref 135–145)
Total Bilirubin: 0.7 mg/dL (ref 0.0–1.2)
Total Protein: 7 g/dL (ref 6.5–8.1)

## 2024-05-23 LAB — CBC
HCT: 39 % (ref 36.0–46.0)
Hemoglobin: 12 g/dL (ref 12.0–15.0)
MCH: 26.6 pg (ref 26.0–34.0)
MCHC: 30.8 g/dL (ref 30.0–36.0)
MCV: 86.5 fL (ref 80.0–100.0)
Platelets: 215 10*3/uL (ref 150–400)
RBC: 4.51 MIL/uL (ref 3.87–5.11)
RDW: 13.8 % (ref 11.5–15.5)
WBC: 3.4 10*3/uL — ABNORMAL LOW (ref 4.0–10.5)
nRBC: 0 % (ref 0.0–0.2)

## 2024-05-23 LAB — DIFFERENTIAL
Abs Immature Granulocytes: 0.02 10*3/uL (ref 0.00–0.07)
Basophils Absolute: 0 10*3/uL (ref 0.0–0.1)
Basophils Relative: 1 %
Eosinophils Absolute: 0.1 10*3/uL (ref 0.0–0.5)
Eosinophils Relative: 3 %
Immature Granulocytes: 1 %
Lymphocytes Relative: 36 %
Lymphs Abs: 1.2 10*3/uL (ref 0.7–4.0)
Monocytes Absolute: 0.3 10*3/uL (ref 0.1–1.0)
Monocytes Relative: 10 %
Neutro Abs: 1.7 10*3/uL (ref 1.7–7.7)
Neutrophils Relative %: 49 %

## 2024-05-23 LAB — PROTIME-INR
INR: 1.1 (ref 0.8–1.2)
Prothrombin Time: 14 s (ref 11.4–15.2)

## 2024-05-23 LAB — CBG MONITORING, ED: Glucose-Capillary: 74 mg/dL (ref 70–99)

## 2024-05-23 LAB — ETHANOL: Alcohol, Ethyl (B): <15 mg/dL (ref <15)

## 2024-05-23 LAB — APTT: aPTT: 30 s (ref 24–36)

## 2024-05-23 MED ORDER — SODIUM CHLORIDE 0.9% FLUSH: 3.0000 mL | Freq: Once | INTRAVENOUS | Status: DC

## 2024-05-23 NOTE — Telephone Encounter (Signed)
 Patient was returning call. Please advise ?

## 2024-05-23 NOTE — Telephone Encounter (Signed)
 Patient seen in office. Denies any distress. Advised to go to Emergency Room per Dr. Kate. Patient and family verbalized understanding. Sueanne, cardimaster made aware

## 2024-05-23 NOTE — Patient Instructions (Addendum)
 Medication Instructions:  Continue current medication Please go to Emergency room as discussed with your provider *If you need a refill on your cardiac medications before your next appointment, please call your pharmacy*  Lab Work: NONE If you have labs (blood work) drawn today and your tests are completely normal, you will receive your results only by: MyChart Message (if you have MyChart) OR A paper copy in the mail If you have any lab test that is abnormal or we need to change your treatment, we will call you to review the results.  Testing/Procedures: NONE Follow-Up: At [facility], you and your health needs are our priority.  As part of our continuing mission to provide you with exceptional heart care, our providers are all part of one team.  This team includes your primary Cardiologist (physician) and Advanced Practice Providers or APPs (Physician Assistants and Nurse Practitioners) who all work together to provide you with the care you need, when you need it.  Your next appointment:   APPT SCHEDULED  Provider:   Dr. Kate  We recommend signing up for the patient portal called MyChart.  Sign up information is provided on this After Visit Summary.  MyChart is used to connect with patients for Virtual Visits (Telemedicine).  Patients are able to view lab/test results, encounter notes, upcoming appointments, etc.  Non-urgent messages can be sent to your provider as well.   To learn more about what you can do with MyChart, go to [email or web address].   Other Instructions Please call and let us  know your Torsemide  dosages Please go to Emergency Room as discussed with your provider

## 2024-05-23 NOTE — ED Provider Triage Note (Signed)
 Emergency Medicine Provider Triage Evaluation Note  [name] , a 83 y.o. female  was evaluated in triage.  Pt complains of left-sided weakness.  She was sent by her PCP for evaluation of left-sided weakness, has felt increased discomfort and numbness in the left upper and lower extremities.  Has been going on for the last 3 days, no acute changes in the last 24 hours.  No dizziness/vertigo.  No nausea or vomiting..  Review of Systems  Positive: Left-sided weakness and numbness Negative: Dizziness/vertigo, nausea/vomiting.  Physical Exam  BP 134/81 (BP Location: Right Arm)   Pulse (!) 45   Temp (!) 97.3 F (36.3 C) (Oral)   Resp 13   SpO2 100%  Gen:   Awake, no distress   Resp:  Normal effort  MSK:   Moves extremities without difficulty  Other:  Noted that strength in the upper and lower extremities has a left-sided asymmetry with 4 and 5 strength to the right lower extremity and 3 out of 5 strength in left.  Cranial nerves are intact, no other focal deficits are noted.  Medical Decision Making  Medically screening exam initiated at 7:39 PM.  Appropriate orders placed.  [name] was informed that the remainder of the evaluation will be completed by another provider, this initial triage assessment does not replace that evaluation, and the importance of remaining in the ED until their evaluation is complete.  Based on evaluation of the patient, differential does include subacute CVA, head CT has been ordered.   Myriam Dorn [place], [place] 05/23/24 [phone]

## 2024-05-23 NOTE — ED Triage Notes (Signed)
 Pt c.o left arm and leg numbness since Friday. Pt also c.o numbness in her right upper arm. Denies headache or dizziness. No weakness noted in arm, left leg is weaker than the right on exam

## 2024-05-23 NOTE — Progress Notes (Signed)
 Cardiology Office Note:    Date:  05/23/2024   ID:  [name], DOB Feb 10, 1941, MRN [id number]  PCP:  [name], MD  Cardiologist:  Redell Shallow, MD  Electrophysiologist:  None   Referring MD: [name], MD   Chief Complaint  Patient presents with   Edema    History of Present Illness:    [name] is a 83 y.o. female with a hx of CAD, COPD, chronic diastolic heart failure, dementia who presents for follow-up.  She was seen by Lum Louis, NP in clinic on 05/02/2024.  Noted to have heart rate down to 40s, no symptoms reported, ZIO monitor x 7 days was ordered.  She was felt to be euvolemic and was continued on her home torsemide  40 mg daily.  She called on 6/21 and reported worsening swelling in her lower extremities.  It was recommended she increase her torsemide  to 80 mg and she was scheduled for DOD visit today.  She reported that she started having acute numbness and weakness on left side of her body on 6/21. She reports edema has improved but continues to have numbness and weakness.     Wt Readings from Last 3 Encounters:  05/23/24 204 lb 3.2 oz (92.6 kg)  05/02/24 205 lb (93 kg)  03/17/24 207 lb 4 oz (94 kg)     Past Medical History:  Diagnosis Date   Allergic rhinitis    Arnold-Chiari malformation (HCC)    Asthma    CAD (coronary artery disease)    COPD (chronic obstructive pulmonary disease) (HCC)    Coughing    coughing since last 01-22-2019 , started on cefdinir bid x10 days, has 3 pills left today , reports she feels mcuh better , still coughing copiuus amonts of thick white sputum , denies fever nor chills, nor body aches .    DJD (degenerative joint disease)    GERD (gastroesophageal reflux disease)    Hiatal hernia    History of absence seizures    last confirmed seizure age 58     Hx of colonic polyps    Hypercholesteremia    Hypertension    Obesity    Positive PPD    Reflex sympathetic dystrophy    Sleep apnea    Varicose veins     Venous insufficiency     Past Surgical History:  Procedure Laterality Date   ABDOMINAL HYSTERECTOMY     CHOLECYSTECTOMY N/A 02/19/2023   Procedure: LAPAROSCOPIC CHOLECYSTECTOMY WITH INTRAOPERATIVE CHOLANGIOGRAM AND ICG DYE;  Surgeon: Tanda Locus, MD;  Location: [facility] OR;  Service: General;  Laterality: N/A;   cspine surgery  1993   for arnold-chiari malformation   IR BONE MARROW BIOPSY & ASPIRATION  01/28/2023   JOINT REPLACEMENT  06/05/11   Left total knee replacement   right knee arthroscopy  12/2007   Dr. Duwayne   right total knee replacement  05/2008   Dr. Duwayne   TOTAL KNEE REVISION Left 02/03/2019   Procedure: LEFT TOTAL KNEE REVISION;  Surgeon: Fidel Redell, MD;  Location: [facility];  Service: Orthopedics;  Laterality: Left;    Current Medications: Current Meds  Medication Sig   acetaminophen  (TYLENOL ) 500 MG tablet Take 1,000 mg by mouth every 6 (six) hours as needed for moderate pain.   albuterol  (PROVENTIL  HFA;VENTOLIN  HFA) 108 (90 BASE) MCG/ACT inhaler Inhale 2 puffs into the lungs every 4 (four) hours as needed. For wheeze or shortness of breath   aspirin  81 MG tablet Take 81 mg  by mouth daily.   azelastine  (ASTELIN ) 0.1 % nasal spray Place 1 spray into both nostrils 2 (two) times daily as needed for rhinitis or allergies.   budesonide -formoterol  (SYMBICORT ) 80-4.5 MCG/ACT inhaler Inhale 2 puffs into the lungs 2 (two) times daily as needed (shortness of breath).   [employer] D3 50 MCG (2000 UT) CAPS Take 1 capsule by mouth daily.   cyanocobalamin  (VITAMIN B12) 1000 MCG tablet Take 1,000 mcg by mouth daily.   donepezil  (ARICEPT ) 5 MG tablet Take 5 mg by mouth at bedtime.   EPINEPHrine  0.3 mg/0.3 mL IJ SOAJ injection Inject 0.3 mg into the muscle as needed for anaphylaxis.   famotidine  (PEPCID ) 20 MG tablet TAKE ONE TABLET BY MOUTH TWICE DAILY   ferrous sulfate  325 (65 FE) MG EC tablet Take 325 mg by mouth daily.   fluticasone  (FLONASE ) 50 MCG/ACT nasal spray Place 1 spray into both  nostrils 2 (two) times daily as needed for allergies or rhinitis.   lidocaine  (LIDODERM ) 5 % Place 1 patch onto the skin daily. Remove & Discard patch within 12 hours or as directed by MD (Patient taking differently: Place 1 patch onto the skin as needed (pain). Remove & Discard patch within 12 hours or as directed by MD)   Torsemide  40 MG TABS Take 1 tablet (40 mg) by mouth daily every morning (Patient taking differently: Per patient taking 4 tablets daily.)   traZODone  (DESYREL ) 100 MG tablet TAKE ONE TABLET BY MOUTH EVERYDAY AT BEDTIME     Allergies:   Patient has no known allergies.   Social History   Socioeconomic History   Marital status: Widowed    Spouse name: Not on file   Number of children: 2   Years of education: 109   Highest education level: Not on file  Occupational History   Occupation: retired  Tobacco Use   Smoking status: Former    Current packs/day: 0.00    Average packs/day: 0.3 packs/day for 12.0 years (3.6 ttl pk-yrs)    Types: Cigarettes    Start date: 01/02/2000    Quit date: 01/02/2012    Years since quitting: 12.3   Smokeless tobacco: Never   Tobacco comments:    1 pack per week  Vaping Use   Vaping status: Never Used  Substance and Sexual Activity   Alcohol  use: No   Drug use: No   Sexual activity: Not Currently  Other Topics Concern   Not on file  Social History Narrative   Fun/Hobby: Playing cards and mingling.   Denies abuse and feels safe at home.    Social Drivers of Corporate investment banker Strain: Not on file  Food Insecurity: Not on file  Transportation Needs: Not on file  Physical Activity: Not on file  Stress: Not on file  Social Connections: Not on file     Family History: The patient's family history includes Clotting disorder in her father; Heart disease in her mother; Stroke in her father and mother. There is no history of Colon cancer or Stomach cancer.  ROS:   Please see the history of present illness.     All other  systems reviewed and are negative.  EKGs/Labs/Other Studies Reviewed:    The following studies were reviewed today:   EKG:  EKG is not ordered today.    Recent Labs: 11/11/2023: B Natriuretic Peptide 20.2; Hemoglobin 12.5; Platelets 192 05/02/2024: BUN 11; Creatinine, Ser 1.03; Magnesium 2.4; Potassium 4.5; Sodium 141; TSH 1.120  Recent Lipid Panel  Component Value Date/Time   CHOL 151 06/18/2022 1143   CHOL 144 11/07/2019 1605   TRIG 74.0 06/18/2022 1143   HDL 58.30 06/18/2022 1143   HDL 68 11/07/2019 1605   CHOLHDL 3 06/18/2022 1143   VLDL 14.8 06/18/2022 1143   LDLCALC 78 06/18/2022 1143   LDLCALC 57 11/07/2019 1605    Physical Exam:    VS:  BP 124/80   Pulse 91   Ht 4' 11 (1.499 m)   Wt 204 lb 3.2 oz (92.6 kg)   SpO2 97%   BMI 41.24 kg/m     Wt Readings from Last 3 Encounters:  05/23/24 204 lb 3.2 oz (92.6 kg)  05/02/24 205 lb (93 kg)  03/17/24 207 lb 4 oz (94 kg)     GEN:  Well nourished, well developed in no acute distress HEENT: Normal NECK: No JVD; No carotid bruits LYMPHATICS: No lymphadenopathy CARDIAC: RRR, no murmurs, rubs, gallops RESPIRATORY:  Clear to auscultation without rales, wheezing or rhonchi  ABDOMEN: Soft, non-tender, non-distended MUSCULOSKELETAL:  Trace edema; No deformity  SKIN: Warm and dry NEUROLOGIC:  Alert and oriented x 3 PSYCHIATRIC:  Normal affect   ASSESSMENT:    1. Numbness   2. Weakness   3. Chronic heart failure with preserved ejection fraction (HCC)   4. Bradycardia   5. Coronary artery disease involving native coronary artery of native heart without angina pectoris    PLAN:    Numbness/weakness: She reported acute onset of left-sided numbness/weakness on 6/21.  She did not seek medical attention at that time.  Symptoms have persisted.  Recommend evaluation in ED to exclude CVA; she initially declined but eventually agreed to ED evaluation  Chronic diastolic heart failure: Echocardiogram 12/2023 showed EF 55 to  60%, normal RV function, no significant valvular abnormalities.  Reports worsening lower extremity edema recently - She is on torsemide  40 mg daily and potassium 20 mEq twice daily, reports over the last 2 days she doubled her dose of torsemide .  She does not appear volume overloaded on exam, has trace lower extremity edema, no JVD, and lungs are CTAB.  Would resume torsemide  40 mg daily and potassium 20 mEq twice daily  Bradycardia: Asymptomatic sinus bradycardia to 40s recently.  Zio patch x 7 days was ordered at clinic appointment earlier this month, which showed average heart rate 55 bpm, sinus bradycardia down to 36, no significant arrhythmias.  CAD: Nonobstructive CAD on LHC in 2005.  Low risk NST in 2016 - Continue aspirin , statin   Medication Adjustments/Labs and Tests Ordered: Current medicines are reviewed at length with the patient today.  Concerns regarding medicines are outlined above.  No orders of the defined types were placed in this encounter.  No orders of the defined types were placed in this encounter.   Patient Instructions  Medication Instructions:  Continue current medication Please go to Emergency room as discussed with your provider *If you need a refill on your cardiac medications before your next appointment, please call your pharmacy*  Lab Work: NONE If you have labs (blood work) drawn today and your tests are completely normal, you will receive your results only by: MyChart Message (if you have MyChart) OR A paper copy in the mail If you have any lab test that is abnormal or we need to change your treatment, we will call you to review the results.  Testing/Procedures: NONE Follow-Up: At [facility], you and your health needs are our priority.  As part of our continuing  mission to provide you with exceptional heart care, our providers are all part of one team.  This team includes your primary Cardiologist (physician) and Advanced Practice  Providers or APPs (Physician Assistants and Nurse Practitioners) who all work together to provide you with the care you need, when you need it.  Your next appointment:   APPT SCHEDULED  Provider:   Dr. Kate  We recommend signing up for the patient portal called MyChart.  Sign up information is provided on this After Visit Summary.  MyChart is used to connect with patients for Virtual Visits (Telemedicine).  Patients are able to view lab/test results, encounter notes, upcoming appointments, etc.  Non-urgent messages can be sent to your provider as well.   To learn more about what you can do with MyChart, go to [email or web address].   Other Instructions Please call and let us  know your Torsemide  dosages Please go to Emergency Room as discussed with your provider    Signed, Lonni LITTIE Kate, MD  05/23/2024 3:38 PM    [facility]

## 2024-05-23 NOTE — Telephone Encounter (Signed)
 Spoke with pt who continues to complain of edema of face, arm and legs.  Pt is taking medications as prescribed.  Weight today is 202.  Denies current CP or SOB.  Pt requesting to be seen as she is so uncomfortable and does not want to wait until 05/31/24 to be seen as scheduled.  Appointment scheduled with Dr Kate, DOD today 05/23/24 at 240pm.  Pt verbalizes understanding and agrees with current plan.

## 2024-05-23 NOTE — Telephone Encounter (Signed)
 Attempted phone call to pt and left voicemail message to contact [facility] at [phone].

## 2024-05-24 ENCOUNTER — Other Ambulatory Visit: Payer: Self-pay

## 2024-05-24 ENCOUNTER — Emergency Department (HOSPITAL_COMMUNITY)

## 2024-05-24 DIAGNOSIS — I6782 Cerebral ischemia: Secondary | ICD-10-CM | POA: Diagnosis not present

## 2024-05-24 DIAGNOSIS — Z8673 Personal history of transient ischemic attack (TIA), and cerebral infarction without residual deficits: Secondary | ICD-10-CM | POA: Diagnosis not present

## 2024-05-24 LAB — TROPONIN I (HIGH SENSITIVITY): Troponin I (High Sensitivity): 8 ng/L (ref <18)

## 2024-05-24 LAB — LIPASE, BLOOD: Lipase: 27 U/L (ref 11–51)

## 2024-05-24 MED ORDER — LORAZEPAM 2 MG/ML IJ SOLN: 2.0000 mg | Freq: Once | INTRAMUSCULAR | Status: DC | PRN

## 2024-05-24 MED ORDER — ACETAMINOPHEN 500 MG PO TABS
1000.0000 mg | ORAL_TABLET | Freq: Once | ORAL | Status: AC
  Administered 2024-05-24: 1000 mg via ORAL
  Filled 2024-05-24: qty 2

## 2024-05-24 MED ORDER — LORAZEPAM 1 MG PO TABS
1.0000 mg | ORAL_TABLET | Freq: Once | ORAL | Status: AC
  Administered 2024-05-24: 1 mg via ORAL
  Filled 2024-05-24: qty 1

## 2024-05-24 NOTE — ED Provider Notes (Signed)
 [facility] Emergency Department Provider Note MRN:  [id number]  Arrival date & time: 05/24/24     Chief Complaint   Numbness   History of Present Illness   [name] is a 83 y.o. year-old female with a history of CAD, COPD presenting to the ED with chief complaint of numbness.  Left-sided decreased strength, leg greater than arm for the past 3 to 4 days.  Associated with paresthesias to the left side of the body.  Also endorses intermittent chest pain but not currently, intermittent right shoulder pain but not currently, intermittent headache, intermittent abdominal pain all these aches and pains more chronic in nature.  Review of Systems  A thorough review of systems was obtained and all systems are negative except as noted in the HPI and PMH.   Patient's Health History    Past Medical History:  Diagnosis Date   Allergic rhinitis    Arnold-Chiari malformation (HCC)    Asthma    CAD (coronary artery disease)    COPD (chronic obstructive pulmonary disease) (HCC)    Coughing    coughing since last 01-22-2019 , started on cefdinir bid x10 days, has 3 pills left today , reports she feels mcuh better , still coughing copiuus amonts of thick white sputum , denies fever nor chills, nor body aches .    DJD (degenerative joint disease)    GERD (gastroesophageal reflux disease)    Hiatal hernia    History of absence seizures    last confirmed seizure age 67     Hx of colonic polyps    Hypercholesteremia    Hypertension    Obesity    Positive PPD    Reflex sympathetic dystrophy    Sleep apnea    Varicose veins    Venous insufficiency     Past Surgical History:  Procedure Laterality Date   ABDOMINAL HYSTERECTOMY     CHOLECYSTECTOMY N/A 02/19/2023   Procedure: LAPAROSCOPIC CHOLECYSTECTOMY WITH INTRAOPERATIVE CHOLANGIOGRAM AND ICG DYE;  Surgeon: Tanda Locus, MD;  Location: [facility] OR;  Service: General;  Laterality: N/A;   cspine surgery  1993   for  arnold-chiari malformation   IR BONE MARROW BIOPSY & ASPIRATION  01/28/2023   JOINT REPLACEMENT  06/05/11   Left total knee replacement   right knee arthroscopy  12/2007   Dr. Duwayne   right total knee replacement  05/2008   Dr. Duwayne   TOTAL KNEE REVISION Left 02/03/2019   Procedure: LEFT TOTAL KNEE REVISION;  Surgeon: Fidel Rogue, MD;  Location: [facility];  Service: Orthopedics;  Laterality: Left;    Family History  Problem Relation Age of Onset   Heart disease Mother    Stroke Mother    Stroke Father    Clotting disorder Father    Colon cancer Neg Hx    Stomach cancer Neg Hx     Social History   Socioeconomic History   Marital status: Widowed    Spouse name: Not on file   Number of children: 2   Years of education: 101   Highest education level: Not on file  Occupational History   Occupation: retired  Tobacco Use   Smoking status: Former    Current packs/day: 0.00    Average packs/day: 0.3 packs/day for 12.0 years (3.6 ttl pk-yrs)    Types: Cigarettes    Start date: 01/02/2000    Quit date: 01/02/2012    Years since quitting: 12.4   Smokeless tobacco: Never   Tobacco comments:  1 pack per week  Vaping Use   Vaping status: Never Used  Substance and Sexual Activity   Alcohol  use: No   Drug use: No   Sexual activity: Not Currently  Other Topics Concern   Not on file  Social History Narrative   Fun/Hobby: Playing cards and mingling.   Denies abuse and feels safe at home.    Social Drivers of Corporate investment banker Strain: Not on file  Food Insecurity: Not on file  Transportation Needs: Not on file  Physical Activity: Not on file  Stress: Not on file  Social Connections: Not on file  Intimate Partner Violence: Not on file     Physical Exam   Vitals:   05/24/24 0244 05/24/24 0600  BP: 130/81 (!) 161/84  Pulse: (!) 53   Resp: 16 13  Temp: 97.7 F (36.5 C)   SpO2: 99%     CONSTITUTIONAL: Well-appearing, NAD NEURO/PSYCH: Decreased strength to the left  leg EYES:  eyes equal and reactive ENT/NECK:  no LAD, no JVD CARDIO: Regular rate, well-perfused, normal S1 and S2 PULM:  CTAB no wheezing or rhonchi GI/GU:  non-distended, non-tender MSK/SPINE:  No gross deformities, no edema SKIN:  no rash, atraumatic   *Additional and/or pertinent findings included in MDM below  Diagnostic and Interventional Summary    EKG Interpretation Date/Time:    Ventricular Rate:    PR Interval:    QRS Duration:    QT Interval:    QTC Calculation:   R Axis:      Text Interpretation:         Labs Reviewed  CBC - Abnormal; Notable for the following components:      Result Value   WBC 3.4 (*)    All other components within normal limits  COMPREHENSIVE METABOLIC PANEL WITH GFR - Abnormal; Notable for the following components:   Sodium 134 (*)    Creatinine, Ser 1.35 (*)    GFR, Estimated 39 (*)    All other components within normal limits  PROTIME-INR  APTT  DIFFERENTIAL  ETHANOL  LIPASE, BLOOD  CBG MONITORING, ED  TROPONIN I (HIGH SENSITIVITY)  TROPONIN I (HIGH SENSITIVITY)    CT HEAD WO CONTRAST  Final Result    MR BRAIN WO CONTRAST    (Results Pending)    Medications  sodium chloride  flush (NS) 0.9 % injection 3 mL (has no administration in time range)  LORazepam  (ATIVAN ) injection 2 mg (has no administration in time range)     Procedures  /  Critical Care Procedures  ED Course and Medical Decision Making  Initial Impression and Ddx History would be concerning for possible stroke.  Endorsing symmetric sensation on exam, more of a paresthesia to the left side.  Most prominent neurological deficit that is objective would be left leg weakness.  Will need MRI to evaluate for stroke.  Also with numerous aches and pains of unclear significance and chronicity.  Past medical/surgical history that increases complexity of ED encounter: CAD, COPD  Interpretation of Diagnostics I personally reviewed the EKG and my interpretation is as  follows: Sinus rhythm  No significant blood count or electrolyte disturbance.  Patient Reassessment and Ultimate Disposition/Management     Awaiting MRI.  Signed out to oncoming provider at shift change.  Patient management required discussion with the following services or consulting groups:  None  Complexity of Problems Addressed Acute illness or injury that poses threat of life of bodily function  Additional Data Reviewed and  Analyzed Further history obtained from: Further history from spouse/family member  Additional Factors Impacting ED Encounter Risk Consideration of hospitalization  Ozell HERO. Theadore, MD [facility] Emergency [facility] [email or web address]  Final Clinical Impressions(s) / ED Diagnoses     ICD-10-CM   1. Weakness  R53.1       ED Discharge Orders     None        Discharge Instructions Discussed with and Provided to Patient:   Discharge Instructions   None      Theadore Ozell HERO, MD 05/24/24 [phone]

## 2024-05-24 NOTE — ED Notes (Signed)
 Patient transported to MRI

## 2024-05-24 NOTE — ED Notes (Signed)
 This nurse assumed care of this pt at this time.

## 2024-05-24 NOTE — Discharge Instructions (Addendum)
 You were seen in the emergency department for your weakness.  Your workup showed no signs of stroke or abnormalities with your lab's.  Your weakness may be related to your arthritis.  You should follow-up with your primary doctor in the next few days to have your symptoms rechecked and we have given you a referral to neurology for further evaluation.  You should return to the emergency department if you are having worsening weakness and are unable to walk, you are unable to urinate, the whole left side of your body is weak or numb or if you have any other new or concerning symptoms.

## 2024-05-24 NOTE — ED Provider Notes (Signed)
 Patient signed out to me at 0700 by Dr. Theadore pending MRI.  In short this is an 83 year old female with past medical history of CAD COPD, degenerative disc disease that presented to the emergency department with left-sided numbness and weakness for the last 3 to 4 days.  She states that she does use a walker at baseline.  She was hemodynamically stable in no acute distress.  She had labs and head CT performed overnight that are within normal range.  She is pending MRI at this time.  On my evaluation she is resting in bed comfortably no acute distress.  She has normal sensation throughout.  There is a slight decrease grip strength on the left upper extremity, no drift in bilateral upper extremities, mild drift in left lower extremity.  Clinical Course as of 05/24/24 1040  Tue May 24, 2024  1021 No acute abnormality on MRI. Suspect weakness may be in the setting of patient's arthritis. She is stable for discharge home. Recommended primary and neurology follow up. [VK]    Clinical Course User Index [VK] Kingsley, Mignon Bechler K, DO      Kingsley, Kobie Whidby K, [place] 05/24/24 1040

## 2024-05-25 ENCOUNTER — Ambulatory Visit (INDEPENDENT_AMBULATORY_CARE_PROVIDER_SITE_OTHER): Admitting: Podiatry

## 2024-05-25 DIAGNOSIS — M722 Plantar fascial fibromatosis: Secondary | ICD-10-CM

## 2024-05-25 DIAGNOSIS — M62462 Contracture of muscle, left lower leg: Secondary | ICD-10-CM | POA: Diagnosis not present

## 2024-05-25 NOTE — Progress Notes (Unsigned)
 Subjective:  Patient ID: [name], female    DOB: Oct 15, 1941,  MRN: [id number]  Chief Complaint  Patient presents with   Foot Pain    Left heel pain    83 y.o. female presents with the above complaint.  Patient presents with left heel pain that has been going on for quite some time is progressive gotten worse worse with ambulation is with pressure she would like to discuss treatment options.  Pain scale 7 out of 10 dull aching nature she has not seen anyone else prior to seeing me for this.   Review of Systems: Negative except as noted in the HPI. Denies N/V/F/Ch.  Past Medical History:  Diagnosis Date   Allergic rhinitis    Arnold-Chiari malformation (HCC)    Asthma    CAD (coronary artery disease)    COPD (chronic obstructive pulmonary disease) (HCC)    Coughing    coughing since last 01-22-2019 , started on cefdinir bid x10 days, has 3 pills left today , reports she feels mcuh better , still coughing copiuus amonts of thick white sputum , denies fever nor chills, nor body aches .    DJD (degenerative joint disease)    GERD (gastroesophageal reflux disease)    Hiatal hernia    History of absence seizures    last confirmed seizure age 41     Hx of colonic polyps    Hypercholesteremia    Hypertension    Obesity    Positive PPD    Reflex sympathetic dystrophy    Sleep apnea    Varicose veins    Venous insufficiency     Current Outpatient Medications:    acetaminophen  (TYLENOL ) 500 MG tablet, Take 1,000 mg by mouth every 6 (six) hours as needed for moderate pain., Disp: , Rfl:    albuterol  (PROVENTIL  HFA;VENTOLIN  HFA) 108 (90 BASE) MCG/ACT inhaler, Inhale 2 puffs into the lungs every 4 (four) hours as needed. For wheeze or shortness of breath, Disp: 1 Inhaler, Rfl: 6   alendronate  (FOSAMAX ) 70 MG tablet, Take 70 mg by mouth once a week., Disp: , Rfl:    aspirin  81 MG tablet, Take 81 mg by mouth daily., Disp: , Rfl:    azelastine  (ASTELIN ) 0.1 % nasal spray, Place 1  spray into both nostrils 2 (two) times daily as needed for rhinitis or allergies., Disp: , Rfl:    budesonide -formoterol  (SYMBICORT ) 80-4.5 MCG/ACT inhaler, Inhale 2 puffs into the lungs 2 (two) times daily as needed (shortness of breath)., Disp: , Rfl:    CVS D3 50 MCG (2000 UT) CAPS, Take 1 capsule by mouth daily., Disp: , Rfl:    cyanocobalamin  (VITAMIN B12) 1000 MCG tablet, Take 1,000 mcg by mouth daily., Disp: , Rfl:    donepezil  (ARICEPT  ODT) 5 MG disintegrating tablet, Take 1 tablet (5 mg total) by mouth at bedtime., Disp: 90 tablet, Rfl: 3   donepezil  (ARICEPT ) 5 MG tablet, Take 5 mg by mouth at bedtime., Disp: , Rfl:    EPINEPHrine  0.3 mg/0.3 mL IJ SOAJ injection, Inject 0.3 mg into the muscle as needed for anaphylaxis., Disp: , Rfl:    famotidine  (PEPCID ) 20 MG tablet, TAKE ONE TABLET BY MOUTH TWICE DAILY, Disp: 60 tablet, Rfl: 1   ferrous sulfate  325 (65 FE) MG EC tablet, Take 325 mg by mouth daily., Disp: , Rfl:    fluticasone  (FLONASE ) 50 MCG/ACT nasal spray, Place 1 spray into both nostrils 2 (two) times daily as needed for allergies or rhinitis., Disp: ,  Rfl:    lidocaine  (LIDODERM ) 5 %, Place 1 patch onto the skin daily. Remove & Discard patch within 12 hours or as directed by MD (Patient taking differently: Place 1 patch onto the skin as needed (pain). Remove & Discard patch within 12 hours or as directed by MD), Disp: 7 patch, Rfl: 0   loperamide (IMODIUM A-D) 2 MG tablet, Take 2 mg by mouth as needed for diarrhea or loose stools., Disp: , Rfl:    lubiprostone  (AMITIZA ) 8 MCG capsule, TAKE 1 CAPSULE (8 MCG TOTAL) BY MOUTH 2 (TWO) TIMES DAILY WITH A MEAL., Disp: 60 capsule, Rfl: 3   meloxicam  (MOBIC ) 15 MG tablet, TAKE ONE TABLET BY MOUTH ONCE daily AS NEEDED FOR PAIN, Disp: 30 tablet, Rfl: 2   montelukast  (SINGULAIR ) 10 MG tablet, TAKE 1 TABLET BY MOUTH EVERY DAY IN THE EVENING, Disp: 90 tablet, Rfl: 1   mupirocin  cream (BACTROBAN ) 2 %, Apply 1 Application topically 2 (two) times  daily., Disp: 15 g, Rfl: 0   pantoprazole  (PROTONIX ) 40 MG tablet, TAKE 1 TABLET BY MOUTH EVERY DAY, Disp: 90 tablet, Rfl: 1   potassium chloride  SA (KLOR-CON  M20) 20 MEQ tablet, Take 1 tablet (20 mEq total) by mouth 2 (two) times daily., Disp: 180 tablet, Rfl: 3   rosuvastatin  (CRESTOR ) 40 MG tablet, Take 1 tablet (40 mg total) by mouth daily., Disp: 90 tablet, Rfl: 3   Tiotropium Bromide Monohydrate (SPIRIVA RESPIMAT) 1.25 MCG/ACT AERS, Inhale 1.25 mcg into the lungs daily., Disp: , Rfl:    tiZANidine  (ZANAFLEX ) 2 MG tablet, Take 1 tablet (2 mg total) by mouth every 6 (six) hours as needed for muscle spasms., Disp: 40 tablet, Rfl: 1   Torsemide  40 MG TABS, Take 1 tablet (40 mg) by mouth daily every morning (Patient taking differently: Per patient taking 4 tablets daily.), Disp: 30 tablet, Rfl: 11   traZODone  (DESYREL ) 100 MG tablet, TAKE ONE TABLET BY MOUTH EVERYDAY AT BEDTIME, Disp: 90 tablet, Rfl: 2 No current facility-administered medications for this visit.  Facility-Administered Medications Ordered in Other Visits:    Spy Agent Green / Firefly Optime, 1.25 mg, Intravenous, Once, Tanda Locus, MD  Social History   Tobacco Use  Smoking Status Former   Current packs/day: 0.00   Average packs/day: 0.3 packs/day for 12.0 years (3.6 ttl pk-yrs)   Types: Cigarettes   Start date: 01/02/2000   Quit date: 01/02/2012   Years since quitting: 12.4  Smokeless Tobacco Never  Tobacco Comments   1 pack per week    No Known Allergies Objective:  There were no vitals filed for this visit. There is no height or weight on file to calculate BMI. Constitutional Well developed. Well nourished.  Vascular Dorsalis pedis pulses palpable bilaterally. Posterior tibial pulses palpable bilaterally. Capillary refill normal to all digits.  No cyanosis or clubbing noted. Pedal hair growth normal.  Neurologic Normal speech. Oriented to person, place, and time. Epicritic sensation to light touch grossly  present bilaterally.  Dermatologic Nails well groomed and normal in appearance. No open wounds. No skin lesions.  Orthopedic: Normal joint ROM without pain or crepitus bilaterally. No visible deformities. Tender to palpation at the calcaneal tuber left. No pain with calcaneal squeeze left. Ankle ROM diminished range of motion left. Silfverskiold Test: positive left.   Radiographs: None  Assessment:   1. Plantar fasciitis of left foot   2. Gastrocnemius equinus of left lower extremity    Plan:  Patient was evaluated and treated and all questions answered.  Plantar Fasciitis, left with underlying gastrocnemius equinus - XR reviewed as above.  - Educated on icing and stretching. Instructions given.  - Injection delivered to the plantar fascia as below. - DME: Plantar fascial brace dispensed to support the medial longitudinal arch of the foot and offload pressure from the heel and prevent arch collapse during weightbearing - Pharmacologic management: None  Procedure: Injection Tendon/Ligament Location: Left plantar fascia at the glabrous junction; medial approach. Skin Prep: alcohol  Injectate: 0.5 cc 0.5% marcaine  plain, 0.5 cc of 1% Lidocaine , 0.5 cc kenalog  10. Disposition: Patient tolerated procedure well. Injection site dressed with a band-aid.  No follow-ups on file.

## 2024-05-30 DIAGNOSIS — M810 Age-related osteoporosis without current pathological fracture: Secondary | ICD-10-CM | POA: Diagnosis not present

## 2024-05-30 DIAGNOSIS — F339 Major depressive disorder, recurrent, unspecified: Secondary | ICD-10-CM | POA: Diagnosis not present

## 2024-05-30 DIAGNOSIS — I2510 Atherosclerotic heart disease of native coronary artery without angina pectoris: Secondary | ICD-10-CM | POA: Diagnosis not present

## 2024-05-31 ENCOUNTER — Ambulatory Visit: Admitting: Emergency Medicine

## 2024-06-02 DIAGNOSIS — M5416 Radiculopathy, lumbar region: Secondary | ICD-10-CM | POA: Diagnosis not present

## 2024-06-07 DIAGNOSIS — R413 Other amnesia: Secondary | ICD-10-CM | POA: Diagnosis not present

## 2024-06-07 DIAGNOSIS — R251 Tremor, unspecified: Secondary | ICD-10-CM | POA: Diagnosis not present

## 2024-06-07 DIAGNOSIS — F339 Major depressive disorder, recurrent, unspecified: Secondary | ICD-10-CM | POA: Diagnosis not present

## 2024-06-07 DIAGNOSIS — I10 Essential (primary) hypertension: Secondary | ICD-10-CM | POA: Diagnosis not present

## 2024-06-07 DIAGNOSIS — R252 Cramp and spasm: Secondary | ICD-10-CM | POA: Diagnosis not present

## 2024-06-07 DIAGNOSIS — E7800 Pure hypercholesterolemia, unspecified: Secondary | ICD-10-CM | POA: Diagnosis not present

## 2024-06-07 DIAGNOSIS — E538 Deficiency of other specified B group vitamins: Secondary | ICD-10-CM | POA: Diagnosis not present

## 2024-06-07 DIAGNOSIS — M810 Age-related osteoporosis without current pathological fracture: Secondary | ICD-10-CM | POA: Diagnosis not present

## 2024-06-07 DIAGNOSIS — I509 Heart failure, unspecified: Secondary | ICD-10-CM | POA: Diagnosis not present

## 2024-06-07 DIAGNOSIS — I2510 Atherosclerotic heart disease of native coronary artery without angina pectoris: Secondary | ICD-10-CM | POA: Diagnosis not present

## 2024-06-07 DIAGNOSIS — M4807 Spinal stenosis, lumbosacral region: Secondary | ICD-10-CM | POA: Diagnosis not present

## 2024-06-13 DIAGNOSIS — K219 Gastro-esophageal reflux disease without esophagitis: Secondary | ICD-10-CM | POA: Diagnosis not present

## 2024-06-13 DIAGNOSIS — J449 Chronic obstructive pulmonary disease, unspecified: Secondary | ICD-10-CM | POA: Diagnosis not present

## 2024-06-13 DIAGNOSIS — R2681 Unsteadiness on feet: Secondary | ICD-10-CM | POA: Diagnosis not present

## 2024-06-13 DIAGNOSIS — F0390 Unspecified dementia without behavioral disturbance: Secondary | ICD-10-CM | POA: Diagnosis not present

## 2024-06-13 DIAGNOSIS — Z008 Encounter for other general examination: Secondary | ICD-10-CM | POA: Diagnosis not present

## 2024-06-13 DIAGNOSIS — N1832 Chronic kidney disease, stage 3b: Secondary | ICD-10-CM | POA: Diagnosis not present

## 2024-06-13 DIAGNOSIS — F17211 Nicotine dependence, cigarettes, in remission: Secondary | ICD-10-CM | POA: Diagnosis not present

## 2024-06-13 DIAGNOSIS — Z6833 Body mass index (BMI) 33.0-33.9, adult: Secondary | ICD-10-CM | POA: Diagnosis not present

## 2024-06-13 DIAGNOSIS — M810 Age-related osteoporosis without current pathological fracture: Secondary | ICD-10-CM | POA: Diagnosis not present

## 2024-06-13 DIAGNOSIS — E669 Obesity, unspecified: Secondary | ICD-10-CM | POA: Diagnosis not present

## 2024-06-16 ENCOUNTER — Encounter: Payer: Self-pay | Admitting: Nurse Practitioner

## 2024-06-16 ENCOUNTER — Ambulatory Visit (INDEPENDENT_AMBULATORY_CARE_PROVIDER_SITE_OTHER): Admitting: Nurse Practitioner

## 2024-06-16 ENCOUNTER — Other Ambulatory Visit

## 2024-06-16 VITALS — BP 110/62 | HR 44 | Ht 59.0 in | Wt 200.0 lb

## 2024-06-16 DIAGNOSIS — K5900 Constipation, unspecified: Secondary | ICD-10-CM

## 2024-06-16 DIAGNOSIS — R1032 Left lower quadrant pain: Secondary | ICD-10-CM

## 2024-06-16 LAB — CBC WITH DIFFERENTIAL/PLATELET
Basophils Absolute: 0 K/uL (ref 0.0–0.1)
Basophils Relative: 0.6 % (ref 0.0–3.0)
Eosinophils Absolute: 0 K/uL (ref 0.0–0.7)
Eosinophils Relative: 1.1 % (ref 0.0–5.0)
HCT: 38.3 % (ref 36.0–46.0)
Hemoglobin: 12.2 g/dL (ref 12.0–15.0)
Lymphocytes Relative: 17.9 % (ref 12.0–46.0)
Lymphs Abs: 0.7 K/uL (ref 0.7–4.0)
MCHC: 31.9 g/dL (ref 30.0–36.0)
MCV: 82.4 fl (ref 78.0–100.0)
Monocytes Absolute: 0.3 K/uL (ref 0.1–1.0)
Monocytes Relative: 7.1 % (ref 3.0–12.0)
Neutro Abs: 2.9 K/uL (ref 1.4–7.7)
Neutrophils Relative %: 73.3 % (ref 43.0–77.0)
Platelets: 173 K/uL (ref 150.0–400.0)
RBC: 4.65 Mil/uL (ref 3.87–5.11)
RDW: 14.3 % (ref 11.5–15.5)
WBC: 3.9 K/uL — ABNORMAL LOW (ref 4.0–10.5)

## 2024-06-16 LAB — COMPREHENSIVE METABOLIC PANEL WITH GFR
ALT: 12 U/L (ref 0–35)
AST: 19 U/L (ref 0–37)
Albumin: 3.9 g/dL (ref 3.5–5.2)
Alkaline Phosphatase: 79 U/L (ref 39–117)
BUN: 15 mg/dL (ref 6–23)
CO2: 30 meq/L (ref 19–32)
Calcium: 9.6 mg/dL (ref 8.4–10.5)
Chloride: 99 meq/L (ref 96–112)
Creatinine, Ser: 0.94 mg/dL (ref 0.40–1.20)
GFR: 56.95 mL/min — ABNORMAL LOW (ref >60.00)
Glucose, Bld: 101 mg/dL — ABNORMAL HIGH (ref 70–99)
Potassium: 3.5 meq/L (ref 3.5–5.1)
Sodium: 138 meq/L (ref 135–145)
Total Bilirubin: 0.4 mg/dL (ref 0.2–1.2)
Total Protein: 7.1 g/dL (ref 6.0–8.3)

## 2024-06-16 LAB — C-REACTIVE PROTEIN: CRP: <1 mg/dL (ref 0.5–20.0)

## 2024-06-16 LAB — SEDIMENTATION RATE: Sed Rate: 33 mm/h — ABNORMAL HIGH (ref 0–30)

## 2024-06-16 NOTE — Patient Instructions (Signed)
 You have been scheduled for a CT scan of the abdomen and pelvis at [facility], 1st floor Radiology. You are scheduled on 06/23/24 arrive at 2pm to drink the oral contrast. Your Scan will start at 4 pm.s 1) Do not eat anything after 12pm (4 hours prior to your test)   You may take any medications as prescribed with a small amount of water , if necessary. If you take any of the following medications: METFORMIN, GLUCOPHAGE, GLUCOVANCE, AVANDAMET, RIOMET, FORTAMET, ACTOPLUS MET, JANUMET, GLUMETZA or METAGLIP, you MAY be asked to HOLD this medication 48 hours AFTER the exam.   The purpose of you drinking the oral contrast is to aid in the visualization of your intestinal tract. The contrast solution may cause some diarrhea. Depending on your individual set of symptoms, you may also receive an intravenous injection of x-ray contrast/dye. Plan on being at [facility] for 45 minutes or longer, depending on the type of exam you are having performed.   If you have any questions regarding your exam or if you need to reschedule, you may call [facility] at [phone] between the hours of 8:00 am and 5:00 pm, Monday-Friday.   Try Ibgard samples.  Continue miralax    Hold Benefiber until after CT results have been reviewed.  _______________________________________________________  If your blood pressure at your visit was 140/90 or greater, please contact your primary care physician to follow up on this.  _______________________________________________________  If you are age 25 or older, your body mass index should be between 23-30. Your Body mass index is 40.4 kg/m. If this is out of the aforementioned range listed, please consider follow up with your Primary Care Provider.  If you are age 71 or younger, your body mass index should be between 19-25. Your Body mass index is 40.4 kg/m. If this is out of the aformentioned range listed, please consider follow up with your Primary Care  Provider.   ________________________________________________________  The [facility] providers would like to encourage you to use MYCHART to communicate with providers for non-urgent requests or questions.  Due to long hold times on the telephone, sending your provider a message by [facility] may be a faster and more efficient way to get a response.  Please allow 48 business hours for a response.  Please remember that this is for non-urgent requests.  _______________________________________________________   Thank you for trusting me with your gastrointestinal care!   Elida Shawl, CRNP

## 2024-06-16 NOTE — Progress Notes (Signed)
 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 Past cholecystectomy and hysterectomy.  Previously followed by Dr. Aneita then recently assigned to Dr. Albertus. She was last seen by Con Blower, PA-C 03/17/2024 for further evaluation regarding lower abdominal and back pain. She was prescribed Linzess  72 mcg daily and she was instructed to increase water  and fiber intake. Prior CT 12/2022 was reviewed which showed moderate stool burden in the colon without acute abdominal/pelvic pathology to explain her symptoms. It was also noted she had mild intra/extrahepatic biliary ductal dilatation. Normal LFTs 05/23/2024.  She presents today for further GI follow-up. She is accompanied by her 2 cousins. She stated her symptoms are about the same since her last office visit 03/17/2024 as noted above. She stated her bowel pattern varies, she passes solid, loose or watery stools every 2 to 3 days. Stools are black since she started taking oral iron several years ago. Linzess  was ineffective. She takes Dulcolax 1 or 2 tabs every 2 weeks. She endorses having persistent lower abdominal pain, more to the LLQ which has occurred daily for the past 2 months and is somewhat worsened over the past few weeks.  No fevers.  Her most recent colonoscopy was 10/17/2013 which identified 2 hyperplastic polyps removed from the colon. Her weight fluctuates up or down 5 pounds.  No significant weight loss.  No GERD symptoms on Pantoprazole  40 mg daily and Famotidine  20 mg twice daily.      Latest Ref Rng & Units 05/23/2024    5:45 PM 11/11/2023    9:27 PM 02/04/2023   10:00 AM  CBC  WBC 4.0 - 10.5 K/uL 3.4  3.9  3.1   Hemoglobin 12.0 - 15.0 g/dL 87.9   87.4  88.2   Hematocrit 36.0 - 46.0 % 39.0  41.4  38.3   Platelets 150 - 400 K/uL 215  192  165        Latest Ref Rng & Units 05/23/2024    5:45 PM 05/02/2024    3:33 PM 11/20/2023    4:35 PM  CMP  Glucose 70 - 99 mg/dL 90  85  80   BUN 8 - 23 mg/dL 19  11  17    Creatinine 0.44 - 1.00 mg/dL 8.64  8.96  8.91   Sodium 135 - 145 mmol/L 134  141  143   Potassium 3.5 - 5.1 mmol/L 4.7  4.5  4.3   Chloride 98 - 111 mmol/L 98  103  102   CO2 22 - 32 mmol/L 24  26  26    Calcium  8.9 - 10.3 mg/dL 9.3  9.4  9.5   Total Protein 6.5 - 8.1 g/dL 7.0     Total Bilirubin 0.0 - 1.2 mg/dL 0.7     Alkaline Phos 38 - 126 U/L 70     AST 15 - 41 U/L 31     ALT 0 - 44 U/L 17     TSH 1.120 on 05/02/2024  PAST GI PROCEDURES:  Colonoscopy 10/17/2013 by Dr. Aneita: Sessile polyp measuring 5 mm in the descending colon removed Sessile polyp measuring 4 mm in the sigmoid colon removed The colon was otherwise normal Surgical [P], sigmoid and descending - HYPERPLASTIC POLYP.  Past Medical History:  Diagnosis Date   Allergic rhinitis    Arnold-Chiari malformation (HCC)    Asthma    CAD (coronary artery disease)    COPD (chronic obstructive pulmonary disease) (HCC)    Coughing    coughing since last 01-22-2019 , started on cefdinir bid x10 days, has 3 pills left today , reports she feels mcuh better , still coughing copiuus amonts of thick white sputum , denies fever nor chills, nor body aches .    DJD (degenerative joint disease)    GERD (gastroesophageal reflux disease)    Hiatal hernia    History of absence seizures    last confirmed seizure age 15     Hx of colonic polyps    Hypercholesteremia    Hypertension    Obesity    Positive PPD    Reflex sympathetic dystrophy    Sleep apnea    Varicose veins    Venous insufficiency    Past Surgical History:  Procedure Laterality Date   ABDOMINAL HYSTERECTOMY     CHOLECYSTECTOMY N/A 02/19/2023   Procedure: LAPAROSCOPIC CHOLECYSTECTOMY WITH  INTRAOPERATIVE CHOLANGIOGRAM AND ICG DYE;  Surgeon: Tanda Locus, MD;  Location: [facility] OR;  Service: General;  Laterality: N/A;   cspine surgery  1993   for arnold-chiari malformation   IR BONE MARROW BIOPSY & ASPIRATION  01/28/2023   JOINT REPLACEMENT  06/05/11   Left total knee replacement   right knee arthroscopy  12/2007   Dr. Duwayne   right total knee replacement  05/2008   Dr. Duwayne   TOTAL KNEE REVISION Left 02/03/2019   Procedure: LEFT TOTAL KNEE REVISION;  Surgeon: Fidel Rogue, MD;  Location: [facility] ORS;  Service: Orthopedics;  Laterality: Left;   Current Outpatient Medications on File Prior to Visit  Medication Sig Dispense Refill   acetaminophen  (TYLENOL ) 500 MG tablet Take 1,000 mg by mouth every 6 (six) hours as needed for moderate pain.     albuterol  (PROVENTIL  HFA;VENTOLIN  HFA) 108 (90 BASE) MCG/ACT inhaler Inhale 2 puffs into the lungs every 4 (four) hours as needed. For wheeze or shortness of breath 1 Inhaler 6   alendronate  (FOSAMAX ) 70 MG tablet Take 70 mg by mouth once a week.     aspirin  81 MG tablet Take 81 mg by mouth daily.     azelastine  (ASTELIN ) 0.1 % nasal spray Place 1 spray into both nostrils 2 (two) times daily as needed for rhinitis or allergies.     budesonide -formoterol  (SYMBICORT ) 80-4.5 MCG/ACT inhaler Inhale 2 puffs into the lungs 2 (two) times daily as needed (shortness of breath).     CVS D3 50 MCG (2000 UT) CAPS Take 1 capsule by mouth daily.     cyanocobalamin  (VITAMIN B12) 1000 MCG tablet Take 1,000 mcg by mouth daily.     donepezil  (ARICEPT  ODT) 5 MG disintegrating tablet Take 1 tablet (5 mg total) by mouth at bedtime. 90 tablet 3   donepezil  (ARICEPT ) 5 MG tablet Take 5 mg by mouth at bedtime.     EPINEPHrine  0.3 mg/0.3 mL IJ SOAJ injection Inject 0.3 mg into the muscle as needed for anaphylaxis.     famotidine  (PEPCID ) 20 MG tablet TAKE ONE TABLET BY MOUTH TWICE DAILY 60 tablet 1   ferrous sulfate  325 (65 FE) MG EC tablet Take 325 mg by mouth daily.      fluticasone  (FLONASE ) 50 MCG/ACT nasal spray Place 1 spray into both nostrils 2 (two) times daily as needed for allergies or rhinitis.     lidocaine  (  LIDODERM ) 5 % Place 1 patch onto the skin daily. Remove & Discard patch within 12 hours or as directed by MD (Patient taking differently: Place 1 patch onto the skin as needed (pain). Remove & Discard patch within 12 hours or as directed by MD) 7 patch 0   loperamide (IMODIUM A-D) 2 MG tablet Take 2 mg by mouth as needed for diarrhea or loose stools.     lubiprostone  (AMITIZA ) 8 MCG capsule TAKE 1 CAPSULE (8 MCG TOTAL) BY MOUTH 2 (TWO) TIMES DAILY WITH A MEAL. 60 capsule 3   meloxicam  (MOBIC ) 15 MG tablet TAKE ONE TABLET BY MOUTH ONCE daily AS NEEDED FOR PAIN 30 tablet 2   montelukast  (SINGULAIR ) 10 MG tablet TAKE 1 TABLET BY MOUTH EVERY DAY IN THE EVENING 90 tablet 1   mupirocin  cream (BACTROBAN ) 2 % Apply 1 Application topically 2 (two) times daily. 15 g 0   pantoprazole  (PROTONIX ) 40 MG tablet TAKE 1 TABLET BY MOUTH EVERY DAY 90 tablet 1   potassium chloride  SA (KLOR-CON  M20) 20 MEQ tablet Take 1 tablet (20 mEq total) by mouth 2 (two) times daily. 180 tablet 3   rosuvastatin  (CRESTOR ) 40 MG tablet Take 1 tablet (40 mg total) by mouth daily. 90 tablet 3   Tiotropium Bromide Monohydrate (SPIRIVA RESPIMAT) 1.25 MCG/ACT AERS Inhale 1.25 mcg into the lungs daily.     tiZANidine  (ZANAFLEX ) 2 MG tablet Take 1 tablet (2 mg total) by mouth every 6 (six) hours as needed for muscle spasms. 40 tablet 1   Torsemide  40 MG TABS Take 1 tablet (40 mg) by mouth daily every morning (Patient taking differently: Per patient taking 4 tablets daily.) 30 tablet 11   traZODone  (DESYREL ) 100 MG tablet TAKE ONE TABLET BY MOUTH EVERYDAY AT BEDTIME 90 tablet 2   Current Facility-Administered Medications on File Prior to Visit  Medication Dose Route Frequency Provider Last Rate Last Admin   Spy Agent Green / Firefly Optime  1.25 mg Intravenous Once Tanda Locus, MD       No  Known Allergies  Current Medications, Allergies, Past Medical History, Past Surgical History, Family History and Social History were reviewed in [employer] record.  Review of Systems:   Constitutional: Negative for fever, sweats, chills or weight loss.  Respiratory: Negative for shortness of breath.   Cardiovascular: Negative for chest pain, palpitations and leg swelling.  Gastrointestinal: See HPI.  Musculoskeletal: Negative for back pain or muscle aches.  Neurological: Negative for dizziness, headaches or paresthesias.   Physical Exam: There were no vitals taken for this visit. BP 110/62   Pulse (!) 44   Ht 4' 11 (1.499 m)   Wt 200 lb (90.7 kg)   BMI 40.40 kg/m   Wt Readings from Last 3 Encounters:  06/16/24 200 lb (90.7 kg)  05/23/24 204 lb 3.2 oz (92.6 kg)  05/02/24 205 lb (93 kg)    General: 83 year old female in no acute distress ambulating with a walker. Head: Normocephalic and atraumatic. Eyes: No scleral icterus. Conjunctiva pink . Ears: Normal auditory acuity. Mouth: Dentition intact. No ulcers or lesions.  Lungs: Clear throughout to auscultation. Heart: Regular rate and rhythm, very soft systolic murmur. Abdomen: Soft, nondistended. Moderate LLQ tenderness without rebound or guarding. No masses or hepatomegaly. Normal bowel sounds x 4 quadrants. Lower midline scar intact.  Rectal: Deferred. Musculoskeletal: Symmetrical with no gross deformities. Extremities: Bilateral LE edema.  Neurological: Alert oriented x 4. No focal deficits.  Psychological: Alert and  cooperative. Normal mood and affect  Assessment and Recommendations:  83 year old female with lower abdominal pain > LLQ x 2 months which is somewhat worsened over the past 2 weeks. -CBC, CMP, sed rate and CRP -CTAP with oral contrast only -MiraLAX  nightly, further bowel regimen instructions to be determined after CTAP results reviewed -Hold Benefiber for now -IBgard 1 capsule p.o.  twice daily as needed for abdominal pain -Diagnostic colonoscopy deferred for now -Patient instructed go the emergency room if she develops severe abdominal pain  Altered bowel pattern, prior chronic constipation -See plan above  History of colon polyps. Two hyperplastic polyps removed from the colon for colonoscopy 10/2018.  No known family history of colon cancer. -No further colon polyp surveillance colonoscopies recommended due to age  History of nonobstructive CAD.  No angina.  Elevated creatinine level 05/2024 -BMP

## 2024-06-19 ENCOUNTER — Ambulatory Visit: Payer: Self-pay | Admitting: Nurse Practitioner

## 2024-06-20 NOTE — Progress Notes (Signed)
 Elmer Sow. Pilar Plate, MD [facility] [email or web address]

## 2024-06-21 ENCOUNTER — Ambulatory Visit (INDEPENDENT_AMBULATORY_CARE_PROVIDER_SITE_OTHER): Admitting: Allergy and Immunology

## 2024-06-21 ENCOUNTER — Other Ambulatory Visit: Payer: Self-pay

## 2024-06-21 ENCOUNTER — Encounter: Payer: Self-pay | Admitting: Allergy and Immunology

## 2024-06-21 ENCOUNTER — Telehealth: Payer: Self-pay

## 2024-06-21 VITALS — BP 110/78 | HR 48 | Temp 98.0°F | Ht <= 58 in | Wt 203.6 lb

## 2024-06-21 DIAGNOSIS — R490 Dysphonia: Secondary | ICD-10-CM

## 2024-06-21 DIAGNOSIS — J4540 Moderate persistent asthma, uncomplicated: Secondary | ICD-10-CM | POA: Diagnosis not present

## 2024-06-21 DIAGNOSIS — Z8669 Personal history of other diseases of the nervous system and sense organs: Secondary | ICD-10-CM

## 2024-06-21 DIAGNOSIS — Z87891 Personal history of nicotine dependence: Secondary | ICD-10-CM | POA: Diagnosis not present

## 2024-06-21 DIAGNOSIS — K219 Gastro-esophageal reflux disease without esophagitis: Secondary | ICD-10-CM

## 2024-06-21 DIAGNOSIS — J3089 Other allergic rhinitis: Secondary | ICD-10-CM | POA: Diagnosis not present

## 2024-06-21 DIAGNOSIS — R1310 Dysphagia, unspecified: Secondary | ICD-10-CM | POA: Diagnosis not present

## 2024-06-21 MED ORDER — FAMOTIDINE 40 MG PO TABS: 40.0000 mg | ORAL_TABLET | Freq: Every evening | ORAL | 1 refills | Status: DC

## 2024-06-21 MED ORDER — PANTOPRAZOLE SODIUM 40 MG PO TBEC: 40.0000 mg | DELAYED_RELEASE_TABLET | Freq: Every morning | ORAL | 1 refills | Status: DC

## 2024-06-21 MED ORDER — BREZTRI AEROSPHERE 160-9-4.8 MCG/ACT IN AERO: 2.0000 | INHALATION_SPRAY | Freq: Two times a day (BID) | RESPIRATORY_TRACT | 1 refills | Status: DC

## 2024-06-21 MED ORDER — LORATADINE 10 MG PO TABS
10.0000 mg | ORAL_TABLET | Freq: Every day | ORAL | 1 refills | Status: DC | PRN
Start: 2024-06-21 — End: 2024-09-07

## 2024-06-21 MED ORDER — ALBUTEROL SULFATE HFA 108 (90 BASE) MCG/ACT IN AERS: 2.0000 | INHALATION_SPRAY | RESPIRATORY_TRACT | 1 refills | Status: DC | PRN

## 2024-06-21 MED ORDER — FLUTICASONE PROPIONATE 50 MCG/ACT NA SUSP: 1.0000 | Freq: Two times a day (BID) | NASAL | 1 refills | Status: DC

## 2024-06-21 NOTE — Telephone Encounter (Signed)
 Please refer patient to ENT per Dr. Kozlow for painful swallowing and hoarseness. Thank you!

## 2024-06-21 NOTE — Progress Notes (Unsigned)
 [place] - [place] - [place] - [place] - [place]   Dear April,  Thank you for referring [name] to the [facility] [place]  on 06/21/2024.   Below is a summation of this patient's evaluation and recommendations.  Thank you for your referral. I will keep you informed about this patient's response to treatment.   If you have any questions please do not hesitate to contact me.   Sincerely,  April DOROTHA Denis, MD Allergy / Immunology [facility] [place]    ______________________________________________________________________    NEW PATIENT NOTE  Referring Provider: [name], MD Primary Provider: [name], MD Date of office visit: 06/21/2024    Subjective:   Chief Complaint:  [name] (DOB: [birth year]) is a 83 y.o. female who presents to the clinic on 06/21/2024 with a chief complaint of Asthma, Wheezing, and Establish Care .     HPI: [name] presents to this clinic in evaluation of respiratory problems.  She has apparently been receiving immunotherapy from Dr. Altamease for the past several years in the treatment of what sounds like asthma and allergic rhinitis.  Because of an insurance issue she can no longer see Dr. Altamease.  Her big complaint today is that she has developed hoarseness and painful swallowing and sometimes it is difficult to swallow.  Food sometimes gets hung up in her throat and she needs to cough it up.  This has been going on for the past 3 months or so.  She has a history of asthma in the context of smoking from the age of 37 to 11 and it seems as though she uses Breztri  on a consistent basis and occasionally uses a Symbicort  as a rescue inhaler.  She is a little confused about which inhaler she uses at what point in time.  She does get stuffy as well and she has not been using her nasal spray over the course of the past few months.  She does not have any  anosmia.  She does have reflux disease that is still active with regurgitation even though she has been using some type of medicine for her reflux on a daily basis.  She occasionally drinks caffeine and occasionally eats chocolate.  She has a history of Arnold Chiari malformation.  She has a history of congestive heart failure.  She is having some lower abdominal pain which is currently being evaluated with a CT scan scheduled for this week.  Past Medical History:  Diagnosis Date   Allergic rhinitis    Arnold-Chiari malformation (HCC)    Asthma    CAD (coronary artery disease)    COPD (chronic obstructive pulmonary disease) (HCC)    Coughing    coughing since last 01-22-2019 , started on cefdinir bid x10 days, has 3 pills left today , reports she feels mcuh better , still coughing copiuus amonts of thick white sputum , denies fever nor chills, nor body aches .    DJD (degenerative joint disease)    GERD (gastroesophageal reflux disease)    Hiatal hernia    History of absence seizures    last confirmed seizure age 84     Hx of colonic polyps    Hypercholesteremia    Hypertension    Obesity    Positive PPD    Reflex sympathetic dystrophy    Sleep apnea    Varicose veins    Venous insufficiency     Past Surgical History:  Procedure Laterality Date   ABDOMINAL HYSTERECTOMY     CHOLECYSTECTOMY N/A 02/19/2023   Procedure: LAPAROSCOPIC CHOLECYSTECTOMY WITH INTRAOPERATIVE CHOLANGIOGRAM AND ICG DYE;  Surgeon: April Locus, MD;  Location: [facility] OR;  Service: General;  Laterality: N/A;   cspine surgery  12/02/1991   for arnold-chiari malformation   IR BONE MARROW BIOPSY & ASPIRATION  01/28/2023   JOINT REPLACEMENT  06/05/2011   Left total knee replacement   right knee arthroscopy  12/02/2007   Dr. Duwayne   right total knee replacement  05/31/2008   Dr. Duwayne   TONSILLECTOMY     TOTAL KNEE REVISION Left 02/03/2019   Procedure: LEFT TOTAL KNEE REVISION;  Surgeon: April Rogue, MD;   Location: [facility] ORS;  Service: Orthopedics;  Laterality: Left;    Allergies as of 06/21/2024   No Known Allergies      Medication List    acetaminophen  500 MG tablet Commonly known as: TYLENOL  Take 1,000 mg by mouth every 6 (six) hours as needed for moderate pain.   albuterol  108 (90 Base) MCG/ACT inhaler Commonly known as: VENTOLIN  HFA Inhale 2 puffs into the lungs every 4 (four) hours as needed. For wheeze or shortness of breath   alendronate  70 MG tablet Commonly known as: FOSAMAX  Take 70 mg by mouth once a week.   aspirin  81 MG tablet Take 81 mg by mouth daily.   azelastine  0.1 % nasal spray Commonly known as: ASTELIN  Place 1 spray into both nostrils 2 (two) times daily as needed for rhinitis or allergies.   budesonide -formoterol  80-4.5 MCG/ACT inhaler Commonly known as: SYMBICORT  Inhale 2 puffs into the lungs 2 (two) times daily as needed (shortness of breath).   [facility] D3 50 MCG (2000 UT) Caps Generic drug: Cholecalciferol Take 1 capsule by mouth daily.   cyanocobalamin  1000 MCG tablet Commonly known as: VITAMIN B12 Take 1,000 mcg by mouth daily.   donepezil  5 MG tablet Commonly known as: ARICEPT  Take 5 mg by mouth at bedtime.   donepezil  5 MG disintegrating tablet Commonly known as: ARICEPT  ODT Take 1 tablet (5 mg total) by mouth at bedtime.   EPINEPHrine  0.3 mg/0.3 mL Soaj injection Commonly known as: EPI-PEN Inject 0.3 mg into the muscle as needed for anaphylaxis.   famotidine  20 MG tablet Commonly known as: PEPCID  TAKE ONE TABLET BY MOUTH TWICE DAILY   ferrous sulfate  325 (65 FE) MG EC tablet Take 325 mg by mouth daily.   fluticasone  50 MCG/ACT nasal spray Commonly known as: FLONASE  Place 1 spray into both nostrils 2 (two) times daily as needed for allergies or rhinitis.   lidocaine  5 % Commonly known as: Lidoderm  Place 1 patch onto the skin daily. Remove & Discard patch within 12 hours or as directed by MD   loperamide 2 MG tablet Commonly  known as: IMODIUM A-D Take 2 mg by mouth as needed for diarrhea or loose stools.   lubiprostone  8 MCG capsule Commonly known as: AMITIZA  TAKE 1 CAPSULE (8 MCG TOTAL) BY MOUTH 2 (TWO) TIMES DAILY WITH A MEAL.   meloxicam  15 MG tablet Commonly known as: MOBIC  TAKE ONE TABLET BY MOUTH ONCE daily AS NEEDED FOR PAIN   montelukast  10 MG tablet Commonly known as: SINGULAIR  TAKE 1 TABLET BY MOUTH EVERY DAY IN THE EVENING   mupirocin  cream 2 % Commonly known as: BACTROBAN  Apply 1 Application topically 2 (two) times daily.   pantoprazole  40 MG tablet Commonly known as: PROTONIX  TAKE 1 TABLET BY MOUTH EVERY DAY   potassium  chloride SA 20 MEQ tablet Commonly known as: Klor-Con  M20 Take 1 tablet (20 mEq total) by mouth 2 (two) times daily.   rosuvastatin  40 MG tablet Commonly known as: CRESTOR  Take 1 tablet (40 mg total) by mouth daily.   Spiriva Respimat 1.25 MCG/ACT Aers Generic drug: Tiotropium Bromide Monohydrate Inhale 1.25 mcg into the lungs daily.   tiZANidine  2 MG tablet Commonly known as: ZANAFLEX  Take 1 tablet (2 mg total) by mouth every 6 (six) hours as needed for muscle spasms.   Torsemide  40 MG Tabs Take 1 tablet (40 mg) by mouth daily every morning What changed: additional instructions   traZODone  100 MG tablet Commonly known as: DESYREL  TAKE ONE TABLET BY MOUTH EVERYDAY AT BEDTIME    Review of systems negative except as noted in HPI / PMHx or noted below:  Review of Systems  Constitutional: Negative.   HENT: Negative.    Eyes: Negative.   Respiratory: Negative.    Cardiovascular: Negative.   Gastrointestinal: Negative.   Genitourinary: Negative.   Musculoskeletal: Negative.   Skin: Negative.   Neurological: Negative.   Endo/Heme/Allergies: Negative.   Psychiatric/Behavioral: Negative.      Family History  Problem Relation Age of Onset   Heart disease Mother    Stroke Mother    Stroke Father    Clotting disorder Father    Asthma Sister    Colon  cancer Neg Hx    Stomach cancer Neg Hx     Social History   Socioeconomic History   Marital status: Widowed    Spouse name: Not on file   Number of children: 2   Years of education: 43   Highest education level: Not on file  Occupational History   Occupation: retired  Tobacco Use   Smoking status: Former    Current packs/day: 0.00    Average packs/day: 0.3 packs/day for 12.0 years (3.6 ttl pk-yrs)    Types: Cigarettes    Start date: 01/02/2000    Quit date: 01/02/2012    Years since quitting: 12.4   Smokeless tobacco: Never   Tobacco comments:    1 pack per week  Vaping Use   Vaping status: Never Used  Substance and Sexual Activity   Alcohol  use: No   Drug use: No   Sexual activity: Not Currently  Other Topics Concern   Not on file  Social History Narrative   Fun/Hobby: Playing cards and mingling.   Denies abuse and feels safe at home.    Social Drivers of Corporate investment banker Strain: Not on file  Food Insecurity: Not on file  Transportation Needs: Not on file  Physical Activity: Not on file  Stress: Not on file  Social Connections: Not on file  Intimate Partner Violence: Not on file    Environmental and Social history  Lives in a house with a dry environment, no animals looking inside the household, carpet in the bedroom, plastic on the bed, plastic on the pillow, no smoking ongoing with inside the household. Objective:   Vitals:   06/21/24 1021  BP: 110/78  Pulse: (!) 48  Temp: 98 F (36.7 C)   Height: 4' 10 (147.3 cm) Weight: 203 lb 9.6 oz (92.4 kg)  Physical Exam Constitutional:      Appearance: She is not diaphoretic.  HENT:     Head: Normocephalic.     Right Ear: Tympanic membrane, ear canal and external ear normal.     Left Ear: Tympanic membrane, ear canal and external  ear normal.     Nose: Nose normal. No mucosal edema or rhinorrhea.     Mouth/Throat:     Pharynx: Uvula midline. No oropharyngeal exudate.  Eyes:      Conjunctiva/sclera: Conjunctivae normal.  Neck:     Thyroid : No thyromegaly.     Trachea: Trachea normal. No tracheal tenderness or tracheal deviation.  Cardiovascular:     Rate and Rhythm: Normal rate and regular rhythm.     Heart sounds: Normal heart sounds, S1 normal and S2 normal. No murmur heard. Pulmonary:     Effort: No respiratory distress.     Breath sounds: Normal breath sounds. No stridor. No wheezing or rales.  Lymphadenopathy:     Head:     Right side of head: No tonsillar adenopathy.     Left side of head: No tonsillar adenopathy.     Cervical: No cervical adenopathy.  Skin:    Findings: No erythema or rash.     Nails: There is no clubbing.  Neurological:     Mental Status: She is alert.     Diagnostics: Allergy skin tests were performed.   Spirometry was performed and demonstrated an FEV1 of *** @ *** % of predicted. FEV1/FVC = ***  The patient had an Asthma Control Test with the following results:  .     Assessment and Plan:    No diagnosis found.  Patient Instructions   1. Treat and prevent inflammation of airway:   A. Breztri  - 2 inhalations 2 times per day (empty lungs)  B. Fluticasone  - 1 spray each nostril 2 times per day  2. Treat and prevent reflux:   A. Start pantoprazole  40 mg - 1 tablet in AM  B. Start famotidine  40 mg - 1 tablet in PM  C. Eliminate caffeine / chocolate consumption  3. If needed:   A. Albuterol  - 2 inhalations every 4-6 hours  B. Loratadine  10 - 1 tablet 1 time per day  4. Obtain evaluation of throat with ENT ASAP for painful swallowing and hoarseness  5. Obtain modified barium swallow for swallowing difficulties  6. Return to clinic in 4 weeks or earlier if problem   April DOROTHA Denis, MD Allergy / Immunology [facility] [place] 

## 2024-06-21 NOTE — Patient Instructions (Addendum)
  1. Treat and prevent inflammation of airway:   A. Breztri  - 2 inhalations 2 times per day (empty lungs)  B. Fluticasone  - 1 spray each nostril 2 times per day  2. Treat and prevent reflux:   A. Start pantoprazole  40 mg - 1 tablet in AM  B. Start famotidine  40 mg - 1 tablet in PM  C. Eliminate caffeine / chocolate consumption  3. If needed:   A. Albuterol  - 2 inhalations every 4-6 hours  B. Loratadine  10 - 1 tablet 1 time per day  4. Obtain evaluation of throat with ENT ASAP for painful swallowing and hoarseness  5. Obtain modified barium swallow for swallowing difficulties  6. Return to clinic in 4 weeks or earlier if problem

## 2024-06-22 ENCOUNTER — Encounter: Payer: Self-pay | Admitting: Allergy and Immunology

## 2024-06-22 MED ORDER — FLUTICASONE PROPIONATE 50 MCG/ACT NA SUSP: 1.0000 | Freq: Two times a day (BID) | NASAL | 1 refills | Status: DC

## 2024-06-23 ENCOUNTER — Other Ambulatory Visit (HOSPITAL_COMMUNITY): Payer: Self-pay | Admitting: Allergy and Immunology

## 2024-06-23 ENCOUNTER — Ambulatory Visit (HOSPITAL_COMMUNITY)
Admission: RE | Admit: 2024-06-23 | Discharge: 2024-06-23 | Disposition: A | Discharge status: Home / Self Care | Source: Ambulatory Visit | Attending: Nurse Practitioner | Admitting: Nurse Practitioner

## 2024-06-23 DIAGNOSIS — R1032 Left lower quadrant pain: Secondary | ICD-10-CM | POA: Insufficient documentation

## 2024-06-23 DIAGNOSIS — K5900 Constipation, unspecified: Secondary | ICD-10-CM | POA: Diagnosis not present

## 2024-06-23 DIAGNOSIS — K439 Ventral hernia without obstruction or gangrene: Secondary | ICD-10-CM | POA: Diagnosis not present

## 2024-06-23 DIAGNOSIS — R059 Cough, unspecified: Secondary | ICD-10-CM

## 2024-06-23 DIAGNOSIS — R1310 Dysphagia, unspecified: Secondary | ICD-10-CM

## 2024-06-23 DIAGNOSIS — K5730 Diverticulosis of large intestine without perforation or abscess without bleeding: Secondary | ICD-10-CM | POA: Diagnosis not present

## 2024-06-23 MED ORDER — IOHEXOL 9 MG/ML PO SOLN
1000.0000 mL | ORAL | Status: AC
  Administered 2024-06-23: 1000 mL via ORAL

## 2024-06-24 ENCOUNTER — Encounter (INDEPENDENT_AMBULATORY_CARE_PROVIDER_SITE_OTHER): Payer: Self-pay

## 2024-06-27 NOTE — Telephone Encounter (Signed)
 Inbound call from patient requesting to have information that she just received text to her so she is able to inform her sister. Advised that information would be able to be sent through Mychart. States she is unsure how to use Mychart. Requesting a call to discuss other alternatives to have a copy in information recently discussed. Please advise, thank you

## 2024-06-29 DIAGNOSIS — R944 Abnormal results of kidney function studies: Secondary | ICD-10-CM | POA: Diagnosis not present

## 2024-06-30 DIAGNOSIS — M810 Age-related osteoporosis without current pathological fracture: Secondary | ICD-10-CM | POA: Diagnosis not present

## 2024-06-30 DIAGNOSIS — F339 Major depressive disorder, recurrent, unspecified: Secondary | ICD-10-CM | POA: Diagnosis not present

## 2024-06-30 DIAGNOSIS — I2510 Atherosclerotic heart disease of native coronary artery without angina pectoris: Secondary | ICD-10-CM | POA: Diagnosis not present

## 2024-07-07 DIAGNOSIS — R7303 Prediabetes: Secondary | ICD-10-CM | POA: Diagnosis not present

## 2024-07-07 DIAGNOSIS — E538 Deficiency of other specified B group vitamins: Secondary | ICD-10-CM | POA: Diagnosis not present

## 2024-07-07 DIAGNOSIS — F339 Major depressive disorder, recurrent, unspecified: Secondary | ICD-10-CM | POA: Diagnosis not present

## 2024-07-07 DIAGNOSIS — I10 Essential (primary) hypertension: Secondary | ICD-10-CM | POA: Diagnosis not present

## 2024-07-07 DIAGNOSIS — I739 Peripheral vascular disease, unspecified: Secondary | ICD-10-CM | POA: Diagnosis not present

## 2024-07-07 DIAGNOSIS — E7800 Pure hypercholesterolemia, unspecified: Secondary | ICD-10-CM | POA: Diagnosis not present

## 2024-07-07 DIAGNOSIS — J449 Chronic obstructive pulmonary disease, unspecified: Secondary | ICD-10-CM | POA: Diagnosis not present

## 2024-07-08 ENCOUNTER — Ambulatory Visit (HOSPITAL_COMMUNITY)
Admission: RE | Admit: 2024-07-08 | Discharge: 2024-07-08 | Disposition: A | Discharge status: Home / Self Care | Source: Ambulatory Visit | Attending: *Deleted | Admitting: *Deleted

## 2024-07-08 DIAGNOSIS — K219 Gastro-esophageal reflux disease without esophagitis: Secondary | ICD-10-CM | POA: Diagnosis not present

## 2024-07-08 DIAGNOSIS — J4489 Other specified chronic obstructive pulmonary disease: Secondary | ICD-10-CM | POA: Diagnosis not present

## 2024-07-08 DIAGNOSIS — Z87728 Personal history of other specified (corrected) congenital malformations of nervous system and sense organs: Secondary | ICD-10-CM | POA: Diagnosis not present

## 2024-07-08 DIAGNOSIS — R490 Dysphonia: Secondary | ICD-10-CM | POA: Diagnosis present

## 2024-07-08 DIAGNOSIS — I2510 Atherosclerotic heart disease of native coronary artery without angina pectoris: Secondary | ICD-10-CM | POA: Insufficient documentation

## 2024-07-08 DIAGNOSIS — R1310 Dysphagia, unspecified: Secondary | ICD-10-CM

## 2024-07-08 DIAGNOSIS — R059 Cough, unspecified: Secondary | ICD-10-CM | POA: Insufficient documentation

## 2024-07-08 NOTE — Evaluation (Signed)
 Modified Barium Swallow Study  Patient Details  Name: [name] MRN: [id number] Date of Birth: [birth year]  Today's Date: 07/08/2024  Modified Barium Swallow completed.  Full report located under Chart Review in the Imaging Section.  History of Present Illness [name] is an 83 y.o. female who was referred for OP MBS due to reports of hoarseness and difficulty swallowing ~ three months. She describes coughing with liquids, solid food lodging in her throat nearly all meals.  Hoarseness is mostly evident at the end of the day.  PMHx Arnold Chiari malformation, CAD, COPD, GERD, LPR, asthma. Pt also has referral in place to ENT.   Clinical Impression Pt presents with functional oropharyngeal swallow with normal oral phase, normal propulsion of materials through pharynx with no residue post-swallow, no aspiration, transient and trace penetration of thin liquids into the larynx without descent to the vocal folds and often ejected upon completion of the swallow (PAS score of 2 and 3).  Pt was asymptomatic during study and physiology of swallow was quite functional. Reviewed study in real time with pt and we discussed absence of any remarkable findings.   Recommend she continue with regular solids; thin liquids.  She has a pending consult with ENT.  No further SLP f/u at this time. Hoarse vocal quality can be addressed by ENT and referral made for OP voice therapy with SLP should it be warranted.   Factors that may increase risk of adverse event in presence of aspiration Noe & Lianne 2021):  none  Swallow Evaluation Recommendations Recommendations: PO diet PO Diet Recommendation: Regular;Thin liquids (Level 0) Liquid Administration via: Cup;Straw Medication Administration: Whole meds with liquid Supervision: Patient able to self-feed    Justo Hengel L. Vona, MA CCC/SLP Clinical Specialist - Acute Care SLP Acute Rehabilitation Services Office number [phone]   Vona Palma  Laurice 07/08/2024,12:43 PM

## 2024-07-11 ENCOUNTER — Ambulatory Visit: Payer: Self-pay | Admitting: Allergy and Immunology

## 2024-07-12 ENCOUNTER — Ambulatory Visit (INDEPENDENT_AMBULATORY_CARE_PROVIDER_SITE_OTHER): Admitting: Podiatry

## 2024-07-12 ENCOUNTER — Encounter: Payer: Self-pay | Admitting: Podiatry

## 2024-07-12 DIAGNOSIS — M79674 Pain in right toe(s): Secondary | ICD-10-CM

## 2024-07-12 DIAGNOSIS — Z89422 Acquired absence of other left toe(s): Secondary | ICD-10-CM

## 2024-07-12 DIAGNOSIS — I739 Peripheral vascular disease, unspecified: Secondary | ICD-10-CM

## 2024-07-12 DIAGNOSIS — L84 Corns and callosities: Secondary | ICD-10-CM | POA: Diagnosis not present

## 2024-07-12 DIAGNOSIS — B351 Tinea unguium: Secondary | ICD-10-CM

## 2024-07-12 DIAGNOSIS — M79675 Pain in left toe(s): Secondary | ICD-10-CM | POA: Diagnosis not present

## 2024-07-12 MED ORDER — POVIDONE-IODINE 10 % EX SOLN: CUTANEOUS | 0 refills | Status: AC

## 2024-07-13 ENCOUNTER — Telehealth: Payer: Self-pay | Admitting: Gastroenterology

## 2024-07-13 NOTE — Telephone Encounter (Signed)
 Patient called stating that she could not afford the Amitiza  she was prescribed and asked if there were samples or, if not, if there was something else more affordable she could take.  Please call patient and advise.  Thank you.

## 2024-07-13 NOTE — Telephone Encounter (Signed)
 Unfortunately, Amitiza  has gone generic so we no longer get samples of this medication. That said, we also cannot get patient assistance from Amitiza  manufacturer any longer for this drug.   Patient has previously tried and failed Linzess . Looks like she also takes Miralax .  One option would be using a GoodRx card for Lubiprostone  24 mcg #60 capsules which would be $44.60 per month at [facility] although I am unsure how much the medication costs under her insurance that she finds unaffordable.  Colleen- Any alternatives that may be more cost effective for her?

## 2024-07-13 NOTE — Telephone Encounter (Signed)
 Patient is advised that she may use a goodrx card to purchase Amitza  at 44 dollars per month, however if this is still unacceptable, can recommend over the counter supplement like milk of magnesia 2 tablespoons every 2-3 days. Patient states she will see what she can do. She also asks for clarification on what the medication is for. I did instruct this is to regulate her bowels and relieve constipation. She verbalizes understanding.

## 2024-07-13 NOTE — Telephone Encounter (Signed)
 Dottie, if your suggestion for Lubiprostone  24 mcg bid with Good RX card is not affordable, patient can take Milk of Magnesia 2 tablespoons every 2 to 3 days as needed or Prunelax 1 to 2 tabs at bed time every 2 to 3 days as needed purchased OTC.  Pls see if patient can check with her insurance carrier to verify if Trulance or Ibsrela covered.

## 2024-07-14 NOTE — Telephone Encounter (Signed)
 PT returning call and said the coupon is not working. Please advise.

## 2024-07-14 NOTE — Telephone Encounter (Signed)
 Patient reports that she does not wish to pay for the Amitiza .  We discussed Bayley McMichael PA recommendations of MOM 2 tbsp every 2-3 days.  Patient verbalized understanding.

## 2024-07-17 ENCOUNTER — Other Ambulatory Visit: Payer: Self-pay | Admitting: Internal Medicine

## 2024-07-17 NOTE — Progress Notes (Unsigned)
   [address] [place] [address] Dept: [phone]  FOLLOW UP NOTE  Patient ID: [name], female    DOB: 07-Nov-1941  Age: 83 y.o. MRN: [id number] Date of Office Visit: 07/18/2024  Assessment  Chief Complaint: No chief complaint on file.  HPI [name]   Discussed the use of AI scribe software for clinical note transcription with the patient, who gave verbal consent to proceed.  History of Present Illness      Drug Allergies:  No Known Allergies  Physical Exam: There were no vitals taken for this visit.   Physical Exam  Diagnostics:    Assessment and Plan: No diagnosis found.  No orders of the defined types were placed in this encounter.   There are no Patient Instructions on file for this visit.  No follow-ups on file.    Thank you for the opportunity to care for this patient.  Please do not hesitate to contact me with questions.  Arlean Mutter, FNP [facility]

## 2024-07-17 NOTE — Progress Notes (Signed)
  Subjective:  Patient ID: [name], female    DOB: [birth year],  MRN: [id number]  [name] presents to clinic today for at risk foot care. Patient has h/o PAD with h/o amputation(s) of L 2nd toe and thick, elongated toenails of both feet which are tender when wearing enclosed shoe gear.   New problem(s): None.   PCP is [name], MD.LOV 07/07/2024.  No Known Allergies  Review of Systems: Negative except as noted in the HPI.  Objective: No changes noted in today's physical examination. There were no vitals filed for this visit. [name] is a pleasant 83 y.o. female in NAD. AAO x 3.  Vascular Examination: Capillary refill time immediate b/l. Vascular status intact b/l with palpable DP pulses; faintly palpable PT pulses. Pedal hair absent b/l. No pain with calf compression b/l. Skin temperature gradient WNL b/l. No cyanosis or clubbing noted b/l. No ischemia or gangrene b/l. Trace edema noted BLE.  Neurological Examination: Sensation grossly intact b/l with 10 gram monofilament. Vibratory sensation intact b/l.   Dermatological Examination: Pedal skin with normal turgor, texture and tone b/l.  No open wounds. No interdigital macerations.   Toenails left great toe,  right 2nd digit, right 3rd toe and 4-5 b/l thick, discolored, elongated with subungual debris and pain on dorsal palpation.   Anonychia noted L 3rd toe and R hallux. Nailbed(s) epithelialized.  Preulcerative lesion noted 1st webspace left foot. There is visible subdermal hemorrhage. There is no surrounding erythema, no edema, no drainage, no odor, no fluctuance.  Musculoskeletal Examination: Muscle strength 5/5 to all lower extremity muscle groups bilaterally. Lower extremity amputation(s): digital amputation left second digit. Hallux valgus with bunion deformity noted b/l lower extremities left >right.  Radiographs: None  Assessment/Plan: 1. Pain due to onychomycosis of toenails of both feet   2.  Pre-ulcerative calluses   3. Status post amputation of lesser toe of left foot (HCC)   4. PVD (peripheral vascular disease) (HCC)     Meds ordered this encounter  Medications   povidone-iodine  (BETADINE ) 10 % external solution    Sig: Paint  in webspace next to left great toe once daily    Dispense:  118 mL    Refill:  0  -Patient was evaluated today. All questions/concerns addressed on today's visit. -Patient to continue soft, supportive shoe gear daily. -Toenails bilateral 4th toes, bilateral 5th toes, L hallux, R 2nd toe, and R 3rd toe debrided in length and girth without iatrogenic bleeding with sterile nail nipper and dremel.  -Preulcerative lesion pared 1st webspace left foot utilizing sterile scalpel blade. Total number pared=1. Betadine  paint applied to area. Patient instructed to keep ckean, dry well under toe after bath/shower. She is to paint with Betadine  solution once daily. -Patient/POA to call should there be question/concern in the interim.   Return in about 3 months (around 10/12/2024).  Delon LITTIE Merlin, DPM      [place] LOCATION: [address] [place] [address]                   Office [phone]   [facility] LOCATION: [address] [place], [place] [address] Office [phone]

## 2024-07-17 NOTE — Patient Instructions (Incomplete)
  1. Treat and prevent inflammation of airway:   A. Breztri  - 2 inhalations 2 times per day (empty lungs)  B. Fluticasone  - 1 spray each nostril 2 times per day  2. Treat and prevent reflux:   A. Start pantoprazole  40 mg - 1 tablet in AM  B. Start famotidine  40 mg - 1 tablet in PM  C. Eliminate caffeine / chocolate consumption  3. If needed:   A. Albuterol  - 2 inhalations every 4-6 hours  B. Loratadine  10 - 1 tablet 1 time per day  4. Obtain evaluation of throat with ENT ASAP for painful swallowing and hoarseness  5. Obtain modified barium swallow for swallowing difficulties  6. Return to clinic in 4 weeks or earlier if problem

## 2024-07-18 ENCOUNTER — Ambulatory Visit (INDEPENDENT_AMBULATORY_CARE_PROVIDER_SITE_OTHER): Admitting: Family Medicine

## 2024-07-18 ENCOUNTER — Encounter: Payer: Self-pay | Admitting: Family Medicine

## 2024-07-18 ENCOUNTER — Other Ambulatory Visit: Payer: Self-pay

## 2024-07-18 VITALS — BP 112/68 | HR 59 | Temp 97.6°F | Resp 19

## 2024-07-18 DIAGNOSIS — R1310 Dysphagia, unspecified: Secondary | ICD-10-CM | POA: Insufficient documentation

## 2024-07-18 DIAGNOSIS — K219 Gastro-esophageal reflux disease without esophagitis: Secondary | ICD-10-CM

## 2024-07-18 DIAGNOSIS — J4540 Moderate persistent asthma, uncomplicated: Secondary | ICD-10-CM

## 2024-07-18 DIAGNOSIS — J302 Other seasonal allergic rhinitis: Secondary | ICD-10-CM | POA: Diagnosis not present

## 2024-07-18 DIAGNOSIS — J3089 Other allergic rhinitis: Secondary | ICD-10-CM

## 2024-07-18 DIAGNOSIS — Z87891 Personal history of nicotine dependence: Secondary | ICD-10-CM | POA: Diagnosis not present

## 2024-07-18 MED ORDER — MONTELUKAST SODIUM 10 MG PO TABS: 10.0000 mg | ORAL_TABLET | Freq: Every day | ORAL | 5 refills | Status: DC

## 2024-07-21 DIAGNOSIS — M5416 Radiculopathy, lumbar region: Secondary | ICD-10-CM | POA: Diagnosis not present

## 2024-07-29 ENCOUNTER — Encounter: Payer: Self-pay | Admitting: *Deleted

## 2024-07-31 DIAGNOSIS — M810 Age-related osteoporosis without current pathological fracture: Secondary | ICD-10-CM | POA: Diagnosis not present

## 2024-07-31 DIAGNOSIS — I2510 Atherosclerotic heart disease of native coronary artery without angina pectoris: Secondary | ICD-10-CM | POA: Diagnosis not present

## 2024-07-31 DIAGNOSIS — F339 Major depressive disorder, recurrent, unspecified: Secondary | ICD-10-CM | POA: Diagnosis not present

## 2024-08-02 ENCOUNTER — Ambulatory Visit: Admitting: Emergency Medicine

## 2024-08-02 NOTE — Progress Notes (Deleted)
 Cardiology Office Note:    Date:  08/02/2024  ID:  CATHREN SWEEN, DOB March 11, 1941, MRN [id number] PCP: [name], MD  [facility] Providers Cardiologist:  Redell Shallow, MD { Click to update primary MD,subspecialty MD or APP then REFRESH:1}    {Click to Open Review  :1}   Patient Profile:       Chief Complaint: *** History of Present Illness:  [name] is a 83 y.o. female with visit-pertinent history of CAD, HLD, dementia, COPD, DJD HFpEF, bradycardia.   She had a heart catheterization in 2005 showing 40% proximal LAD, 30% distal LAD lesion, 30-40% first diagonal proximal lesion, ostial LAD 60% and nonobstructive circumflex and RCA disease.  Nuclear stress test in 2016 with an LVEF 58% and normal perfusion.  Echocardiogram April 2019 showed normal LVEF, mild LVH, grade 1 DD, mild right atrial and right ventricular enlargement, mild TR.  She was ruled out for DVT by Dopplers November 2021.  She has history of increasing bilateral lower extremity swelling with increased fluid intake.  In March 2024 she underwent short course of increased Lasix  for lower extremity swelling.   Seen in clinic by Jon Hails, PA on 11/09/2023 for bilateral lower extremity swelling.  She noted PND, orthopnea, worsening DOE that was limiting her activity.  Her 80 mg Lasix  was switched to 40 mg torsemide .   Seen in the ED on 11/11/2023 for evaluation of chest pain.  She had a normal chest x-ray and a BNP of 20.  High sensitive troponin x2 was 10.  It was suggested that her leg swelling is secondary to venous stasis and not CHF.  Lab work does show possible dehydration given elevated creatinine of 1.44 and GFR 37.   Seen in clinic on 11/20/2023.  Her SOB, DOE, orthopnea have improved however SOB was ongoing.  She also noted to have some claudication.  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  ABIs 12/18/2023 were normal.   Patient was seen in clinic on 02/08/2024.  The patient's leg swelling has improved.  She had noted some epigastric/substernal pain particularly after eating.  The pain occurred between 7 and 8:00 each night approximately 1 hour after eating dinner.  The pain was relieved by Tums and sitting upward.  She was to follow-up with PCP regarding GERD.  She was completing water  aerobics and other activities without any exertional symptoms.  She was euvolemic and well compensated on exam.  She has a follow-up in 6 months.   Recently seen by PCP.  She was noted to be bradycardic on exam.  She was to follow-up with cardiology.  She was seen in clinic on 05/02/2024.  Patient's heart rate was 43 bpm.  She remained asymptomatic.  There is no indication for pacemaker she remains asymptomatic and has had no acute changes.  ZIO was ordered showing minimum heart rate 36 bpm, max heart rate 99 bpm, and average heart rate of 55 bpm.  She was last seen in the office on 05/23/2024.  She had previously called on 6/21 reporting worsening swelling in her lower extremities and was recommended she increase her torsemide  to 80 mg daily.  During office visit she reported edema had improved but she noted to have some numbness and weakness on the left side.  She was sent to the ED for further  evaluation  She was seen in the ED on 6/23 for weakness and numbness.  She had reported left-sided numbness and weakness for at least the last 3 to 4 days.  She underwent CT head showing no acute intracranial process.  She also underwent MRI of the brain showing no evidence of acute intracranial abnormality and chronic small vessel ischemic changes which are mild.  It was suspected her weakness may be in the setting of patient's arthritis and she was to follow-up with primary and neurology.    Discussed the use of AI scribe software  for clinical note transcription with the patient, who gave verbal consent to proceed.  History of Present Illness     Review of systems:  Please see the history of present illness. All other systems are reviewed and otherwise negative. ***      Studies Reviewed:        ***  Risk Assessment/Calculations:   {Does this patient have ATRIAL FIBRILLATION?:[id number]} No BP recorded.  {Refresh Note OR Click here to enter BP  :1}***        Physical Exam:   VS:  There were no vitals taken for this visit.   Wt Readings from Last 3 Encounters:  06/21/24 203 lb 9.6 oz (92.4 kg)  06/16/24 200 lb (90.7 kg)  05/23/24 204 lb 3.2 oz (92.6 kg)    GEN: Well nourished, well developed in no acute distress NECK: No JVD; No carotid bruits CARDIAC: ***RRR, no murmurs, rubs, gallops RESPIRATORY:  Clear to auscultation without rales, wheezing or rhonchi  ABDOMEN: Soft, non-tender, non-distended EXTREMITIES:  No edema; No acute deformity ***      Assessment and Plan:    Assessment and Plan Assessment & Plan      {Are you ordering a CV Procedure (e.g. stress test, cath, DCCV, TEE, etc)?   Press F2        :[id number]}  Dispo:  No follow-ups on file.  Signed, Lum LITTIE Louis, NP

## 2024-08-10 ENCOUNTER — Encounter: Payer: Self-pay | Admitting: *Deleted

## 2024-08-11 DIAGNOSIS — M5416 Radiculopathy, lumbar region: Secondary | ICD-10-CM | POA: Diagnosis not present

## 2024-08-12 ENCOUNTER — Ambulatory Visit: Attending: Emergency Medicine | Admitting: Emergency Medicine

## 2024-08-12 ENCOUNTER — Encounter: Payer: Self-pay | Admitting: Emergency Medicine

## 2024-08-12 VITALS — BP 128/78 | HR 58 | Ht <= 58 in | Wt 198.0 lb

## 2024-08-12 DIAGNOSIS — I5032 Chronic diastolic (congestive) heart failure: Secondary | ICD-10-CM

## 2024-08-12 DIAGNOSIS — I2510 Atherosclerotic heart disease of native coronary artery without angina pectoris: Secondary | ICD-10-CM | POA: Diagnosis not present

## 2024-08-12 DIAGNOSIS — I77810 Thoracic aortic ectasia: Secondary | ICD-10-CM | POA: Diagnosis not present

## 2024-08-12 DIAGNOSIS — E785 Hyperlipidemia, unspecified: Secondary | ICD-10-CM

## 2024-08-12 DIAGNOSIS — I10 Essential (primary) hypertension: Secondary | ICD-10-CM

## 2024-08-12 DIAGNOSIS — R001 Bradycardia, unspecified: Secondary | ICD-10-CM

## 2024-08-12 NOTE — Progress Notes (Signed)
 Cardiology Office Note:    Date:  08/12/2024  ID:  [name], DOB 12/27/1940, MRN [id number] PCP: [name], MD  [facility] Providers Cardiologist:  Redell Shallow, MD Cardiology APP:  Rana Lum CROME, NP       Patient Profile:       Chief Complaint: 60-month follow-up History of Present Illness:  [name] is a 83 y.o. female with visit-pertinent history of CAD, HLD, dementia, COPD, DJD HFpEF, bradycardia.   She had a heart catheterization in 2005 showing 40% proximal LAD, 30% distal LAD lesion, 30-40% first diagonal proximal lesion, ostial LAD 60% and nonobstructive circumflex and RCA disease.  Nuclear stress test in 2016 with an LVEF 58% and normal perfusion.  Echocardiogram April 2019 showed normal LVEF, mild LVH, grade 1 DD, mild right atrial and right ventricular enlargement, mild TR.  She was ruled out for DVT by Dopplers November 2021.  She has history of increasing bilateral lower extremity swelling with increased fluid intake.  In March 2024 she underwent short course of increased Lasix  for lower extremity swelling.   Seen in clinic by Jon Hails, PA on 11/09/2023 for bilateral lower extremity swelling.  She noted PND, orthopnea, worsening DOE that was limiting her activity.  Her 80 mg Lasix  was switched to 40 mg torsemide .   Seen in the ED on 11/11/2023 for evaluation of chest pain.  She had a normal chest x-ray and a BNP of 20.  High sensitive troponin x2 was 10.  It was suggested that her leg swelling is secondary to venous stasis and not CHF.  Lab work does show possible dehydration given elevated creatinine of 1.44 and GFR 37.   Seen in clinic on 11/20/2023.  Her SOB, DOE, orthopnea have improved however SOB was ongoing.  She also noted to have some claudication.  Echocardiogram was ordered and completed on 12/29/2023 showing LVEF 55-60%, no RWMA, RV function and size normal, normal PASP, biatrial size moderately dilated, no valvular abnormalities, mild  dilation of ascending aorta measuring 41 mm.  ABIs 12/18/2023 were normal.   Patient was last seen in clinic on 02/08/2024.  The patient's leg swelling has improved.  She had noted some epigastric/substernal pain particularly after eating.  The pain occurred between 7 and 8:00 each night approximately 1 hour after eating dinner.  The pain was relieved by Tums and sitting upward.  She was to follow-up with PCP regarding GERD.  She was completing water  aerobics and other activities without any exertional symptoms.  She was euvolemic and well compensated on exam.  She has a follow-up in 6 months.   Recently seen by PCP.  She was noted to be bradycardic on exam.  She was to follow-up with cardiology.  She was seen for follow-up for bradycardia on 05/02/2024.  Patient with long history of bradycardia dating back to when she first established with cardiology service in 2013.  She denied any new or acute symptoms and was asymptomatic.  No further interventions were made.  ZIO was ordered showing minimum heart rate 36 bpm, maximal heart rate 99 bpm, and average heart rate 55 bpm.  She was last seen in office on 05/23/2024 by Dr. Kate.  She called on 6/21 and reported worsening swelling in her lower extremities.  It was recommended to increase her torsemide  to 80 mg daily.  During office visit she reported starting to have acute numbness and weakness in the left side of her body.  She was resumed on torsemide   40 mg daily.  She was referred to the ED given numbness/weakness.  While in the ED she underwent CT head showing no acute intracranial process and MRI of the brain showing no evidence of acute intracranial abnormality.  Weakness was suspected to come from patient's arthritis.  She was stable and discharged home.   Discussed the use of AI scribe software for clinical note transcription with the patient, who gave verbal consent to proceed.  History of Present Illness [name] is an 83 year old female who  presents for a cardiology follow-up.  Today she is doing well overall.  She is without any acute cardiovascular concerns or complaints today.  Reports this is the best she has felt in quite some time. Chronic peripheral edema is present and well-controlled on torsemide .Chronic venous insufficiency contributes to long-standing fluid accumulation in her legs. She uses compression stockings to manage the condition.  She reports no episodes of low heart rates at home.  She denies any lightheadedness, dizziness, syncope, presyncope.  She continues to exercise at home and hopes to get back into the pool soon.  She is without any chest pains or exertional symptoms.  She does continue to have some left-sided weakness and follows up with neurology next month.   Review of systems:  Please see the history of present illness. All other systems are reviewed and otherwise negative.      Studies Reviewed:        ZIO 05/03/2024 Patch Wear Time:  6 days and 21 hours (2025-06-06T13:40:36-0400 to 2025-06-13T11:11:44-0400)   Patient had a min HR of 36 bpm, max HR of 99 bpm, and avg HR of 55 bpm. Predominant underlying rhythm was Sinus Rhythm. Isolated SVEs were rare (<1.0%), SVE Couplets were rare (<1.0%), and SVE Triplets were rare (<1.0%). Isolated VEs were rare (<1.0%),  and no VE Couplets or VE Triplets were present.    Sinus bradycardia (min HR 36), NSR, occasional PAC and rare PVC  Echocardiogram 12/29/2023  1. Left ventricular ejection fraction, by estimation, is 55 to 60%. The  left ventricle has normal function. The left ventricle has no regional  wall motion abnormalities. Left ventricular diastolic parameters are  indeterminate.   2. Right ventricular systolic function is normal. The right ventricular  size is normal. There is normal pulmonary artery systolic pressure.   3. Left atrial size was moderately dilated.   4. Right atrial size was moderately dilated.   5. The mitral valve is normal in  structure. No evidence of mitral valve  regurgitation. No evidence of mitral stenosis.   6. The aortic valve is tricuspid. Aortic valve regurgitation is not  visualized. No aortic stenosis is present.   7. Aortic dilatation noted. There is mild dilatation of the ascending  aorta, measuring 41 mm.   8. The inferior vena cava is normal in size with greater than 50%  respiratory variability, suggesting right atrial pressure of 3 mmHg.   VAS US  ABI 12/18/2023 Right: Resting right ankle-brachial index is within normal range. The  right toe-brachial index is normal.   Left: Resting left ankle-brachial index is within normal range. The left  toe-brachial index is normal.  Risk Assessment/Calculations:              Physical Exam:   VS:  BP 128/78 (BP Location: Left Arm, Patient Position: Sitting, Cuff Size: Normal)   Pulse (!) 58   Ht 4' 10 (1.473 m)   Wt 198 lb (89.8 kg)   SpO2 92%  BMI 41.38 kg/m    Wt Readings from Last 3 Encounters:  08/12/24 198 lb (89.8 kg)  06/21/24 203 lb 9.6 oz (92.4 kg)  06/16/24 200 lb (90.7 kg)    GEN: Well nourished, well developed in no acute distress NECK: No JVD; No carotid bruits CARDIAC: RRR, no murmurs, rubs, gallops RESPIRATORY:  Clear to auscultation without rales, wheezing or rhonchi  ABDOMEN: Soft, non-tender, non-distended EXTREMITIES:  No edema; No acute deformity      Assessment and Plan:  Bradycardia ZIO 05/2024 showed minimum HR 36 bpm, max HR 99 bpm, average HR 55 bpm.  No arrhythmias, pauses, or blocks - Heart rate today is 58 bpm.  She is without syncope, presyncope, lightheadedness or dizziness - Stable. Avoid AV nodal blocking agents   HFpEF Echocardiogram 12/2023 with LVEF 55-60%, no RWMA, RV normal, no valvular abnormalities - Today she appears euvolemic and well compensated on exam.  She is not volume overloaded on exam.  Lungs CTAB.  NYHA class II - No SOB, DOE, orthopnea, PND.  Chronic LEE well-controlled - She exercises  at home without limitation or exertional symptoms.  She hopes to get back to water  aerobics soon - Continue leg elevation, sodium restriction, lower extremity stockings - Continue torsemide  40 mg daily and potassium chloride  20 mEq twice daily   Coronary artery disease Hyperlipidemia  Nonobstructive disease by heart catheterization in 2005.  Reassuring and low risk nuclear stress test in 2016 Echocardiogram 12/2023 with LVEF 55 to 60%, no RWMA LDL 62 on 09/2023 and well-controlled - She is without anginal symptoms, no indication of further ischemic evaluation at this time - Continue aspirin  81 mg daily and rosuvastatin  40 mg   Hypertension  BP today 128/78 and under excellent control - Continue current antihypertensive regimen   Ascending aorta dilation Echocardiogram 12/2023 showing mild dilation of the ascending aorta measuring 41 mm - Consider repeating echocardiogram in 1 year for routine monitoring  Numbness/weakness Reports left-sided numbness and weakness since 05/2024 She has had unremarkable CT head and MRI brain - Scheduled with neurology on 08/2024      Dispo:  Return in about 6 months (around 02/09/2025).  Signed, Lum LITTIE Louis, NP

## 2024-08-12 NOTE — Patient Instructions (Signed)
 Medication Instructions:  NO CHANAGES  Lab Work: NONE TO BE DONE TODAY.  Testing/Procedures: NONE  Follow-Up: At [facility], you and your health needs are our priority.  As part of our continuing mission to provide you with exceptional heart care, our providers are all part of one team.  This team includes your primary Cardiologist (physician) and Advanced Practice Providers or APPs (Physician Assistants and Nurse Practitioners) who all work together to provide you with the care you need, when you need it.  Your next appointment:   6 MONTHS  Provider:   Redell Shallow, MD OR Lum Louis, NP

## 2024-08-14 NOTE — Patient Instructions (Addendum)
  1. Treat and prevent inflammation of airway:   A. Breztri  - 2 inhalations 2 times per day (empty lungs). Rinse mouth out  after. Sample given  B. Fluticasone  - 1 spray each nostril 2 times per day  C. Montelukast  10 mg daily  D. Avoid allergens including pollen, cat, and dog  2. Treat and prevent reflux:   A. Continue pantoprazole  40 mg - 1 tablet in AM  B. Continue  famotidine  40 mg - 1 tablet in PM  C. Eliminate caffeine / chocolate consumption  3. If needed:   A. Albuterol  - 2 inhalations every 4-6 hours  B. Loratadine  10 - 1 tablet 1 time per day  C. Nasal saline rinses  4. Follow up with your ENT for evaluation of painful swallowing and hoarseness on 08/31/2024  5.  Your heart rate in the office today is low.  When we rechecked your heart rate was 42 bpm.  She denies any dizziness or lightheadedness today, but mentions that she does have dizziness and lightheadedness at times.  Recommend that you schedule an appointment with your cardiologist to discuss the dizziness and lightheadedness that you have at times.  Return to clinic in 3 months or earlier if problem for follow up with Dr. Kozolw

## 2024-08-15 ENCOUNTER — Encounter: Payer: Self-pay | Admitting: Family

## 2024-08-15 ENCOUNTER — Ambulatory Visit (INDEPENDENT_AMBULATORY_CARE_PROVIDER_SITE_OTHER): Admitting: Family

## 2024-08-15 ENCOUNTER — Other Ambulatory Visit: Payer: Self-pay

## 2024-08-15 VITALS — BP 112/70 | HR 42 | Temp 97.7°F

## 2024-08-15 DIAGNOSIS — Z8669 Personal history of other diseases of the nervous system and sense organs: Secondary | ICD-10-CM

## 2024-08-15 DIAGNOSIS — J3089 Other allergic rhinitis: Secondary | ICD-10-CM

## 2024-08-15 DIAGNOSIS — R490 Dysphonia: Secondary | ICD-10-CM

## 2024-08-15 DIAGNOSIS — Z87891 Personal history of nicotine dependence: Secondary | ICD-10-CM | POA: Diagnosis not present

## 2024-08-15 DIAGNOSIS — R1310 Dysphagia, unspecified: Secondary | ICD-10-CM

## 2024-08-15 DIAGNOSIS — K219 Gastro-esophageal reflux disease without esophagitis: Secondary | ICD-10-CM

## 2024-08-15 DIAGNOSIS — J302 Other seasonal allergic rhinitis: Secondary | ICD-10-CM

## 2024-08-15 DIAGNOSIS — J4540 Moderate persistent asthma, uncomplicated: Secondary | ICD-10-CM

## 2024-08-15 NOTE — Progress Notes (Signed)
 [address] [place] [address] Dept: [phone]  FOLLOW UP NOTE  Patient ID: [name], female    DOB: 1941-08-13  Age: 83 y.o. MRN: [id number] Date of Office Visit: 08/15/2024  Assessment  Chief Complaint: Follow-up (Asthma /Allergies/C/o runny nose 2 weeks nasale spray for symptoms )  HPI [name] is an 83 year old female who presents today for follow-up of well-controlled moderate persistent asthma, painful swallowing, laryngopharyngeal reflux disease, history of smoking 20 to 50 pack years and seasonal and perennial allergic rhinitis.  She was last seen on July 18, 2024 by Arlean Mutter, FNP .  She denies any new diagnosis or surgery since her last office visit.  Moderate persistent asthma: She reports a little bit of a dry cough every now and then and denies wheezing, tightness in chest, shortness of breath, and nocturnal awakenings due to breathing problems.  Since her last office visit she has received a steroid injection for her back.  She reports that she is taking Breztri  2 puffs twice a day every day and at times it can be pricey.  The last time it was $30.  She is using her albuterol  approximately once a week.    Painful swallowing/hoarseness: She reports that off-and-on she will have painful swallowing and hoarseness.  She does have an appointment with ear nose and throat in October.  She feels that the painful swallowing and hoarseness depends on what she is eating.  She has also noticed that capsules can cause this.  Laryngopharyngeal reflux disease: She reports her symptoms have been all right and she has not had any symptoms lately.  She continues to take pantoprazole  40 mg in the morning and Pepcid  40 mg at night.  Seasonal and perennial allergic rhinitis: She reports clear rhinorrhea for the past few days and reports that nasal congestion and postnasal drip are no more than usual.  She has not been treated for any sinus infections since we last saw her.  She is  currently taking Singulair  10 mg daily and using Flonase  nasal spray daily.   Drug Allergies:  No Known Allergies  Review of Systems: Negative except as per HPI   Physical Exam: BP 112/70   Pulse (!) 42   Temp 97.7 F (36.5 C)   SpO2 96%    Physical Exam Constitutional:      Appearance: Normal appearance.  HENT:     Head: Normocephalic and atraumatic.     Comments: Pharynx normal, eyes normal, ears normal, nose normal    Right Ear: Tympanic membrane, ear canal and external ear normal.     Left Ear: Tympanic membrane, ear canal and external ear normal.     Nose: Nose normal.     Mouth/Throat:     Mouth: Mucous membranes are moist.     Pharynx: Oropharynx is clear.  Eyes:     Conjunctiva/sclera: Conjunctivae normal.  Cardiovascular:     Rate and Rhythm: Regular rhythm.     Heart sounds: Normal heart sounds.  Pulmonary:     Effort: Pulmonary effort is normal.     Breath sounds: Normal breath sounds.     Comments: Lungs clear to auscultation Musculoskeletal:     Cervical back: Neck supple.  Skin:    General: Skin is warm.  Neurological:     Mental Status: She is alert and oriented to person, place, and time.  Psychiatric:        Mood and Affect: Mood normal.  Behavior: Behavior normal.        Thought Content: Thought content normal.        Judgment: Judgment normal.     Diagnostics:  None. Will get spirometry at next office visit  Assessment and Plan: 1. Asthma, moderate persistent, well-controlled   2. Seasonal and perennial allergic rhinitis   3. LPRD (laryngopharyngeal reflux disease)   4. Painful swallowing   5. Hoarseness   6. History of smoking 25-50 pack years   7. History of Chiari malformation     No orders of the defined types were placed in this encounter.   Patient Instructions   1. Treat and prevent inflammation of airway:   A. Breztri  - 2 inhalations 2 times per day (empty lungs). Rinse mouth out  after. Sample given  B.  Fluticasone  - 1 spray each nostril 2 times per day  C. Montelukast  10 mg daily  D. Avoid allergens including pollen, cat, and dog  2. Treat and prevent reflux:   A. Continue pantoprazole  40 mg - 1 tablet in AM  B. Continue  famotidine  40 mg - 1 tablet in PM  C. Eliminate caffeine / chocolate consumption  3. If needed:   A. Albuterol  - 2 inhalations every 4-6 hours  B. Loratadine  10 - 1 tablet 1 time per day  C. Nasal saline rinses  4. Follow up with your ENT for evaluation of painful swallowing and hoarseness on 08/31/2024  5.  Your heart rate in the office today is low.  When we rechecked your heart rate was 42 bpm.  She denies any dizziness or lightheadedness today, but mentions that she does have dizziness and lightheadedness at times.  Recommend that you schedule an appointment with your cardiologist to discuss the dizziness and lightheadedness that you have at times.  Return to clinic in 3 months or earlier if problem for follow up with Dr. Kozolw    Return in about 3 months (around 11/14/2024), or if symptoms worsen or fail to improve.    Thank you for the opportunity to care for this patient.  Please do not hesitate to contact me with questions.  Wanda Craze, FNP [facility] 

## 2024-08-20 ENCOUNTER — Other Ambulatory Visit: Payer: Self-pay | Admitting: Cardiology

## 2024-08-20 DIAGNOSIS — R600 Localized edema: Secondary | ICD-10-CM

## 2024-08-22 ENCOUNTER — Other Ambulatory Visit (HOSPITAL_COMMUNITY): Payer: Self-pay

## 2024-08-22 ENCOUNTER — Other Ambulatory Visit: Payer: Self-pay

## 2024-08-22 DIAGNOSIS — R600 Localized edema: Secondary | ICD-10-CM

## 2024-08-22 MED ORDER — POTASSIUM CHLORIDE CRYS ER 20 MEQ PO TBCR
20.0000 meq | EXTENDED_RELEASE_TABLET | Freq: Two times a day (BID) | ORAL | 3 refills | Status: AC
  Filled 2024-08-22: qty 180, 90d supply, fill #0

## 2024-08-26 ENCOUNTER — Other Ambulatory Visit (HOSPITAL_COMMUNITY): Payer: Self-pay

## 2024-08-30 DIAGNOSIS — F339 Major depressive disorder, recurrent, unspecified: Secondary | ICD-10-CM | POA: Diagnosis not present

## 2024-08-30 DIAGNOSIS — I2510 Atherosclerotic heart disease of native coronary artery without angina pectoris: Secondary | ICD-10-CM | POA: Diagnosis not present

## 2024-08-30 DIAGNOSIS — M810 Age-related osteoporosis without current pathological fracture: Secondary | ICD-10-CM | POA: Diagnosis not present

## 2024-08-31 ENCOUNTER — Encounter (INDEPENDENT_AMBULATORY_CARE_PROVIDER_SITE_OTHER): Payer: Self-pay | Admitting: Otolaryngology

## 2024-08-31 ENCOUNTER — Ambulatory Visit (INDEPENDENT_AMBULATORY_CARE_PROVIDER_SITE_OTHER): Admitting: Otolaryngology

## 2024-08-31 VITALS — BP 124/69 | HR 48 | Temp 98.4°F

## 2024-08-31 DIAGNOSIS — R1310 Dysphagia, unspecified: Secondary | ICD-10-CM | POA: Diagnosis not present

## 2024-08-31 DIAGNOSIS — R0982 Postnasal drip: Secondary | ICD-10-CM

## 2024-08-31 DIAGNOSIS — K219 Gastro-esophageal reflux disease without esophagitis: Secondary | ICD-10-CM | POA: Diagnosis not present

## 2024-08-31 DIAGNOSIS — J381 Polyp of vocal cord and larynx: Secondary | ICD-10-CM

## 2024-08-31 DIAGNOSIS — R490 Dysphonia: Secondary | ICD-10-CM

## 2024-08-31 DIAGNOSIS — R0981 Nasal congestion: Secondary | ICD-10-CM

## 2024-08-31 MED ORDER — FLUTICASONE PROPIONATE 50 MCG/ACT NA SUSP: 2.0000 | Freq: Two times a day (BID) | NASAL | 6 refills | Status: DC

## 2024-08-31 NOTE — Patient Instructions (Signed)

## 2024-08-31 NOTE — Progress Notes (Signed)
 ENT CONSULT:  Reason for Consult: dysphagia and hoarseness   HPI: Discussed the use of AI scribe software for clinical note transcription with the patient, who gave verbal consent to proceed.  History of Present Illness [name] is an 83 year old female with a history of hiatal hernia on EGD 20 yrs ago, who presents with difficulty swallowing.  She has been experiencing difficulty swallowing for the past three months, describing it as a sensation of food not wanting to go down, particularly when taking medication. This occurs most of the time but not always. No recent illness preceded the onset of these symptoms.  A swallow study conducted in August was normal, but it only assessed the upper portion of her swallow. An MRI of the brain was also normal, though there are some changes noted in the cervical spine.  She has a history of reflux and heartburn for which she is taking medication, and she reports that it helps. She has previously seen a GI doctor and underwent an upper endoscopy in 2005, which revealed a hiatal hernia.  She experiences hoarseness from time to time as well.  Records Reviewed:  Allergy and Asthma visit 7/33/25 Dr Kozlow She has apparently been receiving immunotherapy from Dr. Altamease for the past several years in the treatment of what sounds like asthma and allergic rhinitis.  Because of an insurance issue she can no longer see Dr. Altamease.   Her big complaint today is that she has developed hoarseness and painful swallowing and sometimes it is difficult to swallow.  Food sometimes gets hung up in her throat and she needs to cough it up.  This has been going on for the past 3 months or so.   She has a history of asthma in the context of smoking from the age of 52 to 82 and it seems as though she uses Breztri  on a consistent basis and occasionally uses a Symbicort  as a rescue inhaler.  She is a little confused about which inhaler she uses at what point in time.    She does get stuffy as well and she has not been using her nasal spray over the course of the past few months.  She does not have any anosmia.   She does have reflux disease that is still active with regurgitation even though she has been using some type of medicine for her reflux on a daily basis.  She occasionally drinks caffeine and occasionally eats chocolate.   She has a history of Arnold Chiari malformation.  She has a history of congestive heart failure.  She is having some lower abdominal pain which is currently being evaluated with a CT scan scheduled for this week.  1. Treat and prevent inflammation of airway:               A. Breztri  - 2 inhalations 2 times per day (empty lungs)             B. Fluticasone  - 1 spray each nostril 2 times per day   2. Treat and prevent reflux:               A. Start pantoprazole  40 mg - 1 tablet in AM             B. Start famotidine  40 mg - 1 tablet in PM             C. Eliminate caffeine / chocolate consumption   3. If needed:  A. Albuterol  - 2 inhalations every 4-6 hours             B. Loratadine  10 - 1 tablet 1 time per day    Past Medical History:  Diagnosis Date   Allergic rhinitis    Arnold-Chiari malformation (HCC)    Asthma    CAD (coronary artery disease)    COPD (chronic obstructive pulmonary disease) (HCC)    Coughing    coughing since last 01-22-2019 , started on cefdinir bid x10 days, has 3 pills left today , reports she feels mcuh better , still coughing copiuus amonts of thick white sputum , denies fever nor chills, nor body aches .    DJD (degenerative joint disease)    GERD (gastroesophageal reflux disease)    Hiatal hernia    History of absence seizures    last confirmed seizure age 19     Hx of colonic polyps    Hypercholesteremia    Hypertension    Obesity    Positive PPD    Reflex sympathetic dystrophy    Sleep apnea    Varicose veins    Venous insufficiency     Past Surgical History:   Procedure Laterality Date   ABDOMINAL HYSTERECTOMY     CHOLECYSTECTOMY N/A 02/19/2023   Procedure: LAPAROSCOPIC CHOLECYSTECTOMY WITH INTRAOPERATIVE CHOLANGIOGRAM AND ICG DYE;  Surgeon: Tanda Locus, MD;  Location: [facility] OR;  Service: General;  Laterality: N/A;   cspine surgery  12/02/1991   for arnold-chiari malformation   IR BONE MARROW BIOPSY & ASPIRATION  01/28/2023   JOINT REPLACEMENT  06/05/2011   Left total knee replacement   right knee arthroscopy  12/02/2007   Dr. Duwayne   right total knee replacement  05/31/2008   Dr. Duwayne   TONSILLECTOMY     TOTAL KNEE REVISION Left 02/03/2019   Procedure: LEFT TOTAL KNEE REVISION;  Surgeon: Fidel Rogue, MD;  Location: [facility];  Service: Orthopedics;  Laterality: Left;    Family History  Problem Relation Age of Onset   Heart disease Mother    Stroke Mother    Stroke Father    Clotting disorder Father    Asthma Sister    Colon cancer Neg Hx    Stomach cancer Neg Hx     Social History:  reports that she quit smoking about 12 years ago. Her smoking use included cigarettes. She started smoking about 24 years ago. She has a 3.6 pack-year smoking history. She has never used smokeless tobacco. She reports that she does not drink alcohol  and does not use drugs.  Allergies: No Known Allergies  Medications: I have reviewed the patient's current medications.  The PMH, PSH, Medications, Allergies, and SH were reviewed and updated.  ROS: Constitutional: Negative for fever, weight loss and weight gain. Cardiovascular: Negative for chest pain and dyspnea on exertion. Respiratory: Is not experiencing shortness of breath at rest. Gastrointestinal: Negative for nausea and vomiting. Neurological: Negative for headaches. Psychiatric: The patient is not nervous/anxious  Blood pressure 124/69, pulse (!) 48, temperature 98.4 F (36.9 C), SpO2 92%. There is no height or weight on file to calculate BMI.  PHYSICAL EXAM:  Exam: General:  Well-developed, well-nourished Respiratory Respiratory effort: Equal inspiration and expiration without stridor Cardiovascular Peripheral Vascular: Warm extremities with equal color/perfusion Eyes: No nystagmus with equal extraocular motion bilaterally Neuro/Psych/Balance: Patient oriented to person, place, and time; Appropriate mood and affect; Gait is intact with no imbalance; Cranial nerves I-XII are intact Head and Face Inspection: Normocephalic and atraumatic  without mass or lesion Palpation: Facial skeleton intact without bony stepoffs Salivary Glands: No mass or tenderness Facial Strength: Facial motility symmetric and full bilaterally ENT Pinna: External ear intact and fully developed External canal: Canal is patent with intact skin Tympanic Membrane: Clear and mobile External Nose: No scar or anatomic deformity Internal Nose: Septum is straight. No polyp, or purulence. Mucosal edema and erythema present.  Bilateral inferior turbinate hypertrophy.  Lips, Teeth, and gums: Mucosa and teeth intact and viable TMJ: No pain to palpation with full mobility Oral cavity/oropharynx: No erythema or exudate, no lesions present Nasopharynx: No mass or lesion with intact mucosa Hypopharynx: Intact mucosa without pooling of secretions Larynx Glottic: Full true vocal cord mobility with two polyps R > L appear as hemorrhagic polyps Supraglottic: Normal appearing epiglottis and AE folds Interarytenoid Space: Moderate pachydermia&edema Subglottic Space: Patent without lesion or edema Neck Neck and Trachea: Midline trachea without mass or lesion Thyroid : No mass or nodularity Lymphatics: No lymphadenopathy  Procedure: Preoperative diagnosis: hoarseness and dysphagia  Postoperative diagnosis:   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 Indirect laryngoscopy view was incomplete. Thus it was recommended that they undergo a flexible fiberoptic laryngoscopy. All of the risks, benefits, and potential complications were reviewed with the patient preoperatively and verbal informed consent was obtained.  Procedure: The patient was seated upright in the clinic. Topical lidocaine  and Afrin were applied to the nasal cavity. After adequate anesthesia had occurred, I then proceeded to pass the flexible telescope into the nasal cavity. The nasal cavity was patent without rhinorrhea or polyp. The nasopharynx was also patent without mass or lesion. The base of tongue was visualized and was normal. There were no signs of pooling of secretions in the piriform sinuses. The true vocal folds were mobile bilaterally. There were two hemorrhagic polyps larger one on the right side. 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 The telescope was then slowly withdrawn and the patient tolerated the procedure throughout.   Studies Reviewed: MBS 07/08/24 Clinical Impression: Pt presents with functional oropharyngeal swallow with normal oral phase, normal propulsion of materials through pharynx with no residue post-swallow, no aspiration, transient and trace penetration of thin liquids into the larynx without descent to the vocal folds and often ejected upon completion of the swallow (PAS score of 2 and 3).  Pt was asymptomatic during study and physiology of swallow was quite functional. Reviewed study in real time with pt and we discussed absence of any remarkable findings.    Recommend she continue with regular solids; thin liquids.  She has a pending consult with ENT.  No further SLP f/u at this time. Hoarse vocal quality can be addressed by ENT and referral made for OP voice therapy with SLP should it be warranted.  MR brain  05/24/24 IMPRESSION: 1.  No evidence of an acute intracranial abnormality. 2. Chronic small vessel ischemic changes which are mild in the cerebral white matter and moderate in the pons. 3. Incompletely assessed cervical spondylosis. 4. 5 mm C2-C3 grade 1 anterolisthesis.  EGD 06/11/2004 Hiatal hernia no other findings   Assessment/Plan: Encounter Diagnoses  Name Primary?   Dysphagia, unspecified type Yes   Vocal cord polyp    Dysphonia    Chronic GERD    Hoarseness  Assessment and Plan Assessment & Plan Dysphagia Dysphagia for three months with solids and medications. Normal MBS swallow study. Possible esophageal dysmotility but esophagram was not done. EGD with hiatal hernia noted in 2005.  - Order esophagram to evaluate esophageal function. - will consider GI referral in the future   Dysphonia hoarseness  Intermittent dysphonia and hoarseness with flexible scope today revealing R > L hemorrhagic polyps - speech therapy referral for phonotrauma and hemorrhagic polyps noted on scope exam  - will consider removal, will evaluate with strobe next time she returns   Chronic nasal congestion and post-nasal drainage - continue with Flonase  -refill sent  - consider nasal saline rinses  GERD LPR - continue Protonix  40 mg daily  -  Reflux Gourmet after meals - diet and lifestyle changes to minimize GERD - Refer to [employer] blog for dietary and lifestyle modifications/reflux cook book  Thank you for allowing me to participate in the care of this patient. Please do not hesitate to contact me with any questions or concerns.   Elena Larry, MD Otolaryngology [facility] Phone: [phone] Fax: [phone]    08/31/2024, 2:33 PM

## 2024-09-07 ENCOUNTER — Encounter: Payer: Self-pay | Admitting: Neurology

## 2024-09-07 ENCOUNTER — Ambulatory Visit (INDEPENDENT_AMBULATORY_CARE_PROVIDER_SITE_OTHER): Admitting: Neurology

## 2024-09-07 VITALS — BP 130/79 | HR 73 | Resp 15 | Ht 60.0 in | Wt 200.5 lb

## 2024-09-07 DIAGNOSIS — R251 Tremor, unspecified: Secondary | ICD-10-CM | POA: Diagnosis not present

## 2024-09-07 DIAGNOSIS — R531 Weakness: Secondary | ICD-10-CM

## 2024-09-07 NOTE — Patient Instructions (Signed)
 Cervical spine MRI Continue to monitor symptoms  Continue to follow up with PCP  Return if worse

## 2024-09-07 NOTE — Progress Notes (Signed)
 [facility]  PATIENT: [name] DOB: 08-17-1941  REQUESTING CLINICIAN: Ellouise Richerd POUR, DO HISTORY FROM: Patient/Cousin and chart review  REASON FOR VISIT: Tremors, weakness    HISTORICAL  CHIEF COMPLAINT:  Chief Complaint  Patient presents with   Hospitalization Follow-up    Rm12, cousin present, NP/internal hospital referral for episode of L sided numbness and weakness: pt stated that she has bilateral weakness, and back pain lower region. Pt stated experience bilateral hand tremors as well    HISTORY OF PRESENT ILLNESS:  Discussed the use of AI scribe software for clinical note transcription with the patient, who gave verbal consent to proceed.  [name] is an 83 year old female with COPD, CAD, hyperlipidemia who presents with tremors and weakness. She was referred for evaluation of weakness noted back in June when she presented to the ED. At that time, her MRI Brain did not show any acute abnormalities.  She has been experiencing tremors for the past two to three months, significantly impacting her daily activities, including eating and holding objects. She struggles with getting food to her mouth and holding a glass, often resulting in dropping items. Her writing has deteriorated, which she describes as 'terrible'. The tremors persist throughout the day and worsen as the day progresses. Reports these symptoms are more evident at the end of the day.   She has a history of weakness, particularly on her left side, which led to a hospital visit in June. During that visit, an MRI of her brain was performed, which was normal, but it showed some arthritis in her neck. She has not experienced any falls recently but relies heavily on a walker due to wobbling and weakness.  She has arthritis, particularly in her back, for which she receives injections that provide relief for two to three days. She reports significant back pain. She has complete physical  therapy recently.   She has COPD and uses multiple inhalers: Ventolin  and Breztri  a twice daily, and Symbicort  as needed.  In terms of physical activity, she admits to limited movement, primarily getting up to use the bathroom and then sitting back down. She has been encouraged to walk around her house and neighborhood and to resume physical therapy exercises at home.    OTHER MEDICAL CONDITIONS: COPD, CAD, memory loss, hyperlipidemia    REVIEW OF SYSTEMS: Full 14 system review of systems performed and negative with exception of: As noted in the HPI   ALLERGIES: No Known Allergies  HOME MEDICATIONS: Outpatient Medications Prior to Visit  Medication Sig Dispense Refill   acetaminophen  (TYLENOL ) 500 MG tablet Take 1,000 mg by mouth every 6 (six) hours as needed for moderate pain.     albuterol  (VENTOLIN  HFA) 108 (90 Base) MCG/ACT inhaler Inhale 2 puffs into the lungs every 4 (four) hours as needed. For wheeze or shortness of breath 18 g 1   alendronate  (FOSAMAX ) 70 MG tablet Take 70 mg by mouth once a week.     aspirin  81 MG tablet Take 81 mg by mouth daily.     azelastine  (ASTELIN ) 0.1 % nasal spray Place 1 spray into both nostrils 2 (two) times daily as needed for rhinitis or allergies.     budesonide -formoterol  (SYMBICORT ) 80-4.5 MCG/ACT inhaler Inhale 2 puffs into the lungs 2 (two) times daily as needed (shortness of breath).     budesonide -glycopyrrolate -formoterol  (BREZTRI  AEROSPHERE) 160-9-4.8 MCG/ACT AERO inhaler Inhale 2 puffs into the lungs in the morning and at bedtime. 32.1 g  1   [employer] D3 50 MCG (2000 UT) CAPS Take 1 capsule by mouth daily.     cyanocobalamin  (VITAMIN B12) 1000 MCG tablet Take 1,000 mcg by mouth daily.     donepezil  (ARICEPT  ODT) 5 MG disintegrating tablet Take 1 tablet (5 mg total) by mouth at bedtime. 90 tablet 3   donepezil  (ARICEPT ) 5 MG tablet Take 5 mg by mouth at bedtime.     EPINEPHrine  0.3 mg/0.3 mL IJ SOAJ injection Inject 0.3 mg into the muscle as  needed for anaphylaxis.     famotidine  (PEPCID ) 40 MG tablet Take 1 tablet (40 mg total) by mouth at bedtime. 90 tablet 1   ferrous sulfate  325 (65 FE) MG EC tablet Take 325 mg by mouth daily.     fluticasone  (FLONASE ) 50 MCG/ACT nasal spray Place 1 spray into both nostrils in the morning and at bedtime. 48 g 1   fluticasone  (FLONASE ) 50 MCG/ACT nasal spray Place 2 sprays into both nostrils 2 (two) times daily. 16 g 6   lidocaine  (LIDODERM ) 5 % Place 1 patch onto the skin daily. Remove & Discard patch within 12 hours or as directed by MD (Patient taking differently: Place 1 patch onto the skin as needed (pain). Remove & Discard patch within 12 hours or as directed by MD) 7 patch 0   loperamide (IMODIUM A-D) 2 MG tablet Take 2 mg by mouth as needed for diarrhea or loose stools.     montelukast  (SINGULAIR ) 10 MG tablet Take 1 tablet (10 mg total) by mouth at bedtime. 30 tablet 5   mupirocin  cream (BACTROBAN ) 2 % Apply 1 Application topically 2 (two) times daily. 15 g 0   pantoprazole  (PROTONIX ) 40 MG tablet Take 1 tablet (40 mg total) by mouth every morning. TAKE 1 TABLET BY MOUTH EVERY DAY 90 tablet 1   potassium chloride  SA (KLOR-CON  M20) 20 MEQ tablet Take 1 tablet (20 mEq total) by mouth 2 (two) times daily. 180 tablet 3   povidone-iodine  (BETADINE ) 10 % external solution Paint  in webspace next to left great toe once daily 118 mL 0   rosuvastatin  (CRESTOR ) 40 MG tablet Take 1 tablet (40 mg total) by mouth daily. 90 tablet 3   Tiotropium Bromide Monohydrate (SPIRIVA RESPIMAT) 1.25 MCG/ACT AERS Inhale 1.25 mcg into the lungs daily.     tiZANidine  (ZANAFLEX ) 2 MG tablet Take 1 tablet (2 mg total) by mouth every 6 (six) hours as needed for muscle spasms. 40 tablet 1   Torsemide  40 MG TABS Take 1 tablet (40 mg) by mouth daily every morning (Patient taking differently: Per patient taking 4 tablets daily.) 30 tablet 11   traZODone  (DESYREL ) 100 MG tablet TAKE ONE TABLET BY MOUTH EVERYDAY AT BEDTIME 90  tablet 2   triamcinolone  cream (KENALOG ) 0.1 % Apply 1 Application topically as needed.     loratadine  (CLARITIN ) 10 MG tablet Take 1 tablet (10 mg total) by mouth daily as needed for allergies (Can take an extra dose during flare ups.). 180 tablet 1   lubiprostone  (AMITIZA ) 8 MCG capsule TAKE 1 CAPSULE (8 MCG TOTAL) BY MOUTH 2 (TWO) TIMES DAILY WITH A MEAL. 60 capsule 3   meloxicam  (MOBIC ) 15 MG tablet TAKE ONE TABLET BY MOUTH ONCE daily AS NEEDED FOR PAIN 30 tablet 2   Facility-Administered Medications Prior to Visit  Medication Dose Route Frequency Provider Last Rate Last Admin   Spy Agent Green / Firefly Optime  1.25 mg Intravenous Once Tanda Locus, MD  PAST MEDICAL HISTORY: Past Medical History:  Diagnosis Date   Allergic rhinitis    Arnold-Chiari malformation (HCC)    Asthma    CAD (coronary artery disease)    COPD (chronic obstructive pulmonary disease) (HCC)    Coughing    coughing since last 01-22-2019 , started on cefdinir bid x10 days, has 3 pills left today , reports she feels mcuh better , still coughing copiuus amonts of thick white sputum , denies fever nor chills, nor body aches .    DJD (degenerative joint disease)    GERD (gastroesophageal reflux disease)    Hiatal hernia    History of absence seizures    last confirmed seizure age 31     Hx of colonic polyps    Hypercholesteremia    Hypertension    Obesity    Positive PPD    Reflex sympathetic dystrophy    Sleep apnea    Varicose veins    Venous insufficiency     PAST SURGICAL HISTORY: Past Surgical History:  Procedure Laterality Date   ABDOMINAL HYSTERECTOMY     CHOLECYSTECTOMY N/A 02/19/2023   Procedure: LAPAROSCOPIC CHOLECYSTECTOMY WITH INTRAOPERATIVE CHOLANGIOGRAM AND ICG DYE;  Surgeon: Tanda Locus, MD;  Location: [facility] OR;  Service: General;  Laterality: N/A;   cspine surgery  12/02/1991   for arnold-chiari malformation   IR BONE MARROW BIOPSY & ASPIRATION  01/28/2023   JOINT REPLACEMENT   06/05/2011   Left total knee replacement   right knee arthroscopy  12/02/2007   Dr. Duwayne   right total knee replacement  05/31/2008   Dr. Duwayne   TONSILLECTOMY     TOTAL KNEE REVISION Left 02/03/2019   Procedure: LEFT TOTAL KNEE REVISION;  Surgeon: Fidel Rogue, MD;  Location: [facility] ORS;  Service: Orthopedics;  Laterality: Left;    FAMILY HISTORY: Family History  Problem Relation Age of Onset   Heart disease Mother    Stroke Mother    Stroke Father    Clotting disorder Father    Asthma Sister    Colon cancer Neg Hx    Stomach cancer Neg Hx     SOCIAL HISTORY: Social History   Socioeconomic History   Marital status: Widowed    Spouse name: Not on file   Number of children: 2   Years of education: 39   Highest education level: Not on file  Occupational History   Occupation: retired  Tobacco Use   Smoking status: Former    Current packs/day: 0.00    Average packs/day: 0.3 packs/day for 12.0 years (3.6 ttl pk-yrs)    Types: Cigarettes    Start date: 01/02/2000    Quit date: 01/02/2012    Years since quitting: 12.6   Smokeless tobacco: Never   Tobacco comments:    1 pack per week  Vaping Use   Vaping status: Never Used  Substance and Sexual Activity   Alcohol  use: No   Drug use: No   Sexual activity: Not Currently  Other Topics Concern   Not on file  Social History Narrative   Fun/Hobby: Playing cards and mingling.   Denies abuse and feels safe at home.    Social Drivers of Corporate investment banker Strain: Not on file  Food Insecurity: Not on file  Transportation Needs: Not on file  Physical Activity: Not on file  Stress: Not on file  Social Connections: Not on file  Intimate Partner Violence: Not on file    PHYSICAL EXAM  GENERAL EXAM/CONSTITUTIONAL: Vitals:  Vitals:   09/07/24 0803  BP: 130/79  Pulse: 73  Resp: 15  Weight: 200 lb 8 oz (90.9 kg)  Height: 5' (1.524 m)   Body mass index is 39.16 kg/m. Wt Readings from Last 3 Encounters:   09/07/24 200 lb 8 oz (90.9 kg)  08/12/24 198 lb (89.8 kg)  06/21/24 203 lb 9.6 oz (92.4 kg)   Patient is in no distress; well developed, nourished and groomed; neck is supple  MUSCULOSKELETAL: Gait, strength, tone, movements noted in Neurologic exam below  NEUROLOGIC: MENTAL STATUS:      No data to display         awake, alert, oriented to person, place and time recent and remote memory intact normal attention and concentration language fluent, comprehension intact, naming intact fund of knowledge appropriate  CRANIAL NERVE:  2nd, 3rd, 4th, 6th - Visual fields full to confrontation, extraocular muscles intact, no nystagmus 5th - facial sensation symmetric 7th - facial strength symmetric 8th - hearing intact 9th - palate elevates symmetrically, uvula midline 11th - shoulder shrug symmetric 12th - tongue protrusion midline  MOTOR:  normal bulk and tone, full strength in the BUE, BLE  SENSORY:  normal and symmetric to light touch  COORDINATION:  finger-nose-finger, fine finger movements normal with very mild action tremors. Normal Archimedes spiral test   REFLEXES:  deep tendon reflexes present and symmetric  GAIT/STATION:  Walks with a walker    DIAGNOSTIC DATA (LABS, IMAGING, TESTING) - I reviewed patient records, labs, notes, testing and imaging myself where available.  Lab Results  Component Value Date   WBC 3.9 (L) 06/16/2024   HGB 12.2 06/16/2024   HCT 38.3 06/16/2024   MCV 82.4 06/16/2024   PLT 173.0 06/16/2024      Component Value Date/Time   NA 138 06/16/2024 1103   NA 141 05/02/2024 1533   K 3.5 06/16/2024 1103   CL 99 06/16/2024 1103   CO2 30 06/16/2024 1103   GLUCOSE 101 (H) 06/16/2024 1103   BUN 15 06/16/2024 1103   BUN 11 05/02/2024 1533   CREATININE 0.94 06/16/2024 1103   CREATININE 0.79 12/20/2022 1204   CALCIUM  9.6 06/16/2024 1103   PROT 7.1 06/16/2024 1103   PROT 6.9 11/07/2019 1605   ALBUMIN 3.9 06/16/2024 1103   ALBUMIN 4.2  11/07/2019 1605   AST 19 06/16/2024 1103   AST 15 12/20/2022 1204   ALT 12 06/16/2024 1103   ALT 7 12/20/2022 1204   ALKPHOS 79 06/16/2024 1103   BILITOT 0.4 06/16/2024 1103   BILITOT 0.4 12/20/2022 1204   GFRNONAA 39 (L) 05/23/2024 1745   GFRNONAA >60 12/20/2022 1204   GFRAA 84 11/07/2019 1605   Lab Results  Component Value Date   CHOL 151 06/18/2022   HDL 58.30 06/18/2022   LDLCALC 78 06/18/2022   TRIG 74.0 06/18/2022   CHOLHDL 3 06/18/2022   Lab Results  Component Value Date   HGBA1C 5.6 06/18/2022   Lab Results  Component Value Date   VITAMINB12 194 12/20/2022   Lab Results  Component Value Date   TSH 1.120 05/02/2024    MRI Brain 05/24/2024 1.  No evidence of an acute intracranial abnormality. 2. Chronic small vessel ischemic changes which are mild in the cerebral white matter and moderate in the pons. 3. Incompletely assessed cervical spondylosis. 4. 5 mm C2-C3 grade 1 anterolisthesis    ASSESSMENT AND PLAN  83 y.o. year old female with COPD, CAD, Hyperlipidemia who presents with tremors  Mild tremor, potentially exacerbated by inhaler use and caffeine. - Recommend caffeine avoidance. - Record episodes of severe tremor using a phone for further evaluation.  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 Possible cervical radiculopathy due to potential nerve compression, contributing to weakness and tremor. - Order MRI of the cervical spine to assess for severe arthritis or nerve compression.  Generalized weakness and gait instability Persistent generalized weakness and gait instability, requiring walker for ambulation. No recent falls. Inconsistent home exercise following previous physical therapy. - Encourage resumption of physical therapy exercises at home. - Advise walking for 20 minutes daily to improve strength and stability. - Recommend returning to water  aerobics at the [facility].    1. Tremor    2. Weakness      Patient Instructions  Cervical spine MRI Continue to monitor symptoms  Continue to follow up with PCP  Return if worse   Orders Placed This Encounter  Procedures   MR CERVICAL SPINE WO CONTRAST    No orders of the defined types were placed in this encounter.   Return if symptoms worsen or fail to improve.    Pastor Falling, MD 09/07/2024, 8:45 AM  [facility] [address], [address] [place], [place] [address] [phone]

## 2024-09-09 ENCOUNTER — Telehealth: Payer: Self-pay | Admitting: Neurology

## 2024-09-09 NOTE — Telephone Encounter (Signed)
 [employer] auth: [id number] exp. 09/09/24-11/09/24  Scheduled at [facility] [phone]

## 2024-09-26 ENCOUNTER — Ambulatory Visit
Admission: RE | Admit: 2024-09-26 | Discharge: 2024-09-26 | Disposition: A | Source: Ambulatory Visit | Attending: Neurology

## 2024-09-26 DIAGNOSIS — R251 Tremor, unspecified: Secondary | ICD-10-CM | POA: Diagnosis not present

## 2024-09-26 DIAGNOSIS — R531 Weakness: Secondary | ICD-10-CM | POA: Diagnosis not present

## 2024-09-27 ENCOUNTER — Ambulatory Visit: Admitting: Podiatry

## 2024-09-27 DIAGNOSIS — M79674 Pain in right toe(s): Secondary | ICD-10-CM

## 2024-09-27 DIAGNOSIS — Z89422 Acquired absence of other left toe(s): Secondary | ICD-10-CM

## 2024-09-27 DIAGNOSIS — B351 Tinea unguium: Secondary | ICD-10-CM

## 2024-09-27 DIAGNOSIS — M79675 Pain in left toe(s): Secondary | ICD-10-CM

## 2024-09-27 DIAGNOSIS — I739 Peripheral vascular disease, unspecified: Secondary | ICD-10-CM | POA: Diagnosis not present

## 2024-09-30 ENCOUNTER — Other Ambulatory Visit: Payer: Self-pay | Admitting: Neurology

## 2024-09-30 ENCOUNTER — Ambulatory Visit: Payer: Self-pay | Admitting: Neurology

## 2024-09-30 DIAGNOSIS — R531 Weakness: Secondary | ICD-10-CM

## 2024-09-30 DIAGNOSIS — M542 Cervicalgia: Secondary | ICD-10-CM

## 2024-09-30 NOTE — Progress Notes (Signed)
 Please call and advise the patient that the recent cervical MRI showed severe arthritis causing narrowing of the spinal canal and nerve root exit. Please inform patient that I will refer her to physical therapy and if she continues to experience neck pain or arms weakness, to inform us , at that time, we will consider a surgical evaluation. Please remind patient to keep any upcoming appointments or tests and to call us  with any interim questions, concerns, problems or updates. Thanks,   Pastor Falling, MD

## 2024-10-02 ENCOUNTER — Encounter: Payer: Self-pay | Admitting: Podiatry

## 2024-10-02 NOTE — Progress Notes (Signed)
  Subjective:  Patient ID: [name], female    DOB: [birth year],  MRN: [id number]  [name] presents to clinic today for at risk foot care. Patient has h/o PAD with h/o amputation(s) of left second digit and painful mycotic toenails of both feet that are difficult to trim. Pain interferes with daily activities and wearing enclosed shoe gear comfortably.  Chief Complaint  Patient presents with   RFC     RFC Non diabetic toenail trim. LOV with PCP 07/07/24.   New problem(s): None.   PCP is [name], MD.  No Known Allergies  Review of Systems: Negative except as noted in the HPI.  Objective: No changes noted in today's physical examination. There were no vitals filed for this visit. [name] is a pleasant 83 y.o. female in NAD. AAO x 3.  Vascular Examination: Capillary refill time immediate b/l. Vascular status intact b/l with palpable DP pulses; faintly palpable PT pulses. Pedal hair absent b/l. No pain with calf compression b/l. Skin temperature gradient WNL b/l. No cyanosis or clubbing noted b/l. No ischemia or gangrene b/l. Trace edema noted BLE.  Neurological Examination: Sensation grossly intact b/l with 10 gram monofilament. Vibratory sensation intact b/l.   Dermatological Examination: Pedal skin with normal turgor, texture and tone b/l.  No open wounds. No interdigital macerations.   Toenails left great toe,  right 2nd digit, right 3rd toe and 4-5 b/l thick, discolored, elongated with subungual debris and pain on dorsal palpation.   Anonychia noted L 3rd toe and R hallux. Nailbed(s) epithelialized.    No hyperkeratotic nor porokeratotic lesions present on today's visit.  Musculoskeletal Examination: Muscle strength 5/5 to all lower extremity muscle groups bilaterally. Lower extremity amputation(s): digital amputation left second digit. Hallux valgus with bunion deformity noted b/l lower extremities left >right.  Radiographs: None  Assessment/Plan: 1.  Pain due to onychomycosis of toenails of both feet   2. Status post amputation of lesser toe of left foot   3. PVD (peripheral vascular disease)    Consent given for treatment. Patient examined. All patient's and/or POA's questions/concerns addressed on today's visit. Mycotic toenails  left hallux, right 2nd/3rd toes and 4-5 b/l debrided in length and girth without incident. Continue soft, supportive shoe gear daily. Report any pedal injuries to medical professional. Call office if there are any quesitons/concerns. -Patient/POA to call should there be question/concern in the interim.   Return in about 3 months (around 12/28/2024).  Delon LITTIE Merlin, DPM      [facility] LOCATION: [address], [place] [address]                   Office [phone]   [facility] LOCATION: [address] [place], [place] [address] Office [phone]

## 2024-10-03 ENCOUNTER — Telehealth: Payer: Self-pay | Admitting: Neurology

## 2024-10-03 NOTE — Telephone Encounter (Signed)
 Took call from phone room and spoke with pt. Relayed results per Dr. Janean note. Pt verbalized understanding. She will look out for a call from PT to get scheduled.

## 2024-10-03 NOTE — Telephone Encounter (Signed)
Referral for physical therapy sent through [facility] to [facility]. Phone: [phone].

## 2024-10-06 ENCOUNTER — Telehealth: Payer: Self-pay | Admitting: Cardiology

## 2024-10-06 NOTE — Telephone Encounter (Signed)
 Call to patient to discuss concerns. 2 identifiers utilized. Patient reports increasing weights and worsening leg swelling. She states her weight is 204 lbs today. Her last OV with Lum Louis was 08/12/24 and she was 198 lbs at that time. Note documents stable chronic leg edema at that time. Patient states she has been doubling up on her torsemide  with no results. She denies SOB and does not check her BP or HR at home. Made DOD appt for tomorrow 10/07/24, discussed ED precautions. Patient verbalizes understanding to call 911 or go to ED if SOB develops.

## 2024-10-06 NOTE — Telephone Encounter (Signed)
 Pt c/o swelling: STAT is pt has developed SOB within 24 hours  How much weight have you gained and in what time span?  Weight fluctuates up and down daily--per patient, one day she weighs about 196 lbs then the next day her weight will be around 204 lbs, then it will go back down   If swelling, where is the swelling located?  Feet and legs  Are you currently taking a fluid pill?  Patient says she takes Torsemide  but doesn't think it's helping  Are you currently SOB? No   Do you have a log of your daily weights (if so, list)?    Have you gained 3 pounds in a day or 5 pounds in a week?   Have you traveled recently?  No

## 2024-10-07 ENCOUNTER — Ambulatory Visit: Attending: Internal Medicine | Admitting: Internal Medicine

## 2024-10-07 ENCOUNTER — Encounter: Payer: Self-pay | Admitting: Internal Medicine

## 2024-10-07 VITALS — BP 106/74 | HR 55 | Ht <= 58 in | Wt 204.8 lb

## 2024-10-07 DIAGNOSIS — R600 Localized edema: Secondary | ICD-10-CM | POA: Diagnosis not present

## 2024-10-07 DIAGNOSIS — I5032 Chronic diastolic (congestive) heart failure: Secondary | ICD-10-CM

## 2024-10-07 DIAGNOSIS — R0683 Snoring: Secondary | ICD-10-CM

## 2024-10-07 NOTE — Patient Instructions (Signed)
 Medication Instructions:  Your physician recommends that you continue on your current medications as directed. Please refer to the Current Medication list given to you today.  *If you need a refill on your cardiac medications before your next appointment, please call your pharmacy*  Lab Work: TODAY: CMET, BNP If you have labs (blood work) drawn today and your tests are completely normal, you will receive your results only by: MyChart Message (if you have MyChart) OR A paper copy in the mail If you have any lab test that is abnormal or we need to change your treatment, we will call you to review the results.  Testing/Procedures: Your physician has recommended that you have a home sleep study. This test records several body functions during sleep, including: brain activity, eye movement, oxygen and carbon dioxide blood levels, heart rate and rhythm, breathing rate and rhythm, the flow of air through your mouth and nose, snoring, body muscle movements, and chest and belly movement.  Follow-Up: At [facility], you and your health needs are our priority.  As part of our continuing mission to provide you with exceptional heart care, our providers are all part of one team.  This team includes your primary Cardiologist (physician) and Advanced Practice Providers or APPs (Physician Assistants and Nurse Practitioners) who all work together to provide you with the care you need, when you need it.  Your next appointment:   4 month(s)  Provider:   Redell Shallow, MD or Lum Louis, NP      We recommend signing up for the patient portal called MyChart.  Sign up information is provided on this After Visit Summary.  MyChart is used to connect with patients for Virtual Visits (Telemedicine).  Patients are able to view lab/test results, encounter notes, upcoming appointments, etc.  Non-urgent messages can be sent to your provider as well.    To learn more about what you can do with MyChart,  go to [email or web address].

## 2024-10-07 NOTE — Progress Notes (Signed)
 Cardiology Office Note   Date:  10/07/2024   ID:  [name], DOB August 12, 1941, MRN [id number]  PCP:  [name], MD  Cardiologist: Pietro  Pt presents as add on to DOD schedule for evaluation of LE edema     History of Present Illness: [name] is a 83 y.o. female with a history ofHFpEF, CAD, HTN, HL, bradycardia      2005  LHC  Nonobstructive CAD 2016  Myoview   Low risk 2025  Zio patch  for bradycardia   Average HR 55 bpm    She was last seen by CHRISTELLA Louis on 08/12/24 Added in today as dod visit for increased LE edema She called in yesterday saying her wieght was up to 204 lbs  (up from 198  in September)  On phone she said edema stable  On Monday (4 days ago) she increased torsemide  to 80 mg Took M, T, Wed Thurs Today weight down to 203 lbs    Here in clinic she says swelling is up in legs   Says diet is the same  Not eating more salt Denies SOB   No CP  No palpitations         Current Meds  Medication Sig   acetaminophen  (TYLENOL ) 500 MG tablet Take 1,000 mg by mouth every 6 (six) hours as needed for moderate pain.   albuterol  (VENTOLIN  HFA) 108 (90 Base) MCG/ACT inhaler Inhale 2 puffs into the lungs every 4 (four) hours as needed. For wheeze or shortness of breath   alendronate  (FOSAMAX ) 70 MG tablet Take 70 mg by mouth once a week.   aspirin  81 MG tablet Take 81 mg by mouth daily.   azelastine  (ASTELIN ) 0.1 % nasal spray Place 1 spray into both nostrils 2 (two) times daily as needed for rhinitis or allergies.   budesonide -formoterol  (SYMBICORT ) 80-4.5 MCG/ACT inhaler Inhale 2 puffs into the lungs 2 (two) times daily as needed (shortness of breath).   budesonide -glycopyrrolate -formoterol  (BREZTRI  AEROSPHERE) 160-9-4.8 MCG/ACT AERO inhaler Inhale 2 puffs into the lungs in the morning and at bedtime.   [employer] D3 50 MCG (2000 UT) CAPS Take 1 capsule by mouth daily.   cyanocobalamin  (VITAMIN B12) 1000 MCG tablet Take 1,000 mcg by mouth daily.   donepezil   (ARICEPT ) 5 MG tablet Take 5 mg by mouth at bedtime.   EPINEPHrine  0.3 mg/0.3 mL IJ SOAJ injection Inject 0.3 mg into the muscle as needed for anaphylaxis.   famotidine  (PEPCID ) 40 MG tablet Take 1 tablet (40 mg total) by mouth at bedtime.   ferrous sulfate  325 (65 FE) MG EC tablet Take 325 mg by mouth daily.   fluticasone  (FLONASE ) 50 MCG/ACT nasal spray Place 1 spray into both nostrils in the morning and at bedtime.   lidocaine  (LIDODERM ) 5 % Place 1 patch onto the skin daily. Remove & Discard patch within 12 hours or as directed by MD (Patient taking differently: Place 1 patch onto the skin as needed (pain). Remove & Discard patch within 12 hours or as directed by MD)   loperamide (IMODIUM A-D) 2 MG tablet Take 2 mg by mouth as needed for diarrhea or loose stools.   montelukast  (SINGULAIR ) 10 MG tablet Take 1 tablet (10 mg total) by mouth at bedtime.   mupirocin  cream (BACTROBAN ) 2 % Apply 1 Application topically 2 (two) times daily.   pantoprazole  (PROTONIX ) 40 MG tablet Take 1 tablet (40 mg total) by mouth every morning. TAKE 1 TABLET BY MOUTH EVERY DAY  potassium chloride  SA (KLOR-CON  M20) 20 MEQ tablet Take 1 tablet (20 mEq total) by mouth 2 (two) times daily.   povidone-iodine  (BETADINE ) 10 % external solution Paint  in webspace next to left great toe once daily   rosuvastatin  (CRESTOR ) 40 MG tablet Take 1 tablet (40 mg total) by mouth daily.   Tiotropium Bromide Monohydrate (SPIRIVA RESPIMAT) 1.25 MCG/ACT AERS Inhale 1.25 mcg into the lungs daily.   tiZANidine  (ZANAFLEX ) 2 MG tablet Take 1 tablet (2 mg total) by mouth every 6 (six) hours as needed for muscle spasms.   Torsemide  40 MG TABS Take 1 tablet (40 mg) by mouth daily every morning (Patient taking differently: Per patient taking 4 tablets daily.)   traZODone  (DESYREL ) 100 MG tablet TAKE ONE TABLET BY MOUTH EVERYDAY AT BEDTIME   triamcinolone  cream (KENALOG ) 0.1 % Apply 1 Application topically as needed.     Allergies:   Patient  has no known allergies.   Past Medical History:  Diagnosis Date   Allergic rhinitis    Arnold-Chiari malformation (HCC)    Asthma    CAD (coronary artery disease)    COPD (chronic obstructive pulmonary disease) (HCC)    Coughing    coughing since last 01-22-2019 , started on cefdinir bid x10 days, has 3 pills left today , reports she feels mcuh better , still coughing copiuus amonts of thick white sputum , denies fever nor chills, nor body aches .    DJD (degenerative joint disease)    GERD (gastroesophageal reflux disease)    Hiatal hernia    History of absence seizures    last confirmed seizure age 24     Hx of colonic polyps    Hypercholesteremia    Hypertension    Obesity    Positive PPD    Reflex sympathetic dystrophy    Sleep apnea    Varicose veins    Venous insufficiency     Past Surgical History:  Procedure Laterality Date   ABDOMINAL HYSTERECTOMY     CHOLECYSTECTOMY N/A 02/19/2023   Procedure: LAPAROSCOPIC CHOLECYSTECTOMY WITH INTRAOPERATIVE CHOLANGIOGRAM AND ICG DYE;  Surgeon: Tanda Locus, MD;  Location: [facility] OR;  Service: General;  Laterality: N/A;   cspine surgery  12/02/1991   for arnold-chiari malformation   IR BONE MARROW BIOPSY & ASPIRATION  01/28/2023   JOINT REPLACEMENT  06/05/2011   Left total knee replacement   right knee arthroscopy  12/02/2007   Dr. Duwayne   right total knee replacement  05/31/2008   Dr. Duwayne   TONSILLECTOMY     TOTAL KNEE REVISION Left 02/03/2019   Procedure: LEFT TOTAL KNEE REVISION;  Surgeon: Fidel Rogue, MD;  Location: [facility] ORS;  Service: Orthopedics;  Laterality: Left;     Social History:  The patient  reports that she quit smoking about 12 years ago. Her smoking use included cigarettes. She started smoking about 24 years ago. She has a 3.6 pack-year smoking history. She has never used smokeless tobacco. She reports that she does not drink alcohol  and does not use drugs.   Family History:  The patient's family history  includes Asthma in her sister; Clotting disorder in her father; Heart disease in her mother; Stroke in her father and mother.    ROS:  Please see the history of present illness. All other systems are reviewed and  Negative to the above problem except as noted.    PHYSICAL EXAM: VS:  BP 106/74 (BP Location: Right Arm, Patient Position: Sitting, Cuff Size: Large)  Pulse (!) 55   Ht 4' 10 (1.473 m)   Wt 204 lb 12.8 oz (92.9 kg)   SpO2 94%   BMI 42.80 kg/m   GEN: Obese 83 yo in no acute distress  HEENT: normal  Neck: no JVD, carotid bruits, Cardiac: RRR; no murmurs,  Respiratory:  clear to auscultation  GI: soft, nontender,  No hepatomegaly  Ext  1+ edema  EKG:  EKG is not ordered today.   Lipid Panel    Component Value Date/Time   CHOL 151 06/18/2022 1143   CHOL 144 11/07/2019 1605   TRIG 74.0 06/18/2022 1143   HDL 58.30 06/18/2022 1143   HDL 68 11/07/2019 1605   CHOLHDL 3 06/18/2022 1143   VLDL 14.8 06/18/2022 1143   LDLCALC 78 06/18/2022 1143   LDLCALC 57 11/07/2019 1605      Wt Readings from Last 3 Encounters:  10/07/24 204 lb 12.8 oz (92.9 kg)  09/07/24 200 lb 8 oz (90.9 kg)  08/12/24 198 lb (89.8 kg)      ASSESSMENT AND PLAN:  1  Edema  Pt with chronic LE edema   She says it is increased  Otherwise exam without significant volume increase  Breathing is good, lungs CTA Echo in Jan 2025 LVEF normal  RVEF normal   WIll checkCMET and BNP   Keep on same meds for now   2  CAD  No symptoms of angina   3  HL   LDL in Oct 2024 was 62  Keep on statin  Follow up based on test results   Current medicines are reviewed at length with the patient today.  The patient does not have concerns regarding medicines.  Signed, Vina Gull, MD  10/07/2024 9:56 PM    [facility] [address] [place], [place], [place]  [address] Phone: [phone]; Fax: [phone]

## 2024-10-08 LAB — COMPREHENSIVE METABOLIC PANEL WITH GFR
ALT: 13 IU/L (ref 0–32)
AST: 22 IU/L (ref 0–40)
Albumin: 4 g/dL (ref 3.7–4.7)
Alkaline Phosphatase: 92 IU/L (ref 48–129)
BUN/Creatinine Ratio: 17 (ref 12–28)
BUN: 16 mg/dL (ref 8–27)
Bilirubin Total: 0.3 mg/dL (ref 0.0–1.2)
CO2: 21 mmol/L (ref 20–29)
Calcium: 9.3 mg/dL (ref 8.7–10.3)
Chloride: 103 mmol/L (ref 96–106)
Creatinine, Ser: 0.96 mg/dL (ref 0.57–1.00)
Globulin, Total: 2.7 g/dL (ref 1.5–4.5)
Glucose: 86 mg/dL (ref 70–99)
Potassium: 4.4 mmol/L (ref 3.5–5.2)
Sodium: 143 mmol/L (ref 134–144)
Total Protein: 6.7 g/dL (ref 6.0–8.5)
eGFR: 59 mL/min/1.73 — ABNORMAL LOW (ref >59)

## 2024-10-08 LAB — PRO B NATRIURETIC PEPTIDE: NT-Pro BNP: 115 pg/mL (ref 0–738)

## 2024-10-09 ENCOUNTER — Ambulatory Visit: Payer: Self-pay | Admitting: Internal Medicine

## 2024-10-11 ENCOUNTER — Other Ambulatory Visit (HOSPITAL_BASED_OUTPATIENT_CLINIC_OR_DEPARTMENT_OTHER): Payer: Self-pay | Admitting: Family Medicine

## 2024-10-11 DIAGNOSIS — M810 Age-related osteoporosis without current pathological fracture: Secondary | ICD-10-CM

## 2024-10-14 NOTE — Progress Notes (Deleted)
 Referring Physician:  Gregg Lek, MD [address] [place],  [place] [address]  Primary Physician:  [name], MD  History of Present Illness: 10/14/2024 [name] is here today with a chief complaint of ***   Neck pain, arm pain?  Bilateral arm weakness, numbness/tingling?  Any balance or gait issues?   Duration: *** Location: *** Quality: *** Severity: ***  Precipitating: aggravated by *** Modifying factors: made better by *** Weakness: none Timing: *** Bowel/Bladder Dysfunction: none  Conservative measures:  Physical therapy: *** referral was placed, she is scheduled for initial evaluation on 10/31/24 through [facility].  Multimodal medical therapy including regular antiinflammatories: *** tylenol  Injections: *** no epidural steroid injections  Past Surgery: ***no spinal surgeries  [name] has ***no symptoms of cervical myelopathy.  The symptoms are causing a significant impact on the patient's life.   I have utilized the care everywhere function in epic to review the outside records available from external health systems.   Review of Systems:  A 10 point review of systems is negative, except for the pertinent positives and negatives detailed in the HPI.  Past Medical History: Past Medical History:  Diagnosis Date   Allergic rhinitis    Arnold-Chiari malformation (HCC)    Asthma    CAD (coronary artery disease)    COPD (chronic obstructive pulmonary disease) (HCC)    Coughing    coughing since last 01-22-2019 , started on cefdinir bid x10 days, has 3 pills left today , reports she feels mcuh better , still coughing copiuus amonts of thick white sputum , denies fever nor chills, nor body aches .    DJD (degenerative joint disease)    GERD (gastroesophageal reflux disease)    Hiatal hernia    History of absence seizures    last confirmed seizure age 79     Hx of colonic polyps    Hypercholesteremia    Hypertension     Obesity    Positive PPD    Reflex sympathetic dystrophy    Sleep apnea    Varicose veins    Venous insufficiency     Past Surgical History: Past Surgical History:  Procedure Laterality Date   ABDOMINAL HYSTERECTOMY     CHOLECYSTECTOMY N/A 02/19/2023   Procedure: LAPAROSCOPIC CHOLECYSTECTOMY WITH INTRAOPERATIVE CHOLANGIOGRAM AND ICG DYE;  Surgeon: Tanda Locus, MD;  Location: [facility] OR;  Service: General;  Laterality: N/A;   cspine surgery  12/02/1991   for arnold-chiari malformation   IR BONE MARROW BIOPSY & ASPIRATION  01/28/2023   JOINT REPLACEMENT  06/05/2011   Left total knee replacement   right knee arthroscopy  12/02/2007   Dr. Duwayne   right total knee replacement  05/31/2008   Dr. Duwayne   TONSILLECTOMY     TOTAL KNEE REVISION Left 02/03/2019   Procedure: LEFT TOTAL KNEE REVISION;  Surgeon: Fidel Rogue, MD;  Location: [facility] OR[facility];  Service: Orthopedics;  Laterality: Left;    Allergies: Allergies as of 10/18/2024   (No Known Allergies)    Medications:  Current Outpatient Medications:    acetaminophen  (TYLENOL ) 500 MG tablet, Take 1,000 mg by mouth every 6 (six) hours as needed for moderate pain., Disp: , Rfl:    albuterol  (VENTOLIN  HFA) 108 (90 Base) MCG/ACT inhaler, Inhale 2 puffs into the lungs every 4 (four) hours as needed. For wheeze or shortness of breath, Disp: 18 g, Rfl: 1   alendronate  (FOSAMAX ) 70 MG tablet, Take 70 mg by mouth once a  week., Disp: , Rfl:    aspirin  81 MG tablet, Take 81 mg by mouth daily., Disp: , Rfl:    azelastine  (ASTELIN ) 0.1 % nasal spray, Place 1 spray into both nostrils 2 (two) times daily as needed for rhinitis or allergies., Disp: , Rfl:    budesonide -formoterol  (SYMBICORT ) 80-4.5 MCG/ACT inhaler, Inhale 2 puffs into the lungs 2 (two) times daily as needed (shortness of breath)., Disp: , Rfl:    budesonide -glycopyrrolate -formoterol  (BREZTRI  AEROSPHERE) 160-9-4.8 MCG/ACT AERO inhaler, Inhale 2 puffs into the lungs in the morning and at  bedtime., Disp: 32.1 g, Rfl: 1   CVS D3 50 MCG (2000 UT) CAPS, Take 1 capsule by mouth daily., Disp: , Rfl:    cyanocobalamin  (VITAMIN B12) 1000 MCG tablet, Take 1,000 mcg by mouth daily., Disp: , Rfl:    donepezil  (ARICEPT ) 5 MG tablet, Take 5 mg by mouth at bedtime., Disp: , Rfl:    EPINEPHrine  0.3 mg/0.3 mL IJ SOAJ injection, Inject 0.3 mg into the muscle as needed for anaphylaxis., Disp: , Rfl:    famotidine  (PEPCID ) 40 MG tablet, Take 1 tablet (40 mg total) by mouth at bedtime., Disp: 90 tablet, Rfl: 1   ferrous sulfate  325 (65 FE) MG EC tablet, Take 325 mg by mouth daily., Disp: , Rfl:    fluticasone  (FLONASE ) 50 MCG/ACT nasal spray, Place 1 spray into both nostrils in the morning and at bedtime., Disp: 48 g, Rfl: 1   lidocaine  (LIDODERM ) 5 %, Place 1 patch onto the skin daily. Remove & Discard patch within 12 hours or as directed by MD (Patient taking differently: Place 1 patch onto the skin as needed (pain). Remove & Discard patch within 12 hours or as directed by MD), Disp: 7 patch, Rfl: 0   loperamide (IMODIUM A-D) 2 MG tablet, Take 2 mg by mouth as needed for diarrhea or loose stools., Disp: , Rfl:    montelukast  (SINGULAIR ) 10 MG tablet, Take 1 tablet (10 mg total) by mouth at bedtime., Disp: 30 tablet, Rfl: 5   mupirocin  cream (BACTROBAN ) 2 %, Apply 1 Application topically 2 (two) times daily., Disp: 15 g, Rfl: 0   pantoprazole  (PROTONIX ) 40 MG tablet, Take 1 tablet (40 mg total) by mouth every morning. TAKE 1 TABLET BY MOUTH EVERY DAY, Disp: 90 tablet, Rfl: 1   potassium chloride  SA (KLOR-CON  M20) 20 MEQ tablet, Take 1 tablet (20 mEq total) by mouth 2 (two) times daily., Disp: 180 tablet, Rfl: 3   povidone-iodine  (BETADINE ) 10 % external solution, Paint  in webspace next to left great toe once daily, Disp: 118 mL, Rfl: 0   rosuvastatin  (CRESTOR ) 40 MG tablet, Take 1 tablet (40 mg total) by mouth daily., Disp: 90 tablet, Rfl: 3   Tiotropium Bromide Monohydrate (SPIRIVA RESPIMAT) 1.25  MCG/ACT AERS, Inhale 1.25 mcg into the lungs daily., Disp: , Rfl:    tiZANidine  (ZANAFLEX ) 2 MG tablet, Take 1 tablet (2 mg total) by mouth every 6 (six) hours as needed for muscle spasms., Disp: 40 tablet, Rfl: 1   Torsemide  40 MG TABS, Take 1 tablet (40 mg) by mouth daily every morning (Patient taking differently: Per patient taking 4 tablets daily.), Disp: 30 tablet, Rfl: 11   traZODone  (DESYREL ) 100 MG tablet, TAKE ONE TABLET BY MOUTH EVERYDAY AT BEDTIME, Disp: 90 tablet, Rfl: 2   triamcinolone  cream (KENALOG ) 0.1 %, Apply 1 Application topically as needed., Disp: , Rfl:  No current facility-administered medications for this visit.  Facility-Administered Medications Ordered in Other Visits:  Spy Agent Green / Firefly Optime, 1.25 mg, Intravenous, Once, Tanda Locus, MD  Social History: Social History   Tobacco Use   Smoking status: Former    Current packs/day: 0.00    Average packs/day: 0.3 packs/day for 12.0 years (3.6 ttl pk-yrs)    Types: Cigarettes    Start date: 01/02/2000    Quit date: 01/02/2012    Years since quitting: 12.7   Smokeless tobacco: Never   Tobacco comments:    1 pack per week  Vaping Use   Vaping status: Never Used  Substance Use Topics   Alcohol  use: No   Drug use: No    Family Medical History: Family History  Problem Relation Age of Onset   Heart disease Mother    Stroke Mother    Stroke Father    Clotting disorder Father    Asthma Sister    Colon cancer Neg Hx    Stomach cancer Neg Hx     Physical Examination: There were no vitals filed for this visit.  General: Patient is in no apparent distress. Attention to examination is appropriate.  Neck:   Supple.  Full range of motion.  Respiratory: Patient is breathing without any difficulty.   NEUROLOGICAL:     Awake, alert, oriented to person, place, and time.  Speech is clear and fluent.   Cranial Nerves: Pupils equal round and reactive to light.  Facial tone is symmetric.  Facial  sensation is symmetric. Shoulder shrug is symmetric. Tongue protrusion is midline.  There is no pronator drift.  Strength: Side Biceps Triceps Deltoid Interossei Grip Wrist Ext. Wrist Flex.  R 5 5 5 5 5 5 5   L 5 5 5 5 5 5 5    Side Iliopsoas Quads Hamstring PF DF EHL  R 5 5 5 5 5 5   L 5 5 5 5 5 5    Reflexes are ***2+ and symmetric at the biceps, triceps, brachioradialis, patella and achilles.   Hoffman's is absent.   Bilateral upper and lower extremity sensation is intact to light touch.    No evidence of dysmetria noted.  Gait is normal.     Medical Decision Making  Imaging: ***  I have personally reviewed the images and agree with the above interpretation.  Assessment and Plan: Ms. Kuehnle is a pleasant 83 y.o. female with ***      Thank you for involving me in the care of this patient.      Chester K. Clois MD, [facility]

## 2024-10-17 ENCOUNTER — Telehealth: Payer: Self-pay | Admitting: Neurology

## 2024-10-17 NOTE — Telephone Encounter (Signed)
 Pt returning call for lab results. Please advise.

## 2024-10-17 NOTE — Telephone Encounter (Signed)
 Since pt has had her MRI she is asking if appointment to see specialist(in [place]) still needed.  Pt is asking to be called at [phone], pt asking to be called today sine the appointment is scheduled for tomorrow.

## 2024-10-18 ENCOUNTER — Ambulatory Visit: Admitting: Neurosurgery

## 2024-10-18 NOTE — Telephone Encounter (Signed)
 Phone rep called [phone] and relayed response from [place], [place]

## 2024-10-21 ENCOUNTER — Ambulatory Visit (INDEPENDENT_AMBULATORY_CARE_PROVIDER_SITE_OTHER): Admitting: Otolaryngology

## 2024-10-21 ENCOUNTER — Encounter (INDEPENDENT_AMBULATORY_CARE_PROVIDER_SITE_OTHER): Payer: Self-pay

## 2024-10-21 ENCOUNTER — Ambulatory Visit (INDEPENDENT_AMBULATORY_CARE_PROVIDER_SITE_OTHER)

## 2024-10-21 VITALS — BP 99/62 | HR 54 | Wt 201.0 lb

## 2024-10-21 DIAGNOSIS — K219 Gastro-esophageal reflux disease without esophagitis: Secondary | ICD-10-CM | POA: Diagnosis not present

## 2024-10-21 DIAGNOSIS — J381 Polyp of vocal cord and larynx: Secondary | ICD-10-CM

## 2024-10-21 DIAGNOSIS — R1310 Dysphagia, unspecified: Secondary | ICD-10-CM

## 2024-10-21 DIAGNOSIS — R490 Dysphonia: Secondary | ICD-10-CM

## 2024-10-23 NOTE — Progress Notes (Signed)
 HPI:  Discussed the use of AI scribe software for clinical note transcription with the patient, who gave verbal consent to proceed.  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 She was previously seen by Dr. Soldatova for evaluation of her voice issues.  She experiences intermittent voice changes, described as 'goes and comes'. She has a history of vocal cord polyps, as previously discussed with her at her previous appointment. No reflux medication is currently being taken.  She completed a swallow study which did not show any significant findings. She continues to experience swallowing difficulties, particularly with pills and certain foods, which sometimes get stuck in her throat however this is not consistent. Liquids, such as water , do not cause any issues.    PMH/Meds/All/SocHx/FamHx/ROS: Past Medical History:  Diagnosis Date   Allergic rhinitis    Arnold-Chiari malformation (HCC)    Asthma    CAD (coronary artery disease)    COPD (chronic obstructive pulmonary disease) (HCC)    Coughing    coughing since last 01-22-2019 , started on cefdinir bid x10 days, has 3 pills left today , reports she feels mcuh better , still coughing copiuus amonts of thick white sputum , denies fever nor chills, nor body aches .    DJD (degenerative joint disease)    GERD (gastroesophageal reflux disease)    Hiatal hernia    History of absence seizures    last confirmed seizure age 43     Hx of colonic polyps    Hypercholesteremia    Hypertension    Obesity    Positive PPD    Reflex sympathetic dystrophy    Sleep apnea    Varicose veins    Venous insufficiency    Past Surgical History:  Procedure Laterality Date   ABDOMINAL HYSTERECTOMY     CHOLECYSTECTOMY N/A 02/19/2023   Procedure: LAPAROSCOPIC CHOLECYSTECTOMY WITH INTRAOPERATIVE CHOLANGIOGRAM AND ICG DYE;  Surgeon: Tanda Locus, MD;  Location: [facility] OR;  Service: General;  Laterality: N/A;   cspine surgery  12/02/1991   for arnold-chiari malformation   IR BONE MARROW BIOPSY & ASPIRATION  01/28/2023   JOINT REPLACEMENT  06/05/2011   Left total knee replacement   right knee arthroscopy  12/02/2007   Dr. Duwayne   right total knee replacement  05/31/2008   Dr. Duwayne   TONSILLECTOMY     TOTAL KNEE REVISION Left 02/03/2019   Procedure: LEFT TOTAL KNEE REVISION;  Surgeon: Fidel Rogue, MD;  Location: [facility] ORS;  Service: Orthopedics;  Laterality: Left;   No family history of bleeding disorders, wound healing problems or difficulty with anesthesia.  Social Connections: Not on file    Current Outpatient Medications:    acetaminophen  (TYLENOL ) 500 MG tablet, Take 1,000 mg by mouth every 6 (six) hours as needed for moderate pain., Disp: , Rfl:    albuterol  (VENTOLIN  HFA) 108 (90 Base) MCG/ACT inhaler, Inhale 2 puffs into the lungs every 4 (four) hours as needed. For wheeze or shortness of breath, Disp: 18 g, Rfl: 1   alendronate  (FOSAMAX ) 70 MG tablet, Take 70 mg by mouth once a week., Disp: , Rfl:    aspirin  81 MG tablet, Take 81 mg by mouth daily., Disp: , Rfl:    azelastine  (ASTELIN ) 0.1 % nasal spray, Place 1 spray into both nostrils 2 (two) times daily as needed for rhinitis or allergies., Disp: , Rfl:    budesonide -formoterol  (  SYMBICORT ) 80-4.5 MCG/ACT inhaler, Inhale 2 puffs into the lungs 2 (two) times daily as needed (shortness of breath)., Disp: , Rfl:    budesonide -glycopyrrolate -formoterol  (BREZTRI  AEROSPHERE) 160-9-4.8 MCG/ACT AERO inhaler, Inhale 2 puffs into the lungs in the morning and at bedtime., Disp: 32.1 g, Rfl: 1   CVS D3 50 MCG (2000 UT) CAPS, Take 1 capsule by mouth daily., Disp: , Rfl:    cyanocobalamin  (VITAMIN B12) 1000 MCG tablet, Take 1,000 mcg by mouth daily., Disp: , Rfl:    donepezil  (ARICEPT ) 5 MG tablet, Take 5 mg by mouth at bedtime., Disp: , Rfl:    EPINEPHrine  0.3 mg/0.3 mL IJ SOAJ  injection, Inject 0.3 mg into the muscle as needed for anaphylaxis., Disp: , Rfl:    famotidine  (PEPCID ) 40 MG tablet, Take 1 tablet (40 mg total) by mouth at bedtime., Disp: 90 tablet, Rfl: 1   ferrous sulfate  325 (65 FE) MG EC tablet, Take 325 mg by mouth daily., Disp: , Rfl:    fluticasone  (FLONASE ) 50 MCG/ACT nasal spray, Place 1 spray into both nostrils in the morning and at bedtime., Disp: 48 g, Rfl: 1   lidocaine  (LIDODERM ) 5 %, Place 1 patch onto the skin daily. Remove & Discard patch within 12 hours or as directed by MD (Patient taking differently: Place 1 patch onto the skin as needed (pain). Remove & Discard patch within 12 hours or as directed by MD), Disp: 7 patch, Rfl: 0   loperamide (IMODIUM A-D) 2 MG tablet, Take 2 mg by mouth as needed for diarrhea or loose stools., Disp: , Rfl:    montelukast  (SINGULAIR ) 10 MG tablet, Take 1 tablet (10 mg total) by mouth at bedtime., Disp: 30 tablet, Rfl: 5   mupirocin  cream (BACTROBAN ) 2 %, Apply 1 Application topically 2 (two) times daily., Disp: 15 g, Rfl: 0   pantoprazole  (PROTONIX ) 40 MG tablet, Take 1 tablet (40 mg total) by mouth every morning. TAKE 1 TABLET BY MOUTH EVERY DAY, Disp: 90 tablet, Rfl: 1   potassium chloride  SA (KLOR-CON  M20) 20 MEQ tablet, Take 1 tablet (20 mEq total) by mouth 2 (two) times daily., Disp: 180 tablet, Rfl: 3   povidone-iodine  (BETADINE ) 10 % external solution, Paint  in webspace next to left great toe once daily, Disp: 118 mL, Rfl: 0   rosuvastatin  (CRESTOR ) 40 MG tablet, Take 1 tablet (40 mg total) by mouth daily., Disp: 90 tablet, Rfl: 3   Tiotropium Bromide Monohydrate (SPIRIVA RESPIMAT) 1.25 MCG/ACT AERS, Inhale 1.25 mcg into the lungs daily., Disp: , Rfl:    tiZANidine  (ZANAFLEX ) 2 MG tablet, Take 1 tablet (2 mg total) by mouth every 6 (six) hours as needed for muscle spasms., Disp: 40 tablet, Rfl: 1   Torsemide  40 MG TABS, Take 1 tablet (40 mg) by mouth daily every morning (Patient taking differently: Per  patient taking 4 tablets daily.), Disp: 30 tablet, Rfl: 11   traZODone  (DESYREL ) 100 MG tablet, TAKE ONE TABLET BY MOUTH EVERYDAY AT BEDTIME, Disp: 90 tablet, Rfl: 2   triamcinolone  cream (KENALOG ) 0.1 %, Apply 1 Application topically as needed., Disp: , Rfl:  No current facility-administered medications for this visit.  Facility-Administered Medications Ordered in Other Visits:    Spy Agent Green / Firefly Optime, 1.25 mg, Intravenous, Once, Tanda Locus, MD A complete ROS was performed with pertinent positives/negatives noted in the HPI. The remainder of the ROS are negative.   Physical Exam:  BP 99/62 (BP Location: Left Arm, Patient Position: Sitting, Cuff Size: Normal)  Pulse (!) 54   Wt 201 lb (91.2 kg)   SpO2 95%   BMI 42.01 kg/m  General: Well developed, well nourished. No acute distress. Voice hoarse Head/Face: Normocephalic. No sinus tenderness. Facial nerve intact and equal bilaterally. No facial lacerations. Eyes: PERRL, no scleral icterus or conjunctival hemorrhage. EOMI. Ears: No gross deformity. Normal external canal. Hearing: Normal speech reception.  Nose: No gross deformity or lesions. No purulent discharge. No turbinate hypertrophy. Mouth/Oropharynx: Lips without any lesions. No mucosal lesions within the oropharynx. No tonsillar enlargement, exudate, or lesions. Pharyngeal walls symmetrical. Uvula midline. Tongue midline without lesions. Larynx: See TFL if applicable Nasopharynx: See TFL if applicable Neck: Trachea midline. No masses. No thyromegaly or nodules palpated. No crepitus. Lymphatic: No lymphadenopathy in the neck. Respiratory: No stridor or distress. Room air. Cardiovascular: Regular rate and rhythm. Extremities: No edema or cyanosis. Warm and well-perfused. Skin: No scars or lesions on face or neck. Neurologic: CN II-XII grossly intact. Moving all extremities without gross abnormality. Other:  Independent Review of Additional Tests or  Records: None Procedures: Flexible Fiberoptic Laryngoscopy Procedure Note Pre-Op Diagnosis: hoarseness    Post Op Diagnosis: same Procedure: Flexible Fiberoptic Laryngoscopy CPT 31575 - Mod 25 Surgeon: Adah Malkin, D.O. Anesthesia: 4% Lidocaine  with Afrin Findings:  - Right true vocal fold polyp at anterior 1/3 of the vocal cord - bilateral vocal cord motion Procedure Detail: After verbal consent was obtained from the patient, the patient was brought in an upright position, a fiberoptic nasal laryngoscope was then passed into the patient right nasal passage and left nasal passage, the left appeared to be more patent. It was passed along the floor of the nasal cavity to the nasopharynx. Torus tubarius was patent and the Fossa of Rosenmller was identified. The scope was then flexed caudally and advanced slowly through the nasopharynx, passed through the oropharynx, and down into the hypopharynx. The patient's oro- and nasopharynx were unremarkable with no signs of any gross lesions, edema, masses, or bleeding.  The base of tongue was visualized and no mass, ulceration or lesion was appreciated. The epiglottis did not demonstrate any mass, ulceration or lesion. The vallecula was also assessed with no mass, ulceration or lesion. The patient had good glottic closure upon phonation and no signs of aspiration or pooling of secretions. The patient was asked to inspire and expire with the true vocal folds vibrating normally and without evidence of vocal fold dysfunction. The right true vocal fold was noted to have a polypoid growth at the anterior 1/3 approaching the commissure. The true and false vocal cords, interarytenoid, AE folds, and arytenoids did not demonstrate any significant edema or erythema. The patient was then asked to valsalva, and the pyriform sinuses were assessed which were unremarkable. The airway was patent and there was no evidence of compromise. The scope was then slowly withdrawn from  the patient. The patient tolerated the procedure well and there were no complications.  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 Previous examination revealed polyp on vocal cord. - Referred to speech therapy. - Consider surgical removal if polyp persists post-therapy. - Ensure reflux medication adherence.  Dysphagia Intermittent dysphagia with difficulty swallowing pills and certain foods, liquids tolerated. - Referred to GI specialist for further evaluation.  Follow-up in 6 months for repeat scope and discussion regarding surgical intervention.  Adah Malkin, DO [facility]

## 2024-10-24 NOTE — Progress Notes (Signed)
 Referring Physician:  Gregg Lek, MD [address] [place],  [place] [address]  Primary Physician:  [name], MD  History of Present Illness: 11/01/2024 [name] is here today with a chief complaint of arthritis who presents with neck and back pain. She was referred by Dr. Gregg for evaluation of her neck issue.  She has had neck pain for the past one to two years, worse with head turning and at night, causing difficulty sleeping. She has completed neck-focused therapy and receives cervical injections every three months, which relieve pain for only two to three days.  She has numbness and tingling in her hands, poor balance, difficulty with fine motor tasks, and occasionally drops objects. She has used a walker for about a year. Leg weakness has been present for a long time. She also has hand pain from arthritis.  Her back pain has been present for about a year and limits daily activities such as cooking and cleaning. She receives back injections with short-term benefit. She takes gabapentin  and Torsemide  for pain management.  She quit smoking about two years ago. She lives with her grandson, who helps with daily activities due to her limited mobility and difficulty with household tasks.    Discussed the use of AI scribe software for clinical note transcription with the patient, who gave verbal consent to proceed.  Bowel/Bladder Dysfunction: none  Conservative measures:  Physical therapy: referral was placed, she is scheduled for initial evaluation on 10/31/24 through [facility].  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  The symptoms are causing a significant impact on the patient's life.   I have utilized the care everywhere function in epic to review the outside records available from external health  systems.   Review of Systems:  A 10 point review of systems is negative, except for the pertinent positives and negatives detailed in the HPI.  Past Medical History: Past Medical History:  Diagnosis Date   Allergic rhinitis    Arnold-Chiari malformation (HCC)    Asthma    CAD (coronary artery disease)    COPD (chronic obstructive pulmonary disease) (HCC)    Coughing    coughing since last 01-22-2019 , started on cefdinir bid x10 days, has 3 pills left today , reports she feels mcuh better , still coughing copiuus amonts of thick white sputum , denies fever nor chills, nor body aches .    DJD (degenerative joint disease)    GERD (gastroesophageal reflux disease)    Hiatal hernia    History of absence seizures    last confirmed seizure age 61     Hx of colonic polyps    Hypercholesteremia    Hypertension    Obesity    Positive PPD    Reflex sympathetic dystrophy    Sleep apnea    Varicose veins    Venous insufficiency     Past Surgical History: Past Surgical History:  Procedure Laterality Date   ABDOMINAL HYSTERECTOMY     CHOLECYSTECTOMY N/A 02/19/2023   Procedure: LAPAROSCOPIC CHOLECYSTECTOMY WITH INTRAOPERATIVE CHOLANGIOGRAM AND ICG DYE;  Surgeon: Tanda Locus, MD;  Location: [facility] OR;  Service: General;  Laterality: N/A;   cspine surgery  12/02/1991   for arnold-chiari malformation   IR BONE MARROW BIOPSY & ASPIRATION  01/28/2023   JOINT REPLACEMENT  06/05/2011   Left total knee replacement   right  knee arthroscopy  12/02/2007   Dr. Duwayne   right total knee replacement  05/31/2008   Dr. Duwayne   TONSILLECTOMY     TOTAL KNEE REVISION Left 02/03/2019   Procedure: LEFT TOTAL KNEE REVISION;  Surgeon: Fidel Rogue, MD;  Location: [facility] OR[facility];  Service: Orthopedics;  Laterality: Left;    Allergies: Allergies as of 11/01/2024   (No Known Allergies)    Medications:  Current Outpatient Medications:    acetaminophen  (TYLENOL ) 500 MG tablet, Take 1,000 mg by mouth every 6  (six) hours as needed for moderate pain., Disp: , Rfl:    albuterol  (VENTOLIN  HFA) 108 (90 Base) MCG/ACT inhaler, Inhale 2 puffs into the lungs every 4 (four) hours as needed. For wheeze or shortness of breath, Disp: 18 g, Rfl: 1   alendronate  (FOSAMAX ) 70 MG tablet, Take 70 mg by mouth once a week., Disp: , Rfl:    aspirin  81 MG tablet, Take 81 mg by mouth daily., Disp: , Rfl:    azelastine  (ASTELIN ) 0.1 % nasal spray, Place 1 spray into both nostrils 2 (two) times daily as needed for rhinitis or allergies., Disp: , Rfl:    budesonide -formoterol  (SYMBICORT ) 80-4.5 MCG/ACT inhaler, Inhale 2 puffs into the lungs 2 (two) times daily as needed (shortness of breath)., Disp: , Rfl:    budesonide -glycopyrrolate -formoterol  (BREZTRI  AEROSPHERE) 160-9-4.8 MCG/ACT AERO inhaler, Inhale 2 puffs into the lungs in the morning and at bedtime., Disp: 32.1 g, Rfl: 1   CVS D3 50 MCG (2000 UT) CAPS, Take 1 capsule by mouth daily., Disp: , Rfl:    cyanocobalamin  (VITAMIN B12) 1000 MCG tablet, Take 1,000 mcg by mouth daily., Disp: , Rfl:    donepezil  (ARICEPT ) 5 MG tablet, Take 5 mg by mouth at bedtime., Disp: , Rfl:    EPINEPHrine  0.3 mg/0.3 mL IJ SOAJ injection, Inject 0.3 mg into the muscle as needed for anaphylaxis., Disp: , Rfl:    famotidine  (PEPCID ) 40 MG tablet, Take 1 tablet (40 mg total) by mouth at bedtime., Disp: 90 tablet, Rfl: 1   ferrous sulfate  325 (65 FE) MG EC tablet, Take 325 mg by mouth daily., Disp: , Rfl:    fluticasone  (FLONASE ) 50 MCG/ACT nasal spray, Place 1 spray into both nostrils in the morning and at bedtime., Disp: 48 g, Rfl: 1   lidocaine  (LIDODERM ) 5 %, Place 1 patch onto the skin daily. Remove & Discard patch within 12 hours or as directed by MD (Patient taking differently: Place 1 patch onto the skin as needed (pain). Remove & Discard patch within 12 hours or as directed by MD), Disp: 7 patch, Rfl: 0   loperamide (IMODIUM A-D) 2 MG tablet, Take 2 mg by mouth as needed for diarrhea or  loose stools., Disp: , Rfl:    montelukast  (SINGULAIR ) 10 MG tablet, Take 1 tablet (10 mg total) by mouth at bedtime., Disp: 30 tablet, Rfl: 5   mupirocin  cream (BACTROBAN ) 2 %, Apply 1 Application topically 2 (two) times daily., Disp: 15 g, Rfl: 0   pantoprazole  (PROTONIX ) 40 MG tablet, Take 1 tablet (40 mg total) by mouth every morning. TAKE 1 TABLET BY MOUTH EVERY DAY, Disp: 90 tablet, Rfl: 1   potassium chloride  SA (KLOR-CON  M20) 20 MEQ tablet, Take 1 tablet (20 mEq total) by mouth 2 (two) times daily., Disp: 180 tablet, Rfl: 3   povidone-iodine  (BETADINE ) 10 % external solution, Paint  in webspace next to left great toe once daily, Disp: 118 mL, Rfl: 0   rosuvastatin  (CRESTOR )  40 MG tablet, Take 1 tablet (40 mg total) by mouth daily., Disp: 90 tablet, Rfl: 3   Tiotropium Bromide Monohydrate (SPIRIVA RESPIMAT) 1.25 MCG/ACT AERS, Inhale 1.25 mcg into the lungs daily., Disp: , Rfl:    tiZANidine  (ZANAFLEX ) 2 MG tablet, Take 1 tablet (2 mg total) by mouth every 6 (six) hours as needed for muscle spasms., Disp: 40 tablet, Rfl: 1   Torsemide  40 MG TABS, Take 1 tablet (40 mg) by mouth daily every morning (Patient taking differently: Per patient taking 4 tablets daily.), Disp: 30 tablet, Rfl: 11   traZODone  (DESYREL ) 100 MG tablet, TAKE ONE TABLET BY MOUTH EVERYDAY AT BEDTIME, Disp: 90 tablet, Rfl: 2   triamcinolone  cream (KENALOG ) 0.1 %, Apply 1 Application topically as needed., Disp: , Rfl:  No current facility-administered medications for this visit.  Facility-Administered Medications Ordered in Other Visits:    Spy Agent Green / Firefly Optime, 1.25 mg, Intravenous, Once, Tanda Locus, MD  Social History: Social History   Tobacco Use   Smoking status: Former    Current packs/day: 0.00    Average packs/day: 0.3 packs/day for 12.0 years (3.6 ttl pk-yrs)    Types: Cigarettes    Start date: 01/02/2000    Quit date: 01/02/2012    Years since quitting: 12.8   Smokeless tobacco: Never   Tobacco  comments:    1 pack per week  Vaping Use   Vaping status: Never Used  Substance Use Topics   Alcohol  use: No   Drug use: No    Family Medical History: Family History  Problem Relation Age of Onset   Heart disease Mother    Stroke Mother    Stroke Father    Clotting disorder Father    Asthma Sister    Colon cancer Neg Hx    Stomach cancer Neg Hx     Physical Examination: Vitals:   11/01/24 1315  BP: 124/78    General: Patient is in no apparent distress. Attention to examination is appropriate.  Neck:   Supple.  Full range of motion.  Respiratory: Patient is breathing without any difficulty.   NEUROLOGICAL:     Awake, alert, oriented to person, place, and time.  Speech is clear and fluent.   Cranial Nerves: Pupils equal round and reactive to light.  Facial tone is symmetric.  Facial sensation is symmetric. Shoulder shrug is symmetric. Tongue protrusion is midline.  There is no pronator drift.  Strength: Side Biceps Triceps Deltoid Interossei Grip Wrist Ext. Wrist Flex.  R 5 5 5 5 5 5 5   L 5 5 5 5 5 5 5    Side Iliopsoas Quads Hamstring PF DF EHL  R 5 5 5 5 5 5   L 5 5 5 5 5 5    Reflexes are 2+ and symmetric at the biceps, triceps, brachioradialis, 1+patella and achilles.   Hoffman's is absent.   Bilateral upper and lower extremity sensation is intact to light touch.    No evidence of dysmetria noted.  Gait is abnormal and requires a walker.     Medical Decision Making  Imaging: MRI C spine 09/26/2024 IMPRESSION:    MRI cervical spine (without) demonstrating: - Multilevel spondylosis, disc bulging, reversal of normal cervical curvature and grade 2 anterior spondylolisthesis of C2 and C3 measuring 7 mm. - At C6-7 disc bulging and facet hypertrophy with moderate to severe spinal stenosis and severe bilateral foraminal stenosis; no cord signal abnormalities. - At C2-3 pseudo disc bulging and facet hypertrophy with moderate  spinal stenosis and severe bilateral  foraminal stenosis; no cord signal abnormalities. - At C3-4 disc bulging with mild spinal stenosis and severe bilateral foraminal stenosis; no cord signal abnormalities.       INTERPRETING PHYSICIAN:  EDUARD FABIENE HANLON, MD Certified in Neurology, Neurophysiology and Neuroimaging   [facility] [address] [place], [place] [address] [phone]  I have personally reviewed the images and agree with the above interpretation.  Assessment and Plan: Ms. Hodkinson is a pleasant 83 y.o. 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  She has severe back pain from this.  At this point, I would recommend she continue physical therapy.  She is not terribly interested in considering surgery at this point.  I will see her back in 3 months to reevaluate for possible posterior cervical intervention.  Given her current functional status, I do not feel that a major lumbar spine intervention at this point be appropriate.  She will continue physical therapy for now.  She has done injections in the past.  It is fine from my standpoint for her to continue these for now.  I spent a total of 30 minutes in this patient's care today. This time was spent reviewing pertinent records including imaging studies, obtaining and confirming history, performing a directed evaluation, formulating and discussing my recommendations, and documenting the visit within the medical record.      Thank you for involving me in the care of this patient.      Darcella Shiffman K. Clois MD, [facility]

## 2024-10-26 ENCOUNTER — Ambulatory Visit: Admitting: Podiatry

## 2024-10-28 NOTE — Therapy (Signed)
 OUTPATIENT PHYSICAL THERAPY NEURO EVALUATION   Patient Name: [name] MRN: [id number] DOB:06-Dec-1940, 83 y.o., female Today's Date: 10/31/2024   PCP: [name], MD REFERRING PROVIDER: Gregg Lek, MD  END OF SESSION:  PT End of Session - 10/31/24 1237     Visit Number 1    Number of Visits 17   16 plus Eval   Date for Recertification  01/09/25    Authorization Type [employer]    PT Start Time 1237   pt arrived on time but needed to use restroom   PT Stop Time 1314    PT Time Calculation (min) 37 min    Equipment Utilized During Treatment Gait belt    Activity Tolerance Patient tolerated treatment well    Behavior During Therapy WFL for tasks assessed/performed          Past Medical History:  Diagnosis Date   Allergic rhinitis    Arnold-Chiari malformation (HCC)    Asthma    CAD (coronary artery disease)    COPD (chronic obstructive pulmonary disease) (HCC)    Coughing    coughing since last 01-22-2019 , started on cefdinir bid x10 days, has 3 pills left today , reports she feels mcuh better , still coughing copiuus amonts of thick white sputum , denies fever nor chills, nor body aches .    DJD (degenerative joint disease)    GERD (gastroesophageal reflux disease)    Hiatal hernia    History of absence seizures    last confirmed seizure age 31     Hx of colonic polyps    Hypercholesteremia    Hypertension    Obesity    Positive PPD    Reflex sympathetic dystrophy    Sleep apnea    Varicose veins    Venous insufficiency    Past Surgical History:  Procedure Laterality Date   ABDOMINAL HYSTERECTOMY     CHOLECYSTECTOMY N/A 02/19/2023   Procedure: LAPAROSCOPIC CHOLECYSTECTOMY WITH INTRAOPERATIVE CHOLANGIOGRAM AND ICG DYE;  Surgeon: Tanda Locus, MD;  Location: [facility] OR;  Service: General;  Laterality: N/A;   cspine surgery  12/02/1991   for arnold-chiari malformation   IR BONE MARROW BIOPSY & ASPIRATION  01/28/2023   JOINT REPLACEMENT  06/05/2011    Left total knee replacement   right knee arthroscopy  12/02/2007   Dr. Duwayne   right total knee replacement  05/31/2008   Dr. Duwayne   TONSILLECTOMY     TOTAL KNEE REVISION Left 02/03/2019   Procedure: LEFT TOTAL KNEE REVISION;  Surgeon: Fidel Rogue, MD;  Location: [facility] ORS;  Service: Orthopedics;  Laterality: Left;   Patient Active Problem List   Diagnosis Date Noted   Seasonal and perennial allergic rhinitis 07/18/2024   History of smoking 25-50 pack years 07/18/2024   Painful swallowing 07/18/2024   Abnormal CT scan 12/19/2022   Pain of upper abdomen 12/19/2022   Dilation of biliary tract 12/16/2022   Cellulitis of right leg 06/18/2022   Hyperglycemia 06/18/2022   Laceration of right index finger 05/08/2022   Acquired hallux valgus of left foot 11/22/2021   Primary localized osteoarthrosis of ankle and foot 11/22/2021   AKI (acute kidney injury) 03/13/2021   Memory loss 03/13/2021   Anxiety 03/13/2021   Hypokalemia 03/05/2021   Generalized weakness 03/04/2021   Dizziness 03/04/2021   Vitamin D  deficiency 01/16/2021   Cough 12/13/2020   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   Allergic rhinitis due to animal (cat) (dog) hair and dander 12/04/2020   Allergic rhinitis due  to pollen 12/04/2020   Moderate persistent asthma, uncomplicated 12/04/2020   Radiculopathy, lumbar region 11/19/2020   Spondylolisthesis, lumbar region 11/19/2020   Leg cramping 11/06/2020   Degeneration of lumbar intervertebral disc 09/27/2020   Degenerative spondylolisthesis 08/15/2020   Pes anserinus bursitis of right knee 05/07/2020   Ulcer of toe, left, limited to breakdown of skin (HCC) 08/31/2019   Diastolic CHF, chronic (HCC) 08/31/2019   PVD (peripheral vascular disease) 08/31/2019   Bilateral carpal tunnel syndrome 08/19/2019   Posterior neck pain 08/19/2019   Vertigo 04/08/2019   Anemia 04/08/2019   Stiffness of left knee 02/10/2019   Failed total knee, left 02/03/2019   Failed total knee, left, initial  encounter 02/03/2019   Carpal tunnel syndrome of left wrist 01/21/2019   Pain of left hand 01/21/2019   Preop exam for internal medicine 11/02/2018   Left hand paresthesia 11/02/2018   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 11/01/2018   Hyperopia with presbyopia, bilateral 06/29/2018   Abdominal pain, lower 03/02/2018   Diarrhea 03/02/2018   Encounter for well adult exam with abnormal findings 11/14/2017   Arnold-Chiari malformation (HCC)    Asthma, well controlled    CAD (coronary artery disease)    COPD (chronic obstructive pulmonary disease) (HCC)    DJD (degenerative joint disease)    History of absence seizures    Hx of colonic polyps    Hypercholesteremia    Positive PPD    Venous insufficiency    Pain in left lower leg 07/20/2017   Cellulitis of left lower extremity 06/18/2017   Bilateral lower extremity edema 04/30/2017   Shingles 02/26/2017   COPD exacerbation (HCC) 01/07/2017   S/P TKR (total knee replacement), bilateral 01/07/2017   Right arm pain 05/16/2015   Generalized anxiety disorder 08/31/2014   Varicose veins of bilateral lower extremities with other complications 01/06/2012   Edema 08/07/2011   MENORRHAGIA, POSTMENOPAUSAL 08/13/2010   CIGARETTE SMOKER 06/11/2010   CARDIAC MURMUR 11/02/2009   Bradycardia 11/01/2009   CHEST PAIN 11/01/2009   Allergic rhinitis 04/19/2009   Chronic obstructive airway disease with asthma (HCC) 04/18/2009   LEG CRAMPS, NOCTURNAL 11/02/2008   Essential hypertension 01/17/2008   COLONIC POLYPS 01/14/2008   Body mass index (BMI) 36.0-36.9, adult 01/14/2008   Reflex sympathetic dystrophy 01/14/2008   Coronary atherosclerosis 01/14/2008   LPRD (laryngopharyngeal reflux disease) 01/14/2008   Osteoarthritis 01/14/2008   BACK PAIN, LUMBAR 01/14/2008   SEIZURES, HX OF 01/14/2008    ONSET DATE: 09/30/2024 (date of referral)  REFERRING DIAG: R53.1 (ICD-10-CM) - Weakness M54.2 (ICD-10-CM) - Cervicalgia  THERAPY DIAG:   Other abnormalities of gait and mobility  Muscle weakness (generalized)  Cervicalgia  Rationale for Evaluation and Treatment: Rehabilitation  SUBJECTIVE:  SUBJECTIVE STATEMENT: Pt reports having tremors in her hands and also rest of her body.  Back and legs are giving her trouble so using RW.  Walking 15-30 minutes a day [facility]  Having trouble with stomach for last year (on/off diarrhea). Pt accompanied by: self  PERTINENT HISTORY: Pt with tremors and weakness (noted back in June; went to ED; MRI brain did not show acute abnormalities).  Difficulty eating and holding objects; difficulty writing; tremors worsen during the day.  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  Per PT referral: Cervicalgia. Left side weakness. 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  PAIN:  Are you having pain? Yes: NPRS scale: 8/10 Pain location: back, stomach, and legs Pain description: sharp shooting; aching Aggravating factors: standing a lot Relieving factors: pain patches; medication cream  PRECAUTIONS: Fall; Swallowing difficulties (with pills and certain foods; not liquids/water )  RED FLAGS: None   WEIGHT BEARING RESTRICTIONS: No  FALLS: Has patient fallen in last 6 months? No  LIVING ENVIRONMENT: Lives with: lives with their family (grandson) Lives in: House/apartment Stairs: Yes: External: 5 steps; on right going up, on left going up, and can reach both Has following equipment at home: Single point cane and Walker - 2  wheeled  PLOF: Ambulates with RW (last 6 months or more).  Participated in PT first part of year--OP (finished recently to 09/07/24 MD visit).  Doing water  aerobic at [facility] (hasn't done in last 5 months d/t stomach acting up).  PATIENT GOALS: Try to get strength back and do some of the things she was able to do before.  OBJECTIVE:  Note: Objective measures were completed at Evaluation unless otherwise noted.  DIAGNOSTIC FINDINGS:  MRI C-spine 09/26/24: - Multilevel spondylosis, disc bulging, reversal of normal cervical curvature and grade 2 anterior spondylolisthesis of C2 and C3 measuring 7 mm. - At C6-7 disc bulging and facet hypertrophy with moderate to severe spinal stenosis and severe bilateral foraminal stenosis; no cord signal abnormalities. - At C2-3 pseudo disc bulging and facet hypertrophy with moderate spinal stenosis and severe bilateral foraminal stenosis; no cord signal abnormalities. - At C3-4 disc bulging with mild spinal stenosis and severe bilateral foraminal stenosis; no cord signal abnormalities.  COGNITION: Overall cognitive status: Within functional limits for tasks assessed   SENSATION: Decreased light touch sensation L lateral thigh, medial lower leg, and foot  COORDINATION: Decreased alternating toe taps L LE compared to R LE  EDEMA: (chronic L>R per pt report) Circumferential: malleolar line 27.5 cm R ankle; 28 cm L ankle  POSTURE: rounded shoulders and forward head  LOWER EXTREMITY MMT:    MMT Right Eval Left Eval  Hip flexion 4+/5 4+/5  Hip extension    Hip abduction    Hip adduction    Hip internal rotation    Hip external rotation    Knee flexion    Knee extension 4+/5 4+/5  Ankle dorsiflexion 4+/5 2/5  Ankle plantarflexion At least 3/5 AROM At least 2/5 AROM  Ankle inversion    Ankle eversion    (Blank rows = not tested)  TRANSFERS: Sit to stand: Modified independence  Assistive device utilized: Environmental Consultant - 2 wheeled     Stand to sit:  Modified independence  Assistive device utilized: Environmental Consultant - 2 wheeled      GAIT: Findings: Gait Characteristics: narrow BOS; R LE ER>L LE; foot flat R LE; decreased L LE DF with forefoot initial WB'ing during initial contact L LE; increased L trunk lean; short steps, Distance walked: clinic distances, Assistive device utilized:Walker - 2 wheeled, Level of assistance: Modified independence, and Comments: decreased gait speed  FUNCTIONAL TESTS:  5 times sit to stand: 49.78 seconds with B UE support (up to RW) 10 meter walk test: 0.31 m/sec (31.63 seconds with RW use)  PATIENT SURVEYS:  TBA  VITALS: Vitals:   10/31/24 1248  BP: 135/83  Pulse: (!) 50  HR 71 bpm post                                                                                                                             TREATMENT DATE: 10/31/24    PATIENT EDUCATION: Education details: Eval results; POC. Person educated: Patient Education method: Explanation and Verbal cues Education comprehension: verbalized understanding and needs further education  HOME EXERCISE PROGRAM: TBA  GOALS: Goals reviewed with patient? Yes  SHORT TERM GOALS: Target date: 11/28/2024  Pt will be independent with initial HEP in order to improve strength and balance in order to decrease fall risk and improve function at home for ADL's. Baseline: TBA Goal status: INITIAL  2.  Assess BERG Balance Test and update LTG as needed. Baseline: TBA Goal status: INITIAL  3.  Pt will decrease 5 Time Sit to Stand by at least 10 seconds in order to demonstrate clinically significant improvement in LE strength. Baseline: 49.78 seconds Goal status: INITIAL  4.  *** Baseline:  Goal status: INITIAL  5.  *** Baseline:  Goal status: INITIAL  6.  *** Baseline:  Goal status: INITIAL  LONG TERM GOALS: Target date: 12/26/2024  Pt will be independent with HEP in order to improve strength and balance in order to decrease fall risk and  improve function at home for ADL's. Baseline: TBA Goal status: INITIAL  2.  Patient will demonstrate an improved Berg Balance Score of >45 as to demonstrate improved balance with ADLs such as sitting/standing and transfer balance and reduced fall risk. Baseline: TBA  Goal status: INITIAL  3.  Pt will decrease 5 Time Sit to Stand by at least 20 seconds in order to demonstrate clinically significant improvement in LE strength. Baseline: 49.78 seconds Goal status: INITIAL  4.  Pt will increase by at least 0.13 m/s in order to demonstrate clinically significant improvement in community ambulation.  Baseline: 0.31 m/sec Goal status: INITIAL  5.  Pt will demonstrate improved L LE foot clearance for safety with gait. Baseline: Decreased L foot clearance with RW use Goal status: INITIAL  ASSESSMENT:  CLINICAL IMPRESSION: Patient is a 83 y.o. female who was seen today for physical therapy evaluation and treatment for L sided weakness and cervicalgia.  Patient presents with L LE weakness, decreased L  LE light touch and coordination, L>R LE edema, impaired posture, generalized weakness, decreased activity tolerance, increased effort to stand with B UE use, and gait impairments. These impairments are limiting patient from daily and community activities.  Evaluation included the following assessment tools: 5 time sit to stand and .  Pt scored 49.78 seconds on the 5 time sit to stand test indicating pt is increased risk of falls (>15 seconds = increased risk of falls).  Pt scored  0.31 m/sec on the 10 Meter Walk Test indicating pt is a household ambulation and increased fall risk (<0.4 m/s = household Ambulator and <1.0 = increased fall risk).  Patient will benefit from skilled PT to address noted impairments, improve overall function, and progress towards long term goals.  OBJECTIVE IMPAIRMENTS: Abnormal gait, cardiopulmonary status limiting activity, decreased activity tolerance, decreased  balance, decreased coordination, decreased endurance, decreased knowledge of condition, decreased knowledge of use of DME, decreased mobility, difficulty walking, decreased ROM, decreased strength, increased edema, impaired flexibility, impaired sensation, improper body mechanics, postural dysfunction, and pain.   ACTIVITY LIMITATIONS: carrying, lifting, bending, standing, squatting, stairs, transfers, bed mobility, continence, toileting, dressing, locomotion level, and caring for others  PARTICIPATION LIMITATIONS: meal prep, cleaning, laundry, shopping, community activity, and yard work  PERSONAL FACTORS: Age, Fitness, Past/current experiences, and 1-2 comorbidities: COPD, HFpEF, bradycardia, and memory loss are also affecting patient's functional outcome.   REHAB POTENTIAL: Good  CLINICAL DECISION MAKING: Evolving/moderate complexity  EVALUATION COMPLEXITY: Moderate  PLAN:  PT FREQUENCY: 2x/week  PT DURATION: 8 weeks  PLANNED INTERVENTIONS: [id number]- PT Re-evaluation, 97750- Physical Performance Testing, 97110-Therapeutic exercises, 97530- Therapeutic activity, [id number]- Neuromuscular re-education, 97535- Self Care, [id number]- Manual therapy, [id number]- Gait training, [phone]- Orthotic Initial, [phone]- Orthotic/Prosthetic subsequent, [phone]- Aquatic Therapy, [phone]- Electrical stimulation (manual), [id number]- Ultrasound, [id number]- Traction (mechanical), [phone] (1-2 muscles), [id number] (3+ muscles)- Dry Needling, Patient/Family education, Balance training, Stair training, Taping, Joint mobilization, Spinal mobilization, Compression bandaging, Cognitive remediation, DME instructions, Cryotherapy, Moist heat, and Biofeedback  PLAN FOR NEXT SESSION: Assess BERG balance test; Issue HEP; balance, strengthening, aerobic endurance; coordination.   Damien Caulk, PT 10/31/2024, 9:40 PM

## 2024-10-31 ENCOUNTER — Ambulatory Visit: Attending: Neurology

## 2024-10-31 ENCOUNTER — Other Ambulatory Visit: Payer: Self-pay

## 2024-10-31 ENCOUNTER — Encounter: Payer: Self-pay | Admitting: Physical Therapy

## 2024-10-31 ENCOUNTER — Ambulatory Visit: Admitting: Physical Therapy

## 2024-10-31 VITALS — BP 135/83 | HR 50

## 2024-10-31 DIAGNOSIS — M6281 Muscle weakness (generalized): Secondary | ICD-10-CM | POA: Diagnosis present

## 2024-10-31 DIAGNOSIS — R1310 Dysphagia, unspecified: Secondary | ICD-10-CM | POA: Insufficient documentation

## 2024-10-31 DIAGNOSIS — M542 Cervicalgia: Secondary | ICD-10-CM

## 2024-10-31 DIAGNOSIS — J381 Polyp of vocal cord and larynx: Secondary | ICD-10-CM | POA: Insufficient documentation

## 2024-10-31 DIAGNOSIS — R2689 Other abnormalities of gait and mobility: Secondary | ICD-10-CM | POA: Insufficient documentation

## 2024-10-31 DIAGNOSIS — R490 Dysphonia: Secondary | ICD-10-CM | POA: Diagnosis present

## 2024-10-31 NOTE — Therapy (Signed)
 OUTPATIENT SPEECH LANGUAGE PATHOLOGY VOICE EVALUATION   Patient Name: [name] MRN: [id number] DOB:Apr 27, 1941, 83 y.o., female Today's Date: 10/31/2024  PCP: April Millman, MD REFERRING PROVIDER: Mila Adah JONELLE, DO  END OF SESSION:  End of Session - 10/31/24 1602     Visit Number 1    Number of Visits 11    Date for Recertification  01/09/25    SLP Start Time 1317    SLP Stop Time  1402    SLP Time Calculation (min) 45 min    Activity Tolerance Patient tolerated treatment well          Past Medical History:  Diagnosis Date   Allergic rhinitis    Arnold-Chiari malformation (HCC)    Asthma    CAD (coronary artery disease)    COPD (chronic obstructive pulmonary disease) (HCC)    Coughing    coughing since last 01-22-2019 , started on cefdinir bid x10 days, has 3 pills left today , reports she feels mcuh better , still coughing copiuus amonts of thick white sputum , denies fever nor chills, nor body aches .    DJD (degenerative joint disease)    GERD (gastroesophageal reflux disease)    Hiatal hernia    History of absence seizures    last confirmed seizure age 80     Hx of colonic polyps    Hypercholesteremia    Hypertension    Obesity    Positive PPD    Reflex sympathetic dystrophy    Sleep apnea    Varicose veins    Venous insufficiency    Past Surgical History:  Procedure Laterality Date   ABDOMINAL HYSTERECTOMY     CHOLECYSTECTOMY N/A 02/19/2023   Procedure: LAPAROSCOPIC CHOLECYSTECTOMY WITH INTRAOPERATIVE CHOLANGIOGRAM AND ICG DYE;  Surgeon: Tanda Locus, MD;  Location: [facility] OR;  Service: General;  Laterality: N/A;   cspine surgery  12/02/1991   for arnold-chiari malformation   IR BONE MARROW BIOPSY & ASPIRATION  01/28/2023   JOINT REPLACEMENT  06/05/2011   Left total knee replacement   right knee arthroscopy  12/02/2007   Dr. Duwayne   right total knee replacement  05/31/2008   Dr. Duwayne   TONSILLECTOMY     TOTAL KNEE REVISION Left 02/03/2019    Procedure: LEFT TOTAL KNEE REVISION;  Surgeon: Fidel Rogue, MD;  Location: [facility] ORS;  Service: Orthopedics;  Laterality: Left;   Patient Active Problem List   Diagnosis Date Noted   Seasonal and perennial allergic rhinitis 07/18/2024   History of smoking 25-50 pack years 07/18/2024   Painful swallowing 07/18/2024   Abnormal CT scan 12/19/2022   Pain of upper abdomen 12/19/2022   Dilation of biliary tract 12/16/2022   Cellulitis of right leg 06/18/2022   Hyperglycemia 06/18/2022   Laceration of right index finger 05/08/2022   Acquired hallux valgus of left foot 11/22/2021   Primary localized osteoarthrosis of ankle and foot 11/22/2021   AKI (acute kidney injury) 03/13/2021   Memory loss 03/13/2021   Anxiety 03/13/2021   Hypokalemia 03/05/2021   Generalized weakness 03/04/2021   Dizziness 03/04/2021   Vitamin D  deficiency 01/16/2021   Cough 12/13/2020   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   Allergic rhinitis due to animal (cat) (dog) hair and dander 12/04/2020   Allergic rhinitis due to pollen 12/04/2020   Moderate persistent asthma, uncomplicated 12/04/2020   Radiculopathy, lumbar region 11/19/2020   Spondylolisthesis, lumbar region 11/19/2020   Leg cramping 11/06/2020   Degeneration of lumbar intervertebral disc 09/27/2020   Degenerative spondylolisthesis  08/15/2020   Pes anserinus bursitis of right knee 05/07/2020   Ulcer of toe, left, limited to breakdown of skin (HCC) 08/31/2019   Diastolic CHF, chronic (HCC) 08/31/2019   PVD (peripheral vascular disease) 08/31/2019   Bilateral carpal tunnel syndrome 08/19/2019   Posterior neck pain 08/19/2019   Vertigo 04/08/2019   Anemia 04/08/2019   Stiffness of left knee 02/10/2019   Failed total knee, left 02/03/2019   Failed total knee, left, initial encounter 02/03/2019   Carpal tunnel syndrome of left wrist 01/21/2019   Pain of left hand 01/21/2019   Preop exam for internal medicine 11/02/2018   Left hand paresthesia 11/02/2018    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 11/01/2018   Hyperopia with presbyopia, bilateral 06/29/2018   Abdominal pain, lower 03/02/2018   Diarrhea 03/02/2018   Encounter for well adult exam with abnormal findings 11/14/2017   Arnold-Chiari malformation (HCC)    Asthma, well controlled    CAD (coronary artery disease)    COPD (chronic obstructive pulmonary disease) (HCC)    DJD (degenerative joint disease)    History of absence seizures    Hx of colonic polyps    Hypercholesteremia    Positive PPD    Venous insufficiency    Pain in left lower leg 07/20/2017   Cellulitis of left lower extremity 06/18/2017   Bilateral lower extremity edema 04/30/2017   Shingles 02/26/2017   COPD exacerbation (HCC) 01/07/2017   S/P TKR (total knee replacement), bilateral 01/07/2017   Right arm pain 05/16/2015   Generalized anxiety disorder 08/31/2014   Varicose veins of bilateral lower extremities with other complications 01/06/2012   Edema 08/07/2011   MENORRHAGIA, POSTMENOPAUSAL 08/13/2010   CIGARETTE SMOKER 06/11/2010   CARDIAC MURMUR 11/02/2009   Bradycardia 11/01/2009   CHEST PAIN 11/01/2009   Allergic rhinitis 04/19/2009   Chronic obstructive airway disease with asthma (HCC) 04/18/2009   LEG CRAMPS, NOCTURNAL 11/02/2008   Essential hypertension 01/17/2008   COLONIC POLYPS 01/14/2008   Body mass index (BMI) 36.0-36.9, adult 01/14/2008   Reflex sympathetic dystrophy 01/14/2008   Coronary atherosclerosis 01/14/2008   LPRD (laryngopharyngeal reflux disease) 01/14/2008   Osteoarthritis 01/14/2008   BACK PAIN, LUMBAR 01/14/2008   SEIZURES, HX OF 01/14/2008    Onset date: Voice: ~3 months ago,                       Swallowing: Beginning of the year.  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 Pt accompanied by: self  PERTINENT HISTORY: (From 08/31/24 by Dr. Okey) [name] is an 83 year old female with a history of hiatal hernia on EGD 20 yrs ago, who presents with difficulty swallowing.   She has been experiencing difficulty swallowing for the past three months, describing it as a sensation of food not wanting to go down, particularly when taking medication. This occurs most of the time but not always. No recent illness preceded the onset of these symptoms.   A swallow study conducted in August was normal, but it only assessed the upper portion of her swallow. An MRI of  the brain was also normal, though there are some changes noted in the cervical spine.   She has a history of reflux and heartburn for which she is taking medication, and she reports that it helps. She has previously seen a GI doctor and underwent an upper endoscopy in 2005, which revealed a hiatal hernia.   She experiences hoarseness from time to time as well.  PAIN:  Are you having pain? Yes: Pain description: Back pain and arthritis pain.  FALLS: Has patient fallen in last 6 months? No, Number of falls: 0  LIVING ENVIRONMENT: Lives with: grandson Lives in: House/apartment  PLOF:Level of assistance: Independent with ADLs Employment: Retired  PATIENT GOALS: To improve voice and reduce perceived swallowing difficulties.  OBJECTIVE:  Note: Objective measures were completed at Evaluation unless otherwise noted.  DIAGNOSTIC FINDINGS: Flexible Fiberoptic Laryngoscopy Procedure Note (From 10/21/24) Pre-Op Diagnosis: hoarseness                                Post Op Diagnosis: same Procedure: Flexible Fiberoptic Laryngoscopy CPT 31575 - Mod 25 Surgeon: Adah Malkin, D.O. 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 It was passed along the floor of the nasal cavity to the nasopharynx. Torus tubarius was patent and the Fossa of Rosenmller was identified. The scope was then flexed caudally and advanced slowly through the nasopharynx, passed through the oropharynx, and down into the hypopharynx. The patient's oro- and nasopharynx were unremarkable with no signs of any gross lesions, edema, masses, or bleeding.  The base of tongue was visualized and no mass, ulceration or lesion was appreciated. The epiglottis did not demonstrate any mass, ulceration or lesion. The vallecula was also assessed with no mass, ulceration or lesion. The patient had good glottic closure upon phonation and no signs of aspiration or pooling of secretions. The patient was asked to inspire and expire with the true vocal folds vibrating normally and without evidence of vocal fold dysfunction. The right true vocal fold was noted to have a polypoid growth at the anterior 1/3 approaching the commissure. The true and false vocal cords, interarytenoid, AE folds, and arytenoids did not demonstrate any significant edema or erythema. The patient was then asked to valsalva, and the pyriform sinuses were assessed which were unremarkable. The airway was patent and there was no evidence of compromise. The scope was then slowly withdrawn from the patient. The patient tolerated the procedure well and there were no complications.  Disposition: Stable  COGNITION: Overall cognitive status: Within functional limits for tasks assessed   SOCIAL HISTORY: Occupation: Retired Water  intake: Five 12 oz/day. Caffeine/alcohol  intake: minimal and pt is currently drinking 1 soda/not consistently.  Daily  voice use: moderate  PERCEPTUAL VOICE ASSESSMENT: Voice quality: hoarse, breathy, rough, strained, and low vocal intensity Vocal abuse: habitual abnormal pitch Resonance: normal Respiratory function: thoracic breathing GRBAS: G-2, R-2, B-2, A-2, S-1  OBJECTIVE VOICE ASSESSMENT: Maximum phonation time for sustained ah: 2.8 seconds Conversational pitch average: 105 Hz Conversational pitch range:  90-130 Hz Conversational loudness average: 71 dB Conversational loudness range: 61-81 dB S/z ratio: 2.91 (Suggestive  of dysfunction >1.0)  PATIENT REPORTED OUTCOME MEASURES (PROM): V-RQOL: 33/50                                                                                                                            TREATMENT DATE:   10/31/24: Evaluation completed. SLP initiated tx on this date. SLP performed stimulability with pt to measure prognosis with voice therapy exercises. SLP introduced Resonant Voice Therapy (RVT) through /b/ syllables and words. Pt demonstrated ability to achieve clear voicing using RVT. SLP educated pt about basic physiology of the voice (respiration, phonation, articulation/resonance) for optimizing pt understanding of voice exercises. Pt demonstrated emerging understanding of physiology of voice; will need reinforcement in future sessions. Plan is to continue RVT and introduced vocal function exercises (VFEs) for maximizing clear voicing for QOL.    PATIENT EDUCATION: Education details: Physiology of voice Person educated: Patient Education method: Explanation Education comprehension: verbalized understanding  HOME EXERCISE PROGRAM: TBD in upcoming sessions.  GOALS: Goals reviewed with patient? Yes  SHORT TERM GOALS: Target date: 12/03/24  Pt will utilize diaphragmatic breathing during structured exercises and conversation in 80% of opportunities given occasional min A. Baseline: Pt uses thoracic breathing pattern. Goal status: INITIAL  2.  Pt will  achieve clear voicing during structured exercises and conversation in 80% of opportunities across 2 sessions given occasional min A. Baseline:  Goal status: INITIAL  3.  Pt will describe the basic physiology of the voice (Breathing, Phonation, Artic/Resonance) as related to voice therapy exercises with 90% consistency given rare min A.  Baseline:  Goal status: INITIAL  4.  Pt will describe at least 4 swallow precautions/exercises to given min A.  Baseline:  Goal status: INITIAL  5.  Pt will perform structured, sustained phonation exercises with clear voicing in 80% of opportunities when given occasional min A.  Baseline:  Goal status: INITIAL  6.  Pt will complete PROM for swallowing/reflux. Baseline:  Goal status: INITIAL  LONG TERM GOALS: Target date: 01/09/25  Pt will achieve clear voicing during unstructured conversation in 85% of opportunities given rare min A.  Baseline: Pt voice is dysphonic.  Goal status: INITIAL  2.  Pt will report decreased score on PROM (V-RQOL), displaying self-perceived improvement of voice. Baseline: 33/50 Goal status: INITIAL  3.  Pt will describe and utilize swallow precautions to minimize self-reported swallowing difficulties.  Baseline:  Goal status: INITIAL   ASSESSMENT:  CLINICAL IMPRESSION: Patient is a 83 y.o. female who was seen today for a behavioral voice evaluation. Pt presents with a moderate dysphonia characterized by rough, breathy, and hoarse vocal quality and 33/50 on V-RQOL scale. According to PROM, pt frequently experiences depressive thoughts and avoids socializing in public due to dysphonia. Pt noted that her dysphonia has affected her ability to sing. Pt was a regular singer up until 3 months ago when her dysphonia started. In addition, pt reported trouble swallowing, specifically with pills. Pt had MBSS recently (07/08/24), with no significant  findings. Pt does note hx of reflux and that she currently takes medication for  reflux.   OBJECTIVE IMPAIRMENTS: include voice disorder. These impairments are limiting patient from effectively communicating at home and in community. Factors affecting potential to achieve goals and functional outcome are N/A. Patient will benefit from skilled SLP services to address above impairments and improve overall function.  REHAB POTENTIAL: Good  PLAN:  SLP FREQUENCY: 1x/week  SLP DURATION: 10 weeks  PLANNED INTERVENTIONS: Aspiration precaution training, SLP instruction and feedback, Compensatory strategies, and Patient/family education    [employer], CF-SLP 10/31/2024, 4:03 PM

## 2024-11-01 ENCOUNTER — Encounter: Payer: Self-pay | Admitting: Neurosurgery

## 2024-11-01 ENCOUNTER — Ambulatory Visit: Admitting: Neurosurgery

## 2024-11-01 VITALS — BP 124/78 | Ht <= 58 in | Wt 201.0 lb

## 2024-11-01 DIAGNOSIS — M5136 Other intervertebral disc degeneration, lumbar region with discogenic back pain only: Secondary | ICD-10-CM | POA: Diagnosis not present

## 2024-11-01 DIAGNOSIS — G959 Disease of spinal cord, unspecified: Secondary | ICD-10-CM

## 2024-11-01 DIAGNOSIS — M4802 Spinal stenosis, cervical region: Secondary | ICD-10-CM

## 2024-11-01 DIAGNOSIS — M545 Low back pain, unspecified: Secondary | ICD-10-CM

## 2024-11-01 DIAGNOSIS — M4316 Spondylolisthesis, lumbar region: Secondary | ICD-10-CM | POA: Diagnosis not present

## 2024-11-01 DIAGNOSIS — M4312 Spondylolisthesis, cervical region: Secondary | ICD-10-CM

## 2024-11-07 ENCOUNTER — Encounter: Payer: Self-pay | Admitting: Physical Therapy

## 2024-11-07 ENCOUNTER — Ambulatory Visit: Admitting: Physical Therapy

## 2024-11-07 VITALS — BP 119/66 | HR 54

## 2024-11-07 DIAGNOSIS — M6281 Muscle weakness (generalized): Secondary | ICD-10-CM

## 2024-11-07 DIAGNOSIS — R490 Dysphonia: Secondary | ICD-10-CM | POA: Diagnosis not present

## 2024-11-07 DIAGNOSIS — M542 Cervicalgia: Secondary | ICD-10-CM

## 2024-11-07 DIAGNOSIS — R2689 Other abnormalities of gait and mobility: Secondary | ICD-10-CM

## 2024-11-07 NOTE — Therapy (Signed)
 OUTPATIENT PHYSICAL THERAPY NEURO TREATMENT   Patient Name: [name] MRN: [id number] DOB:09/16/1941, 83 y.o., female Today's Date: 11/07/2024  PCP: [name], MD REFERRING PROVIDER: Gregg Lek, MD  END OF SESSION:  PT End of Session - 11/07/24 1454     Visit Number 2    Number of Visits 17   16 plus Eval   Date for Recertification  01/09/25   for scheduling delays   Authorization Type [employer]    PT Start Time [phone]   Therapist running late   PT Stop Time 1535    PT Time Calculation (min) 42 min    Equipment Utilized During Treatment Gait belt    Activity Tolerance Patient tolerated treatment well    Behavior During Therapy WFL for tasks assessed/performed          Past Medical History:  Diagnosis Date   Allergic rhinitis    Arnold-Chiari malformation (HCC)    Asthma    CAD (coronary artery disease)    COPD (chronic obstructive pulmonary disease) (HCC)    Coughing    coughing since last 01-22-2019 , started on cefdinir bid x10 days, has 3 pills left today , reports she feels mcuh better , still coughing copiuus amonts of thick white sputum , denies fever nor chills, nor body aches .    DJD (degenerative joint disease)    GERD (gastroesophageal reflux disease)    Hiatal hernia    History of absence seizures    last confirmed seizure age 89     Hx of colonic polyps    Hypercholesteremia    Hypertension    Obesity    Positive PPD    Reflex sympathetic dystrophy    Sleep apnea    Varicose veins    Venous insufficiency    Past Surgical History:  Procedure Laterality Date   ABDOMINAL HYSTERECTOMY     CHOLECYSTECTOMY N/A 02/19/2023   Procedure: LAPAROSCOPIC CHOLECYSTECTOMY WITH INTRAOPERATIVE CHOLANGIOGRAM AND ICG DYE;  Surgeon: Tanda Locus, MD;  Location: [facility] OR;  Service: General;  Laterality: N/A;   cspine surgery  12/02/1991   for arnold-chiari malformation   IR BONE MARROW BIOPSY & ASPIRATION  01/28/2023   JOINT REPLACEMENT  06/05/2011    Left total knee replacement   right knee arthroscopy  12/02/2007   Dr. Duwayne   right total knee replacement  05/31/2008   Dr. Duwayne   TONSILLECTOMY     TOTAL KNEE REVISION Left 02/03/2019   Procedure: LEFT TOTAL KNEE REVISION;  Surgeon: Fidel Rogue, MD;  Location: [facility] ORS;  Service: Orthopedics;  Laterality: Left;   Patient Active Problem List   Diagnosis Date Noted   Seasonal and perennial allergic rhinitis 07/18/2024   History of smoking 25-50 pack years 07/18/2024   Painful swallowing 07/18/2024   Abnormal CT scan 12/19/2022   Pain of upper abdomen 12/19/2022   Dilation of biliary tract 12/16/2022   Cellulitis of right leg 06/18/2022   Hyperglycemia 06/18/2022   Laceration of right index finger 05/08/2022   Acquired hallux valgus of left foot 11/22/2021   Primary localized osteoarthrosis of ankle and foot 11/22/2021   AKI (acute kidney injury) 03/13/2021   Memory loss 03/13/2021   Anxiety 03/13/2021   Hypokalemia 03/05/2021   Generalized weakness 03/04/2021   Dizziness 03/04/2021   Vitamin D  deficiency 01/16/2021   Cough 12/13/2020   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   Allergic rhinitis due to animal (cat) (dog) hair and dander 12/04/2020   Allergic rhinitis due to pollen 12/04/2020  Moderate persistent asthma, uncomplicated 12/04/2020   Radiculopathy, lumbar region 11/19/2020   Spondylolisthesis, lumbar region 11/19/2020   Leg cramping 11/06/2020   Degeneration of lumbar intervertebral disc 09/27/2020   Degenerative spondylolisthesis 08/15/2020   Pes anserinus bursitis of right knee 05/07/2020   Ulcer of toe, left, limited to breakdown of skin (HCC) 08/31/2019   Diastolic CHF, chronic (HCC) 08/31/2019   PVD (peripheral vascular disease) 08/31/2019   Bilateral carpal tunnel syndrome 08/19/2019   Posterior neck pain 08/19/2019   Vertigo 04/08/2019   Anemia 04/08/2019   Stiffness of left knee 02/10/2019   Failed total knee, left 02/03/2019   Failed total knee, left, initial  encounter 02/03/2019   Carpal tunnel syndrome of left wrist 01/21/2019   Pain of left hand 01/21/2019   Preop exam for internal medicine 11/02/2018   Left hand paresthesia 11/02/2018   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 11/01/2018   Hyperopia with presbyopia, bilateral 06/29/2018   Abdominal pain, lower 03/02/2018   Diarrhea 03/02/2018   Encounter for well adult exam with abnormal findings 11/14/2017   Arnold-Chiari malformation (HCC)    Asthma, well controlled    CAD (coronary artery disease)    COPD (chronic obstructive pulmonary disease) (HCC)    DJD (degenerative joint disease)    History of absence seizures    Hx of colonic polyps    Hypercholesteremia    Positive PPD    Venous insufficiency    Pain in left lower leg 07/20/2017   Cellulitis of left lower extremity 06/18/2017   Bilateral lower extremity edema 04/30/2017   Shingles 02/26/2017   COPD exacerbation (HCC) 01/07/2017   S/P TKR (total knee replacement), bilateral 01/07/2017   Right arm pain 05/16/2015   Generalized anxiety disorder 08/31/2014   Varicose veins of bilateral lower extremities with other complications 01/06/2012   Edema 08/07/2011   MENORRHAGIA, POSTMENOPAUSAL 08/13/2010   CIGARETTE SMOKER 06/11/2010   CARDIAC MURMUR 11/02/2009   Bradycardia 11/01/2009   CHEST PAIN 11/01/2009   Allergic rhinitis 04/19/2009   Chronic obstructive airway disease with asthma (HCC) 04/18/2009   LEG CRAMPS, NOCTURNAL 11/02/2008   Essential hypertension 01/17/2008   COLONIC POLYPS 01/14/2008   Body mass index (BMI) 36.0-36.9, adult 01/14/2008   Reflex sympathetic dystrophy 01/14/2008   Coronary atherosclerosis 01/14/2008   LPRD (laryngopharyngeal reflux disease) 01/14/2008   Osteoarthritis 01/14/2008   BACK PAIN, LUMBAR 01/14/2008   SEIZURES, HX OF 01/14/2008    ONSET DATE: 09/30/2024 (date of referral)  REFERRING DIAG: R53.1 (ICD-10-CM) - Weakness M54.2 (ICD-10-CM) - Cervicalgia  THERAPY DIAG:   Other abnormalities of gait and mobility  Muscle weakness (generalized)  Cervicalgia  Rationale for Evaluation and Treatment: Rehabilitation  SUBJECTIVE:  Likes to be called [name]  SUBJECTIVE STATEMENT: Pt arrived ambulating with RW.  Pt reports having arthritis in hands (feels sore).  No recent falls.  No acute changes.  Saw MD Clois 11/01/24: per note continue PT; pt reporting thinking it is arthritis and therapy may help; per note return in 3 months to neurosurgery for possible posterior cervical intervention. Pt accompanied by: self  PERTINENT HISTORY: Pt with tremors and weakness (noted back in June; went to ED; MRI brain did not show acute abnormalities).  Difficulty eating and holding objects; difficulty writing; tremors worsen during the day.  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  Per PT referral: Cervicalgia. Left side weakness. 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  PAIN:  Are you having pain? Yes: NPRS scale: 7-8/10 Pain location: back, stomach, and legs Pain description: sharp shooting; aching Aggravating factors: standing a lot Relieving factors: pain patches; medication cream  PRECAUTIONS: Fall; Swallowing difficulties (with pills and certain foods; not liquids/water )  RED FLAGS: None   WEIGHT BEARING RESTRICTIONS: No  FALLS: Has patient fallen in last 6 months? No  LIVING ENVIRONMENT: Lives with: lives with their family (grandson) Lives in: House/apartment Stairs: Yes: External: 5 steps; on right going up, on left  going up, and can reach both Has following equipment at home: Single point cane and Walker - 2 wheeled  PLOF: Ambulates with RW (last 6 months or more).  Participated in OP PT first part of this year.  Was doing water  aerobics at [facility] (hasn't done in last 5 months d/t stomach acting up).  PATIENT GOALS: Try to get strength back and do some of the things she was able to do before.  OBJECTIVE:  Note: Objective measures were completed at Evaluation unless otherwise noted.  DIAGNOSTIC FINDINGS:  MRI C-spine 09/26/24: - Multilevel spondylosis, disc bulging, reversal of normal cervical curvature and grade 2 anterior spondylolisthesis of C2 and C3 measuring 7 mm. - At C6-7 disc bulging and facet hypertrophy with moderate to severe spinal stenosis and severe bilateral foraminal stenosis; no cord signal abnormalities. - At C2-3 pseudo disc bulging and facet hypertrophy with moderate spinal stenosis and severe bilateral foraminal stenosis; no cord signal abnormalities. - At C3-4 disc bulging with mild spinal stenosis and severe bilateral foraminal stenosis; no cord signal abnormalities.  COGNITION: Overall cognitive status: Within functional limits for tasks assessed   SENSATION: Decreased light touch sensation L lateral thigh, medial lower leg, and entire foot  COORDINATION: Decreased alternating toe taps L LE compared to R LE  EDEMA: (chronic L>R LE per pt report) Circumferential: malleolar line 27.5 cm R ankle; 28 cm L ankle  POSTURE: rounded shoulders and forward head  LOWER EXTREMITY MMT:    MMT Right Eval Left Eval  Hip flexion 4+/5 4+/5  Hip extension    Hip abduction    Hip adduction    Hip internal rotation    Hip external rotation    Knee flexion    Knee extension 4+/5 4+/5  Ankle dorsiflexion 4+/5 2/5  Ankle plantarflexion At least 3/5 AROM At least 2/5 AROM  Ankle inversion    Ankle eversion    (Blank rows = not tested)  TRANSFERS: Sit to stand: Modified  independence  Assistive device utilized: Environmental Consultant - 2 wheeled  Stand to sit: Modified independence  Assistive device utilized: Environmental Consultant - 2 wheeled      GAIT: Findings: Gait Characteristics: narrow BOS; R LE ER>L LE; foot flat R LE; decreased L LE DF with forefoot initial WB'ing during initial contact L LE; increased L trunk lean; short steps, Distance walked: clinic distances, Assistive device utilized:Walker - 2 wheeled, Level of assistance: Modified independence, and Comments: decreased gait speed  FUNCTIONAL TESTS:  5 times sit to stand: 49.78 seconds with B UE support (up to RW) 10 meter walk test: 0.31 m/sec (31.63 seconds with RW use) BERG Balance Test: 22/56 Item T/est date: 11/07/24 Date:  Date:   Sitting to standing 3. able to stand independently using hands Insert SmartPhrase OPRCBERGREEVAL Insert SmartPhrase OPRCBERGREEVAL  2. Standing unsupported 3. able to stand 2 minutes with supervision    3. Sitting with back unsupported, feet supported 4. able to sit safely and securely for 2 minutes    4. Standing to sitting 3. controls descent by using hands    5. Pivot transfer  3. able to transfer safely with definite need of hands    6. Standing unsupported with eyes closed 3. able to stand 10 seconds with supervision    7. Standing unsupported with feet together 1. needs help to attain position but able to stand 15 seconds feet together    8. Reaching forward with outstretched arms while standing 1. reaches forward but needs supervision    9. Pick up object from the floor from standing 0. unable to try/needs assistance to keep from losing balance or falling; needs assist for balance    10. Turning to look behind over left and right shoulders while standing 1. needs supervision when turning    11. Turn 360 degrees 0. needs assistance while turning    12. Place alternate foot on step or stool while standing unsupported 0. needs assistance to keep from falling/unable to try; assist to keep  balance with 2 attempts    13. Standing unsupported one foot in front 0. loses balance while stepping or standing    14. Standing on one leg 0. unable to try of needs assist to prevent fall ; need assist to maintain balance     Total Score 22/56 Total Score:    Total Score:      PATIENT SURVEYS:  TBA                                                                                                                             TREATMENT DATE: 11/07/24  Self Care: BP and HR taken in sitting at rest beginning of session (see below for details).  Pt reports HR typically 40's to 50's bpm. Vitals:   11/07/24 1501  BP: 119/66  Pulse: (!) 54   Therapeutic Activity: BERG Balance Test: 22/56  (see above for details)  Therapeutic Exercise (issued HEP and performed the following exercises): Seated March x10 reps B LE's Seated Long  Arc Quad  x10 reps B LE's Seated Hip Abduction with Resistance x10 reps B LE's with green thera-band around distal thighs Seated Heel Raise x10 reps B LE's Seated Toe Raise x10 reps B LE's  PATIENT EDUCATION: Education details: BERG balance test results.  Issued HEP and green theraband. Elevate LE's for chronic LE swelling (pt reports she uses compression socks/hose sometimes when she has help). Person educated: Patient Education method: Explanation, Demonstration, Verbal cues, and Handouts Education comprehension: verbalized understanding and needs further education  HOME EXERCISE PROGRAM: Access Code: [email or web address] URL: [email or web address] Date: 11/07/2024 Prepared by: Damien Caulk  Exercises - Seated March  - 1 x daily - 5 x weekly - 1-2 sets - 10 reps - Seated Long Arc Quad  - 1 x daily - 5 x weekly - 1-2 sets - 10 reps - Seated Hip Abduction with Resistance  - 1 x daily - 5 x weekly - 1-2 sets - 10 reps - Seated Heel Raise  - 1 x daily - 5 x weekly - 1-2 sets - 10 reps - Seated Toe Raise  - 1 x daily - 5 x weekly - 1-2 sets - 10  reps  GOALS: Goals reviewed with patient? Yes  SHORT TERM GOALS: Target date: 11/28/2024  Pt will be independent with initial HEP in order to improve strength and balance in order to decrease fall risk and improve function at home for ADL's. Baseline: TBA Goal status: INITIAL  2.  Assess BERG Balance Test and update LTG as needed. Baseline: 22/56 11/07/24 Goal status: MET  3.  Pt will decrease 5 Time Sit to Stand by at least 10 seconds in order to demonstrate clinically significant improvement in LE strength. Baseline: 49.78 seconds (Eval) Goal status: INITIAL   LONG TERM GOALS: Target date: 12/26/2024  Pt will be independent with HEP in order to improve strength and balance in order to decrease fall risk and improve function at home for ADL's. Baseline: TBA Goal status: INITIAL  2.  Patient will demonstrate an improved Berg Balance Score of 40 or greater as to demonstrate improved balance with ADLs and reduced fall risk. Baseline: 22/56 11/07/24 Goal status: REVISED  3.  Pt will decrease 5 Time Sit to Stand by at least 20 seconds in order to demonstrate clinically significant improvement in LE strength. Baseline: 49.78 seconds (Eval) Goal status: INITIAL  4.  Pt will increase by at least 0.13 m/s in order to demonstrate clinically significant improvement in community ambulation.  Baseline: 0.31 m/sec Goal status: INITIAL  5.  Pt will demonstrate improved L LE foot clearance for safety with gait. Baseline: Decreased L foot clearance with RW use Goal status: INITIAL  ASSESSMENT:  CLINICAL IMPRESSION: Patient was seen today for follow up physical therapy treatment to address outcome measures and HEP.  Focused session on BERG Balance test and issuing HEP.  Pt requiring pacing and rest breaks during BERG Balance test d/t fatigue.  Pt scored 22/56 on BERG Balance test indicating pt is at a high fall risk (<40/56 = high fall risk; 40-45/56 = moderate fall risk; >45/56 = low  fall risk).  Patient continues to be limited by strength, balance, and activity tolerance.  They demonstrate appropriate understanding of issued HEP.  They would continue to benefit from skilled PT to address impairments as noted and progress towards long term goals.  OBJECTIVE IMPAIRMENTS: Abnormal gait, cardiopulmonary status limiting activity, decreased activity tolerance, decreased balance, decreased coordination, decreased endurance, decreased knowledge of condition, decreased knowledge  of use of DME, decreased mobility, difficulty walking, decreased ROM, decreased strength, increased edema, impaired flexibility, impaired sensation, improper body mechanics, postural dysfunction, and pain.   ACTIVITY LIMITATIONS: carrying, lifting, bending, standing, squatting, stairs, transfers, bed mobility, continence, toileting, dressing, locomotion level, and caring for others  PARTICIPATION LIMITATIONS: meal prep, cleaning, laundry, shopping, community activity, and yard work  PERSONAL FACTORS: Age, Fitness, Past/current experiences, and 1-2 comorbidities: COPD, HFpEF, bradycardia, and memory loss are also affecting patient's functional outcome.   REHAB POTENTIAL: Good  CLINICAL DECISION MAKING: Evolving/moderate complexity  EVALUATION COMPLEXITY: Moderate  PLAN:  PT FREQUENCY: 2x/week  PT DURATION: 8 weeks  PLANNED INTERVENTIONS: [id number]- PT Re-evaluation, 97750- Physical Performance Testing, 97110-Therapeutic exercises, 97530- Therapeutic activity, [id number]- Neuromuscular re-education, 97535- Self Care, [id number]- Manual therapy, [id number]- Gait training, [phone]- Orthotic Initial, [phone]- Orthotic/Prosthetic subsequent, [phone]- Aquatic Therapy, [phone]- Electrical stimulation (manual), [id number]- Ultrasound, [id number]- Traction (mechanical), [phone] (1-2 muscles), [id number] (3+ muscles)- Dry Needling, Patient/Family education, Balance training, Stair training, Taping, Joint mobilization, Spinal mobilization, Compression bandaging,  Cognitive remediation, DME instructions, Cryotherapy, Moist heat, and Biofeedback  PLAN FOR NEXT SESSION: Check on HEP; balance, strengthening, aerobic endurance; coordination.   Damien Caulk, PT 11/07/2024, 6:37 PM

## 2024-11-08 ENCOUNTER — Encounter: Payer: Self-pay | Admitting: Allergy and Immunology

## 2024-11-08 ENCOUNTER — Other Ambulatory Visit: Payer: Self-pay

## 2024-11-08 ENCOUNTER — Ambulatory Visit: Admitting: Allergy and Immunology

## 2024-11-08 VITALS — BP 122/70 | HR 42 | Temp 97.9°F | Resp 12 | Ht <= 58 in | Wt 212.4 lb

## 2024-11-08 DIAGNOSIS — J302 Other seasonal allergic rhinitis: Secondary | ICD-10-CM

## 2024-11-08 DIAGNOSIS — K219 Gastro-esophageal reflux disease without esophagitis: Secondary | ICD-10-CM | POA: Diagnosis not present

## 2024-11-08 DIAGNOSIS — J3089 Other allergic rhinitis: Secondary | ICD-10-CM | POA: Diagnosis not present

## 2024-11-08 DIAGNOSIS — J4540 Moderate persistent asthma, uncomplicated: Secondary | ICD-10-CM

## 2024-11-08 MED ORDER — BREZTRI AEROSPHERE 160-9-4.8 MCG/ACT IN AERO: 2.0000 | INHALATION_SPRAY | Freq: Two times a day (BID) | RESPIRATORY_TRACT | 1 refills | Status: AC

## 2024-11-08 MED ORDER — MONTELUKAST SODIUM 10 MG PO TABS: 10.0000 mg | ORAL_TABLET | Freq: Every day | ORAL | 2 refills | Status: AC

## 2024-11-08 MED ORDER — ALBUTEROL SULFATE HFA 108 (90 BASE) MCG/ACT IN AERS: 2.0000 | INHALATION_SPRAY | RESPIRATORY_TRACT | 1 refills | Status: AC | PRN

## 2024-11-08 MED ORDER — PANTOPRAZOLE SODIUM 40 MG PO TBEC: 40.0000 mg | DELAYED_RELEASE_TABLET | Freq: Every morning | ORAL | 1 refills | Status: AC

## 2024-11-08 MED ORDER — FAMOTIDINE 40 MG PO TABS: 40.0000 mg | ORAL_TABLET | Freq: Every evening | ORAL | 1 refills | Status: AC

## 2024-11-08 MED ORDER — FLUTICASONE PROPIONATE 50 MCG/ACT NA SUSP: 1.0000 | Freq: Two times a day (BID) | NASAL | 1 refills | Status: AC

## 2024-11-08 MED ORDER — LORATADINE 10 MG PO TABS: 10.0000 mg | ORAL_TABLET | Freq: Every day | ORAL | 2 refills | Status: AC

## 2024-11-08 NOTE — Progress Notes (Unsigned)
 [place] - [place] - [place] - [place] - [place]   Follow-up Note  Referring Provider: [name], MD Primary Provider: [name], MD Date of Office Visit: 11/08/2024  Subjective:   [name] (DOB: 08/28/1941) is a 83 y.o. female who [facility] in re-evaluation of the following:  HPI: [name] returns to this clinic in evaluation of asthma, allergic rhinitis, LPR, hoarseness and painful swallowing.  I last saw her in this clinic 21 June 2024.  She has seen our nurse practitioner on 15 August 2024.  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  She has only had 1 session to date and she will be having subsequent sessions over the next several weeks.  Right now she is about the same although she might not have as much pain when she swallows.  Her asthma is been under very good control and she rarely if ever uses her short acting bronchodilator.  She cannot really exert herself very much because she has a lower back problem which and she is also undergoing physical therapy for that issue.  Her nose is doing pretty well.  It does not sound as though she has required a systemic steroid or an antibiotic for any type of airway issue.  Allergies as of 11/08/2024   No Known Allergies      Medication List    acetaminophen  500 MG tablet Commonly known as: TYLENOL  Take 1,000 mg by mouth every 6 (six) hours as needed for moderate pain.   albuterol  108 (90 Base) MCG/ACT inhaler Commonly known as: VENTOLIN  HFA Inhale 2 puffs into the lungs every 4 (four) hours as needed. For wheeze or shortness of breath   alendronate  70 MG tablet Commonly known as: FOSAMAX  Take 70 mg by mouth once a week.   aspirin  81 MG tablet Take 81 mg by mouth daily.   azelastine  0.1 % nasal spray Commonly known as: ASTELIN  Place 1 spray  into both nostrils 2 (two) times daily as needed for rhinitis or allergies.   Breztri  Aerosphere 160-9-4.8 MCG/ACT Aero inhaler Generic drug: budesonide -glycopyrrolate -formoterol  Inhale 2 puffs into the lungs in the morning and at bedtime.   budesonide -formoterol  80-4.5 MCG/ACT inhaler Commonly known as: SYMBICORT  Inhale 2 puffs into the lungs 2 (two) times daily as needed (shortness of breath).   [employer] D3 50 MCG (2000 UT) Caps Generic drug: Cholecalciferol Take 1 capsule by mouth daily.   cyanocobalamin  1000 MCG tablet Commonly known as: VITAMIN B12 Take 1,000 mcg by mouth daily.   donepezil  5 MG tablet Commonly known as: ARICEPT  Take 5 mg by mouth at bedtime.   EPINEPHrine  0.3 mg/0.3 mL Soaj injection Commonly known as: EPI-PEN Inject 0.3 mg into the muscle as needed for anaphylaxis.   famotidine  40 MG tablet Commonly known as: PEPCID  Take 1 tablet (40 mg total) by mouth at bedtime.   ferrous sulfate  325 (65 FE) MG EC tablet Take 325 mg by mouth daily.   fluticasone  50 MCG/ACT nasal spray Commonly known as: FLONASE  Place 1 spray into both nostrils in the morning and at bedtime.   lidocaine  5 % Commonly known as: Lidoderm  Place 1 patch onto the skin daily. Remove & Discard patch within 12 hours or as directed by MD   loperamide 2 MG tablet Commonly known as: IMODIUM A-D Take 2 mg by mouth as  needed for diarrhea or loose stools.   montelukast  10 MG tablet Commonly known as: SINGULAIR  Take 1 tablet (10 mg total) by mouth at bedtime.   mupirocin  cream 2 % Commonly known as: BACTROBAN  Apply 1 Application topically 2 (two) times daily.   pantoprazole  40 MG tablet Commonly known as: PROTONIX  Take 1 tablet (40 mg total) by mouth every morning. TAKE 1 TABLET BY MOUTH EVERY DAY   potassium chloride  SA 20 MEQ tablet Commonly known as: Klor-Con  M20 Take 1 tablet (20 mEq total) by mouth 2 (two) times daily.   povidone-iodine  10 % external solution Commonly known as:  Betadine  Paint  in webspace next to left great toe once daily   rosuvastatin  40 MG tablet Commonly known as: CRESTOR  Take 1 tablet (40 mg total) by mouth daily.   Spiriva Respimat 1.25 MCG/ACT Aers Generic drug: Tiotropium Bromide Inhale 1.25 mcg into the lungs daily.   tiZANidine  2 MG tablet Commonly known as: ZANAFLEX  Take 1 tablet (2 mg total) by mouth every 6 (six) hours as needed for muscle spasms.   Torsemide  40 MG Tabs Take 1 tablet (40 mg) by mouth daily every morning What changed: additional instructions   traZODone  100 MG tablet Commonly known as: DESYREL  TAKE ONE TABLET BY MOUTH EVERYDAY AT BEDTIME   triamcinolone  cream 0.1 % Commonly known as: KENALOG  Apply 1 Application topically as needed.    Past Medical History:  Diagnosis Date   Allergic rhinitis    Arnold-Chiari malformation (HCC)    Asthma    CAD (coronary artery disease)    COPD (chronic obstructive pulmonary disease) (HCC)    Coughing    coughing since last 01-22-2019 , started on cefdinir bid x10 days, has 3 pills left today , reports she feels mcuh better , still coughing copiuus amonts of thick white sputum , denies fever nor chills, nor body aches .    DJD (degenerative joint disease)    GERD (gastroesophageal reflux disease)    Hiatal hernia    History of absence seizures    last confirmed seizure age 38     Hx of colonic polyps    Hypercholesteremia    Hypertension    Obesity    Positive PPD    Reflex sympathetic dystrophy    Sleep apnea    Varicose veins    Venous insufficiency     Past Surgical History:  Procedure Laterality Date   ABDOMINAL HYSTERECTOMY     CHOLECYSTECTOMY N/A 02/19/2023   Procedure: LAPAROSCOPIC CHOLECYSTECTOMY WITH INTRAOPERATIVE CHOLANGIOGRAM AND ICG DYE;  Surgeon: Tanda Locus, MD;  Location: [facility] OR;  Service: General;  Laterality: N/A;   cspine surgery  12/02/1991   for arnold-chiari malformation   IR BONE MARROW BIOPSY & ASPIRATION  01/28/2023   JOINT  REPLACEMENT  06/05/2011   Left total knee replacement   right knee arthroscopy  12/02/2007   Dr. Duwayne   right total knee replacement  05/31/2008   Dr. Duwayne   TONSILLECTOMY     TOTAL KNEE REVISION Left 02/03/2019   Procedure: LEFT TOTAL KNEE REVISION;  Surgeon: Fidel Rogue, MD;  Location: [facility] ORS;  Service: Orthopedics;  Laterality: Left;    Review of systems negative except as noted in HPI / PMHx or noted below:  Review of Systems  Constitutional: Negative.   HENT: Negative.    Eyes: Negative.   Respiratory: Negative.    Cardiovascular: Negative.   Gastrointestinal: Negative.   Genitourinary: Negative.   Musculoskeletal: Negative.   Skin: Negative.  Neurological: Negative.   Endo/Heme/Allergies: Negative.   Psychiatric/Behavioral: Negative.       Objective:   Vitals:   11/08/24 1127  BP: 122/70  Pulse: (!) 42  Resp: 12  Temp: 97.9 F (36.6 C)  SpO2: 98%   Height: 4' 9.87 (147 cm)  Weight: 212 lb 6.4 oz (96.3 kg)   Physical Exam Constitutional:      Appearance: She is not diaphoretic.  HENT:     Head: Normocephalic.     Right Ear: External ear normal.     Left Ear: External ear normal.     Nose: Nose normal. No mucosal edema or rhinorrhea.     Mouth/Throat:     Pharynx: Uvula midline. No oropharyngeal exudate.  Eyes:     Conjunctiva/sclera: Conjunctivae normal.  Neck:     Thyroid : No thyromegaly.     Trachea: Trachea normal. No tracheal tenderness or tracheal deviation.  Cardiovascular:     Rate and Rhythm: Normal rate and regular rhythm.     Heart sounds: Normal heart sounds, S1 normal and S2 normal. No murmur heard. Pulmonary:     Effort: No respiratory distress.     Breath sounds: Normal breath sounds. No stridor. No wheezing or rales.  Lymphadenopathy:     Head:     Right side of head: No tonsillar adenopathy.     Left side of head: No tonsillar adenopathy.     Cervical: No cervical adenopathy.  Skin:    Findings: No erythema or rash.      Nails: There is no clubbing.  Neurological:     Mental Status: She is alert.     Diagnostics:    Spirometry was performed and demonstrated an FEV1 of 1.07 at 81 % of predicted.  Results of a rhinoscopy performed 31 August 2024 identified the following:  The nasal cavity was patent without rhinorrhea or polyp. The nasopharynx was also patent without mass or lesion. The base of tongue was visualized and was normal. There were no signs of pooling of secretions in the piriform sinuses. The true vocal folds were mobile bilaterally. There were two hemorrhagic polyps larger one on the right side. 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   Results of a modified barium swallow obtained 08 July 2024 identified the following:  Pt presents with functional oropharyngeal swallow with normal oral phase, normal propulsion of materials through pharynx with no residue post-swallow, no aspiration, transient and trace penetration of thin liquids into the larynx without descent to the vocal folds and often ejected upon completion of the swallow (PAS score of 2 and 3). Pt was asymptomatic during study and physiology of swallow was quite functional.   Assessment and Plan:   1. Asthma, moderate persistent, well-controlled   2. Seasonal and perennial allergic rhinitis   3. LPRD (laryngopharyngeal reflux disease)    1.  Allergen avoidance measures - pollen, cat, and dog  2. Treat and prevent inflammation of airway:   A. Breztri  - 2 inhalations 2 times per day (empty lungs).    B. Fluticasone  - 1 spray each nostril 2 times per day  C. Montelukast  10 mg daily  3. Treat and prevent reflux/LPR and vocal cord polyps:   A. Pantoprazole  40 mg - 1 tablet in AM  B. Famotidine  40 mg - 1 tablet in PM  C. Eliminate caffeine / chocolate consumption  D. Speech therapy  4. If needed:   A. Albuterol  - 2 inhalations every 4-6 hours  B.  Loratadine  10 - 1 tablet 1 time per day  C. Nasal  saline rinses  5. Return to clinic in 3 months or earlier if problem. Taper medications???  6. Influenza = Tamiflu. Covid = Paxlovid  Adayah is doing pretty well with her airway although certainly her throat issue is still active.  She is undergoing speech therapy and hopefully with several sessions she will resolve most of her throat problem.  Hopefully there will be an opportunity to consolidate some of her medical therapy when I see her back in this clinic in 3 months.  For now she will continue on anti-inflammatory agents for both her upper and lower airway and also continue to aggressively treat her LPR.  Camellia Denis, MD Allergy / Immunology [facility]

## 2024-11-08 NOTE — Patient Instructions (Addendum)
  1.  Allergen avoidance measures - pollen, cat, and dog  2. Treat and prevent inflammation of airway:   A. Breztri  - 2 inhalations 2 times per day (empty lungs).    B. Fluticasone  - 1 spray each nostril 2 times per day  C. Montelukast  10 mg daily  3. Treat and prevent reflux/LPR and vocal cord polyps:   A. Pantoprazole  40 mg - 1 tablet in AM  B. Famotidine  40 mg - 1 tablet in PM  C. Eliminate caffeine / chocolate consumption  D. Speech therapy  4. If needed:   A. Albuterol  - 2 inhalations every 4-6 hours  B. Loratadine  10 - 1 tablet 1 time per day  C. Nasal saline rinses  5. Return to clinic in 3 months or earlier if problem. Taper medications???  6. Influenza = Tamiflu. Covid = Paxlovid

## 2024-11-09 ENCOUNTER — Encounter: Payer: Self-pay | Admitting: Allergy and Immunology

## 2024-11-09 ENCOUNTER — Ambulatory Visit: Admitting: Physical Therapy

## 2024-11-09 ENCOUNTER — Encounter: Payer: Self-pay | Admitting: Physical Therapy

## 2024-11-09 ENCOUNTER — Ambulatory Visit

## 2024-11-09 VITALS — BP 118/71 | HR 50

## 2024-11-09 DIAGNOSIS — R490 Dysphonia: Secondary | ICD-10-CM

## 2024-11-09 DIAGNOSIS — M6281 Muscle weakness (generalized): Secondary | ICD-10-CM

## 2024-11-09 DIAGNOSIS — R2689 Other abnormalities of gait and mobility: Secondary | ICD-10-CM

## 2024-11-09 NOTE — Therapy (Signed)
 OUTPATIENT PHYSICAL THERAPY NEURO TREATMENT   Patient Name: [name] MRN: [id number] DOB:01/01/1941, 83 y.o., female Today's Date: 11/09/2024  PCP: [name], MD REFERRING PROVIDER: Gregg Lek, MD  END OF SESSION:  PT End of Session - 11/09/24 1407     Visit Number 3    Number of Visits 17   16 plus Eval   Date for Recertification  01/09/25   for scheduling delays   Authorization Type [employer]    PT Start Time [phone]   therapist running late   PT Stop Time 1445    PT Time Calculation (min) 40 min    Equipment Utilized During Treatment Gait belt    Activity Tolerance Patient tolerated treatment well   with pacing/rest breaks   Behavior During Therapy WFL for tasks assessed/performed          Past Medical History:  Diagnosis Date   Allergic rhinitis    Arnold-Chiari malformation (HCC)    Asthma    CAD (coronary artery disease)    COPD (chronic obstructive pulmonary disease) (HCC)    Coughing    coughing since last 01-22-2019 , started on cefdinir bid x10 days, has 3 pills left today , reports she feels mcuh better , still coughing copiuus amonts of thick white sputum , denies fever nor chills, nor body aches .    DJD (degenerative joint disease)    GERD (gastroesophageal reflux disease)    Hiatal hernia    History of absence seizures    last confirmed seizure age 55     Hx of colonic polyps    Hypercholesteremia    Hypertension    Obesity    Positive PPD    Reflex sympathetic dystrophy    Sleep apnea    Varicose veins    Venous insufficiency    Past Surgical History:  Procedure Laterality Date   ABDOMINAL HYSTERECTOMY     CHOLECYSTECTOMY N/A 02/19/2023   Procedure: LAPAROSCOPIC CHOLECYSTECTOMY WITH INTRAOPERATIVE CHOLANGIOGRAM AND ICG DYE;  Surgeon: Tanda Locus, MD;  Location: [facility] OR;  Service: General;  Laterality: N/A;   cspine surgery  12/02/1991   for arnold-chiari malformation   IR BONE MARROW BIOPSY & ASPIRATION  01/28/2023   JOINT  REPLACEMENT  06/05/2011   Left total knee replacement   right knee arthroscopy  12/02/2007   Dr. Duwayne   right total knee replacement  05/31/2008   Dr. Duwayne   TONSILLECTOMY     TOTAL KNEE REVISION Left 02/03/2019   Procedure: LEFT TOTAL KNEE REVISION;  Surgeon: Fidel Rogue, MD;  Location: [facility] ORS;  Service: Orthopedics;  Laterality: Left;   Patient Active Problem List   Diagnosis Date Noted   Seasonal and perennial allergic rhinitis 07/18/2024   History of smoking 25-50 pack years 07/18/2024   Painful swallowing 07/18/2024   Abnormal CT scan 12/19/2022   Pain of upper abdomen 12/19/2022   Dilation of biliary tract 12/16/2022   Cellulitis of right leg 06/18/2022   Hyperglycemia 06/18/2022   Laceration of right index finger 05/08/2022   Acquired hallux valgus of left foot 11/22/2021   Primary localized osteoarthrosis of ankle and foot 11/22/2021   AKI (acute kidney injury) 03/13/2021   Memory loss 03/13/2021   Anxiety 03/13/2021   Hypokalemia 03/05/2021   Generalized weakness 03/04/2021   Dizziness 03/04/2021   Vitamin D  deficiency 01/16/2021   Cough 12/13/2020   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   Allergic rhinitis due to animal (cat) (dog) hair and dander 12/04/2020   Allergic rhinitis  due to pollen 12/04/2020   Moderate persistent asthma, uncomplicated 12/04/2020   Radiculopathy, lumbar region 11/19/2020   Spondylolisthesis, lumbar region 11/19/2020   Leg cramping 11/06/2020   Degeneration of lumbar intervertebral disc 09/27/2020   Degenerative spondylolisthesis 08/15/2020   Pes anserinus bursitis of right knee 05/07/2020   Ulcer of toe, left, limited to breakdown of skin (HCC) 08/31/2019   Diastolic CHF, chronic (HCC) 08/31/2019   PVD (peripheral vascular disease) 08/31/2019   Bilateral carpal tunnel syndrome 08/19/2019   Posterior neck pain 08/19/2019   Vertigo 04/08/2019   Anemia 04/08/2019   Stiffness of left knee 02/10/2019   Failed total knee, left 02/03/2019   Failed  total knee, left, initial encounter 02/03/2019   Carpal tunnel syndrome of left wrist 01/21/2019   Pain of left hand 01/21/2019   Preop exam for internal medicine 11/02/2018   Left hand paresthesia 11/02/2018   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 11/01/2018   Hyperopia with presbyopia, bilateral 06/29/2018   Abdominal pain, lower 03/02/2018   Diarrhea 03/02/2018   Encounter for well adult exam with abnormal findings 11/14/2017   Arnold-Chiari malformation (HCC)    Asthma, well controlled    CAD (coronary artery disease)    COPD (chronic obstructive pulmonary disease) (HCC)    DJD (degenerative joint disease)    History of absence seizures    Hx of colonic polyps    Hypercholesteremia    Positive PPD    Venous insufficiency    Pain in left lower leg 07/20/2017   Cellulitis of left lower extremity 06/18/2017   Bilateral lower extremity edema 04/30/2017   Shingles 02/26/2017   COPD exacerbation (HCC) 01/07/2017   S/P TKR (total knee replacement), bilateral 01/07/2017   Right arm pain 05/16/2015   Generalized anxiety disorder 08/31/2014   Varicose veins of bilateral lower extremities with other complications 01/06/2012   Edema 08/07/2011   MENORRHAGIA, POSTMENOPAUSAL 08/13/2010   CIGARETTE SMOKER 06/11/2010   CARDIAC MURMUR 11/02/2009   Bradycardia 11/01/2009   CHEST PAIN 11/01/2009   Allergic rhinitis 04/19/2009   Chronic obstructive airway disease with asthma (HCC) 04/18/2009   LEG CRAMPS, NOCTURNAL 11/02/2008   Essential hypertension 01/17/2008   COLONIC POLYPS 01/14/2008   Body mass index (BMI) 36.0-36.9, adult 01/14/2008   Reflex sympathetic dystrophy 01/14/2008   Coronary atherosclerosis 01/14/2008   LPRD (laryngopharyngeal reflux disease) 01/14/2008   Osteoarthritis 01/14/2008   BACK PAIN, LUMBAR 01/14/2008   SEIZURES, HX OF 01/14/2008    ONSET DATE: 09/30/2024 (date of referral)  REFERRING DIAG: R53.1 (ICD-10-CM) - Weakness M54.2 (ICD-10-CM) -  Cervicalgia  THERAPY DIAG:  Other abnormalities of gait and mobility  Muscle weakness (generalized)  Rationale for Evaluation and Treatment: Rehabilitation  SUBJECTIVE:  Likes to be called [name]  SUBJECTIVE STATEMENT: No recent falls; still having pain in her back.  Having pain on inside of L cheek (7/10) but nothing concerning noted.  Did HEP this morning; more difficult with L LE; no questions reported. Pt accompanied by: self  PERTINENT HISTORY: Pt with tremors and weakness (noted back in June; went to ED; MRI brain did not show acute abnormalities).  Difficulty eating and holding objects; difficulty writing; tremors worsen during the day.  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  Per PT referral: Cervicalgia. Left side weakness. 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  PAIN:  Are you having pain? Yes: NPRS scale: 7/10 Pain location: back, stomach, and legs Pain description: sharp shooting; aching Aggravating factors: standing a lot Relieving factors: pain patches; medication cream  PRECAUTIONS: Fall; Swallowing difficulties (with pills and certain foods; not liquids/water )  RED FLAGS: None   WEIGHT BEARING RESTRICTIONS: No  FALLS: Has patient fallen in last 6 months? No  LIVING ENVIRONMENT: Lives with: lives with their family (grandson) Lives in: House/apartment Stairs: Yes: External: 5 steps; on right going up, on left going up, and can reach both Has following equipment at home: Single point cane and Walker - 2 wheeled  PLOF:  Ambulates with RW (last 6 months or more).  Participated in OP PT first part of this year.  Was doing water  aerobics at [facility] (hasn't done in last 5 months d/t stomach acting up).  PATIENT GOALS: Try to get strength back and do some of the things she was able to do before.  OBJECTIVE:  Note: Objective measures were completed at Evaluation unless otherwise noted.  DIAGNOSTIC FINDINGS:  MRI C-spine 09/26/24: - Multilevel spondylosis, disc bulging, reversal of normal cervical curvature and grade 2 anterior spondylolisthesis of C2 and C3 measuring 7 mm. - At C6-7 disc bulging and facet hypertrophy with moderate to severe spinal stenosis and severe bilateral foraminal stenosis; no cord signal abnormalities. - At C2-3 pseudo disc bulging and facet hypertrophy with moderate spinal stenosis and severe bilateral foraminal stenosis; no cord signal abnormalities. - At C3-4 disc bulging with mild spinal stenosis and severe bilateral foraminal stenosis; no cord signal abnormalities.  COGNITION: Overall cognitive status: Within functional limits for tasks assessed   SENSATION: Decreased light touch sensation L lateral thigh, medial lower leg, and entire foot  COORDINATION: Decreased alternating toe taps L LE compared to R LE  EDEMA: (chronic L>R LE per pt report) Circumferential: malleolar line 27.5 cm R ankle; 28 cm L ankle  POSTURE: rounded shoulders and forward head  LOWER EXTREMITY MMT:    MMT Right Eval Left Eval  Hip flexion 4+/5 4+/5  Hip extension    Hip abduction    Hip adduction    Hip internal rotation    Hip external rotation    Knee flexion    Knee extension 4+/5 4+/5  Ankle dorsiflexion 4+/5 2/5  Ankle plantarflexion At least 3/5 AROM At least 2/5 AROM  Ankle inversion    Ankle eversion    (Blank rows = not tested)  TRANSFERS: Sit to stand: Modified independence  Assistive device utilized: [employer] - 2 wheeled     Stand to sit: Modified independence  Assistive device  utilized: Environmental Consultant - 2 wheeled      GAIT:  Findings: Gait Characteristics: narrow BOS; R LE ER>L LE; foot flat R LE; decreased L LE DF with forefoot initial WB'ing during initial contact L LE; increased L trunk lean; short steps, Distance walked: clinic distances, Assistive device utilized:Walker - 2 wheeled, Level of assistance: Modified independence, and Comments: decreased gait speed  FUNCTIONAL TESTS:  5 times sit to stand: 49.78 seconds with B UE support (up to RW) 10 meter walk test: 0.31 m/sec (31.63 seconds with RW use) BERG Balance Test: 22/56 Item T/est date: 11/07/24 Date:  Date:   Sitting to standing 3. able to stand independently using hands Insert SmartPhrase OPRCBERGREEVAL Insert SmartPhrase OPRCBERGREEVAL  2. Standing unsupported 3. able to stand 2 minutes with supervision    3. Sitting with back unsupported, feet supported 4. able to sit safely and securely for 2 minutes    4. Standing to sitting 3. controls descent by using hands    5. Pivot transfer  3. able to transfer safely with definite need of hands    6. Standing unsupported with eyes closed 3. able to stand 10 seconds with supervision    7. Standing unsupported with feet together 1. needs help to attain position but able to stand 15 seconds feet together    8. Reaching forward with outstretched arms while standing 1. reaches forward but needs supervision    9. Pick up object from the floor from standing 0. unable to try/needs assistance to keep from losing balance or falling; needs assist for balance    10. Turning to look behind over left and right shoulders while standing 1. needs supervision when turning    11. Turn 360 degrees 0. needs assistance while turning    12. Place alternate foot on step or stool while standing unsupported 0. needs assistance to keep from falling/unable to try; assist to keep balance with 2 attempts    13. Standing unsupported one foot in front 0. loses balance while stepping or standing     14. Standing on one leg 0. unable to try of needs assist to prevent fall ; need assist to maintain balance     Total Score 22/56 Total Score:    Total Score:      PATIENT SURVEYS:  TBA                                                                                                                             TREATMENT DATE: 11/09/24  Self Care BP and HR taken in sitting at rest beginning of session (see below for details).  Pt reports her HR is always low (50's to 60's bpm).  Pt asymptomatic; monitored for symptoms during session. Vitals:   11/09/24 1412  BP: 118/71  Pulse: (!) 50  Reviewed pacing with activities d/t generalized weakness.  Therapeutic Exercise: SciFit multi-peaks up to level 2 for 8 minutes using BUE/BLEs for global strengthening, dynamic cardiovascular warmup and increased amplitude of stepping. RPE of 5/10 with HR 56  bpm following activity.  Average stride length 7.3 inches.   Therapeutic Activity: Standing balance: Semi-tandem stance: x1 minute x 4 trials (x2 trials with R foot forward; x2 trials with L foot forward; no UE support) Notes: more unsteady with L foot forward; CGA for safety Ambulation clinic distances with RW: decreased gait speed noted with decreased L LE DF; SBA    PATIENT EDUCATION: Education details: Continue HEP. Person educated: Patient Education method: Explanation, Demonstration, Verbal cues, and Handouts Education comprehension: verbalized understanding and needs further education  HOME EXERCISE PROGRAM: Access Code: [email or web address] URL: [email or web address] Date: 11/07/2024 Prepared by: Damien Caulk  Exercises - Seated March  - 1 x daily - 5 x weekly - 1-2 sets - 10 reps - Seated Long Arc Quad  - 1 x daily - 5 x weekly - 1-2 sets - 10 reps - Seated Hip Abduction with Resistance  - 1 x daily - 5 x weekly - 1-2 sets - 10 reps - Seated Heel Raise  - 1 x daily - 5 x weekly - 1-2 sets - 10 reps - Seated Toe Raise  - 1 x  daily - 5 x weekly - 1-2 sets - 10 reps  GOALS: Goals reviewed with patient? Yes  SHORT TERM GOALS: Target date: 11/28/2024  Pt will be independent with initial HEP in order to improve strength and balance in order to decrease fall risk and improve function at home for ADL's. Baseline: TBA Goal status: INITIAL  2.  Assess BERG Balance Test and update LTG as needed. Baseline: 22/56 11/07/24 Goal status: MET  3.  Pt will decrease 5 Time Sit to Stand by at least 10 seconds in order to demonstrate clinically significant improvement in LE strength. Baseline: 49.78 seconds (Eval) Goal status: INITIAL   LONG TERM GOALS: Target date: 12/26/2024  Pt will be independent with HEP in order to improve strength and balance in order to decrease fall risk and improve function at home for ADL's. Baseline: TBA Goal status: INITIAL  2.  Patient will demonstrate an improved Berg Balance Score of 40 or greater as to demonstrate improved balance with ADLs and reduced fall risk. Baseline: 22/56 11/07/24 Goal status: REVISED  3.  Pt will decrease 5 Time Sit to Stand by at least 20 seconds in order to demonstrate clinically significant improvement in LE strength. Baseline: 49.78 seconds (Eval) Goal status: INITIAL  4.  Pt will increase by at least 0.13 m/s in order to demonstrate clinically significant improvement in community ambulation.  Baseline: 0.31 m/sec Goal status: INITIAL  5.  Pt will demonstrate improved L LE foot clearance for safety with gait. Baseline: Decreased L foot clearance with RW use Goal status: INITIAL  ASSESSMENT:  CLINICAL IMPRESSION: Patient was seen today for physical therapy treatment to address strength and balance.  Focused session on improving activity tolerance, overall strengthening, and standing balance.  Patient continues to be limited by strength and balance.  Pt requiring pacing and rest breaks during session.  They demonstrate appropriate effort and  motivation during session.  They would continue to benefit from skilled PT to address impairments as noted and progress towards long term goals.  OBJECTIVE IMPAIRMENTS: Abnormal gait, cardiopulmonary status limiting activity, decreased activity tolerance, decreased balance, decreased coordination, decreased endurance, decreased knowledge of condition, decreased knowledge of use of DME, decreased mobility, difficulty walking, decreased ROM, decreased strength, increased edema, impaired flexibility, impaired sensation, improper body mechanics, postural dysfunction, and pain.   ACTIVITY LIMITATIONS: carrying, lifting, bending, standing, squatting,  stairs, transfers, bed mobility, continence, toileting, dressing, locomotion level, and caring for others  PARTICIPATION LIMITATIONS: meal prep, cleaning, laundry, shopping, community activity, and yard work  PERSONAL FACTORS: Age, Fitness, Past/current experiences, and 1-2 comorbidities: COPD, HFpEF, bradycardia, and memory loss are also affecting patient's functional outcome.   REHAB POTENTIAL: Good  CLINICAL DECISION MAKING: Evolving/moderate complexity  EVALUATION COMPLEXITY: Moderate  PLAN:  PT FREQUENCY: 2x/week  PT DURATION: 8 weeks  PLANNED INTERVENTIONS: [id number]- PT Re-evaluation, 97750- Physical Performance Testing, 97110-Therapeutic exercises, 97530- Therapeutic activity, [id number]- Neuromuscular re-education, 97535- Self Care, [id number]- Manual therapy, [id number]- Gait training, [phone]- Orthotic Initial, [phone]- Orthotic/Prosthetic subsequent, [phone]- Aquatic Therapy, [phone]- Electrical stimulation (manual), [id number]- Ultrasound, [id number]- Traction (mechanical), [phone] (1-2 muscles), [id number] (3+ muscles)- Dry Needling, Patient/Family education, Balance training, Stair training, Taping, Joint mobilization, Spinal mobilization, Compression bandaging, Cognitive remediation, DME instructions, Cryotherapy, Moist heat, and Biofeedback  PLAN FOR NEXT SESSION: Check on HEP;  balance, strengthening, aerobic endurance; coordination.   Damien Caulk, PT 11/09/2024, 7:09 PM

## 2024-11-09 NOTE — Therapy (Signed)
 OUTPATIENT SPEECH LANGUAGE PATHOLOGY VOICE TREATMENT   Patient Name: [name] MRN: [id number] DOB:12/20/1940, 83 y.o., female Today's Date: 11/09/2024  PCP: [name], MD REFERRING PROVIDER: Mila Adah JONELLE, DO  END OF SESSION:  End of Session - 11/09/24 1631     Visit Number 2    Number of Visits 11    Date for Recertification  01/09/25    SLP Start Time 1450    SLP Stop Time  1530    SLP Time Calculation (min) 40 min    Activity Tolerance Patient tolerated treatment well           Past Medical History:  Diagnosis Date   Allergic rhinitis    Arnold-Chiari malformation (HCC)    Asthma    CAD (coronary artery disease)    COPD (chronic obstructive pulmonary disease) (HCC)    Coughing    coughing since last 01-22-2019 , started on cefdinir bid x10 days, has 3 pills left today , reports she feels mcuh better , still coughing copiuus amonts of thick white sputum , denies fever nor chills, nor body aches .    DJD (degenerative joint disease)    GERD (gastroesophageal reflux disease)    Hiatal hernia    History of absence seizures    last confirmed seizure age 73     Hx of colonic polyps    Hypercholesteremia    Hypertension    Obesity    Positive PPD    Reflex sympathetic dystrophy    Sleep apnea    Varicose veins    Venous insufficiency    Past Surgical History:  Procedure Laterality Date   ABDOMINAL HYSTERECTOMY     CHOLECYSTECTOMY N/A 02/19/2023   Procedure: LAPAROSCOPIC CHOLECYSTECTOMY WITH INTRAOPERATIVE CHOLANGIOGRAM AND ICG DYE;  Surgeon: Tanda Locus, MD;  Location: [facility] OR;  Service: General;  Laterality: N/A;   cspine surgery  12/02/1991   for arnold-chiari malformation   IR BONE MARROW BIOPSY & ASPIRATION  01/28/2023   JOINT REPLACEMENT  06/05/2011   Left total knee replacement   right knee arthroscopy  12/02/2007   Dr. Duwayne   right total knee replacement  05/31/2008   Dr. Duwayne   TONSILLECTOMY     TOTAL KNEE REVISION Left 02/03/2019    Procedure: LEFT TOTAL KNEE REVISION;  Surgeon: Fidel Rogue, MD;  Location: [facility] ORS;  Service: Orthopedics;  Laterality: Left;   Patient Active Problem List   Diagnosis Date Noted   Seasonal and perennial allergic rhinitis 07/18/2024   History of smoking 25-50 pack years 07/18/2024   Painful swallowing 07/18/2024   Abnormal CT scan 12/19/2022   Pain of upper abdomen 12/19/2022   Dilation of biliary tract 12/16/2022   Cellulitis of right leg 06/18/2022   Hyperglycemia 06/18/2022   Laceration of right index finger 05/08/2022   Acquired hallux valgus of left foot 11/22/2021   Primary localized osteoarthrosis of ankle and foot 11/22/2021   AKI (acute kidney injury) 03/13/2021   Memory loss 03/13/2021   Anxiety 03/13/2021   Hypokalemia 03/05/2021   Generalized weakness 03/04/2021   Dizziness 03/04/2021   Vitamin D  deficiency 01/16/2021   Cough 12/13/2020   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   Allergic rhinitis due to animal (cat) (dog) hair and dander 12/04/2020   Allergic rhinitis due to pollen 12/04/2020   Moderate persistent asthma, uncomplicated 12/04/2020   Radiculopathy, lumbar region 11/19/2020   Spondylolisthesis, lumbar region 11/19/2020   Leg cramping 11/06/2020   Degeneration of lumbar intervertebral disc 09/27/2020   Degenerative  spondylolisthesis 08/15/2020   Pes anserinus bursitis of right knee 05/07/2020   Ulcer of toe, left, limited to breakdown of skin (HCC) 08/31/2019   Diastolic CHF, chronic (HCC) 08/31/2019   PVD (peripheral vascular disease) 08/31/2019   Bilateral carpal tunnel syndrome 08/19/2019   Posterior neck pain 08/19/2019   Vertigo 04/08/2019   Anemia 04/08/2019   Stiffness of left knee 02/10/2019   Failed total knee, left 02/03/2019   Failed total knee, left, initial encounter 02/03/2019   Carpal tunnel syndrome of left wrist 01/21/2019   Pain of left hand 01/21/2019   Preop exam for internal medicine 11/02/2018   Left hand paresthesia 11/02/2018    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 11/01/2018   Hyperopia with presbyopia, bilateral 06/29/2018   Abdominal pain, lower 03/02/2018   Diarrhea 03/02/2018   Encounter for well adult exam with abnormal findings 11/14/2017   Arnold-Chiari malformation (HCC)    Asthma, well controlled    CAD (coronary artery disease)    COPD (chronic obstructive pulmonary disease) (HCC)    DJD (degenerative joint disease)    History of absence seizures    Hx of colonic polyps    Hypercholesteremia    Positive PPD    Venous insufficiency    Pain in left lower leg 07/20/2017   Cellulitis of left lower extremity 06/18/2017   Bilateral lower extremity edema 04/30/2017   Shingles 02/26/2017   COPD exacerbation (HCC) 01/07/2017   S/P TKR (total knee replacement), bilateral 01/07/2017   Right arm pain 05/16/2015   Generalized anxiety disorder 08/31/2014   Varicose veins of bilateral lower extremities with other complications 01/06/2012   Edema 08/07/2011   MENORRHAGIA, POSTMENOPAUSAL 08/13/2010   CIGARETTE SMOKER 06/11/2010   CARDIAC MURMUR 11/02/2009   Bradycardia 11/01/2009   CHEST PAIN 11/01/2009   Allergic rhinitis 04/19/2009   Chronic obstructive airway disease with asthma (HCC) 04/18/2009   LEG CRAMPS, NOCTURNAL 11/02/2008   Essential hypertension 01/17/2008   COLONIC POLYPS 01/14/2008   Body mass index (BMI) 36.0-36.9, adult 01/14/2008   Reflex sympathetic dystrophy 01/14/2008   Coronary atherosclerosis 01/14/2008   LPRD (laryngopharyngeal reflux disease) 01/14/2008   Osteoarthritis 01/14/2008   BACK PAIN, LUMBAR 01/14/2008   SEIZURES, HX OF 01/14/2008    Onset date: Voice: ~3 months ago,                       Swallowing: Beginning of the year.  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 Pt accompanied by: self  PERTINENT HISTORY: (From 08/31/24 by Dr. Okey) [name] is an 83 year old female with a history of hiatal hernia on EGD 20 yrs ago, who presents with difficulty swallowing.   She has been experiencing difficulty swallowing for the past three months, describing it as a sensation of food not wanting to go down, particularly when taking medication. This occurs most of the time but not always. No recent illness preceded the onset of these symptoms.   A swallow study conducted in August was normal, but it only assessed the upper portion of her swallow. An MRI of the brain was  also normal, though there are some changes noted in the cervical spine.   She has a history of reflux and heartburn for which she is taking medication, and she reports that it helps. She has previously seen a GI doctor and underwent an upper endoscopy in 2005, which revealed a hiatal hernia.   She experiences hoarseness from time to time as well.  PAIN:  Are you having pain? Yes: Pain description: Back pain and arthritis pain.  FALLS: Has patient fallen in last 6 months? No, Number of falls: 0  LIVING ENVIRONMENT: Lives with: grandson Lives in: House/apartment  PLOF:Level of assistance: Independent with ADLs Employment: Retired  PATIENT GOALS: To improve voice and reduce perceived swallowing difficulties.  OBJECTIVE:  Note: Objective measures were completed at Evaluation unless otherwise noted.  DIAGNOSTIC FINDINGS: Flexible Fiberoptic Laryngoscopy Procedure Note (From 10/21/24) Pre-Op Diagnosis: hoarseness                                Post Op Diagnosis: same Procedure: Flexible Fiberoptic Laryngoscopy CPT 31575 - Mod 25 Surgeon: Adah Malkin, D.O. 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 It was passed along the floor of the nasal cavity to the nasopharynx. Torus tubarius was patent and the Fossa of Rosenmller was identified. The scope was then flexed caudally and advanced slowly through the nasopharynx, passed through the oropharynx, and down into the hypopharynx. The patient's oro- and nasopharynx were unremarkable with no signs of any gross lesions, edema, masses, or bleeding.  The base of tongue was visualized and no mass, ulceration or lesion was appreciated. The epiglottis did not demonstrate any mass, ulceration or lesion. The vallecula was also assessed with no mass, ulceration or lesion. The patient had good glottic closure upon phonation and no signs of aspiration or pooling of secretions. The patient was asked to inspire and expire with the true vocal folds vibrating normally and without evidence of vocal fold dysfunction. The right true vocal fold was noted to have a polypoid growth at the anterior 1/3 approaching the commissure. The true and false vocal cords, interarytenoid, AE folds, and arytenoids did not demonstrate any significant edema or erythema. The patient was then asked to valsalva, and the pyriform sinuses were assessed which were unremarkable. The airway was patent and there was no evidence of compromise. The scope was then slowly withdrawn from the patient. The patient tolerated the procedure well and there were no complications.  Disposition: Stable  COGNITION: Overall cognitive status: Within functional limits for tasks assessed   SOCIAL HISTORY: Occupation: Retired Water  intake: Five 12 oz/day. Caffeine/alcohol  intake: minimal and pt is currently drinking 1 soda/not consistently.  Daily voice use: moderate  PERCEPTUAL VOICE  ASSESSMENT: Voice quality: hoarse, breathy, rough, strained, and low vocal intensity Vocal abuse: habitual abnormal pitch Resonance: normal Respiratory function: thoracic breathing GRBAS: G-2, R-2, B-2, A-2, S-1  OBJECTIVE VOICE ASSESSMENT: Maximum phonation time for sustained ah: 2.8 seconds Conversational pitch average: 105 Hz Conversational pitch range:  90-130 Hz Conversational loudness average: 71 dB Conversational loudness range: 61-81 dB S/z ratio: 2.91 (Suggestive of dysfunction >1.0)  PATIENT REPORTED OUTCOME MEASURES (PROM): V-RQOL: 33/50                                                                                                                            TREATMENT DATE:   11/09/24: SLP had pt complete EAT-10 to assess self-perception of swallow ability. Pt notes that swallowing pills and solids is more difficult than liquids. SLP introduced strategies for vocal hygiene (increased water  intake, GERD lifestyle changes). Pt is agreeable to making adjustments to water  intake and utilizing lifestyle changes for GERD to optimize vocal health and wellness. SLP engaged pt in respiratory retraining, focusing on establishing diaphragmatic breath for balance phonation. After SLP guided demonstration, pt was 70% consistent with performing diaphragmatic breathing given frequent min A. SLP reintroduced Resonant Voice Therapy (RVT) for maximizing forward resonance during voice production. During /b/ words, pt was 75% consistent with achieving forward resonance during voice production given frequent min A. Pt is making progress with voice goals. Plan is to continue RVT and introduce vocal function exercises for reducing laryngeal hyperfunction.   10/31/24: Evaluation completed. SLP initiated tx on this date. SLP performed stimulability with pt to measure prognosis with voice therapy exercises. SLP introduced Resonant Voice Therapy (RVT) through /b/ syllables and words. Pt demonstrated ability  to achieve clear voicing using RVT. SLP educated pt about basic physiology of the voice (respiration, phonation, articulation/resonance) for optimizing pt understanding of voice exercises. Pt demonstrated emerging understanding of physiology of voice; will need reinforcement in future sessions. Plan is to continue RVT and introduced vocal function exercises (VFEs) for maximizing clear voicing for QOL.    PATIENT EDUCATION: Education details: Physiology of voice Person educated: Patient Education method: Explanation Education comprehension: verbalized understanding  HOME EXERCISE PROGRAM: TBD in upcoming sessions.  GOALS: Goals reviewed with patient? Yes  SHORT TERM GOALS: Target date: 12/03/24  Pt will utilize diaphragmatic breathing during structured exercises and conversation in 80% of opportunities given occasional min A. Baseline: Pt uses thoracic breathing pattern. Goal status: INITIAL  2.  Pt will achieve clear voicing during structured exercises and conversation in 80% of opportunities across 2 sessions given occasional min A. Baseline:  Goal status: INITIAL  3.  Pt will describe the basic physiology of the voice (Breathing, Phonation, Artic/Resonance) as related to voice therapy exercises with 90% consistency given rare min A.  Baseline:  Goal status: INITIAL  4.  Pt will describe at least 4 swallow precautions/exercises to given min A.  Baseline:  Goal status: INITIAL  5.  Pt will perform structured, sustained phonation exercises with clear voicing in 80% of opportunities when given occasional min A.  Baseline:  Goal status: INITIAL  6.  Pt will complete PROM for swallowing/reflux. Baseline:  Goal status: MET  LONG TERM GOALS: Target date: 01/09/25  Pt will achieve clear voicing during unstructured conversation in 85% of opportunities given rare min A.  Baseline: Pt voice is dysphonic.  Goal status: INITIAL  2.  Pt will report decreased score on PROM (V-RQOL),  displaying self-perceived improvement of voice. Baseline: 33/50 Goal status: INITIAL  3.  Pt will describe and utilize swallow precautions to minimize self-reported swallowing difficulties.  Baseline:  Goal status: INITIAL   ASSESSMENT:  CLINICAL IMPRESSION: Patient is a 83 y.o. female who was seen today for a behavioral voice evaluation. Pt presents with a moderate dysphonia characterized by rough, breathy, and hoarse vocal quality and 33/50 on V-RQOL scale. According to PROM, pt frequently experiences depressive thoughts and avoids socializing in public due to dysphonia. Pt noted that her dysphonia has affected her ability to sing. Pt was a regular singer up until 3 months ago when her dysphonia started. In addition, pt reported trouble swallowing, specifically with pills. Pt had MBSS recently (07/08/24), with no significant findings. Pt does note hx of reflux and that she currently takes medication for reflux.   OBJECTIVE IMPAIRMENTS: include voice disorder. These impairments are limiting patient from effectively communicating at home and in community. Factors affecting potential to achieve goals and functional outcome are N/A. Patient will benefit from skilled SLP services to address above impairments and improve overall function.  REHAB POTENTIAL: Good  PLAN:  SLP FREQUENCY: 1x/week  SLP DURATION: 10 weeks  PLANNED INTERVENTIONS: Aspiration precaution training, SLP instruction and feedback, Compensatory strategies, and Patient/family education    [employer], CF-SLP 11/09/2024, 4:32 PM

## 2024-11-16 ENCOUNTER — Ambulatory Visit: Admitting: Physical Therapy

## 2024-11-16 ENCOUNTER — Ambulatory Visit

## 2024-11-16 ENCOUNTER — Encounter: Payer: Self-pay | Admitting: Physical Therapy

## 2024-11-16 VITALS — BP 145/73 | HR 52

## 2024-11-16 DIAGNOSIS — R490 Dysphonia: Secondary | ICD-10-CM | POA: Diagnosis not present

## 2024-11-16 DIAGNOSIS — M6281 Muscle weakness (generalized): Secondary | ICD-10-CM

## 2024-11-16 DIAGNOSIS — R2689 Other abnormalities of gait and mobility: Secondary | ICD-10-CM

## 2024-11-16 NOTE — Therapy (Signed)
 OUTPATIENT SPEECH LANGUAGE PATHOLOGY VOICE TREATMENT   Patient Name: [name] MRN: [id number] DOB:Mar 25, 1941, 83 y.o., female Today's Date: 11/16/2024  PCP: [name], MD REFERRING PROVIDER: Mila Adah JONELLE, DO  END OF SESSION:  End of Session - 11/16/24 1318     Visit Number 3    Number of Visits 11    Date for Recertification  01/09/25    SLP Start Time 1232    SLP Stop Time  1316    SLP Time Calculation (min) 44 min    Activity Tolerance Patient tolerated treatment well            Past Medical History:  Diagnosis Date   Allergic rhinitis    Arnold-Chiari malformation (HCC)    Asthma    CAD (coronary artery disease)    COPD (chronic obstructive pulmonary disease) (HCC)    Coughing    coughing since last 01-22-2019 , started on cefdinir bid x10 days, has 3 pills left today , reports she feels mcuh better , still coughing copiuus amonts of thick white sputum , denies fever nor chills, nor body aches .    DJD (degenerative joint disease)    GERD (gastroesophageal reflux disease)    Hiatal hernia    History of absence seizures    last confirmed seizure age 12     Hx of colonic polyps    Hypercholesteremia    Hypertension    Obesity    Positive PPD    Reflex sympathetic dystrophy    Sleep apnea    Varicose veins    Venous insufficiency    Past Surgical History:  Procedure Laterality Date   ABDOMINAL HYSTERECTOMY     CHOLECYSTECTOMY N/A 02/19/2023   Procedure: LAPAROSCOPIC CHOLECYSTECTOMY WITH INTRAOPERATIVE CHOLANGIOGRAM AND ICG DYE;  Surgeon: Tanda Locus, MD;  Location: [facility] OR;  Service: General;  Laterality: N/A;   cspine surgery  12/02/1991   for arnold-chiari malformation   IR BONE MARROW BIOPSY & ASPIRATION  01/28/2023   JOINT REPLACEMENT  06/05/2011   Left total knee replacement   right knee arthroscopy  12/02/2007   Dr. Duwayne   right total knee replacement  05/31/2008   Dr. Duwayne   TONSILLECTOMY     TOTAL KNEE REVISION Left 02/03/2019    Procedure: LEFT TOTAL KNEE REVISION;  Surgeon: Fidel Rogue, MD;  Location: [facility] ORS;  Service: Orthopedics;  Laterality: Left;   Patient Active Problem List   Diagnosis Date Noted   Seasonal and perennial allergic rhinitis 07/18/2024   History of smoking 25-50 pack years 07/18/2024   Painful swallowing 07/18/2024   Abnormal CT scan 12/19/2022   Pain of upper abdomen 12/19/2022   Dilation of biliary tract 12/16/2022   Cellulitis of right leg 06/18/2022   Hyperglycemia 06/18/2022   Laceration of right index finger 05/08/2022   Acquired hallux valgus of left foot 11/22/2021   Primary localized osteoarthrosis of ankle and foot 11/22/2021   AKI (acute kidney injury) 03/13/2021   Memory loss 03/13/2021   Anxiety 03/13/2021   Hypokalemia 03/05/2021   Generalized weakness 03/04/2021   Dizziness 03/04/2021   Vitamin D  deficiency 01/16/2021   Cough 12/13/2020   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   Allergic rhinitis due to animal (cat) (dog) hair and dander 12/04/2020   Allergic rhinitis due to pollen 12/04/2020   Moderate persistent asthma, uncomplicated 12/04/2020   Radiculopathy, lumbar region 11/19/2020   Spondylolisthesis, lumbar region 11/19/2020   Leg cramping 11/06/2020   Degeneration of lumbar intervertebral disc 09/27/2020  Degenerative spondylolisthesis 08/15/2020   Pes anserinus bursitis of right knee 05/07/2020   Ulcer of toe, left, limited to breakdown of skin (HCC) 08/31/2019   Diastolic CHF, chronic (HCC) 08/31/2019   PVD (peripheral vascular disease) 08/31/2019   Bilateral carpal tunnel syndrome 08/19/2019   Posterior neck pain 08/19/2019   Vertigo 04/08/2019   Anemia 04/08/2019   Stiffness of left knee 02/10/2019   Failed total knee, left 02/03/2019   Failed total knee, left, initial encounter 02/03/2019   Carpal tunnel syndrome of left wrist 01/21/2019   Pain of left hand 01/21/2019   Preop exam for internal medicine 11/02/2018   Left hand paresthesia 11/02/2018    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 11/01/2018   Hyperopia with presbyopia, bilateral 06/29/2018   Abdominal pain, lower 03/02/2018   Diarrhea 03/02/2018   Encounter for well adult exam with abnormal findings 11/14/2017   Arnold-Chiari malformation (HCC)    Asthma, well controlled    CAD (coronary artery disease)    COPD (chronic obstructive pulmonary disease) (HCC)    DJD (degenerative joint disease)    History of absence seizures    Hx of colonic polyps    Hypercholesteremia    Positive PPD    Venous insufficiency    Pain in left lower leg 07/20/2017   Cellulitis of left lower extremity 06/18/2017   Bilateral lower extremity edema 04/30/2017   Shingles 02/26/2017   COPD exacerbation (HCC) 01/07/2017   S/P TKR (total knee replacement), bilateral 01/07/2017   Right arm pain 05/16/2015   Generalized anxiety disorder 08/31/2014   Varicose veins of bilateral lower extremities with other complications 01/06/2012   Edema 08/07/2011   MENORRHAGIA, POSTMENOPAUSAL 08/13/2010   CIGARETTE SMOKER 06/11/2010   CARDIAC MURMUR 11/02/2009   Bradycardia 11/01/2009   CHEST PAIN 11/01/2009   Allergic rhinitis 04/19/2009   Chronic obstructive airway disease with asthma (HCC) 04/18/2009   LEG CRAMPS, NOCTURNAL 11/02/2008   Essential hypertension 01/17/2008   COLONIC POLYPS 01/14/2008   Body mass index (BMI) 36.0-36.9, adult 01/14/2008   Reflex sympathetic dystrophy 01/14/2008   Coronary atherosclerosis 01/14/2008   LPRD (laryngopharyngeal reflux disease) 01/14/2008   Osteoarthritis 01/14/2008   BACK PAIN, LUMBAR 01/14/2008   SEIZURES, HX OF 01/14/2008    Onset date: Voice: ~3 months ago,                       Swallowing: Beginning of the year.  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 Pt accompanied by: self  PERTINENT HISTORY: (From 08/31/24 by Dr. Okey) [name] is an 83 year old female with a history of hiatal hernia on EGD 20 yrs ago, who presents with difficulty swallowing.   She has been experiencing difficulty swallowing for the past three months, describing it as a sensation of food not wanting to go down, particularly when taking medication. This occurs most of the time but not always. No recent illness preceded the onset of these symptoms.   A swallow study conducted in August was normal, but it only assessed the upper portion of her swallow. An MRI of the brain  was also normal, though there are some changes noted in the cervical spine.   She has a history of reflux and heartburn for which she is taking medication, and she reports that it helps. She has previously seen a GI doctor and underwent an upper endoscopy in 2005, which revealed a hiatal hernia.   She experiences hoarseness from time to time as well.  PAIN:  Are you having pain? Yes: Pain description: Back pain and arthritis pain.  FALLS: Has patient fallen in last 6 months? No, Number of falls: 0  LIVING ENVIRONMENT: Lives with: grandson Lives in: House/apartment  PLOF:Level of assistance: Independent with ADLs Employment: Retired  PATIENT GOALS: To improve voice and reduce perceived swallowing difficulties.  OBJECTIVE:  Note: Objective measures were completed at Evaluation unless otherwise noted.  DIAGNOSTIC FINDINGS: Flexible Fiberoptic Laryngoscopy Procedure Note (From 10/21/24) Pre-Op Diagnosis: hoarseness                                Post Op Diagnosis: same Procedure: Flexible Fiberoptic Laryngoscopy CPT 31575 - Mod 25 Surgeon: Adah Malkin, D.O. 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 It was passed along the floor of the nasal cavity to the nasopharynx. Torus tubarius was patent and the Fossa of Rosenmller was identified. The scope was then flexed caudally and advanced slowly through the nasopharynx, passed through the oropharynx, and down into the hypopharynx. The patient's oro- and nasopharynx were unremarkable with no signs of any gross lesions, edema, masses, or bleeding.  The base of tongue was visualized and no mass, ulceration or lesion was appreciated. The epiglottis did not demonstrate any mass, ulceration or lesion. The vallecula was also assessed with no mass, ulceration or lesion. The patient had good glottic closure upon phonation and no signs of aspiration or pooling of secretions. The patient was asked to inspire and expire with the true vocal folds vibrating normally and without evidence of vocal fold dysfunction. The right true vocal fold was noted to have a polypoid growth at the anterior 1/3 approaching the commissure. The true and false vocal cords, interarytenoid, AE folds, and arytenoids did not demonstrate any significant edema or erythema. The patient was then asked to valsalva, and the pyriform sinuses were assessed which were unremarkable. The airway was patent and there was no evidence of compromise. The scope was then slowly withdrawn from the patient. The patient tolerated the procedure well and there were no complications.  Disposition: Stable  COGNITION: Overall cognitive status: Within functional limits for tasks assessed   SOCIAL HISTORY: Occupation: Retired Water  intake: Five 12 oz/day. Caffeine/alcohol  intake: minimal and pt is currently drinking 1 soda/not consistently.  Daily voice use: moderate  PERCEPTUAL VOICE  ASSESSMENT: Voice quality: hoarse, breathy, rough, strained, and low vocal intensity Vocal abuse: habitual abnormal pitch Resonance: normal Respiratory function: thoracic breathing GRBAS: G-2, R-2, B-2, A-2, S-1  OBJECTIVE VOICE ASSESSMENT: Maximum phonation time for sustained ah: 2.8 seconds Conversational pitch average: 105 Hz Conversational pitch range:  90-130 Hz Conversational loudness average: 71 dB Conversational loudness range: 61-81 dB S/z ratio: 2.91 (Suggestive of dysfunction >  1.0)  PATIENT REPORTED OUTCOME MEASURES (PROM): V-RQOL: 33/50                                                                                                                            TREATMENT DATE:   11/16/24: Pt has been practicing her HEP every day since previous session. Pt is trying to drink 5-6 water  bottles, which is an improvement from prior session. Pt was 65% consistent with performing diaphragmatic breathing given frequent min A. SLP reviewed Resonant Voice Therapy (RVT) for maximizing forward resonance during voice production. During /b/ words, pt was 70% consistent with achieving forward resonance during voice production given frequent min A. SLP challenged pt to utilize forward resonance during /m/ words. Pt achieved clear voice in 65% of opportunities during /m/ word productions given frequent mod A. Suspect that pt's breath management is a major factor for reduced performance. SLP introduced Respiratory Muscle Strength Training (RMST) as a tool for improving expiratory muscle strength for voice production. SLP began guiding pt through using RMST device; pt displayed emerging understanding of device usage. Pt will need reinforcement in upcoming session for improving understanding and performance with device. Pt is making progress with voice goals. Plan is to continue RVT and RMST for optimizing voice production for QOL.   11/09/24: SLP had pt complete EAT-10 to assess self-perception of  swallow ability. Pt notes that swallowing pills and solids is more difficult than liquids. SLP introduced strategies for vocal hygiene (increased water  intake, GERD lifestyle changes). Pt is agreeable to making adjustments to water  intake and utilizing lifestyle changes for GERD to optimize vocal health and wellness. SLP engaged pt in respiratory retraining, focusing on establishing diaphragmatic breath for balance phonation. After SLP guided demonstration, pt was 70% consistent with performing diaphragmatic breathing given frequent min A. SLP reintroduced Resonant Voice Therapy (RVT) for maximizing forward resonance during voice production. During /b/ words, pt was 75% consistent with achieving forward resonance during voice production given frequent min A. Pt is making progress with voice goals. Plan is to continue RVT and introduce vocal function exercises for reducing laryngeal hyperfunction.   10/31/24: Evaluation completed. SLP initiated tx on this date. SLP performed stimulability with pt to measure prognosis with voice therapy exercises. SLP introduced Resonant Voice Therapy (RVT) through /b/ syllables and words. Pt demonstrated ability to achieve clear voicing using RVT. SLP educated pt about basic physiology of the voice (respiration, phonation, articulation/resonance) for optimizing pt understanding of voice exercises. Pt demonstrated emerging understanding of physiology of voice; will need reinforcement in future sessions. Plan is to continue RVT and introduced vocal function exercises (VFEs) for maximizing clear voicing for QOL.    PATIENT EDUCATION: Education details: Physiology of voice Person educated: Patient Education method: Explanation Education comprehension: verbalized understanding  HOME EXERCISE PROGRAM: TBD in upcoming sessions.  GOALS: Goals reviewed with patient? Yes  SHORT TERM GOALS: Target date: 12/03/24  Pt will utilize diaphragmatic breathing during structured  exercises and conversation  in 80% of opportunities given occasional min A. Baseline: Pt uses thoracic breathing pattern. Goal status: INITIAL  2.  Pt will achieve clear voicing during structured exercises and conversation in 80% of opportunities across 2 sessions given occasional min A. Baseline:  Goal status: INITIAL  3.  Pt will describe the basic physiology of the voice (Breathing, Phonation, Artic/Resonance) as related to voice therapy exercises with 90% consistency given rare min A.  Baseline:  Goal status: INITIAL  4.  Pt will describe at least 4 swallow precautions/exercises to given min A.  Baseline:  Goal status: INITIAL  5.  Pt will perform structured, sustained phonation exercises with clear voicing in 80% of opportunities when given occasional min A.  Baseline:  Goal status: INITIAL  6.  Pt will complete PROM for swallowing/reflux. Baseline:  Goal status: MET  LONG TERM GOALS: Target date: 01/09/25  Pt will achieve clear voicing during unstructured conversation in 85% of opportunities given rare min A.  Baseline: Pt voice is dysphonic.  Goal status: INITIAL  2.  Pt will report decreased score on PROM (V-RQOL), displaying self-perceived improvement of voice. Baseline: 33/50 Goal status: INITIAL  3.  Pt will describe and utilize swallow precautions to minimize self-reported swallowing difficulties.  Baseline:  Goal status: INITIAL   ASSESSMENT:  CLINICAL IMPRESSION: Patient is a 83 y.o. female who was seen today for a behavioral voice evaluation. Pt presents with a moderate dysphonia characterized by rough, breathy, and hoarse vocal quality and 33/50 on V-RQOL scale. According to PROM, pt frequently experiences depressive thoughts and avoids socializing in public due to dysphonia. Pt noted that her dysphonia has affected her ability to sing. Pt was a regular singer up until 3 months ago when her dysphonia started. In addition, pt reported trouble swallowing,  specifically with pills. Pt had MBSS recently (07/08/24), with no significant findings. Pt does note hx of reflux and that she currently takes medication for reflux.   OBJECTIVE IMPAIRMENTS: include voice disorder. These impairments are limiting patient from effectively communicating at home and in community. Factors affecting potential to achieve goals and functional outcome are N/A. Patient will benefit from skilled SLP services to address above impairments and improve overall function.  REHAB POTENTIAL: Good  PLAN:  SLP FREQUENCY: 1x/week  SLP DURATION: 10 weeks  PLANNED INTERVENTIONS: Aspiration precaution training, SLP instruction and feedback, Compensatory strategies, and Patient/family education    [employer], CF-SLP 11/16/2024, 4:38 PM

## 2024-11-16 NOTE — Therapy (Signed)
 OUTPATIENT PHYSICAL THERAPY NEURO TREATMENT   Patient Name: [name] MRN: [id number] DOB:09/17/1941, 83 y.o., female Today's Date: 11/17/2024  PCP: [name], MD REFERRING PROVIDER: Gregg Lek, MD  END OF SESSION:  PT End of Session - 11/16/24 1320     Visit Number 4    Number of Visits 17   16 plus Eval   Date for Recertification  01/09/25   for scheduling delays   Authorization Type [employer]    PT Start Time [phone]   Received after SLP therapy   PT Stop Time 1355   pt needing to use restroom   PT Time Calculation (min) 38 min    Equipment Utilized During Treatment Gait belt    Activity Tolerance Patient tolerated treatment well   with pacing/rest breaks   Behavior During Therapy WFL for tasks assessed/performed          Past Medical History:  Diagnosis Date   Allergic rhinitis    Arnold-Chiari malformation (HCC)    Asthma    CAD (coronary artery disease)    COPD (chronic obstructive pulmonary disease) (HCC)    Coughing    coughing since last 01-22-2019 , started on cefdinir bid x10 days, has 3 pills left today , reports she feels mcuh better , still coughing copiuus amonts of thick white sputum , denies fever nor chills, nor body aches .    DJD (degenerative joint disease)    GERD (gastroesophageal reflux disease)    Hiatal hernia    History of absence seizures    last confirmed seizure age 51     Hx of colonic polyps    Hypercholesteremia    Hypertension    Obesity    Positive PPD    Reflex sympathetic dystrophy    Sleep apnea    Varicose veins    Venous insufficiency    Past Surgical History:  Procedure Laterality Date   ABDOMINAL HYSTERECTOMY     CHOLECYSTECTOMY N/A 02/19/2023   Procedure: LAPAROSCOPIC CHOLECYSTECTOMY WITH INTRAOPERATIVE CHOLANGIOGRAM AND ICG DYE;  Surgeon: Tanda Locus, MD;  Location: [facility] OR;  Service: General;  Laterality: N/A;   cspine surgery  12/02/1991   for arnold-chiari malformation   IR BONE MARROW BIOPSY &  ASPIRATION  01/28/2023   JOINT REPLACEMENT  06/05/2011   Left total knee replacement   right knee arthroscopy  12/02/2007   Dr. Duwayne   right total knee replacement  05/31/2008   Dr. Duwayne   TONSILLECTOMY     TOTAL KNEE REVISION Left 02/03/2019   Procedure: LEFT TOTAL KNEE REVISION;  Surgeon: Fidel Rogue, MD;  Location: [facility] ORS;  Service: Orthopedics;  Laterality: Left;   Patient Active Problem List   Diagnosis Date Noted   Seasonal and perennial allergic rhinitis 07/18/2024   History of smoking 25-50 pack years 07/18/2024   Painful swallowing 07/18/2024   Abnormal CT scan 12/19/2022   Pain of upper abdomen 12/19/2022   Dilation of biliary tract 12/16/2022   Cellulitis of right leg 06/18/2022   Hyperglycemia 06/18/2022   Laceration of right index finger 05/08/2022   Acquired hallux valgus of left foot 11/22/2021   Primary localized osteoarthrosis of ankle and foot 11/22/2021   AKI (acute kidney injury) 03/13/2021   Memory loss 03/13/2021   Anxiety 03/13/2021   Hypokalemia 03/05/2021   Generalized weakness 03/04/2021   Dizziness 03/04/2021   Vitamin D  deficiency 01/16/2021   Cough 12/13/2020   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   Allergic rhinitis due to animal (cat) (dog) hair  and dander 12/04/2020   Allergic rhinitis due to pollen 12/04/2020   Moderate persistent asthma, uncomplicated 12/04/2020   Radiculopathy, lumbar region 11/19/2020   Spondylolisthesis, lumbar region 11/19/2020   Leg cramping 11/06/2020   Degeneration of lumbar intervertebral disc 09/27/2020   Degenerative spondylolisthesis 08/15/2020   Pes anserinus bursitis of right knee 05/07/2020   Ulcer of toe, left, limited to breakdown of skin (HCC) 08/31/2019   Diastolic CHF, chronic (HCC) 08/31/2019   PVD (peripheral vascular disease) 08/31/2019   Bilateral carpal tunnel syndrome 08/19/2019   Posterior neck pain 08/19/2019   Vertigo 04/08/2019   Anemia 04/08/2019   Stiffness of left knee 02/10/2019   Failed total  knee, left 02/03/2019   Failed total knee, left, initial encounter 02/03/2019   Carpal tunnel syndrome of left wrist 01/21/2019   Pain of left hand 01/21/2019   Preop exam for internal medicine 11/02/2018   Left hand paresthesia 11/02/2018   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 11/01/2018   Hyperopia with presbyopia, bilateral 06/29/2018   Abdominal pain, lower 03/02/2018   Diarrhea 03/02/2018   Encounter for well adult exam with abnormal findings 11/14/2017   Arnold-Chiari malformation (HCC)    Asthma, well controlled    CAD (coronary artery disease)    COPD (chronic obstructive pulmonary disease) (HCC)    DJD (degenerative joint disease)    History of absence seizures    Hx of colonic polyps    Hypercholesteremia    Positive PPD    Venous insufficiency    Pain in left lower leg 07/20/2017   Cellulitis of left lower extremity 06/18/2017   Bilateral lower extremity edema 04/30/2017   Shingles 02/26/2017   COPD exacerbation (HCC) 01/07/2017   S/P TKR (total knee replacement), bilateral 01/07/2017   Right arm pain 05/16/2015   Generalized anxiety disorder 08/31/2014   Varicose veins of bilateral lower extremities with other complications 01/06/2012   Edema 08/07/2011   MENORRHAGIA, POSTMENOPAUSAL 08/13/2010   CIGARETTE SMOKER 06/11/2010   CARDIAC MURMUR 11/02/2009   Bradycardia 11/01/2009   CHEST PAIN 11/01/2009   Allergic rhinitis 04/19/2009   Chronic obstructive airway disease with asthma (HCC) 04/18/2009   LEG CRAMPS, NOCTURNAL 11/02/2008   Essential hypertension 01/17/2008   COLONIC POLYPS 01/14/2008   Body mass index (BMI) 36.0-36.9, adult 01/14/2008   Reflex sympathetic dystrophy 01/14/2008   Coronary atherosclerosis 01/14/2008   LPRD (laryngopharyngeal reflux disease) 01/14/2008   Osteoarthritis 01/14/2008   BACK PAIN, LUMBAR 01/14/2008   SEIZURES, HX OF 01/14/2008    ONSET DATE: 09/30/2024 (date of referral)  REFERRING DIAG: R53.1 (ICD-10-CM) -  Weakness M54.2 (ICD-10-CM) - Cervicalgia  THERAPY DIAG:  Other abnormalities of gait and mobility  Muscle weakness (generalized)  Rationale for Evaluation and Treatment: Rehabilitation  SUBJECTIVE:  Likes to be called [name]  SUBJECTIVE STATEMENT: Planning for steroid injection for low back tomorrow morning.  L cheek pain better but still there.  No recent falls.  Doing HEP (no concerns noted). Pt accompanied by: self  PERTINENT HISTORY: Pt with tremors and weakness (noted back in June; went to ED; MRI brain did not show acute abnormalities).  Difficulty eating and holding objects; difficulty writing; tremors worsen during the day.  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  Per PT referral: Cervicalgia. Left side weakness. 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  PAIN:  Are you having pain? Yes: NPRS scale: 7/10 Pain location: back, stomach, and legs Pain description: sharp shooting; aching Aggravating factors: standing a lot Relieving factors: pain patches; medication cream  PRECAUTIONS: Fall; Swallowing difficulties (with pills and certain foods; not liquids/water )  RED FLAGS: None   WEIGHT BEARING RESTRICTIONS: No  FALLS: Has patient fallen in last 6 months? No  LIVING ENVIRONMENT: Lives with: lives with their family (grandson) Lives in: House/apartment Stairs: Yes: External: 5 steps; on right going up, on left going up, and can reach both Has following equipment at home: Single point cane and Walker - 2 wheeled  PLOF: Ambulates with  RW (last 6 months or more).  Participated in OP PT first part of this year.  Was doing water  aerobics at [facility] (hasn't done in last 5 months d/t stomach acting up).  PATIENT GOALS: Try to get strength back and do some of the things she was able to do before.  OBJECTIVE:  Note: Objective measures were completed at Evaluation unless otherwise noted.  DIAGNOSTIC FINDINGS:  MRI C-spine 09/26/24: - Multilevel spondylosis, disc bulging, reversal of normal cervical curvature and grade 2 anterior spondylolisthesis of C2 and C3 measuring 7 mm. - At C6-7 disc bulging and facet hypertrophy with moderate to severe spinal stenosis and severe bilateral foraminal stenosis; no cord signal abnormalities. - At C2-3 pseudo disc bulging and facet hypertrophy with moderate spinal stenosis and severe bilateral foraminal stenosis; no cord signal abnormalities. - At C3-4 disc bulging with mild spinal stenosis and severe bilateral foraminal stenosis; no cord signal abnormalities.  COGNITION: Overall cognitive status: Within functional limits for tasks assessed   SENSATION: Decreased light touch sensation L lateral thigh, medial lower leg, and entire foot  COORDINATION: Decreased alternating toe taps L LE compared to R LE  EDEMA: (chronic L>R LE per pt report) Circumferential: malleolar line 27.5 cm R ankle; 28 cm L ankle  POSTURE: rounded shoulders and forward head  LOWER EXTREMITY MMT:    MMT Right Eval Left Eval  Hip flexion 4+/5 4+/5  Hip extension    Hip abduction    Hip adduction    Hip internal rotation    Hip external rotation    Knee flexion    Knee extension 4+/5 4+/5  Ankle dorsiflexion 4+/5 2/5  Ankle plantarflexion At least 3/5 AROM At least 2/5 AROM  Ankle inversion    Ankle eversion    (Blank rows = not tested)  TRANSFERS: Sit to stand: Modified independence  Assistive device utilized: Environmental Consultant - 2 wheeled     Stand to sit: Modified independence  Assistive device utilized: Environmental Consultant -  2 wheeled      GAIT: Findings: Gait Characteristics: narrow BOS; R LE ER>L  LE; foot flat R LE; decreased L LE DF with forefoot initial WB'ing during initial contact L LE; increased L trunk lean; short steps, Distance walked: clinic distances, Assistive device utilized:Walker - 2 wheeled, Level of assistance: Modified independence, and Comments: decreased gait speed  FUNCTIONAL TESTS:  5 times sit to stand: 49.78 seconds with B UE support (up to RW) 10 meter walk test: 0.31 m/sec (31.63 seconds with RW use) BERG Balance Test: 22/56 Item T/est date: 11/07/24 Date:  Date:   Sitting to standing 3. able to stand independently using hands Insert SmartPhrase OPRCBERGREEVAL Insert SmartPhrase OPRCBERGREEVAL  2. Standing unsupported 3. able to stand 2 minutes with supervision    3. Sitting with back unsupported, feet supported 4. able to sit safely and securely for 2 minutes    4. Standing to sitting 3. controls descent by using hands    5. Pivot transfer  3. able to transfer safely with definite need of hands    6. Standing unsupported with eyes closed 3. able to stand 10 seconds with supervision    7. Standing unsupported with feet together 1. needs help to attain position but able to stand 15 seconds feet together    8. Reaching forward with outstretched arms while standing 1. reaches forward but needs supervision    9. Pick up object from the floor from standing 0. unable to try/needs assistance to keep from losing balance or falling; needs assist for balance    10. Turning to look behind over left and right shoulders while standing 1. needs supervision when turning    11. Turn 360 degrees 0. needs assistance while turning    12. Place alternate foot on step or stool while standing unsupported 0. needs assistance to keep from falling/unable to try; assist to keep balance with 2 attempts    13. Standing unsupported one foot in front 0. loses balance while stepping or standing    14. Standing on one  leg 0. unable to try of needs assist to prevent fall ; need assist to maintain balance     Total Score 22/56 Total Score:    Total Score:      PATIENT SURVEYS:  TBA                                                                                                                             TREATMENT DATE: 11/16/24  Self Care: BP and HR taken in sitting at rest beginning of session (see below for details). Vitals:   11/16/24 1324  BP: (!) 145/73  Pulse: (!) 52  Pt education (see pt education below for details).  Therapeutic Activity: Standing activity in // bars: Toe taps to gumdrop: 3x10 reps B LE's with B UE support  Notes: pt requiring B UE support for balance; pt requiring sitting rest break after activity d/t fatigue. Step ups on 4 inch step (B UE support): X10 reps B LE's (keeping foot on step) Notes: vc's  for eccentric control initially; pt requiring sitting rest break after activity d/t fatigue Standing balance on wobble board (lateral): X1 minute x2 sets Notes: mostly single UE support to occasional B UE support; CGA for safety  PATIENT EDUCATION: Education details: Continue HEP.  Concentric/eccentric control with activities.  Call PCP to discuss continued L cheek pain if doesn't improve. Person educated: Patient Education method: Explanation, Demonstration, Verbal cues, and Handouts Education comprehension: verbalized understanding and needs further education  HOME EXERCISE PROGRAM: Access Code: [email or web address] URL: [email or web address] Date: 11/07/2024 Prepared by: Damien Caulk  Exercises - Seated March  - 1 x daily - 5 x weekly - 1-2 sets - 10 reps - Seated Long Arc Quad  - 1 x daily - 5 x weekly - 1-2 sets - 10 reps - Seated Hip Abduction with Resistance  - 1 x daily - 5 x weekly - 1-2 sets - 10 reps - Seated Heel Raise  - 1 x daily - 5 x weekly - 1-2 sets - 10 reps - Seated Toe Raise  - 1 x daily - 5 x weekly - 1-2 sets - 10 reps  GOALS: Goals  reviewed with patient? Yes  SHORT TERM GOALS: Target date: 11/28/2024  Pt will be independent with initial HEP in order to improve strength and balance in order to decrease fall risk and improve function at home for ADL's. Baseline: TBA Goal status: INITIAL  2.  Assess BERG Balance Test and update LTG as needed. Baseline: 22/56 11/07/24 Goal status: MET  3.  Pt will decrease 5 Time Sit to Stand by at least 10 seconds in order to demonstrate clinically significant improvement in LE strength. Baseline: 49.78 seconds (Eval) Goal status: INITIAL   LONG TERM GOALS: Target date: 12/26/2024  Pt will be independent with HEP in order to improve strength and balance in order to decrease fall risk and improve function at home for ADL's. Baseline: TBA Goal status: INITIAL  2.  Patient will demonstrate an improved Berg Balance Score of 40 or greater as to demonstrate improved balance with ADLs and reduced fall risk. Baseline: 22/56 11/07/24 Goal status: REVISED  3.  Pt will decrease 5 Time Sit to Stand by at least 20 seconds in order to demonstrate clinically significant improvement in LE strength. Baseline: 49.78 seconds (Eval) Goal status: INITIAL  4.  Pt will increase by at least 0.13 m/s in order to demonstrate clinically significant improvement in community ambulation.  Baseline: 0.31 m/sec Goal status: INITIAL  5.  Pt will demonstrate improved L LE foot clearance for safety with gait. Baseline: Decreased L foot clearance with RW use Goal status: INITIAL  ASSESSMENT:  CLINICAL IMPRESSION: Patient was seen today for physical therapy treatment to address balance.  Focused session on balance on (lateral) wobble board, increasing LE stability for balance with step ups, and dynamic single leg balance with toe taps to gum drops.  Patient continues to be limited by strength and balance; pt requiring sitting rest breaks between activities d/t fatigue.  They demonstrate improvement in  movement quality during step up activity after cueing.  They would continue to benefit from skilled PT to address impairments as noted and progress towards long term goals.  OBJECTIVE IMPAIRMENTS: Abnormal gait, cardiopulmonary status limiting activity, decreased activity tolerance, decreased balance, decreased coordination, decreased endurance, decreased knowledge of condition, decreased knowledge of use of DME, decreased mobility, difficulty walking, decreased ROM, decreased strength, increased edema, impaired flexibility, impaired sensation, improper body mechanics, postural dysfunction, and pain.  ACTIVITY LIMITATIONS: carrying, lifting, bending, standing, squatting, stairs, transfers, bed mobility, continence, toileting, dressing, locomotion level, and caring for others  PARTICIPATION LIMITATIONS: meal prep, cleaning, laundry, shopping, community activity, and yard work  PERSONAL FACTORS: Age, Fitness, Past/current experiences, and 1-2 comorbidities: COPD, HFpEF, bradycardia, and memory loss are also affecting patient's functional outcome.   REHAB POTENTIAL: Good  CLINICAL DECISION MAKING: Evolving/moderate complexity  EVALUATION COMPLEXITY: Moderate  PLAN:  PT FREQUENCY: 2x/week  PT DURATION: 8 weeks  PLANNED INTERVENTIONS: [id number]- PT Re-evaluation, 97750- Physical Performance Testing, 97110-Therapeutic exercises, 97530- Therapeutic activity, [id number]- Neuromuscular re-education, 97535- Self Care, [id number]- Manual therapy, [id number]- Gait training, [phone]- Orthotic Initial, [phone]- Orthotic/Prosthetic subsequent, [phone]- Aquatic Therapy, [phone]- Electrical stimulation (manual), [id number]- Ultrasound, [id number]- Traction (mechanical), [phone] (1-2 muscles), [id number] (3+ muscles)- Dry Needling, Patient/Family education, Balance training, Stair training, Taping, Joint mobilization, Spinal mobilization, Compression bandaging, Cognitive remediation, DME instructions, Cryotherapy, Moist heat, and Biofeedback  PLAN FOR  NEXT SESSION: Check on HEP; balance, strengthening, aerobic endurance; coordination.   Damien Caulk, PT 11/17/2024, 8:43 AM

## 2024-11-18 ENCOUNTER — Ambulatory Visit: Admitting: Physical Therapy

## 2024-11-18 ENCOUNTER — Encounter: Payer: Self-pay | Admitting: Physical Therapy

## 2024-11-18 VITALS — BP 97/51 | HR 53

## 2024-11-18 DIAGNOSIS — R490 Dysphonia: Secondary | ICD-10-CM | POA: Diagnosis not present

## 2024-11-18 DIAGNOSIS — M6281 Muscle weakness (generalized): Secondary | ICD-10-CM

## 2024-11-18 DIAGNOSIS — R2689 Other abnormalities of gait and mobility: Secondary | ICD-10-CM

## 2024-11-18 NOTE — Therapy (Signed)
 " OUTPATIENT PHYSICAL THERAPY NEURO TREATMENT   Patient Name: [name] MRN: [id number] DOB:Sep 15, 1941, 83 y.o., female Today's Date: 11/18/2024  PCP: [name], MD REFERRING PROVIDER: Gregg Lek, MD  END OF SESSION:  PT End of Session - 11/18/24 1408     Visit Number 5    Number of Visits 17   16 plus Eval   Date for Recertification  01/09/25   for scheduling delays   Authorization Type [employer]    PT Start Time 1406   pt arrived late   PT Stop Time 1447    PT Time Calculation (min) 41 min    Equipment Utilized During Treatment Gait belt    Activity Tolerance Patient tolerated treatment well   with pacing/rest breaks   Behavior During Therapy WFL for tasks assessed/performed          Past Medical History:  Diagnosis Date   Allergic rhinitis    Arnold-Chiari malformation (HCC)    Asthma    CAD (coronary artery disease)    COPD (chronic obstructive pulmonary disease) (HCC)    Coughing    coughing since last 01-22-2019 , started on cefdinir bid x10 days, has 3 pills left today , reports she feels mcuh better , still coughing copiuus amonts of thick white sputum , denies fever nor chills, nor body aches .    DJD (degenerative joint disease)    GERD (gastroesophageal reflux disease)    Hiatal hernia    History of absence seizures    last confirmed seizure age 6     Hx of colonic polyps    Hypercholesteremia    Hypertension    Obesity    Positive PPD    Reflex sympathetic dystrophy    Sleep apnea    Varicose veins    Venous insufficiency    Past Surgical History:  Procedure Laterality Date   ABDOMINAL HYSTERECTOMY     CHOLECYSTECTOMY N/A 02/19/2023   Procedure: LAPAROSCOPIC CHOLECYSTECTOMY WITH INTRAOPERATIVE CHOLANGIOGRAM AND ICG DYE;  Surgeon: Tanda Locus, MD;  Location: [facility] OR;  Service: General;  Laterality: N/A;   cspine surgery  12/02/1991   for arnold-chiari malformation   IR BONE MARROW BIOPSY & ASPIRATION  01/28/2023   JOINT  REPLACEMENT  06/05/2011   Left total knee replacement   right knee arthroscopy  12/02/2007   Dr. Duwayne   right total knee replacement  05/31/2008   Dr. Duwayne   TONSILLECTOMY     TOTAL KNEE REVISION Left 02/03/2019   Procedure: LEFT TOTAL KNEE REVISION;  Surgeon: Fidel Rogue, MD;  Location: [facility] ORS;  Service: Orthopedics;  Laterality: Left;   Patient Active Problem List   Diagnosis Date Noted   Seasonal and perennial allergic rhinitis 07/18/2024   History of smoking 25-50 pack years 07/18/2024   Painful swallowing 07/18/2024   Abnormal CT scan 12/19/2022   Pain of upper abdomen 12/19/2022   Dilation of biliary tract 12/16/2022   Cellulitis of right leg 06/18/2022   Hyperglycemia 06/18/2022   Laceration of right index finger 05/08/2022   Acquired hallux valgus of left foot 11/22/2021   Primary localized osteoarthrosis of ankle and foot 11/22/2021   AKI (acute kidney injury) 03/13/2021   Memory loss 03/13/2021   Anxiety 03/13/2021   Hypokalemia 03/05/2021   Generalized weakness 03/04/2021   Dizziness 03/04/2021   Vitamin D  deficiency 01/16/2021   Cough 12/13/2020   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   Allergic rhinitis due to animal (cat) (dog) hair and dander 12/04/2020   Allergic  rhinitis due to pollen 12/04/2020   Moderate persistent asthma, uncomplicated 12/04/2020   Radiculopathy, lumbar region 11/19/2020   Spondylolisthesis, lumbar region 11/19/2020   Leg cramping 11/06/2020   Degeneration of lumbar intervertebral disc 09/27/2020   Degenerative spondylolisthesis 08/15/2020   Pes anserinus bursitis of right knee 05/07/2020   Ulcer of toe, left, limited to breakdown of skin (HCC) 08/31/2019   Diastolic CHF, chronic (HCC) 08/31/2019   PVD (peripheral vascular disease) 08/31/2019   Bilateral carpal tunnel syndrome 08/19/2019   Posterior neck pain 08/19/2019   Vertigo 04/08/2019   Anemia 04/08/2019   Stiffness of left knee 02/10/2019   Failed total knee, left 02/03/2019   Failed  total knee, left, initial encounter 02/03/2019   Carpal tunnel syndrome of left wrist 01/21/2019   Pain of left hand 01/21/2019   Preop exam for internal medicine 11/02/2018   Left hand paresthesia 11/02/2018   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 11/01/2018   Hyperopia with presbyopia, bilateral 06/29/2018   Abdominal pain, lower 03/02/2018   Diarrhea 03/02/2018   Encounter for well adult exam with abnormal findings 11/14/2017   Arnold-Chiari malformation (HCC)    Asthma, well controlled    CAD (coronary artery disease)    COPD (chronic obstructive pulmonary disease) (HCC)    DJD (degenerative joint disease)    History of absence seizures    Hx of colonic polyps    Hypercholesteremia    Positive PPD    Venous insufficiency    Pain in left lower leg 07/20/2017   Cellulitis of left lower extremity 06/18/2017   Bilateral lower extremity edema 04/30/2017   Shingles 02/26/2017   COPD exacerbation (HCC) 01/07/2017   S/P TKR (total knee replacement), bilateral 01/07/2017   Right arm pain 05/16/2015   Generalized anxiety disorder 08/31/2014   Varicose veins of bilateral lower extremities with other complications 01/06/2012   Edema 08/07/2011   MENORRHAGIA, POSTMENOPAUSAL 08/13/2010   CIGARETTE SMOKER 06/11/2010   CARDIAC MURMUR 11/02/2009   Bradycardia 11/01/2009   CHEST PAIN 11/01/2009   Allergic rhinitis 04/19/2009   Chronic obstructive airway disease with asthma (HCC) 04/18/2009   LEG CRAMPS, NOCTURNAL 11/02/2008   Essential hypertension 01/17/2008   COLONIC POLYPS 01/14/2008   Body mass index (BMI) 36.0-36.9, adult 01/14/2008   Reflex sympathetic dystrophy 01/14/2008   Coronary atherosclerosis 01/14/2008   LPRD (laryngopharyngeal reflux disease) 01/14/2008   Osteoarthritis 01/14/2008   BACK PAIN, LUMBAR 01/14/2008   SEIZURES, HX OF 01/14/2008    ONSET DATE: 09/30/2024 (date of referral)  REFERRING DIAG: R53.1 (ICD-10-CM) - Weakness M54.2 (ICD-10-CM) -  Cervicalgia  THERAPY DIAG:  Other abnormalities of gait and mobility  Muscle weakness (generalized)  Rationale for Evaluation and Treatment: Rehabilitation  SUBJECTIVE:  Likes to be called [name]  SUBJECTIVE STATEMENT: Pt reports no current pain; has patch on L LE d/t pain this morning; took medication for pain today.  No recent falls.  Running late d/t working around home and lost track of time.  Steroid injection (in low back) is helping so far.  L cheek pain a lot better today. Pt accompanied by: self  PERTINENT HISTORY: Pt with tremors and weakness (noted back in June; went to ED; MRI brain did not show acute abnormalities).  Difficulty eating and holding objects; difficulty writing; tremors worsen during the day.  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  Per PT referral: Cervicalgia. Left side weakness. 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  PAIN:  Are you having pain? Yes: NPRS scale: 0/10 Pain location: back, stomach, and legs Pain description: sharp shooting; aching Aggravating factors: standing a lot Relieving factors: pain patches; medication cream  PRECAUTIONS: Fall; Swallowing difficulties (with pills and certain foods; not liquids/water )  RED FLAGS: None   WEIGHT BEARING RESTRICTIONS: No  FALLS: Has patient fallen in last 6 months? No  LIVING ENVIRONMENT: Lives with: lives with their family (grandson) Lives in: House/apartment Stairs: Yes: External: 5 steps; on right going up, on left going up, and can reach both Has  following equipment at home: Single point cane and Walker - 2 wheeled  PLOF: Ambulates with RW (last 6 months or more).  Participated in OP PT first part of this year.  Was doing water  aerobics at [facility] (hasn't done in last 5 months d/t stomach acting up).  PATIENT GOALS: Try to get strength back and do some of the things she was able to do before.  OBJECTIVE:  Note: Objective measures were completed at Evaluation unless otherwise noted.  DIAGNOSTIC FINDINGS:  MRI C-spine 09/26/24: - Multilevel spondylosis, disc bulging, reversal of normal cervical curvature and grade 2 anterior spondylolisthesis of C2 and C3 measuring 7 mm. - At C6-7 disc bulging and facet hypertrophy with moderate to severe spinal stenosis and severe bilateral foraminal stenosis; no cord signal abnormalities. - At C2-3 pseudo disc bulging and facet hypertrophy with moderate spinal stenosis and severe bilateral foraminal stenosis; no cord signal abnormalities. - At C3-4 disc bulging with mild spinal stenosis and severe bilateral foraminal stenosis; no cord signal abnormalities.  COGNITION: Overall cognitive status: Within functional limits for tasks assessed   SENSATION: Decreased light touch sensation L lateral thigh, medial lower leg, and entire foot  COORDINATION: Decreased alternating toe taps L LE compared to R LE  EDEMA: (chronic L>R LE per pt report) Circumferential: malleolar line 27.5 cm R ankle; 28 cm L ankle  POSTURE: rounded shoulders and forward head  LOWER EXTREMITY MMT:    MMT Right Eval Left Eval  Hip flexion 4+/5 4+/5  Hip extension    Hip abduction    Hip adduction    Hip internal rotation    Hip external rotation    Knee flexion    Knee extension 4+/5 4+/5  Ankle dorsiflexion 4+/5 2/5  Ankle plantarflexion At least 3/5 AROM At least 2/5 AROM  Ankle inversion    Ankle eversion    (Blank rows = not tested)  TRANSFERS: Sit to stand: Modified independence  Assistive device utilized:  Environmental Consultant - 2 wheeled     Stand  to sit: Modified independence  Assistive device utilized: Environmental Consultant - 2 wheeled      GAIT: Findings: Gait Characteristics: narrow BOS; R LE ER>L LE; foot flat R LE; decreased L LE DF with forefoot initial WB'ing during initial contact L LE; increased L trunk lean; short steps, Distance walked: clinic distances, Assistive device utilized:Walker - 2 wheeled, Level of assistance: Modified independence, and Comments: decreased gait speed  FUNCTIONAL TESTS:  5 times sit to stand: 49.78 seconds with B UE support (up to RW) 10 meter walk test: 0.31 m/sec (31.63 seconds with RW use) BERG Balance Test: 22/56 Item T/est date: 11/07/24 Date:  Date:   Sitting to standing 3. able to stand independently using hands Insert SmartPhrase OPRCBERGREEVAL Insert SmartPhrase OPRCBERGREEVAL  2. Standing unsupported 3. able to stand 2 minutes with supervision    3. Sitting with back unsupported, feet supported 4. able to sit safely and securely for 2 minutes    4. Standing to sitting 3. controls descent by using hands    5. Pivot transfer  3. able to transfer safely with definite need of hands    6. Standing unsupported with eyes closed 3. able to stand 10 seconds with supervision    7. Standing unsupported with feet together 1. needs help to attain position but able to stand 15 seconds feet together    8. Reaching forward with outstretched arms while standing 1. reaches forward but needs supervision    9. Pick up object from the floor from standing 0. unable to try/needs assistance to keep from losing balance or falling; needs assist for balance    10. Turning to look behind over left and right shoulders while standing 1. needs supervision when turning    11. Turn 360 degrees 0. needs assistance while turning    12. Place alternate foot on step or stool while standing unsupported 0. needs assistance to keep from falling/unable to try; assist to keep balance with 2 attempts    13. Standing  unsupported one foot in front 0. loses balance while stepping or standing    14. Standing on one leg 0. unable to try of needs assist to prevent fall ; need assist to maintain balance     Total Score 22/56 Total Score:    Total Score:      PATIENT SURVEYS:  TBA                                                                                                                             TREATMENT DATE: 11/18/24  Self Care: BP and HR taken in sitting at rest beginning of session (see below for details).  Pt reporting being asymptomatic.  Monitored for symptoms during session. Vitals:   11/18/24 1413  BP: (!) 97/51  Pulse: (!) 53  HR 60 bpm at rest end of session.  Therapeutic Exercise: SciFit multi-peaks up to level 3 for 8 minutes using BUE/BLEs for global strengthening, dynamic cardiovascular warmup  and increased amplitude of stepping. RPE of 6/10 following activity.  Average stride length 7.7 inches. Notes: intermittent vc's for increasing stride length.  Therapeutic Activity: Sit to stand on Airex: x5 reps with B UE support (CGA for safety) x5 reps x2 sets no UE support (CGA to min assist for balance especially with fatigue) Standing balance on Airex: x1 minute x 3 sets (no UE support) static standing with feet shoulder width apart Notes: use of ankle strategies noted; CGA to occasional min assist (tends to lean to L at times but improved on last trial)  PATIENT EDUCATION: Education details: Continue HEP. Person educated: Patient Education method: Explanation, Demonstration, Verbal cues, and Handouts Education comprehension: verbalized understanding and needs further education  HOME EXERCISE PROGRAM: Access Code: [email or web address] URL: [email or web address] Date: 11/07/2024 Prepared by: Damien Caulk  Exercises - Seated March  - 1 x daily - 5 x weekly - 1-2 sets - 10 reps - Seated Long Arc Quad  - 1 x daily - 5 x weekly - 1-2 sets - 10 reps - Seated Hip Abduction  with Resistance  - 1 x daily - 5 x weekly - 1-2 sets - 10 reps - Seated Heel Raise  - 1 x daily - 5 x weekly - 1-2 sets - 10 reps - Seated Toe Raise  - 1 x daily - 5 x weekly - 1-2 sets - 10 reps  GOALS: Goals reviewed with patient? Yes  SHORT TERM GOALS: Target date: 11/28/2024  Pt will be independent with initial HEP in order to improve strength and balance in order to decrease fall risk and improve function at home for ADL's. Baseline: TBA Goal status: INITIAL  2.  Assess BERG Balance Test and update LTG as needed. Baseline: 22/56 11/07/24 Goal status: MET  3.  Pt will decrease 5 Time Sit to Stand by at least 10 seconds in order to demonstrate clinically significant improvement in LE strength. Baseline: 49.78 seconds (Eval) Goal status: INITIAL   LONG TERM GOALS: Target date: 12/26/2024  Pt will be independent with HEP in order to improve strength and balance in order to decrease fall risk and improve function at home for ADL's. Baseline: TBA Goal status: INITIAL  2.  Patient will demonstrate an improved Berg Balance Score of 40 or greater as to demonstrate improved balance with ADLs and reduced fall risk. Baseline: 22/56 11/07/24 Goal status: REVISED  3.  Pt will decrease 5 Time Sit to Stand by at least 20 seconds in order to demonstrate clinically significant improvement in LE strength. Baseline: 49.78 seconds (Eval) Goal status: INITIAL  4.  Pt will increase by at least 0.13 m/s in order to demonstrate clinically significant improvement in community ambulation.  Baseline: 0.31 m/sec Goal status: INITIAL  5.  Pt will demonstrate improved L LE foot clearance for safety with gait. Baseline: Decreased L foot clearance with RW use Goal status: INITIAL  ASSESSMENT:  CLINICAL IMPRESSION: Patient was seen today for physical therapy treatment to address strength, transfers, and balance.  Focused session on global strengthening, improving quality of transfers; and static  balance on compliant surface.  Patient continues to be limited by strength and balance.  They demonstrate improvement in static balance on compliant surface with practice during session.  They would continue to benefit from skilled PT to address impairments as noted and progress towards long term goals.  OBJECTIVE IMPAIRMENTS: Abnormal gait, cardiopulmonary status limiting activity, decreased activity tolerance, decreased balance, decreased coordination, decreased endurance, decreased knowledge of condition, decreased  knowledge of use of DME, decreased mobility, difficulty walking, decreased ROM, decreased strength, increased edema, impaired flexibility, impaired sensation, improper body mechanics, postural dysfunction, and pain.   ACTIVITY LIMITATIONS: carrying, lifting, bending, standing, squatting, stairs, transfers, bed mobility, continence, toileting, dressing, locomotion level, and caring for others  PARTICIPATION LIMITATIONS: meal prep, cleaning, laundry, shopping, community activity, and yard work  PERSONAL FACTORS: Age, Fitness, Past/current experiences, and 1-2 comorbidities: COPD, HFpEF, bradycardia, and memory loss are also affecting patient's functional outcome.   REHAB POTENTIAL: Good  CLINICAL DECISION MAKING: Evolving/moderate complexity  EVALUATION COMPLEXITY: Moderate  PLAN:  PT FREQUENCY: 2x/week  PT DURATION: 8 weeks  PLANNED INTERVENTIONS: 97164- PT Re-evaluation, 97750- Physical Performance Testing, 97110-Therapeutic exercises, 97530- Therapeutic activity, [id number]- Neuromuscular re-education, 97535- Self Care, [id number]- Manual therapy, [id number]- Gait training, [phone]- Orthotic Initial, [phone]- Orthotic/Prosthetic subsequent, [phone]- Aquatic Therapy, [phone]- Electrical stimulation (manual), [id number]- Ultrasound, [id number]- Traction (mechanical), [phone] (1-2 muscles), [id number] (3+ muscles)- Dry Needling, Patient/Family education, Balance training, Stair training, Taping, Joint mobilization, Spinal  mobilization, Compression bandaging, Cognitive remediation, DME instructions, Cryotherapy, Moist heat, and Biofeedback  PLAN FOR NEXT SESSION: Check on HEP; balance, strengthening, aerobic endurance; coordination.   Damien Caulk, PT 11/18/2024, 4:40 PM        "

## 2024-11-21 ENCOUNTER — Encounter: Payer: Self-pay | Admitting: Physical Therapy

## 2024-11-21 ENCOUNTER — Ambulatory Visit: Admitting: Physical Therapy

## 2024-11-21 ENCOUNTER — Ambulatory Visit

## 2024-11-21 VITALS — BP 135/78 | HR 50

## 2024-11-21 DIAGNOSIS — R490 Dysphonia: Secondary | ICD-10-CM

## 2024-11-21 DIAGNOSIS — M6281 Muscle weakness (generalized): Secondary | ICD-10-CM

## 2024-11-21 DIAGNOSIS — R2689 Other abnormalities of gait and mobility: Secondary | ICD-10-CM

## 2024-11-21 NOTE — Therapy (Signed)
 " OUTPATIENT SPEECH LANGUAGE PATHOLOGY VOICE TREATMENT   Patient Name: [name] MRN: [id number] DOB:September 16, 1941, 83 y.o., female Today's Date: 11/21/2024  PCP: [name], MD REFERRING PROVIDER: Mila Adah JONELLE, DO  END OF SESSION:  End of Session - 11/21/24 1357     Visit Number 4    Number of Visits 11    Date for Recertification  01/09/25    SLP Start Time 1317    SLP Stop Time  1358    SLP Time Calculation (min) 41 min    Activity Tolerance Patient tolerated treatment well             Past Medical History:  Diagnosis Date   Allergic rhinitis    Arnold-Chiari malformation (HCC)    Asthma    CAD (coronary artery disease)    COPD (chronic obstructive pulmonary disease) (HCC)    Coughing    coughing since last 01-22-2019 , started on cefdinir bid x10 days, has 3 pills left today , reports she feels mcuh better , still coughing copiuus amonts of thick white sputum , denies fever nor chills, nor body aches .    DJD (degenerative joint disease)    GERD (gastroesophageal reflux disease)    Hiatal hernia    History of absence seizures    last confirmed seizure age 64     Hx of colonic polyps    Hypercholesteremia    Hypertension    Obesity    Positive PPD    Reflex sympathetic dystrophy    Sleep apnea    Varicose veins    Venous insufficiency    Past Surgical History:  Procedure Laterality Date   ABDOMINAL HYSTERECTOMY     CHOLECYSTECTOMY N/A 02/19/2023   Procedure: LAPAROSCOPIC CHOLECYSTECTOMY WITH INTRAOPERATIVE CHOLANGIOGRAM AND ICG DYE;  Surgeon: Tanda Locus, MD;  Location: [facility] OR;  Service: General;  Laterality: N/A;   cspine surgery  12/02/1991   for arnold-chiari malformation   IR BONE MARROW BIOPSY & ASPIRATION  01/28/2023   JOINT REPLACEMENT  06/05/2011   Left total knee replacement   right knee arthroscopy  12/02/2007   Dr. Duwayne   right total knee replacement  05/31/2008   Dr. Duwayne   TONSILLECTOMY     TOTAL KNEE REVISION Left 02/03/2019    Procedure: LEFT TOTAL KNEE REVISION;  Surgeon: Fidel Rogue, MD;  Location: [facility] ORS;  Service: Orthopedics;  Laterality: Left;   Patient Active Problem List   Diagnosis Date Noted   Seasonal and perennial allergic rhinitis 07/18/2024   History of smoking 25-50 pack years 07/18/2024   Painful swallowing 07/18/2024   Abnormal CT scan 12/19/2022   Pain of upper abdomen 12/19/2022   Dilation of biliary tract 12/16/2022   Cellulitis of right leg 06/18/2022   Hyperglycemia 06/18/2022   Laceration of right index finger 05/08/2022   Acquired hallux valgus of left foot 11/22/2021   Primary localized osteoarthrosis of ankle and foot 11/22/2021   AKI (acute kidney injury) 03/13/2021   Memory loss 03/13/2021   Anxiety 03/13/2021   Hypokalemia 03/05/2021   Generalized weakness 03/04/2021   Dizziness 03/04/2021   Vitamin D  deficiency 01/16/2021   Cough 12/13/2020   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   Allergic rhinitis due to animal (cat) (dog) hair and dander 12/04/2020   Allergic rhinitis due to pollen 12/04/2020   Moderate persistent asthma, uncomplicated 12/04/2020   Radiculopathy, lumbar region 11/19/2020   Spondylolisthesis, lumbar region 11/19/2020   Leg cramping 11/06/2020   Degeneration of lumbar intervertebral disc 09/27/2020  Degenerative spondylolisthesis 08/15/2020   Pes anserinus bursitis of right knee 05/07/2020   Ulcer of toe, left, limited to breakdown of skin (HCC) 08/31/2019   Diastolic CHF, chronic (HCC) 08/31/2019   PVD (peripheral vascular disease) 08/31/2019   Bilateral carpal tunnel syndrome 08/19/2019   Posterior neck pain 08/19/2019   Vertigo 04/08/2019   Anemia 04/08/2019   Stiffness of left knee 02/10/2019   Failed total knee, left 02/03/2019   Failed total knee, left, initial encounter 02/03/2019   Carpal tunnel syndrome of left wrist 01/21/2019   Pain of left hand 01/21/2019   Preop exam for internal medicine 11/02/2018   Left hand paresthesia 11/02/2018    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 11/01/2018   Hyperopia with presbyopia, bilateral 06/29/2018   Abdominal pain, lower 03/02/2018   Diarrhea 03/02/2018   Encounter for well adult exam with abnormal findings 11/14/2017   Arnold-Chiari malformation (HCC)    Asthma, well controlled    CAD (coronary artery disease)    COPD (chronic obstructive pulmonary disease) (HCC)    DJD (degenerative joint disease)    History of absence seizures    Hx of colonic polyps    Hypercholesteremia    Positive PPD    Venous insufficiency    Pain in left lower leg 07/20/2017   Cellulitis of left lower extremity 06/18/2017   Bilateral lower extremity edema 04/30/2017   Shingles 02/26/2017   COPD exacerbation (HCC) 01/07/2017   S/P TKR (total knee replacement), bilateral 01/07/2017   Right arm pain 05/16/2015   Generalized anxiety disorder 08/31/2014   Varicose veins of bilateral lower extremities with other complications 01/06/2012   Edema 08/07/2011   MENORRHAGIA, POSTMENOPAUSAL 08/13/2010   CIGARETTE SMOKER 06/11/2010   CARDIAC MURMUR 11/02/2009   Bradycardia 11/01/2009   CHEST PAIN 11/01/2009   Allergic rhinitis 04/19/2009   Chronic obstructive airway disease with asthma (HCC) 04/18/2009   LEG CRAMPS, NOCTURNAL 11/02/2008   Essential hypertension 01/17/2008   COLONIC POLYPS 01/14/2008   Body mass index (BMI) 36.0-36.9, adult 01/14/2008   Reflex sympathetic dystrophy 01/14/2008   Coronary atherosclerosis 01/14/2008   LPRD (laryngopharyngeal reflux disease) 01/14/2008   Osteoarthritis 01/14/2008   BACK PAIN, LUMBAR 01/14/2008   SEIZURES, HX OF 01/14/2008    Onset date: Voice: ~3 months ago,                       Swallowing: Beginning of the year.  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 Pt accompanied by: self  PERTINENT HISTORY: (From 08/31/24 by Dr. Okey) NAZIYAH [name] is an 83 year old female with a history of hiatal hernia on EGD 20 yrs ago, who presents with difficulty swallowing.   She has been experiencing difficulty swallowing for the past three months, describing it as a sensation of food not wanting to go down, particularly when taking medication. This occurs most of the time but not always. No recent illness preceded the onset of these symptoms.   A swallow study conducted in August was normal, but it only assessed the upper portion of her swallow. An MRI of the brain  was also normal, though there are some changes noted in the cervical spine.   She has a history of reflux and heartburn for which she is taking medication, and she reports that it helps. She has previously seen a GI doctor and underwent an upper endoscopy in 2005, which revealed a hiatal hernia.   She experiences hoarseness from time to time as well.  PAIN:  Are you having pain? Yes: Pain description: Back pain and arthritis pain.  FALLS: Has patient fallen in last 6 months? No, Number of falls: 0  LIVING ENVIRONMENT: Lives with: grandson Lives in: House/apartment  PLOF:Level of assistance: Independent with ADLs Employment: Retired  PATIENT GOALS: To improve voice and reduce perceived swallowing difficulties.  OBJECTIVE:  Note: Objective measures were completed at Evaluation unless otherwise noted.  DIAGNOSTIC FINDINGS: Flexible Fiberoptic Laryngoscopy Procedure Note (From 10/21/24) Pre-Op Diagnosis: hoarseness                                Post Op Diagnosis: same Procedure: Flexible Fiberoptic Laryngoscopy CPT 31575 - Mod 25 Surgeon: Adah Malkin, D.O. 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 It was passed along the floor of the nasal cavity to the nasopharynx. Torus tubarius was patent and the Fossa of Rosenmller was identified. The scope was then flexed caudally and advanced slowly through the nasopharynx, passed through the oropharynx, and down into the hypopharynx. The patient's oro- and nasopharynx were unremarkable with no signs of any gross lesions, edema, masses, or bleeding.  The base of tongue was visualized and no mass, ulceration or lesion was appreciated. The epiglottis did not demonstrate any mass, ulceration or lesion. The vallecula was also assessed with no mass, ulceration or lesion. The patient had good glottic closure upon phonation and no signs of aspiration or pooling of secretions. The patient was asked to inspire and expire with the true vocal folds vibrating normally and without evidence of vocal fold dysfunction. The right true vocal fold was noted to have a polypoid growth at the anterior 1/3 approaching the commissure. The true and false vocal cords, interarytenoid, AE folds, and arytenoids did not demonstrate any significant edema or erythema. The patient was then asked to valsalva, and the pyriform sinuses were assessed which were unremarkable. The airway was patent and there was no evidence of compromise. The scope was then slowly withdrawn from the patient. The patient tolerated the procedure well and there were no complications.  Disposition: Stable  COGNITION: Overall cognitive status: Within functional limits for tasks assessed   SOCIAL HISTORY: Occupation: Retired Water  intake: Five 12 oz/day. Caffeine/alcohol  intake: minimal and pt is currently drinking 1 soda/not consistently.  Daily voice use: moderate  PERCEPTUAL VOICE  ASSESSMENT: Voice quality: hoarse, breathy, rough, strained, and low vocal intensity Vocal abuse: habitual abnormal pitch Resonance: normal Respiratory function: thoracic breathing GRBAS: G-2, R-2, B-2, A-2, S-1  OBJECTIVE VOICE ASSESSMENT: Maximum phonation time for sustained ah: 2.8 seconds Conversational pitch average: 105 Hz Conversational pitch range:  90-130 Hz Conversational loudness average: 71 dB Conversational loudness range: 61-81 dB S/z ratio: 2.91 (Suggestive of dysfunction >  1.0)  PATIENT REPORTED OUTCOME MEASURES (PROM): V-RQOL: 33/50                                                                                                                            TREATMENT DATE:   11/21/24: Pt has been practicing her HEP every day since previous session. Pt states that her voice sounds a little better this week. SLP reviewed diaphragmatic breathing. Pt demonstrated diaphragmatic breathing in 75% of opportunities during structured voice exercises given occasional min A. SLP provided direct instruction and modeling for completion of resonant voice therapy exercises to encourage forward resonance and decrease tension when voicing. Pt demonstrated forward-resonance with 70% consistency during structured exercises at the word, phrase, and sentence level with /m/ and /n/ sounds, provided with frequent min-A from SLP. Pt benefitting from cues to increase forward placement of the voice. Updated HEP to include /n/ words, phrases, and sentences for continued resonant voice work. Plan is to continue resonant voice therapy and respiratory muscle strength training for decreasing laryngeal hyperfunction.  11/16/24: Pt has been practicing her HEP every day since previous session. Pt is trying to drink 5-6 water  bottles, which is an improvement from prior session. Pt was 65% consistent with performing diaphragmatic breathing given frequent min A. SLP reviewed Resonant Voice Therapy (RVT) for  maximizing forward resonance during voice production. During /b/ words, pt was 70% consistent with achieving forward resonance during voice production given frequent min A. SLP challenged pt to utilize forward resonance during /m/ words. Pt achieved clear voice in 65% of opportunities during /m/ word productions given frequent mod A. Suspect that pt's breath management is a major factor for reduced performance. SLP introduced Respiratory Muscle Strength Training (RMST) as a tool for improving expiratory muscle strength for voice production. SLP began guiding pt through using RMST device; pt displayed emerging understanding of device usage. Pt will need reinforcement in upcoming session for improving understanding and performance with device. Pt is making progress with voice goals. Plan is to continue RVT and RMST for optimizing voice production for QOL.  11/09/24: SLP had pt complete EAT-10 to assess self-perception of swallow ability. Pt notes that swallowing pills and solids is more difficult than liquids. SLP introduced strategies for vocal hygiene (increased water  intake, GERD lifestyle changes). Pt is agreeable to making adjustments to water  intake and utilizing lifestyle changes for GERD to optimize vocal health and wellness. SLP engaged pt in respiratory retraining, focusing on establishing diaphragmatic breath for balance phonation. After SLP guided demonstration, pt was 70% consistent with performing diaphragmatic breathing given frequent min A. SLP reintroduced Resonant Voice Therapy (RVT) for maximizing forward resonance during voice production. During /b/ words, pt was 75% consistent with achieving forward resonance during voice production given frequent min A. Pt is making progress with voice goals. Plan is to continue RVT and introduce vocal function exercises for reducing laryngeal hyperfunction.   10/31/24: Evaluation completed. SLP initiated tx on this date. SLP performed stimulability  with pt to  measure prognosis with voice therapy exercises. SLP introduced Resonant Voice Therapy (RVT) through /b/ syllables and words. Pt demonstrated ability to achieve clear voicing using RVT. SLP educated pt about basic physiology of the voice (respiration, phonation, articulation/resonance) for optimizing pt understanding of voice exercises. Pt demonstrated emerging understanding of physiology of voice; will need reinforcement in future sessions. Plan is to continue RVT and introduced vocal function exercises (VFEs) for maximizing clear voicing for QOL.    PATIENT EDUCATION: Education details: Physiology of voice Person educated: Patient Education method: Explanation Education comprehension: verbalized understanding  HOME EXERCISE PROGRAM: TBD in upcoming sessions.  GOALS: Goals reviewed with patient? Yes  SHORT TERM GOALS: Target date: 12/03/24  Pt will utilize diaphragmatic breathing during structured exercises and conversation in 80% of opportunities given occasional min A. Baseline: Pt uses thoracic breathing pattern. Goal status: INITIAL  2.  Pt will achieve clear voicing during structured exercises and conversation in 80% of opportunities across 2 sessions given occasional min A. Baseline:  Goal status: INITIAL  3.  Pt will describe the basic physiology of the voice (Breathing, Phonation, Artic/Resonance) as related to voice therapy exercises with 90% consistency given rare min A.  Baseline:  Goal status: INITIAL  4.  Pt will describe at least 4 swallow precautions/exercises to given min A.  Baseline:  Goal status: INITIAL  5.  Pt will perform structured, sustained phonation exercises with clear voicing in 80% of opportunities when given occasional min A.  Baseline:  Goal status: INITIAL  6.  Pt will complete PROM for swallowing/reflux. Baseline:  Goal status: MET  LONG TERM GOALS: Target date: 01/09/25  Pt will achieve clear voicing during unstructured conversation in 85%  of opportunities given rare min A.  Baseline: Pt voice is dysphonic.  Goal status: INITIAL  2.  Pt will report decreased score on PROM (V-RQOL), displaying self-perceived improvement of voice. Baseline: 33/50 Goal status: INITIAL  3.  Pt will describe and utilize swallow precautions to minimize self-reported swallowing difficulties.  Baseline:  Goal status: INITIAL   ASSESSMENT:  CLINICAL IMPRESSION: Patient is a 83 y.o. female who was seen today for a behavioral voice evaluation. Pt presents with a moderate dysphonia characterized by rough, breathy, and hoarse vocal quality and 33/50 on V-RQOL scale. According to PROM, pt frequently experiences depressive thoughts and avoids socializing in public due to dysphonia. Pt noted that her dysphonia has affected her ability to sing. Pt was a regular singer up until 3 months ago when her dysphonia started. In addition, pt reported trouble swallowing, specifically with pills. Pt had MBSS recently (07/08/24), with no significant findings. Pt does note hx of reflux and that she currently takes medication for reflux.   OBJECTIVE IMPAIRMENTS: include voice disorder. These impairments are limiting patient from effectively communicating at home and in community. Factors affecting potential to achieve goals and functional outcome are N/A. Patient will benefit from skilled SLP services to address above impairments and improve overall function.  REHAB POTENTIAL: Good  PLAN:  SLP FREQUENCY: 1x/week  SLP DURATION: 10 weeks  PLANNED INTERVENTIONS: Aspiration precaution training, SLP instruction and feedback, Compensatory strategies, and Patient/family education    [employer], CF-SLP 11/21/2024, 2:04 PM      "

## 2024-11-21 NOTE — Therapy (Signed)
 " OUTPATIENT PHYSICAL THERAPY NEURO TREATMENT   Patient Name: [name] MRN: [id number] DOB:09-05-1941, 83 y.o., female Today's Date: 11/21/2024  PCP: [name], MD REFERRING PROVIDER: Gregg Lek, MD  END OF SESSION:  PT End of Session - 11/21/24 1404     Visit Number 6    Number of Visits 17   16 plus Eval   Date for Recertification  01/09/25   for scheduling delays   Authorization Type [employer]    PT Start Time 1403    PT Stop Time 1443    PT Time Calculation (min) 40 min    Equipment Utilized During Treatment Gait belt    Activity Tolerance Patient tolerated treatment well   with pacing/rest breaks   Behavior During Therapy WFL for tasks assessed/performed          Past Medical History:  Diagnosis Date   Allergic rhinitis    Arnold-Chiari malformation (HCC)    Asthma    CAD (coronary artery disease)    COPD (chronic obstructive pulmonary disease) (HCC)    Coughing    coughing since last 01-22-2019 , started on cefdinir bid x10 days, has 3 pills left today , reports she feels mcuh better , still coughing copiuus amonts of thick white sputum , denies fever nor chills, nor body aches .    DJD (degenerative joint disease)    GERD (gastroesophageal reflux disease)    Hiatal hernia    History of absence seizures    last confirmed seizure age 34     Hx of colonic polyps    Hypercholesteremia    Hypertension    Obesity    Positive PPD    Reflex sympathetic dystrophy    Sleep apnea    Varicose veins    Venous insufficiency    Past Surgical History:  Procedure Laterality Date   ABDOMINAL HYSTERECTOMY     CHOLECYSTECTOMY N/A 02/19/2023   Procedure: LAPAROSCOPIC CHOLECYSTECTOMY WITH INTRAOPERATIVE CHOLANGIOGRAM AND ICG DYE;  Surgeon: Tanda Locus, MD;  Location: [facility] OR;  Service: General;  Laterality: N/A;   cspine surgery  12/02/1991   for arnold-chiari malformation   IR BONE MARROW BIOPSY & ASPIRATION  01/28/2023   JOINT REPLACEMENT  06/05/2011    Left total knee replacement   right knee arthroscopy  12/02/2007   Dr. Duwayne   right total knee replacement  05/31/2008   Dr. Duwayne   TONSILLECTOMY     TOTAL KNEE REVISION Left 02/03/2019   Procedure: LEFT TOTAL KNEE REVISION;  Surgeon: Fidel Rogue, MD;  Location: [facility] ORS;  Service: Orthopedics;  Laterality: Left;   Patient Active Problem List   Diagnosis Date Noted   Seasonal and perennial allergic rhinitis 07/18/2024   History of smoking 25-50 pack years 07/18/2024   Painful swallowing 07/18/2024   Abnormal CT scan 12/19/2022   Pain of upper abdomen 12/19/2022   Dilation of biliary tract 12/16/2022   Cellulitis of right leg 06/18/2022   Hyperglycemia 06/18/2022   Laceration of right index finger 05/08/2022   Acquired hallux valgus of left foot 11/22/2021   Primary localized osteoarthrosis of ankle and foot 11/22/2021   AKI (acute kidney injury) 03/13/2021   Memory loss 03/13/2021   Anxiety 03/13/2021   Hypokalemia 03/05/2021   Generalized weakness 03/04/2021   Dizziness 03/04/2021   Vitamin D  deficiency 01/16/2021   Cough 12/13/2020   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   Allergic rhinitis due to animal (cat) (dog) hair and dander 12/04/2020   Allergic rhinitis due to pollen  12/04/2020   Moderate persistent asthma, uncomplicated 12/04/2020   Radiculopathy, lumbar region 11/19/2020   Spondylolisthesis, lumbar region 11/19/2020   Leg cramping 11/06/2020   Degeneration of lumbar intervertebral disc 09/27/2020   Degenerative spondylolisthesis 08/15/2020   Pes anserinus bursitis of right knee 05/07/2020   Ulcer of toe, left, limited to breakdown of skin (HCC) 08/31/2019   Diastolic CHF, chronic (HCC) 08/31/2019   PVD (peripheral vascular disease) 08/31/2019   Bilateral carpal tunnel syndrome 08/19/2019   Posterior neck pain 08/19/2019   Vertigo 04/08/2019   Anemia 04/08/2019   Stiffness of left knee 02/10/2019   Failed total knee, left 02/03/2019   Failed total knee, left, initial  encounter 02/03/2019   Carpal tunnel syndrome of left wrist 01/21/2019   Pain of left hand 01/21/2019   Preop exam for internal medicine 11/02/2018   Left hand paresthesia 11/02/2018   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 11/01/2018   Hyperopia with presbyopia, bilateral 06/29/2018   Abdominal pain, lower 03/02/2018   Diarrhea 03/02/2018   Encounter for well adult exam with abnormal findings 11/14/2017   Arnold-Chiari malformation (HCC)    Asthma, well controlled    CAD (coronary artery disease)    COPD (chronic obstructive pulmonary disease) (HCC)    DJD (degenerative joint disease)    History of absence seizures    Hx of colonic polyps    Hypercholesteremia    Positive PPD    Venous insufficiency    Pain in left lower leg 07/20/2017   Cellulitis of left lower extremity 06/18/2017   Bilateral lower extremity edema 04/30/2017   Shingles 02/26/2017   COPD exacerbation (HCC) 01/07/2017   S/P TKR (total knee replacement), bilateral 01/07/2017   Right arm pain 05/16/2015   Generalized anxiety disorder 08/31/2014   Varicose veins of bilateral lower extremities with other complications 01/06/2012   Edema 08/07/2011   MENORRHAGIA, POSTMENOPAUSAL 08/13/2010   CIGARETTE SMOKER 06/11/2010   CARDIAC MURMUR 11/02/2009   Bradycardia 11/01/2009   CHEST PAIN 11/01/2009   Allergic rhinitis 04/19/2009   Chronic obstructive airway disease with asthma (HCC) 04/18/2009   LEG CRAMPS, NOCTURNAL 11/02/2008   Essential hypertension 01/17/2008   COLONIC POLYPS 01/14/2008   Body mass index (BMI) 36.0-36.9, adult 01/14/2008   Reflex sympathetic dystrophy 01/14/2008   Coronary atherosclerosis 01/14/2008   LPRD (laryngopharyngeal reflux disease) 01/14/2008   Osteoarthritis 01/14/2008   BACK PAIN, LUMBAR 01/14/2008   SEIZURES, HX OF 01/14/2008    ONSET DATE: 09/30/2024 (date of referral)  REFERRING DIAG: R53.1 (ICD-10-CM) - Weakness M54.2 (ICD-10-CM) - Cervicalgia  THERAPY DIAG:   Other abnormalities of gait and mobility  Muscle weakness (generalized)  Rationale for Evaluation and Treatment: Rehabilitation  SUBJECTIVE:  Likes to be called [name]  SUBJECTIVE STATEMENT: Pt reports back hurting her again (8/10).  No recent falls.  L cheek very mild soreness (comes and goes). Pt accompanied by: self  PERTINENT HISTORY: Pt with tremors and weakness (noted back in June; went to ED; MRI brain did not show acute abnormalities).  Difficulty eating and holding objects; difficulty writing; tremors worsen during the day.  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  Per PT referral: Cervicalgia. Left side weakness. 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  PAIN:  Are you having pain? Yes: NPRS scale: 8/10 Pain location: back Pain description: sharp shooting; aching Aggravating factors: standing a lot Relieving factors: pain patches; medication cream  PRECAUTIONS: Fall; Swallowing difficulties (with pills and certain foods; not liquids/water )  RED FLAGS: None   WEIGHT BEARING RESTRICTIONS: No  FALLS: Has patient fallen in last 6 months? No  LIVING ENVIRONMENT: Lives with: lives with their family (grandson) Lives in: House/apartment Stairs: Yes: External: 5 steps; on right going up, on left going up, and can reach both Has following equipment at home: Single point cane and Walker - 2 wheeled  PLOF: Ambulates with RW (last 6 months or more).  Participated in OP PT first part of this year.  Was doing water  aerobics at [facility] (hasn't done  in last 5 months d/t stomach acting up).  PATIENT GOALS: Try to get strength back and do some of the things she was able to do before.  OBJECTIVE:  Note: Objective measures were completed at Evaluation unless otherwise noted.  DIAGNOSTIC FINDINGS:  MRI C-spine 09/26/24: - Multilevel spondylosis, disc bulging, reversal of normal cervical curvature and grade 2 anterior spondylolisthesis of C2 and C3 measuring 7 mm. - At C6-7 disc bulging and facet hypertrophy with moderate to severe spinal stenosis and severe bilateral foraminal stenosis; no cord signal abnormalities. - At C2-3 pseudo disc bulging and facet hypertrophy with moderate spinal stenosis and severe bilateral foraminal stenosis; no cord signal abnormalities. - At C3-4 disc bulging with mild spinal stenosis and severe bilateral foraminal stenosis; no cord signal abnormalities.  COGNITION: Overall cognitive status: Within functional limits for tasks assessed   SENSATION: Decreased light touch sensation L lateral thigh, medial lower leg, and entire foot  COORDINATION: Decreased alternating toe taps L LE compared to R LE  EDEMA: (chronic L>R LE per pt report) Circumferential: malleolar line 27.5 cm R ankle; 28 cm L ankle  POSTURE: rounded shoulders and forward head  LOWER EXTREMITY MMT:    MMT Right Eval Left Eval  Hip flexion 4+/5 4+/5  Hip extension    Hip abduction    Hip adduction    Hip internal rotation    Hip external rotation    Knee flexion    Knee extension 4+/5 4+/5  Ankle dorsiflexion 4+/5 2/5  Ankle plantarflexion At least 3/5 AROM At least 2/5 AROM  Ankle inversion    Ankle eversion    (Blank rows = not tested)  TRANSFERS: Sit to stand: Modified independence  Assistive device utilized: Environmental Consultant - 2 wheeled     Stand to sit: Modified independence  Assistive device utilized: Environmental Consultant - 2 wheeled      GAIT: Findings: Gait Characteristics: narrow BOS; R LE ER>L LE; foot flat R LE; decreased L LE DF with  forefoot initial WB'ing during initial contact L LE; increased L trunk lean; short steps, Distance walked: clinic distances, Assistive device utilized:Walker - 2 wheeled, Level of assistance: Modified independence, and Comments: decreased gait speed  FUNCTIONAL TESTS:  5 times sit to stand: 49.78 seconds with B UE support (up to RW) 10 meter walk test: 0.31 m/sec (31.63 seconds with RW use) BERG Balance Test: 22/56 Item T/est date: 11/07/24 Date:  Date:   Sitting to standing 3. able to stand independently using hands Insert SmartPhrase OPRCBERGREEVAL Insert SmartPhrase OPRCBERGREEVAL  2. Standing unsupported 3. able to stand 2 minutes with supervision    3. Sitting with back unsupported, feet supported 4. able to sit safely and securely for 2 minutes    4. Standing to sitting 3. controls descent by using hands    5. Pivot transfer  3. able to transfer safely with definite need of hands    6. Standing unsupported with eyes closed 3. able to stand 10 seconds with supervision    7. Standing unsupported with feet together 1. needs help to attain position but able to stand 15 seconds feet together    8. Reaching forward with outstretched arms while standing 1. reaches forward but needs supervision    9. Pick up object from the floor from standing 0. unable to try/needs assistance to keep from losing balance or falling; needs assist for balance    10. Turning to look behind over left and right shoulders while standing 1. needs supervision when turning    11. Turn 360 degrees 0. needs assistance while turning    12. Place alternate foot on step or stool while standing unsupported 0. needs assistance to keep from falling/unable to try; assist to keep balance with 2 attempts    13. Standing unsupported one foot in front 0. loses balance while stepping or standing    14. Standing on one leg 0. unable to try of needs assist to prevent fall ; need assist to maintain balance     Total Score 22/56 Total Score:     Total Score:      PATIENT SURVEYS:  TBA                                                                                                                             TREATMENT DATE: 11/21/24  Self Care: BP and HR taken in sitting at rest beginning of session (see below for details).  Pt asymptomatic.  Monitored for symptoms during session. Vitals:   11/21/24 1406  BP: 135/78  Pulse: (!) 50  HR up to 56 bpm with activity  Therapeutic Exercise: SciFit multi-peaks up to level 3 for 8 minutes using BUE/BLEs for global strengthening, dynamic cardiovascular warmup and increased amplitude of stepping. RPE of 5/10 following activity.  Average step length 6.7 inches.  Sitting LE ex's on mat table: x10 reps x3 sets B hip flexion with B 2# ankle weights X10 reps x3 sets B  LAQ's with B 2# ankle weights Step ups at stairs (onto 1st 6 inch step; keeping foot on step): x10 reps B LE's (with B UE support) Notes: limited d/t pt fatigue  PATIENT EDUCATION: Education details: Continue HEP.  Call PCP to discuss on/off L cheek pain. Person educated: Patient Education method: Explanation, Demonstration, Verbal cues, and Handouts Education comprehension: verbalized understanding and needs further education  HOME EXERCISE PROGRAM: Access Code: [email or web address] URL: [email or web address] Date: 11/07/2024 Prepared by: Damien Caulk  Exercises - Seated March  - 1 x daily - 5 x weekly - 1-2 sets - 10 reps - Seated Long Arc Quad  - 1 x daily - 5 x weekly - 1-2 sets - 10 reps - Seated Hip Abduction with Resistance  - 1 x daily - 5 x weekly - 1-2 sets - 10 reps - Seated Heel Raise  - 1 x daily - 5 x weekly - 1-2 sets - 10 reps - Seated Toe Raise  - 1 x daily - 5 x weekly - 1-2 sets - 10 reps  GOALS: Goals reviewed with patient? Yes  SHORT TERM GOALS: Target date: 11/28/2024  Pt will be independent with initial HEP in order to improve strength and balance in order to decrease fall risk and  improve function at home for ADL's. Baseline: TBA Goal status: INITIAL  2.  Assess BERG Balance Test and update LTG as needed. Baseline: 22/56 11/07/24 Goal status: MET  3.  Pt will decrease 5 Time Sit to Stand by at least 10 seconds in order to demonstrate clinically significant improvement in LE strength. Baseline: 49.78 seconds (Eval) Goal status: INITIAL   LONG TERM GOALS: Target date: 12/26/2024  Pt will be independent with HEP in order to improve strength and balance in order to decrease fall risk and improve function at home for ADL's. Baseline: TBA Goal status: INITIAL  2.  Patient will demonstrate an improved Berg Balance Score of 40 or greater as to demonstrate improved balance with ADLs and reduced fall risk. Baseline: 22/56 11/07/24 Goal status: REVISED  3.  Pt will decrease 5 Time Sit to Stand by at least 20 seconds in order to demonstrate clinically significant improvement in LE strength. Baseline: 49.78 seconds (Eval) Goal status: INITIAL  4.  Pt will increase by at least 0.13 m/s in order to demonstrate clinically significant improvement in community ambulation.  Baseline: 0.31 m/sec Goal status: INITIAL  5.  Pt will demonstrate improved L LE foot clearance for safety with gait. Baseline: Decreased L foot clearance with RW use Goal status: INITIAL  ASSESSMENT:  CLINICAL IMPRESSION: Patient was seen today for physical therapy treatment to address strength.  Focused session on global strengthening with focus on LE strengthening (including use of ankle weights).  Patient continues to be limited by strength and balance; also limited d/t LBP today.  They demonstrate good tolerance to therapy with activity modification and pacing.  They would continue to benefit from skilled PT to address impairments as noted and progress towards long term goals.  OBJECTIVE IMPAIRMENTS: Abnormal gait, cardiopulmonary status limiting activity, decreased activity tolerance,  decreased balance, decreased coordination, decreased endurance, decreased knowledge of condition, decreased knowledge of use of DME, decreased mobility, difficulty walking, decreased ROM, decreased strength, increased edema, impaired flexibility, impaired sensation, improper body mechanics, postural dysfunction, and pain.   ACTIVITY LIMITATIONS: carrying, lifting, bending, standing, squatting, stairs, transfers, bed mobility, continence, toileting, dressing, locomotion level, and caring for others  PARTICIPATION LIMITATIONS: meal prep, cleaning, laundry, shopping, community  activity, and yard work  PERSONAL FACTORS: Age, Fitness, Past/current experiences, and 1-2 comorbidities: COPD, HFpEF, bradycardia, and memory loss are also affecting patient's functional outcome.   REHAB POTENTIAL: Good  CLINICAL DECISION MAKING: Evolving/moderate complexity  EVALUATION COMPLEXITY: Moderate  PLAN:  PT FREQUENCY: 2x/week  PT DURATION: 8 weeks  PLANNED INTERVENTIONS: 97164- PT Re-evaluation, 97750- Physical Performance Testing, 97110-Therapeutic exercises, 97530- Therapeutic activity, [id number]- Neuromuscular re-education, 97535- Self Care, [id number]- Manual therapy, [id number]- Gait training, [phone]- Orthotic Initial, [phone]- Orthotic/Prosthetic subsequent, [phone]- Aquatic Therapy, [phone]- Electrical stimulation (manual), [id number]- Ultrasound, [id number]- Traction (mechanical), [phone] (1-2 muscles), [id number] (3+ muscles)- Dry Needling, Patient/Family education, Balance training, Stair training, Taping, Joint mobilization, Spinal mobilization, Compression bandaging, Cognitive remediation, DME instructions, Cryotherapy, Moist heat, and Biofeedback  PLAN FOR NEXT SESSION: Check on HEP; balance, strengthening, aerobic endurance; coordination.   Damien Caulk, PT 11/21/2024, 2:57 PM        "

## 2024-11-21 NOTE — Patient Instructions (Signed)
 Use forward resonance!!

## 2024-11-23 ENCOUNTER — Ambulatory Visit: Admitting: Physical Therapy

## 2024-11-23 ENCOUNTER — Encounter: Payer: Self-pay | Admitting: Physical Therapy

## 2024-11-23 VITALS — BP 117/71 | HR 61

## 2024-11-23 DIAGNOSIS — M542 Cervicalgia: Secondary | ICD-10-CM

## 2024-11-23 DIAGNOSIS — M6281 Muscle weakness (generalized): Secondary | ICD-10-CM

## 2024-11-23 DIAGNOSIS — R490 Dysphonia: Secondary | ICD-10-CM | POA: Diagnosis not present

## 2024-11-23 DIAGNOSIS — R2689 Other abnormalities of gait and mobility: Secondary | ICD-10-CM

## 2024-11-23 NOTE — Therapy (Signed)
 " OUTPATIENT PHYSICAL THERAPY NEURO TREATMENT   Patient Name: [name] MRN: [id number] DOB:[birth year], 83 y.o., female Today's Date: 11/23/2024  PCP: [name], MD REFERRING PROVIDER: Gregg Lek, MD  END OF SESSION:  PT End of Session - 11/23/24 1311     Visit Number 7    Number of Visits 17   16 plus Eval   Date for Recertification  01/09/25   for scheduling delays   Authorization Type [employer]    PT Start Time 1309    PT Stop Time 1355    PT Time Calculation (min) 46 min    Equipment Utilized During Treatment Gait belt    Activity Tolerance Patient tolerated treatment well   with pacing/rest breaks   Behavior During Therapy WFL for tasks assessed/performed          Past Medical History:  Diagnosis Date   Allergic rhinitis    Arnold-Chiari malformation (HCC)    Asthma    CAD (coronary artery disease)    COPD (chronic obstructive pulmonary disease) (HCC)    Coughing    coughing since last 01-22-2019 , started on cefdinir bid x10 days, has 3 pills left today , reports she feels mcuh better , still coughing copiuus amonts of thick white sputum , denies fever nor chills, nor body aches .    DJD (degenerative joint disease)    GERD (gastroesophageal reflux disease)    Hiatal hernia    History of absence seizures    last confirmed seizure age 8     Hx of colonic polyps    Hypercholesteremia    Hypertension    Obesity    Positive PPD    Reflex sympathetic dystrophy    Sleep apnea    Varicose veins    Venous insufficiency    Past Surgical History:  Procedure Laterality Date   ABDOMINAL HYSTERECTOMY     CHOLECYSTECTOMY N/A 02/19/2023   Procedure: LAPAROSCOPIC CHOLECYSTECTOMY WITH INTRAOPERATIVE CHOLANGIOGRAM AND ICG DYE;  Surgeon: Tanda Locus, MD;  Location: [facility] OR;  Service: General;  Laterality: N/A;   cspine surgery  12/02/1991   for arnold-chiari malformation   IR BONE MARROW BIOPSY & ASPIRATION  01/28/2023   JOINT REPLACEMENT  06/05/2011    Left total knee replacement   right knee arthroscopy  12/02/2007   Dr. Duwayne   right total knee replacement  05/31/2008   Dr. Duwayne   TONSILLECTOMY     TOTAL KNEE REVISION Left 02/03/2019   Procedure: LEFT TOTAL KNEE REVISION;  Surgeon: Fidel Rogue, MD;  Location: [facility] ORS;  Service: Orthopedics;  Laterality: Left;   Patient Active Problem List   Diagnosis Date Noted   Seasonal and perennial allergic rhinitis 07/18/2024   History of smoking 25-50 pack years 07/18/2024   Painful swallowing 07/18/2024   Abnormal CT scan 12/19/2022   Pain of upper abdomen 12/19/2022   Dilation of biliary tract 12/16/2022   Cellulitis of right leg 06/18/2022   Hyperglycemia 06/18/2022   Laceration of right index finger 05/08/2022   Acquired hallux valgus of left foot 11/22/2021   Primary localized osteoarthrosis of ankle and foot 11/22/2021   AKI (acute kidney injury) 03/13/2021   Memory loss 03/13/2021   Anxiety 03/13/2021   Hypokalemia 03/05/2021   Generalized weakness 03/04/2021   Dizziness 03/04/2021   Vitamin D  deficiency 01/16/2021   Cough 12/13/2020   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   Allergic rhinitis due to animal (cat) (dog) hair and dander 12/04/2020   Allergic rhinitis due to pollen  12/04/2020   Moderate persistent asthma, uncomplicated 12/04/2020   Radiculopathy, lumbar region 11/19/2020   Spondylolisthesis, lumbar region 11/19/2020   Leg cramping 11/06/2020   Degeneration of lumbar intervertebral disc 09/27/2020   Degenerative spondylolisthesis 08/15/2020   Pes anserinus bursitis of right knee 05/07/2020   Ulcer of toe, left, limited to breakdown of skin (HCC) 08/31/2019   Diastolic CHF, chronic (HCC) 08/31/2019   PVD (peripheral vascular disease) 08/31/2019   Bilateral carpal tunnel syndrome 08/19/2019   Posterior neck pain 08/19/2019   Vertigo 04/08/2019   Anemia 04/08/2019   Stiffness of left knee 02/10/2019   Failed total knee, left 02/03/2019   Failed total knee, left, initial  encounter 02/03/2019   Carpal tunnel syndrome of left wrist 01/21/2019   Pain of left hand 01/21/2019   Preop exam for internal medicine 11/02/2018   Left hand paresthesia 11/02/2018   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 11/01/2018   Hyperopia with presbyopia, bilateral 06/29/2018   Abdominal pain, lower 03/02/2018   Diarrhea 03/02/2018   Encounter for well adult exam with abnormal findings 11/14/2017   Arnold-Chiari malformation (HCC)    Asthma, well controlled    CAD (coronary artery disease)    COPD (chronic obstructive pulmonary disease) (HCC)    DJD (degenerative joint disease)    History of absence seizures    Hx of colonic polyps    Hypercholesteremia    Positive PPD    Venous insufficiency    Pain in left lower leg 07/20/2017   Cellulitis of left lower extremity 06/18/2017   Bilateral lower extremity edema 04/30/2017   Shingles 02/26/2017   COPD exacerbation (HCC) 01/07/2017   S/P TKR (total knee replacement), bilateral 01/07/2017   Right arm pain 05/16/2015   Generalized anxiety disorder 08/31/2014   Varicose veins of bilateral lower extremities with other complications 01/06/2012   Edema 08/07/2011   MENORRHAGIA, POSTMENOPAUSAL 08/13/2010   CIGARETTE SMOKER 06/11/2010   CARDIAC MURMUR 11/02/2009   Bradycardia 11/01/2009   CHEST PAIN 11/01/2009   Allergic rhinitis 04/19/2009   Chronic obstructive airway disease with asthma (HCC) 04/18/2009   LEG CRAMPS, NOCTURNAL 11/02/2008   Essential hypertension 01/17/2008   COLONIC POLYPS 01/14/2008   Body mass index (BMI) 36.0-36.9, adult 01/14/2008   Reflex sympathetic dystrophy 01/14/2008   Coronary atherosclerosis 01/14/2008   LPRD (laryngopharyngeal reflux disease) 01/14/2008   Osteoarthritis 01/14/2008   BACK PAIN, LUMBAR 01/14/2008   SEIZURES, HX OF 01/14/2008    ONSET DATE: 09/30/2024 (date of referral)  REFERRING DIAG: R53.1 (ICD-10-CM) - Weakness M54.2 (ICD-10-CM) - Cervicalgia  THERAPY DIAG:   Other abnormalities of gait and mobility  Muscle weakness (generalized)  Cervicalgia  Rationale for Evaluation and Treatment: Rehabilitation  SUBJECTIVE:  Likes to be called [name]  SUBJECTIVE STATEMENT: Pt reports stomach and low back hurting (7/10) today; is her typical pain.  No recent falls.  L cheek feeling alright today. Pt accompanied by: self  PERTINENT HISTORY: Pt with tremors and weakness (noted back in June; went to ED; MRI brain did not show acute abnormalities).  Difficulty eating and holding objects; difficulty writing; tremors worsen during the day.  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  Per PT referral: Cervicalgia. Left side weakness. 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  PAIN:  Are you having pain? Yes: NPRS scale: 7/10 Pain location: stomach and low back Pain description: sharp shooting; aching Aggravating factors: standing a lot Relieving factors: pain patches; medication cream  PRECAUTIONS: Fall; Swallowing difficulties (with pills and certain foods; not liquids/water )  RED FLAGS: None   WEIGHT BEARING RESTRICTIONS: No  FALLS: Has patient fallen in last 6 months? No  LIVING ENVIRONMENT: Lives with: lives with their family (grandson) Lives in: House/apartment Stairs: Yes: External: 5 steps; on right going up, on left going up, and can reach both Has following equipment at home: Single point cane and Walker - 2 wheeled  PLOF: Ambulates with RW (last 6 months or more).  Participated in OP PT first part of this  year.  Was doing water  aerobics at [facility] (hasn't done in last 5 months d/t stomach acting up).  PATIENT GOALS: Try to get strength back and do some of the things she was able to do before.  OBJECTIVE:  Note: Objective measures were completed at Evaluation unless otherwise noted.  DIAGNOSTIC FINDINGS:  MRI C-spine 09/26/24: - Multilevel spondylosis, disc bulging, reversal of normal cervical curvature and grade 2 anterior spondylolisthesis of C2 and C3 measuring 7 mm. - At C6-7 disc bulging and facet hypertrophy with moderate to severe spinal stenosis and severe bilateral foraminal stenosis; no cord signal abnormalities. - At C2-3 pseudo disc bulging and facet hypertrophy with moderate spinal stenosis and severe bilateral foraminal stenosis; no cord signal abnormalities. - At C3-4 disc bulging with mild spinal stenosis and severe bilateral foraminal stenosis; no cord signal abnormalities.  COGNITION: Overall cognitive status: Within functional limits for tasks assessed   SENSATION: Decreased light touch sensation L lateral thigh, medial lower leg, and entire foot  COORDINATION: Decreased alternating toe taps L LE compared to R LE  EDEMA: (chronic L>R LE per pt report) Circumferential: malleolar line 27.5 cm R ankle; 28 cm L ankle  POSTURE: rounded shoulders and forward head  LOWER EXTREMITY MMT:    MMT Right Eval Left Eval  Hip flexion 4+/5 4+/5  Hip extension    Hip abduction    Hip adduction    Hip internal rotation    Hip external rotation    Knee flexion    Knee extension 4+/5 4+/5  Ankle dorsiflexion 4+/5 2/5  Ankle plantarflexion At least 3/5 AROM At least 2/5 AROM  Ankle inversion    Ankle eversion    (Blank rows = not tested)  TRANSFERS: Sit to stand: Modified independence  Assistive device utilized: Environmental Consultant - 2 wheeled     Stand to sit: Modified independence  Assistive device utilized: Environmental Consultant - 2 wheeled      GAIT: Findings: Gait Characteristics: narrow BOS; R  LE ER>L LE; foot flat R  LE; decreased L LE DF with forefoot initial WB'ing during initial contact L LE; increased L trunk lean; short steps, Distance walked: clinic distances, Assistive device utilized:Walker - 2 wheeled, Level of assistance: Modified independence, and Comments: decreased gait speed  FUNCTIONAL TESTS:  5 times sit to stand: 49.78 seconds with B UE support (up to RW) 10 meter walk test: 0.31 m/sec (31.63 seconds with RW use) BERG Balance Test: 22/56 Item T/est date: 11/07/24 Date:  Date:   Sitting to standing 3. able to stand independently using hands Insert SmartPhrase OPRCBERGREEVAL Insert SmartPhrase OPRCBERGREEVAL  2. Standing unsupported 3. able to stand 2 minutes with supervision    3. Sitting with back unsupported, feet supported 4. able to sit safely and securely for 2 minutes    4. Standing to sitting 3. controls descent by using hands    5. Pivot transfer  3. able to transfer safely with definite need of hands    6. Standing unsupported with eyes closed 3. able to stand 10 seconds with supervision    7. Standing unsupported with feet together 1. needs help to attain position but able to stand 15 seconds feet together    8. Reaching forward with outstretched arms while standing 1. reaches forward but needs supervision    9. Pick up object from the floor from standing 0. unable to try/needs assistance to keep from losing balance or falling; needs assist for balance    10. Turning to look behind over left and right shoulders while standing 1. needs supervision when turning    11. Turn 360 degrees 0. needs assistance while turning    12. Place alternate foot on step or stool while standing unsupported 0. needs assistance to keep from falling/unable to try; assist to keep balance with 2 attempts    13. Standing unsupported one foot in front 0. loses balance while stepping or standing    14. Standing on one leg 0. unable to try of needs assist to prevent fall ; need assist to  maintain balance     Total Score 22/56 Total Score:    Total Score:      PATIENT SURVEYS:  TBA                                                                                                                             TREATMENT DATE: 11/23/24  Self Care: BP and HR taken in sitting at rest beginning of session (see below for details). Vitals:   11/23/24 1314  BP: 117/71  Pulse: 61    Therapeutic Exercise: SciFit multi-peaks up to level 4 for 8 minutes using BUE/BLEs for global strengthening, dynamic cardiovascular warmup and increased amplitude of stepping. RPE of 7/10 following activity.  Average stride length 7.4 inches.   Therapeutic Activity: Sit to stands (from mat table) on Airex: x5 reps x2 sets Notes: use of B UE's for transfers (no UE support once standing); 1st trial CGA except min assist  1 time d/t loss of balance backwards once standing; CGA 2nd trial Standing on Airex in // bars: x10 reps B UE pushouts (chest level) using percussion tube Notes: CGA to min assist for balance Toe taps to 2 inch step in // bars: x10 reps x2 sets B LE's Notes: unable to perform without UE support (progressed from B UE support to single UE support) Standing in // bars: Reaching forward/lateral R/lateral L within BOS with B UE's using percussion tubes (tapping in each position) to challenge balance Notes: CGA; pt cautious with movement  PATIENT EDUCATION: Education details: Continue HEP. Person educated: Patient Education method: Explanation, Demonstration, Verbal cues, and Handouts Education comprehension: verbalized understanding and needs further education  HOME EXERCISE PROGRAM: Access Code: [email or web address] URL: [email or web address] Date: 11/07/2024 Prepared by: Damien Caulk  Exercises - Seated March  - 1 x daily - 5 x weekly - 1-2 sets - 10 reps - Seated Long Arc Quad  - 1 x daily - 5 x weekly - 1-2 sets - 10 reps - Seated Hip Abduction with Resistance  - 1 x daily  - 5 x weekly - 1-2 sets - 10 reps - Seated Heel Raise  - 1 x daily - 5 x weekly - 1-2 sets - 10 reps - Seated Toe Raise  - 1 x daily - 5 x weekly - 1-2 sets - 10 reps  GOALS: Goals reviewed with patient? Yes  SHORT TERM GOALS: Target date: 11/28/2024  Pt will be independent with initial HEP in order to improve strength and balance in order to decrease fall risk and improve function at home for ADL's. Baseline: TBA Goal status: INITIAL  2.  Assess BERG Balance Test and update LTG as needed. Baseline: 22/56 11/07/24 Goal status: MET  3.  Pt will decrease 5 Time Sit to Stand by at least 10 seconds in order to demonstrate clinically significant improvement in LE strength. Baseline: 49.78 seconds (Eval) Goal status: INITIAL   LONG TERM GOALS: Target date: 12/26/2024  Pt will be independent with HEP in order to improve strength and balance in order to decrease fall risk and improve function at home for ADL's. Baseline: TBA Goal status: INITIAL  2.  Patient will demonstrate an improved Berg Balance Score of 40 or greater as to demonstrate improved balance with ADLs and reduced fall risk. Baseline: 22/56 11/07/24 Goal status: REVISED  3.  Pt will decrease 5 Time Sit to Stand by at least 20 seconds in order to demonstrate clinically significant improvement in LE strength. Baseline: 49.78 seconds (Eval) Goal status: INITIAL  4.  Pt will increase by at least 0.13 m/s in order to demonstrate clinically significant improvement in community ambulation.  Baseline: 0.31 m/sec Goal status: INITIAL  5.  Pt will demonstrate improved L LE foot clearance for safety with gait. Baseline: Decreased L foot clearance with RW use Goal status: INITIAL  ASSESSMENT:  CLINICAL IMPRESSION: Patient was seen today for physical therapy treatment to address strength and balance.  Focused session on global strengthening, increasing cardiovascular endurance, balance on compliant surface (with sit to  stands; UE reaching activity), and single leg stability (with toe taps to 2 inch step).  Patient continues to be limited by pain, strength, and balance.  They demonstrate improvement in balance reaction response (balance self correction) with repetition of balance activities.  Pt still requiring pacing and rest breaks but gradual improved activity tolerance in general noted.  They would continue to benefit from skilled PT to address impairments as  noted and progress towards long term goals.  OBJECTIVE IMPAIRMENTS: Abnormal gait, cardiopulmonary status limiting activity, decreased activity tolerance, decreased balance, decreased coordination, decreased endurance, decreased knowledge of condition, decreased knowledge of use of DME, decreased mobility, difficulty walking, decreased ROM, decreased strength, increased edema, impaired flexibility, impaired sensation, improper body mechanics, postural dysfunction, and pain.   ACTIVITY LIMITATIONS: carrying, lifting, bending, standing, squatting, stairs, transfers, bed mobility, continence, toileting, dressing, locomotion level, and caring for others  PARTICIPATION LIMITATIONS: meal prep, cleaning, laundry, shopping, community activity, and yard work  PERSONAL FACTORS: Age, Fitness, Past/current experiences, and 1-2 comorbidities: COPD, HFpEF, bradycardia, and memory loss are also affecting patient's functional outcome.   REHAB POTENTIAL: Good  CLINICAL DECISION MAKING: Evolving/moderate complexity  EVALUATION COMPLEXITY: Moderate  PLAN:  PT FREQUENCY: 2x/week  PT DURATION: 8 weeks  PLANNED INTERVENTIONS: [id number]- PT Re-evaluation, 97750- Physical Performance Testing, 97110-Therapeutic exercises, 97530- Therapeutic activity, [id number]- Neuromuscular re-education, 97535- Self Care, [id number]- Manual therapy, [id number]- Gait training, [phone]- Orthotic Initial, [phone]- Orthotic/Prosthetic subsequent, [phone]- Aquatic Therapy, [phone]- Electrical stimulation (manual), [id number]-  Ultrasound, [id number]- Traction (mechanical), [phone] (1-2 muscles), [id number] (3+ muscles)- Dry Needling, Patient/Family education, Balance training, Stair training, Taping, Joint mobilization, Spinal mobilization, Compression bandaging, Cognitive remediation, DME instructions, Cryotherapy, Moist heat, and Biofeedback  PLAN FOR NEXT SESSION: STG's due.  Check on HEP; balance, strengthening, aerobic endurance; coordination.   Damien Caulk, PT 11/23/2024, 2:13 PM        "

## 2024-11-28 ENCOUNTER — Ambulatory Visit

## 2024-11-28 ENCOUNTER — Encounter: Payer: Self-pay | Admitting: Physical Therapy

## 2024-11-28 ENCOUNTER — Ambulatory Visit: Admitting: Physical Therapy

## 2024-11-28 VITALS — BP 130/74 | HR 54

## 2024-11-28 DIAGNOSIS — M542 Cervicalgia: Secondary | ICD-10-CM

## 2024-11-28 DIAGNOSIS — R2689 Other abnormalities of gait and mobility: Secondary | ICD-10-CM

## 2024-11-28 DIAGNOSIS — M6281 Muscle weakness (generalized): Secondary | ICD-10-CM

## 2024-11-28 DIAGNOSIS — R490 Dysphonia: Secondary | ICD-10-CM

## 2024-11-28 NOTE — Therapy (Signed)
 " OUTPATIENT SPEECH LANGUAGE PATHOLOGY VOICE TREATMENT   Patient Name: [name] MRN: [id number] DOB:[birth year], 83 y.o., female Today's Date: 11/28/2024  PCP: [name], MD REFERRING PROVIDER: Mila Adah JONELLE, DO  END OF SESSION:  End of Session - 11/28/24 1316     Visit Number 5    Number of Visits 11    Date for Recertification  01/09/25    SLP Start Time 1232    SLP Stop Time  1316    SLP Time Calculation (min) 44 min    Activity Tolerance Patient tolerated treatment well              Past Medical History:  Diagnosis Date   Allergic rhinitis    Arnold-Chiari malformation (HCC)    Asthma    CAD (coronary artery disease)    COPD (chronic obstructive pulmonary disease) (HCC)    Coughing    coughing since last 01-22-2019 , started on cefdinir bid x10 days, has 3 pills left today , reports she feels mcuh better , still coughing copiuus amonts of thick white sputum , denies fever nor chills, nor body aches .    DJD (degenerative joint disease)    GERD (gastroesophageal reflux disease)    Hiatal hernia    History of absence seizures    last confirmed seizure age 78     Hx of colonic polyps    Hypercholesteremia    Hypertension    Obesity    Positive PPD    Reflex sympathetic dystrophy    Sleep apnea    Varicose veins    Venous insufficiency    Past Surgical History:  Procedure Laterality Date   ABDOMINAL HYSTERECTOMY     CHOLECYSTECTOMY N/A 02/19/2023   Procedure: LAPAROSCOPIC CHOLECYSTECTOMY WITH INTRAOPERATIVE CHOLANGIOGRAM AND ICG DYE;  Surgeon: Tanda Locus, MD;  Location: [facility] OR;  Service: General;  Laterality: N/A;   cspine surgery  12/02/1991   for arnold-chiari malformation   IR BONE MARROW BIOPSY & ASPIRATION  01/28/2023   JOINT REPLACEMENT  06/05/2011   Left total knee replacement   right knee arthroscopy  12/02/2007   Dr. Duwayne   right total knee replacement  05/31/2008   Dr. Duwayne   TONSILLECTOMY     TOTAL KNEE REVISION Left  02/03/2019   Procedure: LEFT TOTAL KNEE REVISION;  Surgeon: Fidel Rogue, MD;  Location: [facility] ORS;  Service: Orthopedics;  Laterality: Left;   Patient Active Problem List   Diagnosis Date Noted   Seasonal and perennial allergic rhinitis 07/18/2024   History of smoking 25-50 pack years 07/18/2024   Painful swallowing 07/18/2024   Abnormal CT scan 12/19/2022   Pain of upper abdomen 12/19/2022   Dilation of biliary tract 12/16/2022   Cellulitis of right leg 06/18/2022   Hyperglycemia 06/18/2022   Laceration of right index finger 05/08/2022   Acquired hallux valgus of left foot 11/22/2021   Primary localized osteoarthrosis of ankle and foot 11/22/2021   AKI (acute kidney injury) 03/13/2021   Memory loss 03/13/2021   Anxiety 03/13/2021   Hypokalemia 03/05/2021   Generalized weakness 03/04/2021   Dizziness 03/04/2021   Vitamin D  deficiency 01/16/2021   Cough 12/13/2020   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   Allergic rhinitis due to animal (cat) (dog) hair and dander 12/04/2020   Allergic rhinitis due to pollen 12/04/2020   Moderate persistent asthma, uncomplicated 12/04/2020   Radiculopathy, lumbar region 11/19/2020   Spondylolisthesis, lumbar region 11/19/2020   Leg cramping 11/06/2020   Degeneration of lumbar intervertebral disc  09/27/2020   Degenerative spondylolisthesis 08/15/2020   Pes anserinus bursitis of right knee 05/07/2020   Ulcer of toe, left, limited to breakdown of skin (HCC) 08/31/2019   Diastolic CHF, chronic (HCC) 08/31/2019   PVD (peripheral vascular disease) 08/31/2019   Bilateral carpal tunnel syndrome 08/19/2019   Posterior neck pain 08/19/2019   Vertigo 04/08/2019   Anemia 04/08/2019   Stiffness of left knee 02/10/2019   Failed total knee, left 02/03/2019   Failed total knee, left, initial encounter 02/03/2019   Carpal tunnel syndrome of left wrist 01/21/2019   Pain of left hand 01/21/2019   Preop exam for internal medicine 11/02/2018   Left hand paresthesia  11/02/2018   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 11/01/2018   Hyperopia with presbyopia, bilateral 06/29/2018   Abdominal pain, lower 03/02/2018   Diarrhea 03/02/2018   Encounter for well adult exam with abnormal findings 11/14/2017   Arnold-Chiari malformation (HCC)    Asthma, well controlled    CAD (coronary artery disease)    COPD (chronic obstructive pulmonary disease) (HCC)    DJD (degenerative joint disease)    History of absence seizures    Hx of colonic polyps    Hypercholesteremia    Positive PPD    Venous insufficiency    Pain in left lower leg 07/20/2017   Cellulitis of left lower extremity 06/18/2017   Bilateral lower extremity edema 04/30/2017   Shingles 02/26/2017   COPD exacerbation (HCC) 01/07/2017   S/P TKR (total knee replacement), bilateral 01/07/2017   Right arm pain 05/16/2015   Generalized anxiety disorder 08/31/2014   Varicose veins of bilateral lower extremities with other complications 01/06/2012   Edema 08/07/2011   MENORRHAGIA, POSTMENOPAUSAL 08/13/2010   CIGARETTE SMOKER 06/11/2010   CARDIAC MURMUR 11/02/2009   Bradycardia 11/01/2009   CHEST PAIN 11/01/2009   Allergic rhinitis 04/19/2009   Chronic obstructive airway disease with asthma (HCC) 04/18/2009   LEG CRAMPS, NOCTURNAL 11/02/2008   Essential hypertension 01/17/2008   COLONIC POLYPS 01/14/2008   Body mass index (BMI) 36.0-36.9, adult 01/14/2008   Reflex sympathetic dystrophy 01/14/2008   Coronary atherosclerosis 01/14/2008   LPRD (laryngopharyngeal reflux disease) 01/14/2008   Osteoarthritis 01/14/2008   BACK PAIN, LUMBAR 01/14/2008   SEIZURES, HX OF 01/14/2008    Onset date: Voice: ~3 months ago,                       Swallowing: Beginning of the year.  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 Pt accompanied by: self  PERTINENT HISTORY: (From 08/31/24 by Dr. Okey) [name] is an 83 year old female with a history of hiatal hernia on EGD 20 yrs ago, who presents with difficulty swallowing.   She has been experiencing difficulty swallowing for the past three months, describing it as a sensation of food not wanting to go down, particularly when taking medication. This occurs most of the time but not always. No recent illness preceded the onset of these symptoms.   A swallow study conducted in August was normal, but it only assessed the upper portion of her swallow. An MRI  of the brain was also normal, though there are some changes noted in the cervical spine.   She has a history of reflux and heartburn for which she is taking medication, and she reports that it helps. She has previously seen a GI doctor and underwent an upper endoscopy in 2005, which revealed a hiatal hernia.   She experiences hoarseness from time to time as well.  PAIN:  Are you having pain? Yes: Pain description: Back pain and arthritis pain.  FALLS: Has patient fallen in last 6 months? No, Number of falls: 0  LIVING ENVIRONMENT: Lives with: grandson Lives in: House/apartment  PLOF:Level of assistance: Independent with ADLs Employment: Retired  PATIENT GOALS: To improve voice and reduce perceived swallowing difficulties.  OBJECTIVE:  Note: Objective measures were completed at Evaluation unless otherwise noted.  DIAGNOSTIC FINDINGS: Flexible Fiberoptic Laryngoscopy Procedure Note (From 10/21/24) Pre-Op Diagnosis: hoarseness                                Post Op Diagnosis: same Procedure: Flexible Fiberoptic Laryngoscopy CPT 31575 - Mod 25 Surgeon: Adah Malkin, D.O. 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 It was passed along the floor of the nasal cavity to the nasopharynx. Torus tubarius was patent and the Fossa of Rosenmller was identified. The scope was then flexed caudally and advanced slowly through the nasopharynx, passed through the oropharynx, and down into the hypopharynx. The patient's oro- and nasopharynx were unremarkable with no signs of any gross lesions, edema, masses, or bleeding.  The base of tongue was visualized and no mass, ulceration or lesion was appreciated. The epiglottis did not demonstrate any mass, ulceration or lesion. The vallecula was also assessed with no mass, ulceration or lesion. The patient had good glottic closure upon phonation and no signs of aspiration or pooling of secretions. The patient was asked to inspire and expire with the true vocal folds vibrating normally and without evidence of vocal fold dysfunction. The right true vocal fold was noted to have a polypoid growth at the anterior 1/3 approaching the commissure. The true and false vocal cords, interarytenoid, AE folds, and arytenoids did not demonstrate any significant edema or erythema. The patient was then asked to valsalva, and the pyriform sinuses were assessed which were unremarkable. The airway was patent and there was no evidence of compromise. The scope was then slowly withdrawn from the patient. The patient tolerated the procedure well and there were no complications.  Disposition: Stable  COGNITION: Overall cognitive status: Within functional limits for tasks assessed   SOCIAL HISTORY: Occupation: Retired Water  intake: Five 12 oz/day. Caffeine/alcohol  intake: minimal and pt is currently drinking 1 soda/not consistently.  Daily voice use: moderate  PERCEPTUAL VOICE  ASSESSMENT: Voice quality: hoarse, breathy, rough, strained, and low vocal intensity Vocal abuse: habitual abnormal pitch Resonance: normal Respiratory function: thoracic breathing GRBAS: G-2, R-2, B-2, A-2, S-1  OBJECTIVE VOICE ASSESSMENT: Maximum phonation time for sustained ah: 2.8 seconds Conversational pitch average: 105 Hz Conversational pitch range:  90-130 Hz Conversational loudness average: 71 dB Conversational loudness range: 61-81 dB S/z ratio: 2.91 (  Suggestive of dysfunction >1.0)  PATIENT REPORTED OUTCOME MEASURES (PROM): V-RQOL: 33/50                                                                                                                            TREATMENT DATE:  11/28/24: Pt has been practicing her HEP every day since previous session. She notes that she sang in church on Sunday and felt that her voice felt fine. Pt states that her voice sounds a little better this week. SLP reviewed diaphragmatic breathing. Pt demonstrated diaphragmatic breathing in 80% of opportunities during structured voice exercises given occasional min A. SLP provided direct instruction and modeling for completion of resonant voice therapy exercises to encourage forward resonance and decrease tension when voicing. Pt demonstrated forward-resonance with 75% consistency during structured exercises at the word, phrase, and sentence levels with /m/ and /n/ sounds, provided with frequent min-A from SLP. Pt benefitting from cues to increase forward placement of the voice. SLP introduced Semi-Occluded Vocal Tract Exercises (SOVTEs) (Straw Phonation) as a strategy for increasing consistent airflow for voice production. After SLP guidance, pt successfully completed SOVTEs using a straw with and without water  given frequent min A for balanced phonation. Pt cleared her throat 4x during session. SLP educated pt about alternative strategies to decrease throat clearing for optimal vocal hygiene (water  sips, hard  swallow, shhhhh follwed by a deep breath). Pt is agreeable to trying alternative strategies for decreasing throat-clearing. SLP provided handout related to throat-clearing strategies for improving vocal hygiene. Plan is to continue resonant voice therapy and respiratory muscle strength training for decreasing laryngeal hyperfunction.  11/21/24: Pt has been practicing her HEP every day since previous session. Pt states that her voice sounds a little better this week. SLP reviewed diaphragmatic breathing. Pt demonstrated diaphragmatic breathing in 75% of opportunities during structured voice exercises given occasional min A. SLP provided direct instruction and modeling for completion of resonant voice therapy exercises to encourage forward resonance and decrease tension when voicing. Pt demonstrated forward-resonance with 70% consistency during structured exercises at the word, phrase, and sentence level with /m/ and /n/ sounds, provided with frequent min-A from SLP. Pt benefitting from cues to increase forward placement of the voice. Updated HEP to include /n/ words, phrases, and sentences for continued resonant voice work. Plan is to continue resonant voice therapy and respiratory muscle strength training for decreasing laryngeal hyperfunction.  11/16/24: Pt has been practicing her HEP every day since previous session. Pt is trying to drink 5-6 water  bottles, which is an improvement from prior session. Pt was 65% consistent with performing diaphragmatic breathing given frequent min A. SLP reviewed Resonant Voice Therapy (RVT) for maximizing forward resonance during voice production. During /b/ words, pt was 70% consistent with achieving forward resonance during voice production given frequent min A. SLP challenged pt to utilize forward resonance during /m/ words. Pt achieved clear voice in 65% of opportunities during /m/ word productions given frequent mod A. Suspect that pt's  breath management is a major  factor for reduced performance. SLP introduced Respiratory Muscle Strength Training (RMST) as a tool for improving expiratory muscle strength for voice production. SLP began guiding pt through using RMST device; pt displayed emerging understanding of device usage. Pt will need reinforcement in upcoming session for improving understanding and performance with device. Pt is making progress with voice goals. Plan is to continue RVT and RMST for optimizing voice production for QOL.  11/09/24: SLP had pt complete EAT-10 to assess self-perception of swallow ability. Pt notes that swallowing pills and solids is more difficult than liquids. SLP introduced strategies for vocal hygiene (increased water  intake, GERD lifestyle changes). Pt is agreeable to making adjustments to water  intake and utilizing lifestyle changes for GERD to optimize vocal health and wellness. SLP engaged pt in respiratory retraining, focusing on establishing diaphragmatic breath for balance phonation. After SLP guided demonstration, pt was 70% consistent with performing diaphragmatic breathing given frequent min A. SLP reintroduced Resonant Voice Therapy (RVT) for maximizing forward resonance during voice production. During /b/ words, pt was 75% consistent with achieving forward resonance during voice production given frequent min A. Pt is making progress with voice goals. Plan is to continue RVT and introduce vocal function exercises for reducing laryngeal hyperfunction.   10/31/24: Evaluation completed. SLP initiated tx on this date. SLP performed stimulability with pt to measure prognosis with voice therapy exercises. SLP introduced Resonant Voice Therapy (RVT) through /b/ syllables and words. Pt demonstrated ability to achieve clear voicing using RVT. SLP educated pt about basic physiology of the voice (respiration, phonation, articulation/resonance) for optimizing pt understanding of voice exercises. Pt demonstrated emerging understanding of  physiology of voice; will need reinforcement in future sessions. Plan is to continue RVT and introduced vocal function exercises (VFEs) for maximizing clear voicing for QOL.    PATIENT EDUCATION: Education details: Physiology of voice Person educated: Patient Education method: Explanation Education comprehension: verbalized understanding  HOME EXERCISE PROGRAM: TBD in upcoming sessions.  GOALS: Goals reviewed with patient? Yes  SHORT TERM GOALS: Target date: 12/03/24  Pt will utilize diaphragmatic breathing during structured exercises and conversation in 80% of opportunities given occasional min A. Baseline: Pt uses thoracic breathing pattern. Goal status: INITIAL  2.  Pt will achieve clear voicing during structured exercises and conversation in 80% of opportunities across 2 sessions given occasional min A. Baseline:  Goal status: INITIAL  3.  Pt will describe the basic physiology of the voice (Breathing, Phonation, Artic/Resonance) as related to voice therapy exercises with 90% consistency given rare min A.  Baseline:  Goal status: INITIAL  4.  Pt will describe at least 4 swallow precautions/exercises to given min A.  Baseline:  Goal status: INITIAL  5.  Pt will perform structured, sustained phonation exercises with clear voicing in 80% of opportunities when given occasional min A.  Baseline:  Goal status: INITIAL  6.  Pt will complete PROM for swallowing/reflux. Baseline:  Goal status: MET  LONG TERM GOALS: Target date: 01/09/25  Pt will achieve clear voicing during unstructured conversation in 85% of opportunities given rare min A.  Baseline: Pt voice is dysphonic.  Goal status: INITIAL  2.  Pt will report decreased score on PROM (V-RQOL), displaying self-perceived improvement of voice. Baseline: 33/50 Goal status: INITIAL  3.  Pt will describe and utilize swallow precautions to minimize self-reported swallowing difficulties.  Baseline:  Goal status:  INITIAL   ASSESSMENT:  CLINICAL IMPRESSION: Patient is a 83 y.o. female who was seen today  for a behavioral voice evaluation. Pt presents with a moderate dysphonia characterized by rough, breathy, and hoarse vocal quality and 33/50 on V-RQOL scale. According to PROM, pt frequently experiences depressive thoughts and avoids socializing in public due to dysphonia. Pt noted that her dysphonia has affected her ability to sing. Pt was a regular singer up until 3 months ago when her dysphonia started. In addition, pt reported trouble swallowing, specifically with pills. Pt had MBSS recently (07/08/24), with no significant findings. Pt does note hx of reflux and that she currently takes medication for reflux.   OBJECTIVE IMPAIRMENTS: include voice disorder. These impairments are limiting patient from effectively communicating at home and in community. Factors affecting potential to achieve goals and functional outcome are N/A. Patient will benefit from skilled SLP services to address above impairments and improve overall function.  REHAB POTENTIAL: Good  PLAN:  SLP FREQUENCY: 1x/week  SLP DURATION: 10 weeks  PLANNED INTERVENTIONS: Aspiration precaution training, SLP instruction and feedback, Compensatory strategies, and Patient/family education    [employer], CF-SLP 11/28/2024, 1:16 PM      "

## 2024-11-28 NOTE — Therapy (Signed)
 " OUTPATIENT PHYSICAL THERAPY NEURO TREATMENT   Patient Name: [name] MRN: [id number] DOB:[birth year], 83 y.o., female Today's Date: 11/28/2024  PCP: April Millman, MD REFERRING PROVIDER: Gregg Lek, MD  END OF SESSION:  PT End of Session - 11/28/24 1317     Visit Number 8    Number of Visits 17   16 plus Eval   Date for Recertification  01/09/25   for scheduling delays   Authorization Type [employer]    PT Start Time 1322   pt needing to use restroom beginning of session   PT Stop Time 1402    PT Time Calculation (min) 40 min    Equipment Utilized During Treatment Gait belt    Activity Tolerance Patient tolerated treatment well   with pacing/rest breaks   Behavior During Therapy WFL for tasks assessed/performed          Past Medical History:  Diagnosis Date   Allergic rhinitis    Arnold-Chiari malformation (HCC)    Asthma    CAD (coronary artery disease)    COPD (chronic obstructive pulmonary disease) (HCC)    Coughing    coughing since last 01-22-2019 , started on cefdinir bid x10 days, has 3 pills left today , reports she feels mcuh better , still coughing copiuus amonts of thick white sputum , denies fever nor chills, nor body aches .    DJD (degenerative joint disease)    GERD (gastroesophageal reflux disease)    Hiatal hernia    History of absence seizures    last confirmed seizure age 24     Hx of colonic polyps    Hypercholesteremia    Hypertension    Obesity    Positive PPD    Reflex sympathetic dystrophy    Sleep apnea    Varicose veins    Venous insufficiency    Past Surgical History:  Procedure Laterality Date   ABDOMINAL HYSTERECTOMY     CHOLECYSTECTOMY N/A 02/19/2023   Procedure: LAPAROSCOPIC CHOLECYSTECTOMY WITH INTRAOPERATIVE CHOLANGIOGRAM AND ICG DYE;  Surgeon: April Locus, MD;  Location: [facility] OR;  Service: General;  Laterality: N/A;   cspine surgery  12/02/1991   for arnold-chiari malformation   IR BONE MARROW BIOPSY & ASPIRATION   01/28/2023   JOINT REPLACEMENT  06/05/2011   Left total knee replacement   right knee arthroscopy  12/02/2007   Dr. Duwayne   right total knee replacement  05/31/2008   Dr. Duwayne   TONSILLECTOMY     TOTAL KNEE REVISION Left 02/03/2019   Procedure: LEFT TOTAL KNEE REVISION;  Surgeon: April Rogue, MD;  Location: [facility] ORS;  Service: Orthopedics;  Laterality: Left;   Patient Active Problem List   Diagnosis Date Noted   Seasonal and perennial allergic rhinitis 07/18/2024   History of smoking 25-50 pack years 07/18/2024   Painful swallowing 07/18/2024   Abnormal CT scan 12/19/2022   Pain of upper abdomen 12/19/2022   Dilation of biliary tract 12/16/2022   Cellulitis of right leg 06/18/2022   Hyperglycemia 06/18/2022   Laceration of right index finger 05/08/2022   Acquired hallux valgus of left foot 11/22/2021   Primary localized osteoarthrosis of ankle and foot 11/22/2021   AKI (acute kidney injury) 03/13/2021   Memory loss 03/13/2021   Anxiety 03/13/2021   Hypokalemia 03/05/2021   Generalized weakness 03/04/2021   Dizziness 03/04/2021   Vitamin D  deficiency 01/16/2021   Cough 12/13/2020   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   Allergic rhinitis due to animal (cat) (dog) hair and  dander 12/04/2020   Allergic rhinitis due to pollen 12/04/2020   Moderate persistent asthma, uncomplicated 12/04/2020   Radiculopathy, lumbar region 11/19/2020   Spondylolisthesis, lumbar region 11/19/2020   Leg cramping 11/06/2020   Degeneration of lumbar intervertebral disc 09/27/2020   Degenerative spondylolisthesis 08/15/2020   Pes anserinus bursitis of right knee 05/07/2020   Ulcer of toe, left, limited to breakdown of skin (HCC) 08/31/2019   Diastolic CHF, chronic (HCC) 08/31/2019   PVD (peripheral vascular disease) 08/31/2019   Bilateral carpal tunnel syndrome 08/19/2019   Posterior neck pain 08/19/2019   Vertigo 04/08/2019   Anemia 04/08/2019   Stiffness of left knee 02/10/2019   Failed total knee, left  02/03/2019   Failed total knee, left, initial encounter 02/03/2019   Carpal tunnel syndrome of left wrist 01/21/2019   Pain of left hand 01/21/2019   Preop exam for internal medicine 11/02/2018   Left hand paresthesia 11/02/2018   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 11/01/2018   Hyperopia with presbyopia, bilateral 06/29/2018   Abdominal pain, lower 03/02/2018   Diarrhea 03/02/2018   Encounter for well adult exam with abnormal findings 11/14/2017   Arnold-Chiari malformation (HCC)    Asthma, well controlled    CAD (coronary artery disease)    COPD (chronic obstructive pulmonary disease) (HCC)    DJD (degenerative joint disease)    History of absence seizures    Hx of colonic polyps    Hypercholesteremia    Positive PPD    Venous insufficiency    Pain in left lower leg 07/20/2017   Cellulitis of left lower extremity 06/18/2017   Bilateral lower extremity edema 04/30/2017   Shingles 02/26/2017   COPD exacerbation (HCC) 01/07/2017   S/P TKR (total knee replacement), bilateral 01/07/2017   Right arm pain 05/16/2015   Generalized anxiety disorder 08/31/2014   Varicose veins of bilateral lower extremities with other complications 01/06/2012   Edema 08/07/2011   MENORRHAGIA, POSTMENOPAUSAL 08/13/2010   CIGARETTE SMOKER 06/11/2010   CARDIAC MURMUR 11/02/2009   Bradycardia 11/01/2009   CHEST PAIN 11/01/2009   Allergic rhinitis 04/19/2009   Chronic obstructive airway disease with asthma (HCC) 04/18/2009   LEG CRAMPS, NOCTURNAL 11/02/2008   Essential hypertension 01/17/2008   COLONIC POLYPS 01/14/2008   Body mass index (BMI) 36.0-36.9, adult 01/14/2008   Reflex sympathetic dystrophy 01/14/2008   Coronary atherosclerosis 01/14/2008   LPRD (laryngopharyngeal reflux disease) 01/14/2008   Osteoarthritis 01/14/2008   BACK PAIN, LUMBAR 01/14/2008   SEIZURES, HX OF 01/14/2008    ONSET DATE: 09/30/2024 (date of referral)  REFERRING DIAG: R53.1 (ICD-10-CM) - Weakness  M54.2 (ICD-10-CM) - Cervicalgia  THERAPY DIAG:  Other abnormalities of gait and mobility  Muscle weakness (generalized)  Cervicalgia  Rationale for Evaluation and Treatment: Rehabilitation  SUBJECTIVE:  Likes to be called [name]  SUBJECTIVE STATEMENT: 7/10 LBP (chronic); took pain medication already.  No recent falls.  L cheek feeling better.  No acute changes. Pt accompanied by: self  PERTINENT HISTORY: Pt with tremors and weakness (noted back in June; went to ED; MRI brain did not show acute abnormalities).  Difficulty eating and holding objects; difficulty writing; tremors worsen during the day.  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  Per PT referral: Cervicalgia. Left side weakness. 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  PAIN:  Are you having pain? Yes: NPRS scale: 7/10 Pain location: low back Pain description: sharp shooting; aching Aggravating factors: standing a lot Relieving factors: pain patches; medication cream  PRECAUTIONS: Fall; Swallowing difficulties (with pills and certain foods; not liquids/water )  RED FLAGS: None   WEIGHT BEARING RESTRICTIONS: No  FALLS: Has patient fallen in last 6 months? No  LIVING ENVIRONMENT: Lives with: lives with their family (grandson) Lives in: House/apartment Stairs: Yes: External: 5 steps; on right going up, on left going up, and can reach both Has following equipment at home: Single point cane and Walker - 2 wheeled  PLOF: Ambulates with RW (last 6 months or more).  Participated in OP  PT first part of this year.  Was doing water  aerobics at [facility] (hasn't done in last 5 months d/t stomach acting up).  PATIENT GOALS: Try to get strength back and do some of the things she was able to do before.  OBJECTIVE:  Note: Objective measures were completed at Evaluation unless otherwise noted.  DIAGNOSTIC FINDINGS:  MRI C-spine 09/26/24: - Multilevel spondylosis, disc bulging, reversal of normal cervical curvature and grade 2 anterior spondylolisthesis of C2 and C3 measuring 7 mm. - At C6-7 disc bulging and facet hypertrophy with moderate to severe spinal stenosis and severe bilateral foraminal stenosis; no cord signal abnormalities. - At C2-3 pseudo disc bulging and facet hypertrophy with moderate spinal stenosis and severe bilateral foraminal stenosis; no cord signal abnormalities. - At C3-4 disc bulging with mild spinal stenosis and severe bilateral foraminal stenosis; no cord signal abnormalities.  COGNITION: Overall cognitive status: Within functional limits for tasks assessed   SENSATION: Decreased light touch sensation L lateral thigh, medial lower leg, and entire foot  COORDINATION: Decreased alternating toe taps L LE compared to R LE  EDEMA: (chronic L>R LE per pt report) Circumferential: malleolar line 27.5 cm R ankle; 28 cm L ankle  POSTURE: rounded shoulders and forward head  LOWER EXTREMITY MMT:    MMT Right Eval Left Eval  Hip flexion 4+/5 4+/5  Hip extension    Hip abduction    Hip adduction    Hip internal rotation    Hip external rotation    Knee flexion    Knee extension 4+/5 4+/5  Ankle dorsiflexion 4+/5 2/5  Ankle plantarflexion At least 3/5 AROM At least 2/5 AROM  Ankle inversion    Ankle eversion    (Blank rows = not tested)  TRANSFERS: Sit to stand: Modified independence  Assistive device utilized: Environmental Consultant - 2 wheeled     Stand to sit: Modified independence  Assistive device utilized: Environmental Consultant - 2 wheeled      GAIT: Findings: Gait  Characteristics: narrow BOS; R LE ER>L LE; foot flat R LE; decreased L LE DF  with forefoot initial WB'ing during initial contact L LE; increased L trunk lean; short steps, Distance walked: clinic distances, Assistive device utilized:Walker - 2 wheeled, Level of assistance: Modified independence, and Comments: decreased gait speed  FUNCTIONAL TESTS:  5 times sit to stand: 49.78 seconds with B UE support (up to RW); 35.47 seconds 11/28/24 10 meter walk test: 0.31 m/sec (31.63 seconds with RW use) BERG Balance Test: 22/56 Item T/est date: 11/07/24 Date:  Date:   Sitting to standing 3. able to stand independently using hands Insert SmartPhrase OPRCBERGREEVAL Insert SmartPhrase OPRCBERGREEVAL  2. Standing unsupported 3. able to stand 2 minutes with supervision    3. Sitting with back unsupported, feet supported 4. able to sit safely and securely for 2 minutes    4. Standing to sitting 3. controls descent by using hands    5. Pivot transfer  3. able to transfer safely with definite need of hands    6. Standing unsupported with eyes closed 3. able to stand 10 seconds with supervision    7. Standing unsupported with feet together 1. needs help to attain position but able to stand 15 seconds feet together    8. Reaching forward with outstretched arms while standing 1. reaches forward but needs supervision    9. Pick up object from the floor from standing 0. unable to try/needs assistance to keep from losing balance or falling; needs assist for balance    10. Turning to look behind over left and right shoulders while standing 1. needs supervision when turning    11. Turn 360 degrees 0. needs assistance while turning    12. Place alternate foot on step or stool while standing unsupported 0. needs assistance to keep from falling/unable to try; assist to keep balance with 2 attempts    13. Standing unsupported one foot in front 0. loses balance while stepping or standing    14. Standing on one leg 0. unable to  try of needs assist to prevent fall ; need assist to maintain balance     Total Score 22/56 Total Score:    Total Score:      PATIENT SURVEYS:  TBA                                                                                                                             TREATMENT DATE: 11/28/24  Self Care: BP and HR taken in sitting at rest beginning of session (see below for details). Vitals:   11/28/24 1324  BP: 130/74  Pulse: (!) 54     Therapeutic Exercise: SciFit multi-peaks up to level 4 for 8 minutes using BUE/BLEs for neural priming for global strengthening, dynamic cardiovascular warmup and increased amplitude of stepping. RPE of 5/10 following activity.  Average stride length 6.9 inches.  Pt reports limited d/t LBP and stomach discomfort (chronic symptoms).   Therapeutic Activity: 5 time sit to stand: 22.28 seconds 1st trial (pt keeping B hands on armrests entire  test without letting go); 35.47 seconds 2nd trial (good technique noted) Toe taps to 2 inch step (standing by mat table): x10 reps x2 sets B LE's Notes: progressing from single UE support to no UE support; min assist progressing to CGA for balance Toe taps to 4 inch step (standing by mat table): x5 reps x1 set B LE's Notes: CGA to min assist; pt catching either foot on step occasionally  PATIENT EDUCATION: Education details: Continue HEP. Person educated: Patient Education method: Explanation, Demonstration, Verbal cues, and Handouts Education comprehension: verbalized understanding and needs further education  HOME EXERCISE PROGRAM: Access Code: [email or web address] URL: [email or web address] Date: 11/07/2024 Prepared by: Damien Caulk  Exercises - Seated March  - 1 x daily - 5 x weekly - 1-2 sets - 10 reps - Seated Long Arc Quad  - 1 x daily - 5 x weekly - 1-2 sets - 10 reps - Seated Hip Abduction with Resistance  - 1 x daily - 5 x weekly - 1-2 sets - 10 reps - Seated Heel Raise  - 1 x daily - 5  x weekly - 1-2 sets - 10 reps - Seated Toe Raise  - 1 x daily - 5 x weekly - 1-2 sets - 10 reps  GOALS: Goals reviewed with patient? Yes  SHORT TERM GOALS: Target date: 11/28/2024  Pt will be independent with initial HEP in order to improve strength and balance in order to decrease fall risk and improve function at home for ADL's. Baseline: TBA Goal status: MET  2.  Assess BERG Balance Test and update LTG as needed. Baseline: 22/56 11/07/24 Goal status: MET  3.  Pt will decrease 5 Time Sit to Stand by at least 10 seconds in order to demonstrate clinically significant improvement in LE strength. Baseline: 49.78 seconds (Eval); 35.47 seconds 11/28/24 Goal status: MET   LONG TERM GOALS: Target date: 12/26/2024  Pt will be independent with HEP in order to improve strength and balance in order to decrease fall risk and improve function at home for ADL's. Baseline: TBA Goal status: INITIAL  2.  Patient will demonstrate an improved Berg Balance Score of 40 or greater as to demonstrate improved balance with ADLs and reduced fall risk. Baseline: 22/56 11/07/24 Goal status: REVISED  3.  Pt will decrease 5 Time Sit to Stand by at least 20 seconds in order to demonstrate clinically significant improvement in LE strength. Baseline: 49.78 seconds (Eval); 35.47 seconds 11/28/24 Goal status: INITIAL  4.  Pt will increase by at least 0.13 m/s in order to demonstrate clinically significant improvement in community ambulation.  Baseline: 0.31 m/sec Goal status: INITIAL  5.  Pt will demonstrate improved L LE foot clearance for safety with gait. Baseline: Decreased L foot clearance with RW use Goal status: INITIAL  ASSESSMENT:  CLINICAL IMPRESSION: Patient was seen today for physical therapy treatment to address STG's, strength, and balance.  Focused session on assessing STG's (including 5 time sit to stand), global strengthening, and balance.  Pt scored 35.47 seconds on the 5 time sit to  stand test indicating pt is at increased risk of falls (>15 seconds = increased risk of falls); improved from 49.78 seconds on Eval.  Pt met 3/3 STG's.  Patient continues to be limited by strength and balance.  They demonstrate improvement in standing balance (with toe taps to 2 inch and then 4 inch step) compared to last session (progressing to no UE support).  They would continue to benefit from skilled PT to address  impairments as noted and progress towards long term goals.  OBJECTIVE IMPAIRMENTS: Abnormal gait, cardiopulmonary status limiting activity, decreased activity tolerance, decreased balance, decreased coordination, decreased endurance, decreased knowledge of condition, decreased knowledge of use of DME, decreased mobility, difficulty walking, decreased ROM, decreased strength, increased edema, impaired flexibility, impaired sensation, improper body mechanics, postural dysfunction, and pain.   ACTIVITY LIMITATIONS: carrying, lifting, bending, standing, squatting, stairs, transfers, bed mobility, continence, toileting, dressing, locomotion level, and caring for others  PARTICIPATION LIMITATIONS: meal prep, cleaning, laundry, shopping, community activity, and yard work  PERSONAL FACTORS: Age, Fitness, Past/current experiences, and 1-2 comorbidities: COPD, HFpEF, bradycardia, and memory loss are also affecting patient's functional outcome.   REHAB POTENTIAL: Good  CLINICAL DECISION MAKING: Evolving/moderate complexity  EVALUATION COMPLEXITY: Moderate  PLAN:  PT FREQUENCY: 2x/week  PT DURATION: 8 weeks  PLANNED INTERVENTIONS: 97164- PT Re-evaluation, 97750- Physical Performance Testing, 97110-Therapeutic exercises, 97530- Therapeutic activity, [id number]- Neuromuscular re-education, 97535- Self Care, [id number]- Manual therapy, [id number]- Gait training, [phone]- Orthotic Initial, [phone]- Orthotic/Prosthetic subsequent, [phone]- Aquatic Therapy, [phone]- Electrical stimulation (manual), [id number]- Ultrasound,  [id number]- Traction (mechanical), [phone] (1-2 muscles), [phone] (3+ muscles)- Dry Needling, Patient/Family education, Balance training, Stair training, Taping, Joint mobilization, Spinal mobilization, Compression bandaging, Cognitive remediation, DME instructions, Cryotherapy, Moist heat, and Biofeedback  PLAN FOR NEXT SESSION: Check on HEP; balance, strengthening, aerobic endurance; coordination.   Damien Caulk, PT 11/28/2024, 4:27 PM        "

## 2024-11-30 ENCOUNTER — Other Ambulatory Visit (HOSPITAL_COMMUNITY): Payer: Self-pay | Admitting: Family Medicine

## 2024-11-30 ENCOUNTER — Ambulatory Visit (HOSPITAL_COMMUNITY)
Admission: RE | Admit: 2024-11-30 | Discharge: 2024-11-30 | Disposition: A | Discharge status: Home / Self Care | Source: Ambulatory Visit | Attending: Vascular Surgery | Admitting: Vascular Surgery

## 2024-11-30 ENCOUNTER — Ambulatory Visit: Admitting: Physical Therapy

## 2024-11-30 ENCOUNTER — Encounter: Payer: Self-pay | Admitting: Physical Therapy

## 2024-11-30 VITALS — BP 135/80 | HR 50

## 2024-11-30 DIAGNOSIS — M79605 Pain in left leg: Secondary | ICD-10-CM | POA: Diagnosis not present

## 2024-11-30 DIAGNOSIS — M6281 Muscle weakness (generalized): Secondary | ICD-10-CM

## 2024-11-30 DIAGNOSIS — M542 Cervicalgia: Secondary | ICD-10-CM

## 2024-11-30 DIAGNOSIS — R2689 Other abnormalities of gait and mobility: Secondary | ICD-10-CM

## 2024-11-30 NOTE — Therapy (Signed)
 " OUTPATIENT PHYSICAL THERAPY NEURO TREATMENT--ARRIVED-NO CHARGE   Patient Name: [name] MRN: [id number] DOB:[birth year], 83 y.o., female Today's Date: 11/30/2024  PCP: [name], MD REFERRING PROVIDER: Gregg Lek, MD  END OF SESSION:  PT End of Session - 11/30/24 1136     Visit Number 8   ARRIVED NO CHARGE   Number of Visits 17   16 plus Eval   Date for Recertification  01/09/25   for scheduling delays   Authorization Type [employer]    PT Start Time 1103    PT Stop Time 1123    PT Time Calculation (min) 20 min    Activity Tolerance Other (comment)   Deferred treatment d/t Acute B LE swelling and pain   Behavior During Therapy WFL for tasks assessed/performed           Past Medical History:  Diagnosis Date   Allergic rhinitis    Arnold-Chiari malformation (HCC)    Asthma    CAD (coronary artery disease)    COPD (chronic obstructive pulmonary disease) (HCC)    Coughing    coughing since last 01-22-2019 , started on cefdinir bid x10 days, has 3 pills left today , reports she feels mcuh better , still coughing copiuus amonts of thick white sputum , denies fever nor chills, nor body aches .    DJD (degenerative joint disease)    GERD (gastroesophageal reflux disease)    Hiatal hernia    History of absence seizures    last confirmed seizure age 89     Hx of colonic polyps    Hypercholesteremia    Hypertension    Obesity    Positive PPD    Reflex sympathetic dystrophy    Sleep apnea    Varicose veins    Venous insufficiency    Past Surgical History:  Procedure Laterality Date   ABDOMINAL HYSTERECTOMY     CHOLECYSTECTOMY N/A 02/19/2023   Procedure: LAPAROSCOPIC CHOLECYSTECTOMY WITH INTRAOPERATIVE CHOLANGIOGRAM AND ICG DYE;  Surgeon: Tanda Locus, MD;  Location: [facility] OR;  Service: General;  Laterality: N/A;   cspine surgery  12/02/1991   for arnold-chiari malformation   IR BONE MARROW BIOPSY & ASPIRATION  01/28/2023   JOINT REPLACEMENT  06/05/2011    Left total knee replacement   right knee arthroscopy  12/02/2007   Dr. Duwayne   right total knee replacement  05/31/2008   Dr. Duwayne   TONSILLECTOMY     TOTAL KNEE REVISION Left 02/03/2019   Procedure: LEFT TOTAL KNEE REVISION;  Surgeon: Fidel Rogue, MD;  Location: [facility] ORS;  Service: Orthopedics;  Laterality: Left;   Patient Active Problem List   Diagnosis Date Noted   Seasonal and perennial allergic rhinitis 07/18/2024   History of smoking 25-50 pack years 07/18/2024   Painful swallowing 07/18/2024   Abnormal CT scan 12/19/2022   Pain of upper abdomen 12/19/2022   Dilation of biliary tract 12/16/2022   Cellulitis of right leg 06/18/2022   Hyperglycemia 06/18/2022   Laceration of right index finger 05/08/2022   Acquired hallux valgus of left foot 11/22/2021   Primary localized osteoarthrosis of ankle and foot 11/22/2021   AKI (acute kidney injury) 03/13/2021   Memory loss 03/13/2021   Anxiety 03/13/2021   Hypokalemia 03/05/2021   Generalized weakness 03/04/2021   Dizziness 03/04/2021   Vitamin D  deficiency 01/16/2021   Cough 12/13/2020   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   Allergic rhinitis due to animal (cat) (dog) hair and dander 12/04/2020   Allergic rhinitis due to  pollen 12/04/2020   Moderate persistent asthma, uncomplicated 12/04/2020   Radiculopathy, lumbar region 11/19/2020   Spondylolisthesis, lumbar region 11/19/2020   Leg cramping 11/06/2020   Degeneration of lumbar intervertebral disc 09/27/2020   Degenerative spondylolisthesis 08/15/2020   Pes anserinus bursitis of right knee 05/07/2020   Ulcer of toe, left, limited to breakdown of skin (HCC) 08/31/2019   Diastolic CHF, chronic (HCC) 08/31/2019   PVD (peripheral vascular disease) 08/31/2019   Bilateral carpal tunnel syndrome 08/19/2019   Posterior neck pain 08/19/2019   Vertigo 04/08/2019   Anemia 04/08/2019   Stiffness of left knee 02/10/2019   Failed total knee, left 02/03/2019   Failed total knee, left, initial  encounter 02/03/2019   Carpal tunnel syndrome of left wrist 01/21/2019   Pain of left hand 01/21/2019   Preop exam for internal medicine 11/02/2018   Left hand paresthesia 11/02/2018   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 11/01/2018   Hyperopia with presbyopia, bilateral 06/29/2018   Abdominal pain, lower 03/02/2018   Diarrhea 03/02/2018   Encounter for well adult exam with abnormal findings 11/14/2017   Arnold-Chiari malformation (HCC)    Asthma, well controlled    CAD (coronary artery disease)    COPD (chronic obstructive pulmonary disease) (HCC)    DJD (degenerative joint disease)    History of absence seizures    Hx of colonic polyps    Hypercholesteremia    Positive PPD    Venous insufficiency    Pain in left lower leg 07/20/2017   Cellulitis of left lower extremity 06/18/2017   Bilateral lower extremity edema 04/30/2017   Shingles 02/26/2017   COPD exacerbation (HCC) 01/07/2017   S/P TKR (total knee replacement), bilateral 01/07/2017   Right arm pain 05/16/2015   Generalized anxiety disorder 08/31/2014   Varicose veins of bilateral lower extremities with other complications 01/06/2012   Edema 08/07/2011   MENORRHAGIA, POSTMENOPAUSAL 08/13/2010   CIGARETTE SMOKER 06/11/2010   CARDIAC MURMUR 11/02/2009   Bradycardia 11/01/2009   CHEST PAIN 11/01/2009   Allergic rhinitis 04/19/2009   Chronic obstructive airway disease with asthma (HCC) 04/18/2009   LEG CRAMPS, NOCTURNAL 11/02/2008   Essential hypertension 01/17/2008   COLONIC POLYPS 01/14/2008   Body mass index (BMI) 36.0-36.9, adult 01/14/2008   Reflex sympathetic dystrophy 01/14/2008   Coronary atherosclerosis 01/14/2008   LPRD (laryngopharyngeal reflux disease) 01/14/2008   Osteoarthritis 01/14/2008   BACK PAIN, LUMBAR 01/14/2008   SEIZURES, HX OF 01/14/2008    ONSET DATE: 09/30/2024 (date of referral)  REFERRING DIAG: R53.1 (ICD-10-CM) - Weakness M54.2 (ICD-10-CM) - Cervicalgia  THERAPY DIAG:   Other abnormalities of gait and mobility  Muscle weakness (generalized)  Cervicalgia  Rationale for Evaluation and Treatment: Rehabilitation  SUBJECTIVE:  Likes to be called [name]  SUBJECTIVE STATEMENT: Pt reporting B LE swelling and pain that started yesterday; B LE's feeling heavy; pt called PCP this morning already and has appt at 12:30pm today to address concerns.  R>L LE swelling noted; B calf warmth and pain noted (pain with and without touch). Pt accompanied by: self  PERTINENT HISTORY: Pt with tremors and weakness (noted back in June; went to ED; MRI brain did not show acute abnormalities).  Difficulty eating and holding objects; difficulty writing; tremors worsen during the day.  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  Per PT referral: Cervicalgia. Left side weakness. 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  PAIN: Significant B calf tenderness noted Are you having pain? Yes: NPRS scale: 7/10 Pain location: low back Pain description: sharp shooting; aching Aggravating factors: standing a lot Relieving factors: pain patches; medication cream  PRECAUTIONS: Fall; Swallowing difficulties (with pills and certain foods; not liquids/water )  RED FLAGS: None   WEIGHT BEARING RESTRICTIONS: No  FALLS: Has patient fallen in last 6 months? No  LIVING ENVIRONMENT: Lives with: lives with their family (grandson) Lives in: House/apartment Stairs: Yes: External: 5 steps; on right going up, on left going up, and can reach both Has following  equipment at home: Single point cane and Walker - 2 wheeled  PLOF: Ambulates with RW (last 6 months or more).  Participated in OP PT first part of this year.  Was doing water  aerobics at [facility] (hasn't done in last 5 months d/t stomach acting up).  PATIENT GOALS: Try to get strength back and do some of the things she was able to do before.  OBJECTIVE:  Note: Objective measures were completed at Evaluation unless otherwise noted.  DIAGNOSTIC FINDINGS:  MRI C-spine 09/26/24: - Multilevel spondylosis, disc bulging, reversal of normal cervical curvature and grade 2 anterior spondylolisthesis of C2 and C3 measuring 7 mm. - At C6-7 disc bulging and facet hypertrophy with moderate to severe spinal stenosis and severe bilateral foraminal stenosis; no cord signal abnormalities. - At C2-3 pseudo disc bulging and facet hypertrophy with moderate spinal stenosis and severe bilateral foraminal stenosis; no cord signal abnormalities. - At C3-4 disc bulging with mild spinal stenosis and severe bilateral foraminal stenosis; no cord signal abnormalities.  COGNITION: Overall cognitive status: Within functional limits for tasks assessed   SENSATION: Decreased light touch sensation L lateral thigh, medial lower leg, and entire foot  COORDINATION: Decreased alternating toe taps L LE compared to R LE  EDEMA: (chronic L>R LE per pt report) Circumferential: malleolar line 27.5 cm R ankle; 28 cm L ankle  POSTURE: rounded shoulders and forward head  LOWER EXTREMITY MMT:    MMT Right Eval Left Eval  Hip flexion 4+/5 4+/5  Hip extension    Hip abduction    Hip adduction    Hip internal rotation    Hip external rotation    Knee flexion    Knee extension 4+/5 4+/5  Ankle dorsiflexion 4+/5 2/5  Ankle plantarflexion At least 3/5 AROM At least 2/5 AROM  Ankle inversion    Ankle eversion    (Blank rows = not tested)  TRANSFERS: Sit to stand: Modified independence  Assistive device utilized: [employer]ant - 2  wheeled     Stand to sit: Modified independence  Assistive  device utilized: Environmental Consultant - 2 wheeled      GAIT: Findings: Gait Characteristics: narrow BOS; R LE ER>L LE; foot flat R LE; decreased L LE DF with forefoot initial WB'ing during initial contact L LE; increased L trunk lean; short steps, Distance walked: clinic distances, Assistive device utilized:Walker - 2 wheeled, Level of assistance: Modified independence, and Comments: decreased gait speed  FUNCTIONAL TESTS:  5 times sit to stand: 49.78 seconds with B UE support (up to RW); 35.47 seconds 11/28/24 10 meter walk test: 0.31 m/sec (31.63 seconds with RW use) BERG Balance Test: 22/56 Item T/est date: 11/07/24 Date:  Date:   Sitting to standing 3. able to stand independently using hands Insert SmartPhrase OPRCBERGREEVAL Insert SmartPhrase OPRCBERGREEVAL  2. Standing unsupported 3. able to stand 2 minutes with supervision    3. Sitting with back unsupported, feet supported 4. able to sit safely and securely for 2 minutes    4. Standing to sitting 3. controls descent by using hands    5. Pivot transfer  3. able to transfer safely with definite need of hands    6. Standing unsupported with eyes closed 3. able to stand 10 seconds with supervision    7. Standing unsupported with feet together 1. needs help to attain position but able to stand 15 seconds feet together    8. Reaching forward with outstretched arms while standing 1. reaches forward but needs supervision    9. Pick up object from the floor from standing 0. unable to try/needs assistance to keep from losing balance or falling; needs assist for balance    10. Turning to look behind over left and right shoulders while standing 1. needs supervision when turning    11. Turn 360 degrees 0. needs assistance while turning    12. Place alternate foot on step or stool while standing unsupported 0. needs assistance to keep from falling/unable to try; assist to keep balance with 2 attempts     13. Standing unsupported one foot in front 0. loses balance while stepping or standing    14. Standing on one leg 0. unable to try of needs assist to prevent fall ; need assist to maintain balance     Total Score 22/56 Total Score:    Total Score:      PATIENT SURVEYS:  TBA                                                                                                                             TREATMENT DATE: 11/30/24  Self Care: BP and HR taken in sitting at rest beginning of session (see below for details). Vitals:   11/30/24 1110  BP: 135/80  Pulse: (!) 50    PATIENT EDUCATION: Education details: Pt has PCP appt today at 12:30 pm to address B LE swelling and pain concerns. Person educated: Patient Education method: Explanation and Verbal cues Education comprehension: verbalized understanding and needs further education  HOME EXERCISE PROGRAM:  Access Code: [email or web address] URL: [email or web address] Date: 11/07/2024 Prepared by: Damien Caulk  Exercises - Seated March  - 1 x daily - 5 x weekly - 1-2 sets - 10 reps - Seated Long Arc Quad  - 1 x daily - 5 x weekly - 1-2 sets - 10 reps - Seated Hip Abduction with Resistance  - 1 x daily - 5 x weekly - 1-2 sets - 10 reps - Seated Heel Raise  - 1 x daily - 5 x weekly - 1-2 sets - 10 reps - Seated Toe Raise  - 1 x daily - 5 x weekly - 1-2 sets - 10 reps  GOALS: Goals reviewed with patient? Yes  SHORT TERM GOALS: Target date: 11/28/2024  Pt will be independent with initial HEP in order to improve strength and balance in order to decrease fall risk and improve function at home for ADL's. Baseline: TBA Goal status: MET  2.  Assess BERG Balance Test and update LTG as needed. Baseline: 22/56 11/07/24 Goal status: MET  3.  Pt will decrease 5 Time Sit to Stand by at least 10 seconds in order to demonstrate clinically significant improvement in LE strength. Baseline: 49.78 seconds (Eval); 35.47 seconds  11/28/24 Goal status: MET   LONG TERM GOALS: Target date: 12/26/2024  Pt will be independent with HEP in order to improve strength and balance in order to decrease fall risk and improve function at home for ADL's. Baseline: TBA Goal status: INITIAL  2.  Patient will demonstrate an improved Berg Balance Score of 40 or greater as to demonstrate improved balance with ADLs and reduced fall risk. Baseline: 22/56 11/07/24 Goal status: REVISED  3.  Pt will decrease 5 Time Sit to Stand by at least 20 seconds in order to demonstrate clinically significant improvement in LE strength. Baseline: 49.78 seconds (Eval); 35.47 seconds 11/28/24 Goal status: INITIAL  4.  Pt will increase by at least 0.13 m/s in order to demonstrate clinically significant improvement in community ambulation.  Baseline: 0.31 m/sec Goal status: INITIAL  5.  Pt will demonstrate improved L LE foot clearance for safety with gait. Baseline: Decreased L foot clearance with RW use Goal status: INITIAL  ASSESSMENT:  CLINICAL IMPRESSION: Arrived-no charge visit.  Pt reporting B LE swelling that started yesterday (R>L LE swelling noted) with pain to B calf's.  Pt's vitals taken (BP 135/80 with HR 50 bpm).  D/t above noted acute symptoms, deferred therapy session (until seen by PCP) d/t medical concerns.  Pt reports having 12:30 pm appt today with PCP to address concerns.  Next PT session planned for Monday 12/05/24.  OBJECTIVE IMPAIRMENTS: Abnormal gait, cardiopulmonary status limiting activity, decreased activity tolerance, decreased balance, decreased coordination, decreased endurance, decreased knowledge of condition, decreased knowledge of use of DME, decreased mobility, difficulty walking, decreased ROM, decreased strength, increased edema, impaired flexibility, impaired sensation, improper body mechanics, postural dysfunction, and pain.   ACTIVITY LIMITATIONS: carrying, lifting, bending, standing, squatting, stairs,  transfers, bed mobility, continence, toileting, dressing, locomotion level, and caring for others  PARTICIPATION LIMITATIONS: meal prep, cleaning, laundry, shopping, community activity, and yard work  PERSONAL FACTORS: Age, Fitness, Past/current experiences, and 1-2 comorbidities: COPD, HFpEF, bradycardia, and memory loss are also affecting patient's functional outcome.   REHAB POTENTIAL: Good  CLINICAL DECISION MAKING: Evolving/moderate complexity  EVALUATION COMPLEXITY: Moderate  PLAN:  PT FREQUENCY: 2x/week  PT DURATION: 8 weeks  PLANNED INTERVENTIONS: [id number]- PT Re-evaluation, 97750- Physical Performance Testing, 97110-Therapeutic exercises, 97530- Therapeutic activity, [id number]- Neuromuscular re-education, 97535-  Self Care, 02859- Manual therapy, [id number]- Gait training, [phone]- Orthotic Initial, [id number]- Orthotic/Prosthetic subsequent, [id number]- Aquatic Therapy, [phone]- Electrical stimulation (manual), [id number]- Ultrasound, [id number]- Traction (mechanical), [phone] (1-2 muscles), [id number] (3+ muscles)- Dry Needling, Patient/Family education, Balance training, Stair training, Taping, Joint mobilization, Spinal mobilization, Compression bandaging, Cognitive remediation, DME instructions, Cryotherapy, Moist heat, and Biofeedback  PLAN FOR NEXT SESSION: Check on PCP appt 11/30/24 d/t B LE swelling and pain.  Check on HEP; balance, strengthening, aerobic endurance; coordination.   Damien Caulk, PT 11/30/2024, 11:38 AM        "

## 2024-12-05 ENCOUNTER — Ambulatory Visit: Attending: Neurology | Admitting: Physical Therapy

## 2024-12-05 ENCOUNTER — Ambulatory Visit

## 2024-12-05 ENCOUNTER — Encounter: Payer: Self-pay | Admitting: Physical Therapy

## 2024-12-05 VITALS — BP 119/77 | HR 50

## 2024-12-05 DIAGNOSIS — R490 Dysphonia: Secondary | ICD-10-CM

## 2024-12-05 DIAGNOSIS — M6281 Muscle weakness (generalized): Secondary | ICD-10-CM | POA: Insufficient documentation

## 2024-12-05 DIAGNOSIS — R2689 Other abnormalities of gait and mobility: Secondary | ICD-10-CM | POA: Insufficient documentation

## 2024-12-05 NOTE — Therapy (Signed)
 " OUTPATIENT PHYSICAL THERAPY NEURO TREATMENT   Patient Name: [name] MRN: [id number] DOB:11-09-1941, 84 y.o., female Today's Date: 12/05/2024  PCP: [name], MD REFERRING PROVIDER: Gregg Lek, MD  END OF SESSION:  PT End of Session - 12/05/24 1109     Visit Number 9    Number of Visits 17   16 plus Eval   Date for Recertification  01/09/25   for scheduling delays   Authorization Type [employer]    PT Start Time 1106   therapist running late   PT Stop Time 1151    PT Time Calculation (min) 45 min    Equipment Utilized During Treatment Gait belt    Activity Tolerance Patient tolerated treatment well   with pacing/rest breaks   Behavior During Therapy WFL for tasks assessed/performed          Past Medical History:  Diagnosis Date   Allergic rhinitis    Arnold-Chiari malformation (HCC)    Asthma    CAD (coronary artery disease)    COPD (chronic obstructive pulmonary disease) (HCC)    Coughing    coughing since last 01-22-2019 , started on cefdinir bid x10 days, has 3 pills left today , reports she feels mcuh better , still coughing copiuus amonts of thick white sputum , denies fever nor chills, nor body aches .    DJD (degenerative joint disease)    GERD (gastroesophageal reflux disease)    Hiatal hernia    History of absence seizures    last confirmed seizure age 29     Hx of colonic polyps    Hypercholesteremia    Hypertension    Obesity    Positive PPD    Reflex sympathetic dystrophy    Sleep apnea    Varicose veins    Venous insufficiency    Past Surgical History:  Procedure Laterality Date   ABDOMINAL HYSTERECTOMY     CHOLECYSTECTOMY N/A 02/19/2023   Procedure: LAPAROSCOPIC CHOLECYSTECTOMY WITH INTRAOPERATIVE CHOLANGIOGRAM AND ICG DYE;  Surgeon: Tanda Locus, MD;  Location: [facility] OR;  Service: General;  Laterality: N/A;   cspine surgery  12/02/1991   for arnold-chiari malformation   IR BONE MARROW BIOPSY & ASPIRATION  01/28/2023   JOINT  REPLACEMENT  06/05/2011   Left total knee replacement   right knee arthroscopy  12/02/2007   Dr. Duwayne   right total knee replacement  05/31/2008   Dr. Duwayne   TONSILLECTOMY     TOTAL KNEE REVISION Left 02/03/2019   Procedure: LEFT TOTAL KNEE REVISION;  Surgeon: Fidel Rogue, MD;  Location: [facility] ORS;  Service: Orthopedics;  Laterality: Left;   Patient Active Problem List   Diagnosis Date Noted   Seasonal and perennial allergic rhinitis 07/18/2024   History of smoking 25-50 pack years 07/18/2024   Painful swallowing 07/18/2024   Abnormal CT scan 12/19/2022   Pain of upper abdomen 12/19/2022   Dilation of biliary tract 12/16/2022   Cellulitis of right leg 06/18/2022   Hyperglycemia 06/18/2022   Laceration of right index finger 05/08/2022   Acquired hallux valgus of left foot 11/22/2021   Primary localized osteoarthrosis of ankle and foot 11/22/2021   AKI (acute kidney injury) 03/13/2021   Memory loss 03/13/2021   Anxiety 03/13/2021   Hypokalemia 03/05/2021   Generalized weakness 03/04/2021   Dizziness 03/04/2021   Vitamin D  deficiency 01/16/2021   Cough 12/13/2020   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   Allergic rhinitis due to animal (cat) (dog) hair and dander 12/04/2020   Allergic  rhinitis due to pollen 12/04/2020   Moderate persistent asthma, uncomplicated 12/04/2020   Radiculopathy, lumbar region 11/19/2020   Spondylolisthesis, lumbar region 11/19/2020   Leg cramping 11/06/2020   Degeneration of lumbar intervertebral disc 09/27/2020   Degenerative spondylolisthesis 08/15/2020   Pes anserinus bursitis of right knee 05/07/2020   Ulcer of toe, left, limited to breakdown of skin (HCC) 08/31/2019   Diastolic CHF, chronic (HCC) 08/31/2019   PVD (peripheral vascular disease) 08/31/2019   Bilateral carpal tunnel syndrome 08/19/2019   Posterior neck pain 08/19/2019   Vertigo 04/08/2019   Anemia 04/08/2019   Stiffness of left knee 02/10/2019   Failed total knee, left 02/03/2019   Failed  total knee, left, initial encounter 02/03/2019   Carpal tunnel syndrome of left wrist 01/21/2019   Pain of left hand 01/21/2019   Preop exam for internal medicine 11/02/2018   Left hand paresthesia 11/02/2018   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 11/01/2018   Hyperopia with presbyopia, bilateral 06/29/2018   Abdominal pain, lower 03/02/2018   Diarrhea 03/02/2018   Encounter for well adult exam with abnormal findings 11/14/2017   Arnold-Chiari malformation (HCC)    Asthma, well controlled    CAD (coronary artery disease)    COPD (chronic obstructive pulmonary disease) (HCC)    DJD (degenerative joint disease)    History of absence seizures    Hx of colonic polyps    Hypercholesteremia    Positive PPD    Venous insufficiency    Pain in left lower leg 07/20/2017   Cellulitis of left lower extremity 06/18/2017   Bilateral lower extremity edema 04/30/2017   Shingles 02/26/2017   COPD exacerbation (HCC) 01/07/2017   S/P TKR (total knee replacement), bilateral 01/07/2017   Right arm pain 05/16/2015   Generalized anxiety disorder 08/31/2014   Varicose veins of bilateral lower extremities with other complications 01/06/2012   Edema 08/07/2011   MENORRHAGIA, POSTMENOPAUSAL 08/13/2010   CIGARETTE SMOKER 06/11/2010   CARDIAC MURMUR 11/02/2009   Bradycardia 11/01/2009   CHEST PAIN 11/01/2009   Allergic rhinitis 04/19/2009   Chronic obstructive airway disease with asthma (HCC) 04/18/2009   LEG CRAMPS, NOCTURNAL 11/02/2008   Essential hypertension 01/17/2008   COLONIC POLYPS 01/14/2008   Body mass index (BMI) 36.0-36.9, adult 01/14/2008   Reflex sympathetic dystrophy 01/14/2008   Coronary atherosclerosis 01/14/2008   LPRD (laryngopharyngeal reflux disease) 01/14/2008   Osteoarthritis 01/14/2008   BACK PAIN, LUMBAR 01/14/2008   SEIZURES, HX OF 01/14/2008    ONSET DATE: 09/30/2024 (date of referral)  REFERRING DIAG: R53.1 (ICD-10-CM) - Weakness M54.2 (ICD-10-CM) -  Cervicalgia  THERAPY DIAG:  Other abnormalities of gait and mobility  Muscle weakness (generalized)  Rationale for Evaluation and Treatment: Rehabilitation  SUBJECTIVE:  Likes to be called [name]  SUBJECTIVE STATEMENT: Pt reports she saw the MD and had US  of LE's and no DVT found.  No medication changes made by PCP per pt report.  Had LBP and LE cramps last night but better after pain patch and taking medication.  No recent falls.  Swelling in LE's better. Pt accompanied by: self  PERTINENT HISTORY: Pt with tremors and weakness (noted back in June; went to ED; MRI brain did not show acute abnormalities).  Difficulty eating and holding objects; difficulty writing; tremors worsen during the day.  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  Per PT referral: Cervicalgia. Left side weakness. 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  PAIN:  Are you having pain? Yes: NPRS scale: 7/10 Pain location: low back Pain description: sharp shooting; aching Aggravating factors: standing a lot Relieving factors: pain patches; medication cream  PRECAUTIONS: Fall; Swallowing difficulties (with pills and certain foods; not liquids/water )  RED FLAGS: None   WEIGHT BEARING RESTRICTIONS: No  FALLS: Has patient fallen in last 6 months? No  LIVING ENVIRONMENT: Lives with: lives with their family (grandson) Lives in: House/apartment Stairs: Yes: External: 5 steps; on right going up, on left going up, and can reach both Has following equipment at home: Single point cane  and Walker - 2 wheeled  PLOF: Ambulates with RW (last 6 months or more).  Participated in OP PT first part of this year.  Was doing water  aerobics at [facility] (hasn't done in last 5 months d/t stomach acting up).  PATIENT GOALS: Try to get strength back and do some of the things she was able to do before.  OBJECTIVE:  Note: Objective measures were completed at Evaluation unless otherwise noted.  DIAGNOSTIC FINDINGS:  MRI C-spine 09/26/24: - Multilevel spondylosis, disc bulging, reversal of normal cervical curvature and grade 2 anterior spondylolisthesis of C2 and C3 measuring 7 mm. - At C6-7 disc bulging and facet hypertrophy with moderate to severe spinal stenosis and severe bilateral foraminal stenosis; no cord signal abnormalities. - At C2-3 pseudo disc bulging and facet hypertrophy with moderate spinal stenosis and severe bilateral foraminal stenosis; no cord signal abnormalities. - At C3-4 disc bulging with mild spinal stenosis and severe bilateral foraminal stenosis; no cord signal abnormalities.  COGNITION: Overall cognitive status: Within functional limits for tasks assessed   SENSATION: Decreased light touch sensation L lateral thigh, medial lower leg, and entire foot  COORDINATION: Decreased alternating toe taps L LE compared to R LE  EDEMA: (chronic L>R LE per pt report) Circumferential: malleolar line 27.5 cm R ankle; 28 cm L ankle  POSTURE: rounded shoulders and forward head  LOWER EXTREMITY MMT:    MMT Right Eval Left Eval  Hip flexion 4+/5 4+/5  Hip extension    Hip abduction    Hip adduction    Hip internal rotation    Hip external rotation    Knee flexion    Knee extension 4+/5 4+/5  Ankle dorsiflexion 4+/5 2/5  Ankle plantarflexion At least 3/5 AROM At least 2/5 AROM  Ankle inversion    Ankle eversion    (Blank rows = not tested)  TRANSFERS: Sit to stand: Modified independence  Assistive device utilized: [employer] Consultant - 2 wheeled     Stand to sit: Modified  independence  Assistive device utilized: Environmental Consultant - 2 wheeled      GAIT: Findings: Gait Characteristics: narrow BOS; R LE ER>L LE; foot flat R LE; decreased L LE DF with forefoot initial WB'ing during initial contact L LE; increased L trunk lean; short steps, Distance walked: clinic distances, Assistive device utilized:Walker - 2 wheeled, Level of assistance: Modified independence, and Comments: decreased gait speed  FUNCTIONAL TESTS:  5 times sit to stand: 49.78 seconds with B UE support (up to RW); 35.47 seconds 11/28/24 10 meter walk test: 0.31 m/sec (31.63 seconds with RW use) BERG Balance Test: 22/56 Item T/est date: 11/07/24 Date:  Date:   Sitting to standing 3. able to stand independently using hands Insert SmartPhrase OPRCBERGREEVAL Insert SmartPhrase OPRCBERGREEVAL  2. Standing unsupported 3. able to stand 2 minutes with supervision    3. Sitting with back unsupported, feet supported 4. able to sit safely and securely for 2 minutes    4. Standing to sitting 3. controls descent by using hands    5. Pivot transfer  3. able to transfer safely with definite need of hands    6. Standing unsupported with eyes closed 3. able to stand 10 seconds with supervision    7. Standing unsupported with feet together 1. needs help to attain position but able to stand 15 seconds feet together    8. Reaching forward with outstretched arms while standing 1. reaches forward but needs supervision    9. Pick up object from the floor from standing 0. unable to try/needs assistance to keep from losing balance or falling; needs assist for balance    10. Turning to look behind over left and right shoulders while standing 1. needs supervision when turning    11. Turn 360 degrees 0. needs assistance while turning    12. Place alternate foot on step or stool while standing unsupported 0. needs assistance to keep from falling/unable to try; assist to keep balance with 2 attempts    13. Standing unsupported one foot in  front 0. loses balance while stepping or standing    14. Standing on one leg 0. unable to try of needs assist to prevent fall ; need assist to maintain balance     Total Score 22/56 Total Score:    Total Score:      PATIENT SURVEYS:  TBA                                                                                                                             TREATMENT DATE: 12/05/2024  Self Care: BP and HR taken in sitting at rest beginning of session (see below for details). Vitals:   12/05/24 1111  BP: 119/77  Pulse: (!) 50  HR 52 bpm end of session at rest.  Therapeutic Exercise: SciFit multi-peaks up to level 4 for 8 minutes using BUE/BLEs for global strengthening, dynamic cardiovascular warmup and increased amplitude of stepping. RPE of 8/10 following activity.  Average stride length  6.6 inches.  Therapeutic Activity: Standing activities in // bars: Toe taps (no UE support) to improve single leg balance/stability: x10 reps B LE's to 4 inch step  x10 reps x2 sets B LE's to 6 inch step Notes: improved confidence noted with repetition; pt requiring sitting rest breaks between sets d/t fatigue Step ups (keeping foot on step) to improve single leg balance/stability: x10 reps B LE's to 6 inch step (B UE support required) x10 reps B LE's to 4 inch step (single UE support required) Notes: pt requiring sitting rest breaks between sets d/t fatigue  PATIENT EDUCATION: Education details: Continue HEP. Person educated: Patient Education method: Explanation, Demonstration, Verbal cues, and Handouts Education comprehension: verbalized understanding and needs further education  HOME EXERCISE PROGRAM: Access Code: [email or web address] URL: [email or web address] Date: 11/07/2024 Prepared by: Damien Caulk  Exercises - Seated March  - 1 x daily - 5 x weekly - 1-2 sets - 10 reps - Seated Long Arc Quad  - 1 x daily - 5 x weekly - 1-2 sets - 10 reps - Seated Hip Abduction with  Resistance  - 1 x daily - 5 x weekly - 1-2 sets - 10 reps - Seated Heel Raise  - 1 x daily - 5 x weekly - 1-2 sets - 10 reps - Seated Toe Raise  - 1 x daily - 5 x weekly - 1-2 sets - 10 reps  GOALS: Goals reviewed with patient? Yes  SHORT TERM GOALS: Target date: 11/28/2024  Pt will be independent with initial HEP in order to improve strength and balance in order to decrease fall risk and improve function at home for ADL's. Baseline: TBA Goal status: MET  2.  Assess BERG Balance Test and update LTG as needed. Baseline: 22/56 11/07/24 Goal status: MET  3.  Pt will decrease 5 Time Sit to Stand by at least 10 seconds in order to demonstrate clinically significant improvement in LE strength. Baseline: 49.78 seconds (Eval); 35.47 seconds 11/28/24 Goal status: MET   LONG TERM GOALS: Target date: 12/26/2024  Pt will be independent with HEP in order to improve strength and balance in order to decrease fall risk and improve function at home for ADL's. Baseline: TBA Goal status: INITIAL  2.  Patient will demonstrate an improved Berg Balance Score of 40 or greater as to demonstrate improved balance with ADLs and reduced fall risk. Baseline: 22/56 11/07/24 Goal status: REVISED  3.  Pt will decrease 5 Time Sit to Stand by at least 20 seconds in order to demonstrate clinically significant improvement in LE strength. Baseline: 49.78 seconds (Eval); 35.47 seconds 11/28/24 Goal status: INITIAL  4.  Pt will increase by at least 0.13 m/s in order to demonstrate clinically significant improvement in community ambulation.  Baseline: 0.31 m/sec Goal status: INITIAL  5.  Pt will demonstrate improved L LE foot clearance for safety with gait. Baseline: Decreased L foot clearance with RW use Goal status: INITIAL  ASSESSMENT:  CLINICAL IMPRESSION: Patient was seen today for physical therapy treatment to address strength and balance.  Focused session on global strengthening and single leg  balance (toe taps and step ups on 4 inch and 6 inch step with various UE support for balance).  Patient continues to be limited by strength, balance, and endurance (pt requiring sitting rest breaks between activities).  They demonstrate improvement in single leg balance progressing to 6 inch step toe taps (no UE support) today.  They would continue to benefit from skilled PT to address impairments  as noted and progress towards long term goals.  OBJECTIVE IMPAIRMENTS: Abnormal gait, cardiopulmonary status limiting activity, decreased activity tolerance, decreased balance, decreased coordination, decreased endurance, decreased knowledge of condition, decreased knowledge of use of DME, decreased mobility, difficulty walking, decreased ROM, decreased strength, increased edema, impaired flexibility, impaired sensation, improper body mechanics, postural dysfunction, and pain.   ACTIVITY LIMITATIONS: carrying, lifting, bending, standing, squatting, stairs, transfers, bed mobility, continence, toileting, dressing, locomotion level, and caring for others  PARTICIPATION LIMITATIONS: meal prep, cleaning, laundry, shopping, community activity, and yard work  PERSONAL FACTORS: Age, Fitness, Past/current experiences, and 1-2 comorbidities: COPD, HFpEF, bradycardia, and memory loss are also affecting patient's functional outcome.   REHAB POTENTIAL: Good  CLINICAL DECISION MAKING: Evolving/moderate complexity  EVALUATION COMPLEXITY: Moderate  PLAN:  PT FREQUENCY: 2x/week  PT DURATION: 8 weeks  PLANNED INTERVENTIONS: [id number]- PT Re-evaluation, 97750- Physical Performance Testing, 97110-Therapeutic exercises, 97530- Therapeutic activity, [id number]- Neuromuscular re-education, 97535- Self Care, [id number]- Manual therapy, [id number]- Gait training, [phone]- Orthotic Initial, [phone]- Orthotic/Prosthetic subsequent, [phone]- Aquatic Therapy, [phone]- Electrical stimulation (manual), [id number]- Ultrasound, [id number]- Traction (mechanical), [phone]  (1-2 muscles), [id number] (3+ muscles)- Dry Needling, Patient/Family education, Balance training, Stair training, Taping, Joint mobilization, Spinal mobilization, Compression bandaging, Cognitive remediation, DME instructions, Cryotherapy, Moist heat, and Biofeedback  PLAN FOR NEXT SESSION: Check on HEP; balance, strengthening, aerobic endurance; coordination.   Damien Caulk, PT 12/05/2024, 12:09 PM        "

## 2024-12-05 NOTE — Patient Instructions (Signed)
 Expiratory Muscle Strength training -   Perform EMST with device at least 15x for 2-3x per day. Adjust when needed.

## 2024-12-05 NOTE — Therapy (Signed)
 " OUTPATIENT SPEECH LANGUAGE PATHOLOGY VOICE TREATMENT   Patient Name: [name] MRN: [id number] DOB:January 13, 1941, 84 y.o., female Today's Date: 12/05/2024  PCP: April Millman, MD REFERRING PROVIDER: Mila Adah JONELLE, DO  END OF SESSION:  End of Session - 12/05/24 1101     Visit Number 6    Number of Visits 11    Date for Recertification  01/09/25    SLP Start Time 1020    SLP Stop Time  1101    SLP Time Calculation (min) 41 min    Activity Tolerance Patient tolerated treatment well               Past Medical History:  Diagnosis Date   Allergic rhinitis    Arnold-Chiari malformation (HCC)    Asthma    CAD (coronary artery disease)    COPD (chronic obstructive pulmonary disease) (HCC)    Coughing    coughing since last 01-22-2019 , started on cefdinir bid x10 days, has 3 pills left today , reports she feels mcuh better , still coughing copiuus amonts of thick white sputum , denies fever nor chills, nor body aches .    DJD (degenerative joint disease)    GERD (gastroesophageal reflux disease)    Hiatal hernia    History of absence seizures    last confirmed seizure age 77     Hx of colonic polyps    Hypercholesteremia    Hypertension    Obesity    Positive PPD    Reflex sympathetic dystrophy    Sleep apnea    Varicose veins    Venous insufficiency    Past Surgical History:  Procedure Laterality Date   ABDOMINAL HYSTERECTOMY     CHOLECYSTECTOMY N/A 02/19/2023   Procedure: LAPAROSCOPIC CHOLECYSTECTOMY WITH INTRAOPERATIVE CHOLANGIOGRAM AND ICG DYE;  Surgeon: Tanda Locus, MD;  Location: [facility] OR;  Service: General;  Laterality: N/A;   cspine surgery  12/02/1991   for arnold-chiari malformation   IR BONE MARROW BIOPSY & ASPIRATION  01/28/2023   JOINT REPLACEMENT  06/05/2011   Left total knee replacement   right knee arthroscopy  12/02/2007   Dr. Duwayne   right total knee replacement  05/31/2008   Dr. Duwayne   TONSILLECTOMY     TOTAL KNEE REVISION Left  02/03/2019   Procedure: LEFT TOTAL KNEE REVISION;  Surgeon: Fidel Rogue, MD;  Location: [facility] ORS;  Service: Orthopedics;  Laterality: Left;   Patient Active Problem List   Diagnosis Date Noted   Seasonal and perennial allergic rhinitis 07/18/2024   History of smoking 25-50 pack years 07/18/2024   Painful swallowing 07/18/2024   Abnormal CT scan 12/19/2022   Pain of upper abdomen 12/19/2022   Dilation of biliary tract 12/16/2022   Cellulitis of right leg 06/18/2022   Hyperglycemia 06/18/2022   Laceration of right index finger 05/08/2022   Acquired hallux valgus of left foot 11/22/2021   Primary localized osteoarthrosis of ankle and foot 11/22/2021   AKI (acute kidney injury) 03/13/2021   Memory loss 03/13/2021   Anxiety 03/13/2021   Hypokalemia 03/05/2021   Generalized weakness 03/04/2021   Dizziness 03/04/2021   Vitamin D  deficiency 01/16/2021   Cough 12/13/2020   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   Allergic rhinitis due to animal (cat) (dog) hair and dander 12/04/2020   Allergic rhinitis due to pollen 12/04/2020   Moderate persistent asthma, uncomplicated 12/04/2020   Radiculopathy, lumbar region 11/19/2020   Spondylolisthesis, lumbar region 11/19/2020   Leg cramping 11/06/2020   Degeneration of lumbar intervertebral  disc 09/27/2020   Degenerative spondylolisthesis 08/15/2020   Pes anserinus bursitis of right knee 05/07/2020   Ulcer of toe, left, limited to breakdown of skin (HCC) 08/31/2019   Diastolic CHF, chronic (HCC) 08/31/2019   PVD (peripheral vascular disease) 08/31/2019   Bilateral carpal tunnel syndrome 08/19/2019   Posterior neck pain 08/19/2019   Vertigo 04/08/2019   Anemia 04/08/2019   Stiffness of left knee 02/10/2019   Failed total knee, left 02/03/2019   Failed total knee, left, initial encounter 02/03/2019   Carpal tunnel syndrome of left wrist 01/21/2019   Pain of left hand 01/21/2019   Preop exam for internal medicine 11/02/2018   Left hand paresthesia  11/02/2018   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 11/01/2018   Hyperopia with presbyopia, bilateral 06/29/2018   Abdominal pain, lower 03/02/2018   Diarrhea 03/02/2018   Encounter for well adult exam with abnormal findings 11/14/2017   Arnold-Chiari malformation (HCC)    Asthma, well controlled    CAD (coronary artery disease)    COPD (chronic obstructive pulmonary disease) (HCC)    DJD (degenerative joint disease)    History of absence seizures    Hx of colonic polyps    Hypercholesteremia    Positive PPD    Venous insufficiency    Pain in left lower leg 07/20/2017   Cellulitis of left lower extremity 06/18/2017   Bilateral lower extremity edema 04/30/2017   Shingles 02/26/2017   COPD exacerbation (HCC) 01/07/2017   S/P TKR (total knee replacement), bilateral 01/07/2017   Right arm pain 05/16/2015   Generalized anxiety disorder 08/31/2014   Varicose veins of bilateral lower extremities with other complications 01/06/2012   Edema 08/07/2011   MENORRHAGIA, POSTMENOPAUSAL 08/13/2010   CIGARETTE SMOKER 06/11/2010   CARDIAC MURMUR 11/02/2009   Bradycardia 11/01/2009   CHEST PAIN 11/01/2009   Allergic rhinitis 04/19/2009   Chronic obstructive airway disease with asthma (HCC) 04/18/2009   LEG CRAMPS, NOCTURNAL 11/02/2008   Essential hypertension 01/17/2008   COLONIC POLYPS 01/14/2008   Body mass index (BMI) 36.0-36.9, adult 01/14/2008   Reflex sympathetic dystrophy 01/14/2008   Coronary atherosclerosis 01/14/2008   LPRD (laryngopharyngeal reflux disease) 01/14/2008   Osteoarthritis 01/14/2008   BACK PAIN, LUMBAR 01/14/2008   SEIZURES, HX OF 01/14/2008    Onset date: Voice: ~3 months ago,                       Swallowing: Beginning of the year.  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 Pt accompanied by: self  PERTINENT HISTORY: (From 08/31/24 by Dr. Okey) [name] is an 84 year old female with a history of hiatal hernia on EGD 20 yrs ago, who presents with difficulty swallowing.   She has been experiencing difficulty swallowing for the past three months, describing it as a sensation of food not wanting to go down, particularly when taking medication. This occurs most of the time but not always. No recent illness preceded the onset of these symptoms.   A swallow study conducted in August was normal, but it only assessed the upper portion of her swallow. An  MRI of the brain was also normal, though there are some changes noted in the cervical spine.   She has a history of reflux and heartburn for which she is taking medication, and she reports that it helps. She has previously seen a GI doctor and underwent an upper endoscopy in 2005, which revealed a hiatal hernia.   She experiences hoarseness from time to time as well.  PAIN:  Are you having pain? Yes: Pain description: Back pain and arthritis pain.  FALLS: Has patient fallen in last 6 months? No, Number of falls: 0  LIVING ENVIRONMENT: Lives with: grandson Lives in: House/apartment  PLOF:Level of assistance: Independent with ADLs Employment: Retired  PATIENT GOALS: To improve voice and reduce perceived swallowing difficulties.  OBJECTIVE:  Note: Objective measures were completed at Evaluation unless otherwise noted.  DIAGNOSTIC FINDINGS: Flexible Fiberoptic Laryngoscopy Procedure Note (From 10/21/24) Pre-Op Diagnosis: hoarseness                                Post Op Diagnosis: same Procedure: Flexible Fiberoptic Laryngoscopy CPT 31575 - Mod 25 Surgeon: Adah Malkin, D.O. 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 It was passed along the floor of the nasal cavity to the nasopharynx. Torus tubarius was patent and the Fossa of Rosenmller was identified. The scope was then flexed caudally and advanced slowly through the nasopharynx, passed through the oropharynx, and down into the hypopharynx. The patient's oro- and nasopharynx were unremarkable with no signs of any gross lesions, edema, masses, or bleeding.  The base of tongue was visualized and no mass, ulceration or lesion was appreciated. The epiglottis did not demonstrate any mass, ulceration or lesion. The vallecula was also assessed with no mass, ulceration or lesion. The patient had good glottic closure upon phonation and no signs of aspiration or pooling of secretions. The patient was asked to inspire and expire with the true vocal folds vibrating normally and without evidence of vocal fold dysfunction. The right true vocal fold was noted to have a polypoid growth at the anterior 1/3 approaching the commissure. The true and false vocal cords, interarytenoid, AE folds, and arytenoids did not demonstrate any significant edema or erythema. The patient was then asked to valsalva, and the pyriform sinuses were assessed which were unremarkable. The airway was patent and there was no evidence of compromise. The scope was then slowly withdrawn from the patient. The patient tolerated the procedure well and there were no complications.  Disposition: Stable  COGNITION: Overall cognitive status: Within functional limits for tasks assessed   SOCIAL HISTORY: Occupation: Retired Water  intake: Five 12 oz/day. Caffeine/alcohol  intake: minimal and pt is currently drinking 1 soda/not consistently.  Daily voice use: moderate  PERCEPTUAL VOICE  ASSESSMENT: Voice quality: hoarse, breathy, rough, strained, and low vocal intensity Vocal abuse: habitual abnormal pitch Resonance: normal Respiratory function: thoracic breathing GRBAS: G-2, R-2, B-2, A-2, S-1  OBJECTIVE VOICE ASSESSMENT: Maximum phonation time for sustained ah: 2.8 seconds Conversational pitch average: 105 Hz Conversational pitch range:  90-130 Hz Conversational loudness average: 71 dB Conversational loudness range: 61-81 dB S/z ratio:  2.91 (Suggestive of dysfunction >1.0)  PATIENT REPORTED OUTCOME MEASURES (PROM): V-RQOL: 33/50                                                                                                                            TREATMENT DATE:  12/05/24: Pt feels that her voice is a little better from previous session. Pt comes in with a slightly more forward-resonant voice compared to previous sessions. Pt's voice switched more to back-resonance as session went on. Pt brought in her Expiratory Muscle Strength Training device. SLP reviewed how to perform EMST at 35 cm H2O. Pt performed EMST at 35 cm H2O successfully in 90% of opportunities given frequent min A. SLP reviewed resonant voice therapy (RVT) for optimizing pt forward resonance for balanced phonation. Pt said RVT /m/ words, phrases, and sentences with clear voicing on 70% of opportunities given frequent mod to min A. SLP briefly reviewed flow phonation strategies for maximizing pt airflow for phonation. SLP required pt to sustain /s/ and /z/ for demonstrating airflow requirements for clear voicing. Pt benefits from more explicit instruction is respiratory retraining/flow phonation for improving airflow for phonation. Plan is to continue respiratory retraining/EMST/Flow phonation/RVT for decreasing laryngeal hyperfunction for improved pt QOL.   11/28/24: Pt has been practicing her HEP every day since previous session. She notes that she sang in church on Sunday and felt that her voice felt  fine. Pt states that her voice sounds a little better this week. SLP reviewed diaphragmatic breathing. Pt demonstrated diaphragmatic breathing in 80% of opportunities during structured voice exercises given occasional min A. SLP provided direct instruction and modeling for completion of resonant voice therapy exercises to encourage forward resonance and decrease tension when voicing. Pt demonstrated forward-resonance with 75% consistency during structured exercises at the word, phrase, and sentence levels with /m/ and /n/ sounds, provided with frequent min-A from SLP. Pt benefitting from cues to increase forward placement of the voice. SLP introduced Semi-Occluded Vocal Tract Exercises (SOVTEs) (Straw Phonation) as a strategy for increasing consistent airflow for voice production. After SLP guidance, pt successfully completed SOVTEs using a straw with and without water  given frequent min A for balanced phonation. Pt cleared her throat 4x during session. SLP educated pt about alternative strategies to decrease throat clearing for optimal vocal hygiene (water  sips, hard swallow, shhhhh follwed by a deep breath). Pt is agreeable to trying alternative strategies for decreasing throat-clearing. SLP provided handout related to throat-clearing strategies for improving vocal hygiene. Plan is to continue resonant voice therapy and respiratory muscle strength training for decreasing laryngeal hyperfunction.  11/21/24: Pt has been practicing her HEP every day since previous session. Pt states that her voice sounds a little better this week. SLP reviewed diaphragmatic breathing. Pt demonstrated diaphragmatic breathing in 75% of opportunities during structured voice exercises given occasional min A. SLP provided direct instruction and modeling for completion of resonant voice therapy exercises to encourage forward resonance and decrease tension when voicing. Pt demonstrated  forward-resonance with 70% consistency during  structured exercises at the word, phrase, and sentence level with /m/ and /n/ sounds, provided with frequent min-A from SLP. Pt benefitting from cues to increase forward placement of the voice. Updated HEP to include /n/ words, phrases, and sentences for continued resonant voice work. Plan is to continue resonant voice therapy and respiratory muscle strength training for decreasing laryngeal hyperfunction.  11/16/24: Pt has been practicing her HEP every day since previous session. Pt is trying to drink 5-6 water  bottles, which is an improvement from prior session. Pt was 65% consistent with performing diaphragmatic breathing given frequent min A. SLP reviewed Resonant Voice Therapy (RVT) for maximizing forward resonance during voice production. During /b/ words, pt was 70% consistent with achieving forward resonance during voice production given frequent min A. SLP challenged pt to utilize forward resonance during /m/ words. Pt achieved clear voice in 65% of opportunities during /m/ word productions given frequent mod A. Suspect that pt's breath management is a major factor for reduced performance. SLP introduced Respiratory Muscle Strength Training (RMST) as a tool for improving expiratory muscle strength for voice production. SLP began guiding pt through using RMST device; pt displayed emerging understanding of device usage. Pt will need reinforcement in upcoming session for improving understanding and performance with device. Pt is making progress with voice goals. Plan is to continue RVT and RMST for optimizing voice production for QOL.  11/09/24: SLP had pt complete EAT-10 to assess self-perception of swallow ability. Pt notes that swallowing pills and solids is more difficult than liquids. SLP introduced strategies for vocal hygiene (increased water  intake, GERD lifestyle changes). Pt is agreeable to making adjustments to water  intake and utilizing lifestyle changes for GERD to optimize vocal health and  wellness. SLP engaged pt in respiratory retraining, focusing on establishing diaphragmatic breath for balance phonation. After SLP guided demonstration, pt was 70% consistent with performing diaphragmatic breathing given frequent min A. SLP reintroduced Resonant Voice Therapy (RVT) for maximizing forward resonance during voice production. During /b/ words, pt was 75% consistent with achieving forward resonance during voice production given frequent min A. Pt is making progress with voice goals. Plan is to continue RVT and introduce vocal function exercises for reducing laryngeal hyperfunction.   10/31/24: Evaluation completed. SLP initiated tx on this date. SLP performed stimulability with pt to measure prognosis with voice therapy exercises. SLP introduced Resonant Voice Therapy (RVT) through /b/ syllables and words. Pt demonstrated ability to achieve clear voicing using RVT. SLP educated pt about basic physiology of the voice (respiration, phonation, articulation/resonance) for optimizing pt understanding of voice exercises. Pt demonstrated emerging understanding of physiology of voice; will need reinforcement in future sessions. Plan is to continue RVT and introduced vocal function exercises (VFEs) for maximizing clear voicing for QOL.    PATIENT EDUCATION: Education details: Physiology of voice Person educated: Patient Education method: Explanation Education comprehension: verbalized understanding  HOME EXERCISE PROGRAM: TBD in upcoming sessions.  GOALS: Goals reviewed with patient? Yes  SHORT TERM GOALS: Target date: 12/03/24  Pt will utilize diaphragmatic breathing during structured exercises and conversation in 80% of opportunities given occasional min A. Baseline: Pt uses thoracic breathing pattern. Goal status: INITIAL  2.  Pt will achieve clear voicing during structured exercises and conversation in 80% of opportunities across 2 sessions given occasional min A. Baseline:  Goal  status: INITIAL  3.  Pt will describe the basic physiology of the voice (Breathing, Phonation, Artic/Resonance) as related to voice therapy exercises with 90%  consistency given rare min A.  Baseline:  Goal status: INITIAL  4.  Pt will describe at least 4 swallow precautions/exercises to given min A.  Baseline:  Goal status: INITIAL  5.  Pt will perform structured, sustained phonation exercises with clear voicing in 80% of opportunities when given occasional min A.  Baseline:  Goal status: INITIAL  6.  Pt will complete PROM for swallowing/reflux. Baseline:  Goal status: MET  LONG TERM GOALS: Target date: 01/09/25  Pt will achieve clear voicing during unstructured conversation in 85% of opportunities given rare min A.  Baseline: Pt voice is dysphonic.  Goal status: INITIAL  2.  Pt will report decreased score on PROM (V-RQOL), displaying self-perceived improvement of voice. Baseline: 33/50 Goal status: INITIAL  3.  Pt will describe and utilize swallow precautions to minimize self-reported swallowing difficulties.  Baseline:  Goal status: INITIAL   ASSESSMENT:  CLINICAL IMPRESSION: Patient is a 84 y.o. female who was seen today for a behavioral voice evaluation. Pt presents with a moderate dysphonia characterized by rough, breathy, and hoarse vocal quality and 33/50 on V-RQOL scale. According to PROM, pt frequently experiences depressive thoughts and avoids socializing in public due to dysphonia. Pt noted that her dysphonia has affected her ability to sing. Pt was a regular singer up until 3 months ago when her dysphonia started. In addition, pt reported trouble swallowing, specifically with pills. Pt had MBSS recently (07/08/24), with no significant findings. Pt does note hx of reflux and that she currently takes medication for reflux.   OBJECTIVE IMPAIRMENTS: include voice disorder. These impairments are limiting patient from effectively communicating at home and in  community. Factors affecting potential to achieve goals and functional outcome are N/A. Patient will benefit from skilled SLP services to address above impairments and improve overall function.  REHAB POTENTIAL: Good  PLAN:  SLP FREQUENCY: 1x/week  SLP DURATION: 10 weeks  PLANNED INTERVENTIONS: Aspiration precaution training, SLP instruction and feedback, Compensatory strategies, and Patient/family education    [employer], CF-SLP 12/05/2024, 11:05 AM      "

## 2024-12-07 ENCOUNTER — Ambulatory Visit: Admitting: Physical Therapy

## 2024-12-07 ENCOUNTER — Encounter: Payer: Self-pay | Admitting: Physical Therapy

## 2024-12-07 VITALS — BP 113/67 | HR 59

## 2024-12-07 DIAGNOSIS — R2689 Other abnormalities of gait and mobility: Secondary | ICD-10-CM

## 2024-12-07 DIAGNOSIS — M6281 Muscle weakness (generalized): Secondary | ICD-10-CM

## 2024-12-07 NOTE — Therapy (Signed)
 " OUTPATIENT PHYSICAL THERAPY NEURO TREATMENT--10th VISIT PROGRESS NOTE  Physical Therapy Progress Note   Dates of Reporting Period: 10/31/24 - 12/07/24  See Note below for Objective Data and Assessment of Progress/Goals.  Thank you for the referral of this patient. Damien Caulk, PT   Patient Name: [name] MRN: [id number] DOB:1941-03-16, 84 y.o., female Today's Date: 12/07/2024  PCP: [name], MD REFERRING PROVIDER: Gregg Lek, MD  END OF SESSION:  PT End of Session - 12/07/24 1105     Visit Number 10    Number of Visits 17   16 plus Eval   Date for Recertification  01/09/25   for scheduling delays   Authorization Type [employer]    PT Start Time 1103    PT Stop Time 1141   pt needing to leave early for car appt   PT Time Calculation (min) 38 min    Equipment Utilized During Treatment Gait belt    Activity Tolerance Patient tolerated treatment well   with pacing/rest breaks   Behavior During Therapy WFL for tasks assessed/performed          Past Medical History:  Diagnosis Date   Allergic rhinitis    Arnold-Chiari malformation (HCC)    Asthma    CAD (coronary artery disease)    COPD (chronic obstructive pulmonary disease) (HCC)    Coughing    coughing since last 01-22-2019 , started on cefdinir bid x10 days, has 3 pills left today , reports she feels mcuh better , still coughing copiuus amonts of thick white sputum , denies fever nor chills, nor body aches .    DJD (degenerative joint disease)    GERD (gastroesophageal reflux disease)    Hiatal hernia    History of absence seizures    last confirmed seizure age 30     Hx of colonic polyps    Hypercholesteremia    Hypertension    Obesity    Positive PPD    Reflex sympathetic dystrophy    Sleep apnea    Varicose veins    Venous insufficiency    Past Surgical History:  Procedure Laterality Date   ABDOMINAL HYSTERECTOMY     CHOLECYSTECTOMY N/A 02/19/2023   Procedure: LAPAROSCOPIC  CHOLECYSTECTOMY WITH INTRAOPERATIVE CHOLANGIOGRAM AND ICG DYE;  Surgeon: Tanda Locus, MD;  Location: [facility] OR;  Service: General;  Laterality: N/A;   cspine surgery  12/02/1991   for arnold-chiari malformation   IR BONE MARROW BIOPSY & ASPIRATION  01/28/2023   JOINT REPLACEMENT  06/05/2011   Left total knee replacement   right knee arthroscopy  12/02/2007   Dr. Duwayne   right total knee replacement  05/31/2008   Dr. Duwayne   TONSILLECTOMY     TOTAL KNEE REVISION Left 02/03/2019   Procedure: LEFT TOTAL KNEE REVISION;  Surgeon: Fidel Rogue, MD;  Location: [facility] ORS;  Service: Orthopedics;  Laterality: Left;   Patient Active Problem List   Diagnosis Date Noted   Seasonal and perennial allergic rhinitis 07/18/2024   History of smoking 25-50 pack years 07/18/2024   Painful swallowing 07/18/2024   Abnormal CT scan 12/19/2022   Pain of upper abdomen 12/19/2022   Dilation of biliary tract 12/16/2022   Cellulitis of right leg 06/18/2022   Hyperglycemia 06/18/2022   Laceration of right index finger 05/08/2022   Acquired hallux valgus of left foot 11/22/2021   Primary localized osteoarthrosis of ankle and foot 11/22/2021   AKI (acute kidney injury) 03/13/2021   Memory loss 03/13/2021  Anxiety 03/13/2021   Hypokalemia 03/05/2021   Generalized weakness 03/04/2021   Dizziness 03/04/2021   Vitamin D  deficiency 01/16/2021   Cough 12/13/2020   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   Allergic rhinitis due to animal (cat) (dog) hair and dander 12/04/2020   Allergic rhinitis due to pollen 12/04/2020   Moderate persistent asthma, uncomplicated 12/04/2020   Radiculopathy, lumbar region 11/19/2020   Spondylolisthesis, lumbar region 11/19/2020   Leg cramping 11/06/2020   Degeneration of lumbar intervertebral disc 09/27/2020   Degenerative spondylolisthesis 08/15/2020   Pes anserinus bursitis of right knee 05/07/2020   Ulcer of toe, left, limited to breakdown of skin (HCC) 08/31/2019   Diastolic CHF, chronic (HCC)  08/31/2019   PVD (peripheral vascular disease) 08/31/2019   Bilateral carpal tunnel syndrome 08/19/2019   Posterior neck pain 08/19/2019   Vertigo 04/08/2019   Anemia 04/08/2019   Stiffness of left knee 02/10/2019   Failed total knee, left 02/03/2019   Failed total knee, left, initial encounter 02/03/2019   Carpal tunnel syndrome of left wrist 01/21/2019   Pain of left hand 01/21/2019   Preop exam for internal medicine 11/02/2018   Left hand paresthesia 11/02/2018   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 11/01/2018   Hyperopia with presbyopia, bilateral 06/29/2018   Abdominal pain, lower 03/02/2018   Diarrhea 03/02/2018   Encounter for well adult exam with abnormal findings 11/14/2017   Arnold-Chiari malformation (HCC)    Asthma, well controlled    CAD (coronary artery disease)    COPD (chronic obstructive pulmonary disease) (HCC)    DJD (degenerative joint disease)    History of absence seizures    Hx of colonic polyps    Hypercholesteremia    Positive PPD    Venous insufficiency    Pain in left lower leg 07/20/2017   Cellulitis of left lower extremity 06/18/2017   Bilateral lower extremity edema 04/30/2017   Shingles 02/26/2017   COPD exacerbation (HCC) 01/07/2017   S/P TKR (total knee replacement), bilateral 01/07/2017   Right arm pain 05/16/2015   Generalized anxiety disorder 08/31/2014   Varicose veins of bilateral lower extremities with other complications 01/06/2012   Edema 08/07/2011   MENORRHAGIA, POSTMENOPAUSAL 08/13/2010   CIGARETTE SMOKER 06/11/2010   CARDIAC MURMUR 11/02/2009   Bradycardia 11/01/2009   CHEST PAIN 11/01/2009   Allergic rhinitis 04/19/2009   Chronic obstructive airway disease with asthma (HCC) 04/18/2009   LEG CRAMPS, NOCTURNAL 11/02/2008   Essential hypertension 01/17/2008   COLONIC POLYPS 01/14/2008   Body mass index (BMI) 36.0-36.9, adult 01/14/2008   Reflex sympathetic dystrophy 01/14/2008   Coronary atherosclerosis  01/14/2008   LPRD (laryngopharyngeal reflux disease) 01/14/2008   Osteoarthritis 01/14/2008   BACK PAIN, LUMBAR 01/14/2008   SEIZURES, HX OF 01/14/2008    ONSET DATE: 09/30/2024 (date of referral)  REFERRING DIAG: R53.1 (ICD-10-CM) - Weakness M54.2 (ICD-10-CM) - Cervicalgia  THERAPY DIAG:  Other abnormalities of gait and mobility  Muscle weakness (generalized)  Rationale for Evaluation and Treatment: Rehabilitation  SUBJECTIVE:  Likes to be called [name]  SUBJECTIVE STATEMENT: Pt arrives ambulating with RW.  Needs to leave PT early d/t appt to fix her car.  No recent falls.  No acute changes reported.  Pt reports feeling stronger in general since starting therapy. Pt accompanied by: self  PERTINENT HISTORY: Pt with tremors and weakness (noted back in June; went to ED; MRI brain did not show acute abnormalities).  Difficulty eating and holding objects; difficulty writing; tremors worsen during the day.  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  Per PT referral: Cervicalgia. Left side weakness. 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  PAIN:  Are you having pain? Yes: NPRS scale: 6/10 Pain location: low back Pain description: sharp shooting; aching Aggravating factors: standing a lot Relieving factors: pain patches; medication cream  PRECAUTIONS: Fall; Swallowing difficulties (with pills and certain foods; not liquids/water )  RED FLAGS: None   WEIGHT BEARING RESTRICTIONS: No  FALLS: Has patient fallen in last 6 months? No  LIVING  ENVIRONMENT: Lives with: lives with their family (grandson) Lives in: House/apartment Stairs: Yes: External: 5 steps; on right going up, on left going up, and can reach both Has following equipment at home: Single point cane and Walker - 2 wheeled  PLOF: Ambulates with RW (last 6 months or more).  Participated in OP PT first part of this year.  Was doing water  aerobics at [facility] (hasn't done in last 5 months d/t stomach acting up).  PATIENT GOALS: Try to get strength back and do some of the things she was able to do before.  OBJECTIVE:  Note: Objective measures were completed at Evaluation unless otherwise noted.  DIAGNOSTIC FINDINGS:  MRI C-spine 09/26/24: - Multilevel spondylosis, disc bulging, reversal of normal cervical curvature and grade 2 anterior spondylolisthesis of C2 and C3 measuring 7 mm. - At C6-7 disc bulging and facet hypertrophy with moderate to severe spinal stenosis and severe bilateral foraminal stenosis; no cord signal abnormalities. - At C2-3 pseudo disc bulging and facet hypertrophy with moderate spinal stenosis and severe bilateral foraminal stenosis; no cord signal abnormalities. - At C3-4 disc bulging with mild spinal stenosis and severe bilateral foraminal stenosis; no cord signal abnormalities.  COGNITION: Overall cognitive status: Within functional limits for tasks assessed   SENSATION: Decreased light touch sensation L lateral thigh, medial lower leg, and entire foot  COORDINATION: Decreased alternating toe taps L LE compared to R LE  EDEMA: (chronic L>R LE per pt report) Circumferential: malleolar line 27.5 cm R ankle; 28 cm L ankle  POSTURE: rounded shoulders and forward head  LOWER EXTREMITY MMT:    MMT Right Eval Left Eval  Hip flexion 4+/5 4+/5  Hip extension    Hip abduction    Hip adduction    Hip internal rotation    Hip external rotation    Knee flexion    Knee extension 4+/5 4+/5  Ankle dorsiflexion 4+/5 2/5  Ankle plantarflexion At  least 3/5 AROM At least 2/5 AROM  Ankle inversion    Ankle eversion    (Blank rows = not tested)  TRANSFERS: Sit to stand: Modified independence  Assistive device utilized: [employer] - 2 wheeled     Stand to sit: Modified independence  Assistive device utilized: [employer] Consultant - 2 wheeled      GAIT: Findings:  Gait Characteristics: narrow BOS; R LE ER>L LE; foot flat R LE; decreased L LE DF with forefoot initial WB'ing during initial contact L LE; increased L trunk lean; short steps, Distance walked: clinic distances, Assistive device utilized:Walker - 2 wheeled, Level of assistance: Modified independence, and Comments: decreased gait speed  FUNCTIONAL TESTS:  5 times sit to stand: 49.78 seconds with B UE support (up to RW); 35.47 seconds 11/28/24 10 meter walk test: 0.31 m/sec (31.63 seconds with RW use); 0.51 m/sec (19.35 seconds) with RW 12/07/24 BERG Balance Test: 22/56 Item T/est date: 11/07/24 Date:  Date:   Sitting to standing 3. able to stand independently using hands Insert SmartPhrase OPRCBERGREEVAL Insert SmartPhrase OPRCBERGREEVAL  2. Standing unsupported 3. able to stand 2 minutes with supervision    3. Sitting with back unsupported, feet supported 4. able to sit safely and securely for 2 minutes    4. Standing to sitting 3. controls descent by using hands    5. Pivot transfer  3. able to transfer safely with definite need of hands    6. Standing unsupported with eyes closed 3. able to stand 10 seconds with supervision    7. Standing unsupported with feet together 1. needs help to attain position but able to stand 15 seconds feet together    8. Reaching forward with outstretched arms while standing 1. reaches forward but needs supervision    9. Pick up object from the floor from standing 0. unable to try/needs assistance to keep from losing balance or falling; needs assist for balance    10. Turning to look behind over left and right shoulders while standing 1. needs supervision when turning     11. Turn 360 degrees 0. needs assistance while turning    12. Place alternate foot on step or stool while standing unsupported 0. needs assistance to keep from falling/unable to try; assist to keep balance with 2 attempts    13. Standing unsupported one foot in front 0. loses balance while stepping or standing    14. Standing on one leg 0. unable to try of needs assist to prevent fall ; need assist to maintain balance     Total Score 22/56 Total Score:    Total Score:      PATIENT SURVEYS:  TBA                                                                                                                             TREATMENT DATE: 12/07/24  Self Care: BP and HR taken in sitting at rest beginning of session (see below for details). Vitals:   12/07/24 1107  BP: 113/67  Pulse: (!) 59   Therapeutic Activity: : 0.51 m/sec (19.35 seconds) with RW Toe taps to 6 inch step (at mat table) no UE support: x10 reps x3 sets B LE's Notes: CGA; pt with occasional catching of foot and LOB but able to self correct Standing balance on Airex: Feet shoulder  width apart: x10 reps x2 sets of pt bouncing ball off chair in front of pt and then catching ball Feet together: x10 reps x2 sets of pt bouncing ball off chair in front of pt and then catching ball Notes: CGA for safety Static standing with eyes closed x30 seconds (no UE support) Notes: CGA for safety; leaning towards L at times; increased ankle and hip strategies noted to maintain balance x10 reps sit to stand on Airex with use of B UE's (no UE support once standing); from mat table Notes: mild unsteadiness noted upon standing but pt able to maintain balance; CGA for safety  Therapeutic Exercise: SciFit multi-peaks up to level 4 for 8 minutes using BUE/BLEs for global strengthening, dynamic cardiovascular activity and increased amplitude of stepping. RPE of 7/10 following activity.  Average stride length 6.0 inches.   PATIENT  EDUCATION: Education details: Continue HEP.  results. Person educated: Patient Education method: Explanation, Demonstration, Verbal cues, and Handouts Education comprehension: verbalized understanding and needs further education  HOME EXERCISE PROGRAM: Access Code: [email or web address] URL: [email or web address] Date: 11/07/2024 Prepared by: Damien Caulk  Exercises - Seated March  - 1 x daily - 5 x weekly - 1-2 sets - 10 reps - Seated Long Arc Quad  - 1 x daily - 5 x weekly - 1-2 sets - 10 reps - Seated Hip Abduction with Resistance  - 1 x daily - 5 x weekly - 1-2 sets - 10 reps - Seated Heel Raise  - 1 x daily - 5 x weekly - 1-2 sets - 10 reps - Seated Toe Raise  - 1 x daily - 5 x weekly - 1-2 sets - 10 reps  GOALS: Goals reviewed with patient? Yes  SHORT TERM GOALS: Target date: 11/28/2024  Pt will be independent with initial HEP in order to improve strength and balance in order to decrease fall risk and improve function at home for ADL's. Baseline: TBA Goal status: MET  2.  Assess BERG Balance Test and update LTG as needed. Baseline: 22/56 11/07/24 Goal status: MET  3.  Pt will decrease 5 Time Sit to Stand by at least 10 seconds in order to demonstrate clinically significant improvement in LE strength. Baseline: 49.78 seconds (Eval); 35.47 seconds 11/28/24 Goal status: MET   LONG TERM GOALS: Target date: 12/26/2024  Pt will be independent with HEP in order to improve strength and balance in order to decrease fall risk and improve function at home for ADL's. Baseline: TBA Goal status: INITIAL  2.  Patient will demonstrate an improved Berg Balance Score of 40 or greater as to demonstrate improved balance with ADLs and reduced fall risk. Baseline: 22/56 11/07/24 Goal status: REVISED  3.  Pt will decrease 5 Time Sit to Stand by at least 20 seconds in order to demonstrate clinically significant improvement in LE strength. Baseline: 49.78 seconds (Eval); 35.47  seconds 11/28/24 Goal status: INITIAL  4.  Pt will increase by at least 0.13 m/s in order to demonstrate clinically significant improvement in community ambulation.  Baseline: 0.31 m/sec on Eval; 0.51 m/sec 12/07/24 Goal status: MET  5.  Pt will demonstrate improved L LE foot clearance for safety with gait. Baseline: Decreased L foot clearance with RW use Goal status: INITIAL  ASSESSMENT:  CLINICAL IMPRESSION: Patient was seen today for physical therapy treatment to address outcome measure, balance, and strength. This is pt's 10th physical therapy visit.  Focused session on assessing , balance on compliant surface, and global strengthening.  Pt  scored 0.51 m/sec on the 10 Meter Walk Test indicating pt is a limited community ambulator (Cut off scores: <0.4 m/s = household Ambulator, 0.4-0.8 m/s = limited community Ambulator, >0.8 m/s = community Ambulator, >1.2 m/s = crossing a street, <1.0 = increased fall risk); improved from 0.31 m/sec on Eval.  Patient continues to be limited by strength, balance, and endurance (pt paced and given rest breaks between activities).  They demonstrate improvement in standing balance with activities today.  They would continue to benefit from skilled PT to address impairments as noted and progress towards long term goals.  OBJECTIVE IMPAIRMENTS: Abnormal gait, cardiopulmonary status limiting activity, decreased activity tolerance, decreased balance, decreased coordination, decreased endurance, decreased knowledge of condition, decreased knowledge of use of DME, decreased mobility, difficulty walking, decreased ROM, decreased strength, increased edema, impaired flexibility, impaired sensation, improper body mechanics, postural dysfunction, and pain.   ACTIVITY LIMITATIONS: carrying, lifting, bending, standing, squatting, stairs, transfers, bed mobility, continence, toileting, dressing, locomotion level, and caring for others  PARTICIPATION LIMITATIONS: meal  prep, cleaning, laundry, shopping, community activity, and yard work  PERSONAL FACTORS: Age, Fitness, Past/current experiences, and 1-2 comorbidities: COPD, HFpEF, bradycardia, and memory loss are also affecting patient's functional outcome.   REHAB POTENTIAL: Good  CLINICAL DECISION MAKING: Evolving/moderate complexity  EVALUATION COMPLEXITY: Moderate  PLAN:  PT FREQUENCY: 2x/week  PT DURATION: 8 weeks  PLANNED INTERVENTIONS: [id number]- PT Re-evaluation, 97750- Physical Performance Testing, 97110-Therapeutic exercises, 97530- Therapeutic activity, [id number]- Neuromuscular re-education, 97535- Self Care, [id number]- Manual therapy, [id number]- Gait training, [phone]- Orthotic Initial, [phone]- Orthotic/Prosthetic subsequent, [phone]- Aquatic Therapy, [phone]- Electrical stimulation (manual), [id number]- Ultrasound, [id number]- Traction (mechanical), [phone] (1-2 muscles), [id number] (3+ muscles)- Dry Needling, Patient/Family education, Balance training, Stair training, Taping, Joint mobilization, Spinal mobilization, Compression bandaging, Cognitive remediation, DME instructions, Cryotherapy, Moist heat, and Biofeedback  PLAN FOR NEXT SESSION: Check on HEP; balance, strengthening, aerobic endurance; coordination.   Damien Caulk, PT 12/07/2024, 12:22 PM        "

## 2024-12-12 ENCOUNTER — Ambulatory Visit: Admitting: Physical Therapy

## 2024-12-12 ENCOUNTER — Encounter: Payer: Self-pay | Admitting: Physical Therapy

## 2024-12-12 ENCOUNTER — Ambulatory Visit

## 2024-12-12 VITALS — BP 114/79 | HR 54

## 2024-12-12 DIAGNOSIS — M6281 Muscle weakness (generalized): Secondary | ICD-10-CM

## 2024-12-12 DIAGNOSIS — R490 Dysphonia: Secondary | ICD-10-CM

## 2024-12-12 DIAGNOSIS — R2689 Other abnormalities of gait and mobility: Secondary | ICD-10-CM

## 2024-12-12 NOTE — Therapy (Signed)
 " OUTPATIENT PHYSICAL THERAPY NEURO TREATMENT   Patient Name: [name] MRN: [id number] DOB:02/20/1941, 84 y.o., female Today's Date: 12/12/2024  PCP: April Millman, MD REFERRING PROVIDER: Gregg Lek, MD  END OF SESSION:  PT End of Session - 12/12/24 1105     Visit Number 11    Number of Visits 17   16 plus Eval   Date for Recertification  01/09/25   for scheduling delays   Authorization Type [employer]    PT Start Time 1103    PT Stop Time 1150    PT Time Calculation (min) 47 min    Equipment Utilized During Treatment Gait belt    Activity Tolerance Patient tolerated treatment well   with pacing/rest breaks   Behavior During Therapy WFL for tasks assessed/performed          Past Medical History:  Diagnosis Date   Allergic rhinitis    Arnold-Chiari malformation (HCC)    Asthma    CAD (coronary artery disease)    COPD (chronic obstructive pulmonary disease) (HCC)    Coughing    coughing since last 01-22-2019 , started on cefdinir bid x10 days, has 3 pills left today , reports she feels mcuh better , still coughing copiuus amonts of thick white sputum , denies fever nor chills, nor body aches .    DJD (degenerative joint disease)    GERD (gastroesophageal reflux disease)    Hiatal hernia    History of absence seizures    last confirmed seizure age 28     Hx of colonic polyps    Hypercholesteremia    Hypertension    Obesity    Positive PPD    Reflex sympathetic dystrophy    Sleep apnea    Varicose veins    Venous insufficiency    Past Surgical History:  Procedure Laterality Date   ABDOMINAL HYSTERECTOMY     CHOLECYSTECTOMY N/A 02/19/2023   Procedure: LAPAROSCOPIC CHOLECYSTECTOMY WITH INTRAOPERATIVE CHOLANGIOGRAM AND ICG DYE;  Surgeon: Tanda Locus, MD;  Location: [facility] OR;  Service: General;  Laterality: N/A;   cspine surgery  12/02/1991   for arnold-chiari malformation   IR BONE MARROW BIOPSY & ASPIRATION  01/28/2023   JOINT REPLACEMENT  06/05/2011    Left total knee replacement   right knee arthroscopy  12/02/2007   Dr. Duwayne   right total knee replacement  05/31/2008   Dr. Duwayne   TONSILLECTOMY     TOTAL KNEE REVISION Left 02/03/2019   Procedure: LEFT TOTAL KNEE REVISION;  Surgeon: Fidel Rogue, MD;  Location: [facility] ORS;  Service: Orthopedics;  Laterality: Left;   Patient Active Problem List   Diagnosis Date Noted   Seasonal and perennial allergic rhinitis 07/18/2024   History of smoking 25-50 pack years 07/18/2024   Painful swallowing 07/18/2024   Abnormal CT scan 12/19/2022   Pain of upper abdomen 12/19/2022   Dilation of biliary tract 12/16/2022   Cellulitis of right leg 06/18/2022   Hyperglycemia 06/18/2022   Laceration of right index finger 05/08/2022   Acquired hallux valgus of left foot 11/22/2021   Primary localized osteoarthrosis of ankle and foot 11/22/2021   AKI (acute kidney injury) 03/13/2021   Memory loss 03/13/2021   Anxiety 03/13/2021   Hypokalemia 03/05/2021   Generalized weakness 03/04/2021   Dizziness 03/04/2021   Vitamin D  deficiency 01/16/2021   Cough 12/13/2020   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   Allergic rhinitis due to animal (cat) (dog) hair and dander 12/04/2020   Allergic rhinitis due to pollen  12/04/2020   Moderate persistent asthma, uncomplicated 12/04/2020   Radiculopathy, lumbar region 11/19/2020   Spondylolisthesis, lumbar region 11/19/2020   Leg cramping 11/06/2020   Degeneration of lumbar intervertebral disc 09/27/2020   Degenerative spondylolisthesis 08/15/2020   Pes anserinus bursitis of right knee 05/07/2020   Ulcer of toe, left, limited to breakdown of skin (HCC) 08/31/2019   Diastolic CHF, chronic (HCC) 08/31/2019   PVD (peripheral vascular disease) 08/31/2019   Bilateral carpal tunnel syndrome 08/19/2019   Posterior neck pain 08/19/2019   Vertigo 04/08/2019   Anemia 04/08/2019   Stiffness of left knee 02/10/2019   Failed total knee, left 02/03/2019   Failed total knee, left, initial  encounter 02/03/2019   Carpal tunnel syndrome of left wrist 01/21/2019   Pain of left hand 01/21/2019   Preop exam for internal medicine 11/02/2018   Left hand paresthesia 11/02/2018   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 11/01/2018   Hyperopia with presbyopia, bilateral 06/29/2018   Abdominal pain, lower 03/02/2018   Diarrhea 03/02/2018   Encounter for well adult exam with abnormal findings 11/14/2017   Arnold-Chiari malformation (HCC)    Asthma, well controlled    CAD (coronary artery disease)    COPD (chronic obstructive pulmonary disease) (HCC)    DJD (degenerative joint disease)    History of absence seizures    Hx of colonic polyps    Hypercholesteremia    Positive PPD    Venous insufficiency    Pain in left lower leg 07/20/2017   Cellulitis of left lower extremity 06/18/2017   Bilateral lower extremity edema 04/30/2017   Shingles 02/26/2017   COPD exacerbation (HCC) 01/07/2017   S/P TKR (total knee replacement), bilateral 01/07/2017   Right arm pain 05/16/2015   Generalized anxiety disorder 08/31/2014   Varicose veins of bilateral lower extremities with other complications 01/06/2012   Edema 08/07/2011   MENORRHAGIA, POSTMENOPAUSAL 08/13/2010   CIGARETTE SMOKER 06/11/2010   CARDIAC MURMUR 11/02/2009   Bradycardia 11/01/2009   CHEST PAIN 11/01/2009   Allergic rhinitis 04/19/2009   Chronic obstructive airway disease with asthma (HCC) 04/18/2009   LEG CRAMPS, NOCTURNAL 11/02/2008   Essential hypertension 01/17/2008   COLONIC POLYPS 01/14/2008   Body mass index (BMI) 36.0-36.9, adult 01/14/2008   Reflex sympathetic dystrophy 01/14/2008   Coronary atherosclerosis 01/14/2008   LPRD (laryngopharyngeal reflux disease) 01/14/2008   Osteoarthritis 01/14/2008   BACK PAIN, LUMBAR 01/14/2008   SEIZURES, HX OF 01/14/2008    ONSET DATE: 09/30/2024 (date of referral)  REFERRING DIAG: R53.1 (ICD-10-CM) - Weakness M54.2 (ICD-10-CM) - Cervicalgia  THERAPY DIAG:   Other abnormalities of gait and mobility  Muscle weakness (generalized)  Rationale for Evaluation and Treatment: Rehabilitation  SUBJECTIVE:  Likes to be called [name]  SUBJECTIVE STATEMENT: Pt arrived from SLP therapy; ambulating with RW.  No recent falls.  No acute changes reported. Pt accompanied by: self  PERTINENT HISTORY: Pt with tremors and weakness (noted back in June; went to ED; MRI brain did not show acute abnormalities).  Difficulty eating and holding objects; difficulty writing; tremors worsen during the day.  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  Per PT referral: Cervicalgia. Left side weakness. 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  PAIN: 5/10 stomach pain (h/o having on/off) Are you having pain? Yes: NPRS scale: 0/10 Pain location: low back Pain description: sharp shooting; aching Aggravating factors: standing a lot Relieving factors: pain patches; medication cream  PRECAUTIONS: Fall; Swallowing difficulties (with pills and certain foods; not liquids/water )  RED FLAGS: None   WEIGHT BEARING RESTRICTIONS: No  FALLS: Has patient fallen in last 6 months? No  LIVING ENVIRONMENT: Lives with: lives with their family (grandson) Lives in: House/apartment Stairs: Yes: External: 5 steps; on right going up, on left going up, and can reach both Has following equipment at home: Single point cane and Walker - 2 wheeled  PLOF: Ambulates with RW (last 6 months or more).  Participated in OP PT first part of this year.  Was doing water   aerobics at [facility] (hasn't done in last 5 months d/t stomach acting up).  PATIENT GOALS: Try to get strength back and do some of the things she was able to do before.  OBJECTIVE:  Note: Objective measures were completed at Evaluation unless otherwise noted.  DIAGNOSTIC FINDINGS:  MRI C-spine 09/26/24: - Multilevel spondylosis, disc bulging, reversal of normal cervical curvature and grade 2 anterior spondylolisthesis of C2 and C3 measuring 7 mm. - At C6-7 disc bulging and facet hypertrophy with moderate to severe spinal stenosis and severe bilateral foraminal stenosis; no cord signal abnormalities. - At C2-3 pseudo disc bulging and facet hypertrophy with moderate spinal stenosis and severe bilateral foraminal stenosis; no cord signal abnormalities. - At C3-4 disc bulging with mild spinal stenosis and severe bilateral foraminal stenosis; no cord signal abnormalities.  COGNITION: Overall cognitive status: Within functional limits for tasks assessed   SENSATION: Decreased light touch sensation L lateral thigh, medial lower leg, and entire foot  COORDINATION: Decreased alternating toe taps L LE compared to R LE  EDEMA: (chronic L>R LE per pt report) Circumferential: malleolar line 27.5 cm R ankle; 28 cm L ankle  POSTURE: rounded shoulders and forward head  LOWER EXTREMITY MMT:    MMT Right Eval Left Eval  Hip flexion 4+/5 4+/5  Hip extension    Hip abduction    Hip adduction    Hip internal rotation    Hip external rotation    Knee flexion    Knee extension 4+/5 4+/5  Ankle dorsiflexion 4+/5 2/5  Ankle plantarflexion At least 3/5 AROM At least 2/5 AROM  Ankle inversion    Ankle eversion    (Blank rows = not tested)  TRANSFERS: Sit to stand: Modified independence  Assistive device utilized: Environmental Consultant - 2 wheeled     Stand to sit: Modified independence  Assistive device utilized: Environmental Consultant - 2 wheeled      GAIT: Findings: Gait Characteristics: narrow BOS; R LE ER>L LE; foot flat  R LE; decreased L  LE DF with forefoot initial WB'ing during initial contact L LE; increased L trunk lean; short steps, Distance walked: clinic distances, Assistive device utilized:Walker - 2 wheeled, Level of assistance: Modified independence, and Comments: decreased gait speed  FUNCTIONAL TESTS:  5 times sit to stand: 49.78 seconds with B UE support (up to RW); 35.47 seconds 11/28/24 10 meter walk test: 0.31 m/sec (31.63 seconds with RW use); 0.51 m/sec (19.35 seconds) with RW 12/07/24 BERG Balance Test: 22/56 Item T/est date: 11/07/24 Date:  Date:   Sitting to standing 3. able to stand independently using hands Insert SmartPhrase OPRCBERGREEVAL Insert SmartPhrase OPRCBERGREEVAL  2. Standing unsupported 3. able to stand 2 minutes with supervision    3. Sitting with back unsupported, feet supported 4. able to sit safely and securely for 2 minutes    4. Standing to sitting 3. controls descent by using hands    5. Pivot transfer  3. able to transfer safely with definite need of hands    6. Standing unsupported with eyes closed 3. able to stand 10 seconds with supervision    7. Standing unsupported with feet together 1. needs help to attain position but able to stand 15 seconds feet together    8. Reaching forward with outstretched arms while standing 1. reaches forward but needs supervision    9. Pick up object from the floor from standing 0. unable to try/needs assistance to keep from losing balance or falling; needs assist for balance    10. Turning to look behind over left and right shoulders while standing 1. needs supervision when turning    11. Turn 360 degrees 0. needs assistance while turning    12. Place alternate foot on step or stool while standing unsupported 0. needs assistance to keep from falling/unable to try; assist to keep balance with 2 attempts    13. Standing unsupported one foot in front 0. loses balance while stepping or standing    14. Standing on one leg 0. unable to try of  needs assist to prevent fall ; need assist to maintain balance     Total Score 22/56 Total Score:    Total Score:      PATIENT SURVEYS:  TBA                                                                                                                             TREATMENT DATE: 12/12/24  Self Care: BP and HR taken in sitting at rest beginning of session (see below for details). Vitals:   12/12/24 1109  BP: 114/79  Pulse: (!) 54  Pt issued tennis balls for back of walker per pt request (therapist placed on pt's walker per pt request).  Therapeutic Exercise: SciFit multi-peaks up to level 4 for 8 minutes using BUE/BLEs for neural priming for global strengthening, dynamic cardiovascular warmup and increased amplitude of stepping. RPE of 6/10 following activity.  Average stride length 7.2 inches (improved from 6.0 inches).  Therapeutic Activity: Toe taps at stairs to 6 inch step (no UE support): x10 reps x3 sets B LE's Notes: sitting rest break after 2nd set required; CGA (except min assist 1x each on 1st and 2nd trial; CGA entire 3rd trial) Throwing/catching medium sized ball (compliant air filled) via re-bounder (to improve balance) x20 reps x1 set on firm surface (floor) x20 reps x2 sets standing on Airex Notes: more challenging and more use of ankle and hip strategies standing on Airex (CGA for safety)   PATIENT EDUCATION: Education details: Continue HEP.  Issued and placed tennis balls on back legs of walker (took off and threw out gliders per pt request). Person educated: Patient Education method: Explanation, Demonstration, Verbal cues, and Handouts Education comprehension: verbalized understanding and needs further education  HOME EXERCISE PROGRAM: Access Code: [email or web address] URL: [email or web address] Date: 11/07/2024 Prepared by: Damien Caulk  Exercises - Seated March  - 1 x daily - 5 x weekly - 1-2 sets - 10 reps - Seated Long Arc Quad  - 1 x daily - 5  x weekly - 1-2 sets - 10 reps - Seated Hip Abduction with Resistance  - 1 x daily - 5 x weekly - 1-2 sets - 10 reps - Seated Heel Raise  - 1 x daily - 5 x weekly - 1-2 sets - 10 reps - Seated Toe Raise  - 1 x daily - 5 x weekly - 1-2 sets - 10 reps  GOALS: Goals reviewed with patient? Yes  SHORT TERM GOALS: Target date: 11/28/2024  Pt will be independent with initial HEP in order to improve strength and balance in order to decrease fall risk and improve function at home for ADL's. Baseline: TBA Goal status: MET  2.  Assess BERG Balance Test and update LTG as needed. Baseline: 22/56 11/07/24 Goal status: MET  3.  Pt will decrease 5 Time Sit to Stand by at least 10 seconds in order to demonstrate clinically significant improvement in LE strength. Baseline: 49.78 seconds (Eval); 35.47 seconds 11/28/24 Goal status: MET   LONG TERM GOALS: Target date: 12/26/2024  Pt will be independent with HEP in order to improve strength and balance in order to decrease fall risk and improve function at home for ADL's. Baseline: TBA Goal status: INITIAL  2.  Patient will demonstrate an improved Berg Balance Score of 40 or greater as to demonstrate improved balance with ADLs and reduced fall risk. Baseline: 22/56 11/07/24 Goal status: REVISED  3.  Pt will decrease 5 Time Sit to Stand by at least 20 seconds in order to demonstrate clinically significant improvement in LE strength. Baseline: 49.78 seconds (Eval); 35.47 seconds 11/28/24 Goal status: INITIAL  4.  Pt will increase by at least 0.13 m/s in order to demonstrate clinically significant improvement in community ambulation.  Baseline: 0.31 m/sec on Eval; 0.51 m/sec 12/07/24 Goal status: MET  5.  Pt will demonstrate improved L LE foot clearance for safety with gait. Baseline: Decreased L foot clearance with RW use Goal status: INITIAL  ASSESSMENT:  CLINICAL IMPRESSION: Patient was seen today for physical therapy treatment to address  strength and balance.  Focused session on global strengthening, single leg balance/stability (toe taps to 6 inch step no UE support); and standing balance throwing/catching air filled compliant ball against re-bounder (standing on firm ground x1 trial and standing on compliant surface x2 trials).  Patient continues to be limited by strength, balance, and endurance (pt requires pacing and rest breaks between activities).  They demonstrate improvement  in activity tolerance in general.  They would continue to benefit from skilled PT to address impairments as noted and progress towards long term goals.  OBJECTIVE IMPAIRMENTS: Abnormal gait, cardiopulmonary status limiting activity, decreased activity tolerance, decreased balance, decreased coordination, decreased endurance, decreased knowledge of condition, decreased knowledge of use of DME, decreased mobility, difficulty walking, decreased ROM, decreased strength, increased edema, impaired flexibility, impaired sensation, improper body mechanics, postural dysfunction, and pain.   ACTIVITY LIMITATIONS: carrying, lifting, bending, standing, squatting, stairs, transfers, bed mobility, continence, toileting, dressing, locomotion level, and caring for others  PARTICIPATION LIMITATIONS: meal prep, cleaning, laundry, shopping, community activity, and yard work  PERSONAL FACTORS: Age, Fitness, Past/current experiences, and 1-2 comorbidities: COPD, HFpEF, bradycardia, and memory loss are also affecting patient's functional outcome.   REHAB POTENTIAL: Good  CLINICAL DECISION MAKING: Evolving/moderate complexity  EVALUATION COMPLEXITY: Moderate  PLAN:  PT FREQUENCY: 2x/week  PT DURATION: 8 weeks  PLANNED INTERVENTIONS: [id number]- PT Re-evaluation, 97750- Physical Performance Testing, 97110-Therapeutic exercises, 97530- Therapeutic activity, [id number]- Neuromuscular re-education, 97535- Self Care, [id number]- Manual therapy, [id number]- Gait training, [phone]- Orthotic Initial,  [phone]- Orthotic/Prosthetic subsequent, [phone]- Aquatic Therapy, [phone]- Electrical stimulation (manual), [id number]- Ultrasound, [id number]- Traction (mechanical), [phone] (1-2 muscles), [id number] (3+ muscles)- Dry Needling, Patient/Family education, Balance training, Stair training, Taping, Joint mobilization, Spinal mobilization, Compression bandaging, Cognitive remediation, DME instructions, Cryotherapy, Moist heat, and Biofeedback  PLAN FOR NEXT SESSION: Trial SPC (pt reporting goal to transition off walker).  Check on HEP.  Balance, strengthening, aerobic endurance; coordination.   Damien Caulk, PT 12/12/2024, 6:43 PM        "

## 2024-12-12 NOTE — Therapy (Signed)
 " OUTPATIENT SPEECH LANGUAGE PATHOLOGY VOICE TREATMENT   Patient Name: [name] MRN: [id number] DOB:1941-10-16, 84 y.o., female Today's Date: 12/12/2024  PCP: [name], MD REFERRING PROVIDER: Mila Adah JONELLE, DO  END OF SESSION:  End of Session - 12/12/24 1135     Visit Number 7    Number of Visits 11    Date for Recertification  01/09/25    SLP Start Time 1020    SLP Stop Time  1101    SLP Time Calculation (min) 41 min    Activity Tolerance Patient tolerated treatment well                Past Medical History:  Diagnosis Date   Allergic rhinitis    Arnold-Chiari malformation (HCC)    Asthma    CAD (coronary artery disease)    COPD (chronic obstructive pulmonary disease) (HCC)    Coughing    coughing since last 01-22-2019 , started on cefdinir bid x10 days, has 3 pills left today , reports she feels mcuh better , still coughing copiuus amonts of thick white sputum , denies fever nor chills, nor body aches .    DJD (degenerative joint disease)    GERD (gastroesophageal reflux disease)    Hiatal hernia    History of absence seizures    last confirmed seizure age 53     Hx of colonic polyps    Hypercholesteremia    Hypertension    Obesity    Positive PPD    Reflex sympathetic dystrophy    Sleep apnea    Varicose veins    Venous insufficiency    Past Surgical History:  Procedure Laterality Date   ABDOMINAL HYSTERECTOMY     CHOLECYSTECTOMY N/A 02/19/2023   Procedure: LAPAROSCOPIC CHOLECYSTECTOMY WITH INTRAOPERATIVE CHOLANGIOGRAM AND ICG DYE;  Surgeon: Tanda Locus, MD;  Location: [facility];  Service: General;  Laterality: N/A;   cspine surgery  12/02/1991   for arnold-chiari malformation   IR BONE MARROW BIOPSY & ASPIRATION  01/28/2023   JOINT REPLACEMENT  06/05/2011   Left total knee replacement   right knee arthroscopy  12/02/2007   Dr. Duwayne   right total knee replacement  05/31/2008   Dr. Duwayne   TONSILLECTOMY     TOTAL KNEE REVISION Left  02/03/2019   Procedure: LEFT TOTAL KNEE REVISION;  Surgeon: Fidel Rogue, MD;  Location: [facility];  Service: Orthopedics;  Laterality: Left;   Patient Active Problem List   Diagnosis Date Noted   Seasonal and perennial allergic rhinitis 07/18/2024   History of smoking 25-50 pack years 07/18/2024   Painful swallowing 07/18/2024   Abnormal CT scan 12/19/2022   Pain of upper abdomen 12/19/2022   Dilation of biliary tract 12/16/2022   Cellulitis of right leg 06/18/2022   Hyperglycemia 06/18/2022   Laceration of right index finger 05/08/2022   Acquired hallux valgus of left foot 11/22/2021   Primary localized osteoarthrosis of ankle and foot 11/22/2021   AKI (acute kidney injury) 03/13/2021   Memory loss 03/13/2021   Anxiety 03/13/2021   Hypokalemia 03/05/2021   Generalized weakness 03/04/2021   Dizziness 03/04/2021   Vitamin D  deficiency 01/16/2021   Cough 12/13/2020   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   Allergic rhinitis due to animal (cat) (dog) hair and dander 12/04/2020   Allergic rhinitis due to pollen 12/04/2020   Moderate persistent asthma, uncomplicated 12/04/2020   Radiculopathy, lumbar region 11/19/2020   Spondylolisthesis, lumbar region 11/19/2020   Leg cramping 11/06/2020   Degeneration of lumbar  intervertebral disc 09/27/2020   Degenerative spondylolisthesis 08/15/2020   Pes anserinus bursitis of right knee 05/07/2020   Ulcer of toe, left, limited to breakdown of skin (HCC) 08/31/2019   Diastolic CHF, chronic (HCC) 08/31/2019   PVD (peripheral vascular disease) 08/31/2019   Bilateral carpal tunnel syndrome 08/19/2019   Posterior neck pain 08/19/2019   Vertigo 04/08/2019   Anemia 04/08/2019   Stiffness of left knee 02/10/2019   Failed total knee, left 02/03/2019   Failed total knee, left, initial encounter 02/03/2019   Carpal tunnel syndrome of left wrist 01/21/2019   Pain of left hand 01/21/2019   Preop exam for internal medicine 11/02/2018   Left hand paresthesia  11/02/2018   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 11/01/2018   Hyperopia with presbyopia, bilateral 06/29/2018   Abdominal pain, lower 03/02/2018   Diarrhea 03/02/2018   Encounter for well adult exam with abnormal findings 11/14/2017   Arnold-Chiari malformation (HCC)    Asthma, well controlled    CAD (coronary artery disease)    COPD (chronic obstructive pulmonary disease) (HCC)    DJD (degenerative joint disease)    History of absence seizures    Hx of colonic polyps    Hypercholesteremia    Positive PPD    Venous insufficiency    Pain in left lower leg 07/20/2017   Cellulitis of left lower extremity 06/18/2017   Bilateral lower extremity edema 04/30/2017   Shingles 02/26/2017   COPD exacerbation (HCC) 01/07/2017   S/P TKR (total knee replacement), bilateral 01/07/2017   Right arm pain 05/16/2015   Generalized anxiety disorder 08/31/2014   Varicose veins of bilateral lower extremities with other complications 01/06/2012   Edema 08/07/2011   MENORRHAGIA, POSTMENOPAUSAL 08/13/2010   CIGARETTE SMOKER 06/11/2010   CARDIAC MURMUR 11/02/2009   Bradycardia 11/01/2009   CHEST PAIN 11/01/2009   Allergic rhinitis 04/19/2009   Chronic obstructive airway disease with asthma (HCC) 04/18/2009   LEG CRAMPS, NOCTURNAL 11/02/2008   Essential hypertension 01/17/2008   COLONIC POLYPS 01/14/2008   Body mass index (BMI) 36.0-36.9, adult 01/14/2008   Reflex sympathetic dystrophy 01/14/2008   Coronary atherosclerosis 01/14/2008   LPRD (laryngopharyngeal reflux disease) 01/14/2008   Osteoarthritis 01/14/2008   BACK PAIN, LUMBAR 01/14/2008   SEIZURES, HX OF 01/14/2008    Onset date: Voice: ~3 months ago,                       Swallowing: Beginning of the year.  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 Pt accompanied by: self  PERTINENT HISTORY: (From 08/31/24 by Dr. Okey) [name] is an 84 year old female with a history of hiatal hernia on EGD 20 yrs ago, who presents with difficulty swallowing.   She has been experiencing difficulty swallowing for the past three months, describing it as a sensation of food not wanting to go down, particularly when taking medication. This occurs most of the time but not always. No recent illness preceded the onset of these symptoms.   A swallow study conducted in August was normal, but it only assessed the upper portion of her swallow.  An MRI of the brain was also normal, though there are some changes noted in the cervical spine.   She has a history of reflux and heartburn for which she is taking medication, and she reports that it helps. She has previously seen a GI doctor and underwent an upper endoscopy in 2005, which revealed a hiatal hernia.   She experiences hoarseness from time to time as well.  PAIN:  Are you having pain? Yes: Pain description: Back pain and arthritis pain.  FALLS: Has patient fallen in last 6 months? No, Number of falls: 0  LIVING ENVIRONMENT: Lives with: grandson Lives in: House/apartment  PLOF:Level of assistance: Independent with ADLs Employment: Retired  PATIENT GOALS: To improve voice and reduce perceived swallowing difficulties.  OBJECTIVE:  Note: Objective measures were completed at Evaluation unless otherwise noted.  DIAGNOSTIC FINDINGS: Flexible Fiberoptic Laryngoscopy Procedure Note (From 10/21/24) Pre-Op Diagnosis: hoarseness                                Post Op Diagnosis: same Procedure: Flexible Fiberoptic Laryngoscopy CPT 31575 - Mod 25 Surgeon: Adah Malkin, D.O. 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 It was passed along the floor of the nasal cavity to the nasopharynx. Torus tubarius was patent and the Fossa of Rosenmller was identified. The scope was then flexed caudally and advanced slowly through the nasopharynx, passed through the oropharynx, and down into the hypopharynx. The patient's oro- and nasopharynx were unremarkable with no signs of any gross lesions, edema, masses, or bleeding.  The base of tongue was visualized and no mass, ulceration or lesion was appreciated. The epiglottis did not demonstrate any mass, ulceration or lesion. The vallecula was also assessed with no mass, ulceration or lesion. The patient had good glottic closure upon phonation and no signs of aspiration or pooling of secretions. The patient was asked to inspire and expire with the true vocal folds vibrating normally and without evidence of vocal fold dysfunction. The right true vocal fold was noted to have a polypoid growth at the anterior 1/3 approaching the commissure. The true and false vocal cords, interarytenoid, AE folds, and arytenoids did not demonstrate any significant edema or erythema. The patient was then asked to valsalva, and the pyriform sinuses were assessed which were unremarkable. The airway was patent and there was no evidence of compromise. The scope was then slowly withdrawn from the patient. The patient tolerated the procedure well and there were no complications.  Disposition: Stable  COGNITION: Overall cognitive status: Within functional limits for tasks assessed   SOCIAL HISTORY: Occupation: Retired Water  intake: Five 12 oz/day. Caffeine/alcohol  intake: minimal and pt is currently drinking 1 soda/not consistently.  Daily voice use: moderate  PERCEPTUAL VOICE  ASSESSMENT: Voice quality: hoarse, breathy, rough, strained, and low vocal intensity Vocal abuse: habitual abnormal pitch Resonance: normal Respiratory function: thoracic breathing GRBAS: G-2, R-2, B-2, A-2, S-1  OBJECTIVE VOICE ASSESSMENT: Maximum phonation time for sustained ah: 2.8 seconds Conversational pitch average: 105 Hz Conversational pitch range:  90-130 Hz Conversational loudness average: 71 dB Conversational loudness range: 61-81 dB S/z  ratio: 2.91 (Suggestive of dysfunction >1.0)  PATIENT REPORTED OUTCOME MEASURES (PROM): V-RQOL: 33/50                                                                                                                            TREATMENT DATE:  12/12/24: Pt has been practicing her EMST device at home regularly.  SLP reviewed how to perform EMST at 35, 40, and 55 cm H2O (10x each). Pt performed EMST at 40 cm H2O successfully in 90% of opportunities given frequent min A. When SLP increased the challenge to 55 cm H20, pt performed EMST 10x with mild effort noted by pt. Pt is improving with EMST device. SLP recommended pt to continue home practice at 40 instead of 35 cm H2O. Modified Resonant Voice Therapy (RVT) exercises were completed to address forward resonant voice, reduce laryngeal tension, and improve vocal quality. Pt completed RVT exercises at word and phrase level with /m, v, w, z, j,/ initial sounds. Pt required cues for increasing airflow and forward-focused resonance. Pt completed 25+ repetitions of RVT exercises, achieving forward-focused resonance in 75% of opportunities given frequent min A. SLP updated pt's HEP to include additional RVT words and phrases for practice. Plan is to continue phrase/sentence level RVT and EMST for maximizing pt's voice quality and QOL.    12/05/24: Pt feels that her voice is a little better from previous session. Pt comes in with a slightly more forward-resonant voice compared to previous sessions. Pt's voice  switched more to back-resonance as session went on. Pt brought in her Expiratory Muscle Strength Training device. SLP reviewed how to perform EMST at 35 cm H2O. Pt performed EMST at 35 cm H2O successfully in 90% of opportunities given frequent min A. SLP reviewed resonant voice therapy (RVT) for optimizing pt forward resonance for balanced phonation. Pt said RVT /m/ words, phrases, and sentences with clear voicing on 70% of opportunities given frequent mod to min A. SLP briefly reviewed flow phonation strategies for maximizing pt airflow for phonation. SLP required pt to sustain /s/ and /z/ for demonstrating airflow requirements for clear voicing. Pt benefits from more explicit instruction is respiratory retraining/flow phonation for improving airflow for phonation. Plan is to continue respiratory retraining/EMST/Flow phonation/RVT for decreasing laryngeal hyperfunction for improved pt QOL.   11/28/24: Pt has been practicing her HEP every day since previous session. She notes that she sang in church on Sunday and felt that her voice felt fine. Pt states that her voice sounds a little better this week. SLP reviewed diaphragmatic breathing. Pt demonstrated diaphragmatic breathing in 80% of opportunities during structured voice exercises given occasional min A. SLP provided direct instruction and modeling for completion of resonant voice therapy exercises to encourage forward resonance and decrease tension when voicing. Pt demonstrated forward-resonance with 75% consistency during structured exercises at the word, phrase, and sentence levels with /m/ and /n/ sounds, provided with frequent min-A from SLP. Pt benefitting from cues to increase forward placement  of the voice. SLP introduced Semi-Occluded Vocal Tract Exercises (SOVTEs) (Straw Phonation) as a strategy for increasing consistent airflow for voice production. After SLP guidance, pt successfully completed SOVTEs using a straw with and without water  given  frequent min A for balanced phonation. Pt cleared her throat 4x during session. SLP educated pt about alternative strategies to decrease throat clearing for optimal vocal hygiene (water  sips, hard swallow, shhhhh follwed by a deep breath). Pt is agreeable to trying alternative strategies for decreasing throat-clearing. SLP provided handout related to throat-clearing strategies for improving vocal hygiene. Plan is to continue resonant voice therapy and respiratory muscle strength training for decreasing laryngeal hyperfunction.  11/21/24: Pt has been practicing her HEP every day since previous session. Pt states that her voice sounds a little better this week. SLP reviewed diaphragmatic breathing. Pt demonstrated diaphragmatic breathing in 75% of opportunities during structured voice exercises given occasional min A. SLP provided direct instruction and modeling for completion of resonant voice therapy exercises to encourage forward resonance and decrease tension when voicing. Pt demonstrated forward-resonance with 70% consistency during structured exercises at the word, phrase, and sentence level with /m/ and /n/ sounds, provided with frequent min-A from SLP. Pt benefitting from cues to increase forward placement of the voice. Updated HEP to include /n/ words, phrases, and sentences for continued resonant voice work. Plan is to continue resonant voice therapy and respiratory muscle strength training for decreasing laryngeal hyperfunction.  11/16/24: Pt has been practicing her HEP every day since previous session. Pt is trying to drink 5-6 water  bottles, which is an improvement from prior session. Pt was 65% consistent with performing diaphragmatic breathing given frequent min A. SLP reviewed Resonant Voice Therapy (RVT) for maximizing forward resonance during voice production. During /b/ words, pt was 70% consistent with achieving forward resonance during voice production given frequent min A. SLP challenged pt  to utilize forward resonance during /m/ words. Pt achieved clear voice in 65% of opportunities during /m/ word productions given frequent mod A. Suspect that pt's breath management is a major factor for reduced performance. SLP introduced Respiratory Muscle Strength Training (RMST) as a tool for improving expiratory muscle strength for voice production. SLP began guiding pt through using RMST device; pt displayed emerging understanding of device usage. Pt will need reinforcement in upcoming session for improving understanding and performance with device. Pt is making progress with voice goals. Plan is to continue RVT and RMST for optimizing voice production for QOL.  11/09/24: SLP had pt complete EAT-10 to assess self-perception of swallow ability. Pt notes that swallowing pills and solids is more difficult than liquids. SLP introduced strategies for vocal hygiene (increased water  intake, GERD lifestyle changes). Pt is agreeable to making adjustments to water  intake and utilizing lifestyle changes for GERD to optimize vocal health and wellness. SLP engaged pt in respiratory retraining, focusing on establishing diaphragmatic breath for balance phonation. After SLP guided demonstration, pt was 70% consistent with performing diaphragmatic breathing given frequent min A. SLP reintroduced Resonant Voice Therapy (RVT) for maximizing forward resonance during voice production. During /b/ words, pt was 75% consistent with achieving forward resonance during voice production given frequent min A. Pt is making progress with voice goals. Plan is to continue RVT and introduce vocal function exercises for reducing laryngeal hyperfunction.   10/31/24: Evaluation completed. SLP initiated tx on this date. SLP performed stimulability with pt to measure prognosis with voice therapy exercises. SLP introduced Resonant Voice Therapy (RVT) through /b/ syllables and words. Pt demonstrated ability  to achieve clear voicing using RVT. SLP  educated pt about basic physiology of the voice (respiration, phonation, articulation/resonance) for optimizing pt understanding of voice exercises. Pt demonstrated emerging understanding of physiology of voice; will need reinforcement in future sessions. Plan is to continue RVT and introduced vocal function exercises (VFEs) for maximizing clear voicing for QOL.    PATIENT EDUCATION: Education details: Physiology of voice Person educated: Patient Education method: Explanation Education comprehension: verbalized understanding  HOME EXERCISE PROGRAM: TBD in upcoming sessions.  GOALS: Goals reviewed with patient? Yes  SHORT TERM GOALS: Target date: 12/03/24  Pt will utilize diaphragmatic breathing during structured exercises and conversation in 80% of opportunities given occasional min A. Baseline: Pt uses thoracic breathing pattern. Goal status: INITIAL  2.  Pt will achieve clear voicing during structured exercises and conversation in 80% of opportunities across 2 sessions given occasional min A. Baseline:  Goal status: INITIAL  3.  Pt will describe the basic physiology of the voice (Breathing, Phonation, Artic/Resonance) as related to voice therapy exercises with 90% consistency given rare min A.  Baseline:  Goal status: INITIAL  4.  Pt will describe at least 4 swallow precautions/exercises to given min A.  Baseline:  Goal status: INITIAL  5.  Pt will perform structured, sustained phonation exercises with clear voicing in 80% of opportunities when given occasional min A.  Baseline:  Goal status: INITIAL  6.  Pt will complete PROM for swallowing/reflux. Baseline:  Goal status: MET  LONG TERM GOALS: Target date: 01/09/25  Pt will achieve clear voicing during unstructured conversation in 85% of opportunities given rare min A.  Baseline: Pt voice is dysphonic.  Goal status: INITIAL  2.  Pt will report decreased score on PROM (V-RQOL), displaying self-perceived improvement of  voice. Baseline: 33/50 Goal status: INITIAL  3.  Pt will describe and utilize swallow precautions to minimize self-reported swallowing difficulties.  Baseline:  Goal status: INITIAL   ASSESSMENT:  CLINICAL IMPRESSION: Patient is a 84 y.o. female who was seen today for a behavioral voice evaluation. Pt presents with a moderate dysphonia characterized by rough, breathy, and hoarse vocal quality and 33/50 on V-RQOL scale. According to PROM, pt frequently experiences depressive thoughts and avoids socializing in public due to dysphonia. Pt noted that her dysphonia has affected her ability to sing. Pt was a regular singer up until 3 months ago when her dysphonia started. In addition, pt reported trouble swallowing, specifically with pills. Pt had MBSS recently (07/08/24), with no significant findings. Pt does note hx of reflux and that she currently takes medication for reflux.   OBJECTIVE IMPAIRMENTS: include voice disorder. These impairments are limiting patient from effectively communicating at home and in community. Factors affecting potential to achieve goals and functional outcome are N/A. Patient will benefit from skilled SLP services to address above impairments and improve overall function.  REHAB POTENTIAL: Good  PLAN:  SLP FREQUENCY: 1x/week  SLP DURATION: 10 weeks  PLANNED INTERVENTIONS: Aspiration precaution training, SLP instruction and feedback, Compensatory strategies, and Patient/family education    [employer], M.A., CF-SLP 12/12/2024, 11:36 AM      "

## 2024-12-14 ENCOUNTER — Encounter: Payer: Self-pay | Admitting: Physical Therapy

## 2024-12-14 ENCOUNTER — Ambulatory Visit: Admitting: Physical Therapy

## 2024-12-14 VITALS — BP 108/68 | HR 52

## 2024-12-14 DIAGNOSIS — R2689 Other abnormalities of gait and mobility: Secondary | ICD-10-CM

## 2024-12-14 DIAGNOSIS — M6281 Muscle weakness (generalized): Secondary | ICD-10-CM

## 2024-12-14 NOTE — Therapy (Signed)
 " OUTPATIENT PHYSICAL THERAPY NEURO TREATMENT   Patient Name: MERIE WULF MRN: [id number] DOB:February 01, 1941, 84 y.o., female Today's Date: 12/14/2024  PCP: [name], MD REFERRING PROVIDER: Gregg Lek, MD  END OF SESSION:  PT End of Session - 12/14/24 1102     Visit Number 12    Number of Visits 17   16 plus Eval   Date for Recertification  01/09/25   for scheduling delays   Authorization Type [employer]    PT Start Time 1100    PT Stop Time 1144    PT Time Calculation (min) 44 min    Equipment Utilized During Treatment Gait belt    Activity Tolerance Patient tolerated treatment well    Behavior During Therapy WFL for tasks assessed/performed          Past Medical History:  Diagnosis Date   Allergic rhinitis    Arnold-Chiari malformation (HCC)    Asthma    CAD (coronary artery disease)    COPD (chronic obstructive pulmonary disease) (HCC)    Coughing    coughing since last 01-22-2019 , started on cefdinir bid x10 days, has 3 pills left today , reports she feels mcuh better , still coughing copiuus amonts of thick white sputum , denies fever nor chills, nor body aches .    DJD (degenerative joint disease)    GERD (gastroesophageal reflux disease)    Hiatal hernia    History of absence seizures    last confirmed seizure age 18     Hx of colonic polyps    Hypercholesteremia    Hypertension    Obesity    Positive PPD    Reflex sympathetic dystrophy    Sleep apnea    Varicose veins    Venous insufficiency    Past Surgical History:  Procedure Laterality Date   ABDOMINAL HYSTERECTOMY     CHOLECYSTECTOMY N/A 02/19/2023   Procedure: LAPAROSCOPIC CHOLECYSTECTOMY WITH INTRAOPERATIVE CHOLANGIOGRAM AND ICG DYE;  Surgeon: Tanda Locus, MD;  Location: [facility] OR;  Service: General;  Laterality: N/A;   cspine surgery  12/02/1991   for arnold-chiari malformation   IR BONE MARROW BIOPSY & ASPIRATION  01/28/2023   JOINT REPLACEMENT  06/05/2011   Left total knee  replacement   right knee arthroscopy  12/02/2007   Dr. Duwayne   right total knee replacement  05/31/2008   Dr. Duwayne   TONSILLECTOMY     TOTAL KNEE REVISION Left 02/03/2019   Procedure: LEFT TOTAL KNEE REVISION;  Surgeon: Fidel Rogue, MD;  Location: [facility] ORS;  Service: Orthopedics;  Laterality: Left;   Patient Active Problem List   Diagnosis Date Noted   Seasonal and perennial allergic rhinitis 07/18/2024   History of smoking 25-50 pack years 07/18/2024   Painful swallowing 07/18/2024   Abnormal CT scan 12/19/2022   Pain of upper abdomen 12/19/2022   Dilation of biliary tract 12/16/2022   Cellulitis of right leg 06/18/2022   Hyperglycemia 06/18/2022   Laceration of right index finger 05/08/2022   Acquired hallux valgus of left foot 11/22/2021   Primary localized osteoarthrosis of ankle and foot 11/22/2021   AKI (acute kidney injury) 03/13/2021   Memory loss 03/13/2021   Anxiety 03/13/2021   Hypokalemia 03/05/2021   Generalized weakness 03/04/2021   Dizziness 03/04/2021   Vitamin D  deficiency 01/16/2021   Cough 12/13/2020   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   Allergic rhinitis due to animal (cat) (dog) hair and dander 12/04/2020   Allergic rhinitis due to pollen 12/04/2020   Moderate  persistent asthma, uncomplicated 12/04/2020   Radiculopathy, lumbar region 11/19/2020   Spondylolisthesis, lumbar region 11/19/2020   Leg cramping 11/06/2020   Degeneration of lumbar intervertebral disc 09/27/2020   Degenerative spondylolisthesis 08/15/2020   Pes anserinus bursitis of right knee 05/07/2020   Ulcer of toe, left, limited to breakdown of skin (HCC) 08/31/2019   Diastolic CHF, chronic (HCC) 08/31/2019   PVD (peripheral vascular disease) 08/31/2019   Bilateral carpal tunnel syndrome 08/19/2019   Posterior neck pain 08/19/2019   Vertigo 04/08/2019   Anemia 04/08/2019   Stiffness of left knee 02/10/2019   Failed total knee, left 02/03/2019   Failed total knee, left, initial encounter  02/03/2019   Carpal tunnel syndrome of left wrist 01/21/2019   Pain of left hand 01/21/2019   Preop exam for internal medicine 11/02/2018   Left hand paresthesia 11/02/2018   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 11/01/2018   Hyperopia with presbyopia, bilateral 06/29/2018   Abdominal pain, lower 03/02/2018   Diarrhea 03/02/2018   Encounter for well adult exam with abnormal findings 11/14/2017   Arnold-Chiari malformation (HCC)    Asthma, well controlled    CAD (coronary artery disease)    COPD (chronic obstructive pulmonary disease) (HCC)    DJD (degenerative joint disease)    History of absence seizures    Hx of colonic polyps    Hypercholesteremia    Positive PPD    Venous insufficiency    Pain in left lower leg 07/20/2017   Cellulitis of left lower extremity 06/18/2017   Bilateral lower extremity edema 04/30/2017   Shingles 02/26/2017   COPD exacerbation (HCC) 01/07/2017   S/P TKR (total knee replacement), bilateral 01/07/2017   Right arm pain 05/16/2015   Generalized anxiety disorder 08/31/2014   Varicose veins of bilateral lower extremities with other complications 01/06/2012   Edema 08/07/2011   MENORRHAGIA, POSTMENOPAUSAL 08/13/2010   CIGARETTE SMOKER 06/11/2010   CARDIAC MURMUR 11/02/2009   Bradycardia 11/01/2009   CHEST PAIN 11/01/2009   Allergic rhinitis 04/19/2009   Chronic obstructive airway disease with asthma (HCC) 04/18/2009   LEG CRAMPS, NOCTURNAL 11/02/2008   Essential hypertension 01/17/2008   COLONIC POLYPS 01/14/2008   Body mass index (BMI) 36.0-36.9, adult 01/14/2008   Reflex sympathetic dystrophy 01/14/2008   Coronary atherosclerosis 01/14/2008   LPRD (laryngopharyngeal reflux disease) 01/14/2008   Osteoarthritis 01/14/2008   BACK PAIN, LUMBAR 01/14/2008   SEIZURES, HX OF 01/14/2008    ONSET DATE: 09/30/2024 (date of referral)  REFERRING DIAG: R53.1 (ICD-10-CM) - Weakness M54.2 (ICD-10-CM) - Cervicalgia  THERAPY DIAG:  Other  abnormalities of gait and mobility  Muscle weakness (generalized)  Rationale for Evaluation and Treatment: Rehabilitation  SUBJECTIVE:  Likes to be called [name]  SUBJECTIVE STATEMENT: No recent falls.  Caught R pinky finger on door at cousins house in hallway 2 days ago--pain improving.  No other acute changes.  Pt arrived ambulating with RW. Pt accompanied by: self  PERTINENT HISTORY: Pt with tremors and weakness (noted back in June; went to ED; MRI brain did not show acute abnormalities).  Difficulty eating and holding objects; difficulty writing; tremors worsen during the day.  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  Per PT referral: Cervicalgia. Left side weakness. 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  PAIN: 6/10 stomach pain (h/o having on/off); R pinky pain (lateral when bending finger) 6/10 Are you having pain? Yes: NPRS scale: 0/10 Pain location: low back Pain description: sharp shooting; aching Aggravating factors: standing a lot Relieving factors: pain patches; medication cream  PRECAUTIONS: Fall; Swallowing difficulties (with pills and certain foods; not liquids/water )  RED FLAGS: None   WEIGHT BEARING RESTRICTIONS: No  FALLS: Has patient fallen in last 6 months? No  LIVING ENVIRONMENT: Lives with: lives with their family (grandson) Lives in: House/apartment Stairs: Yes: External: 5 steps; on right going up, on left going up, and can reach both Has following equipment at home: Single point cane and Walker - 2 wheeled  PLOF:  Ambulates with RW (last 6 months or more).  Participated in OP PT first part of this year.  Was doing water  aerobics at [facility] (hasn't done in last 5 months d/t stomach acting up).  PATIENT GOALS: Try to get strength back and do some of the things she was able to do before.  OBJECTIVE:  Note: Objective measures were completed at Evaluation unless otherwise noted.  DIAGNOSTIC FINDINGS:  MRI C-spine 09/26/24: - Multilevel spondylosis, disc bulging, reversal of normal cervical curvature and grade 2 anterior spondylolisthesis of C2 and C3 measuring 7 mm. - At C6-7 disc bulging and facet hypertrophy with moderate to severe spinal stenosis and severe bilateral foraminal stenosis; no cord signal abnormalities. - At C2-3 pseudo disc bulging and facet hypertrophy with moderate spinal stenosis and severe bilateral foraminal stenosis; no cord signal abnormalities. - At C3-4 disc bulging with mild spinal stenosis and severe bilateral foraminal stenosis; no cord signal abnormalities.  COGNITION: Overall cognitive status: Within functional limits for tasks assessed   SENSATION: Decreased light touch sensation L lateral thigh, medial lower leg, and entire foot  COORDINATION: Decreased alternating toe taps L LE compared to R LE  EDEMA: (chronic L>R LE per pt report) Circumferential: malleolar line 27.5 cm R ankle; 28 cm L ankle  POSTURE: rounded shoulders and forward head  LOWER EXTREMITY MMT:    MMT Right Eval Left Eval  Hip flexion 4+/5 4+/5  Hip extension    Hip abduction    Hip adduction    Hip internal rotation    Hip external rotation    Knee flexion    Knee extension 4+/5 4+/5  Ankle dorsiflexion 4+/5 2/5  Ankle plantarflexion At least 3/5 AROM At least 2/5 AROM  Ankle inversion    Ankle eversion    (Blank rows = not tested)  TRANSFERS: Sit to stand: Modified independence  Assistive device utilized: [employer] - 2 wheeled     Stand to sit: Modified independence  Assistive device  utilized: Environmental Consultant - 2 wheeled  GAIT: Findings: Gait Characteristics: narrow BOS; R LE ER>L LE; foot flat R LE; decreased L LE DF with forefoot initial WB'ing during initial contact L LE; increased L trunk lean; short steps, Distance walked: clinic distances, Assistive device utilized:Walker - 2 wheeled, Level of assistance: Modified independence, and Comments: decreased gait speed  FUNCTIONAL TESTS:  5 times sit to stand: 49.78 seconds with B UE support (up to RW); 35.47 seconds 11/28/24 10 meter walk test: 0.31 m/sec (31.63 seconds with RW use); 0.51 m/sec (19.35 seconds) with RW 12/07/24 BERG Balance Test: 22/56 Item T/est date: 11/07/24 Date:  Date:   Sitting to standing 3. able to stand independently using hands Insert SmartPhrase OPRCBERGREEVAL Insert SmartPhrase OPRCBERGREEVAL  2. Standing unsupported 3. able to stand 2 minutes with supervision    3. Sitting with back unsupported, feet supported 4. able to sit safely and securely for 2 minutes    4. Standing to sitting 3. controls descent by using hands    5. Pivot transfer  3. able to transfer safely with definite need of hands    6. Standing unsupported with eyes closed 3. able to stand 10 seconds with supervision    7. Standing unsupported with feet together 1. needs help to attain position but able to stand 15 seconds feet together    8. Reaching forward with outstretched arms while standing 1. reaches forward but needs supervision    9. Pick up object from the floor from standing 0. unable to try/needs assistance to keep from losing balance or falling; needs assist for balance    10. Turning to look behind over left and right shoulders while standing 1. needs supervision when turning    11. Turn 360 degrees 0. needs assistance while turning    12. Place alternate foot on step or stool while standing unsupported 0. needs assistance to keep from falling/unable to try; assist to keep balance with 2 attempts    13. Standing unsupported  one foot in front 0. loses balance while stepping or standing    14. Standing on one leg 0. unable to try of needs assist to prevent fall ; need assist to maintain balance     Total Score 22/56 Total Score:    Total Score:      PATIENT SURVEYS:  TBA                                                                                                                             TREATMENT DATE: 12/14/24  Self Care: BP and HR taken in sitting at rest beginning of session (see below for details). Vitals:   12/14/24 1108  BP: 108/68  Pulse: (!) 52   Gait training:  x115 feet x2 with SPC Notes: CGA; partial step through gait pattern; appearing more cautious with decreased gait speed 1st trial; SPC height adjusted for 2nd trial and improved gait speed and confidence noted; no loss of balance noted Ambulation in // bars (  no UE support): Forward x4 reps (8 feet each rep): Notes: CGA; partial step through gait pattern; appearing more cautious Retro x4 reps (8 feet each rep) Notes: small steps B LE's; increased time to perform (pt appearing more cautious walking backwards); CGA  Therapeutic Exercise (to update HEP): Standing March with Counter Support  x10 reps B LE's Standing Hip Abduction with Counter Support  x10 reps B LE's Heel Raises with Counter Support  x10 reps B LE's simultaneously Standing Tandem Balance with Counter Support  x2 reps B LE's with 15 second hold  PATIENT EDUCATION: Education details: Contact PCP if R pinky pain does not continue to improve.  Updated HEP. Person educated: Patient Education method: Explanation, Demonstration, Verbal cues, and Handouts Education comprehension: verbalized understanding and needs further education  HOME EXERCISE PROGRAM: Access Code: [email or web address] URL: [email or web address] Date: 12/14/2024 Prepared by: Damien Caulk  Exercises - Seated March  - 1 x daily - 5 x weekly - 1-2 sets - 10 reps - Seated Long Arc Quad  - 1 x  daily - 5 x weekly - 1-2 sets - 10 reps - Seated Hip Abduction with Resistance  - 1 x daily - 5 x weekly - 1-2 sets - 10 reps - Seated Heel Raise  - 1 x daily - 5 x weekly - 1-2 sets - 10 reps - Seated Toe Raise  - 1 x daily - 5 x weekly - 1-2 sets - 10 reps - Standing March with Counter Support  - 1 x daily - 3-5 x weekly - 2-3 sets - 10 reps - Standing Hip Abduction with Counter Support  - 1 x daily - 3-5 x weekly - 2-3 sets - 10 reps - Heel Raises with Counter Support  - 1 x daily - 3-5 x weekly - 2-3 sets - 10 reps - Standing Tandem Balance with Counter Support  - 1 x daily - 3-5 x weekly - 1 sets - 2-3 reps - 15-30 seconds hold  GOALS: Goals reviewed with patient? Yes  SHORT TERM GOALS: Target date: 11/28/2024  Pt will be independent with initial HEP in order to improve strength and balance in order to decrease fall risk and improve function at home for ADL's. Baseline: TBA Goal status: MET  2.  Assess BERG Balance Test and update LTG as needed. Baseline: 22/56 11/07/24 Goal status: MET  3.  Pt will decrease 5 Time Sit to Stand by at least 10 seconds in order to demonstrate clinically significant improvement in LE strength. Baseline: 49.78 seconds (Eval); 35.47 seconds 11/28/24 Goal status: MET   LONG TERM GOALS: Target date: 12/26/2024  Pt will be independent with HEP in order to improve strength and balance in order to decrease fall risk and improve function at home for ADL's. Baseline: TBA Goal status: INITIAL  2.  Patient will demonstrate an improved Berg Balance Score of 40 or greater as to demonstrate improved balance with ADLs and reduced fall risk. Baseline: 22/56 11/07/24 Goal status: REVISED  3.  Pt will decrease 5 Time Sit to Stand by at least 20 seconds in order to demonstrate clinically significant improvement in LE strength. Baseline: 49.78 seconds (Eval); 35.47 seconds 11/28/24 Goal status: INITIAL  4.  Pt will increase by at least 0.13 m/s in order to  demonstrate clinically significant improvement in community ambulation.  Baseline: 0.31 m/sec on Eval; 0.51 m/sec 12/07/24 Goal status: MET  5.  Pt will demonstrate improved L LE foot clearance for safety with gait. Baseline: Decreased  L foot clearance with RW use Goal status: INITIAL  ASSESSMENT:  CLINICAL IMPRESSION: Patient was seen today for physical therapy treatment to address gait and strength.  Focused session on gait training with SPC and no UE support and also updating pt's HEP with standing ex's with counter support.  Pt appears safe to ambulate within home with [facility] use but recommend use of RW outside of home/in community.  Pt demonstrates appropriate understanding of updated HEP and pt issued updated written HEP handout.  They would continue to benefit from skilled PT to address impairments as noted and progress towards long term goals.  OBJECTIVE IMPAIRMENTS: Abnormal gait, cardiopulmonary status limiting activity, decreased activity tolerance, decreased balance, decreased coordination, decreased endurance, decreased knowledge of condition, decreased knowledge of use of DME, decreased mobility, difficulty walking, decreased ROM, decreased strength, increased edema, impaired flexibility, impaired sensation, improper body mechanics, postural dysfunction, and pain.   ACTIVITY LIMITATIONS: carrying, lifting, bending, standing, squatting, stairs, transfers, bed mobility, continence, toileting, dressing, locomotion level, and caring for others  PARTICIPATION LIMITATIONS: meal prep, cleaning, laundry, shopping, community activity, and yard work  PERSONAL FACTORS: Age, Fitness, Past/current experiences, and 1-2 comorbidities: COPD, HFpEF, bradycardia, and memory loss are also affecting patient's functional outcome.   REHAB POTENTIAL: Good  CLINICAL DECISION MAKING: Evolving/moderate complexity  EVALUATION COMPLEXITY: Moderate  PLAN:  PT FREQUENCY: 2x/week  PT DURATION: 8  weeks  PLANNED INTERVENTIONS: 97164- PT Re-evaluation, 97750- Physical Performance Testing, 97110-Therapeutic exercises, 97530- Therapeutic activity, [id number]- Neuromuscular re-education, 97535- Self Care, [id number]- Manual therapy, [id number]- Gait training, [phone]- Orthotic Initial, [phone]- Orthotic/Prosthetic subsequent, [phone]- Aquatic Therapy, [phone]- Electrical stimulation (manual), [id number]- Ultrasound, [id number]- Traction (mechanical), [phone] (1-2 muscles), [id number] (3+ muscles)- Dry Needling, Patient/Family education, Balance training, Stair training, Taping, Joint mobilization, Spinal mobilization, Compression bandaging, Cognitive remediation, DME instructions, Cryotherapy, Moist heat, and Biofeedback  PLAN FOR NEXT SESSION: Check on updated HEP.  Gait training with SPC use (pt reporting goal to transition off walker).  Balance, strengthening, aerobic endurance; coordination.   Damien Caulk, PT 12/14/2024, 11:59 AM        "

## 2024-12-19 ENCOUNTER — Ambulatory Visit: Admitting: Physical Therapy

## 2024-12-19 ENCOUNTER — Encounter: Payer: Self-pay | Admitting: Physical Therapy

## 2024-12-19 ENCOUNTER — Ambulatory Visit

## 2024-12-19 VITALS — BP 132/71 | HR 50

## 2024-12-19 DIAGNOSIS — R2689 Other abnormalities of gait and mobility: Secondary | ICD-10-CM

## 2024-12-19 DIAGNOSIS — R490 Dysphonia: Secondary | ICD-10-CM

## 2024-12-19 DIAGNOSIS — M6281 Muscle weakness (generalized): Secondary | ICD-10-CM

## 2024-12-19 NOTE — Therapy (Signed)
 " OUTPATIENT PHYSICAL THERAPY NEURO TREATMENT   Patient Name: [name] MRN: [id number] DOB:10-Jun-1941, 84 y.o., female Today's Date: 12/19/2024  PCP: [name], MD REFERRING PROVIDER: Gregg Lek, MD  END OF SESSION:  PT End of Session - 12/19/24 1105     Visit Number 13    Number of Visits 17   16 plus Eval   Date for Recertification  01/09/25   for scheduling delays   Authorization Type [employer]    PT Start Time 1103    PT Stop Time 1144    PT Time Calculation (min) 41 min    Equipment Utilized During Treatment Gait belt    Activity Tolerance Patient tolerated treatment well    Behavior During Therapy WFL for tasks assessed/performed          Past Medical History:  Diagnosis Date   Allergic rhinitis    Arnold-Chiari malformation (HCC)    Asthma    CAD (coronary artery disease)    COPD (chronic obstructive pulmonary disease) (HCC)    Coughing    coughing since last 01-22-2019 , started on cefdinir bid x10 days, has 3 pills left today , reports she feels mcuh better , still coughing copiuus amonts of thick white sputum , denies fever nor chills, nor body aches .    DJD (degenerative joint disease)    GERD (gastroesophageal reflux disease)    Hiatal hernia    History of absence seizures    last confirmed seizure age 87     Hx of colonic polyps    Hypercholesteremia    Hypertension    Obesity    Positive PPD    Reflex sympathetic dystrophy    Sleep apnea    Varicose veins    Venous insufficiency    Past Surgical History:  Procedure Laterality Date   ABDOMINAL HYSTERECTOMY     CHOLECYSTECTOMY N/A 02/19/2023   Procedure: LAPAROSCOPIC CHOLECYSTECTOMY WITH INTRAOPERATIVE CHOLANGIOGRAM AND ICG DYE;  Surgeon: Tanda Locus, MD;  Location: [facility] OR;  Service: General;  Laterality: N/A;   cspine surgery  12/02/1991   for arnold-chiari malformation   IR BONE MARROW BIOPSY & ASPIRATION  01/28/2023   JOINT REPLACEMENT  06/05/2011   Left total knee  replacement   right knee arthroscopy  12/02/2007   Dr. Duwayne   right total knee replacement  05/31/2008   Dr. Duwayne   TONSILLECTOMY     TOTAL KNEE REVISION Left 02/03/2019   Procedure: LEFT TOTAL KNEE REVISION;  Surgeon: Fidel Rogue, MD;  Location: [facility] ORS;  Service: Orthopedics;  Laterality: Left;   Patient Active Problem List   Diagnosis Date Noted   Seasonal and perennial allergic rhinitis 07/18/2024   History of smoking 25-50 pack years 07/18/2024   Painful swallowing 07/18/2024   Abnormal CT scan 12/19/2022   Pain of upper abdomen 12/19/2022   Dilation of biliary tract 12/16/2022   Cellulitis of right leg 06/18/2022   Hyperglycemia 06/18/2022   Laceration of right index finger 05/08/2022   Acquired hallux valgus of left foot 11/22/2021   Primary localized osteoarthrosis of ankle and foot 11/22/2021   AKI (acute kidney injury) 03/13/2021   Memory loss 03/13/2021   Anxiety 03/13/2021   Hypokalemia 03/05/2021   Generalized weakness 03/04/2021   Dizziness 03/04/2021   Vitamin D  deficiency 01/16/2021   Cough 12/13/2020   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   Allergic rhinitis due to animal (cat) (dog) hair and dander 12/04/2020   Allergic rhinitis due to pollen 12/04/2020   Moderate  persistent asthma, uncomplicated 12/04/2020   Radiculopathy, lumbar region 11/19/2020   Spondylolisthesis, lumbar region 11/19/2020   Leg cramping 11/06/2020   Degeneration of lumbar intervertebral disc 09/27/2020   Degenerative spondylolisthesis 08/15/2020   Pes anserinus bursitis of right knee 05/07/2020   Ulcer of toe, left, limited to breakdown of skin (HCC) 08/31/2019   Diastolic CHF, chronic (HCC) 08/31/2019   PVD (peripheral vascular disease) 08/31/2019   Bilateral carpal tunnel syndrome 08/19/2019   Posterior neck pain 08/19/2019   Vertigo 04/08/2019   Anemia 04/08/2019   Stiffness of left knee 02/10/2019   Failed total knee, left 02/03/2019   Failed total knee, left, initial encounter  02/03/2019   Carpal tunnel syndrome of left wrist 01/21/2019   Pain of left hand 01/21/2019   Preop exam for internal medicine 11/02/2018   Left hand paresthesia 11/02/2018   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 11/01/2018   Hyperopia with presbyopia, bilateral 06/29/2018   Abdominal pain, lower 03/02/2018   Diarrhea 03/02/2018   Encounter for well adult exam with abnormal findings 11/14/2017   Arnold-Chiari malformation (HCC)    Asthma, well controlled    CAD (coronary artery disease)    COPD (chronic obstructive pulmonary disease) (HCC)    DJD (degenerative joint disease)    History of absence seizures    Hx of colonic polyps    Hypercholesteremia    Positive PPD    Venous insufficiency    Pain in left lower leg 07/20/2017   Cellulitis of left lower extremity 06/18/2017   Bilateral lower extremity edema 04/30/2017   Shingles 02/26/2017   COPD exacerbation (HCC) 01/07/2017   S/P TKR (total knee replacement), bilateral 01/07/2017   Right arm pain 05/16/2015   Generalized anxiety disorder 08/31/2014   Varicose veins of bilateral lower extremities with other complications 01/06/2012   Edema 08/07/2011   MENORRHAGIA, POSTMENOPAUSAL 08/13/2010   CIGARETTE SMOKER 06/11/2010   CARDIAC MURMUR 11/02/2009   Bradycardia 11/01/2009   CHEST PAIN 11/01/2009   Allergic rhinitis 04/19/2009   Chronic obstructive airway disease with asthma (HCC) 04/18/2009   LEG CRAMPS, NOCTURNAL 11/02/2008   Essential hypertension 01/17/2008   COLONIC POLYPS 01/14/2008   Body mass index (BMI) 36.0-36.9, adult 01/14/2008   Reflex sympathetic dystrophy 01/14/2008   Coronary atherosclerosis 01/14/2008   LPRD (laryngopharyngeal reflux disease) 01/14/2008   Osteoarthritis 01/14/2008   BACK PAIN, LUMBAR 01/14/2008   SEIZURES, HX OF 01/14/2008    ONSET DATE: 09/30/2024 (date of referral)  REFERRING DIAG: R53.1 (ICD-10-CM) - Weakness M54.2 (ICD-10-CM) - Cervicalgia  THERAPY DIAG:  Other  abnormalities of gait and mobility  Muscle weakness (generalized)  Rationale for Evaluation and Treatment: Rehabilitation  SUBJECTIVE:  Likes to be called [name]  SUBJECTIVE STATEMENT: Pt reports R pinky pain 5/10 currently (pt reports having PCP visit soon and will address it at that time); can move it but is just sore (no issues reported during sessions activities).  No recent falls.  Pt arrived after SLP therapy.  No acute changes.  Did updated HEP--no issues/concerns reported. Pt accompanied by: self  PERTINENT HISTORY: Pt with tremors and weakness (noted back in June; went to ED; MRI brain did not show acute abnormalities).  Difficulty eating and holding objects; difficulty writing; tremors worsen during the day.  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  Per PT referral: Cervicalgia. Left side weakness. 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  PAIN: 6/10 stomach pain (h/o having on/off); R pinky pain (lateral when bending finger) 5/10 Are you having pain? Yes: NPRS scale: 0/10 Pain location: low back Pain description: sharp shooting; aching Aggravating factors: standing a lot Relieving factors: pain patches; medication cream  PRECAUTIONS: Fall; Swallowing difficulties (with pills and certain foods; not liquids/water )  RED FLAGS: None   WEIGHT BEARING RESTRICTIONS: No  FALLS: Has patient fallen in last 6 months? No  LIVING ENVIRONMENT: Lives with: lives with their family (grandson) Lives in: House/apartment Stairs: Yes: External: 5 steps;  on right going up, on left going up, and can reach both Has following equipment at home: Single point cane and Walker - 2 wheeled  PLOF: Ambulates with RW (last 6 months or more).  Participated in OP PT first part of this year.  Was doing water  aerobics [facility] (hasn't done in last 5 months d/t stomach acting up).  PATIENT GOALS: Try to get strength back and do some of the things she was able to do before.  OBJECTIVE:  Note: Objective measures were completed at Evaluation unless otherwise noted.  DIAGNOSTIC FINDINGS:  MRI C-spine 09/26/24: - Multilevel spondylosis, disc bulging, reversal of normal cervical curvature and grade 2 anterior spondylolisthesis of C2 and C3 measuring 7 mm. - At C6-7 disc bulging and facet hypertrophy with moderate to severe spinal stenosis and severe bilateral foraminal stenosis; no cord signal abnormalities. - At C2-3 pseudo disc bulging and facet hypertrophy with moderate spinal stenosis and severe bilateral foraminal stenosis; no cord signal abnormalities. - At C3-4 disc bulging with mild spinal stenosis and severe bilateral foraminal stenosis; no cord signal abnormalities.  COGNITION: Overall cognitive status: Within functional limits for tasks assessed   SENSATION: Decreased light touch sensation L lateral thigh, medial lower leg, and entire foot  COORDINATION: Decreased alternating toe taps L LE compared to R LE  EDEMA: (chronic L>R LE per pt report) Circumferential: malleolar line 27.5 cm R ankle; 28 cm L ankle  POSTURE: rounded shoulders and forward head  LOWER EXTREMITY MMT:    MMT Right Eval Left Eval  Hip flexion 4+/5 4+/5  Hip extension    Hip abduction    Hip adduction    Hip internal rotation    Hip external rotation    Knee flexion    Knee extension 4+/5 4+/5  Ankle dorsiflexion 4+/5 2/5  Ankle plantarflexion At least 3/5 AROM At least 2/5 AROM  Ankle inversion    Ankle eversion    (Blank rows = not tested)  TRANSFERS: Sit  to stand: Modified independence  Assistive  device utilized: [employer] - 2 wheeled     Stand to sit: Modified independence  Assistive device utilized: Environmental Consultant - 2 wheeled      GAIT: Findings: Gait Characteristics: narrow BOS; R LE ER>L LE; foot flat R LE; decreased L LE DF with forefoot initial WB'ing during initial contact L LE; increased L trunk lean; short steps, Distance walked: clinic distances, Assistive device utilized:Walker - 2 wheeled, Level of assistance: Modified independence, and Comments: decreased gait speed  FUNCTIONAL TESTS:  5 times sit to stand: 49.78 seconds with B UE support (up to RW); 35.47 seconds 11/28/24 10 meter walk test: 0.31 m/sec (31.63 seconds with RW use); 0.51 m/sec (19.35 seconds) with RW 12/07/24 BERG Balance Test: 22/56 Item T/est date: 11/07/24 Date:  Date:   Sitting to standing 3. able to stand independently using hands Insert SmartPhrase OPRCBERGREEVAL Insert SmartPhrase OPRCBERGREEVAL  2. Standing unsupported 3. able to stand 2 minutes with supervision    3. Sitting with back unsupported, feet supported 4. able to sit safely and securely for 2 minutes    4. Standing to sitting 3. controls descent by using hands    5. Pivot transfer  3. able to transfer safely with definite need of hands    6. Standing unsupported with eyes closed 3. able to stand 10 seconds with supervision    7. Standing unsupported with feet together 1. needs help to attain position but able to stand 15 seconds feet together    8. Reaching forward with outstretched arms while standing 1. reaches forward but needs supervision    9. Pick up object from the floor from standing 0. unable to try/needs assistance to keep from losing balance or falling; needs assist for balance    10. Turning to look behind over left and right shoulders while standing 1. needs supervision when turning    11. Turn 360 degrees 0. needs assistance while turning    12. Place alternate foot on step or stool while standing  unsupported 0. needs assistance to keep from falling/unable to try; assist to keep balance with 2 attempts    13. Standing unsupported one foot in front 0. loses balance while stepping or standing    14. Standing on one leg 0. unable to try of needs assist to prevent fall ; need assist to maintain balance     Total Score 22/56 Total Score:    Total Score:      PATIENT SURVEYS:  TBA                                                                                                                             TREATMENT DATE: 12/19/24  Self Care: BP and HR taken in sitting at rest beginning of session (see below for details). Vitals:   12/19/24 1108  BP: 132/71  Pulse: (!) 50   Therapeutic Exercise: SciFit multi-peaks up to level 4 for 8 minutes using BUE/BLEs for neural priming for reciprocal  movement, dynamic cardiovascular warmup and increased amplitude of stepping. RPE of 8/10 following activity.  Average stride length 7.6 inches.   Therapeutic Activity: Activities in // bars: Toe taps to 6 inch step (no UE support) to improve single leg stability/balance:  x10 reps B LE's x3 sets Notes: pt fatigued with activity; HR 64 bpm post activity Sit to stands from mat table (holding 2# dumbbell chest level with B hands) to improve ease of transfers: x5 reps x3 sets  PATIENT EDUCATION: Education details: Follow-up with PCP regarding R pinky pain (pt reports having visit soon and will address at that time).  Continue HEP. Person educated: Patient Education method: Explanation, Demonstration, Verbal cues, and Handouts Education comprehension: verbalized understanding and needs further education  HOME EXERCISE PROGRAM: Access Code: [email or web address] URL: [email or web address] Date: 12/14/2024 Prepared by: Damien Caulk  Exercises - Seated March  - 1 x daily - 5 x weekly - 1-2 sets - 10 reps - Seated Long Arc Quad  - 1 x daily - 5 x weekly - 1-2 sets - 10 reps - Seated Hip  Abduction with Resistance  - 1 x daily - 5 x weekly - 1-2 sets - 10 reps - Seated Heel Raise  - 1 x daily - 5 x weekly - 1-2 sets - 10 reps - Seated Toe Raise  - 1 x daily - 5 x weekly - 1-2 sets - 10 reps - Standing March with Counter Support  - 1 x daily - 3-5 x weekly - 2-3 sets - 10 reps - Standing Hip Abduction with Counter Support  - 1 x daily - 3-5 x weekly - 2-3 sets - 10 reps - Heel Raises with Counter Support  - 1 x daily - 3-5 x weekly - 2-3 sets - 10 reps - Standing Tandem Balance with Counter Support  - 1 x daily - 3-5 x weekly - 1 sets - 2-3 reps - 15-30 seconds hold  GOALS: Goals reviewed with patient? Yes  SHORT TERM GOALS: Target date: 11/28/2024  Pt will be independent with initial HEP in order to improve strength and balance in order to decrease fall risk and improve function at home for ADL's. Baseline: TBA Goal status: MET  2.  Assess BERG Balance Test and update LTG as needed. Baseline: 22/56 11/07/24 Goal status: MET  3.  Pt will decrease 5 Time Sit to Stand by at least 10 seconds in order to demonstrate clinically significant improvement in LE strength. Baseline: 49.78 seconds (Eval); 35.47 seconds 11/28/24 Goal status: MET   LONG TERM GOALS: Target date: 12/26/2024  Pt will be independent with HEP in order to improve strength and balance in order to decrease fall risk and improve function at home for ADL's. Baseline: TBA Goal status: INITIAL  2.  Patient will demonstrate an improved Berg Balance Score of 40 or greater as to demonstrate improved balance with ADLs and reduced fall risk. Baseline: 22/56 11/07/24 Goal status: REVISED  3.  Pt will decrease 5 Time Sit to Stand by at least 20 seconds in order to demonstrate clinically significant improvement in LE strength. Baseline: 49.78 seconds (Eval); 35.47 seconds 11/28/24 Goal status: INITIAL  4.  Pt will increase by at least 0.13 m/s in order to demonstrate clinically significant improvement in  community ambulation.  Baseline: 0.31 m/sec on Eval; 0.51 m/sec 12/07/24 Goal status: MET  5.  Pt will demonstrate improved L LE foot clearance for safety with gait. Baseline: Decreased L foot clearance with RW use Goal  status: INITIAL  ASSESSMENT:  CLINICAL IMPRESSION: Patient was seen today for physical therapy treatment to address strength, transfers, and balance.  Focused session on global strengthening, improving ease of transfers, and single leg balance/stability.  Patient continues to be limited by strength and balance.  They demonstrate improvement in single leg balance/stability with toe taps to 6 inch step.  They would continue to benefit from skilled PT to address impairments as noted and progress towards long term goals.  OBJECTIVE IMPAIRMENTS: Abnormal gait, cardiopulmonary status limiting activity, decreased activity tolerance, decreased balance, decreased coordination, decreased endurance, decreased knowledge of condition, decreased knowledge of use of DME, decreased mobility, difficulty walking, decreased ROM, decreased strength, increased edema, impaired flexibility, impaired sensation, improper body mechanics, postural dysfunction, and pain.   ACTIVITY LIMITATIONS: carrying, lifting, bending, standing, squatting, stairs, transfers, bed mobility, continence, toileting, dressing, locomotion level, and caring for others  PARTICIPATION LIMITATIONS: meal prep, cleaning, laundry, shopping, community activity, and yard work  PERSONAL FACTORS: Age, Fitness, Past/current experiences, and 1-2 comorbidities: COPD, HFpEF, bradycardia, and memory loss are also affecting patient's functional outcome.   REHAB POTENTIAL: Good  CLINICAL DECISION MAKING: Evolving/moderate complexity  EVALUATION COMPLEXITY: Moderate  PLAN:  PT FREQUENCY: 2x/week  PT DURATION: 8 weeks  PLANNED INTERVENTIONS: 97164- PT Re-evaluation, 97750- Physical Performance Testing, 97110-Therapeutic exercises, 97530-  Therapeutic activity, [id number]- Neuromuscular re-education, 97535- Self Care, [id number]- Manual therapy, [id number]- Gait training, [phone]- Orthotic Initial, [phone]- Orthotic/Prosthetic subsequent, [phone]- Aquatic Therapy, [phone]- Electrical stimulation (manual), [id number]- Ultrasound, [id number]- Traction (mechanical), [phone] (1-2 muscles), [id number] (3+ muscles)- Dry Needling, Patient/Family education, Balance training, Stair training, Taping, Joint mobilization, Spinal mobilization, Compression bandaging, Cognitive remediation, DME instructions, Cryotherapy, Moist heat, and Biofeedback  PLAN FOR NEXT SESSION: Check on updated HEP.  Gait training with SPC use (pt reporting goal to transition off walker).  Balance, strengthening, aerobic endurance; coordination.   Damien Caulk, PT 12/19/2024, 12:31 PM        "

## 2024-12-19 NOTE — Therapy (Signed)
 " OUTPATIENT SPEECH LANGUAGE PATHOLOGY VOICE TREATMENT   Patient Name: [name] MRN: [id number] DOB:03/05/1941, 84 y.o., female Today's Date: 12/19/2024  PCP: April Millman, MD REFERRING PROVIDER: Mila Adah JONELLE, DO  END OF SESSION:  End of Session - 12/19/24 1101     Visit Number 8    Number of Visits 11    Date for Recertification  01/09/25    SLP Start Time 1020    SLP Stop Time  1100    SLP Time Calculation (min) 40 min    Activity Tolerance Patient tolerated treatment well                 Past Medical History:  Diagnosis Date   Allergic rhinitis    Arnold-Chiari malformation (HCC)    Asthma    CAD (coronary artery disease)    COPD (chronic obstructive pulmonary disease) (HCC)    Coughing    coughing since last 01-22-2019 , started on cefdinir bid x10 days, has 3 pills left today , reports she feels mcuh better , still coughing copiuus amonts of thick white sputum , denies fever nor chills, nor body aches .    DJD (degenerative joint disease)    GERD (gastroesophageal reflux disease)    Hiatal hernia    History of absence seizures    last confirmed seizure age 37     Hx of colonic polyps    Hypercholesteremia    Hypertension    Obesity    Positive PPD    Reflex sympathetic dystrophy    Sleep apnea    Varicose veins    Venous insufficiency    Past Surgical History:  Procedure Laterality Date   ABDOMINAL HYSTERECTOMY     CHOLECYSTECTOMY N/A 02/19/2023   Procedure: LAPAROSCOPIC CHOLECYSTECTOMY WITH INTRAOPERATIVE CHOLANGIOGRAM AND ICG DYE;  Surgeon: April Locus, MD;  Location: [facility] OR;  Service: General;  Laterality: N/A;   cspine surgery  12/02/1991   for arnold-chiari malformation   IR BONE MARROW BIOPSY & ASPIRATION  01/28/2023   JOINT REPLACEMENT  06/05/2011   Left total knee replacement   right knee arthroscopy  12/02/2007   Dr. Duwayne   right total knee replacement  05/31/2008   Dr. Duwayne   TONSILLECTOMY     TOTAL KNEE REVISION Left  02/03/2019   Procedure: LEFT TOTAL KNEE REVISION;  Surgeon: April Rogue, MD;  Location: [facility] ORS;  Service: Orthopedics;  Laterality: Left;   Patient Active Problem List   Diagnosis Date Noted   Seasonal and perennial allergic rhinitis 07/18/2024   History of smoking 25-50 pack years 07/18/2024   Painful swallowing 07/18/2024   Abnormal CT scan 12/19/2022   Pain of upper abdomen 12/19/2022   Dilation of biliary tract 12/16/2022   Cellulitis of right leg 06/18/2022   Hyperglycemia 06/18/2022   Laceration of right index finger 05/08/2022   Acquired hallux valgus of left foot 11/22/2021   Primary localized osteoarthrosis of ankle and foot 11/22/2021   AKI (acute kidney injury) 03/13/2021   Memory loss 03/13/2021   Anxiety 03/13/2021   Hypokalemia 03/05/2021   Generalized weakness 03/04/2021   Dizziness 03/04/2021   Vitamin D  deficiency 01/16/2021   Cough 12/13/2020   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   Allergic rhinitis due to animal (cat) (dog) hair and dander 12/04/2020   Allergic rhinitis due to pollen 12/04/2020   Moderate persistent asthma, uncomplicated 12/04/2020   Radiculopathy, lumbar region 11/19/2020   Spondylolisthesis, lumbar region 11/19/2020   Leg cramping 11/06/2020   Degeneration of  lumbar intervertebral disc 09/27/2020   Degenerative spondylolisthesis 08/15/2020   Pes anserinus bursitis of right knee 05/07/2020   Ulcer of toe, left, limited to breakdown of skin (HCC) 08/31/2019   Diastolic CHF, chronic (HCC) 08/31/2019   PVD (peripheral vascular disease) 08/31/2019   Bilateral carpal tunnel syndrome 08/19/2019   Posterior neck pain 08/19/2019   Vertigo 04/08/2019   Anemia 04/08/2019   Stiffness of left knee 02/10/2019   Failed total knee, left 02/03/2019   Failed total knee, left, initial encounter 02/03/2019   Carpal tunnel syndrome of left wrist 01/21/2019   Pain of left hand 01/21/2019   Preop exam for internal medicine 11/02/2018   Left hand paresthesia  11/02/2018   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 11/01/2018   Hyperopia with presbyopia, bilateral 06/29/2018   Abdominal pain, lower 03/02/2018   Diarrhea 03/02/2018   Encounter for well adult exam with abnormal findings 11/14/2017   Arnold-Chiari malformation (HCC)    Asthma, well controlled    CAD (coronary artery disease)    COPD (chronic obstructive pulmonary disease) (HCC)    DJD (degenerative joint disease)    History of absence seizures    Hx of colonic polyps    Hypercholesteremia    Positive PPD    Venous insufficiency    Pain in left lower leg 07/20/2017   Cellulitis of left lower extremity 06/18/2017   Bilateral lower extremity edema 04/30/2017   Shingles 02/26/2017   COPD exacerbation (HCC) 01/07/2017   S/P TKR (total knee replacement), bilateral 01/07/2017   Right arm pain 05/16/2015   Generalized anxiety disorder 08/31/2014   Varicose veins of bilateral lower extremities with other complications 01/06/2012   Edema 08/07/2011   MENORRHAGIA, POSTMENOPAUSAL 08/13/2010   CIGARETTE SMOKER 06/11/2010   CARDIAC MURMUR 11/02/2009   Bradycardia 11/01/2009   CHEST PAIN 11/01/2009   Allergic rhinitis 04/19/2009   Chronic obstructive airway disease with asthma (HCC) 04/18/2009   LEG CRAMPS, NOCTURNAL 11/02/2008   Essential hypertension 01/17/2008   COLONIC POLYPS 01/14/2008   Body mass index (BMI) 36.0-36.9, adult 01/14/2008   Reflex sympathetic dystrophy 01/14/2008   Coronary atherosclerosis 01/14/2008   LPRD (laryngopharyngeal reflux disease) 01/14/2008   Osteoarthritis 01/14/2008   BACK PAIN, LUMBAR 01/14/2008   SEIZURES, HX OF 01/14/2008    Onset date: Voice: ~3 months ago,                       Swallowing: Beginning of the year.  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 Pt accompanied by: self  PERTINENT HISTORY: (From 08/31/24 by Dr. Okey) [name] is an 84 year old female with a history of hiatal hernia on EGD 20 yrs ago, who presents with difficulty swallowing.   She has been experiencing difficulty swallowing for the past three months, describing it as a sensation of food not wanting to go down, particularly when taking medication. This occurs most of the time but not always. No recent illness preceded the onset of these symptoms.   A swallow study conducted in August was normal, but it only assessed the upper portion of her  swallow. An MRI of the brain was also normal, though there are some changes noted in the cervical spine.   She has a history of reflux and heartburn for which she is taking medication, and she reports that it helps. She has previously seen a GI doctor and underwent an upper endoscopy in 2005, which revealed a hiatal hernia.   She experiences hoarseness from time to time as well.  PAIN:  Are you having pain? Yes: Pain description: Back pain and arthritis pain.  FALLS: Has patient fallen in last 6 months? No, Number of falls: 0  LIVING ENVIRONMENT: Lives with: grandson Lives in: House/apartment  PLOF:Level of assistance: Independent with ADLs Employment: Retired  PATIENT GOALS: To improve voice and reduce perceived swallowing difficulties.  OBJECTIVE:  Note: Objective measures were completed at Evaluation unless otherwise noted.  DIAGNOSTIC FINDINGS: Flexible Fiberoptic Laryngoscopy Procedure Note (From 10/21/24) Pre-Op Diagnosis: hoarseness                                Post Op Diagnosis: same Procedure: Flexible Fiberoptic Laryngoscopy CPT 31575 - Mod 25 Surgeon: Adah Malkin, D.O. 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 It was passed along the floor of the nasal cavity to the nasopharynx. Torus tubarius was patent and the Fossa of Rosenmller was identified. The scope was then flexed caudally and advanced slowly through the nasopharynx, passed through the oropharynx, and down into the hypopharynx. The patient's oro- and nasopharynx were unremarkable with no signs of any gross lesions, edema, masses, or bleeding.  The base of tongue was visualized and no mass, ulceration or lesion was appreciated. The epiglottis did not demonstrate any mass, ulceration or lesion. The vallecula was also assessed with no mass, ulceration or lesion. The patient had good glottic closure upon phonation and no signs of aspiration or pooling of secretions. The patient was asked to inspire and expire with the true vocal folds vibrating normally and without evidence of vocal fold dysfunction. The right true vocal fold was noted to have a polypoid growth at the anterior 1/3 approaching the commissure. The true and false vocal cords, interarytenoid, AE folds, and arytenoids did not demonstrate any significant edema or erythema. The patient was then asked to valsalva, and the pyriform sinuses were assessed which were unremarkable. The airway was patent and there was no evidence of compromise. The scope was then slowly withdrawn from the patient. The patient tolerated the procedure well and there were no complications.  Disposition: Stable  COGNITION: Overall cognitive status: Within functional limits for tasks assessed   SOCIAL HISTORY: Occupation: Retired Water  intake: Five 12 oz/day. Caffeine/alcohol  intake: minimal and pt is currently drinking 1 soda/not consistently.  Daily voice use: moderate  PERCEPTUAL VOICE  ASSESSMENT: Voice quality: hoarse, breathy, rough, strained, and low vocal intensity Vocal abuse: habitual abnormal pitch Resonance: normal Respiratory function: thoracic breathing GRBAS: G-2, R-2, B-2, A-2, S-1  OBJECTIVE VOICE ASSESSMENT: Maximum phonation time for sustained ah: 2.8 seconds Conversational pitch average: 105 Hz Conversational pitch range:  90-130 Hz Conversational loudness average: 71 dB Conversational loudness range: 61-81 dB  S/z ratio: 2.91 (Suggestive of dysfunction >1.0)  PATIENT REPORTED OUTCOME MEASURES (PROM): V-RQOL: 33/50                                                                                                                            TREATMENT DATE:  Modified Resonant Voice therapy (RVT), Conversation Training Therapy (CTT)  12/19/24: Pt continues to practice her EMST/voice HEP daily. SLP reviewed how to perform EMST at 35, 55, and 75 cm H2O (10x each/ except 75). Pt performed EMST at 55 cm H2O successfully in 90% of opportunities given frequent mod A. Pt's technique with the device appeared to be more of an issue than respiratory strength requirements. When SLP increased the challenge to 75 cm H20, pt performed EMST 1x with significant difficulty noted by pt. SLP updated HEP recommendations to 55 cm H20, which is an improvement from previous session. SLP reviewed RVT with pt to optimize forward-focused resonance for balanced phonation. Pt said /m, n, j, z, w/ phrases with forward-focused resonance in 85% of opportunities given frequent min A for increasing loudness. When pt increases loudness, balanced phonation is achieved more frequently, potentially due to increased airflow. Conversation Training Therapy (CTT) was utilized to integrate pt's forward-resonance and diaphragmatic breath support for balanced phonation in conversation. During unstructured conversation, pt achieved balanced phonation in 70% of opportunities given frequent min A. SLP provided  cues for kinesthetic awareness to optimize pt's self-awareness of balanced phonation vs. unbalanced phonation. Pt displayed a moment of self-awareness and self-correction for forward-focused resonance. Pt continues to make progress with voice goals. Plan is to continue CTT for generalizing voice strategies into conversation.    12/12/24: Pt has been practicing her EMST device at home regularly.  SLP reviewed how to perform EMST at 35, 40, and 55 cm H2O (10x each). Pt performed EMST at 40 cm H2O successfully in 90% of opportunities given frequent min A. When SLP increased the challenge to 55 cm H20, pt performed EMST 10x with mild effort noted by pt. Pt is improving with EMST device. SLP recommended pt to continue home practice at 40 instead of 35 cm H2O. Modified Resonant Voice Therapy (RVT) exercises were completed to address forward resonant voice, reduce laryngeal tension, and improve vocal quality. Pt completed RVT exercises at word and phrase level with /m, v, w, z, j,/ initial sounds. Pt required cues for increasing airflow and forward-focused resonance. Pt completed 25+ repetitions of RVT exercises, achieving forward-focused resonance in 75% of opportunities given frequent min A. SLP updated pt's HEP to include additional RVT words and phrases for practice. Plan is to continue phrase/sentence level RVT and EMST for maximizing pt's voice quality and QOL.    12/05/24: Pt feels that her voice is a little better from previous session. Pt comes in with a slightly more forward-resonant voice compared to previous sessions. Pt's voice switched more to back-resonance as session went on. Pt brought in her Expiratory Muscle Strength Training device. SLP  reviewed how to perform EMST at 35 cm H2O. Pt performed EMST at 35 cm H2O successfully in 90% of opportunities given frequent min A. SLP reviewed resonant voice therapy (RVT) for optimizing pt forward resonance for balanced phonation. Pt said RVT /m/ words,  phrases, and sentences with clear voicing on 70% of opportunities given frequent mod to min A. SLP briefly reviewed flow phonation strategies for maximizing pt airflow for phonation. SLP required pt to sustain /s/ and /z/ for demonstrating airflow requirements for clear voicing. Pt benefits from more explicit instruction is respiratory retraining/flow phonation for improving airflow for phonation. Plan is to continue respiratory retraining/EMST/Flow phonation/RVT for decreasing laryngeal hyperfunction for improved pt QOL.   11/28/24: Pt has been practicing her HEP every day since previous session. She notes that she sang in church on Sunday and felt that her voice felt fine. Pt states that her voice sounds a little better this week. SLP reviewed diaphragmatic breathing. Pt demonstrated diaphragmatic breathing in 80% of opportunities during structured voice exercises given occasional min A. SLP provided direct instruction and modeling for completion of resonant voice therapy exercises to encourage forward resonance and decrease tension when voicing. Pt demonstrated forward-resonance with 75% consistency during structured exercises at the word, phrase, and sentence levels with /m/ and /n/ sounds, provided with frequent min-A from SLP. Pt benefitting from cues to increase forward placement of the voice. SLP introduced Semi-Occluded Vocal Tract Exercises (SOVTEs) (Straw Phonation) as a strategy for increasing consistent airflow for voice production. After SLP guidance, pt successfully completed SOVTEs using a straw with and without water  given frequent min A for balanced phonation. Pt cleared her throat 4x during session. SLP educated pt about alternative strategies to decrease throat clearing for optimal vocal hygiene (water  sips, hard swallow, shhhhh follwed by a deep breath). Pt is agreeable to trying alternative strategies for decreasing throat-clearing. SLP provided handout related to throat-clearing strategies  for improving vocal hygiene. Plan is to continue resonant voice therapy and respiratory muscle strength training for decreasing laryngeal hyperfunction.  11/21/24: Pt has been practicing her HEP every day since previous session. Pt states that her voice sounds a little better this week. SLP reviewed diaphragmatic breathing. Pt demonstrated diaphragmatic breathing in 75% of opportunities during structured voice exercises given occasional min A. SLP provided direct instruction and modeling for completion of resonant voice therapy exercises to encourage forward resonance and decrease tension when voicing. Pt demonstrated forward-resonance with 70% consistency during structured exercises at the word, phrase, and sentence level with /m/ and /n/ sounds, provided with frequent min-A from SLP. Pt benefitting from cues to increase forward placement of the voice. Updated HEP to include /n/ words, phrases, and sentences for continued resonant voice work. Plan is to continue resonant voice therapy and respiratory muscle strength training for decreasing laryngeal hyperfunction.  11/16/24: Pt has been practicing her HEP every day since previous session. Pt is trying to drink 5-6 water  bottles, which is an improvement from prior session. Pt was 65% consistent with performing diaphragmatic breathing given frequent min A. SLP reviewed Resonant Voice Therapy (RVT) for maximizing forward resonance during voice production. During /b/ words, pt was 70% consistent with achieving forward resonance during voice production given frequent min A. SLP challenged pt to utilize forward resonance during /m/ words. Pt achieved clear voice in 65% of opportunities during /m/ word productions given frequent mod A. Suspect that pt's breath management is a major factor for reduced performance. SLP introduced Respiratory Muscle Strength Training (RMST) as a  tool for improving expiratory muscle strength for voice production. SLP began guiding pt  through using RMST device; pt displayed emerging understanding of device usage. Pt will need reinforcement in upcoming session for improving understanding and performance with device. Pt is making progress with voice goals. Plan is to continue RVT and RMST for optimizing voice production for QOL.  11/09/24: SLP had pt complete EAT-10 to assess self-perception of swallow ability. Pt notes that swallowing pills and solids is more difficult than liquids. SLP introduced strategies for vocal hygiene (increased water  intake, GERD lifestyle changes). Pt is agreeable to making adjustments to water  intake and utilizing lifestyle changes for GERD to optimize vocal health and wellness. SLP engaged pt in respiratory retraining, focusing on establishing diaphragmatic breath for balance phonation. After SLP guided demonstration, pt was 70% consistent with performing diaphragmatic breathing given frequent min A. SLP reintroduced Resonant Voice Therapy (RVT) for maximizing forward resonance during voice production. During /b/ words, pt was 75% consistent with achieving forward resonance during voice production given frequent min A. Pt is making progress with voice goals. Plan is to continue RVT and introduce vocal function exercises for reducing laryngeal hyperfunction.   10/31/24: Evaluation completed. SLP initiated tx on this date. SLP performed stimulability with pt to measure prognosis with voice therapy exercises. SLP introduced Resonant Voice Therapy (RVT) through /b/ syllables and words. Pt demonstrated ability to achieve clear voicing using RVT. SLP educated pt about basic physiology of the voice (respiration, phonation, articulation/resonance) for optimizing pt understanding of voice exercises. Pt demonstrated emerging understanding of physiology of voice; will need reinforcement in future sessions. Plan is to continue RVT and introduced vocal function exercises (VFEs) for maximizing clear voicing for QOL.    PATIENT  EDUCATION: Education details: Physiology of voice Person educated: Patient Education method: Explanation Education comprehension: verbalized understanding  HOME EXERCISE PROGRAM: TBD in upcoming sessions.  GOALS: Goals reviewed with patient? Yes  SHORT TERM GOALS: Target date: 12/03/24  Pt will utilize diaphragmatic breathing during structured exercises and conversation in 80% of opportunities given occasional min A. Baseline: Pt uses thoracic breathing pattern. Goal status: ONGOING  2.  Pt will achieve clear voicing during structured exercises and conversation in 80% of opportunities across 2 sessions given occasional min A. Baseline:  Goal status: ONGOING  3.  Pt will describe the basic physiology of the voice (Breathing, Phonation, Artic/Resonance) as related to voice therapy exercises with 90% consistency given rare min A.  Baseline:  Goal status: ONGOING  4.  Pt will describe at least 4 swallow precautions/exercises to given min A.  Baseline:  Goal status: ONGOING  5.  Pt will perform structured, sustained phonation exercises with clear voicing in 80% of opportunities when given occasional min A.  Baseline:  Goal status: ONGOING  6.  Pt will complete PROM for swallowing/reflux. Baseline:  Goal status: MET  LONG TERM GOALS: Target date: 01/09/25  Pt will achieve clear voicing during unstructured conversation in 85% of opportunities given rare min A.  Baseline: Pt voice is dysphonic.  Goal status: ONGOING  2.  Pt will report decreased score on PROM (V-RQOL), displaying self-perceived improvement of voice. Baseline: 33/50 Goal status: ONGOING  3.  Pt will describe and utilize swallow precautions to minimize self-reported swallowing difficulties.  Baseline:  Goal status: ONGOING   ASSESSMENT:  CLINICAL IMPRESSION: Patient is a 84 y.o. female who was seen today for a behavioral voice evaluation. Pt presents with a moderate dysphonia characterized by rough,  breathy, and hoarse vocal  quality and 33/50 on V-RQOL scale. According to PROM, pt frequently experiences depressive thoughts and avoids socializing in public due to dysphonia. Pt noted that her dysphonia has affected her ability to sing. Pt was a regular singer up until 3 months ago when her dysphonia started. In addition, pt reported trouble swallowing, specifically with pills. Pt had MBSS recently (07/08/24), with no significant findings. Pt does note hx of reflux and that she currently takes medication for reflux.   OBJECTIVE IMPAIRMENTS: include voice disorder. These impairments are limiting patient from effectively communicating at home and in community. Factors affecting potential to achieve goals and functional outcome are N/A. Patient will benefit from skilled SLP services to address above impairments and improve overall function.  REHAB POTENTIAL: Good  PLAN:  SLP FREQUENCY: 1x/week  SLP DURATION: 10 weeks  PLANNED INTERVENTIONS: Aspiration precaution training, SLP instruction and feedback, Compensatory strategies, and Patient/family education    [employer], M.A., CF-SLP 12/19/2024, 11:02 AM      "

## 2024-12-21 ENCOUNTER — Encounter: Payer: Self-pay | Admitting: Physical Therapy

## 2024-12-21 ENCOUNTER — Ambulatory Visit: Admitting: Physical Therapy

## 2024-12-21 VITALS — BP 116/72 | HR 52

## 2024-12-21 DIAGNOSIS — M6281 Muscle weakness (generalized): Secondary | ICD-10-CM

## 2024-12-21 DIAGNOSIS — R2689 Other abnormalities of gait and mobility: Secondary | ICD-10-CM

## 2024-12-21 NOTE — Therapy (Signed)
 " OUTPATIENT PHYSICAL THERAPY NEURO TREATMENT   Patient Name: [name] MRN: [id number] DOB:1941-06-14, 84 y.o., female Today's Date: 12/21/2024  PCP: April Millman, MD REFERRING PROVIDER: Gregg Lek, MD  END OF SESSION:  PT End of Session - 12/21/24 1106     Visit Number 14    Number of Visits 17   16 plus Eval   Date for Recertification  01/09/25   for scheduling delays   Authorization Type [employer]    PT Start Time 1105   pt reports there was a line to check in   PT Stop Time 1147    PT Time Calculation (min) 42 min    Equipment Utilized During Treatment Gait belt    Activity Tolerance Patient tolerated treatment well    Behavior During Therapy WFL for tasks assessed/performed          Past Medical History:  Diagnosis Date   Allergic rhinitis    Arnold-Chiari malformation (HCC)    Asthma    CAD (coronary artery disease)    COPD (chronic obstructive pulmonary disease) (HCC)    Coughing    coughing since last 01-22-2019 , started on cefdinir bid x10 days, has 3 pills left today , reports she feels mcuh better , still coughing copiuus amonts of thick white sputum , denies fever nor chills, nor body aches .    DJD (degenerative joint disease)    GERD (gastroesophageal reflux disease)    Hiatal hernia    History of absence seizures    last confirmed seizure age 29     Hx of colonic polyps    Hypercholesteremia    Hypertension    Obesity    Positive PPD    Reflex sympathetic dystrophy    Sleep apnea    Varicose veins    Venous insufficiency    Past Surgical History:  Procedure Laterality Date   ABDOMINAL HYSTERECTOMY     CHOLECYSTECTOMY N/A 02/19/2023   Procedure: LAPAROSCOPIC CHOLECYSTECTOMY WITH INTRAOPERATIVE CHOLANGIOGRAM AND ICG DYE;  Surgeon: April Locus, MD;  Location: [facility] OR;  Service: General;  Laterality: N/A;   cspine surgery  12/02/1991   for arnold-chiari malformation   IR BONE MARROW BIOPSY & ASPIRATION  01/28/2023   JOINT REPLACEMENT   06/05/2011   Left total knee replacement   right knee arthroscopy  12/02/2007   Dr. Duwayne   right total knee replacement  05/31/2008   Dr. Duwayne   TONSILLECTOMY     TOTAL KNEE REVISION Left 02/03/2019   Procedure: LEFT TOTAL KNEE REVISION;  Surgeon: April Rogue, MD;  Location: [facility] ORS;  Service: Orthopedics;  Laterality: Left;   Patient Active Problem List   Diagnosis Date Noted   Seasonal and perennial allergic rhinitis 07/18/2024   History of smoking 25-50 pack years 07/18/2024   Painful swallowing 07/18/2024   Abnormal CT scan 12/19/2022   Pain of upper abdomen 12/19/2022   Dilation of biliary tract 12/16/2022   Cellulitis of right leg 06/18/2022   Hyperglycemia 06/18/2022   Laceration of right index finger 05/08/2022   Acquired hallux valgus of left foot 11/22/2021   Primary localized osteoarthrosis of ankle and foot 11/22/2021   AKI (acute kidney injury) 03/13/2021   Memory loss 03/13/2021   Anxiety 03/13/2021   Hypokalemia 03/05/2021   Generalized weakness 03/04/2021   Dizziness 03/04/2021   Vitamin D  deficiency 01/16/2021   Cough 12/13/2020   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   Allergic rhinitis due to animal (cat) (dog) hair and dander 12/04/2020  Allergic rhinitis due to pollen 12/04/2020   Moderate persistent asthma, uncomplicated 12/04/2020   Radiculopathy, lumbar region 11/19/2020   Spondylolisthesis, lumbar region 11/19/2020   Leg cramping 11/06/2020   Degeneration of lumbar intervertebral disc 09/27/2020   Degenerative spondylolisthesis 08/15/2020   Pes anserinus bursitis of right knee 05/07/2020   Ulcer of toe, left, limited to breakdown of skin (HCC) 08/31/2019   Diastolic CHF, chronic (HCC) 08/31/2019   PVD (peripheral vascular disease) 08/31/2019   Bilateral carpal tunnel syndrome 08/19/2019   Posterior neck pain 08/19/2019   Vertigo 04/08/2019   Anemia 04/08/2019   Stiffness of left knee 02/10/2019   Failed total knee, left 02/03/2019   Failed total knee,  left, initial encounter 02/03/2019   Carpal tunnel syndrome of left wrist 01/21/2019   Pain of left hand 01/21/2019   Preop exam for internal medicine 11/02/2018   Left hand paresthesia 11/02/2018   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 11/01/2018   Hyperopia with presbyopia, bilateral 06/29/2018   Abdominal pain, lower 03/02/2018   Diarrhea 03/02/2018   Encounter for well adult exam with abnormal findings 11/14/2017   Arnold-Chiari malformation (HCC)    Asthma, well controlled    CAD (coronary artery disease)    COPD (chronic obstructive pulmonary disease) (HCC)    DJD (degenerative joint disease)    History of absence seizures    Hx of colonic polyps    Hypercholesteremia    Positive PPD    Venous insufficiency    Pain in left lower leg 07/20/2017   Cellulitis of left lower extremity 06/18/2017   Bilateral lower extremity edema 04/30/2017   Shingles 02/26/2017   COPD exacerbation (HCC) 01/07/2017   S/P TKR (total knee replacement), bilateral 01/07/2017   Right arm pain 05/16/2015   Generalized anxiety disorder 08/31/2014   Varicose veins of bilateral lower extremities with other complications 01/06/2012   Edema 08/07/2011   MENORRHAGIA, POSTMENOPAUSAL 08/13/2010   CIGARETTE SMOKER 06/11/2010   CARDIAC MURMUR 11/02/2009   Bradycardia 11/01/2009   CHEST PAIN 11/01/2009   Allergic rhinitis 04/19/2009   Chronic obstructive airway disease with asthma (HCC) 04/18/2009   LEG CRAMPS, NOCTURNAL 11/02/2008   Essential hypertension 01/17/2008   COLONIC POLYPS 01/14/2008   Body mass index (BMI) 36.0-36.9, adult 01/14/2008   Reflex sympathetic dystrophy 01/14/2008   Coronary atherosclerosis 01/14/2008   LPRD (laryngopharyngeal reflux disease) 01/14/2008   Osteoarthritis 01/14/2008   BACK PAIN, LUMBAR 01/14/2008   SEIZURES, HX OF 01/14/2008    ONSET DATE: 09/30/2024 (date of referral)  REFERRING DIAG: R53.1 (ICD-10-CM) - Weakness M54.2 (ICD-10-CM) -  Cervicalgia  THERAPY DIAG:  Other abnormalities of gait and mobility  Muscle weakness (generalized)  Rationale for Evaluation and Treatment: Rehabilitation  SUBJECTIVE:  Likes to be called [name]  SUBJECTIVE STATEMENT: No recent falls.  Goes to MD next Wednesday.  No change in R pinky pain after last therapy session (consistent soreness).  No changes reported.  Doing HEP; no questions reported. Pt accompanied by: self  PERTINENT HISTORY: Pt with tremors and weakness (noted back in June; went to ED; MRI brain did not show acute abnormalities).  Difficulty eating and holding objects; difficulty writing; tremors worsen during the day.  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  Per PT referral: Cervicalgia. Left side weakness. 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  PAIN: 8/10 back pain (pt reporting h/o this pain); 0/10 stomach pain (h/o having on/off); R pinky pain (lateral when bending finger) 6/10 Are you having pain? Yes: NPRS scale: 0/10 Pain location: low back Pain description: sharp shooting; aching Aggravating factors: standing a lot Relieving factors: pain patches; medication cream  PRECAUTIONS: Fall; Swallowing difficulties (with pills and certain foods; not liquids/water )  RED FLAGS: None   WEIGHT BEARING RESTRICTIONS: No  FALLS: Has patient fallen in last 6 months? No  LIVING ENVIRONMENT: Lives with: lives with their family (grandson) Lives in: House/apartment Stairs: Yes: External: 5 steps; on right going up, on left going up, and  can reach both Has following equipment at home: Single point cane and Walker - 2 wheeled  PLOF: Ambulates with RW (last 6 months or more).  Participated in OP PT first part of this year.  Was doing water  aerobics at [facility] (hasn't done in last 5 months d/t stomach acting up).  PATIENT GOALS: Try to get strength back and do some of the things she was able to do before.  OBJECTIVE:  Note: Objective measures were completed at Evaluation unless otherwise noted.  DIAGNOSTIC FINDINGS:  MRI C-spine 09/26/24: - Multilevel spondylosis, disc bulging, reversal of normal cervical curvature and grade 2 anterior spondylolisthesis of C2 and C3 measuring 7 mm. - At C6-7 disc bulging and facet hypertrophy with moderate to severe spinal stenosis and severe bilateral foraminal stenosis; no cord signal abnormalities. - At C2-3 pseudo disc bulging and facet hypertrophy with moderate spinal stenosis and severe bilateral foraminal stenosis; no cord signal abnormalities. - At C3-4 disc bulging with mild spinal stenosis and severe bilateral foraminal stenosis; no cord signal abnormalities.  COGNITION: Overall cognitive status: Within functional limits for tasks assessed   SENSATION: Decreased light touch sensation L lateral thigh, medial lower leg, and entire foot  COORDINATION: Decreased alternating toe taps L LE compared to R LE  EDEMA: (chronic L>R LE per pt report) Circumferential: malleolar line 27.5 cm R ankle; 28 cm L ankle  POSTURE: rounded shoulders and forward head  LOWER EXTREMITY MMT:    MMT Right Eval Left Eval  Hip flexion 4+/5 4+/5  Hip extension    Hip abduction    Hip adduction    Hip internal rotation    Hip external rotation    Knee flexion    Knee extension 4+/5 4+/5  Ankle dorsiflexion 4+/5 2/5  Ankle plantarflexion At least 3/5 AROM At least 2/5 AROM  Ankle inversion    Ankle eversion    (Blank rows = not tested)  TRANSFERS: Sit to stand: Modified independence  Assistive  device utilized: [employer]ant - 2 wheeled     Stand to sit:  Modified independence  Assistive device utilized: Environmental Consultant - 2 wheeled      GAIT: Findings: Gait Characteristics: narrow BOS; R LE ER>L LE; foot flat R LE; decreased L LE DF with forefoot initial WB'ing during initial contact L LE; increased L trunk lean; short steps, Distance walked: clinic distances, Assistive device utilized:Walker - 2 wheeled, Level of assistance: Modified independence, and Comments: decreased gait speed  FUNCTIONAL TESTS:  5 times sit to stand: 49.78 seconds with B UE support (up to RW); 35.47 seconds 11/28/24 10 meter walk test: 0.31 m/sec (31.63 seconds with RW use); 0.51 m/sec (19.35 seconds) with RW 12/07/24 BERG Balance Test: 22/56 Item T/est date: 11/07/24 Date:  Date:   Sitting to standing 3. able to stand independently using hands Insert SmartPhrase OPRCBERGREEVAL Insert SmartPhrase OPRCBERGREEVAL  2. Standing unsupported 3. able to stand 2 minutes with supervision    3. Sitting with back unsupported, feet supported 4. able to sit safely and securely for 2 minutes    4. Standing to sitting 3. controls descent by using hands    5. Pivot transfer  3. able to transfer safely with definite need of hands    6. Standing unsupported with eyes closed 3. able to stand 10 seconds with supervision    7. Standing unsupported with feet together 1. needs help to attain position but able to stand 15 seconds feet together    8. Reaching forward with outstretched arms while standing 1. reaches forward but needs supervision    9. Pick up object from the floor from standing 0. unable to try/needs assistance to keep from losing balance or falling; needs assist for balance    10. Turning to look behind over left and right shoulders while standing 1. needs supervision when turning    11. Turn 360 degrees 0. needs assistance while turning    12. Place alternate foot on step or stool while standing unsupported 0. needs assistance to keep  from falling/unable to try; assist to keep balance with 2 attempts    13. Standing unsupported one foot in front 0. loses balance while stepping or standing    14. Standing on one leg 0. unable to try of needs assist to prevent fall ; need assist to maintain balance     Total Score 22/56 Total Score:    Total Score:      PATIENT SURVEYS:  TBA                                                                                                                             TREATMENT DATE: 12/21/24  Self Care: BP and HR taken in sitting at rest beginning of session (see below for details). Vitals:   12/21/24 1109  BP: 116/72  Pulse: (!) 52  HR up to 68 bpm with activity  Therapeutic Exercise: SciFit multi-peaks up to level 4 for 8 minutes using BUE/BLEs for neural priming for reciprocal movement, dynamic cardiovascular warmup and increased  amplitude of stepping. RPE of 7/10 following activity.  Average stride length 6.7 inches.   Gait training:  x115 feet with SPC R UE (CGA) Forward walking in // bars x4 trials (8 feet each trial) no UE support Notes: CGA for safety; decreased step length but improved with repetition Retro walking in // bars x4 trials (8 feet each trial) no UE support Notes: CGA for safety; decreased step length noted Sidestepping in // bars (8 feet each direction): x3 trials to R and x3 trials to L (no UE support) Notes: CGA for safety; vc's for positioning intermittently   PATIENT EDUCATION: Education details: Follow-up with PCP regarding R pinky pain (pt reports having visit next week and will address at that time).  Continue HEP.  Number of visits left/scheduled and then plan for graduation from therapy. Person educated: Patient Education method: Explanation, Demonstration, Verbal cues, and Handouts Education comprehension: verbalized understanding and needs further education  HOME EXERCISE PROGRAM: Access Code: [email or web address] URL:  [email or web address] Date: 12/14/2024 Prepared by: Damien Caulk  Exercises - Seated March  - 1 x daily - 5 x weekly - 1-2 sets - 10 reps - Seated Long Arc Quad  - 1 x daily - 5 x weekly - 1-2 sets - 10 reps - Seated Hip Abduction with Resistance  - 1 x daily - 5 x weekly - 1-2 sets - 10 reps - Seated Heel Raise  - 1 x daily - 5 x weekly - 1-2 sets - 10 reps - Seated Toe Raise  - 1 x daily - 5 x weekly - 1-2 sets - 10 reps - Standing March with Counter Support  - 1 x daily - 3-5 x weekly - 2-3 sets - 10 reps - Standing Hip Abduction with Counter Support  - 1 x daily - 3-5 x weekly - 2-3 sets - 10 reps - Heel Raises with Counter Support  - 1 x daily - 3-5 x weekly - 2-3 sets - 10 reps - Standing Tandem Balance with Counter Support  - 1 x daily - 3-5 x weekly - 1 sets - 2-3 reps - 15-30 seconds hold  GOALS: Goals reviewed with patient? Yes  SHORT TERM GOALS: Target date: 11/28/2024  Pt will be independent with initial HEP in order to improve strength and balance in order to decrease fall risk and improve function at home for ADL's. Baseline: TBA Goal status: MET  2.  Assess BERG Balance Test and update LTG as needed. Baseline: 22/56 11/07/24 Goal status: MET  3.  Pt will decrease 5 Time Sit to Stand by at least 10 seconds in order to demonstrate clinically significant improvement in LE strength. Baseline: 49.78 seconds (Eval); 35.47 seconds 11/28/24 Goal status: MET   LONG TERM GOALS: Target date: 12/26/2024  Pt will be independent with HEP in order to improve strength and balance in order to decrease fall risk and improve function at home for ADL's. Baseline: TBA Goal status: INITIAL  2.  Patient will demonstrate an improved Berg Balance Score of 40 or greater as to demonstrate improved balance with ADLs and reduced fall risk. Baseline: 22/56 11/07/24 Goal status: REVISED  3.  Pt will decrease 5 Time Sit to Stand by at least 20 seconds in order to demonstrate  clinically significant improvement in LE strength. Baseline: 49.78 seconds (Eval); 35.47 seconds 11/28/24 Goal status: INITIAL  4.  Pt will increase by at least 0.13 m/s in order to demonstrate clinically significant improvement in community ambulation.  Baseline: 0.31  m/sec on Eval; 0.51 m/sec 12/07/24 Goal status: MET  5.  Pt will demonstrate improved L LE foot clearance for safety with gait. Baseline: Decreased L foot clearance with RW use Goal status: INITIAL  ASSESSMENT:  CLINICAL IMPRESSION: Patient was seen today for physical therapy treatment to address strength and gait.  Focused session on global strengthening, ambulation with SPC, and activities to improve ambulation (no UE support with forward/retro/and sidestepping activities).  Patient continues to be limited by strength and balance.  They demonstrate improvement in balance with ambulation activities (no UE support) with repetition of activities.  They would continue to benefit from skilled PT to address impairments as noted and progress towards long term goals.  OBJECTIVE IMPAIRMENTS: Abnormal gait, cardiopulmonary status limiting activity, decreased activity tolerance, decreased balance, decreased coordination, decreased endurance, decreased knowledge of condition, decreased knowledge of use of DME, decreased mobility, difficulty walking, decreased ROM, decreased strength, increased edema, impaired flexibility, impaired sensation, improper body mechanics, postural dysfunction, and pain.   ACTIVITY LIMITATIONS: carrying, lifting, bending, standing, squatting, stairs, transfers, bed mobility, continence, toileting, dressing, locomotion level, and caring for others  PARTICIPATION LIMITATIONS: meal prep, cleaning, laundry, shopping, community activity, and yard work  PERSONAL FACTORS: Age, Fitness, Past/current experiences, and 1-2 comorbidities: COPD, HFpEF, bradycardia, and memory loss are also affecting patient's functional  outcome.   REHAB POTENTIAL: Good  CLINICAL DECISION MAKING: Evolving/moderate complexity  EVALUATION COMPLEXITY: Moderate  PLAN:  PT FREQUENCY: 2x/week  PT DURATION: 8 weeks  PLANNED INTERVENTIONS: [id number]- PT Re-evaluation, 97750- Physical Performance Testing, 97110-Therapeutic exercises, 97530- Therapeutic activity, [id number]- Neuromuscular re-education, 97535- Self Care, [id number]- Manual therapy, [id number]- Gait training, [phone]- Orthotic Initial, [phone]- Orthotic/Prosthetic subsequent, [phone]- Aquatic Therapy, [phone]- Electrical stimulation (manual), [id number]- Ultrasound, [id number]- Traction (mechanical), [phone] (1-2 muscles), [id number] (3+ muscles)- Dry Needling, Patient/Family education, Balance training, Stair training, Taping, Joint mobilization, Spinal mobilization, Compression bandaging, Cognitive remediation, DME instructions, Cryotherapy, Moist heat, and Biofeedback  PLAN FOR NEXT SESSION: Check on updated HEP.  Gait training with SPC use (pt reporting goal to transition off walker).  Balance, strengthening, aerobic endurance; coordination.   Damien Caulk, PT 12/21/2024, 11:51 AM        "

## 2024-12-26 ENCOUNTER — Ambulatory Visit: Admitting: Physical Therapy

## 2024-12-26 ENCOUNTER — Ambulatory Visit

## 2024-12-28 ENCOUNTER — Ambulatory Visit: Admitting: Physical Therapy

## 2024-12-28 ENCOUNTER — Encounter: Payer: Self-pay | Admitting: Physical Therapy

## 2024-12-28 VITALS — BP 118/72 | HR 55

## 2024-12-28 DIAGNOSIS — R2689 Other abnormalities of gait and mobility: Secondary | ICD-10-CM

## 2024-12-28 DIAGNOSIS — M6281 Muscle weakness (generalized): Secondary | ICD-10-CM

## 2024-12-28 NOTE — Therapy (Signed)
 " OUTPATIENT PHYSICAL THERAPY NEURO TREATMENT--DISCHARGE NOTE  PHYSICAL THERAPY DISCHARGE SUMMARY  Visits from Start of Care: 15  Current functional level related to goals / functional outcomes: Modified independent ambulating with RW in community.  Modified independent using SPC within home.   Remaining deficits: Higher level balance.   Education / Equipment: HEP issued (red thera-band issued).   Patient agrees to discharge. Patient goals were met. Patient is being discharged due to meeting the stated rehab goals.  Damien Caulk, PT 12/28/24, 12:21 PM   Patient Name: [name] MRN: [id number] DOB:09-04-1941, 84 y.o., female Today's Date: 12/28/2024  PCP: [name], MD REFERRING PROVIDER: Gregg Lek, MD  END OF SESSION:  PT End of Session - 12/28/24 1054     Visit Number 15    Number of Visits 17   16 plus Eval   Date for Recertification  01/09/25   for scheduling delays   Authorization Type [employer]    PT Start Time 1052    PT Stop Time 1137    PT Time Calculation (min) 45 min    Equipment Utilized During Treatment Gait belt    Activity Tolerance Patient tolerated treatment well    Behavior During Therapy WFL for tasks assessed/performed          Past Medical History:  Diagnosis Date   Allergic rhinitis    Arnold-Chiari malformation (HCC)    Asthma    CAD (coronary artery disease)    COPD (chronic obstructive pulmonary disease) (HCC)    Coughing    coughing since last 01-22-2019 , started on cefdinir bid x10 days, has 3 pills left today , reports she feels mcuh better , still coughing copiuus amonts of thick white sputum , denies fever nor chills, nor body aches .    DJD (degenerative joint disease)    GERD (gastroesophageal reflux disease)    Hiatal hernia    History of absence seizures    last confirmed seizure age 67     Hx of colonic polyps    Hypercholesteremia    Hypertension    Obesity    Positive PPD    Reflex sympathetic  dystrophy    Sleep apnea    Varicose veins    Venous insufficiency    Past Surgical History:  Procedure Laterality Date   ABDOMINAL HYSTERECTOMY     CHOLECYSTECTOMY N/A 02/19/2023   Procedure: LAPAROSCOPIC CHOLECYSTECTOMY WITH INTRAOPERATIVE CHOLANGIOGRAM AND ICG DYE;  Surgeon: Tanda Locus, MD;  Location: [facility] OR;  Service: General;  Laterality: N/A;   cspine surgery  12/02/1991   for arnold-chiari malformation   IR BONE MARROW BIOPSY & ASPIRATION  01/28/2023   JOINT REPLACEMENT  06/05/2011   Left total knee replacement   right knee arthroscopy  12/02/2007   Dr. Duwayne   right total knee replacement  05/31/2008   Dr. Duwayne   TONSILLECTOMY     TOTAL KNEE REVISION Left 02/03/2019   Procedure: LEFT TOTAL KNEE REVISION;  Surgeon: Fidel Rogue, MD;  Location: [facility] ORS;  Service: Orthopedics;  Laterality: Left;   Patient Active Problem List   Diagnosis Date Noted   Seasonal and perennial allergic rhinitis 07/18/2024   History of smoking 25-50 pack years 07/18/2024   Painful swallowing 07/18/2024   Abnormal CT scan 12/19/2022   Pain of upper abdomen 12/19/2022   Dilation of biliary tract 12/16/2022   Cellulitis of right leg 06/18/2022   Hyperglycemia 06/18/2022   Laceration of right index finger 05/08/2022   Acquired hallux  valgus of left foot 11/22/2021   Primary localized osteoarthrosis of ankle and foot 11/22/2021   AKI (acute kidney injury) 03/13/2021   Memory loss 03/13/2021   Anxiety 03/13/2021   Hypokalemia 03/05/2021   Generalized weakness 03/04/2021   Dizziness 03/04/2021   Vitamin D  deficiency 01/16/2021   Cough 12/13/2020   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   Allergic rhinitis due to animal (cat) (dog) hair and dander 12/04/2020   Allergic rhinitis due to pollen 12/04/2020   Moderate persistent asthma, uncomplicated 12/04/2020   Radiculopathy, lumbar region 11/19/2020   Spondylolisthesis, lumbar region 11/19/2020   Leg cramping 11/06/2020   Degeneration of lumbar  intervertebral disc 09/27/2020   Degenerative spondylolisthesis 08/15/2020   Pes anserinus bursitis of right knee 05/07/2020   Ulcer of toe, left, limited to breakdown of skin (HCC) 08/31/2019   Diastolic CHF, chronic (HCC) 08/31/2019   PVD (peripheral vascular disease) 08/31/2019   Bilateral carpal tunnel syndrome 08/19/2019   Posterior neck pain 08/19/2019   Vertigo 04/08/2019   Anemia 04/08/2019   Stiffness of left knee 02/10/2019   Failed total knee, left 02/03/2019   Failed total knee, left, initial encounter 02/03/2019   Carpal tunnel syndrome of left wrist 01/21/2019   Pain of left hand 01/21/2019   Preop exam for internal medicine 11/02/2018   Left hand paresthesia 11/02/2018   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 11/01/2018   Hyperopia with presbyopia, bilateral 06/29/2018   Abdominal pain, lower 03/02/2018   Diarrhea 03/02/2018   Encounter for well adult exam with abnormal findings 11/14/2017   Arnold-Chiari malformation (HCC)    Asthma, well controlled    CAD (coronary artery disease)    COPD (chronic obstructive pulmonary disease) (HCC)    DJD (degenerative joint disease)    History of absence seizures    Hx of colonic polyps    Hypercholesteremia    Positive PPD    Venous insufficiency    Pain in left lower leg 07/20/2017   Cellulitis of left lower extremity 06/18/2017   Bilateral lower extremity edema 04/30/2017   Shingles 02/26/2017   COPD exacerbation (HCC) 01/07/2017   S/P TKR (total knee replacement), bilateral 01/07/2017   Right arm pain 05/16/2015   Generalized anxiety disorder 08/31/2014   Varicose veins of bilateral lower extremities with other complications 01/06/2012   Edema 08/07/2011   MENORRHAGIA, POSTMENOPAUSAL 08/13/2010   CIGARETTE SMOKER 06/11/2010   CARDIAC MURMUR 11/02/2009   Bradycardia 11/01/2009   CHEST PAIN 11/01/2009   Allergic rhinitis 04/19/2009   Chronic obstructive airway disease with asthma (HCC) 04/18/2009   LEG  CRAMPS, NOCTURNAL 11/02/2008   Essential hypertension 01/17/2008   COLONIC POLYPS 01/14/2008   Body mass index (BMI) 36.0-36.9, adult 01/14/2008   Reflex sympathetic dystrophy 01/14/2008   Coronary atherosclerosis 01/14/2008   LPRD (laryngopharyngeal reflux disease) 01/14/2008   Osteoarthritis 01/14/2008   BACK PAIN, LUMBAR 01/14/2008   SEIZURES, HX OF 01/14/2008    ONSET DATE: 09/30/2024 (date of referral)  REFERRING DIAG: R53.1 (ICD-10-CM) - Weakness M54.2 (ICD-10-CM) - Cervicalgia  THERAPY DIAG:  Other abnormalities of gait and mobility  Muscle weakness (generalized)  Rationale for Evaluation and Treatment: Rehabilitation  SUBJECTIVE:  Likes to be called [name]  SUBJECTIVE STATEMENT: Pt reports she hasn't been out of the house since last Thursday d/t weather.  No recent falls.  Pt reports R pinky is getting better (mildly sore).  Doing HEP (no questions). Pt accompanied by: self  PERTINENT HISTORY: Pt with tremors and weakness (noted back in June; went to ED; MRI brain did not show acute abnormalities).  Difficulty eating and holding objects; difficulty writing; tremors worsen during the day.  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  Per PT referral: Cervicalgia. Left side weakness. 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  PAIN: 0/10 back pain (pt reporting h/o this pain); 6/10 stomach pain (h/o having on/off); R pinky pain (lateral when bending finger) 6/10 Are you having pain? Yes: NPRS scale: 0/10 Pain location: low back Pain  description: sharp shooting; aching Aggravating factors: standing a lot Relieving factors: pain patches; medication cream  PRECAUTIONS: Fall; Swallowing difficulties (with pills and certain foods; not liquids/water )  RED FLAGS: None   WEIGHT BEARING RESTRICTIONS: No  FALLS: Has patient fallen in last 6 months? No  LIVING ENVIRONMENT: Lives with: lives with their family (grandson) Lives in: House/apartment Stairs: Yes: External: 5 steps; on right going up, on left going up, and can reach both Has following equipment at home: Single point cane and Walker - 2 wheeled  PLOF: Ambulates with RW (last 6 months or more).  Participated in OP PT first part of this year.  Was doing water  aerobics at [facility] (hasn't done in last 5 months d/t stomach acting up).  PATIENT GOALS: Try to get strength back and do some of the things she was able to do before.  OBJECTIVE:  Note: Objective measures were completed at Evaluation unless otherwise noted.  DIAGNOSTIC FINDINGS:  MRI C-spine 09/26/24: - Multilevel spondylosis, disc bulging, reversal of normal cervical curvature and grade 2 anterior spondylolisthesis of C2 and C3 measuring 7 mm. - At C6-7 disc bulging and facet hypertrophy with moderate to severe spinal stenosis and severe bilateral foraminal stenosis; no cord signal abnormalities. - At C2-3 pseudo disc bulging and facet hypertrophy with moderate spinal stenosis and severe bilateral foraminal stenosis; no cord signal abnormalities. - At C3-4 disc bulging with mild spinal stenosis and severe bilateral foraminal stenosis; no cord signal abnormalities.  COGNITION: Overall cognitive status: Within functional limits for tasks assessed   SENSATION: Decreased light touch sensation L lateral thigh, medial lower leg, and entire foot  COORDINATION: Decreased alternating toe taps L LE compared to R LE  EDEMA: (chronic L>R LE per pt report) Circumferential: malleolar line 27.5 cm R ankle; 28 cm L  ankle  POSTURE: rounded shoulders and forward head  LOWER EXTREMITY MMT:    MMT Right Eval Left Eval  Hip flexion 4+/5 4+/5  Hip extension    Hip abduction    Hip adduction    Hip internal rotation    Hip external rotation    Knee flexion    Knee extension 4+/5 4+/5  Ankle dorsiflexion 4+/5 2/5  Ankle plantarflexion At least 3/5 AROM At least 2/5 AROM  Ankle inversion    Ankle eversion    (Blank rows = not tested)  TRANSFERS: Sit to stand: Modified independence  Assistive device utilized: Environmental Consultant - 2 wheeled     Stand to  sit: Modified independence  Assistive device utilized: Environmental Consultant - 2 wheeled      GAIT: Findings: Gait Characteristics: narrow BOS; R LE ER>L LE; foot flat R LE; decreased L LE DF with forefoot initial WB'ing during initial contact L LE; increased L trunk lean; short steps, Distance walked: clinic distances, Assistive device utilized:Walker - 2 wheeled, Level of assistance: Modified independence, and Comments: decreased gait speed  FUNCTIONAL TESTS:  5 times sit to stand: 49.78 seconds with B UE support (up to RW); 35.47 seconds 11/28/24; 18.50 seconds with B UE support 12/28/24 10 meter walk test: 0.31 m/sec (31.63 seconds with RW use); 0.51 m/sec (19.35 seconds) with RW 12/07/24; 0.48 m/sec (20.62 seconds) 12/28/24 BERG Balance Test: 22/56 11/07/24; 41/56 12/28/24 Item T/est date: 11/07/24 Date:  Date:   Sitting to standing 3. able to stand independently using hands Insert SmartPhrase OPRCBERGREEVAL Insert SmartPhrase OPRCBERGREEVAL  2. Standing unsupported 3. able to stand 2 minutes with supervision    3. Sitting with back unsupported, feet supported 4. able to sit safely and securely for 2 minutes    4. Standing to sitting 3. controls descent by using hands    5. Pivot transfer  3. able to transfer safely with definite need of hands    6. Standing unsupported with eyes closed 3. able to stand 10 seconds with supervision    7. Standing unsupported with feet together  1. needs help to attain position but able to stand 15 seconds feet together    8. Reaching forward with outstretched arms while standing 1. reaches forward but needs supervision    9. Pick up object from the floor from standing 0. unable to try/needs assistance to keep from losing balance or falling; needs assist for balance    10. Turning to look behind over left and right shoulders while standing 1. needs supervision when turning    11. Turn 360 degrees 0. needs assistance while turning    12. Place alternate foot on step or stool while standing unsupported 0. needs assistance to keep from falling/unable to try; assist to keep balance with 2 attempts    13. Standing unsupported one foot in front 0. loses balance while stepping or standing    14. Standing on one leg 0. unable to try of needs assist to prevent fall ; need assist to maintain balance     Total Score 22/56 Total Score:    Total Score:      PATIENT SURVEYS:  TBA                                                                                                                             TREATMENT DATE: 12/28/24  Self Care: BP and HR taken in sitting at rest beginning of session (see below for details). Vitals:   12/28/24 1058  BP: 118/72  Pulse: (!) 55  Reviewed HEP (pt verbalizing and demonstrating appropriate understanding).  Therapeutic Activity: 5 time sit to stand:  18.50 seconds with B UE support :  0.48 m/sec (20.62 seconds) with RW use BERG Balance test: 41/56 (see below for detailed report) Item Test date: 12/28/24  Sitting to standing 3. able to stand independently using hands  2. Standing unsupported 4. able to stand safely for 2 minutes  3. Sitting with back unsupported, feet supported 4. able to sit safely and securely for 2 minutes  4. Standing to sitting 3. controls descent by using hands  5. Pivot transfer  4. able to transfer safely with minor use of hands  6. Standing unsupported with eyes closed 4.  able to stand 10 seconds safely  7. Standing unsupported with feet together 3. able to place feet together independently and stand 1 minute with supervision  8. Reaching forward with outstretched arms while standing 2. can reach forward 5 cm (2 inches)  9. Pick up object from the floor from standing 3. able to pick up slipper but needs supervision  10. Turning to look behind over left and right shoulders while standing 4. looks behind from both sides and weight shifts well  11. Turn 360 degrees 2. able to turn 360 degrees safely but slowly  12. Place alternate foot on step or stool while standing unsupported 2. able to complete 4 steps without aid with supervision; 8 steps with supervision  13. Standing unsupported one foot in front 2. able to take small step independently and hold 30 seconds  14. Standing on one leg 1. tries to lift leg unable to hold 3 seconds but remains standing independently.    Total Score 41/56   PATIENT EDUCATION: Education details: Continue HEP.  LTG/outcome measure results.  Plan for discharge/graduation from therapy today. Person educated: Patient Education method: Explanation, Demonstration, Verbal cues, and Handouts Education comprehension: verbalized understanding and needs further education  HOME EXERCISE PROGRAM: Access Code: [email or web address] URL: [email or web address] Date: 12/14/2024 Prepared by: Damien Caulk  Exercises - Seated March  - 1 x daily - 5 x weekly - 1-2 sets - 10 reps - Seated Long Arc Quad  - 1 x daily - 5 x weekly - 1-2 sets - 10 reps - Seated Hip Abduction with Resistance  - 1 x daily - 5 x weekly - 1-2 sets - 10 reps - Seated Heel Raise  - 1 x daily - 5 x weekly - 1-2 sets - 10 reps - Seated Toe Raise  - 1 x daily - 5 x weekly - 1-2 sets - 10 reps - Standing March with Counter Support  - 1 x daily - 3-5 x weekly - 2-3 sets - 10 reps - Standing Hip Abduction with Counter Support  - 1 x daily - 3-5 x weekly - 2-3 sets - 10  reps - Heel Raises with Counter Support  - 1 x daily - 3-5 x weekly - 2-3 sets - 10 reps - Standing Tandem Balance with Counter Support  - 1 x daily - 3-5 x weekly - 1 sets - 2-3 reps - 15-30 seconds hold  GOALS: Goals reviewed with patient? Yes  SHORT TERM GOALS: Target date: 11/28/2024  Pt will be independent with initial HEP in order to improve strength and balance in order to decrease fall risk and improve function at home for ADL's. Baseline: TBA Goal status: MET  2.  Assess BERG Balance Test and update LTG as needed. Baseline: 22/56 11/07/24 Goal status: MET  3.  Pt will decrease 5 Time Sit to Stand by at least 10 seconds in order  to demonstrate clinically significant improvement in LE strength. Baseline: 49.78 seconds (Eval); 35.47 seconds 11/28/24 Goal status: MET   LONG TERM GOALS: Target date: 12/26/2024  Pt will be independent with HEP in order to improve strength and balance in order to decrease fall risk and improve function at home for ADL's. Baseline: Issued Goal status: MET  2.  Patient will demonstrate an improved Berg Balance Score of 40 or greater as to demonstrate improved balance with ADLs and reduced fall risk. Baseline: 22/56 11/07/24; 41/56 12/28/24 Goal status: MET  3.  Pt will decrease 5 Time Sit to Stand by at least 20 seconds in order to demonstrate clinically significant improvement in LE strength. Baseline: 49.78 seconds (Eval); 35.47 seconds 11/28/24; 18.50 seconds with B UE support 12/28/24 Goal status: MET  4.  Pt will increase by at least 0.13 m/s in order to demonstrate clinically significant improvement in community ambulation.  Baseline: 0.31 m/sec on Eval; 0.51 m/sec 12/07/24; 0.48 m/sec (20.62 seconds) 12/28/24 with RW Goal status: MET  5.  Pt will demonstrate improved L LE foot clearance for safety with gait. Baseline: Decreased L foot clearance with RW use.  Improved L foot clearance 12/28/24. Goal status: MET  ASSESSMENT:  CLINICAL  IMPRESSION: Patient was seen today for physical therapy treatment to address LTG's and POC (today is pt's last scheduled PT appt and LTG's due; missed Monday's PT session d/t weather/clinic closed).  Focused session on assessing LTG's including outcome measures of 5 time sit to stand, , and BERG Balance test.  Pt scored 18.50 seconds on the 5 time sit to stand test indicating pt is at increased risk of falls (>15 seconds = increased risk of falls); improved from 49.78 seconds 10/31/24.  Pt scored 0.48 m/sec on the 10 Meter Walk Test indicating pt is a limited community ambulator (Cut off scores: <0.4 m/s = household Ambulator, 0.4-0.8 m/s = limited community Ambulator, >0.8 m/s = community Ambulator, >1.2 m/s = crossing a street, <1.0 = increased fall risk); improved from 0.31 m/sec 10/31/24.  Pt scored 41/56 on Berg Balance test indicating pt is at a moderate fall risk (<40/56 = high fall risk; 40-45/56 = moderate fall risk; >45/56 = low fall risk); improved from 22/56 11/07/24.  Pt has met 5/5 LTG's.  Discussed (with pt) pt's current status and results of LTG's/outcome measures.  Per discussion with pt, plan for discharge from OP PT today; pt verbalizing agreement.  Pt plans to continue with issued HEP and also [facility].  OBJECTIVE IMPAIRMENTS: Abnormal gait, cardiopulmonary status limiting activity, decreased activity tolerance, decreased balance, decreased coordination, decreased endurance, decreased knowledge of condition, decreased knowledge of use of DME, decreased mobility, difficulty walking, decreased ROM, decreased strength, increased edema, impaired flexibility, impaired sensation, improper body mechanics, postural dysfunction, and pain.   ACTIVITY LIMITATIONS: carrying, lifting, bending, standing, squatting, stairs, transfers, bed mobility, continence, toileting, dressing, locomotion level, and caring for others  PARTICIPATION LIMITATIONS: meal prep,  cleaning, laundry, shopping, community activity, and yard work  PERSONAL FACTORS: Age, Fitness, Past/current experiences, and 1-2 comorbidities: COPD, HFpEF, bradycardia, and memory loss are also affecting patient's functional outcome.   REHAB POTENTIAL: Good  CLINICAL DECISION MAKING: Evolving/moderate complexity  EVALUATION COMPLEXITY: Moderate  PLAN:  PT FREQUENCY: 2x/week  PT DURATION: 8 weeks  PLANNED INTERVENTIONS: 97164- PT Re-evaluation, 97750- Physical Performance Testing, 97110-Therapeutic exercises, 97530- Therapeutic activity, [id number]- Neuromuscular re-education, 97535- Self Care, 02859- Manual therapy, [id number]- Gait training, [phone]- Orthotic  Initial, 02236- Orthotic/Prosthetic subsequent, [id number]- Aquatic Therapy, [phone]- Electrical stimulation (manual), [id number]- Ultrasound, [id number]- Traction (mechanical), [phone] (1-2 muscles), [id number] (3+ muscles)- Dry Needling, Patient/Family education, Balance training, Stair training, Taping, Joint mobilization, Spinal mobilization, Compression bandaging, Cognitive remediation, DME instructions, Cryotherapy, Moist heat, and Biofeedback  PLAN FOR NEXT SESSION: Discharge from OP PT today.   Damien Caulk, PT 12/28/2024, 12:21 PM        "

## 2025-01-09 ENCOUNTER — Ambulatory Visit

## 2025-01-11 ENCOUNTER — Ambulatory Visit: Admitting: Podiatry

## 2025-01-16 ENCOUNTER — Ambulatory Visit

## 2025-01-26 ENCOUNTER — Ambulatory Visit

## 2025-01-30 ENCOUNTER — Ambulatory Visit: Admitting: Cardiology

## 2025-01-31 ENCOUNTER — Ambulatory Visit: Admitting: Allergy and Immunology

## 2025-04-19 ENCOUNTER — Other Ambulatory Visit (HOSPITAL_BASED_OUTPATIENT_CLINIC_OR_DEPARTMENT_OTHER)

## 2025-04-20 ENCOUNTER — Ambulatory Visit (INDEPENDENT_AMBULATORY_CARE_PROVIDER_SITE_OTHER)
